# Patient Record
Sex: Male | Born: 1945
Health system: Southern US, Community
[De-identification: ages and names within clinical notes are randomized; demographics above are authoritative.]

## PROBLEM LIST (undated history)

## (undated) DIAGNOSIS — G20A1 Parkinson's disease without dyskinesia, without mention of fluctuations: Secondary | ICD-10-CM

## (undated) DIAGNOSIS — G4730 Sleep apnea, unspecified: Secondary | ICD-10-CM

## (undated) DIAGNOSIS — T63301A Toxic effect of unspecified spider venom, accidental (unintentional), initial encounter: Secondary | ICD-10-CM

## (undated) DIAGNOSIS — C44300 Unspecified malignant neoplasm of skin of unspecified part of face: Secondary | ICD-10-CM

## (undated) DIAGNOSIS — N400 Enlarged prostate without lower urinary tract symptoms: Secondary | ICD-10-CM

## (undated) DIAGNOSIS — N35919 Unspecified urethral stricture, male, unspecified site: Secondary | ICD-10-CM

## (undated) DIAGNOSIS — G20 Parkinson's disease: Secondary | ICD-10-CM

## (undated) HISTORY — DX: Sleep apnea, unspecified: G47.30

## (undated) HISTORY — PX: TRANSURETHRAL RESECTION OF PROSTATE: SHX73

## (undated) HISTORY — DX: Toxic effect of unspecified spider venom, accidental (unintentional), initial encounter: T63.301A

## (undated) HISTORY — DX: Benign prostatic hyperplasia without lower urinary tract symptoms: N40.0

## (undated) HISTORY — PX: KNEE SURGERY: SHX244

## (undated) HISTORY — DX: Unspecified malignant neoplasm of skin of unspecified part of face: C44.300

## (undated) HISTORY — PX: COLONOSCOPY: SHX174

---

## 2006-08-18 ENCOUNTER — Encounter (INDEPENDENT_AMBULATORY_CARE_PROVIDER_SITE_OTHER): Payer: Self-pay | Admitting: Specialist

## 2006-08-18 ENCOUNTER — Ambulatory Visit (HOSPITAL_COMMUNITY): Admission: RE | Admit: 2006-08-18 | Discharge: 2006-08-19 | Payer: Self-pay | Admitting: Urology

## 2007-01-05 ENCOUNTER — Encounter: Admission: RE | Admit: 2007-01-05 | Discharge: 2007-01-05 | Payer: Self-pay | Admitting: Orthopaedic Surgery

## 2007-01-14 ENCOUNTER — Ambulatory Visit (HOSPITAL_BASED_OUTPATIENT_CLINIC_OR_DEPARTMENT_OTHER): Admission: RE | Admit: 2007-01-14 | Discharge: 2007-01-14 | Payer: Self-pay | Admitting: Urology

## 2008-01-09 ENCOUNTER — Encounter: Admission: RE | Admit: 2008-01-09 | Discharge: 2008-01-09 | Payer: Self-pay | Admitting: Orthopaedic Surgery

## 2009-12-26 ENCOUNTER — Encounter: Admission: RE | Admit: 2009-12-26 | Discharge: 2010-03-25 | Payer: Self-pay | Source: Home / Self Care

## 2010-01-03 ENCOUNTER — Ambulatory Visit: Payer: Self-pay | Admitting: Oncology

## 2010-03-13 ENCOUNTER — Ambulatory Visit: Payer: Self-pay | Admitting: Oncology

## 2010-08-04 ENCOUNTER — Other Ambulatory Visit: Payer: Self-pay | Admitting: Dermatology

## 2010-08-19 NOTE — Op Note (Signed)
NAMEKEANON, BEVINS                ACCOUNT NO.:  [id number]   MEDICAL RECORD NO.:  [id number]          PATIENT TYPE:  AMB   LOCATION:  DAY                          FACILITY:  [facility]   PHYSICIAN:  Bertram Millard. Dahlstedt, M.D.DATE OF BIRTH:  1946-03-25   DATE OF PROCEDURE:  08/18/2006  DATE OF DISCHARGE:                               OPERATIVE REPORT   PREOPERATIVE DIAGNOSIS:  Benign prostatic hypertrophy.   POSTOPERATIVE DIAGNOSIS:  Benign prostatic hypertrophy.   PRINCIPAL PROCEDURE:  Transurethral prostatectomy.   SURGEON:  Marcine Matar, MD.   ANESTHESIA:  General with LMA.   COMPLICATIONS:  None.   BRIEF HISTORY:  The patient is a 65 year old gentleman who presents for  definitive management of lower urinary tract symptoms. He has BPH. He  has been on maximum medical therapy, and despite this has significant  lower urinary tract symptomatology. The patient has had urodynamic  studies as well as an ultrasound biopsy of the prostate for an elevated  PSA. This was done back in March. That was negative.   At this point, with him having significant lower urinary tract symptoms,  as well as being on Avodart and Flomax, he requests further therapy to  ameliorate his lower urinary tract symptoms. The risks and complications  of TURP have been discussed with the patient at length. These include,  but are not limited to: Infection, bleeding, retrograde ejaculation,  bladder-neck contracture long term among others. He understands these  and desires to proceed.   DESCRIPTION OF PROCEDURE:  The patient was identified in the holding  area, IV antibiotics were administered, and he was taken to the  operating room where a general anesthetic was administered using the  LMA. He was placed in the dorsal lithotomy position. Genitalia and  perineum were prepped and draped. A 27-French resectoscope sheath was  then placed. Inspection of the bladder revealed no abnormalities.  Ureteral  orifices were carefully inspected. They were well away from the  bladder neck. He had significant bilobar hypertrophy with an overriding  left lobe. There was obvious obstruction.   A resection was begun at the 12 o'clock position, from the neck of the  bladder to the apex of the prostate tissue was resected. I then resected  the whole left lobe down to the surgical capsule. There were a  significant number of bleeders and these were carefully coagulated.  There was a significant amount of apical tissue on that left side as  well. This was taken down being careful to not resect the verumontanum -  - that was used as a landmark for resection. The right lobe was then  taken down from an anterior to posterior direction. There were some  venous sinuses on the right that I got into. These were controlled with  electrocautery. Resection was gain taken down to the surgical capsule.  Apical tissue was carefully resected anteriorly, laterally and  posteriorly. A wide-open fossa was then established.   The bleeders were carefully controlled using the electrocautery. Chips  were evacuated from the bladder, the bladder inspected and found to be  free of chips.  At this point, the fossa was inspected and found to be  hemostatic. The scope was removed, a 22-French Foley with a 30 mL  balloon (3-way) was placed. This was hooked to continuous bladder  irrigation.   The patient tolerated the procedure well. He was awakened and taken to  PACU in stable condition. Chips were sent for pathologic evaluation.      Bertram Millard. Dahlstedt, M.D.  Electronically Signed     SMD/MEDQ  D:  08/18/2006  T:  08/18/2006  Job:  [id number]   cc:   Loraine Leriche A. Perini, M.D.  Fax: [phone]

## 2010-08-19 NOTE — Op Note (Signed)
NAMECARLEN, FILS NO.:  [id number]   MEDICAL RECORD NO.:  [id number]          PATIENT TYPE:  AMB   LOCATION:  [facility]                         FACILITY:  [facility]   PHYSICIAN:  Bertram Millard. Dahlstedt, M.D.DATE OF BIRTH:  April 01, 1946   DATE OF PROCEDURE:  01/14/2007  DATE OF DISCHARGE:                               OPERATIVE REPORT   PREOPERATIVE DIAGNOSIS:  Urethral stricture.   POSTOPERATIVE DIAGNOSIS:  Urethral stricture.   PRINCIPAL PROCEDURE:  Cystoscopy, balloon dilatation of urethral  stricture.   SURGEON:  Bertram Millard. Dahlstedt, M.D.   ANESTHESIA:  General with LMA.   COMPLICATIONS:  None.   BRIEF HISTORY:  Gala Romney is a nice 65 year old gentleman status post TURP in  May of this year.  He has had worsening of his urinary stream since that  time as well as significant urgency, frequency and nocturia.  Recent  cystoscopy revealed a tight urethral stricture.  He presents at this  time for balloon dilatation of that.  Risks and complications have been  discussed with the patient, and he desires to proceed.   DESCRIPTION OF PROCEDURE:  Preoperative IV antibiotics were  administered, and the patient was taken to the operating room.  General  anesthetic was administered using LMA.  He was placed in the dorsal  lithotomy position.  His genitalia and perineum were prepped and draped.  A 22-French panendoscope was passed into his urethra.  There was a very  tight stricture in the bulbous urethra.  I was barely able to navigate a  guidewire through this into the bladder.  Once a good curl was seen in  the bladder with fluoroscopy, the nephrostomy track balloon dilator was  passed over the guidewire.  It was filled with water to 15 atmospheres  of pressure.  It was obviously placed across this, as the proximal end  of it was noted to be in the bladder.  The stricture was dilated for  approximately a minute at 15 atmospheres of pressure.  At this point,  the balloon was  deflated and cystoscopy revealed adequate dilatation.  There was no significant bleeding.  I then dilated the remaining urethra  to 30-French with Sissy Hoff sounds.  At this point, the bladder was  drained.  The procedure terminated.   The patient tolerated the procedure well.  He was awakened and taken to  PACU in stable condition.      Bertram Millard. Dahlstedt, M.D.  Electronically Signed     SMD/MEDQ  D:  01/14/2007  T:  01/15/2007  Job:  [id number]

## 2011-01-15 LAB — POCT HEMOGLOBIN-HEMACUE
Hemoglobin: 15.9
Operator id: 268271

## 2011-11-14 ENCOUNTER — Ambulatory Visit (INDEPENDENT_AMBULATORY_CARE_PROVIDER_SITE_OTHER): Payer: BC Managed Care – PPO | Admitting: Emergency Medicine

## 2011-11-14 VITALS — BP 122/72 | HR 64 | Temp 97.5°F | Resp 16 | Ht 70.0 in | Wt 167.0 lb

## 2011-11-14 DIAGNOSIS — T63331A Toxic effect of venom of brown recluse spider, accidental (unintentional), initial encounter: Secondary | ICD-10-CM

## 2011-11-14 DIAGNOSIS — T6391XA Toxic effect of contact with unspecified venomous animal, accidental (unintentional), initial encounter: Secondary | ICD-10-CM

## 2011-11-14 LAB — POCT CBC
Granulocyte percent: 77 %G (ref 37–80)
Hemoglobin: 15.6 g/dL (ref 14.1–18.1)
Lymph, poc: 1.2 (ref 0.6–3.4)
MCH, POC: 29.8 pg (ref 27–31.2)
MCV: 94.3 fL (ref 80–97)
POC Granulocyte: 6 (ref 2–6.9)
POC LYMPH PERCENT: 15.5 %L (ref 10–50)
Platelet Count, POC: 197 10*3/uL (ref 142–424)
RDW, POC: 14 %
WBC: 7.8 10*3/uL (ref 4.6–10.2)

## 2011-11-14 MED ORDER — SULFAMETHOXAZOLE-TRIMETHOPRIM 800-160 MG PO TABS: 1.0000 | ORAL_TABLET | Freq: Two times a day (BID) | ORAL | Status: AC

## 2011-11-14 NOTE — Progress Notes (Signed)
   Date:  11/14/2011   Name:  Ronald Noble   DOB:  23-Oct-1945   MRN:  [id number] Gender: male  Age: 66 y.o.  PCP:  No primary provider on file.    Chief Complaint: Insect Bite   History of Present Illness:  Ronald Noble is a 66 y.o. pleasant patient who presents with the following:  [place] and four hours later noted that he had an erythematous patch on his left medial lower leg, mid calf.  The lesion was not painful  By this morning the lesion had darkened and enlarged and is now  Approximately 3.5 x 3.5 inches in diameter and violaceous.  Not painful.  The patient is convinced that he was bitten by a brown recluse spider that has been seen in the area previously.  No systemic symptoms.  There is no problem list on file for this patient.   No past medical history on file.  No past surgical history on file.  History  Substance Use Topics  . Smoking status: Never Smoker   . Smokeless tobacco: Not on file  . Alcohol Use: Not on file    No family history on file.  No Known Allergies  Medication list has been reviewed and updated.  Current Outpatient Prescriptions on File Prior to Visit  Medication Sig Dispense Refill  . Carbidopa-Levodopa-Entacapone (STALEVO 100 PO) Take 100 mg by mouth 4 (four) times daily.      Marland Kitchen rOPINIRole (REQUIP XL) 8 MG 24 hr tablet Take 8 mg by mouth 2 (two) times daily.        Review of Systems:  As per HPI, otherwise negative.    Physical Examination: Filed Vitals:   11/14/11 1711  BP: 122/72  Pulse: 64  Temp: 97.5 F (36.4 C)  Resp: 16   Filed Vitals:   11/14/11 1711  Height: 5\' 10"  (1.778 m)  Weight: 167 lb (75.751 kg)   Body mass index is 23.96 kg/(m^2). Ideal Body Weight: Weight in (lb) to have BMI = 25: 173.9    GEN: WDWN, NAD, Non-toxic, Alert & Oriented x 3 HEENT: Atraumatic, Normocephalic.  Ears and Nose: No external deformity. EXTR: No  clubbing/cyanosis/edema NEURO: Normal gait.  PSYCH: Normally interactive. Conversant. Not depressed or anxious appearing.  Calm demeanor.  Leg:  Violaceous circular lesion on medial calf.  No blistering or necrosis.    Assessment and Plan: Manson Passey recluse spider bite Discussed with patient and wife at length the lack of specific treatment for this injury.  They will keep the leg elevated and follow up Monday.  Will start course of septra empirically.  Dr Cleta Alberts was consulted and concurred with the injury and the proposed management plan.  Explained the natural history of this injury may last months prior to resolution.  Carmelina Dane, MD

## 2011-11-15 LAB — COMPREHENSIVE METABOLIC PANEL
Alkaline Phosphatase: 45 U/L (ref 39–117)
BUN: 25 mg/dL — ABNORMAL HIGH (ref 6–23)
CO2: 29 mEq/L (ref 19–32)
Creat: 1.13 mg/dL (ref 0.50–1.35)
Potassium: 4.1 mEq/L (ref 3.5–5.3)
Sodium: 139 mEq/L (ref 135–145)
Total Bilirubin: 0.8 mg/dL (ref 0.3–1.2)

## 2011-11-16 ENCOUNTER — Encounter: Payer: Self-pay | Admitting: Emergency Medicine

## 2011-11-16 ENCOUNTER — Encounter (HOSPITAL_COMMUNITY): Payer: Self-pay | Admitting: Emergency Medicine

## 2011-11-16 ENCOUNTER — Emergency Department (HOSPITAL_COMMUNITY)
Admission: EM | Admit: 2011-11-16 | Discharge: 2011-11-16 | Disposition: A | Discharge status: Home / Self Care | Payer: BC Managed Care – PPO | Attending: Emergency Medicine | Admitting: Emergency Medicine

## 2011-11-16 DIAGNOSIS — G20A1 Parkinson's disease without dyskinesia, without mention of fluctuations: Secondary | ICD-10-CM | POA: Insufficient documentation

## 2011-11-16 DIAGNOSIS — T63301A Toxic effect of unspecified spider venom, accidental (unintentional), initial encounter: Secondary | ICD-10-CM

## 2011-11-16 DIAGNOSIS — S90569A Insect bite (nonvenomous), unspecified ankle, initial encounter: Secondary | ICD-10-CM | POA: Insufficient documentation

## 2011-11-16 DIAGNOSIS — G20 Parkinson's disease: Secondary | ICD-10-CM | POA: Insufficient documentation

## 2011-11-16 HISTORY — DX: Parkinson's disease without dyskinesia, without mention of fluctuations: G20.A1

## 2011-11-16 HISTORY — DX: Parkinson's disease: G20

## 2011-11-16 MED ORDER — BACITRACIN ZINC 500 UNIT/GM EX OINT
TOPICAL_OINTMENT | Freq: Two times a day (BID) | CUTANEOUS | Status: DC
  Administered 2011-11-16: 1 via TOPICAL

## 2011-11-16 NOTE — ED Notes (Signed)
Pt c/o spider bite to right lower leg x 4 days; increased bruising and discoloration with swelling to leg; pt from UC

## 2011-11-16 NOTE — ED Provider Notes (Signed)
History  This chart was scribed for Suzi Roots, MD by Bennett Scrape. This patient was seen in room TR11C/TR11C and the patient's care was started at 11:10AM.  CSN: [id number]  Arrival date & time 11/16/11  1054   None     Chief Complaint  Patient presents with  . Insect Bite     The history is provided by the patient. No language interpreter was used.    Ronald Noble is a 66 y.o. male who presents to the Emergency Department complaining of 4 days of sudden onset, gradually worsening, constant spider bite to the right lower leg that occurred in a cabin in [place]. He reports  bruising and discoloration had initially increased but constant in past 24 hours. He was seen at an urgent care, prescribed Bactrim.  Pt went to dermatologist today who indicated he did not take care of spider/insect bites and referred to ed. . He denies fever, chills, nausea and emesis as associated symptoms. He has a h/o Parkinson's disease. He is an occasional alcohol user but denies smoking.  Pt denies spreading redness, no fever or chills. Does not feel ill. No hx dm.    Past Medical History  Diagnosis Date  . Parkinson disease     History reviewed. No pertinent past surgical history.  History reviewed. No pertinent family history.  History  Substance Use Topics  . Smoking status: Never Smoker   . Smokeless tobacco: Not on file  . Alcohol Use: Yes     occasional      Review of Systems  Constitutional: Negative for fever and chills.  Gastrointestinal: Negative for nausea and vomiting.  Skin: Positive for wound (insect bite to the left lower leg).    Allergies  Review of patient's allergies indicates no known allergies.  Home Medications   Current Outpatient Rx  Name Route Sig Dispense Refill  . ASPIRIN 81 MG PO TABS Oral Take 81 mg by mouth every other day.     . OMEGA-3-ACID ETHYL ESTERS 1 G PO CAPS Oral Take 3 g by mouth daily.    Marland Kitchen PRESCRIPTION MEDICATION Oral Take 1  tablet by mouth daily. Statin.    Marland Kitchen RASAGILINE MESYLATE 0.5 MG PO TABS Oral Take 0.5 mg by mouth daily.    . SULFAMETHOXAZOLE-TRIMETHOPRIM 800-160 MG PO TABS Oral Take 1 tablet by mouth 2 (two) times daily. 20 tablet 0  . STALEVO 100 PO Oral Take 100 mg by mouth 4 (four) times daily.    Marland Kitchen VITAMIN D3 2000 UNITS PO CHEW Oral Chew 2,000 Units by mouth daily.    . B-12 PO Oral Take by mouth daily.    . OMEGA-3 FATTY ACIDS 1000 MG PO CAPS Oral Take 2 g by mouth 3 (three) times daily.    Marland Kitchen ROPINIROLE HCL ER 8 MG PO TB24 Oral Take 8 mg by mouth 2 (two) times daily.      Triage Vitals: BP 132/68  Pulse 71  Temp 98.5 F (36.9 C) (Oral)  Resp 16  SpO2 95%  Physical Exam  Nursing note and vitals reviewed. Constitutional: He is oriented to person, place, and time. He appears well-developed and well-nourished. No distress.  HENT:  Head: Normocephalic and atraumatic.  Eyes: EOM are normal.  Neck: Neck supple. No tracheal deviation present.  Cardiovascular: Normal rate.   Pulmonary/Chest: Effort normal. No respiratory distress.  Musculoskeletal: Normal range of motion.  Neurological: He is alert and oriented to person, place, and time.  Skin: Skin is  warm and dry.       Mildly indurated, erythematous, sl warm area to the left lower leg, medial aspect. Approximately 3-4 in diameter. Skin is intact. No ulcerations. No necrotic areas. No raised borders/areas to lesion. No lymphangitis. Distal pulses palp.   Psychiatric: He has a normal mood and affect. His behavior is normal.    ED Course  Procedures (including critical care time)  DIAGNOSTIC STUDIES: Oxygen Saturation is 95% on room air, adequate by my interpretation.    COORDINATION OF CARE: 11:32AM-Discussed treatment plan which includes with pt at bedside and pt agreed to plan. Advised pt to come back if skin begins to ulcerate or turn black.         MDM  I personally performed the services described in this documentation, which was  scribed in my presence. The recorded information has been reviewed and considered. Suzi Roots, MD  Currently no necrotic tissue or ulceration, skin intact. Discussed possible brown recluse bite vs other spider,insect. No other rash. No EM lesion, no tick bite. No spreading erythema/cellulitis, and currently on abx by urgent care md.  Discussed obs/expectant management, keeping skin very clean, return if worse, spreading redness. Reviewed pictures on pts/spouse phone from yesterday am, appears same/similar.    Suzi Roots, MD 11/16/11 [phone]

## 2011-11-23 ENCOUNTER — Encounter (HOSPITAL_BASED_OUTPATIENT_CLINIC_OR_DEPARTMENT_OTHER): Payer: BC Managed Care – PPO | Attending: Plastic Surgery

## 2011-11-23 DIAGNOSIS — L989 Disorder of the skin and subcutaneous tissue, unspecified: Secondary | ICD-10-CM | POA: Insufficient documentation

## 2011-11-23 DIAGNOSIS — Z7982 Long term (current) use of aspirin: Secondary | ICD-10-CM | POA: Insufficient documentation

## 2011-11-23 DIAGNOSIS — G20A1 Parkinson's disease without dyskinesia, without mention of fluctuations: Secondary | ICD-10-CM | POA: Insufficient documentation

## 2011-11-23 DIAGNOSIS — G20 Parkinson's disease: Secondary | ICD-10-CM | POA: Insufficient documentation

## 2011-11-23 DIAGNOSIS — Z79899 Other long term (current) drug therapy: Secondary | ICD-10-CM | POA: Insufficient documentation

## 2011-11-24 NOTE — Progress Notes (Signed)
[facility]  NAME:  Ronald Noble, Ronald Noble NO.:  [id number]  MEDICAL RECORD NO.:  [id number]      DATE OF BIRTH:  Aug 09, 1945  PHYSICIAN:  Wayland Denis, DO       VISIT DATE:  11/23/2011                                  OFFICE VISIT   HISTORY:  The patient is a 66 year old gentleman who is here for evaluation of a wound on his left lower extremity.  The patient states that 10 days ago, he was out side, had on long pants and socks, and felt something on his legs.  When he went to look at it, it was around a 3 x 3 cm area of red raised skin and he was concerned that it was a spider bite.  He sought out medical care and was given bacitracin and Bactrim. Over the past several days, it seems to have a stage through being red to purple to clearing up some over the past day or two.  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  MEDICATIONS:  Include aspirin, Lipitor, carbidopa, vitamin D3, Klonopin, vitamin B, fish oil, Azilect, Levitra, multivitamin, ReQuip, sulfamethoxazole, albendazole, and bacitracin.  ALLERGIES:  No known drug allergies.  PAST MEDICAL HISTORY:  He has early Parkinson's.  PAST SURGICAL HISTORY:  He has had prostate surgery and a left knee surgery.  FAMILY HISTORY:  He lives with his wife at home, not a smoker.  REVIEW OF SYSTEMS:  Otherwise negative.  PHYSICAL EXAMINATION:  GENERAL:  He is alert, oriented, cooperative, not in any acute distress.  He is pleasant. HEENT:  Pupils are equal.  External muscles intact.  No cervical lymphadenopathy. LUNGS:  Breathing is unlabored. HEART: Regular. ABDOMEN:  Soft.  ASSESSMENT AND PLAN:  The wound is described above.  There is actually no break in the skin.  There is a slight bit of redness and purple area, but overall seems to be healing quite well.  Based on the pictures that his wife had taken over the last several days, we will  continue with the bacitracin, Bactrim, elevation, anti-inflammatories, and have him follow up in my office in 1 week.     Wayland Denis, DO     CS/MEDQ  D:  11/23/2011  T:  11/24/2011  Job:  [id number]

## 2011-12-10 ENCOUNTER — Encounter: Payer: Self-pay | Admitting: Internal Medicine

## 2011-12-16 ENCOUNTER — Encounter: Payer: Self-pay | Admitting: Internal Medicine

## 2011-12-18 ENCOUNTER — Emergency Department (HOSPITAL_COMMUNITY)
Admission: EM | Admit: 2011-12-18 | Discharge: 2011-12-18 | Disposition: A | Discharge status: Home / Self Care | Payer: BC Managed Care – PPO | Source: Home / Self Care

## 2011-12-18 ENCOUNTER — Encounter (HOSPITAL_COMMUNITY): Payer: Self-pay | Admitting: Emergency Medicine

## 2011-12-18 DIAGNOSIS — H6690 Otitis media, unspecified, unspecified ear: Secondary | ICD-10-CM

## 2011-12-18 MED ORDER — AMOXICILLIN 500 MG PO CAPS: 500.0000 mg | ORAL_CAPSULE | Freq: Four times a day (QID) | ORAL | Status: AC

## 2011-12-18 NOTE — ED Notes (Signed)
C/o of bilateral ear pain since Tues. Seen at PMD given drops- no better

## 2011-12-21 ENCOUNTER — Encounter (HOSPITAL_BASED_OUTPATIENT_CLINIC_OR_DEPARTMENT_OTHER): Payer: BC Managed Care – PPO

## 2012-01-05 ENCOUNTER — Encounter: Payer: Self-pay | Admitting: Internal Medicine

## 2012-01-05 ENCOUNTER — Ambulatory Visit (AMBULATORY_SURGERY_CENTER): Payer: BC Managed Care – PPO

## 2012-01-05 VITALS — Ht 71.0 in | Wt 169.9 lb

## 2012-01-05 DIAGNOSIS — Z1211 Encounter for screening for malignant neoplasm of colon: Secondary | ICD-10-CM

## 2012-01-05 MED ORDER — MOVIPREP 100 G PO SOLR: ORAL | Status: DC

## 2012-01-13 ENCOUNTER — Telehealth: Payer: Self-pay | Admitting: Internal Medicine

## 2012-01-13 ENCOUNTER — Ambulatory Visit (AMBULATORY_SURGERY_CENTER): Payer: BC Managed Care – PPO | Admitting: Internal Medicine

## 2012-01-13 ENCOUNTER — Encounter: Payer: Self-pay | Admitting: Internal Medicine

## 2012-01-13 VITALS — BP 118/75 | HR 66 | Temp 97.4°F | Resp 74 | Ht 71.0 in | Wt 169.0 lb

## 2012-01-13 DIAGNOSIS — D126 Benign neoplasm of colon, unspecified: Secondary | ICD-10-CM

## 2012-01-13 DIAGNOSIS — Z1211 Encounter for screening for malignant neoplasm of colon: Secondary | ICD-10-CM

## 2012-01-13 MED ORDER — SODIUM CHLORIDE 0.9 % IV SOLN: 500.0000 mL | INTRAVENOUS | Status: DC

## 2012-01-13 NOTE — Progress Notes (Signed)
Pt given two fleets enemas per Dr Juanda Chance, pt states he had good results with enema.

## 2012-01-13 NOTE — Telephone Encounter (Signed)
Notified Dr.Brodie that patient called and states he drunk ALL the moviprep as directed,lastnight and this morning with the extra fluids and has not had BM since drinking this. He states his las BM was Monday. Verbal order given by Dr.Brodie for patient to take 1 bottle of Magnesium Citrate OTC. Notified patient and he states he will go get this now. Instructed patient to call us back to let us know if this starts working. He understands.

## 2012-01-13 NOTE — Progress Notes (Signed)
Pt encouraged to pass flatus.  Pt rolled to his right side and he is passing some flatus. Maw

## 2012-01-13 NOTE — Progress Notes (Signed)
Patient did not experience any of the following events: a burn prior to discharge; a fall within the facility; wrong site/side/patient/procedure/implant event; or a hospital transfer or hospital admission upon discharge from the facility. (G8907) Patient did not have preoperative order for IV antibiotic SSI prophylaxis. (G8918)  

## 2012-01-13 NOTE — Op Note (Signed)
[facility] [address] [place], [address]   COLONOSCOPY PROCEDURE REPORT  PATIENT: Ronald Noble, Ronald Noble  MR#: [id number] BIRTHDATE: December 22, 1945 , 65  yrs. old GENDER: Male ENDOSCOPIST: Hart Carwin, MD REFERRED BY:  Rodrigo Ran, M.D. PROCEDURE DATE:  01/13/2012 PROCEDURE:   Colonoscopy, screening ASA CLASS:   Class I INDICATIONS:average risk patient for colon cancer and last colon 2003 was normal. MEDICATIONS: MAC sedation, administered by CRNA and Propofol (Diprivan) 500 mg IV  DESCRIPTION OF PROCEDURE:   After the risks and benefits and of the procedure were explained, informed consent was obtained.  A digital rectal exam revealed no abnormalities of the rectum.    The LB [id number]-H180AL [id number]  endoscope was introduced through the anus and advanced to the cecum, which was identified by the ileocecal valve .  The quality of the prep was adequate, using Fleets Enemas .  The instrument was then slowly withdrawn as the colon was fully examined.     COLON FINDINGS: A normal appearing cecum, ileocecal valve, and appendiceal orifice were identified.  The ascending, hepatic flexure, transverse, splenic flexure, descending, sigmoid colon and rectum appeared unremarkable. Diminutive polyp at 5 cm- removed with cold biopsy  No  cancers were seen.     Retroflexed views revealed no abnormalities.     The scope was then withdrawn from the patient and the procedure completed.  COMPLICATIONS: There were no complications. ENDOSCOPIC IMPRESSION: Normal colon , long hypotonic colon, slightly dilated, difficult to traverse, suboptimal prep despite of Movie prep, Mag. citrate and Fleets enemax2, no obstruction diminutive polyp of the rectum removed  RECOMMENDATIONS: 1.  High fiber diet 2.   OTC laxatives prn ( Miralax 17 gm 3x/week)   REPEAT EXAM:   .  recall 5 years due to suboptimal visualization  cc:  _______________________________ eSigned:  Hart Carwin, MD  01/13/2012 4:13 PM     PATIENT NAME:  Ronald Noble, Ronald Noble MR#: [id number]

## 2012-01-13 NOTE — Patient Instructions (Addendum)
Handouts were given to your care partner on polyp and a high fiber diet.  Per Dr. Juanda Chance you may use over the counter laxatives such as miralax 17 gm 3 x per week.  You may resume your current medications today.  Please call if any questions or concerns.   YOU HAD AN ENDOSCOPIC PROCEDURE TODAY AT THE [facility]: Refer to the procedure report that was given to you for any specific questions about what was found during the examination.  If the procedure report does not answer your questions, please call your gastroenterologist to clarify.  If you requested that your care partner not be given the details of your procedure findings, then the procedure report has been included in a sealed envelope for you to review at your convenience later.  YOU SHOULD EXPECT: Some feelings of bloating in the abdomen. Passage of more gas than usual.  Walking can help get rid of the air that was put into your GI tract during the procedure and reduce the bloating. If you had a lower endoscopy (such as a colonoscopy or flexible sigmoidoscopy) you may notice spotting of blood in your stool or on the toilet paper. If you underwent a bowel prep for your procedure, then you may not have a normal bowel movement for a few days.  DIET: Your first meal following the procedure should be a light meal and then it is ok to progress to your normal diet.  A half-sandwich or bowl of soup is an example of a good first meal.  Heavy or fried foods are harder to digest and may make you feel nauseous or bloated.  Likewise meals heavy in dairy and vegetables can cause extra gas to form and this can also increase the bloating.  Drink plenty of fluids but you should avoid alcoholic beverages for 24 hours.  ACTIVITY: Your care partner should take you home directly after the procedure.  You should plan to take it easy, moving slowly for the rest of the day.  You can resume normal activity the day after the procedure however you should NOT  DRIVE or use heavy machinery for 24 hours (because of the sedation medicines used during the test).    SYMPTOMS TO REPORT IMMEDIATELY: A gastroenterologist can be reached at any hour.  During normal business hours, 8:30 AM to 5:00 PM Monday through Friday, call [phone].  After hours and on weekends, please call the GI answering service at [phone] who will take a message and have the physician on call contact you.   Following lower endoscopy (colonoscopy or flexible sigmoidoscopy):  Excessive amounts of blood in the stool  Significant tenderness or worsening of abdominal pains  Swelling of the abdomen that is new, acute  Fever of 100F or higher    FOLLOW UP: If any biopsies were taken you will be contacted by phone or by letter within the next 1-3 weeks.  Call your gastroenterologist if you have not heard about the biopsies in 3 weeks.  Our staff will call the home number listed on your records the next business day following your procedure to check on you and address any questions or concerns that you may have at that time regarding the information given to you following your procedure. This is a courtesy call and so if there is no answer at the home number and we have not heard from you through the emergency physician on call, we will assume that you have returned to your regular  daily activities without incident.  SIGNATURES/CONFIDENTIALITY: You and/or your care partner have signed paperwork which will be entered into your electronic medical record.  These signatures attest to the fact that that the information above on your After Visit Summary has been reviewed and is understood.  Full responsibility of the confidentiality of this discharge information lies with you and/or your care-partner.

## 2012-01-13 NOTE — Progress Notes (Signed)
17:33 The pt said he felt better and was ready to go home.  I encouraged him to keep passing flatus.  I advised him we would call him in the am to check on him. Maw

## 2012-01-13 NOTE — Progress Notes (Signed)
1448 the pt was taken to the rest room and I suggested I given him levsin or a rectal tube.  The pt declined and said he wanted to walk off the gas.  The pt was ambulated by Sherren Kerns, RN.  Two levsin were given sul.  Maw    Pt was taken back to the restroom.  He said he did pass a good amount of gas. Maw

## 2012-01-13 NOTE — Progress Notes (Signed)
The pt was taken to the restroom after 30 mins to see if he can pass more flatus. Maw

## 2012-01-13 NOTE — Telephone Encounter (Signed)
Patient was told to come in for enemas. Per verbal from Dr.Brodie.

## 2012-01-14 ENCOUNTER — Telehealth: Payer: Self-pay | Admitting: *Deleted

## 2012-01-14 NOTE — Telephone Encounter (Signed)
  Follow up Call-  Call back number 01/13/2012  Post procedure Call Back phone  # [phone]  Permission to leave phone message Yes     Patient questions:  Do you have a fever, pain , or abdominal swelling? no Pain Score  0 *  Have you tolerated food without any problems? yes  Have you been able to return to your normal activities? yes  Do you have any questions about your discharge instructions: Diet   no Medications  no Follow up visit  no  Do you have questions or concerns about your Care? no  Actions: * If pain score is 4 or above: No action needed, pain <4.

## 2012-01-19 ENCOUNTER — Encounter: Payer: Self-pay | Admitting: Internal Medicine

## 2012-02-02 ENCOUNTER — Other Ambulatory Visit: Payer: Self-pay | Admitting: Dermatology

## 2012-02-25 ENCOUNTER — Other Ambulatory Visit: Payer: Self-pay | Admitting: Dermatology

## 2012-03-23 DIAGNOSIS — R310 Gross hematuria: Secondary | ICD-10-CM | POA: Diagnosis not present

## 2012-03-23 DIAGNOSIS — N401 Enlarged prostate with lower urinary tract symptoms: Secondary | ICD-10-CM | POA: Diagnosis not present

## 2012-03-23 DIAGNOSIS — N320 Bladder-neck obstruction: Secondary | ICD-10-CM | POA: Diagnosis not present

## 2012-05-25 DIAGNOSIS — G20 Parkinson's disease: Secondary | ICD-10-CM | POA: Diagnosis not present

## 2012-05-25 DIAGNOSIS — G479 Sleep disorder, unspecified: Secondary | ICD-10-CM | POA: Diagnosis not present

## 2012-08-02 DIAGNOSIS — Z85828 Personal history of other malignant neoplasm of skin: Secondary | ICD-10-CM | POA: Diagnosis not present

## 2012-08-02 DIAGNOSIS — T148 Other injury of unspecified body region: Secondary | ICD-10-CM | POA: Diagnosis not present

## 2012-08-02 DIAGNOSIS — B351 Tinea unguium: Secondary | ICD-10-CM | POA: Diagnosis not present

## 2012-08-02 DIAGNOSIS — W57XXXA Bitten or stung by nonvenomous insect and other nonvenomous arthropods, initial encounter: Secondary | ICD-10-CM | POA: Diagnosis not present

## 2012-08-02 DIAGNOSIS — L570 Actinic keratosis: Secondary | ICD-10-CM | POA: Diagnosis not present

## 2012-08-02 DIAGNOSIS — L819 Disorder of pigmentation, unspecified: Secondary | ICD-10-CM | POA: Diagnosis not present

## 2012-08-02 DIAGNOSIS — L821 Other seborrheic keratosis: Secondary | ICD-10-CM | POA: Diagnosis not present

## 2012-08-02 DIAGNOSIS — L578 Other skin changes due to chronic exposure to nonionizing radiation: Secondary | ICD-10-CM | POA: Diagnosis not present

## 2012-08-08 ENCOUNTER — Telehealth: Payer: Self-pay | Admitting: Nurse Practitioner

## 2012-08-08 MED ORDER — CARBIDOPA-LEVODOPA-ENTACAPONE 25-100-200 MG PO TABS: 1.0000 | ORAL_TABLET | Freq: Every day | ORAL | Status: DC

## 2012-08-08 NOTE — Telephone Encounter (Signed)
Patient also requesting the Rx be sent to [facility] pharmacy.

## 2012-08-12 ENCOUNTER — Telehealth: Payer: Self-pay | Admitting: Neurology

## 2012-08-12 NOTE — Telephone Encounter (Signed)
Pt here for medication questions.  He received his medication,  stalevo 100 25-100-200 refill from pharmacy and he thought that this contained the increased dosing that last note from Dr. Sandria Manly stated he would change to on return.  Both he and I reviewed the medications and he is to continue the stalevo (6 x day)  as well as sinemet  10/100 (1/2 tab tid).  He has an appt on 09-27-12 with Dr. Frances Furbish and he will discuss with her then about his future dosing.  He has done well with the regime he is on now.

## 2012-09-27 ENCOUNTER — Encounter: Payer: Self-pay | Admitting: Neurology

## 2012-09-27 ENCOUNTER — Ambulatory Visit (INDEPENDENT_AMBULATORY_CARE_PROVIDER_SITE_OTHER): Payer: Medicare Other | Admitting: Neurology

## 2012-09-27 VITALS — BP 101/61 | HR 73 | Temp 97.8°F | Ht 70.5 in | Wt 163.0 lb

## 2012-09-27 DIAGNOSIS — G20 Parkinson's disease: Secondary | ICD-10-CM | POA: Diagnosis not present

## 2012-09-27 DIAGNOSIS — G20A1 Parkinson's disease without dyskinesia, without mention of fluctuations: Secondary | ICD-10-CM

## 2012-09-27 NOTE — Progress Notes (Signed)
Subjective:    Patient ID: Ronald Noble is a 67 y.o. male.  HPI  Interim history:   Ronald Noble is a 67 year old right-handed gentleman who presents for followup consultation of his Parkinson's disease. He is accompanied by his wife today. This is his first visit with me and he previously followed with Dr. Fayrene Fearing love and was last seen by him on 05/25/2012, at which time Dr. Sandria Manly felt he was stable. But because of of time at 2 hours he increased his carbidopa-levodopa by adding half of a 10-100 mg strength 3 times a day to his regimen. He recommended increasing his Stalevo to 125 mg from 100 mg 6 times a day. The patient has an underlying medical history of skin cancer including melanoma, enlarged prostate status post TURP. His current medications are Stalevo 100 mg 6 times a day, at 7:30, 10, 12:30, 3 PM, 5:30 PM and 8 PM. He is on clonazepam 0.5 mg at night, Requip long-acting 6 mg twice daily, rasagiline 1 mg once daily, carbidopa-levodopa 10-100 mg strength half a tablet 3 times a day.  I reviewed Dr. Imagene Gurney prior notes and the patient's records and below is a summary of that review:  67 year old right-handed gentleman followed since 2008 by Dr. love with a history of Parkinson's disease. He has a history of increased tone and slowness as well as tremor with left-sided predominance. He was initially placed on Requip XL and then rasagiline was added in October 2008. In February 2000 and Stalevo was added. He had low vitamin D levels in 2008. ANA, ESR, ceruloplasmin were normal except for slightly low ceruloplasmin of 16. Urine copper test was negative. Methylmalonic acid was normal. Urine for heavy metals is normal as well. MRI brain with and without contrast on 01/07/2007 was normal. He had a sleep study in October 2008 showing PLMs and evidence of RBD. Repeat sleep study in September 2011 showed supine obstructive sleep apnea and RBD. He had a CPAP titration study in late September 2011 which  was not very successful. He has been on clonazepam at night. He tries to exercise regularly. He has not noticed any compulsive behaviors, memory loss, depression, swelling, or orthostatic dizziness. He recently had a skin biopsy showing atypical cells, concerning for melanoma.  He works full-time as an Pensions consultant, [employer] traffic controller. He exercises regularly and does yoga.   His Past Medical History Is Significant For: Past Medical History  Diagnosis Date  . Parkinson disease   . Sleep apnea     mild  . BPH (benign prostatic hyperplasia)   . Skin cancer of face     arms and back  . Spider bite     brown recluse/ left ankle/08/13    His Past Surgical History Is Significant For: Past Surgical History  Procedure Laterality Date  . Transurethral resection of prostate      2008  . Knee surgery      exploratory/left  . Colonoscopy    . Skin cancer excision      face    His Family History Is Significant For: No family history on file.  His Social History Is Significant For: History   Social History  . Marital Status: Married    Spouse Name: N/A    Number of Children: N/A  . Years of Education: N/A   Social History Main Topics  . Smoking status: Never Smoker   . Smokeless tobacco: Never Used  . Alcohol Use: 3.0 oz/week    3 Glasses  of wine, 2 Cans of beer per week     Comment: occasional  . Drug Use: No  . Sexually Active: None   Other Topics Concern  . None   Social History Narrative  . None    His Allergies Are:  No Known Allergies:   His Current Medications Are:  Outpatient Encounter Prescriptions as of 09/27/2012  Medication Sig Dispense Refill  . aspirin EC 81 MG tablet Take 81 mg by mouth. Take one pill every 3-4 days      . carbidopa-levodopa-entacapone (STALEVO 100) 25-100-200 MG per tablet Take 1 tablet by mouth 6 (six) times daily.  540 tablet  1  . Cholecalciferol (VITAMIN D3) 2000 UNITS CHEW Chew 2,000 Units by mouth daily.      . clonazePAM  (KLONOPIN) 0.5 MG tablet Take 0.5 mg by mouth at bedtime.      . rasagiline (AZILECT) 0.5 MG TABS Take 0.5 mg by mouth daily.      Marland Kitchen rOPINIRole (REQUIP XL) 8 MG 24 hr tablet Take 8 mg by mouth. Take 2 tablets daily      . vardenafil (LEVITRA) 20 MG tablet Take 20 mg by mouth daily as needed.      Marland Kitchen atorvastatin (LIPITOR) 10 MG tablet Take 10 mg by mouth daily.      . Cyanocobalamin (B-12 PO) Take by mouth daily.      . halobetasol (ULTRAVATE) 0.05 % ointment       . Multiple Vitamin (MULTIVITAMIN) tablet Take 1 tablet by mouth daily.      Marland Kitchen neomycin-polymyxin-hydrocortisone (CORTISPORIN) 3.5-10000-1 otic suspension 3 drops 4 (four) times daily.      Marland Kitchen omega-3 acid ethyl esters (LOVAZA) 1 G capsule Take 2 g by mouth as needed.       . [DISCONTINUED] PRESCRIPTION MEDICATION Take 1 tablet by mouth daily. Statin.       No facility-administered encounter medications on file as of 09/27/2012.     Review of Systems  Constitutional: Positive for unexpected weight change (loss).  Respiratory:       Snoring  Psychiatric/Behavioral: Positive for sleep disturbance.       Racing thoughts    Objective:  Neurologic Exam  Physical Exam Physical Examination:   Filed Vitals:   09/27/12 1242  BP: 101/61  Pulse: 73  Temp:     General Examination: The patient is a very pleasant 67 y.o. male in no acute distress.  HEENT: Normocephalic, atraumatic, pupils are equal, round and reactive to light and accommodation. Extraocular tracking shows mild saccadic breakdown without nystagmus noted. There is no limitation to his gaze. There is mild decrease in eye blink rate. Hearing is intact. Face is symmetric with mild facial masking and normal facial sensation. There is no lip, neck or jaw tremor. Neck is moderately rigid with intact passive ROM. There are no carotid bruits on auscultation. Oropharynx exam reveals mild mouth dryness. No significant airway crowding is noted. Mallampati is class II. Tongue  protrudes centrally and palate elevates symmetrically.   There is slight drooling.   Chest: is clear to auscultation without wheezing, rhonchi or crackles noted.  Heart: sounds are regular and normal without murmurs, rubs or gallops noted.   Abdomen: is soft, non-tender and non-distended with normal bowel sounds appreciated on auscultation.  Extremities: There is no pitting edema in the distal lower extremities bilaterally.   Skin: is warm and dry with no trophic changes noted. Age-related changes are noted on the skin and scars from skin  cancer rem.   Musculoskeletal: exam reveals no obvious joint deformities, tenderness, joint swelling or erythema.  Neurologically:  Mental status: The patient is awake and alert, paying good  attention. He is able to completely provide the history. His wife provides additional details. He is oriented to: person, place, time/date, situation, day of week, month of year and year. His memory, attention, language and knowledge are intact. There is no aphasia, agnosia, apraxia or anomia. There is a no significant degree of bradyphrenia. Speech is mildly hypophonic with no dysarthria noted. Mood is congruent and affect is normal.   Cranial nerves are as described above under HEENT exam. In addition, shoulder shrug is normal with equal shoulder height noted.  Motor exam: Normal bulk, and strength for age is noted. There are no dyskinesias noted.   Tone is mildly rigid with presence of cogwheeling in the right upper extremity. There is overall mild bradykinesia. There is no drift or rebound.  There is a mild resting tremor in the right upper extremity. The tremor is intermittent.  Romberg is negative.  Reflexes are 1+ in the upper extremities and 1+ in the lower extremities. Toes are downgoing bilaterally.  Fine motor skills exam: Finger taps are moderately impaired on the right and mildly impaired on the left. Hand movements are mildly impaired on the right and  minimally impaired on the left. RAP (rapid alternating patting) is mildly impaired on the right and mildly impaired on the left. Foot taps are moderately impaired on the right and mildly impaired on the left. Foot agility (in the form of heel stomping) is moderately impaired on the right and mildly impaired on the left.    Cerebellar testing shows no dysmetria or intention tremor on finger to nose testing. Heel to shin is unremarkable bilaterally. There is no truncal or gait ataxia.   Sensory exam is intact to light touch, pinprick, vibration, temperature sense and proprioception in the upper and lower extremities.   Gait, station and balance: He stands up from the seated position with no significant  difficulty and does not need to push up with His hands. He needs no assistance. No veering to one side is noted. He is not noted to lean to the side. Posture is mildly stooped. Stance is narrow-based. He walks with normal pace, decrease in stride length decreased arm swing on the right. He turns in en bloc. Tandem walk is possible. Balance is minimally impaired. He is able to do a toe or heel stance.      Assessment and Plan:   Assessment and Plan:  In summary, Ronald Noble is a very pleasant 67 y.o.-year old male with a history of Parkinson's disease, right-sided predominant. His physical exam is stable and has not progressed in the last 4 months. He is doing fairly well at this time and I reassured the patient in that regard.  I had a long chat with the patient and his wife about His symptoms, my findings and the diagnosis of Parksinson's disease, its prognosis and treatment options. We talked about medical treatments and non-pharmacological approaches. We talked about maintaining a healthy lifestyle in general. I encouraged the patient to eat healthy, exercise daily and keep well hydrated, to keep a scheduled bedtime and wake time routine, to not skip any meals and eat healthy snacks in between  meals and to have protein with every meal. In particular, I stressed the importance of regular exercise, within of course the patient's own mobility limitations and reminded him  to stay well hydrated and not to overdo it.   As far as further diagnostic testing is concerned, I suggested: no change.  As far as medications are concerned, I recommended the following at this time: no change.  he can take carbidopa-levodopa 10-100 mg strength half a pill up to 6 times a day. I did ask him to discuss with his dermatologist the ongoing use of a dopamine agonist in light of his skin cancer history.  I answered all their questions today and the patient and his wife were in agreement with the above outlined plan. I would like to see the patient back in 3 months, sooner if the need arises and encouraged them to call with any interim questions, concerns, problems or updates and refill requests.

## 2012-09-27 NOTE — Patient Instructions (Addendum)
I think overall you are doing fairly well and are stable at this point.   I do have some generic suggestions for you today:   Please make sure that you drink plenty of fluids. I would like for you to exercise daily for example in the form of walking 20-30 minutes every day, if you can. Please keep a regular sleep-wake schedule, keep regular meal times, do not skip any meals, eat  healthy snacks in between meals, such as fruit or nuts. Try to eat protein with every meal.   As far as your medications are concerned, I would like to suggest: no changes. Discuss with your dermatologist the use of Requip with your history of skin cancer.    As far as diagnostic testing, I recommend: no new test.  I do not think we need to make any changes in your medications at this point. I think you're stable enough that I can see you back in 6 months, sooner if we need to. Please call us if you have any interim questions, concerns, or problems or updates to need to discuss.  Brett Canales is my clinical assistant and will answer any of your questions and relay your messages to me and will give you my messages.   Our phone number is [phone]. We also have an after hours call service for urgent matters and there is a physician on-call for urgent questions. For any emergencies you know to call 911 or go to the nearest emergency room.

## 2012-11-02 ENCOUNTER — Telehealth: Payer: Self-pay | Admitting: Neurology

## 2012-11-16 DIAGNOSIS — E559 Vitamin D deficiency, unspecified: Secondary | ICD-10-CM | POA: Diagnosis not present

## 2012-11-16 DIAGNOSIS — G20 Parkinson's disease: Secondary | ICD-10-CM | POA: Diagnosis not present

## 2012-11-16 DIAGNOSIS — Z125 Encounter for screening for malignant neoplasm of prostate: Secondary | ICD-10-CM | POA: Diagnosis not present

## 2012-11-16 DIAGNOSIS — E785 Hyperlipidemia, unspecified: Secondary | ICD-10-CM | POA: Diagnosis not present

## 2012-11-16 DIAGNOSIS — Z0000 Encounter for general adult medical examination without abnormal findings: Secondary | ICD-10-CM | POA: Diagnosis not present

## 2012-11-23 DIAGNOSIS — IMO0002 Reserved for concepts with insufficient information to code with codable children: Secondary | ICD-10-CM | POA: Diagnosis not present

## 2012-11-23 DIAGNOSIS — E785 Hyperlipidemia, unspecified: Secondary | ICD-10-CM | POA: Diagnosis not present

## 2012-11-23 DIAGNOSIS — E559 Vitamin D deficiency, unspecified: Secondary | ICD-10-CM | POA: Diagnosis not present

## 2012-11-23 DIAGNOSIS — Z23 Encounter for immunization: Secondary | ICD-10-CM | POA: Diagnosis not present

## 2012-11-23 DIAGNOSIS — N401 Enlarged prostate with lower urinary tract symptoms: Secondary | ICD-10-CM | POA: Diagnosis not present

## 2012-11-23 DIAGNOSIS — G4733 Obstructive sleep apnea (adult) (pediatric): Secondary | ICD-10-CM | POA: Diagnosis not present

## 2012-11-23 DIAGNOSIS — G20 Parkinson's disease: Secondary | ICD-10-CM | POA: Diagnosis not present

## 2012-11-23 DIAGNOSIS — Z832 Family history of diseases of the blood and blood-forming organs and certain disorders involving the immune mechanism: Secondary | ICD-10-CM | POA: Diagnosis not present

## 2012-11-23 DIAGNOSIS — Z1331 Encounter for screening for depression: Secondary | ICD-10-CM | POA: Diagnosis not present

## 2012-11-23 DIAGNOSIS — C801 Malignant (primary) neoplasm, unspecified: Secondary | ICD-10-CM | POA: Diagnosis not present

## 2012-11-23 DIAGNOSIS — Z0000 Encounter for general adult medical examination without abnormal findings: Secondary | ICD-10-CM | POA: Diagnosis not present

## 2012-11-29 DIAGNOSIS — B351 Tinea unguium: Secondary | ICD-10-CM | POA: Diagnosis not present

## 2012-11-29 DIAGNOSIS — L570 Actinic keratosis: Secondary | ICD-10-CM | POA: Diagnosis not present

## 2012-11-29 DIAGNOSIS — Z85828 Personal history of other malignant neoplasm of skin: Secondary | ICD-10-CM | POA: Diagnosis not present

## 2012-11-29 DIAGNOSIS — Q828 Other specified congenital malformations of skin: Secondary | ICD-10-CM | POA: Diagnosis not present

## 2012-11-29 DIAGNOSIS — D235 Other benign neoplasm of skin of trunk: Secondary | ICD-10-CM | POA: Diagnosis not present

## 2012-12-28 ENCOUNTER — Ambulatory Visit: Payer: Medicare Other | Admitting: Neurology

## 2013-01-02 DIAGNOSIS — N529 Male erectile dysfunction, unspecified: Secondary | ICD-10-CM | POA: Diagnosis not present

## 2013-01-02 DIAGNOSIS — N320 Bladder-neck obstruction: Secondary | ICD-10-CM | POA: Diagnosis not present

## 2013-01-02 DIAGNOSIS — N401 Enlarged prostate with lower urinary tract symptoms: Secondary | ICD-10-CM | POA: Diagnosis not present

## 2013-01-16 ENCOUNTER — Other Ambulatory Visit: Payer: Self-pay

## 2013-01-16 NOTE — Telephone Encounter (Signed)
Pharmacy sent a refill request for Klonopin.  Would you like to fill?  Thank you.

## 2013-01-18 MED ORDER — CLONAZEPAM 0.5 MG PO TABS: 0.5000 mg | ORAL_TABLET | Freq: Every day | ORAL | Status: DC

## 2013-01-18 NOTE — Telephone Encounter (Signed)
Rx signed and mailed.

## 2013-01-30 ENCOUNTER — Encounter: Payer: Self-pay | Admitting: Neurology

## 2013-01-30 ENCOUNTER — Ambulatory Visit (INDEPENDENT_AMBULATORY_CARE_PROVIDER_SITE_OTHER): Payer: Medicare Other | Admitting: Neurology

## 2013-01-30 ENCOUNTER — Telehealth: Payer: Self-pay | Admitting: *Deleted

## 2013-01-30 VITALS — BP 118/67 | HR 59 | Temp 98.1°F | Ht 70.5 in | Wt 159.0 lb

## 2013-01-30 DIAGNOSIS — G20 Parkinson's disease: Secondary | ICD-10-CM | POA: Diagnosis not present

## 2013-01-30 NOTE — Progress Notes (Signed)
Subjective:    Patient ID: Ronald Noble is a 67 y.o. male.  HPI  Interim history:   Mr. Swiatek is a 67 year old right-handed gentleman who presents for followup consultation of his L sided predominant Parkinson's disease. He is accompanied by his wife again today. I first met him on 09/27/2012, which time I did not make any changes to his medication and did not have any new test as he was stable. He recently was seen by Dr. Lorin Picket at [facility]. He has discussed DBS in the past and was not deemed a cadidate for it at the time as he was doing well. He was told by Dr. Lorin Picket to stay on schedule with his medication. 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 Sleep is an issue. He goes to sleep well, but wakes up after 45 minutes to 1 hour. He is seeing a urologist. He exercises regularly.   He previously followed with Dr. Avie Echevaria and was last seen by him on 05/25/2012, at which time Dr. Sandria Manly felt he was stable, but because of off time at 2 hours he increased his carbidopa-levodopa by adding half of a 10-100 mg strength 3 times a day to his regimen. He recommended increasing his Stalevo to 125 mg from 100 mg 6 times a day. The patient has an underlying medical history of skin cancer including melanoma, enlarged prostate status post TURP. His current medications are Stalevo 125 mg 6 times a day, at 7:30, 10, 12:30, 3 PM, 5:30 PM and 8 PM. He is on clonazepam 0.5 mg at night, Requip long-acting 6 mg twice daily, rasagiline 1 mg once daily, carbidopa-levodopa 10-100 mg strength half a tablet 3 times a day, 7:30 to 10, then midday and later in the evening.   He was followed by Dr. Sandria Manly since 2008 with a history of Parkinson's disease. He has a history of increased tone and slowness as well as tremor with R sided predominance. He was initially placed on Requip XL and then rasagiline was added in October 2008. In February 2010 Stalevo was added. He had low  vitamin D levels in 2008. ANA, ESR, ceruloplasmin were normal except for slightly low ceruloplasmin of 16. Urine copper test was negative. Methylmalonic acid was normal. Urine for heavy metals is normal as well. MRI brain with and without contrast on 01/07/2007 was normal. He had a sleep study in October 2008 showing PLMs and evidence of RBD. Repeat sleep study in September 2011 showed supine obstructive sleep apnea and RBD. He had a CPAP titration study in late September 2011 which was not very successful. He has been on clonazepam at night. He tries to exercise regularly. He has not noticed any compulsive behaviors, memory loss, depression, swelling, or orthostatic dizziness. He recently had a skin biopsy showing atypical cells, concerning for melanoma.  He works full-time as an Pensions consultant, Air traffic controller. He exercises regularly and does yoga.   His Past Medical History Is Significant For: Past Medical History  Diagnosis Date  . Parkinson disease   . Sleep apnea     mild  . BPH (benign prostatic hyperplasia)   . Skin cancer of face     arms and back  . Spider bite     brown recluse/ left ankle/08/13    His Past Surgical History Is Significant For: Past Surgical History  Procedure Laterality Date  . Transurethral resection of prostate  2008  . Knee surgery      exploratory/left  . Colonoscopy    . Skin cancer excision      face    His Family History Is Significant For: History reviewed. No pertinent family history.  His Social History Is Significant For: History   Social History  . Marital Status: Married    Spouse Name: N/A    Number of Children: N/A  . Years of Education: N/A   Social History Main Topics  . Smoking status: Never Smoker   . Smokeless tobacco: Never Used  . Alcohol Use: 3.0 oz/week    3 Glasses of wine, 2 Cans of beer per week     Comment: occasional  . Drug Use: No  . Sexual Activity: None   Other Topics Concern  . None   Social History  Narrative  . None    His Allergies Are:  No Known Allergies:   His Current Medications Are:  Outpatient Encounter Prescriptions as of 01/30/2013  Medication Sig Dispense Refill  . aspirin EC 81 MG tablet Take 81 mg by mouth. Take one pill every 3-4 days      . carbidopa-levodopa-entacapone (STALEVO 100) 25-100-200 MG per tablet Take 1 tablet by mouth 6 (six) times daily.  540 tablet  1  . Cholecalciferol (VITAMIN D3) 2000 UNITS CHEW Chew 2,000 Units by mouth daily.      . clonazePAM (KLONOPIN) 0.5 MG tablet Take 1 tablet (0.5 mg total) by mouth at bedtime.  90 tablet  0  . Cyanocobalamin (B-12 PO) Take by mouth daily.      . Multiple Vitamin (MULTIVITAMIN) tablet Take 1 tablet by mouth daily.      . rasagiline (AZILECT) 0.5 MG TABS Take 0.5 mg by mouth daily.      Marland Kitchen rOPINIRole (REQUIP XL) 8 MG 24 hr tablet Take 8 mg by mouth. Take 2 tablets daily      . vardenafil (LEVITRA) 20 MG tablet Take 20 mg by mouth daily as needed.      . [DISCONTINUED] carbidopa-levodopa (SINEMET IR) 10-100 MG per tablet Take 1 tablet by mouth 6 (six) times daily.      Marland Kitchen neomycin-polymyxin-hydrocortisone (CORTISPORIN) 3.5-10000-1 otic suspension 3 drops 4 (four) times daily.      Marland Kitchen omega-3 acid ethyl esters (LOVAZA) 1 G capsule Take 2 g by mouth as needed.       . [DISCONTINUED] atorvastatin (LIPITOR) 10 MG tablet Take 10 mg by mouth daily.      . [DISCONTINUED] halobetasol (ULTRAVATE) 0.05 % ointment        No facility-administered encounter medications on file as of 01/30/2013.   Review of Systems:   Out of a complete 14 point review of systems, all are reviewed and negative with the exception of these symptoms as listed below:   Review of Systems  Constitutional: Negative.   HENT: Negative.   Eyes: Negative.   Respiratory:       Snoring  Cardiovascular: Negative.   Gastrointestinal: Negative.   Endocrine: Negative.   Genitourinary: Negative.   Musculoskeletal: Negative.   Skin: Negative.    Allergic/Immunologic: Negative.   Neurological: Negative.   Hematological: Negative.   Psychiatric/Behavioral: Positive for sleep disturbance.    Objective:  Neurologic Exam  Physical Exam Physical Examination:   Filed Vitals:   01/30/13 1219  BP: 118/67  Pulse: 59  Temp: 98.1 F (36.7 C)   General Examination: The patient is a very pleasant 67 y.o. male in no  acute distress.   HEENT: Normocephalic, atraumatic, pupils are equal, round and reactive to light and accommodation. Extraocular tracking shows mild saccadic breakdown without nystagmus noted. There is no limitation to his gaze. There is mild decrease in eye blink rate. Hearing is intact. Face is symmetric with mild facial masking and normal facial sensation. There is no lip, neck or jaw tremor. Neck is moderately rigid with intact passive ROM. There are no carotid bruits on auscultation. Oropharynx exam reveals mild mouth dryness. No significant airway crowding is noted. Mallampati is class II. Tongue protrudes centrally and palate elevates symmetrically.   There is slight drooling. There are mild generalized dyskinesias.   Chest: is clear to auscultation without wheezing, rhonchi or crackles noted.  Heart: sounds are regular and normal without murmurs, rubs or gallops noted.   Abdomen: is soft, non-tender and non-distended with normal bowel sounds appreciated on auscultation.  Extremities: There is no pitting edema in the distal lower extremities bilaterally.   Skin: is warm and dry with no trophic changes noted. Age-related changes are noted on the skin and scars from skin cancer rem.   Musculoskeletal: exam reveals no obvious joint deformities, tenderness, joint swelling or erythema.  Neurologically:  Mental status: The patient is awake and alert, paying good  attention. He is able to completely provide the history. His wife provides additional details. He is oriented to: person, place, time/date, situation, day of  week, month of year and year. His memory, attention, language and knowledge are intact. There is no aphasia, agnosia, apraxia or anomia. There is a no significant degree of bradyphrenia. Speech is mildly hypophonic with no dysarthria noted. Mood is congruent and affect is normal.   Cranial nerves are as described above under HEENT exam. In addition, shoulder shrug is normal with equal shoulder height noted.  Motor exam: Normal bulk, and strength for age is noted. There are no dyskinesias noted.   Tone is mildly rigid with presence of cogwheeling in the right upper extremity. There is overall mild bradykinesia. There is no drift or rebound.  There is a mild resting tremor in the right upper extremity. The tremor is intermittent.  Romberg is negative.  Reflexes are 1+ in the upper extremities and 1+ in the lower extremities. Toes are downgoing bilaterally.  Fine motor skills exam: Finger taps are moderately impaired on the right and mildly impaired on the left. Hand movements are mildly impaired on the right and minimally impaired on the left. RAP (rapid alternating patting) is mildly impaired on the right and mildly impaired on the left. Foot taps are moderately impaired on the right and mildly impaired on the left. Foot agility (in the form of heel stomping) is moderately impaired on the right and mild to moderately impaired on the left.    Cerebellar testing shows no dysmetria or intention tremor on finger to nose testing. Heel to shin is unremarkable bilaterally. There is no truncal or gait ataxia.   Sensory exam is intact to light touch, pinprick, vibration, temperature sense in the upper and lower extremities.   Gait, station and balance: He stands up from the seated position with no significant difficulty and does not need to push up with His hands. He needs no assistance. No veering to one side is noted. He is not noted to lean to the side. Posture is mildly stooped, advanced for age. Stance is  narrow-based. He walks with normal pace, decrease in stride length decreased arm swing on the right. He turns in  en bloc. Tandem walk is possible. Balance is minimally impaired. He is able to do a toe or heel stance.     Assessment and Plan:   In summary, Dearion Huot is a very pleasant 67 year old male with a history of Parkinson's disease, right-sided predominant, with symptoms going back about 7 years. He has associated RBD and sleep disorder. His physical exam is stable and has progressed mildly in the last 4 months and then noted some mild dyskinesias today. Generally speaking he has been doing well. His memory is stable.  I again had a long chat with the patient and his wife about His symptoms, my findings and the diagnosis of Parksinson's disease, its prognosis and treatment options. We talked about medical treatments and non-pharmacological approaches. We talked about maintaining a healthy lifestyle in general. I encouraged the patient to eat healthy, exercise daily and keep well hydrated, to keep a scheduled bedtime and wake time routine, to not skip any meals and eat healthy snacks in between meals and to have protein with every meal. In particular, I stressed the importance of regular exercise, within of course the patient's own mobility limitations and reminded him to stay well hydrated and not to overdo exercising.   As far as further diagnostic testing is concerned, I suggested: no change. We talked at length about DBS today. While in do not think he needs to pursue this at this time, I think he may be a good candidate down the Road. I explained the procedure to him and the risks and side effects and limitations and expectations. He can discuss further details with Dr. Lorin Picket next time. As far as medications are concerned, I recommended the following at this time: no change. He can take carbidopa-levodopa 10-100 mg strength half a pill up to 3 times a day. I sees his dermatologist  regularly about every 3-4 months.  I answered all their questions today and the patient and his wife were in agreement with the above outlined plan. I would like to see the patient back in 6 months, sooner if the need arises and encouraged them to call with any interim questions, concerns, problems or updates and refill requests. He did not need any Rx today.

## 2013-01-30 NOTE — Patient Instructions (Addendum)
I think your Parkinson's disease has remained fairly stable, which is reassuring. Nevertheless, as you know, this disease does progress with time. It can affect your balance, your memory, your mood, your bowel and bladder function, your posture, balance and walking. Overall you are doing fairly well but I do want to suggest a few things today:  Remember to drink plenty of fluid, eat healthy meals and do not skip any meals. Try to eat protein with a every meal and eat a healthy snack such as fruit or nuts in between meals. Try to keep a regular sleep-wake schedule and try to exercise daily, particularly in the form of walking, 20-30 minutes a day, if you can.   Taking your medication on schedule is key.   Try to stay active physically and mentally. Engage in social activities in your community and with your family and try to keep up with current events by reading the newspaper or watching the news. Try to do word puzzles and you may like to do word puzzles and brain games on the computer such as on [email or web address]   As far as your medications are concerned, I would like to suggest that you take your current medication with the following additional changes: no changes.     As far as diagnostic testing, I will order: no new test.  I would like to see you back in 6 months, sooner if we need to. Please call us with any interim questions, concerns, problems, updates or refill requests.  Please also call us for any test results so we can go over those with you on the phone. Brett Canales is my clinical assistant and will answer any of your questions and relay your messages to me and also relay most of my messages to you.  Our phone number is [phone]. We also have an after hours call service for urgent matters and there is a physician on-call for urgent questions, that cannot wait till the next work day. For any emergencies you know to call 911 or go to the nearest emergency room.

## 2013-02-23 DIAGNOSIS — H04129 Dry eye syndrome of unspecified lacrimal gland: Secondary | ICD-10-CM | POA: Diagnosis not present

## 2013-02-23 DIAGNOSIS — H5200 Hypermetropia, unspecified eye: Secondary | ICD-10-CM | POA: Diagnosis not present

## 2013-02-23 DIAGNOSIS — H43819 Vitreous degeneration, unspecified eye: Secondary | ICD-10-CM | POA: Diagnosis not present

## 2013-02-23 DIAGNOSIS — H16219 Exposure keratoconjunctivitis, unspecified eye: Secondary | ICD-10-CM | POA: Diagnosis not present

## 2013-03-14 ENCOUNTER — Ambulatory Visit: Payer: Medicare Other | Admitting: Neurology

## 2013-03-20 DIAGNOSIS — H1030 Unspecified acute conjunctivitis, unspecified eye: Secondary | ICD-10-CM | POA: Diagnosis not present

## 2013-04-07 ENCOUNTER — Other Ambulatory Visit: Payer: Self-pay

## 2013-04-07 MED ORDER — CARBIDOPA-LEVODOPA-ENTACAPONE 25-100-200 MG PO TABS: 1.0000 | ORAL_TABLET | Freq: Every day | ORAL | Status: DC

## 2013-04-10 ENCOUNTER — Other Ambulatory Visit: Payer: Self-pay

## 2013-04-10 MED ORDER — CLONAZEPAM 0.5 MG PO TABS: 0.5000 mg | ORAL_TABLET | Freq: Every day | ORAL | Status: DC

## 2013-05-03 ENCOUNTER — Other Ambulatory Visit: Payer: Self-pay

## 2013-05-03 MED ORDER — CARBIDOPA-LEVODOPA-ENTACAPONE 25-100-200 MG PO TABS: 1.0000 | ORAL_TABLET | Freq: Every day | ORAL | Status: DC

## 2013-05-25 NOTE — Telephone Encounter (Signed)
Pt came in for apt. Closing encounter

## 2013-06-28 DIAGNOSIS — Z85828 Personal history of other malignant neoplasm of skin: Secondary | ICD-10-CM | POA: Diagnosis not present

## 2013-06-28 DIAGNOSIS — R21 Rash and other nonspecific skin eruption: Secondary | ICD-10-CM | POA: Diagnosis not present

## 2013-06-28 DIAGNOSIS — L259 Unspecified contact dermatitis, unspecified cause: Secondary | ICD-10-CM | POA: Diagnosis not present

## 2013-07-31 ENCOUNTER — Encounter: Payer: Self-pay | Admitting: Neurology

## 2013-07-31 ENCOUNTER — Ambulatory Visit (INDEPENDENT_AMBULATORY_CARE_PROVIDER_SITE_OTHER): Payer: Medicare Other | Admitting: Neurology

## 2013-07-31 VITALS — BP 108/60 | HR 66 | Temp 97.1°F | Ht 70.5 in | Wt 156.0 lb

## 2013-07-31 DIAGNOSIS — G20 Parkinson's disease: Secondary | ICD-10-CM

## 2013-07-31 NOTE — Progress Notes (Signed)
Subjective:    Patient ID: Ronald Noble is a 68 y.o. male.  HPI   Interim history:   Ronald Noble is a 68 year old right-handed gentleman with an underlying medical history of skin cancer including melanoma, enlarged prostate status post TURP who presents for followup consultation of his R sided predominant Parkinson's disease, complicated by RBD and sleep disturbance as well as mild dyskinesias. He is accompanied by his wife again today. I last saw him on 01/30/2013, at which time I felt he was stable. We talked about DBS surgery. I did not make any medication changes. He is aware of the increase risk of melanoma associated with DAs and his dermatologist is aware that he is taking Requip XL.   Today, he reports that he still works FT and he exercises regularly and this includes swimming, yoga, weights. He is due to see [name] once a year. He takes Stalevo 100 mg every 2 1/2 hours starting at 7:30 AM for 6 doses. He stopped the clonazepam, as he did feel some daytime sluggishness. He has not tried Melatonin. He takes C/L 1/2 tid in between. He has no cognitive issues, but does have some "downtime" midday.   I first met him on 09/27/2012, which time I did not make any changes to his medication and did not order any new test as he was stable. He saw [name] at [facility] and discussed DBS in the past but was not deemed a cadidate for it at the time as he was doing well. He was told by [name] to stay on schedule with his medication. 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 Sleep has been an issue. He goes to sleep well, but wakes up after 45 minutes to 1 hour. He is seeing a urologist. He exercises regularly.  He previously followed with [name] and was last seen by him on 05/25/2012, at which time [name] felt he was stable, but because of off time at 2 hours he increased his carbidopa-levodopa by adding half of a 10-100 mg strength 3 times a day to his regimen. He  recommended decreasing his Stalevo to 125 mg from 100 mg 6 times a day. He has been on Stalevo 125 mg 6 times a day, at 7:30, 10, 12:30, 3 PM, 5:30 PM and 8 PM. He is on clonazepam 0.5 mg at night, Requip long-acting 6 mg twice daily, rasagiline 1 mg once daily, carbidopa-levodopa 10-100 mg strength half a tablet 3 times a day, 7:30 to 10, then midday and later in the evening.  He was followed by [name] since 2008 with a history of Parkinson's disease. He has a history of increased tone and slowness as well as tremor with R sided predominance. He was initially placed on Requip XL and then rasagiline was added in October 2008. In February 2010 Stalevo was added. He had low vitamin D levels in 2008. ANA, ESR, ceruloplasmin were normal except for slightly low ceruloplasmin of 16. Urine copper test was negative. Methylmalonic acid was normal. Urine for heavy metals is normal as well. MRI brain with and without contrast on 01/07/2007 was normal. He had a sleep study in October 2008 showing PLMs and evidence of RBD. Repeat sleep study in September 2011 showed supine obstructive sleep apnea and RBD. He had a CPAP titration study in late September 2011 which was not very successful. He has been on clonazepam at night. He has not noticed  any compulsive behaviors, memory loss, depression, swelling, or orthostatic dizziness. He recently had a skin biopsy showing atypical cells, concerning for melanoma.  He works full-time as an Forensic psychologist, [employer]. He exercises regularly and does yoga.   His Past Medical History Is Significant For: Past Medical History  Diagnosis Date  . Parkinson disease   . Sleep apnea     mild  . BPH (benign prostatic hyperplasia)   . Skin cancer of face     arms and back  . Spider bite     brown recluse/ left ankle/08/13    His Past Surgical History Is Significant For: Past Surgical History  Procedure Laterality Date  . Transurethral resection of prostate      2008  .  Knee surgery      exploratory/left  . Colonoscopy    . Skin cancer excision      face    His Family History Is Significant For: No family history on file.  His Social History Is Significant For: History   Social History  . Marital Status: Married    Spouse Name: N/A    Number of Children: N/A  . Years of Education: N/A   Social History Main Topics  . Smoking status: Never Smoker   . Smokeless tobacco: Never Used  . Alcohol Use: 3.0 oz/week    3 Glasses of wine, 2 Cans of beer per week     Comment: occasional  . Drug Use: No  . Sexual Activity: None   Other Topics Concern  . None   Social History Narrative  . None    His Allergies Are:  No Known Allergies:   His Current Medications Are:  Outpatient Encounter Prescriptions as of 07/31/2013  Medication Sig  . aspirin EC 81 MG tablet Take 81 mg by mouth. Take one pill every 3-4 days  . carbidopa-levodopa (SINEMET IR) 10-100 MG per tablet Take 0.5 tablets by mouth daily.  . rasagiline (AZILECT) 0.5 MG TABS Take 0.5 mg by mouth daily.  Marland Kitchen rOPINIRole (REQUIP XL) 8 MG 24 hr tablet Take 8 mg by mouth. Take 2 tablets daily  . vardenafil (LEVITRA) 20 MG tablet Take 20 mg by mouth daily as needed.  . clonazePAM (KLONOPIN) 0.5 MG tablet Take 1 tablet (0.5 mg total) by mouth at bedtime.  . Multiple Vitamin (MULTIVITAMIN) tablet Take 1 tablet by mouth daily.  . [DISCONTINUED] carbidopa-levodopa-entacapone (STALEVO 100) 25-100-200 MG per tablet Take 1 tablet by mouth 6 (six) times daily.  . [DISCONTINUED] Cholecalciferol (VITAMIN D3) 2000 UNITS CHEW Chew 2,000 Units by mouth daily.  . [DISCONTINUED] Cyanocobalamin (B-12 PO) Take by mouth daily.  . [DISCONTINUED] neomycin-polymyxin-hydrocortisone (CORTISPORIN) 3.5-10000-1 otic suspension 3 drops 4 (four) times daily.  . [DISCONTINUED] omega-3 acid ethyl esters (LOVAZA) 1 G capsule Take 2 g by mouth as needed.   :  Review of Systems:  Out of a complete 14 point review of systems,  all are reviewed and negative with the exception of these symptoms as listed below:   Review of Systems  Constitutional: Positive for unexpected weight change.  HENT: Negative.   Eyes: Negative.   Respiratory: Negative.   Cardiovascular: Negative.   Gastrointestinal: Negative.   Endocrine: Negative.   Genitourinary: Negative.   Musculoskeletal: Negative.   Skin: Negative.   Allergic/Immunologic: Negative.   Neurological: Negative.   Hematological: Negative.   Psychiatric/Behavioral: Positive for sleep disturbance (insomnia, frequent waking, acting out dreams).    Objective:  Neurologic Exam  Physical Exam  Physical Examination:   Filed Vitals:   07/31/13 1208  BP: 108/60  Pulse: 66  Temp: 97.1 F (36.2 C)   General Examination: The patient is a very pleasant 68 y.o. male in no acute distress.   HEENT: Normocephalic, atraumatic, pupils are equal, round and reactive to light and accommodation. Extraocular tracking shows mild saccadic breakdown without nystagmus noted. There is no limitation to his gaze. There is mild decrease in eye blink rate. Hearing is intact. Face is symmetric with mild facial masking and normal facial sensation. There is no lip, neck or jaw tremor. Neck is moderately rigid with intact passive ROM. There are no carotid bruits on auscultation. Oropharynx exam reveals mild mouth dryness. No significant airway crowding is noted. Mallampati is class II. Tongue protrudes centrally and palate elevates symmetrically. There is slight drooling. There are mild generalized dyskinesias.   Chest: is clear to auscultation without wheezing, rhonchi or crackles noted.  Heart: sounds are regular and normal without murmurs, rubs or gallops noted.   Abdomen: is soft, non-tender and non-distended with normal bowel sounds appreciated on auscultation.  Extremities: There is no pitting edema in the distal lower extremities bilaterally.   Skin: is warm and dry with no trophic  changes noted. Age-related changes are noted on the skin and scars from skin cancer rem.   Musculoskeletal: exam reveals no obvious joint deformities, tenderness, joint swelling or erythema.  Neurologically:  Mental status: The patient is awake and alert, paying good  attention. He is able to completely provide the history. His wife provides additional details. He is oriented to: person, place, time/date, situation, day of week, month of year and year. His memory, attention, language and knowledge are intact. There is no aphasia, agnosia, apraxia or anomia. There is a no significant degree of bradyphrenia. Speech is mildly hypophonic with no dysarthria noted. Mood is congruent and affect is normal.   Cranial nerves are as described above under HEENT exam. In addition, shoulder shrug is normal with equal shoulder height noted.  Motor exam: Normal bulk, and strength for age is noted. There are no dyskinesias noted.   Tone is mildly rigid with presence of cogwheeling in the right upper extremity. There is overall mild bradykinesia. There is no drift or rebound.  There is a mild resting tremor in the right upper extremity. The tremor is intermittent.  Romberg is negative.  Reflexes are 1+ in the upper extremities and 1+ in the lower extremities. Toes are downgoing bilaterally.  Fine motor skills exam: Finger taps are moderately impaired on the right and mildly impaired on the left. Hand movements are mildly impaired on the right and minimally impaired on the left. RAP (rapid alternating patting) is mildly impaired on the right and mildly impaired on the left. Foot taps are moderately impaired on the right and mild to moderately impaired on the left. Foot agility (in the form of heel stomping) is moderately impaired on the right and mildly impaired on the left.    Cerebellar testing shows no dysmetria or intention tremor on finger to nose testing. Heel to shin is unremarkable bilaterally. There is no  truncal or gait ataxia.   Sensory exam is intact to light touch, pinprick, vibration, temperature sense in the upper and lower extremities.   Gait, station and balance: He stands up from the seated position with no significant difficulty and does not need to push up with His hands. He needs no assistance. No veering to one side is noted. He is  not noted to lean to the side. Posture is mildly stooped, advanced for age. Stance is narrow-based. He walks with normal pace, decrease in stride length decreased arm swing on the right. He turns in en bloc. Tandem walk is possible. Balance is minimally impaired. He is able to do a toe or heel stance.     Assessment and Plan:   In summary, Ronald Noble is a very pleasant 68 year old male with a history of Parkinson's disease, right-sided predominant, with symptoms going back about 7+ years. He has associated RBD and sleep disorder, in particular insomnia. His physical exam is fairly stable and he has mild dyskinesias today. Generally speaking he has been doing well. His memory is stable, but sleep is worse and he has lost a few pounds. I again had a long chat with the patient and his wife about His symptoms, my findings and the diagnosis of Parksinson's disease, its prognosis and treatment options. We talked about medical treatments and non-pharmacological approaches. We talked about maintaining a healthy lifestyle in general. I encouraged the patient to eat healthy, exercise daily and keep well hydrated, to keep a scheduled bedtime and wake time routine, to not skip any meals and eat healthy snacks in between meals and to have protein with every meal. In particular, I stressed the importance of regular exercise, within of course the patient's own mobility limitations and reminded him to stay well hydrated and not to overdo exercising. He is advised to reduce his chocolate intake, had more water, and more regular snacks during the day.  As far as further  diagnostic testing is concerned, I suggested: no change, with the exception of a trial of melatonin at night for sleep. He has an appointment with [name] coming up in June.  As far as medications are concerned, I recommended the following at this time: no change. He can continue to take carbidopa-levodopa 10-100 mg strength half a pill up to 3 times a day. I sees his dermatologist regularly about every 3-4 months and has a history of melanoma but has had reassuring checkups lately. The Requip has not only been helpful for his Parkinson's symptoms but also his restless leg symptoms. We talked about the potential risk of melanoma increase with any dopamine agonist. He's willing to continue with Requip XL, knowing that there is no guarantee that it may not increase his risk for melanoma recurrence. Unfortunately there is no other drug that would take the role of a dopamine agonist in his case. There is a choice we have to make sometimes knowing some of the known risks. He is already on Sinemet and rasagiline and Stalevo. His dermatologist is aware. I answered all their questions today and the patient and his wife were in agreement with the above outlined plan. I would like to see the patient back in 6 months, sooner if the need arises and encouraged them to call with any interim questions, concerns, problems or updates and refill requests. He did not need any Rx today.

## 2013-07-31 NOTE — Patient Instructions (Addendum)
I think your Parkinson's disease has remained fairly stable, which is reassuring. Nevertheless, as you know, this disease does progress with time. It can affect your balance, your memory, your mood, your bowel and bladder function, your posture, balance and walking. Overall you are doing fairly well but I do want to suggest a few things today:  Remember to drink plenty of fluid in the form of water, 6-8 glasses a day, eat healthy meals and do not skip any meals. Try to eat protein with a every meal and eat a healthy snack such as fruit or nuts in between meals. Try to keep a regular sleep-wake schedule and try to exercise daily, particularly in the form of walking, 20-30 minutes a day, if you can.   Taking your medication on schedule is key.   Check with Dr. Silvestre Mesi office regarding a nutritionist.   Try to stay active physically and mentally. Engage in social activities in your community and with your family and try to keep up with current events by reading the newspaper or watching the news. Try to do word puzzles and you may like to do word puzzles and brain games on the computer such as on [email or web address]   As far as your medications are concerned, I would like to suggest that you take your current medication with the following additional changes: no changes. You can try Melatonin at night for sleep: take 3 to 5 mg one hour before your bedtime.   As far as diagnostic testing, I will order: no new test.   I would like to see you back in 6 months, sooner if we need to. Please call us with any interim questions, concerns, problems, updates or refill requests.  Our nursing staff will answer any of your questions and relay your messages to me and also relay most of my messages to you.  Our phone number is [phone]. We also have an after hours call service for urgent matters and there is a physician on-call for urgent questions, that cannot wait till the next work day. For any emergencies you know  to call 911 or go to the nearest emergency room.

## 2013-08-30 DIAGNOSIS — Q828 Other specified congenital malformations of skin: Secondary | ICD-10-CM | POA: Diagnosis not present

## 2013-08-30 DIAGNOSIS — Z85828 Personal history of other malignant neoplasm of skin: Secondary | ICD-10-CM | POA: Diagnosis not present

## 2013-08-30 DIAGNOSIS — L259 Unspecified contact dermatitis, unspecified cause: Secondary | ICD-10-CM | POA: Diagnosis not present

## 2013-09-28 DIAGNOSIS — R5381 Other malaise: Secondary | ICD-10-CM | POA: Diagnosis not present

## 2013-09-28 DIAGNOSIS — T148 Other injury of unspecified body region: Secondary | ICD-10-CM | POA: Diagnosis not present

## 2013-09-28 DIAGNOSIS — R079 Chest pain, unspecified: Secondary | ICD-10-CM | POA: Diagnosis not present

## 2013-09-28 DIAGNOSIS — W57XXXA Bitten or stung by nonvenomous insect and other nonvenomous arthropods, initial encounter: Secondary | ICD-10-CM | POA: Diagnosis not present

## 2013-09-28 DIAGNOSIS — IMO0002 Reserved for concepts with insufficient information to code with codable children: Secondary | ICD-10-CM | POA: Diagnosis not present

## 2013-09-28 DIAGNOSIS — R5383 Other fatigue: Secondary | ICD-10-CM | POA: Diagnosis not present

## 2013-10-04 ENCOUNTER — Telehealth: Payer: Self-pay | Admitting: Neurology

## 2013-10-04 NOTE — Telephone Encounter (Signed)
I called Mr. Ronald Noble and talked to him at length about his recent lyme test results. He has had a long-standing history of tick exposure and while he does not recall a recent tick bite, his dermatologist has seen ticks on him maybe as recent as a year ago. It looks like from the test results that he may actually be having an acute lyme infection for which his primary care physician has put him on oral doxycycline, which pt started 2 days ago. I explained to him that Lyme disease has typically different stages out of which neuroborreliosis is a late stage and occurs months after the exposure and the initial infection. Judging from his IgG antibodies, it does not look like he has had previous infections with only 3/10 IgG Abx positive and diagnostic criteria is 5/10 to be positive.  He may be going through an acute infection at this time. I would not push for CSF testing because the likelihood from lyme to show in the CSF at this stage is very low. I do not think it justifies an invasive test. To be absolutely clear as to what we are dealing with we can certainly request input from infectious diseases but personally I believe he is under appropriate treatment and would benefit from rechecking his Lyme titers in about 3 months from the original testing and after treatment is completed. At that point, his IgG antibodies should be positive and his IgM should have converted to IgG. As far as criteria for acute infection I believe he fulfills criteria for an acute infection, as far as criteria for chronic infection, he at this point does not fulfill criteria for chronic infection. The patient demonstrated understanding and agreement. He declined referral to infectious diseases at this time and is satisfied with current treatment plan with Dr. Joylene Draft.

## 2013-10-04 NOTE — Telephone Encounter (Signed)
Patient has questions about lab results that were sent to [place] from Dr. Abner Greenspan regarding Lyme disease--please call patient.

## 2013-10-04 NOTE — Telephone Encounter (Signed)
Pt information was given to Dr. Rexene Alberts. Please advise previous note. Thanks

## 2013-11-27 ENCOUNTER — Other Ambulatory Visit: Payer: Self-pay | Admitting: Dermatology

## 2013-11-27 DIAGNOSIS — L565 Disseminated superficial actinic porokeratosis (DSAP): Secondary | ICD-10-CM | POA: Diagnosis not present

## 2013-11-27 DIAGNOSIS — L570 Actinic keratosis: Secondary | ICD-10-CM | POA: Diagnosis not present

## 2013-11-27 DIAGNOSIS — C44319 Basal cell carcinoma of skin of other parts of face: Secondary | ICD-10-CM | POA: Diagnosis not present

## 2013-11-27 DIAGNOSIS — D1801 Hemangioma of skin and subcutaneous tissue: Secondary | ICD-10-CM | POA: Diagnosis not present

## 2013-11-27 DIAGNOSIS — B360 Pityriasis versicolor: Secondary | ICD-10-CM | POA: Diagnosis not present

## 2013-11-27 DIAGNOSIS — Z85828 Personal history of other malignant neoplasm of skin: Secondary | ICD-10-CM | POA: Diagnosis not present

## 2013-11-27 DIAGNOSIS — D239 Other benign neoplasm of skin, unspecified: Secondary | ICD-10-CM | POA: Diagnosis not present

## 2013-12-05 DIAGNOSIS — N401 Enlarged prostate with lower urinary tract symptoms: Secondary | ICD-10-CM | POA: Diagnosis not present

## 2013-12-05 DIAGNOSIS — E785 Hyperlipidemia, unspecified: Secondary | ICD-10-CM | POA: Diagnosis not present

## 2013-12-05 DIAGNOSIS — N138 Other obstructive and reflux uropathy: Secondary | ICD-10-CM | POA: Diagnosis not present

## 2013-12-05 DIAGNOSIS — W57XXXA Bitten or stung by nonvenomous insect and other nonvenomous arthropods, initial encounter: Secondary | ICD-10-CM | POA: Diagnosis not present

## 2013-12-05 DIAGNOSIS — E559 Vitamin D deficiency, unspecified: Secondary | ICD-10-CM | POA: Diagnosis not present

## 2013-12-05 DIAGNOSIS — T148 Other injury of unspecified body region: Secondary | ICD-10-CM | POA: Diagnosis not present

## 2013-12-05 DIAGNOSIS — Z125 Encounter for screening for malignant neoplasm of prostate: Secondary | ICD-10-CM | POA: Diagnosis not present

## 2013-12-14 DIAGNOSIS — Z0000 Encounter for general adult medical examination without abnormal findings: Secondary | ICD-10-CM | POA: Diagnosis not present

## 2013-12-14 DIAGNOSIS — R634 Abnormal weight loss: Secondary | ICD-10-CM | POA: Diagnosis not present

## 2013-12-14 DIAGNOSIS — N401 Enlarged prostate with lower urinary tract symptoms: Secondary | ICD-10-CM | POA: Diagnosis not present

## 2013-12-14 DIAGNOSIS — E785 Hyperlipidemia, unspecified: Secondary | ICD-10-CM | POA: Diagnosis not present

## 2013-12-14 DIAGNOSIS — G4733 Obstructive sleep apnea (adult) (pediatric): Secondary | ICD-10-CM | POA: Diagnosis not present

## 2013-12-14 DIAGNOSIS — Z1331 Encounter for screening for depression: Secondary | ICD-10-CM | POA: Diagnosis not present

## 2013-12-14 DIAGNOSIS — N138 Other obstructive and reflux uropathy: Secondary | ICD-10-CM | POA: Diagnosis not present

## 2013-12-14 DIAGNOSIS — A6920 Lyme disease, unspecified: Secondary | ICD-10-CM | POA: Diagnosis not present

## 2013-12-14 DIAGNOSIS — G20 Parkinson's disease: Secondary | ICD-10-CM | POA: Diagnosis not present

## 2013-12-15 DIAGNOSIS — Z1212 Encounter for screening for malignant neoplasm of rectum: Secondary | ICD-10-CM | POA: Diagnosis not present

## 2013-12-17 ENCOUNTER — Encounter: Payer: Self-pay | Admitting: Neurology

## 2013-12-18 ENCOUNTER — Telehealth: Payer: Self-pay | Admitting: Neurology

## 2013-12-18 NOTE — Telephone Encounter (Signed)
Patient calling to speak with a nurse regarding his change of medication and change in condition, please return call and advise.

## 2013-12-18 NOTE — Telephone Encounter (Signed)
Pt calling having some issues with worsening PD.  (medications not working as well as before).   Made RV for tomorrow at 1200.

## 2013-12-19 ENCOUNTER — Ambulatory Visit (INDEPENDENT_AMBULATORY_CARE_PROVIDER_SITE_OTHER): Payer: Medicare Other | Admitting: Neurology

## 2013-12-19 ENCOUNTER — Encounter: Payer: Self-pay | Admitting: Neurology

## 2013-12-19 VITALS — BP 100/60 | HR 58 | Temp 97.7°F | Ht 71.0 in | Wt 155.0 lb

## 2013-12-19 DIAGNOSIS — R894 Abnormal immunological findings in specimens from other organs, systems and tissues: Secondary | ICD-10-CM | POA: Diagnosis not present

## 2013-12-19 DIAGNOSIS — R634 Abnormal weight loss: Secondary | ICD-10-CM | POA: Diagnosis not present

## 2013-12-19 DIAGNOSIS — G20 Parkinson's disease: Secondary | ICD-10-CM | POA: Diagnosis not present

## 2013-12-19 DIAGNOSIS — R768 Other specified abnormal immunological findings in serum: Secondary | ICD-10-CM

## 2013-12-19 MED ORDER — CARBIDOPA-LEVODOPA 25-100 MG PO TBDP: ORAL_TABLET | ORAL | Status: DC

## 2013-12-19 NOTE — Patient Instructions (Addendum)
1. Take your Requip XL 8 mg: 2 pills once daily at bedtime, and try this for about 2 weeks, and may try to switch to 8 AM if not better after 2 weeks.  2. Take the Stalevo at the current times.  3. Take Azilect 1 mg at 7:30 AM daily.  4. We will add Parcopa 25/100 mg 1/2 pill at 11 AM, 2 PM and 7 PM daily for now.  5. Referral to [facility] d/o group, [name] for DBS candidacy.  6. Please stop the Protein supplement/milkshake.  7. You can try Melatonin at night for sleep: take 3 to 5 mg one to 2 hours before your bedtime.

## 2013-12-19 NOTE — Progress Notes (Signed)
Subjective:    Patient ID: Ronald Noble is a 68 y.o. male.  HPI    Interim history:   Ronald Noble is a 67-year-oqld right-handed gentleman with an underlying medical history of skin cancer including melanoma, enlarged prostate status post TURP who presents for followup consultation of his R sided predominant Parkinson's disease, complicated by RBD and sleep disturbance as well as mild dyskinesias. He is accompanied by his wife again today. I last saw him on 07/31/13, at which time [name], including swimming, yoga, and weights. He has been on Stalevo 100 mg every 2 1/2 hours starting at 7:30 AM for 6 doses. He stopped clonazepam, as he felt some daytime sluggishness. He has been taking C/L 1/2 tid in between. He had no cognitive issues, but did have some "downtime" midday. I did not change his medications. He had an appointment with [name] at [facility] in June, but there was a mix up with his appointment and he does not have an appointment until next year.  In the interim, he was diagnosed with Lyme d/s in July. He was tested and treated by his PCP has has been referred to an infectious d/s doctor. He brings in his latest lying test results from 12/05/2013 which I reviewed: It looks like his IgM antibody status is negative but his IgG antibody status has not quite turn positive as far as I understand the report. That means he is probably in the subacute or early chronic phase if I interpret this correctly. He requested a sooner than scheduled appointment, due to feeling more freezing and more off time. This starts in the late morning hours but can also go into the afternoon after lunch. Cognitively and mood wise he feels stable. He does not sleep well at night. He has not consistently tried melatonin I recommended.  Today, he reports that in the last few weeks he felt that his medications not working as well. He has less on time and more off  time and more freezing episodes. Sometimes his facial expression is really decreased and his wife is concerned. She also points out that he has started a new protein milkshake that he drinks first thing in the morning. She shows me the ingredients on the picture she took from the bottle. The first reading as rice protein and is supplemented with a lot of vitamins. He has lost weight in the realm of 25 pounds altogethe in the past several months. In an effort to improve his weight he has added this protein milkshake powder. He reports otherwise no changes in his lifestyle or his medications. His primary care physician has made a referral to a nutritionist as well. He is wondering if he can see another neurologist for DBS evaluation. We have talked about this before.  I saw him on 01/30/2013, at which time I felt he was stable. We talked about DBS surgery. I did not make any medication changes. He is aware of the increase risk of melanoma associated with DAs and his dermatologist is aware that he is taking Requip XL.  I first met him on 09/27/2012, which time I did not make any changes to his medication and did not order any new test as he was stable. He saw [name] at [facility] and discussed DBS in the past but was not deemed a cadidate for it at the time as he was doing well. He was told by [name] to  stay on schedule with his medication. 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 Sleep has been an issue. He goes to sleep well, but wakes up after 45 minutes to 1 hour. He is seeing a urologist. He exercises regularly.  He previously followed with [name] and was last seen by him on 05/25/2012, at which time [name] felt he was stable, but because of off time at 2 hours he increased his carbidopa-levodopa by adding half of a 10-100 mg strength 3 times a day to his regimen. He recommended decreasing his Stalevo to 125 mg from 100 mg 6 times a day. He has been on Stalevo 125 mg 6 times  a day, at 7:30, 10, 12:30, 3 PM, 5:30 PM and 8 PM. He is on clonazepam 0.5 mg at night, Requip long-acting 6 mg twice daily, rasagiline 1 mg once daily, carbidopa-levodopa 10-100 mg strength half a tablet 3 times a day, 7:30 to 10, then midday and later in the evening.  He was followed by [name] since 2008 with a history of Parkinson's disease. He has a history of increased tone and slowness as well as tremor with R sided predominance. He was initially placed on Requip XL and then rasagiline was added in October 2008. In February 2010 Stalevo was added. He had low vitamin D levels in 2008. ANA, ESR, ceruloplasmin were normal except for slightly low ceruloplasmin of 16. Urine copper test was negative. Methylmalonic acid was normal. Urine for heavy metals is normal as well. MRI brain with and without contrast on 01/07/2007 was normal. He had a sleep study in October 2008 showing PLMs and evidence of RBD. Repeat sleep study in September 2011 showed supine obstructive sleep apnea and RBD. He had a CPAP titration study in late September 2011 which was not very successful. He has been on clonazepam at night. He has not noticed any compulsive behaviors, memory loss, depression, swelling, or orthostatic dizziness. He recently had a skin biopsy showing atypical cells, concerning for melanoma.  He works full-time as an Forensic psychologist, [employer]. He exercises regularly and does yoga.   His Past Medical History Is Significant For: Past Medical History  Diagnosis Date  . Parkinson disease   . Sleep apnea     mild  . BPH (benign prostatic hyperplasia)   . Skin cancer of face     arms and back  . Spider bite     brown recluse/ left ankle/08/13    His Past Surgical History Is Significant For: Past Surgical History  Procedure Laterality Date  . Transurethral resection of prostate      2008  . Knee surgery      exploratory/left  . Colonoscopy    . Skin cancer excision      face    His Family  History Is Significant For: No family history on file.  His Social History Is Significant For: History   Social History  . Marital Status: Married    Spouse Name: Levada Dy    Number of Children: 4  . Years of Education: 19   Occupational History  . LAWYER    Social History Main Topics  . Smoking status: Never Smoker   . Smokeless tobacco: Never Used  . Alcohol Use: 3.0 oz/week    3 Glasses of wine, 2 Cans of beer per week     Comment: occasional  . Drug Use: No  . Sexual Activity: None  Other Topics Concern  . None   Social History Narrative   Patient is right handed and resides with wife    His Allergies Are:  No Known Allergies:   His Current Medications Are:  Outpatient Encounter Prescriptions as of 12/19/2013  Medication Sig  . aspirin EC 81 MG tablet Take 81 mg by mouth. Take one pill every 3-4 days  . carbidopa-levodopa (SINEMET IR) 10-100 MG per tablet Take 0.5 tablets by mouth daily.  . clonazePAM (KLONOPIN) 0.5 MG tablet Take 1 tablet (0.5 mg total) by mouth at bedtime.  . Multiple Vitamin (MULTIVITAMIN) tablet Take 1 tablet by mouth daily.  . rasagiline (AZILECT) 0.5 MG TABS Take 0.5 mg by mouth daily.  Marland Kitchen rOPINIRole (REQUIP XL) 8 MG 24 hr tablet Take 8 mg by mouth. Take 2 tablets daily  . vardenafil (LEVITRA) 20 MG tablet Take 20 mg by mouth daily as needed.  :  Review of Systems:  Out of a complete 14 point review of systems, all are reviewed and negative with the exception of these symptoms as listed below:  Review of Systems  Genitourinary: Positive for urgency and frequency.  Skin:       Skin cancer-basil,squam cell  Neurological:       Insomnia,frequent waking, daytime sleepiness, snoring, acting out dreams    Objective:  Neurologic Exam  Physical Exam Physical Examination:   Filed Vitals:   12/19/13 1220  BP: 100/60  Pulse: 58  Temp: 97.7 F (36.5 C)   General Examination: The patient is a very pleasant 68 y.o. male in no acute  distress.   HEENT: Normocephalic, atraumatic, pupils are equal, round and reactive to light and accommodation. Extraocular tracking shows mild saccadic breakdown without nystagmus noted. There is no limitation to his gaze. There is mild decrease in eye blink rate. Hearing is intact. Face is symmetric with mild facial masking and normal facial sensation. There is no lip, neck or jaw tremor. Neck is moderately rigid with intact passive ROM. There are no carotid bruits on auscultation. Oropharynx exam reveals mild mouth dryness. No significant airway crowding is noted. Mallampati is class II. Tongue protrudes centrally and palate elevates symmetrically. There is slight drooling. There are mild generalized dyskinesias.   Chest: is clear to auscultation without wheezing, rhonchi or crackles noted.  Heart: sounds are regular and normal without murmurs, rubs or gallops noted.   Abdomen: is soft, non-tender and non-distended with normal bowel sounds appreciated on auscultation.  Extremities: There is no pitting edema in the distal lower extremities bilaterally.   Skin: is warm and dry with no trophic changes noted. Age-related changes are noted on the skin and scars from skin cancer rem.   Musculoskeletal: exam reveals no obvious joint deformities, tenderness, joint swelling or erythema.  Neurologically:  Mental status: The patient is awake and alert, paying good  attention. He is able to completely provide the history. His wife supplements with details. He is oriented to: person, place, time/date, situation, day of week, month of year and year. His memory, attention, language and knowledge are intact. There is no aphasia, agnosia, apraxia or anomia. There is a no significant degree of bradyphrenia. Speech is mildly hypophonic with no dysarthria noted. Mood is congruent and affect is normal.   Cranial nerves are as described above under HEENT exam. In addition, shoulder shrug is normal with equal shoulder  height noted.  Motor exam: Normal bulk, and strength for age is noted. There are mild generalized dyskinesias noted.  Tone is mildly rigid with presence of cogwheeling in the right upper extremity. There is overall mild bradykinesia. There is no drift or rebound.  There is a mild resting tremor in the right upper extremity. The tremor is intermittent.  Romberg is negative.  Reflexes are 1+ in the upper extremities and 1+ in the lower extremities. Toes are downgoing bilaterally.  Fine motor skills exam: Finger taps are moderately impaired on the right and mildly impaired on the left. Hand movements are mildly impaired on the right and minimally impaired on the left. RAP (rapid alternating patting) is mildly impaired on the right and mildly impaired on the left. Foot taps are moderately impaired on the right and mild to moderately impaired on the left. Foot agility (in the form of heel stomping) is moderately impaired on the right and mildly impaired on the left.    Cerebellar testing shows no dysmetria or intention tremor on finger to nose testing. Heel to shin is unremarkable bilaterally. There is no truncal or gait ataxia.   Sensory exam is intact to light touch, pinprick, vibration, temperature sense in the upper and lower extremities.   Gait, station and balance: He stands up from the seated position with no significant difficulty and does not need to push up with His hands. He needs no assistance. No veering to one side is noted. He is not noted to lean to the side. Posture is mildly stooped, advanced for age. Stance is narrow-based. He walks with normal pace, decrease in stride length decreased arm swing on the right. He turns in en bloc. Tandem walk is not tested today and he does not have any freezing episodes. Balance is minimally impaired.     Assessment and Plan:   In summary, Ronald Noble is a very pleasant 68 year old male with a history of  right-sided predominant Parkinson's  disease, complicated by RBD, sleep disturbance, generalized dyskinesias, motor fluctuations, and also chronic constipation. Today I made several suggestions. I would like for him to take his Requip XL altogether at night. He takes a total of 60 mg at this time. He will take as like 1 mg once daily in the morning. He will continue with Stalevo for a total of 6 doses every 2 and half hours starting at 7:30 AM. He is advised to stop his protein powder supplements. It may be interfering with the absorption of the Stalevo and his levodopa. In view of generic levodopa 10-100 mg half a pill as needed I suggested a trial of Parcopa 25-100 mg half a pill scheduled 3 times a day, namely at 11 AM, 2 PM and 7 PM. If he tries this consistently he may be able to tell the difference. We will not try any prescription medication for constipation quite yet. He is advised to increase his water intake, walk regularly, and use over-the-counter medications such as Dulcolax if needed or MiraLax as needed. He is advised not to over do his exercising. He may be burning too much calories at this time. I think it may be worthwhile if he meets with a nutritionist. I encouraged him to make that appointment as recommended by his primary care physician. He is also encouraged to proceed with a consultation with infectious diseases. As far as DBS evaluation. I suggested referral to [facility] to [name]. Overall, for me he has remained fairly stable which is reassuring. He had no freezing on exam today. I again had a long chat with  the patient and his wife about His symptoms, my findings and the diagnosis of Parksinson's disease, its prognosis and treatment options. We talked about medical treatments and non-pharmacological approaches. We talked about maintaining a healthy lifestyle in general. I encouraged the patient to eat healthy, exercise daily and keep well hydrated, to keep a scheduled bedtime and wake  time routine, to not skip any meals and eat healthy.  I sees his dermatologist regularly about every 3-4 months and has a history of melanoma but has had reassuring checkups lately. The Requip has not only been helpful for his Parkinson's symptoms but also his restless leg symptoms. We had previously talked about the potential risk of melanoma increase with any dopamine agonist. I answered all their questions today and the patient and his wife were in agreement with the above outlined pla he has a previously scheduled appointment with me next month and he is advised to keep it. I only provided him with one new prescription for Parcopa today. I made a referral to Dr. Siddiqui.  Most of my 40 minute visit today was spent in counseling and coordination of care, reviewing test results and reviewing medication.  

## 2013-12-28 DIAGNOSIS — Z1212 Encounter for screening for malignant neoplasm of rectum: Secondary | ICD-10-CM | POA: Diagnosis not present

## 2014-01-02 DIAGNOSIS — Z23 Encounter for immunization: Secondary | ICD-10-CM | POA: Diagnosis not present

## 2014-01-04 HISTORY — PX: SKIN CANCER EXCISION: SHX779

## 2014-01-15 DIAGNOSIS — Z85828 Personal history of other malignant neoplasm of skin: Secondary | ICD-10-CM | POA: Diagnosis not present

## 2014-01-15 DIAGNOSIS — C44319 Basal cell carcinoma of skin of other parts of face: Secondary | ICD-10-CM | POA: Diagnosis not present

## 2014-01-19 ENCOUNTER — Other Ambulatory Visit: Payer: Self-pay

## 2014-01-30 ENCOUNTER — Ambulatory Visit: Payer: Medicare Other | Admitting: Neurology

## 2014-01-30 ENCOUNTER — Telehealth: Payer: Self-pay | Admitting: Neurology

## 2014-01-30 NOTE — Telephone Encounter (Signed)
Patient no showed for an appointment today, 01/30/2014, at 1200.

## 2014-01-31 ENCOUNTER — Encounter: Payer: Self-pay | Admitting: Neurology

## 2014-02-01 ENCOUNTER — Encounter: Payer: Self-pay | Admitting: Neurology

## 2014-02-01 ENCOUNTER — Ambulatory Visit (INDEPENDENT_AMBULATORY_CARE_PROVIDER_SITE_OTHER): Payer: Medicare Other | Admitting: Neurology

## 2014-02-01 VITALS — BP 113/63 | HR 65 | Temp 97.1°F | Ht 71.0 in | Wt 159.0 lb

## 2014-02-01 DIAGNOSIS — G20 Parkinson's disease: Secondary | ICD-10-CM | POA: Diagnosis not present

## 2014-02-01 DIAGNOSIS — R3915 Urgency of urination: Secondary | ICD-10-CM | POA: Diagnosis not present

## 2014-02-01 DIAGNOSIS — G479 Sleep disorder, unspecified: Secondary | ICD-10-CM | POA: Diagnosis not present

## 2014-02-01 MED ORDER — SOLIFENACIN SUCCINATE 5 MG PO TABS: 5.0000 mg | ORAL_TABLET | Freq: Every day | ORAL | Status: DC

## 2014-02-01 NOTE — Progress Notes (Signed)
Subjective:    Patient ID: Ronald Noble is a 68 y.o. male.  HPI    Interim history:   Ronald Noble is a 67-year-oqld right-handed gentleman with an underlying medical history of skin cancer including melanoma, enlarged prostate status post TURP who presents for followup consultation of his R sided predominant Parkinson's disease, complicated by RBD and sleep disturbance as well as mild dyskinesias. He is accompanied by his wife again today. Of note, he did not show for an appointment on 01/30/2014. I last saw him on 12/19/2013, at which time he reported that his medication was not working as well. He reported more freezing episodes and more off time. Of note, he had been using a new protein milkshake that he was drinking first thing in the morning. He had lost some weight and in an effort to improve his weight loss he had started this new supplements. His primary care physician had referred him to a nutritionist. We also talked about DBS evaluation and he requested to see another specialist for DBS candidacy. I referred him to Dr. Linus Noble at [facility] I also advised him to discontinue using the new protein supplement as I felt it may be interfering with the absorption of his Stalevo. I also suggested a trial of Parcopa half a pill 3 times a day at 11, 2 PM and 7 PM and we kept the Stalevo the same. He was advised to use MiraLAX as needed for constipation.  Today, he reports that staying asleep has been a big problem for him. He has not consistently tried melatonin. He has urinary urgency and his urologist suggested kegel exercises and he has a FU in 12/15. He will see Dr. Estrella Noble. His PCP talked to ID specialist and he was told, he did not have Lyme.   I saw him on 07/31/13, at which time [name], including swimming, yoga, and weights. He has been on Stalevo 100 mg every 2 1/2 hours starting at 7:30 AM for 6 doses. He stopped  clonazepam, as he felt some daytime sluggishness. He has been taking C/L 1/2 tid in between. He had no cognitive issues, but did have some "downtime" midday. I did not change his medications. He had an appointment with Dr. Nicki Noble at [facility] in June, but there was a mix up with his appointment and he does not have an appointment until next year.  In the interim, he was diagnosed with Lyme d/s in July. He was tested and treated by his PCP has has been referred to an infectious d/s doctor. He brings in his latest lying test results from 12/05/2013 which I reviewed: It looks like his IgM antibody status is negative but his IgG antibody status has not quite turn positive as far as I understand the report. That means he is probably in the subacute or early chronic phase if I interpret this correctly. He requested a sooner than scheduled appointment, due to feeling more freezing and more off time. This starts in the late morning hours but can also go into the afternoon after lunch. Cognitively and mood wise he feels stable. He does not sleep well at night. He has not consistently tried melatonin I recommended.  I saw him on 01/30/2013, at which time I felt he was stable. We talked about DBS surgery. I did not make any medication changes. He is aware of the increase risk of melanoma associated with DAs and his dermatologist is  aware that he is taking Requip XL.  I first met him on 09/27/2012, which time I did not make any changes to his medication and did not order any new test as he was stable. He saw Dr. Nicki Noble at [facility] and discussed DBS in the past but was not deemed a cadidate for it at the time as he was doing well. He was told by Dr. Nicki Noble to stay on schedule with his medication. 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 Sleep has been an issue. He goes to sleep well, but wakes up after 45 minutes to 1 hour. He is seeing a urologist. He exercises regularly.  He previously followed with Dr. Morene Noble and was last seen by him on 05/25/2012, at which time Dr. Erling Noble felt he was stable, but because of off time at 2 hours he increased his carbidopa-levodopa by adding half of a 10-100 mg strength 3 times a day to his regimen. He recommended decreasing his Stalevo to 125 mg from 100 mg 6 times a day. He has been on Stalevo 125 mg 6 times a day, at 7:30, 10, 12:30, 3 PM, 5:30 PM and 8 PM. He is on clonazepam 0.5 mg at night, Requip long-acting 6 mg twice daily, rasagiline 1 mg once daily, carbidopa-levodopa 10-100 mg strength half a tablet 3 times a day, 7:30 to 10, then midday and later in the evening.  He was followed by Dr. Erling Noble since 2008 with a history of Parkinson's disease. He has a history of increased tone and slowness as well as tremor with R sided predominance. He was initially placed on Requip XL and then rasagiline was added in October 2008. In February 2010 Stalevo was added. He had low vitamin D levels in 2008. ANA, ESR, ceruloplasmin were normal except for slightly low ceruloplasmin of 16. Urine copper test was negative. Methylmalonic acid was normal. Urine for heavy metals is normal as well. MRI brain with and without contrast on 01/07/2007 was normal. He had a sleep study in October 2008 showing PLMs and evidence of RBD. Repeat sleep study in September 2011 showed supine obstructive sleep apnea and RBD. He had a CPAP titration study in late September 2011 which was not very successful. He has been on clonazepam at night. He has not noticed any compulsive behaviors, memory loss, depression, swelling, or orthostatic dizziness. He recently had a skin biopsy showing atypical cells, concerning for melanoma.  He works full-time as an Forensic psychologist, [employer]. He exercises regularly and does yoga.   His Past Medical History Is Significant For: Past Medical History  Diagnosis Date  . Parkinson disease   . Sleep apnea     mild  . BPH (benign prostatic hyperplasia)   . Skin cancer of face      arms and back  . Spider bite     brown recluse/ left ankle/08/13    His Past Surgical History Is Significant For: Past Surgical History  Procedure Laterality Date  . Transurethral resection of prostate      2008  . Knee surgery      exploratory/left  . Colonoscopy    . Skin cancer excision  01/2014    face    His Family History Is Significant For: Family History  Problem Relation Age of Onset  . COPD Mother   . Cancer Father     His Social History Is Significant For: History   Social History  .  Marital Status: Married    Spouse Name: Levada Dy    Number of Children: 4  . Years of Education: 19   Occupational History  . LAWYER    Social History Main Topics  . Smoking status: Never Smoker   . Smokeless tobacco: Never Used  . Alcohol Use: 3.0 oz/week    3 Glasses of wine, 2 Cans of beer per week     Comment: occasional  . Drug Use: No  . Sexual Activity: None   Other Topics Concern  . None   Social History Narrative   Patient is right handed and resides with wife    His Allergies Are:  No Known Allergies:   His Current Medications Are:  Outpatient Encounter Prescriptions as of 02/01/2014  Medication Sig  . aspirin EC 81 MG tablet Take 81 mg by mouth. Take one pill every 3-4 days  . carbidopa-levodopa (PARCOPA) 25-100 MG per disintegrating tablet 1/2 pill dissolved in your mouth at 11 AM, 2 PM, and 7 PM  . fluorouracil (EFUDEX) 5 % cream   . ketoconazole (NIZORAL) 2 % cream   . rasagiline (AZILECT) 0.5 MG TABS Take 0.5 mg by mouth daily.  Marland Kitchen rOPINIRole (REQUIP XL) 8 MG 24 hr tablet Take 8 mg by mouth. Take 2 tablets daily  . vardenafil (LEVITRA) 20 MG tablet Take 20 mg by mouth daily as needed.  :  Review of Systems:  Out of a complete 14 point review of systems, all are reviewed and negative with the exception of these symptoms as listed below:   Review of Systems  Genitourinary: Positive for urgency.       Frequency of urination  Skin:        Moles(surgery)  Neurological:       Insomnia,frequent waking snoring    Objective:  Neurologic Exam  Physical Exam Physical Examination:   Filed Vitals:   02/01/14 1352  BP: 113/63  Pulse: 65  Temp: 97.1 F (36.2 C)   General Examination: The patient is a very pleasant 68 y.o. male in no acute distress.   HEENT: Normocephalic, atraumatic, pupils are equal, round and reactive to light and accommodation. Extraocular tracking shows mild saccadic breakdown without nystagmus noted. There is no limitation to his gaze. There is mild decrease in eye blink rate. Hearing is intact. Face is symmetric with mild facial masking and normal facial sensation. There is no lip, neck or jaw tremor. Neck is moderately rigid with intact passive ROM. There are no carotid bruits on auscultation. Oropharynx exam reveals mild mouth dryness. No significant airway crowding is noted. Mallampati is class II. Tongue protrudes centrally and palate elevates symmetrically. There is no drooling. There are mild generalized dyskinesias.   Chest: is clear to auscultation without wheezing, rhonchi or crackles noted.  Heart: sounds are regular and normal without murmurs, rubs or gallops noted.   Abdomen: is soft, non-tender and non-distended with normal bowel sounds appreciated on auscultation.  Extremities: There is no pitting edema in the distal lower extremities bilaterally.   Skin: is warm and dry with no trophic changes noted. Age-related changes are noted on the skin and scars from skin cancer rem.   Musculoskeletal: exam reveals no obvious joint deformities, tenderness, joint swelling or erythema.  Neurologically:  Mental status: The patient is awake and alert, paying good  attention. He is able to completely provide the history. He is oriented to: person, place, time/date, situation, day of week, month of year and year. His memory, attention, language  and knowledge are intact. There is no aphasia, agnosia,  apraxia or anomia. There is a no significant degree of bradyphrenia. Speech is mildly hypophonic with no dysarthria noted. Mood is congruent and affect is normal.   Cranial nerves are as described above under HEENT exam. In addition, shoulder shrug is normal with equal shoulder height noted.  Motor exam: Normal bulk, and strength for age is noted. There are mild generalized dyskinesias noted, mostly in the RLE.   Tone is mildly rigid with presence of cogwheeling in the right upper extremity. There is overall mild bradykinesia. There is no drift or rebound.  There is a mild resting tremor in the right upper extremity. The tremor is intermittent.  Romberg is negative.  Reflexes are 1+ in the upper extremities and 1+ in the lower extremities. Toes are downgoing bilaterally.  Fine motor skills exam: Finger taps are moderately impaired on the right and mildly impaired on the left. Hand movements are mildly impaired on the right and minimally impaired on the left. RAP (rapid alternating patting) is mildly impaired on the right and mildly impaired on the left. Foot taps are moderately impaired on the right and mild to moderately impaired on the left. Foot agility (in the form of heel stomping) is moderately impaired on the right and mild to moderately impaired on the left.    Cerebellar testing shows no dysmetria or intention tremor on finger to nose testing. Heel to shin is unremarkable bilaterally. There is no truncal or gait ataxia.   Sensory exam is intact to light touch.   Gait, station and balance: He stands up from the seated position with no significant difficulty and does not need to push up with His hands. He needs no assistance. No veering to one side is noted. He is not noted to lean to the side. Posture is mildly stooped, advanced for age. Stance is narrow-based. He walks with normal pace, good stride length decreased arm swing on the right. He turns in en bloc. Tandem walk is not tested today  and he does not have any freezing episodes. Balance is minimally impaired.     Assessment and Plan:   In summary, Ronald Noble is a very pleasant 68 year old male with a history of right-sided predominant Parkinson's disease, complicated by RBD, sleep disturbance, generalized dyskinesias, motor fluctuations, constipation and bladder hyperactivity. Today,  I suggested we keep the Stalevo and Parcopa the same. For his sleep I asked him to consistently try melatonin 5-10 mg, 1-2 hours before bedtime. In the past he tried clonazepam primarily because he had REM behavior disorder, but he did not want to take it regularly. His RBD improved over time. He is advised to continue with Azilect and Requip long-acting as well. For his bladder I suggested we try him on low-dose Vesicare. I asked him to take 5 mg each night. We talked about potential side effects. I provided him with a new prescription. He will see his urologist in December and this would be a good time to have his bladder hyperactivity recheck. He does endorse that he has not been doing the exercises as advised by his urologist. He is trying to drink more water and eat non-processed food. He has also eliminated gluten from his diet. His weight is stable. Overall his exam is fairly stable. He has an appointment with Dr. Linus Noble next month. I will see him back in about 3-4 months. I answered all his questions today and the patient was in agreement.

## 2014-02-01 NOTE — Patient Instructions (Addendum)
  You can try Melatonin at night for sleep: take 5 to 10 mg one to 2 hours before your bedtime.  We will try vesicare 5 mg each night for your bladder. It may cause sedation, off balance, drowsiness and dry mouth, blurry vision.

## 2014-02-07 ENCOUNTER — Telehealth: Payer: Self-pay | Admitting: *Deleted

## 2014-02-07 ENCOUNTER — Encounter (HOSPITAL_COMMUNITY): Payer: Self-pay | Admitting: Emergency Medicine

## 2014-02-07 ENCOUNTER — Emergency Department (HOSPITAL_COMMUNITY)
Admission: EM | Admit: 2014-02-07 | Discharge: 2014-02-07 | Disposition: A | Discharge status: Home / Self Care | Payer: Medicare Other | Attending: Emergency Medicine | Admitting: Emergency Medicine

## 2014-02-07 DIAGNOSIS — Z87448 Personal history of other diseases of urinary system: Secondary | ICD-10-CM | POA: Insufficient documentation

## 2014-02-07 DIAGNOSIS — Z85828 Personal history of other malignant neoplasm of skin: Secondary | ICD-10-CM | POA: Diagnosis not present

## 2014-02-07 DIAGNOSIS — R34 Anuria and oliguria: Secondary | ICD-10-CM | POA: Insufficient documentation

## 2014-02-07 DIAGNOSIS — Z9889 Other specified postprocedural states: Secondary | ICD-10-CM | POA: Insufficient documentation

## 2014-02-07 DIAGNOSIS — R319 Hematuria, unspecified: Secondary | ICD-10-CM | POA: Insufficient documentation

## 2014-02-07 DIAGNOSIS — G20 Parkinson's disease: Secondary | ICD-10-CM | POA: Diagnosis not present

## 2014-02-07 DIAGNOSIS — Z87828 Personal history of other (healed) physical injury and trauma: Secondary | ICD-10-CM | POA: Insufficient documentation

## 2014-02-07 DIAGNOSIS — Z7982 Long term (current) use of aspirin: Secondary | ICD-10-CM | POA: Insufficient documentation

## 2014-02-07 DIAGNOSIS — R339 Retention of urine, unspecified: Secondary | ICD-10-CM | POA: Diagnosis not present

## 2014-02-07 DIAGNOSIS — Z79899 Other long term (current) drug therapy: Secondary | ICD-10-CM | POA: Diagnosis not present

## 2014-02-07 LAB — URINALYSIS, ROUTINE W REFLEX MICROSCOPIC
Bilirubin Urine: NEGATIVE
Glucose, UA: NEGATIVE mg/dL
Ketones, ur: NEGATIVE mg/dL
Leukocytes, UA: NEGATIVE
Nitrite: NEGATIVE
Protein, ur: 30 mg/dL — AB
SPECIFIC GRAVITY, URINE: 1.021 (ref 1.005–1.030)
Urobilinogen, UA: 0.2 mg/dL (ref 0.0–1.0)
pH: 6.5 (ref 5.0–8.0)

## 2014-02-07 LAB — I-STAT CHEM 8, ED
BUN: 25 mg/dL — ABNORMAL HIGH (ref 6–23)
CREATININE: 0.9 mg/dL (ref 0.50–1.35)
Calcium, Ion: 1.14 mmol/L (ref 1.13–1.30)
Chloride: 99 mEq/L (ref 96–112)
Glucose, Bld: 96 mg/dL (ref 70–99)
HCT: 47 % (ref 39.0–52.0)
HEMOGLOBIN: 16 g/dL (ref 13.0–17.0)
POTASSIUM: 3.9 meq/L (ref 3.7–5.3)
SODIUM: 136 meq/L — AB (ref 137–147)
TCO2: 24 mmol/L (ref 0–100)

## 2014-02-07 LAB — URINE MICROSCOPIC-ADD ON

## 2014-02-07 NOTE — Discharge Instructions (Signed)
Please return to the Er if unable to urinate again, otherwise, see Urologist as soon as possible for the bloody urine.   Hematuria Hematuria is blood in your urine. It can be caused by a bladder infection, kidney infection, prostate infection, kidney stone, or cancer of your urinary tract. Infections can usually be treated with medicine, and a kidney stone usually will pass through your urine. If neither of these is the cause of your hematuria, further workup to find out the reason may be needed. It is very important that you tell your health care provider about any blood you see in your urine, even if the blood stops without treatment or happens without causing pain. Blood in your urine that happens and then stops and then happens again can be a symptom of a very serious condition. Also, pain is not a symptom in the initial stages of many urinary cancers. HOME CARE INSTRUCTIONS   Drink lots of fluid, 3-4 quarts a day. If you have been diagnosed with an infection, cranberry juice is especially recommended, in addition to large amounts of water.  Avoid caffeine, tea, and carbonated beverages because they tend to irritate the bladder.  Avoid alcohol because it may irritate the prostate.  Take all medicines as directed by your health care provider.  If you were prescribed an antibiotic medicine, finish it all even if you start to feel better.  If you have been diagnosed with a kidney stone, follow your health care provider's instructions regarding straining your urine to catch the stone.  Empty your bladder often. Avoid holding urine for long periods of time.  After a bowel movement, women should cleanse front to back. Use each tissue only once.  Empty your bladder before and after sexual intercourse if you are a male. SEEK MEDICAL CARE IF:  You develop back pain.  You have a fever.  You have a feeling of sickness in your stomach (nausea) or vomiting.  Your symptoms are not better in  3 days. Return sooner if you are getting worse. SEEK IMMEDIATE MEDICAL CARE IF:   You develop severe vomiting and are unable to keep the medicine down.  You develop severe back or abdominal pain despite taking your medicines.  You begin passing a large amount of blood or clots in your urine.  You feel extremely weak or faint, or you pass out. MAKE SURE YOU:   Understand these instructions.  Will watch your condition.  Will get help right away if you are not doing well or get worse. Document Released: 03/23/2005 Document Revised: 08/07/2013 Document Reviewed: 11/21/2012 [facility] Patient Information 2015 [place], [place]. This information is not intended to replace advice given to you by your health care provider. Make sure you discuss any questions you have with your health care provider.   Acute Urinary Retention Acute urinary retention is the temporary inability to urinate. This is a common problem in older men. As men age their prostates become larger and block the flow of urine from the bladder. This is usually a problem that has come on gradually.  HOME CARE INSTRUCTIONS If you are sent home with a Foley catheter and a drainage system, you will need to discuss the best course of action with your health care provider. While the catheter is in, maintain a good intake of fluids. Keep the drainage bag emptied and lower than your catheter. This is so that contaminated urine will not flow back into your bladder, which could lead to a urinary tract infection.  There are two main types of drainage bags. One is a large bag that usually is used at night. It has a good capacity that will allow you to sleep through the night without having to empty it. The second type is called a leg bag. It has a smaller capacity, so it needs to be emptied more frequently. However, the main advantage is that it can be attached by a leg strap and can go underneath your clothing, allowing you the freedom to move about  or leave your home. Only take over-the-counter or prescription medicines for pain, discomfort, or fever as directed by your health care provider.  SEEK MEDICAL CARE IF:  You develop a low-grade fever.  You experience spasms or leakage of urine with the spasms. SEEK IMMEDIATE MEDICAL CARE IF:   You develop chills or fever.  Your catheter stops draining urine.  Your catheter falls out.  You start to develop increased bleeding that does not respond to rest and increased fluid intake. MAKE SURE YOU:  Understand these instructions.  Will watch your condition.  Will get help right away if you are not doing well or get worse. Document Released: 06/29/2000 Document Revised: 03/28/2013 Document Reviewed: 09/01/2012 [facility] Patient Information 2015 [place], [place]. This information is not intended to replace advice given to you by your health care provider. Make sure you discuss any questions you have with your health care provider.

## 2014-02-07 NOTE — ED Notes (Signed)
BLADDER SCAN showed >980mL

## 2014-02-07 NOTE — ED Provider Notes (Signed)
CSN: [id number]     Arrival date & time 02/07/14  0048 History   First MD Initiated Contact with Patient 02/07/14 0252     Chief Complaint  Patient presents with  . Urinary Retention  . Hematuria     (Consider location/radiation/quality/duration/timing/severity/associated sxs/prior Treatment) HPI Comments: Pt comes in with urinary retention x 24 hours.  Pt has hx of parkinson's and is s/p TURP. States that he noticed some spotting type blood in the urine as of late, and today, he couldn;t urinate, started having some pressure like pain and came to the ER. Whilst awaiting care, pt had a sudden urge to urinate, passed 2 clots, and had a good stream. Pt had a bladder scan prior to this and had > 999 ml urine. We scanned him again after the void, and he had 350 ml of urine. No hx of cancer, not a smoker, no weight loss. Pt was started on meds  For urinary urgency recently.  Patient is a 68 y.o. male presenting with hematuria. The history is provided by the patient.  Hematuria Associated symptoms include abdominal pain. Pertinent negatives include no chest pain and no shortness of breath.    Past Medical History  Diagnosis Date  . Parkinson disease   . Sleep apnea     mild  . BPH (benign prostatic hyperplasia)   . Skin cancer of face     arms and back  . Spider bite     brown recluse/ left ankle/08/13   Past Surgical History  Procedure Laterality Date  . Transurethral resection of prostate      2008  . Knee surgery      exploratory/left  . Colonoscopy    . Skin cancer excision  01/2014    face   Family History  Problem Relation Age of Onset  . COPD Mother   . Cancer Father    History  Substance Use Topics  . Smoking status: Never Smoker   . Smokeless tobacco: Never Used  . Alcohol Use: 3.0 oz/week    3 Glasses of wine, 2 Cans of beer per week     Comment: occasional    Review of Systems  Constitutional: Negative for activity change and appetite change.   Respiratory: Negative for cough and shortness of breath.   Cardiovascular: Negative for chest pain.  Gastrointestinal: Positive for abdominal pain.  Genitourinary: Positive for hematuria and decreased urine volume. Negative for dysuria and urgency.      Allergies  Review of patient's allergies indicates no known allergies.  Home Medications   Prior to Admission medications   Medication Sig Start Date End Date Taking? Authorizing Provider  aspirin EC 81 MG tablet Take 81 mg by mouth 2 (two) times a week.    Yes Historical Provider, MD  carbidopa-levodopa (PARCOPA) 25-100 MG per disintegrating tablet 1/2 pill dissolved in your mouth at 11 AM, 2 PM, and 7 PM Patient taking differently: Take 0.5 tablets by mouth 3 (three) times daily. 11 am, 2 pm, and 7 pm 12/19/13  Yes Star Age, MD  fluorouracil (EFUDEX) 5 % cream Apply 1 application topically 2 (two) times daily. 14 day therapy twice daily patient began on 01/31/2014. 11/27/13  Yes Historical Provider, MD  ketoconazole (NIZORAL) 2 % cream Apply 1 application topically as needed (for chest area).  11/27/13  Yes Historical Provider, MD  rasagiline (AZILECT) 0.5 MG TABS Take 0.5 mg by mouth daily.   Yes Historical Provider, MD  rOPINIRole (REQUIP XL) 8 MG  24 hr tablet Take 16 mg by mouth daily. Take 2 tablets daily   Yes Historical Provider, MD  solifenacin (VESICARE) 5 MG tablet Take 1 tablet (5 mg total) by mouth daily. 02/01/14  Yes Star Age, MD  STALEVO 100 25-100-200 MG per tablet Take 1 tablet by mouth 6 (six) times daily. 12/26/13  Yes Historical Provider, MD  vardenafil (LEVITRA) 20 MG tablet Take 20 mg by mouth daily as needed.   Yes Historical Provider, MD   BP 145/76 mmHg  Pulse 67  Temp(Src) 97.2 F (36.2 C) (Oral)  Resp 15  SpO2 100% Physical Exam  Constitutional: He appears well-developed.  HENT:  Head: Normocephalic.  Eyes: Conjunctivae are normal.  Neck: Normal range of motion. Neck supple.  Pulmonary/Chest:  Effort normal.  Abdominal: Soft.  Nursing note and vitals reviewed.   ED Course  Procedures (including critical care time) Labs Review Labs Reviewed  URINALYSIS, ROUTINE W REFLEX MICROSCOPIC - Abnormal; Notable for the following:    Color, Urine BROWN (*)    APPearance TURBID (*)    Hgb urine dipstick LARGE (*)    Protein, ur 30 (*)    All other components within normal limits  I-STAT CHEM 8, ED - Abnormal; Notable for the following:    Sodium 136 (*)    BUN 25 (*)    All other components within normal limits  URINE CULTURE  URINE MICROSCOPIC-ADD ON    Imaging Review No results found.   EKG Interpretation None      MDM   Final diagnoses:  Urinary retention  Hematuria    Pt with urinary retention, likely from obstruction due to hematuria. Pt passed the clot here, and had a satisfactory void. Not sure why he had hamaturia. UA shows no infection. Cr is normal. Pt has appt with Dr. Lindie Spruce already, he is going to try and get it to move sooner. Pt and i had discussion on foley - as he still had 350 cc of urine in the bladder. Pt reports that he is now going to the bathroom, and would like to avoid a foley catheter -which i think is appropriate care. He is aware that he might have the same obstructive problem again, and will come to the ER if needed.   Varney Biles, MD 02/07/14 [phone]

## 2014-02-07 NOTE — ED Notes (Signed)
Bladder scan 350 ml 

## 2014-02-07 NOTE — ED Notes (Signed)
Patient passed 2 clots @ 1" in length, approx 6-46mm in width.

## 2014-02-07 NOTE — Telephone Encounter (Signed)
Patient records at the front desk.Marland KitchenMarland KitchenMarland Kitchen

## 2014-02-08 DIAGNOSIS — R3989 Other symptoms and signs involving the genitourinary system: Secondary | ICD-10-CM | POA: Diagnosis not present

## 2014-02-08 LAB — URINE CULTURE
CULTURE: NO GROWTH
Colony Count: NO GROWTH

## 2014-02-09 DIAGNOSIS — Z0289 Encounter for other administrative examinations: Secondary | ICD-10-CM

## 2014-02-16 DIAGNOSIS — G20 Parkinson's disease: Secondary | ICD-10-CM | POA: Diagnosis not present

## 2014-03-05 DIAGNOSIS — H5203 Hypermetropia, bilateral: Secondary | ICD-10-CM | POA: Diagnosis not present

## 2014-03-05 DIAGNOSIS — H43812 Vitreous degeneration, left eye: Secondary | ICD-10-CM | POA: Diagnosis not present

## 2014-03-05 DIAGNOSIS — H2513 Age-related nuclear cataract, bilateral: Secondary | ICD-10-CM | POA: Diagnosis not present

## 2014-03-05 DIAGNOSIS — H00014 Hordeolum externum left upper eyelid: Secondary | ICD-10-CM | POA: Diagnosis not present

## 2014-03-07 DIAGNOSIS — R310 Gross hematuria: Secondary | ICD-10-CM | POA: Diagnosis not present

## 2014-03-07 DIAGNOSIS — N5201 Erectile dysfunction due to arterial insufficiency: Secondary | ICD-10-CM | POA: Diagnosis not present

## 2014-03-07 DIAGNOSIS — R972 Elevated prostate specific antigen [PSA]: Secondary | ICD-10-CM | POA: Diagnosis not present

## 2014-03-07 DIAGNOSIS — R351 Nocturia: Secondary | ICD-10-CM | POA: Diagnosis not present

## 2014-03-07 DIAGNOSIS — N320 Bladder-neck obstruction: Secondary | ICD-10-CM | POA: Diagnosis not present

## 2014-03-07 DIAGNOSIS — Q6211 Congenital occlusion of ureteropelvic junction: Secondary | ICD-10-CM | POA: Diagnosis not present

## 2014-03-26 DIAGNOSIS — H0014 Chalazion left upper eyelid: Secondary | ICD-10-CM | POA: Diagnosis not present

## 2014-04-04 ENCOUNTER — Telehealth: Payer: Self-pay | Admitting: Neurology

## 2014-04-04 NOTE — Telephone Encounter (Signed)
Form received, completed and faxed back.  I called the patient.  He is aware.

## 2014-04-04 NOTE — Telephone Encounter (Signed)
Patient stated [facility] will forward [facility] exceptional care form within 1 hour to have Rx refills for rOPINIRole (REQUIP XL) 8 MG 24 hr tablet and STALEVO 100 25-100-200 MG per tablet.  Immediate Expedited Review needs to be checked in order to receive medication within 24 hrs, before new year.  Please call and advise.

## 2014-04-04 NOTE — Telephone Encounter (Signed)
[employer] has approved STALEVO 100 25-100-200 MG per tablet for one year.  They will also send out a letter of approval.  If you have questions please call.

## 2014-04-06 HISTORY — PX: BURR HOLE W/ STEREOTACTIC INSERTION OF DBS LEADS / INTRAOP MICROELECTRODE RECORDING: SUR171

## 2014-04-30 DIAGNOSIS — F819 Developmental disorder of scholastic skills, unspecified: Secondary | ICD-10-CM | POA: Diagnosis not present

## 2014-04-30 DIAGNOSIS — G20 Parkinson's disease: Secondary | ICD-10-CM | POA: Diagnosis not present

## 2014-04-30 DIAGNOSIS — F900 Attention-deficit hyperactivity disorder, predominantly inattentive type: Secondary | ICD-10-CM | POA: Diagnosis not present

## 2014-05-06 ENCOUNTER — Other Ambulatory Visit: Payer: Self-pay | Admitting: Neurology

## 2014-05-14 DIAGNOSIS — Z982 Presence of cerebrospinal fluid drainage device: Secondary | ICD-10-CM | POA: Diagnosis not present

## 2014-05-14 DIAGNOSIS — G20 Parkinson's disease: Secondary | ICD-10-CM | POA: Diagnosis not present

## 2014-05-30 ENCOUNTER — Telehealth: Payer: Self-pay | Admitting: *Deleted

## 2014-05-30 MED ORDER — RASAGILINE MESYLATE 1 MG PO TABS: 1.0000 mg | ORAL_TABLET | Freq: Every day | ORAL | Status: DC

## 2014-05-30 NOTE — Telephone Encounter (Signed)
Patient is completely out of Azilect and would like a 5 day supply called into his local pharmacy until his mail order can deliver his medication. Please advise.

## 2014-05-30 NOTE — Telephone Encounter (Signed)
OV notes say he takes 1mg  daily.  Rx has been sent.  I called the patient back.  He is aware.

## 2014-05-31 ENCOUNTER — Telehealth: Payer: Self-pay | Admitting: Neurology

## 2014-05-31 NOTE — Telephone Encounter (Signed)
Scheduled/ confirmed f/u appointment after DBS surgery

## 2014-05-31 NOTE — Telephone Encounter (Signed)
Pt called to cancel appt for 2/29 he is having DBS surgery tomorrow (2/26) @ [facility] with Dr. Salomon Fick. He would like to get a call back from Dr. Rexene Alberts to address when he should come back to see her. Please call and advise.

## 2014-05-31 NOTE — Telephone Encounter (Signed)
Called and scheduled f/u appointment after DBS surgery

## 2014-05-31 NOTE — Telephone Encounter (Signed)
Please call pt and tell him, that he should be in about 2 months after DBS surgery. He will need initial programming at [facility] [place] and once this is ok, we can see him back. Good luck for the surgery from all of [place]!

## 2014-06-01 DIAGNOSIS — F919 Conduct disorder, unspecified: Secondary | ICD-10-CM | POA: Diagnosis present

## 2014-06-01 DIAGNOSIS — G20 Parkinson's disease: Secondary | ICD-10-CM | POA: Diagnosis not present

## 2014-06-01 DIAGNOSIS — Z820 Family history of epilepsy and other diseases of the nervous system: Secondary | ICD-10-CM | POA: Diagnosis not present

## 2014-06-01 DIAGNOSIS — Z85828 Personal history of other malignant neoplasm of skin: Secondary | ICD-10-CM | POA: Diagnosis not present

## 2014-06-01 DIAGNOSIS — G4733 Obstructive sleep apnea (adult) (pediatric): Secondary | ICD-10-CM | POA: Diagnosis present

## 2014-06-01 DIAGNOSIS — N400 Enlarged prostate without lower urinary tract symptoms: Secondary | ICD-10-CM | POA: Diagnosis present

## 2014-06-04 ENCOUNTER — Ambulatory Visit: Payer: Medicare Other | Admitting: Neurology

## 2014-06-13 DIAGNOSIS — Z85828 Personal history of other malignant neoplasm of skin: Secondary | ICD-10-CM | POA: Diagnosis not present

## 2014-06-13 DIAGNOSIS — G4733 Obstructive sleep apnea (adult) (pediatric): Secondary | ICD-10-CM | POA: Diagnosis not present

## 2014-06-13 DIAGNOSIS — F919 Conduct disorder, unspecified: Secondary | ICD-10-CM | POA: Diagnosis not present

## 2014-06-13 DIAGNOSIS — G20 Parkinson's disease: Secondary | ICD-10-CM | POA: Diagnosis not present

## 2014-06-13 DIAGNOSIS — Z809 Family history of malignant neoplasm, unspecified: Secondary | ICD-10-CM | POA: Diagnosis not present

## 2014-06-13 DIAGNOSIS — N400 Enlarged prostate without lower urinary tract symptoms: Secondary | ICD-10-CM | POA: Diagnosis not present

## 2014-06-13 DIAGNOSIS — Z7982 Long term (current) use of aspirin: Secondary | ICD-10-CM | POA: Diagnosis not present

## 2014-06-21 DIAGNOSIS — Z9689 Presence of other specified functional implants: Secondary | ICD-10-CM | POA: Diagnosis not present

## 2014-06-21 DIAGNOSIS — Z462 Encounter for fitting and adjustment of other devices related to nervous system and special senses: Secondary | ICD-10-CM | POA: Diagnosis not present

## 2014-06-21 DIAGNOSIS — G20 Parkinson's disease: Secondary | ICD-10-CM | POA: Diagnosis not present

## 2014-07-25 DIAGNOSIS — G20 Parkinson's disease: Secondary | ICD-10-CM | POA: Diagnosis not present

## 2014-07-25 DIAGNOSIS — Z9689 Presence of other specified functional implants: Secondary | ICD-10-CM | POA: Diagnosis not present

## 2014-07-31 DIAGNOSIS — R269 Unspecified abnormalities of gait and mobility: Secondary | ICD-10-CM | POA: Diagnosis not present

## 2014-07-31 DIAGNOSIS — G20 Parkinson's disease: Secondary | ICD-10-CM | POA: Diagnosis not present

## 2014-07-31 DIAGNOSIS — R279 Unspecified lack of coordination: Secondary | ICD-10-CM | POA: Diagnosis not present

## 2014-07-31 DIAGNOSIS — Z5189 Encounter for other specified aftercare: Secondary | ICD-10-CM | POA: Diagnosis not present

## 2014-08-01 ENCOUNTER — Other Ambulatory Visit: Payer: Self-pay | Admitting: Dermatology

## 2014-08-01 DIAGNOSIS — L565 Disseminated superficial actinic porokeratosis (DSAP): Secondary | ICD-10-CM | POA: Diagnosis not present

## 2014-08-01 DIAGNOSIS — Z85828 Personal history of other malignant neoplasm of skin: Secondary | ICD-10-CM | POA: Diagnosis not present

## 2014-08-01 DIAGNOSIS — D225 Melanocytic nevi of trunk: Secondary | ICD-10-CM | POA: Diagnosis not present

## 2014-08-01 DIAGNOSIS — L570 Actinic keratosis: Secondary | ICD-10-CM | POA: Diagnosis not present

## 2014-08-01 DIAGNOSIS — D1801 Hemangioma of skin and subcutaneous tissue: Secondary | ICD-10-CM | POA: Diagnosis not present

## 2014-08-01 DIAGNOSIS — B351 Tinea unguium: Secondary | ICD-10-CM | POA: Diagnosis not present

## 2014-08-07 ENCOUNTER — Encounter: Payer: Self-pay | Admitting: Neurology

## 2014-08-07 ENCOUNTER — Ambulatory Visit (INDEPENDENT_AMBULATORY_CARE_PROVIDER_SITE_OTHER): Payer: Medicare Other | Admitting: Neurology

## 2014-08-07 VITALS — BP 114/67 | HR 64 | Resp 16 | Ht 71.0 in | Wt 166.0 lb

## 2014-08-07 DIAGNOSIS — G20 Parkinson's disease: Secondary | ICD-10-CM | POA: Diagnosis not present

## 2014-08-07 DIAGNOSIS — Z9689 Presence of other specified functional implants: Secondary | ICD-10-CM

## 2014-08-07 DIAGNOSIS — Z9889 Other specified postprocedural states: Secondary | ICD-10-CM

## 2014-08-07 DIAGNOSIS — R3915 Urgency of urination: Secondary | ICD-10-CM | POA: Diagnosis not present

## 2014-08-07 NOTE — Progress Notes (Signed)
Subjective:    Patient ID: Ronald Noble is a 69 y.o. male.  HPI     Interim history:   Ronald Noble is a 35-year-oqld right-handed gentleman with an underlying medical history of skin cancer including melanoma, enlarged prostate status post TURP who presents for followup consultation of his R sided predominant Parkinson's disease, complicated by RBD and sleep disturbance as well as mild dyskinesias, now s/p bilateral DBS placement at [facility]. He is accompanied by his wife again today, who joined [place] later, as she had to attend a good friend's funeral, separately. I last saw him on 02/01/2014, at which time he reported difficulty staying asleep. He had not consistently tried melatonin. He had problems with urinary urgency and was advised to try Kegel exercises by his urologist. His PCP talked to an infectious diseases specialist and it was determined he did not actually have Lyme disease.   Today, 08/07/2014:  He is able to provide his own history. His wife adds additional information. She feels he has done great. He has been eating well. He may not drink enough water. His mood is better and she feels that he has been more upbeat than she felt he was before surgery. His secretary at work made a similar comment to her. He reports that he has had 2 appointments with [name] at [facility] since his DBS, and his last appointment with them for programming was last month. He was advised to lower Stalevo from 6 pills a day to 4 pills a day, spacing them 4 hourly. He tried this for a few days but has now increased back to 5 pills a day, spacing them 3-3-1/2 hours apart. He has some of time if he tries to go longer than this. He also has problems with stiffness and low back pain when he is off. Generally speaking he is doing well. He is pleased with how he has done but does admit that he is a little impatient. He has had no residual pain or swelling or headaches or chest pain. His generator site is healing well.  He's been back to work. He is currently on Azilect once daily, Requip XL 8 mg twice daily, Stalevo 100 mg strength one pill 5 times a day and he has stopped the carbidopa-levodopa. I reviewed his outside records from Dr. Salomon Fick at [facility] neurosurgery. Patient had bilateral STN DBS electrodes placed on 06/01/2014. He had pulse generators placed approximately 3 weeks after that, 06/13/14.   Previously:  He did not show for an appointment on 01/30/2014 - he genuinely forgot!  I saw him on 12/19/2013, at which time he reported that his medication was not working as well. He reported more freezing episodes and more off time. Of note, he had been using a new protein milkshake that he was drinking first thing in the morning. He had lost some weight and in an effort to improve his weight loss he had started this new supplements. His primary care physician had referred him to a nutritionist. We also talked about DBS evaluation and he requested to see another specialist for DBS candidacy. I referred him to [name] at [facility] I also advised him to discontinue using the new protein supplement as I felt it may be interfering with the absorption of his Stalevo. I also suggested a trial of Parcopa half a pill 3 times a day at 11, 2 PM and 7 PM and we kept the Stalevo the same. He was  advised to use MiraLAX as needed for constipation.   I saw him on 07/31/13, at which time [name], including swimming, yoga, and weights. He has been on Stalevo 100 mg every 2 1/2 hours starting at 7:30 AM for 6 doses. He stopped clonazepam, as he felt some daytime sluggishness. He has been taking C/L 1/2 tid in between. He had no cognitive issues, but did have some "downtime" midday. I did not change his medications. He had an appointment with [name] at [facility] in June, but there was a mix up with his appointment and he does not have an  appointment until next year.   In the interim, he was diagnosed with Lyme d/s in July. He was tested and treated by his PCP has has been referred to an infectious d/s doctor. He brings in his latest lying test results from 12/05/2013 which I reviewed: It looks like his IgM antibody status is negative but his IgG antibody status has not quite turn positive as far as I understand the report. That means he is probably in the subacute or early chronic phase if I interpret this correctly. He requested a sooner than scheduled appointment, due to feeling more freezing and more off time. This starts in the late morning hours but can also go into the afternoon after lunch. Cognitively and mood wise he feels stable. He does not sleep well at night. He has not consistently tried melatonin I recommended.   I saw him on 01/30/2013, at which time I felt he was stable. We talked about DBS surgery. I did not make any medication changes. He is aware of the increase risk of melanoma associated with DAs and his dermatologist is aware that he is taking Requip XL.   I first met him on 09/27/2012, which time I did not make any changes to his medication and did not order any new test as he was stable. He saw [name] at [facility] and discussed DBS in the past but was not deemed a cadidate for it at the time as he was doing well. He was told by [name] to stay on schedule with his medication. 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 Sleep has been an issue. He goes to sleep well, but wakes up after 45 minutes to 1 hour. He is seeing a urologist. He exercises regularly.   He previously followed with [name] and was last seen by him on 05/25/2012, at which time [name] felt he was stable, but because of off time at 2 hours he increased his carbidopa-levodopa by adding half of a 10-100 mg strength 3 times a day to his regimen. He recommended decreasing his Stalevo to 125 mg from 100 mg 6 times a day. He has  been on Stalevo 125 mg 6 times a day, at 7:30, 10, 12:30, 3 PM, 5:30 PM and 8 PM. He is on clonazepam 0.5 mg at night, Requip long-acting 6 mg twice daily, rasagiline 1 mg once daily, carbidopa-levodopa 10-100 mg strength half a tablet 3 times a day, 7:30 to 10, then midday and later in the evening.   He was followed by [name] since 2008 with a history of Parkinson's disease. He has a history of increased tone and slowness as well as tremor with R sided predominance. He was initially placed on Requip XL and then rasagiline was added in October 2008.  In February 2010 Stalevo was added. He had low vitamin D levels in 2008. ANA, ESR, ceruloplasmin were normal except for slightly low ceruloplasmin of 16. Urine copper test was negative. Methylmalonic acid was normal. Urine for heavy metals is normal as well. MRI brain with and without contrast on 01/07/2007 was normal. He had a sleep study in October 2008 showing PLMs and evidence of RBD. Repeat sleep study in September 2011 showed supine obstructive sleep apnea and RBD. He had a CPAP titration study in late September 2011 which was not very successful. He has been on clonazepam at night. He has not noticed any compulsive behaviors, memory loss, depression, swelling, or orthostatic dizziness. He recently had a skin biopsy showing atypical cells, concerning for melanoma.   He works full-time as an Forensic psychologist, [employer]. He exercises regularly and does yoga.   His Past Medical History Is Significant For: Past Medical History  Diagnosis Date  . Parkinson disease   . Sleep apnea     mild  . BPH (benign prostatic hyperplasia)   . Skin cancer of face     arms and back  . Spider bite     brown recluse/ left ankle/08/13    His Past Surgical History Is Significant For: Past Surgical History  Procedure Laterality Date  . Transurethral resection of prostate      2008  . Knee surgery      exploratory/left  . Colonoscopy    . Skin cancer excision   01/2014    face  . Burr hole w/ stereotactic insertion of dbs leads / intraop microelectrode recording      His Family History Is Significant For: Family History  Problem Relation Age of Onset  . COPD Mother   . Cancer Father     His Social History Is Significant For: History   Social History  . Marital Status: Married    Spouse Name: Ronald Noble  . Number of Children: 4  . Years of Education: 66   Occupational History  . LAWYER    Social History Main Topics  . Smoking status: Never Smoker   . Smokeless tobacco: Never Used  . Alcohol Use: 3.0 oz/week    3 Glasses of wine, 2 Cans of beer per week     Comment: occasional  . Drug Use: No  . Sexual Activity: Not on file   Other Topics Concern  . None   Social History Narrative   Patient is right handed and resides with wife   1-2 cups of coffee a day     His Allergies Are:  No Known Allergies:   His Current Medications Are:  Outpatient Encounter Prescriptions as of 08/07/2014  Medication Sig  . aspirin EC 81 MG tablet Take 81 mg by mouth 2 (two) times a week.   Marland Kitchen ketoconazole (NIZORAL) 2 % cream Apply 1 application topically as needed (for chest area).   . rasagiline (AZILECT) 1 MG TABS tablet Take 1 tablet (1 mg total) by mouth daily.  Marland Kitchen rOPINIRole (REQUIP XL) 8 MG 24 hr tablet Take 16 mg by mouth daily. Take 2 tablets daily  . solifenacin (VESICARE) 5 MG tablet Take 1 tablet (5 mg total) by mouth daily.  . STALEVO 100 25-100-200 MG per tablet Take 1 tablet by mouth 6 (six) times daily.  . vardenafil (LEVITRA) 20 MG tablet Take 20 mg by mouth daily as needed.  . [DISCONTINUED] carbidopa-levodopa (PARCOPA) 25-100 MG per disintegrating tablet 1/2 pill dissolved in your mouth  at 11 AM, 2 PM, and 7 PM (Patient not taking: Reported on 08/07/2014)  . [DISCONTINUED] carbidopa-levodopa-entacapone (STALEVO) 25-100-200 MG per tablet TAKE 1 TABLET BY MOUTH 6 (SIX) TIMES DAILY. (Patient not taking: Reported on 08/07/2014)  .  [DISCONTINUED] fluorouracil (EFUDEX) 5 % cream Apply 1 application topically 2 (two) times daily. 14 day therapy twice daily patient began on 01/31/2014.   No facility-administered encounter medications on file as of 08/07/2014.  :  Review of Systems:  Out of a complete 14 point review of systems, all are reviewed and negative with the exception of these symptoms as listed below:   Review of Systems  Neurological:       Adjusting to DBS     Objective:  Neurologic Exam  Physical Exam Physical Examination:   Filed Vitals:   08/07/14 1531  BP: 114/67  Pulse: 64  Resp: 16   General Examination: The patient is a very pleasant 69 y.o. male in no acute distress.   HEENT: Normocephalic, atraumatic, pupils are equal, round and reactive to light and accommodation. Extraocular tracking shows mild saccadic breakdown without nystagmus noted. There is no limitation to his gaze. There is mild decrease in eye blink rate. Hearing is intact. Face is symmetric with mild facial masking and normal facial sensation. There is no lip, neck or jaw tremor. Neck is moderately rigid with intact passive ROM. There are no carotid bruits on auscultation. Oropharynx exam reveals mild mouth dryness. No significant airway crowding is noted. Mallampati is class II. Tongue protrudes centrally and palate elevates symmetrically. There is no drooling. There are mild generalized dyskinesias. His stimulator wires are tunneled on the left side.   Chest: is clear to auscultation without wheezing, rhonchi or crackles noted. His generator site looks fine. Skin has healed well.  Heart: sounds are regular and normal without murmurs, rubs or gallops noted.   Abdomen: is soft, non-tender and non-distended with normal bowel sounds appreciated on auscultation.  Extremities: There is no trace edema in the distal lower extremities bilaterally.   Skin: is warm and dry with no trophic changes noted. Age-related changes are noted on the  skin and scars from skin cancer rem.   Musculoskeletal: exam reveals no obvious joint deformities, tenderness, joint swelling or erythema.  Neurologically:  Mental status: The patient is awake and alert, paying good  attention. He is able to completely provide the history. He is oriented to: person, place, time/date, situation, day of week, month of year and year. His memory, attention, language and knowledge are intact. There is no aphasia, agnosia, apraxia or anomia. There is a no significant degree of bradyphrenia. Speech is mildly hypophonic with no dysarthria noted. Mood is congruent and affect is normal.   Cranial nerves are as described above under HEENT exam. In addition, shoulder shrug is normal with equal shoulder height noted.  Motor exam: Normal bulk, and strength for age is noted. There are mild generalized dyskinesias noted, these are intermittent.    Tone is mildly rigid with presence of cogwheeling in the right upper extremity. There is overall mild bradykinesia. There is no drift or rebound.  There is a mild resting tremor in the right upper extremity. The tremor is intermittent.  Romberg is negative.  Reflexes are 1+ in the upper extremities and 1+ in the lower extremities. Toes are downgoing bilaterally.  Fine motor skills exam:  Fine motor skills are mild to moderately impaired. Cerebellar testing shows no dysmetria or intention tremor on finger to nose testing.  Heel to shin is unremarkable bilaterally. There is no truncal or gait ataxia.   Sensory exam is intact to light touch throughout .   Gait, station and balance: He stands up from the seated position with no significant difficulty and does not need to push up with His hands. He needs no assistance. No veering to one side is noted. He is not noted to lean to the side. Posture is mildly stooped, advanced for age. Stance is narrow-based. He walks with  fairly good pace and stride length and fairly good arm swing bilaterally  today. He has slight difficulty turning. Balance is minimally impaired.     Assessment and Plan:   In summary, Ronald Noble is a very pleasant 69 year old male with a history of right-sided predominant Parkinson's disease, complicated by RBD, sleep disturbance, generalized dyskinesias, motor fluctuations, constipation and bladder hyperactivity, now s/p b/l DBS placement at [facility] on 06/01/14 and generator placement on 06/13/2014. Overall, he is doing rather well. He and his wife are very pleased with their experience at [facility], which is very reassuring for me to hear. They speak very highly of the entire experience, which made the surgery and aftercare so much more tolerable. He is currently on Stalevo 1 pill 5 times a day and off of generic levodopa. He is still adjusting to the DBS and overall has done rather well. I've asked him to be more patient. I've asked him to continue with Stalevo 5 times a day and we will down the road try to gradually reduce it further to perhaps 4 pills a day and also try to reduce his Requip long-acting after that. Requip has to be reduced gradually. He has a little bit of peripheral edema today, just trace right above both ankles and we will watch for edema. He is advised to drink more water and stay active physically and mentally. Mood wise is doing great. He has an appointment later this month for postop checkup with Dr. Salomon Fick and also an appointment for programming with Dr. Donna Christen group this month as I understand. I would like to see him back in 3 months, sooner if the need arises.  I answered all their questions today and the patient and his wife were in agreement. I spent 40 minutes in total face-to-face time with the patient, more than 50% of which was spent in counseling and coordination of care, reviewing test results, reviewing medication and discussing or reviewing the diagnosis of PD, s/p DBS, the prognosis and treatment adjustments down the road.

## 2014-08-07 NOTE — Patient Instructions (Addendum)
We will continue with your medications, including Stalevo 5 times a day. I think, you are looking great!  I will see you back in 3 months.

## 2014-08-21 DIAGNOSIS — Z85828 Personal history of other malignant neoplasm of skin: Secondary | ICD-10-CM | POA: Diagnosis not present

## 2014-08-21 DIAGNOSIS — Z9689 Presence of other specified functional implants: Secondary | ICD-10-CM | POA: Diagnosis not present

## 2014-08-21 DIAGNOSIS — G20 Parkinson's disease: Secondary | ICD-10-CM | POA: Diagnosis not present

## 2014-08-21 DIAGNOSIS — N400 Enlarged prostate without lower urinary tract symptoms: Secondary | ICD-10-CM | POA: Diagnosis not present

## 2014-08-21 DIAGNOSIS — G4733 Obstructive sleep apnea (adult) (pediatric): Secondary | ICD-10-CM | POA: Diagnosis not present

## 2014-08-30 DIAGNOSIS — Z9689 Presence of other specified functional implants: Secondary | ICD-10-CM | POA: Diagnosis not present

## 2014-08-30 DIAGNOSIS — G20 Parkinson's disease: Secondary | ICD-10-CM | POA: Diagnosis not present

## 2014-09-21 DIAGNOSIS — Z4542 Encounter for adjustment and management of neuropacemaker (brain) (peripheral nerve) (spinal cord): Secondary | ICD-10-CM | POA: Diagnosis not present

## 2014-09-21 DIAGNOSIS — G4752 REM sleep behavior disorder: Secondary | ICD-10-CM | POA: Diagnosis not present

## 2014-09-21 DIAGNOSIS — Z462 Encounter for fitting and adjustment of other devices related to nervous system and special senses: Secondary | ICD-10-CM | POA: Diagnosis not present

## 2014-09-21 DIAGNOSIS — G20 Parkinson's disease: Secondary | ICD-10-CM | POA: Diagnosis not present

## 2014-10-01 ENCOUNTER — Other Ambulatory Visit: Payer: Self-pay

## 2014-10-22 ENCOUNTER — Other Ambulatory Visit: Payer: Self-pay | Admitting: Neurology

## 2014-10-22 DIAGNOSIS — G20 Parkinson's disease: Secondary | ICD-10-CM

## 2014-10-22 DIAGNOSIS — G4700 Insomnia, unspecified: Secondary | ICD-10-CM

## 2014-10-22 MED ORDER — MELATONIN ER 3 MG PO TBCR: EXTENDED_RELEASE_TABLET | ORAL | Status: DC

## 2014-10-22 NOTE — Progress Notes (Signed)
Pls advise patient I have entered Rx for long acting Melatonin 3 mg, take 1-2 pills, about 1 hr before bedtime as needed. Should be at pharmacy today.

## 2014-10-22 NOTE — Progress Notes (Signed)
I spoke to patient, he is aware of advice below. He may try decreasing the clonazepam.

## 2014-10-29 ENCOUNTER — Emergency Department (HOSPITAL_COMMUNITY): Payer: Medicare Other

## 2014-10-29 ENCOUNTER — Encounter (HOSPITAL_COMMUNITY): Payer: Self-pay | Admitting: Family Medicine

## 2014-10-29 ENCOUNTER — Emergency Department (HOSPITAL_COMMUNITY)
Admission: EM | Admit: 2014-10-29 | Discharge: 2014-10-29 | Disposition: A | Discharge status: Home / Self Care | Payer: Medicare Other | Attending: Emergency Medicine | Admitting: Emergency Medicine

## 2014-10-29 DIAGNOSIS — Z7982 Long term (current) use of aspirin: Secondary | ICD-10-CM | POA: Diagnosis not present

## 2014-10-29 DIAGNOSIS — W1839XA Other fall on same level, initial encounter: Secondary | ICD-10-CM | POA: Diagnosis not present

## 2014-10-29 DIAGNOSIS — Y990 Civilian activity done for income or pay: Secondary | ICD-10-CM | POA: Diagnosis not present

## 2014-10-29 DIAGNOSIS — G20 Parkinson's disease: Secondary | ICD-10-CM | POA: Diagnosis not present

## 2014-10-29 DIAGNOSIS — R55 Syncope and collapse: Secondary | ICD-10-CM | POA: Diagnosis not present

## 2014-10-29 DIAGNOSIS — Y9389 Activity, other specified: Secondary | ICD-10-CM | POA: Insufficient documentation

## 2014-10-29 DIAGNOSIS — Z79899 Other long term (current) drug therapy: Secondary | ICD-10-CM | POA: Insufficient documentation

## 2014-10-29 DIAGNOSIS — S0121XA Laceration without foreign body of nose, initial encounter: Secondary | ICD-10-CM | POA: Insufficient documentation

## 2014-10-29 DIAGNOSIS — Y9289 Other specified places as the place of occurrence of the external cause: Secondary | ICD-10-CM | POA: Diagnosis not present

## 2014-10-29 DIAGNOSIS — S022XXA Fracture of nasal bones, initial encounter for closed fracture: Secondary | ICD-10-CM

## 2014-10-29 DIAGNOSIS — J449 Chronic obstructive pulmonary disease, unspecified: Secondary | ICD-10-CM | POA: Diagnosis not present

## 2014-10-29 DIAGNOSIS — S0990XA Unspecified injury of head, initial encounter: Secondary | ICD-10-CM | POA: Diagnosis not present

## 2014-10-29 LAB — URINALYSIS, ROUTINE W REFLEX MICROSCOPIC
Bilirubin Urine: NEGATIVE
Glucose, UA: NEGATIVE mg/dL
Hgb urine dipstick: NEGATIVE
Ketones, ur: NEGATIVE mg/dL
Leukocytes, UA: NEGATIVE
Nitrite: NEGATIVE
Protein, ur: NEGATIVE mg/dL
Specific Gravity, Urine: 1.019 (ref 1.005–1.030)
Urobilinogen, UA: 0.2 mg/dL (ref 0.0–1.0)
pH: 5.5 (ref 5.0–8.0)

## 2014-10-29 LAB — BASIC METABOLIC PANEL
Anion gap: 7 (ref 5–15)
BUN: 13 mg/dL (ref 6–20)
CALCIUM: 8.8 mg/dL — AB (ref 8.9–10.3)
CO2: 27 mmol/L (ref 22–32)
Chloride: 99 mmol/L — ABNORMAL LOW (ref 101–111)
Creatinine, Ser: 0.82 mg/dL (ref 0.61–1.24)
GFR calc non Af Amer: >60 mL/min (ref >60)
GLUCOSE: 104 mg/dL — AB (ref 65–99)
POTASSIUM: 4 mmol/L (ref 3.5–5.1)
Sodium: 133 mmol/L — ABNORMAL LOW (ref 135–145)

## 2014-10-29 LAB — TROPONIN I

## 2014-10-29 LAB — CBC
HCT: 45.6 % (ref 39.0–52.0)
Hemoglobin: 15.9 g/dL (ref 13.0–17.0)
MCH: 31.2 pg (ref 26.0–34.0)
MCHC: 34.9 g/dL (ref 30.0–36.0)
MCV: 89.6 fL (ref 78.0–100.0)
Platelets: 137 K/uL — ABNORMAL LOW (ref 150–400)
RBC: 5.09 MIL/uL (ref 4.22–5.81)
RDW: 12.7 % (ref 11.5–15.5)
WBC: 5.4 K/uL (ref 4.0–10.5)

## 2014-10-29 LAB — BASIC METABOLIC PANEL WITH GFR
Anion gap: 4 — ABNORMAL LOW (ref 5–15)
BUN: 14 mg/dL (ref 6–20)
CO2: 31 mmol/L (ref 22–32)
Calcium: 9 mg/dL (ref 8.9–10.3)
Chloride: 101 mmol/L (ref 101–111)
Creatinine, Ser: 0.93 mg/dL (ref 0.61–1.24)
GFR calc Af Amer: >60 mL/min
GFR calc non Af Amer: >60 mL/min
Glucose, Bld: 112 mg/dL — ABNORMAL HIGH (ref 65–99)
Potassium: 5.3 mmol/L — ABNORMAL HIGH (ref 3.5–5.1)
Sodium: 136 mmol/L (ref 135–145)

## 2014-10-29 NOTE — Discharge Instructions (Signed)
CT did reveal nasal fractures, remainder of scans and lab work were largely normal. Please follow-up with ENT regarding your nasal fracture-- call to schedule appt. Copies of lab work and CT scans attached on back. Return here for new or worsening symptoms.

## 2014-10-29 NOTE — ED Notes (Signed)
Pt here for syncopal episode today. sts he was at work sitting down and when he stood up he became dizzy and then passed out. Pt struck nose on a counter. Pt nose swollen and bleeding.

## 2014-10-29 NOTE — ED Provider Notes (Signed)
CSN: [id number]     Arrival date & time 10/29/14  1058 History   First MD Initiated Contact with Patient 10/29/14 1235     Chief Complaint  Patient presents with  . Loss of Consciousness  . Fall     (Consider location/radiation/quality/duration/timing/severity/associated sxs/prior Treatment) Patient is a 69 y.o. male presenting with syncope and fall. The history is provided by the patient and medical records.  Loss of Consciousness Fall    This is a 69 year old male with history of Parkinson's disease, BPH, sleep apnea, presenting here for syncopal episode. Patient states he was at work earlier today, had been sitting at his desk for approximately 45 minutes. 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 He was on the floor for a few minutes, he awoke with people standing around him. He struck his nose on a counter, thinks his nose got cut from his glasses. He did have slight bleeding from the left side of his nose.  Patient states currently he feels fine-- no further dizziness, lightheadedness, or feelings of syncope. No numbness or weakness of his extremities.. He does have a brain stimulator and place.  Patient takes daily aspirin, no other anticoagulation. No chest pain, shortness of breath, fever, chills, or recent illness.  Patient has no history of syncope in the past. Tetanus is up-to-date.  VSS.  Past Medical History  Diagnosis Date  . Parkinson disease   . Sleep apnea     mild  . BPH (benign prostatic hyperplasia)   . Skin cancer of face     arms and back  . Spider bite     brown recluse/ left ankle/08/13   Past Surgical History  Procedure Laterality Date  . Transurethral resection of prostate      2008  . Knee surgery      exploratory/left  . Colonoscopy    . Skin cancer excision  01/2014    face  . Burr hole w/ stereotactic insertion of dbs leads / intraop microelectrode recording     Family History  Problem Relation Age of Onset   . COPD Mother   . Cancer Father    History  Substance Use Topics  . Smoking status: Never Smoker   . Smokeless tobacco: Never Used  . Alcohol Use: 3.0 oz/week    3 Glasses of wine, 2 Cans of beer per week     Comment: occasional    Review of Systems  Cardiovascular: Positive for syncope.  Skin: Positive for wound.  Neurological: Positive for syncope.  All other systems reviewed and are negative.     Allergies  Review of patient's allergies indicates no known allergies.  Home Medications   Prior to Admission medications   Medication Sig Start Date End Date Taking? Authorizing Provider  aspirin EC 81 MG tablet Take 81 mg by mouth 2 (two) times a week.     Historical Provider, MD  ketoconazole (NIZORAL) 2 % cream Apply 1 application topically as needed (for chest area).  11/27/13   Historical Provider, MD  Melatonin CR 3 MG TBCR Take 1 to 2 pills, 1 hour before bedtime as needed. 10/22/14   Star Age, MD  rasagiline (AZILECT) 1 MG TABS tablet Take 1 tablet (1 mg total) by mouth daily. 05/30/14   Star Age, MD  rOPINIRole (REQUIP XL) 8 MG 24 hr tablet Take 16 mg by mouth daily. Take 2 tablets daily    Historical Provider, MD  solifenacin (  VESICARE) 5 MG tablet Take 1 tablet (5 mg total) by mouth daily. 02/01/14   Star Age, MD  STALEVO 100 25-100-200 MG per tablet Take 1 tablet by mouth 6 (six) times daily. 12/26/13   Historical Provider, MD  vardenafil (LEVITRA) 20 MG tablet Take 20 mg by mouth daily as needed.    Historical Provider, MD   BP 128/66 mmHg  Pulse 63  Resp 17  SpO2 97%   Physical Exam  Constitutional: He is oriented to person, place, and time. He appears well-developed and well-nourished. No distress.  HENT:  Head: Normocephalic and atraumatic.  Nose: Nose lacerations (skin tear) present. No septal deviation or nasal septal hematoma.  Mouth/Throat: Uvula is midline, oropharynx is clear and moist and mucous membranes are normal. No oropharyngeal exudate,  posterior oropharyngeal edema, posterior oropharyngeal erythema or tonsillar abscesses.  Skin tear along left bridge of nose; minimal bleeding at this time; some tenderness and swelling noted to bridge of nose; no septa hematoma or deviation noted; mid-face stable; dentition intact; no tongue or lip laceration  Eyes: Conjunctivae and EOM are normal. Pupils are equal, round, and reactive to light.  Neck: Normal range of motion. Neck supple.  Cardiovascular: Normal rate, regular rhythm and normal heart sounds.   Pulmonary/Chest: Effort normal and breath sounds normal. No respiratory distress. He has no wheezes.  Abdominal: Soft. Bowel sounds are normal. There is no tenderness. There is no guarding.  Musculoskeletal: Normal range of motion. He exhibits no edema.       Cervical back: Normal.       Thoracic back: Normal.       Lumbar back: Normal.  Small avulsion type laceration to left ring finger; no active bleeding; full ROM maintained; hand is NVI  Neurological: He is alert and oriented to person, place, and time. He displays no tremor. He displays no seizure activity.  AAOx3, answering questions and following commands appropriately; equal strength UE and LE bilaterally; CN grossly intact; moves all extremities appropriately without ataxia; no focal neuro deficits or facial asymmetry appreciated  Skin: Skin is warm and dry. He is not diaphoretic.  Psychiatric: He has a normal mood and affect.  Nursing note and vitals reviewed.   ED Course  Procedures (including critical care time)  LACERATION REPAIR Performed by: Larene Pickett Authorized by: Larene Pickett Consent: Verbal consent obtained. Risks and benefits: risks, benefits and alternatives were discussed Consent given by: patient Patient identity confirmed: provided demographic data Prepped and Draped in normal sterile fashion Wound explored  Laceration Location: bridge of nose  Laceration Length: 1 cm  No Foreign Bodies seen  or palpated  Anesthesia: none  Local anesthetic: none  Anesthetic total: 0 ml  Irrigation method: syringe Amount of cleaning: standard  Skin closure: dermabond  Number of sutures: 0  Technique: n/a  Patient tolerance: Patient tolerated the procedure well with no immediate complications.  Labs Review Labs Reviewed  BASIC METABOLIC PANEL - Abnormal; Notable for the following:    Potassium 5.3 (*)    Glucose, Bld 112 (*)    Anion gap 4 (*)    All other components within normal limits  CBC - Abnormal; Notable for the following:    Platelets 137 (*)    All other components within normal limits  URINALYSIS, ROUTINE W REFLEX MICROSCOPIC (NOT AT [facility]) - Abnormal; Notable for the following:    Color, Urine AMBER (*)    All other components within normal limits  BASIC METABOLIC PANEL -  Abnormal; Notable for the following:    Sodium 133 (*)    Chloride 99 (*)    Glucose, Bld 104 (*)    Calcium 8.8 (*)    All other components within normal limits  TROPONIN I  CBG MONITORING, ED    Imaging Review Dg Chest 2 View  10/29/2014   CLINICAL DATA:  Syncope  EXAM: CHEST  2 VIEW  COMPARISON:  None.  FINDINGS: Mild hyperinflation and COPD. Heart size and vascularity normal. Lungs are clear without infiltrate or effusion. No mass.  Generator pack over the left chest with leads extending cranially for deep brain stimulation.  IMPRESSION: Mild COPD.  No acute cardiopulmonary abnormality.   Electronically Signed   By: Franchot Gallo M.D.   On: 10/29/2014 13:11   Ct Head Wo Contrast  10/29/2014   CLINICAL DATA:  Syncopal episode today while standing in kitchen. Hit face on counter as he fell to the floor. Head pain. Face pain. History of Parkinson's disease with prior brain stimulator.  EXAM: CT HEAD WITHOUT CONTRAST  CT MAXILLOFACIAL WITHOUT CONTRAST  TECHNIQUE: Multidetector CT imaging of the head and maxillofacial structures were performed using the standard protocol without intravenous  contrast. Multiplanar CT image reconstructions of the maxillofacial structures were also generated.  COMPARISON:  None.  FINDINGS: CT HEAD FINDINGS  No evidence for acute stroke, acute hemorrhage, mass lesion, hydrocephalus, or extra-axial fluid. Normal cerebral volume without significant white matter disease.  BILATERAL deep brain stimulators approach the thalamus on the RIGHT and LEFT and appear uncomplicated. Both leads appear intact. No skull fracture. No sinus air-fluid level.  CT MAXILLOFACIAL FINDINGS  The mandible, maxilla, zygoma, and orbits are intact. There is nasal bone deformity on the RIGHT and LEFT with a displaced leftward fragment consistent with acute nasal fractures. LEFT nasal cavity hematoma. Nasal septal deviation LEFT-to-RIGHT appears chronic. BILATERAL inferior turbinate hypertrophy. Retention cyst RIGHT inferior maxillary sinus. Otherwise no significant sinus opacity or air-fluid level. No blowout injury. No orbital hematoma. Both globes are intact. TMJs are located. Aside from the nose, the extracranial soft tissues are unremarkable.  IMPRESSION: RIGHT and LEFT nasal bone fractures. Small displaced fracture fragment on the LEFT.  No skull fracture or intracranial hemorrhage. Unremarkable appearing deep brain stimulator leads.   Electronically Signed   By: Staci Righter M.D.   On: 10/29/2014 13:32   Ct Maxillofacial Wo Cm  10/29/2014   CLINICAL DATA:  Syncopal episode today while standing in kitchen. Hit face on counter as he fell to the floor. Head pain. Face pain. History of Parkinson's disease with prior brain stimulator.  EXAM: CT HEAD WITHOUT CONTRAST  CT MAXILLOFACIAL WITHOUT CONTRAST  TECHNIQUE: Multidetector CT imaging of the head and maxillofacial structures were performed using the standard protocol without intravenous contrast. Multiplanar CT image reconstructions of the maxillofacial structures were also generated.  COMPARISON:  None.  FINDINGS: CT HEAD FINDINGS  No evidence  for acute stroke, acute hemorrhage, mass lesion, hydrocephalus, or extra-axial fluid. Normal cerebral volume without significant white matter disease.  BILATERAL deep brain stimulators approach the thalamus on the RIGHT and LEFT and appear uncomplicated. Both leads appear intact. No skull fracture. No sinus air-fluid level.  CT MAXILLOFACIAL FINDINGS  The mandible, maxilla, zygoma, and orbits are intact. There is nasal bone deformity on the RIGHT and LEFT with a displaced leftward fragment consistent with acute nasal fractures. LEFT nasal cavity hematoma. Nasal septal deviation LEFT-to-RIGHT appears chronic. BILATERAL inferior turbinate hypertrophy. Retention cyst RIGHT inferior maxillary sinus.  Otherwise no significant sinus opacity or air-fluid level. No blowout injury. No orbital hematoma. Both globes are intact. TMJs are located. Aside from the nose, the extracranial soft tissues are unremarkable.  IMPRESSION: RIGHT and LEFT nasal bone fractures. Small displaced fracture fragment on the LEFT.  No skull fracture or intracranial hemorrhage. Unremarkable appearing deep brain stimulator leads.   Electronically Signed   By: Staci Righter M.D.   On: 10/29/2014 13:32     EKG Interpretation   Date/Time:  Monday October 29 2014 11:03:35 EDT Ventricular Rate:  60 PR Interval:    QRS Duration: 78 QT Interval:  404 QTC Calculation: 404 R Axis:   54 Text Interpretation:  Junctional rhythm Abnormal ECG No significant change  since last tracing Confirmed by YAO  MD, DAVID ([id number]) on 10/29/2014  1:02:08 PM      MDM   Final diagnoses:  Syncope  Nasal fracture, closed, initial encounter   69 year old male here with syncope at work. He had dizziness prior to event. Seems vasovagal in nature. Orthostatics here are appropriate. Labwork is reassuring-- initial BMP with hyperkalemia, repeated and is within normal limits.  EKG without acute ischemic changes. CT head negative for acute intracranial findings, stable  appearing deep brain stimulator leads. CT of face revealing bilateral nasal bone fractures. There is no noted septal hematoma or deformity on exam. Patient's tetanus is up-to-date. Nasal skin tear was repaired with Dermabond. Avulsion type laceration of left ring finger not requiring sutures. Patient continues to feel well without current complaints. He would like to go home, I feel this is appropriate. He has previously scheduled follow with his neurologist next week. I have given him follow-up with ENT regarding his nasal fractures. I offered pain medication for home, patient has had significant adverse reactions in the past and would prefer to take over-the-counter medications only.  Discussed plan with patient, he/she acknowledged understanding and agreed with plan of care.  Return precautions given for new or worsening symptoms.  Case discussed with attending physician, Dr. Darl Householder, who evaluated patient and agrees with assessment and plan of care.  Larene Pickett, PA-C 10/29/14 1449  Wandra Arthurs, MD 10/29/14 [phone]

## 2014-11-02 DIAGNOSIS — G20 Parkinson's disease: Secondary | ICD-10-CM | POA: Diagnosis not present

## 2014-11-02 DIAGNOSIS — Z9689 Presence of other specified functional implants: Secondary | ICD-10-CM | POA: Diagnosis not present

## 2014-11-05 DIAGNOSIS — J342 Deviated nasal septum: Secondary | ICD-10-CM | POA: Diagnosis not present

## 2014-11-05 DIAGNOSIS — S022XXG Fracture of nasal bones, subsequent encounter for fracture with delayed healing: Secondary | ICD-10-CM | POA: Diagnosis not present

## 2014-11-05 DIAGNOSIS — W19XXXD Unspecified fall, subsequent encounter: Secondary | ICD-10-CM | POA: Diagnosis not present

## 2014-11-05 DIAGNOSIS — Z87898 Personal history of other specified conditions: Secondary | ICD-10-CM | POA: Diagnosis not present

## 2014-11-07 ENCOUNTER — Ambulatory Visit (INDEPENDENT_AMBULATORY_CARE_PROVIDER_SITE_OTHER): Payer: Medicare Other | Admitting: Neurology

## 2014-11-07 ENCOUNTER — Encounter: Payer: Self-pay | Admitting: Neurology

## 2014-11-07 VITALS — BP 92/58 | HR 58 | Resp 16 | Ht 71.0 in | Wt 166.0 lb

## 2014-11-07 DIAGNOSIS — R55 Syncope and collapse: Secondary | ICD-10-CM

## 2014-11-07 DIAGNOSIS — S022XXS Fracture of nasal bones, sequela: Secondary | ICD-10-CM | POA: Diagnosis not present

## 2014-11-07 DIAGNOSIS — Z9689 Presence of other specified functional implants: Secondary | ICD-10-CM

## 2014-11-07 DIAGNOSIS — Z9889 Other specified postprocedural states: Secondary | ICD-10-CM

## 2014-11-07 DIAGNOSIS — G20 Parkinson's disease: Secondary | ICD-10-CM | POA: Diagnosis not present

## 2014-11-07 NOTE — Progress Notes (Signed)
Subjective:    Patient ID: Ronald Noble is a 69 y.o. male.  HPI    Interim history:   Ronald Noble is a 69 year old right-handed gentleman with an underlying medical history of skin cancer including melanoma, enlarged prostate status post TURP who presents for followup consultation of his R sided predominant Parkinson's disease, complicated by RBD and sleep disturbance as well as mild dyskinesias, now s/p bilateral DBS placement at [facility]. He is unaccompanied today. I last saw him on 08/07/2014, at which time he reported doing quite well after his DBS placement. His wife felt that he was doing great. He was eating well. He was not drinking enough water. He had trace edema in his distal lower extremities. He had 2 appointments for DBS programming. His Stalevo was reduced from 6 pills a day to 4 pills a day but then he increased it back to 5 pills a day. He was off generic Sinemet. He was on Azilect once daily and Requip long-acting 8 mg twice daily.   Today, 11/07/2014: He reports doing reasonably well. He still has nasal congestion. He has some trouble breathing through his nose. Thankfully he does not have much in the way of pain. Of note, unfortunately, he had a syncopal spell on 10/29/2014 and hit his nose as he fell and sustained a nasal fracture. I reviewed the emergency room records. He was referred to ENT. His laceration was repaired with Dermabond. He reports that he stood up and felt lightheaded and then passed out. He fell and hit his face. He had a CT head without contrast as well as maxillofacial CT without contrast on 10/29/2014 which showed: RIGHT and LEFT nasal bone fractures. Small displaced fracture fragment on the LEFT. No skull fracture or intracranial hemorrhage. Unremarkable appearing deep brain stimulator leads. In addition, I personally reviewed the images through the PACS system.he fell at work. He had taken Levitra at night and 2 days prior he was working hard in the yard. He  is notoriously not drinking enough water he admits. He had taken his Stalevo in the morning and was due for his next dose and became lightheaded when he got up quickly from his desk. He was working for about 45 minutes consistently at his desk. He felt lightheaded but did not have much in the way of warning sign otherwise and kept walking instead of sitting down again. He had a follow-up appointment at [facility] on 11/02/2014 and his Requip was increased slightly again. He had reprogramming as well. He had reduced the Requip XL to 3 mg twice daily but this was increased to 4 mg twice daily. His Stalevo is currently 4-6 pills daily depending on his work day. He does get sleepy at night around 9:30 and often falls asleep while watching TV. He tried Melatonin long-acting but in combination with clonazepam at night it did not work well and in fact he had insomnia. He tried to reduce his clonazepam to 0.25 mg each night but is now back on 0.5 mg each night. For his nose fracture he has seen Dr. Redmond Baseman about 5 days ago and is scheduled for a procedure 2 days from now.  Previously:   08/07/14: He's been back to work. He is currently on Azilect once daily, Requip XL 8 mg twice daily, Stalevo 100 mg strength one pill 5 times a day and he has stopped the carbidopa-levodopa. I reviewed his outside records from Dr. Salomon Fick at [facility] neurosurgery. Patient had  bilateral STN DBS electrodes placed on 06/01/2014. He had pulse generators placed approximately 3 weeks after that, 06/13/14.  I saw him on 02/01/2014, at which time he reported difficulty staying asleep. He had not consistently tried melatonin. He had problems with urinary urgency and was advised to try Kegel exercises by his urologist. His PCP talked to an infectious diseases specialist and it was determined he did not actually have Lyme disease.   He did not show for an appointment on 01/30/2014 - he genuinely forgot!   I saw  him on 12/19/2013, at which time he reported that his medication was not working as well. He reported more freezing episodes and more off time. Of note, he had been using a new protein milkshake that he was drinking first thing in the morning. He had lost some weight and in an effort to improve his weight loss he had started this new supplements. His primary care physician had referred him to a nutritionist. We also talked about DBS evaluation and he requested to see another specialist for DBS candidacy. I referred him to [name] at [facility] I also advised him to discontinue using the new protein supplement as I felt it may be interfering with the absorption of his Stalevo. I also suggested a trial of Parcopa half a pill 3 times a day at 11, 2 PM and 7 PM and we kept the Stalevo the same. He was advised to use MiraLAX as needed for constipation.   I saw him on 07/31/13, at which time [name], including swimming, yoga, and weights. He has been on Stalevo 100 mg every 2 1/2 hours starting at 7:30 AM for 6 doses. He stopped clonazepam, as he felt some daytime sluggishness. He has been taking C/L 1/2 tid in between. He had no cognitive issues, but did have some "downtime" midday. I did not change his medications. He had an appointment with [name] at [facility] in June, but there was a mix up with his appointment and he does not have an appointment until next year.   In the interim, he was diagnosed with Lyme d/s in July. He was tested and treated by his PCP has has been referred to an infectious d/s doctor. He brings in his latest lying test results from 12/05/2013 which I reviewed: It looks like his IgM antibody status is negative but his IgG antibody status has not quite turn positive as far as I understand the report. That means he is probably in the subacute or early chronic phase if I interpret this correctly. He requested a sooner than scheduled  appointment, due to feeling more freezing and more off time. This starts in the late morning hours but can also go into the afternoon after lunch. Cognitively and mood wise he feels stable. He does not sleep well at night. He has not consistently tried melatonin I recommended.   I saw him on 01/30/2013, at which time I felt he was stable. We talked about DBS surgery. I did not make any medication changes. He is aware of the increase risk of melanoma associated with DAs and his dermatologist is aware that he is taking Requip XL.   I first met him on 09/27/2012, which time I did not make any changes to his medication and did not order any new test as he was stable. He saw [name] at [facility] and discussed DBS in the past but was not deemed  a cadidate for it at the time as he was doing well. He was told by [name] to stay on schedule with his medication. 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 Sleep has been an issue. He goes to sleep well, but wakes up after 45 minutes to 1 hour. He is seeing a urologist. He exercises regularly.   He previously followed with [name] and was last seen by him on 05/25/2012, at which time [name] felt he was stable, but because of off time at 2 hours he increased his carbidopa-levodopa by adding half of a 10-100 mg strength 3 times a day to his regimen. He recommended decreasing his Stalevo to 125 mg from 100 mg 6 times a day. He has been on Stalevo 125 mg 6 times a day, at 7:30, 10, 12:30, 3 PM, 5:30 PM and 8 PM. He is on clonazepam 0.5 mg at night, Requip long-acting 6 mg twice daily, rasagiline 1 mg once daily, carbidopa-levodopa 10-100 mg strength half a tablet 3 times a day, 7:30 to 10, then midday and later in the evening.   He was followed by [name] since 2008 with a history of Parkinson's disease. He has a history of increased tone and slowness as well as tremor with R sided predominance. He was initially placed on Requip XL and then  rasagiline was added in October 2008. In February 2010 Stalevo was added. He had low vitamin D levels in 2008. ANA, ESR, ceruloplasmin were normal except for slightly low ceruloplasmin of 16. Urine copper test was negative. Methylmalonic acid was normal. Urine for heavy metals is normal as well. MRI brain with and without contrast on 01/07/2007 was normal. He had a sleep study in October 2008 showing PLMs and evidence of RBD. Repeat sleep study in September 2011 showed supine obstructive sleep apnea and RBD. He had a CPAP titration study in late September 2011 which was not very successful. He has been on clonazepam at night. He has not noticed any compulsive behaviors, memory loss, depression, swelling, or orthostatic dizziness. He recently had a skin biopsy showing atypical cells, concerning for melanoma.   He works full-time as an Forensic psychologist, [employer]. He exercises regularly and does yoga.    His Past Medical History Is Significant For: Past Medical History  Diagnosis Date  . Parkinson disease   . Sleep apnea     mild  . BPH (benign prostatic hyperplasia)   . Skin cancer of face     arms and back  . Spider bite     brown recluse/ left ankle/08/13    His Past Surgical History Is Significant For: Past Surgical History  Procedure Laterality Date  . Transurethral resection of prostate      2008  . Knee surgery      exploratory/left  . Colonoscopy    . Skin cancer excision  01/2014    face  . Burr hole w/ stereotactic insertion of dbs leads / intraop microelectrode recording      His Family History Is Significant For: Family History  Problem Relation Age of Onset  . COPD Mother   . Cancer Father     His Social History Is Significant For: History   Social History  . Marital Status: Married    Spouse Name: Ronald Noble  . Number of Children: 4  . Years of Education: 39   Occupational History  . LAWYER    Social History  Main Topics  . Smoking status: Never Smoker   .  Smokeless tobacco: Never Used  . Alcohol Use: 3.0 oz/week    3 Glasses of wine, 2 Cans of beer per week     Comment: occasional  . Drug Use: No  . Sexual Activity: Not on file   Other Topics Concern  . None   Social History Narrative   Patient is right handed and resides with wife   1-2 cups of coffee a day     His Allergies Are:  No Known Allergies:   His Current Medications Are:  Outpatient Encounter Prescriptions as of 11/07/2014  Medication Sig  . clonazePAM (KLONOPIN) 0.5 MG tablet Take 0.5 mg by mouth at bedtime as needed. sleep  . desmopressin (DDAVP) 0.2 MG tablet Take 0.6 mg by mouth at bedtime.  . rasagiline (AZILECT) 1 MG TABS tablet Take 1 tablet (1 mg total) by mouth daily.  Marland Kitchen rOPINIRole (REQUIP XL) 4 MG 24 hr tablet   . STALEVO 100 25-100-200 MG per tablet Take 1 tablet by mouth 6 (six) times daily.  . vardenafil (LEVITRA) 20 MG tablet Take 20 mg by mouth daily as needed for erectile dysfunction.   . [DISCONTINUED] Melatonin CR 3 MG TBCR Take 1 to 2 pills, 1 hour before bedtime as needed. (Patient not taking: Reported on 10/29/2014)  . [DISCONTINUED] Ropinirole HCl 6 MG TB24 Take 6 mg by mouth 2 (two) times daily.  . [DISCONTINUED] solifenacin (VESICARE) 5 MG tablet Take 1 tablet (5 mg total) by mouth daily. (Patient not taking: Reported on 10/29/2014)   No facility-administered encounter medications on file as of 11/07/2014.  :  Review of Systems:  Out of a complete 14 point review of systems, all are reviewed and negative with the exception of these symptoms as listed below:  Review of Systems  Neurological:       Recent syncope last Monday, treated at [facility].    Fractured nose, has ENT procedure with Dr. Redmond Baseman in 2 days.   Objective:  Neurologic Exam  Physical Exam Physical Examination:   Filed Vitals:   11/07/14 1525  BP: 92/58  Pulse: 58  Resp: 16   General Examination: The patient is a very pleasant 69 y.o. male in no acute distress.   HEENT:  Normocephalic, atraumatic, pupils are equal, round and reactive to light and accommodation. Extraocular tracking shows mild saccadic breakdown without nystagmus noted. There is no limitation to his gaze. There is mild decrease in eye blink rate. Hearing is intact. Face is symmetric with mild facial masking and normal facial sensation. There is no lip, neck or jaw tremor. Neck is moderately rigid with intact passive ROM. There are no carotid bruits on auscultation. Oropharynx exam reveals mild mouth dryness. No significant airway crowding is noted. Mallampati is class II. Tongue protrudes centrally and palate elevates symmetrically. There is no drooling. There are mild generalized dyskinesias. His stimulator wires are tunneled on the left side.   Chest: is clear to auscultation without wheezing, rhonchi or crackles noted. His generator site looks fine. Skin has healed well.  Heart: sounds are regular and normal without murmurs, rubs or gallops noted.   Abdomen: is soft, non-tender and non-distended with normal bowel sounds appreciated on auscultation.  Extremities: There is no trace edema in the distal lower extremities bilaterally.   Skin: is warm and dry with no trophic changes noted. Age-related changes are noted on the skin and scars from skin cancer rem.   Musculoskeletal: exam  reveals no obvious joint deformities, tenderness, joint swelling or erythema.  Neurologically:  Mental status: The patient is awake and alert, paying good  attention. He is able to completely provide the history. He is oriented to: person, place, time/date, situation, day of week, month of year and year. His memory, attention, language and knowledge are intact. There is no aphasia, agnosia, apraxia or anomia. There is a no significant degree of bradyphrenia. Speech is mildly hypophonic with no dysarthria noted. Mood is congruent and affect is normal.   Cranial nerves are as described above under HEENT exam. In addition,  shoulder shrug is normal with equal shoulder height noted.  Motor exam: Normal bulk, and strength for age is noted. There are very mild generalized dyskinesias noted, these are intermittent and better than last time.    Tone is mildly rigid with presence of cogwheeling in the right upper extremity. There is overall mild bradykinesia. There is no drift or rebound.  There is a mild resting tremor in the right upper extremity. The tremor is intermittent.  Romberg is negative.  Reflexes are 1+ in the upper extremities and 1+ in the lower extremities. Toes are downgoing bilaterally.  Fine motor skills exam:  Fine motor skills are mild to moderately impaired. Cerebellar testing shows no dysmetria or intention tremor on finger to nose testing. Heel to shin is unremarkable bilaterally. There is no truncal or gait ataxia.   Sensory exam is intact to light touch throughout .   Gait, station and balance: He stands up from the seated position with no significant difficulty and does not need to push up with His hands. He needs no assistance. No veering to one side is noted. He is not noted to lean to the side. Posture is mildly stooped, advanced for age. Stance is narrow-based. He walks with fairly good pace and stride length and fairly good arm swing bilaterally today. He has slight difficulty turning. Balance is minimally impaired.     Assessment and Plan:   In summary, Ronald Noble is a very pleasant 69 year old male with a history of right-sided predominant Parkinson's disease, complicated by RBD, sleep disturbance, generalized dyskinesias, motor fluctuations, constipation and bladder hyperactivity, now s/p b/l DBS placement at [facility] on 06/01/14 and generator placement on 06/13/2014. Overall, he was doing well, but had a recent set back due to  syncope and collapse and fractured nose. His blood pressure is low today. I suggested no medication changes. He had a recent checkup at [facility]. He has a follow-up scheduled with [name] next month on 12/26/2014. Medication wise asked him to continue with the same regimen which is Stalevo 1 pill 4-6 times a day and Requip XL 4 mg twice daily. I suggested he try to drink more water and be more mindful about his caloric intake on a regular basis. He is advised to start monitoring his blood pressure once or twice a week or when he feels lightheaded. If it consistently stays in the 90s over 50s or 60s we may have to address this with other measures including compression stockings and/or consideration of a medication for hypotension. From the DBS standpoint he has done really well. He looks well otherwise today. I suggested a 3 to four-month follow-up here and he is encouraged to call with any interim questions or concerns.   I answered all his questions today and he was in agreement. I spent 25 minutes in total face-to-face time with the patient,  more than 50% of which was spent in counseling and coordination of care, reviewing test results, reviewing medication and discussing or reviewing the diagnosis of PD,  hypotension and syncope, the prognosis and treatment adjustments down the road.

## 2014-11-07 NOTE — Patient Instructions (Addendum)
Please drink more water.  Please change positions slowly and always stay well-nourished.  You may want to monitor your BP at home, about 1 to 2 times a week, at the same time or if you feel lightheaded. Keep a log. If you have consistently low BP in the 90/50s or 90s/60s, we may need to consider other measures to keep your blood pressure values up, such as compression stockings to the legs and even medication for low BP.

## 2014-11-09 DIAGNOSIS — S022XXG Fracture of nasal bones, subsequent encounter for fracture with delayed healing: Secondary | ICD-10-CM | POA: Diagnosis not present

## 2014-11-14 ENCOUNTER — Telehealth: Payer: Self-pay | Admitting: Neurology

## 2014-11-14 ENCOUNTER — Other Ambulatory Visit: Payer: Self-pay | Admitting: Neurology

## 2014-11-14 MED ORDER — CARBIDOPA-LEVODOPA-ENTACAPONE 25-100-200 MG PO TABS: ORAL_TABLET | ORAL | Status: DC

## 2014-11-14 NOTE — Telephone Encounter (Signed)
Patient called requesting refill for STALEVO 100 25-100-200 MG per tablet . He states he has contacted pharmacy and was told it could up to 5 days. He has about 7 days left and is trying to expedite since it is mail order. Patient can be reached at [phone].

## 2014-11-14 NOTE — Telephone Encounter (Signed)
Last OV note says: Medication wise asked him to continue with the same regimen which is Stalevo 1 pill 4-6 times a day Rx has been sent.  Receipt confirmed by pharmacy.  I called the patient back to advise.  Got no answer.  Left message.

## 2014-12-06 DIAGNOSIS — H93293 Other abnormal auditory perceptions, bilateral: Secondary | ICD-10-CM | POA: Diagnosis not present

## 2014-12-25 DIAGNOSIS — Z23 Encounter for immunization: Secondary | ICD-10-CM | POA: Diagnosis not present

## 2014-12-26 DIAGNOSIS — Z79899 Other long term (current) drug therapy: Secondary | ICD-10-CM | POA: Diagnosis not present

## 2014-12-26 DIAGNOSIS — Z7189 Other specified counseling: Secondary | ICD-10-CM | POA: Diagnosis not present

## 2014-12-26 DIAGNOSIS — R42 Dizziness and giddiness: Secondary | ICD-10-CM | POA: Diagnosis not present

## 2014-12-26 DIAGNOSIS — G20 Parkinson's disease: Secondary | ICD-10-CM | POA: Diagnosis not present

## 2015-01-03 ENCOUNTER — Telehealth: Payer: Self-pay | Admitting: Neurology

## 2015-01-03 NOTE — Telephone Encounter (Signed)
I spoke to patient to clarify order. We had sent refill to Mail order in August.  He called on 11/14/14 to ask for temp Rx be sent to [facility] in order to last him until he got mail order medication. Ronald Noble said that he will call his mail order to see if they can send him the refill and he will call back to let me know what they said.

## 2015-01-03 NOTE — Telephone Encounter (Signed)
Pt needs refill on rasagiline (AZILECT) 1 MG TABS tablet, [facility]. Thank you

## 2015-01-03 NOTE — Telephone Encounter (Signed)
Called to check status but no answer and no vm

## 2015-01-03 NOTE — Telephone Encounter (Signed)
I spoke to [place], he confirmed that mail order pharmacy will send it

## 2015-01-09 ENCOUNTER — Ambulatory Visit: Payer: Medicare Other | Attending: Nurse Practitioner | Admitting: Physical Therapy

## 2015-01-09 DIAGNOSIS — R293 Abnormal posture: Secondary | ICD-10-CM | POA: Diagnosis not present

## 2015-01-09 DIAGNOSIS — R269 Unspecified abnormalities of gait and mobility: Secondary | ICD-10-CM | POA: Insufficient documentation

## 2015-01-09 DIAGNOSIS — R2689 Other abnormalities of gait and mobility: Secondary | ICD-10-CM | POA: Insufficient documentation

## 2015-01-10 NOTE — Therapy (Signed)
[name], [name], [address] Phone: [phone]   Fax:  [phone]  Physical Therapy Evaluation  Patient Details  Name: Ronald Noble MRN: [id number] Date of Birth: 16-Jun-1945 Referring Provider:  Pura Spice,*  Encounter Date: 01/09/2015      PT End of Session - 01/10/15 1301    Visit Number 1   Number of Visits 9   Date for PT Re-Evaluation 03/10/15   Authorization Type Medicare; BCBS secondary-G-code every 10th visit   PT Start Time 0846   PT Stop Time 0935   PT Time Calculation (min) 49 min   Activity Tolerance Patient tolerated treatment well   Behavior During Therapy [facility] for tasks assessed/performed      Past Medical History  Diagnosis Date  . Parkinson disease   . Sleep apnea     mild  . BPH (benign prostatic hyperplasia)   . Skin cancer of face     arms and back  . Spider bite     brown recluse/ left ankle/08/13    Past Surgical History  Procedure Laterality Date  . Transurethral resection of prostate      2008  . Knee surgery      exploratory/left  . Colonoscopy    . Skin cancer excision  01/2014    face  . Burr hole w/ stereotactic insertion of dbs leads / intraop microelectrode recording      There were no vitals filed for this visit.  Visit Diagnosis:  Abnormality of gait  Posture abnormality  Festinating gait      Subjective Assessment - 01/09/15 0852    Subjective Pt is a 69 year old male who has had Parkinson's disease for at least 8 years.  He is a patient of Dr. Guadelupe Sabin, who was evaluated by [name] for deep brain stimulation.  He had DBS placement in February-March 2016.  He notes increased difficulty with movement on R side.  He has been going for readjustments on DBS monthly.  Prior to surgery, he was taking increasing dosages of medications; since the surgery, he notes increase ease of movement, especially with swimming and  exercise activities.  He notes having 2 falls since DBS (reports blackouts).  He has difficulty with gait, stuttering steps.     Pertinent History DBS Feb-March 2016   Patient Stated Goals Pt's goal for therapy is to improve foot and leg movement, walking, and improve posture.   Currently in Pain? Yes  PT to attempt to address through exercises/education   Pain Score 2    Pain Location Back   Pain Orientation Lower;Mid;Left   Pain Descriptors / Indicators Nagging   Pain Frequency Intermittent   Aggravating Factors  intervals during the day-wearing off time with medication   Pain Relieving Factors yoga; better in the morning            [facility] PT Assessment - 01/09/15 0905    Assessment   Medical Diagnosis Parkinson's disease   Onset Date/Surgical Date --  DBS Feb-March 2016   Precautions   Precautions Fall  DBS bilateral placement   Balance Screen   Has the patient fallen in the past 6 months Yes   How many times? 2   Has the patient had a decrease in activity level because of a fear of falling?  No   Is the patient reluctant to leave their home because of a fear of falling?  No   Home Environment  Living Environment Private residence   Living Arrangements Spouse/significant other   Available Help at Discharge Family   Type of [place] to enter   Entrance Stairs-Number of Steps 3   [place] One level   Prior Function   Level of Independence Independent with basic ADLs;Independent with household mobility without device;Independent with community mobility without device   Vocation Full time employment  Lawyer   Observation/Other Assessments   Focus on Therapeutic Outcomes (FOTO)  Staff did not capture   Posture/Postural Control   Posture/Postural Control Postural limitations   Postural Limitations Rounded Shoulders;Weight shift left  Lean to R in in sitting   ROM / Strength   AROM / PROM / Strength Strength   Strength   Overall Strength  Comments grossly tested at least 4+/5 bilateral lower extremities   Transfers   Transfers Sit to Stand;Stand to Sit   Sit to Stand 6: Modified independent (Device/Increase time);From chair/3-in-1   Five time sit to stand comments  8.53 sec with minimal posterior lean   Stand to Sit 6: Modified independent (Device/Increase time);To chair/3-in-1   Ambulation/Gait   Ambulation/Gait Yes   Ambulation/Gait Assistance 5: Supervision   Ambulation/Gait Assistance Details decreased arm swing with gait, decreased trunk rotation, decreased environmental scanning with gait   Ambulation Distance (Feet) 200 Feet   Assistive device None   Gait Pattern Step-to pattern;Decreased arm swing - right;Decreased arm swing - left;Decreased step length - right;Decreased step length - left;Decreased dorsiflexion - right;Decreased dorsiflexion - left;Festinating;Decreased trunk rotation;Narrow base of support;Poor foot clearance - left;Poor foot clearance - right   Ambulation Surface Level;Indoor   Gait velocity 8.69 sec = 3.77 ft/sec   Standardized Balance Assessment   Standardized Balance Assessment Timed Up and Go Test   Timed Up and Go Test   Normal TUG (seconds) 10.66   Cognitive TUG (seconds) 10.05  decr foot clearance, arm swing, heelstrike   High Level Balance   High Level Balance Comments Push and release test-posterior 2 steps to regain balance with supervision; lateral push and release test R-multiple steps to R, needs therapist assist to regain balance    Functional Gait  Assessment   Gait assessed  Yes   Gait Level Surface Walks 20 ft in less than 7 sec but greater than 5.5 sec, uses assistive device, slower speed, mild gait deviations, or deviates 6-10 in outside of the 12 in walkway width.  5.69   Change in Gait Speed Able to change speed, demonstrates mild gait deviations, deviates 6-10 in outside of the 12 in walkway width, or no gait deviations, unable to achieve a major change in velocity, or uses  a change in velocity, or uses an assistive device.   Gait with Horizontal Head Turns Performs head turns smoothly with slight change in gait velocity (eg, minor disruption to smooth gait path), deviates 6-10 in outside 12 in walkway width, or uses an assistive device.   Gait with Vertical Head Turns Performs task with slight change in gait velocity (eg, minor disruption to smooth gait path), deviates 6 - 10 in outside 12 in walkway width or uses assistive device   Gait and Pivot Turn Pivot turns safely in greater than 3 sec and stops with no loss of balance, or pivot turns safely within 3 sec and stops with mild imbalance, requires small steps to catch balance.  2.04   Step Over Obstacle Is able to step over one shoe box (4.5 in total height)  but must slow down and adjust steps to clear box safely. May require verbal cueing.   Gait with Narrow Base of Support Is able to ambulate for 10 steps heel to toe with no staggering.   Gait with Eyes Closed Walks 20 ft, uses assistive device, slower speed, mild gait deviations, deviates 6-10 in outside 12 in walkway width. Ambulates 20 ft in less than 9 sec but greater than 7 sec.   Ambulating Backwards Walks 20 ft, slow speed, abnormal gait pattern, evidence for imbalance, deviates 10-15 in outside 12 in walkway width.  15.48 sec in 20 ft   Steps Alternating feet, must use rail.   Total Score 19   FGA comment: Scores < 22/30 indicate increased fall risk                                PT Long Term Goals - 01/10/15 1408    PT LONG TERM GOAL #1   Title Pt will be independent with Parkinson's specific HEP for improved functional mobility and gait and for decreased pain.  TARGET 02/08/15   Time 4   Period Weeks   Status New   PT LONG TERM GOAL #2   Title Pt will improve Functional Gait Assessment to at least 22/30 for decreased fall risk/improved functional mobility.   Time 4   Period Weeks   Status New   PT LONG TERM GOAL #3    Title Pt will perform at least 8 of 10 reps of sit<>stand transfers from low surfaces (<18 inches) with no evidence of posterior lean for improved transfer efficiency and safety.   Time 4   Period Weeks   Status New   PT LONG TERM GOAL #4   Title Pt will verbalize/demonstrate at least 3 means to decrease festinating with gait.   Time 4   Period Weeks   Status New   PT LONG TERM GOAL #5   Title Pt will verbalize understanding of local Parkinson's disease related resources.   Time 4   Period Weeks   Additional Long Term Goals   Additional Long Term Goals Yes   PT LONG TERM GOAL #6   Title Pt will demonstrate improved balance by taking 2 or less steps in push and release test to lateral and posterior directions.   Time 4   Period Weeks   Status New               Plan - 01/10/15 1302    Clinical Impression Statement Pt is a 69 year old male who presents to OP PT with diagnosis of Parkinson's disease status post deep brain stimulator surgery in Feb-March 2016.  He has had two falls in the past 6 months, where he experienced "black-outs" causing the falls.  He notes some episodes of shuffling and festinating with initiation of gait.  He also notes increased difficulty with mobility and ease of movement later in the day.  Pt presents with the following deficits:  postural instability, decreased balance strategies, decreased timing and coordination with gait, decreased coordination with turns and gait, festination with gait, pain.  Pt would benefit from skilled PT to address the above stated deficits.   Pt will benefit from skilled therapeutic intervention in order to improve on the following deficits Abnormal gait;Decreased coordination;Decreased balance;Difficulty walking;Postural dysfunction;Pain   Rehab Potential Good   PT Frequency 2x / week   PT Duration 4 weeks  plus eval  PT Treatment/Interventions ADLs/Self Care Home Management;Therapeutic exercise;Therapeutic  activities;Functional mobility training;Gait training;Balance training;Neuromuscular re-education;Patient/family education   PT Next Visit Plan Initiate HEP for PWR! Moves-sitting, standing, quadruped; check orthostatic blood pressures, check visual acuity/head and eye movements   Consulted and Agree with Plan of Care Patient          G-Codes - 01-28-2015 1415    Functional Assessment Tool Used >2 steps in push and release test posterior and lateral (needs therapist assist for balance); Functional Gait Assessment 19/30   Functional Limitation Mobility: Walking and moving around   Mobility: Walking and Moving Around Current Status [phone]) At least 20 percent but less than 40 percent impaired, limited or restricted   Mobility: Walking and Moving Around Goal Status [phone]) At least 1 percent but less than 20 percent impaired, limited or restricted       Problem List Patient Active Problem List   Diagnosis Date Noted  . Parkinson disease ([place])     Anav Lammert W. 01/10/2015, 2:18 PM [facility] [address] [place], [name], [address] Phone: [phone]   Fax:  [phone]

## 2015-01-15 ENCOUNTER — Ambulatory Visit: Payer: Medicare Other | Admitting: Physical Therapy

## 2015-01-15 DIAGNOSIS — R293 Abnormal posture: Secondary | ICD-10-CM | POA: Diagnosis not present

## 2015-01-15 DIAGNOSIS — R2689 Other abnormalities of gait and mobility: Secondary | ICD-10-CM | POA: Diagnosis not present

## 2015-01-15 DIAGNOSIS — R269 Unspecified abnormalities of gait and mobility: Secondary | ICD-10-CM | POA: Diagnosis not present

## 2015-01-15 NOTE — Patient Instructions (Signed)
Provided patient with PWR! Based handouts on PWR! Moves Basic 4 in sitting:  -PWR! Up x 20  -PWR! Rock x 20  -PWR! Twist x 20  -PWR! Step x 20   To be performed once daily

## 2015-01-16 ENCOUNTER — Ambulatory Visit: Payer: Medicare Other | Admitting: Physical Therapy

## 2015-01-16 DIAGNOSIS — R293 Abnormal posture: Secondary | ICD-10-CM | POA: Diagnosis not present

## 2015-01-16 DIAGNOSIS — R269 Unspecified abnormalities of gait and mobility: Secondary | ICD-10-CM

## 2015-01-16 DIAGNOSIS — R2689 Other abnormalities of gait and mobility: Secondary | ICD-10-CM | POA: Diagnosis not present

## 2015-01-16 NOTE — Patient Instructions (Signed)
Computer Work    Position work to Programmer, multimedia. Use proper work and seat height. Keep shoulders back and down, wrists straight, and elbows at right angles. Use chair that provides full back support. Add footrest and lumbar roll as needed.   Copyright  VHI. All rights reserved.  Posture - Sitting    Sit upright, head facing forward. Try using a roll to support lower back. Keep shoulders relaxed, and avoid rounded back. Keep hips level with knees. Avoid crossing legs for long periods.   Copyright  VHI. All rights reserved.    Seated pelvic tilt posture exercise picture provided to patient -Scoot out to edge of the chair -Perform gentle rocking of pelvis-anterior and posterior pelvic tilts 10 reps -This can be done through the day to ease discomfort from sitting too long at office chair

## 2015-01-16 NOTE — Therapy (Signed)
[facility] [address] [place], [name], [address] Phone: [phone]   Fax:  [phone]  Physical Therapy Treatment  Patient Details  Name: Ronald Noble MRN: [id number] Date of Birth: Aug 15, 1945 Referring Provider:  [name], MD  Encounter Date: 01/15/2015      PT End of Session - 01/15/15 1308    Visit Number 2   Number of Visits 9   Date for PT Re-Evaluation 03/10/15   Authorization Type Medicare; BCBS secondary-G-code every 10th visit   PT Start Time 0802   PT Stop Time 0845   PT Time Calculation (min) 43 min   Activity Tolerance Patient tolerated treatment well   Behavior During Therapy [facility] for tasks assessed/performed      Past Medical History  Diagnosis Date  . Parkinson disease   . Sleep apnea     mild  . BPH (benign prostatic hyperplasia)   . Skin cancer of face     arms and back  . Spider bite     brown recluse/ left ankle/08/13    Past Surgical History  Procedure Laterality Date  . Transurethral resection of prostate      2008  . Knee surgery      exploratory/left  . Colonoscopy    . Skin cancer excision  01/2014    face  . Burr hole w/ stereotactic insertion of dbs leads / intraop microelectrode recording      There were no vitals filed for this visit.  Visit Diagnosis:  Abnormality of gait  Posture abnormality      Subjective Assessment - 01/15/15 0805    Subjective No changes from last visit.  Did some yardwork over the weekend-have a little pain in R shoulder.  Haven't taken meds this morning-typical for me in the mornings pre-exercise.   Currently in Pain? Yes   Pain Score 3    Pain Location Shoulder   Pain Orientation Right   Pain Descriptors / Indicators --  Pulled muscle type pain   Pain Onset In the past 7 days   Pain Frequency Intermittent   Aggravating Factors  certain movements   Pain Relieving Factors hasn't tried anything                   Self Care:  Checked orthostatic BP measurements: Supine after 5 minutes:  160/82 Stand after 1 minute 150/81 with initial c/o lightheadedness Stand after 3 minutes 143/76 no additional complaints.  Discussed general fluid intake as well as slowed change in positions with active ROM movements in lower extremities to acclimate to positions.  Recommended if patient has additional complaints or changes, to let MD know.  Checked static and dynamic visual acuity with 1 line difference, though pt has difficulty relaxing for head turns.  Discussed results of measurements.  Discussed need to continue to monitor if patient continues to have symptoms.            PWR [facility]) - 01/15/15 9741    PWR! exercises Moves in sitting   PWR! Up x 10   PWR! Rock x 10    PWR! Twist x 10    PWR! Step x 10   Comments Visual and verbal cues provided for technique, amplitude, attention to deliberate movement       PWR! Up for posture, PWR! Rock for [employer], [place]! Twist for trunk rotation, and PWR! Step for step initiation.  Cues provided for amplitude, technique and connection to functional movement patterns.  PT Education - 01/15/15 1307    Education provided Yes   Education Details HEP-PWR! Moves in sitting   Person(s) Educated Patient   Methods Explanation;Demonstration;Handout   Comprehension Verbalized understanding;Returned demonstration             PT Long Term Goals - 01/10/15 1408    PT LONG TERM GOAL #1   Title Pt will be independent with Parkinson's specific HEP for improved functional mobility and gait and for decreased pain.  TARGET 02/08/15   Time 4   Period Weeks   Status New   PT LONG TERM GOAL #2   Title Pt will improve Functional Gait Assessment to at least 22/30 for decreased fall risk/improved functional mobility.   Time 4   Period Weeks   Status New   PT LONG TERM GOAL #3   Title Pt will perform at least 8 of 10 reps of  sit<>stand transfers from low surfaces (<18 inches) with no evidence of posterior lean for improved transfer efficiency and safety.   Time 4   Period Weeks   Status New   PT LONG TERM GOAL #4   Title Pt will verbalize/demonstrate at least 3 means to decrease festinating with gait.   Time 4   Period Weeks   Status New   PT LONG TERM GOAL #5   Title Pt will verbalize understanding of local Parkinson's disease related resources.   Time 4   Period Weeks   Additional Long Term Goals   Additional Long Term Goals Yes   PT LONG TERM GOAL #6   Title Pt will demonstrate improved balance by taking 2 or less steps in push and release test to lateral and posterior directions.   Time 4   Period Weeks   Status New               Plan - 01/15/15 1310    Clinical Impression Statement Briefly assessed potential reasons for patient's complaints of lightheadedness/dizziness at times with gait.  Orthostatic blood pressure measurements appear within functional limits; Had difficulty with dynamic visual acuity test due to pt not relaxing during head turns, with pt having 1 line difference on static and dynamic visual acuity test.  Will continue to asses pt's complaints with dizziness with progression of gait and dynamic balance activities.  Initiated PWR! MOves for HEP today.   Pt will benefit from skilled therapeutic intervention in order to improve on the following deficits Abnormal gait;Decreased coordination;Decreased balance;Difficulty walking;Postural dysfunction;Pain   Rehab Potential Good   PT Frequency 2x / week   PT Duration 4 weeks  plus eval   PT Treatment/Interventions ADLs/Self Care Home Management;Therapeutic exercise;Therapeutic activities;Functional mobility training;Gait training;Balance training;Neuromuscular re-education;Patient/family education   PT Next Visit Plan Review seated PWR! Moves, add standing/quadruped PWR! Moves; gait and balance activities; per pt request-body  mechanics with office chair/positioning   Consulted and Agree with Plan of Care Patient        Problem List Patient Active Problem List   Diagnosis Date Noted  . Parkinson disease ([place])     RonaldAMY W. 01/16/2015, 8:00 AM  Frazier Butt., PT  [facility] [address] [place], [name], [address] Phone: [phone]   Fax:  [phone]

## 2015-01-16 NOTE — Therapy (Signed)
[facility] [address] [place], [name], [address] Phone: [phone]   Fax:  [phone]  Physical Therapy Treatment  Patient Details  Name: Ronald Noble MRN: [id number] Date of Birth: 03-Jun-1945 Referring Provider:  [name], MD  Encounter Date: 01/16/2015      PT End of Session - 01/16/15 0953    Visit Number 3   Number of Visits 9   PT Start Time 0806   PT Stop Time 0847   PT Time Calculation (min) 41 min   Activity Tolerance Patient tolerated treatment well   Behavior During Therapy [facility] for tasks assessed/performed      Past Medical History  Diagnosis Date  . Parkinson disease   . Sleep apnea     mild  . BPH (benign prostatic hyperplasia)   . Skin cancer of face     arms and back  . Spider bite     brown recluse/ left ankle/08/13    Past Surgical History  Procedure Laterality Date  . Transurethral resection of prostate      2008  . Knee surgery      exploratory/left  . Colonoscopy    . Skin cancer excision  01/2014    face  . Burr hole w/ stereotactic insertion of dbs leads / intraop microelectrode recording      There were no vitals filed for this visit.  Visit Diagnosis:  Posture abnormality  Abnormality of gait      Subjective Assessment - 01/16/15 1001    Subjective Have already gotten up and swam this morning.  No meds yet this morning.  Feeling pretty good, but haven't had time to do my exercises from yesterday.  Pt brings in picture of his office chair for questions on office set-up.   Currently in Pain? No/denies                   Self Care:  Provided patient with education/discussed optimal body positioning and posture at work station-including arms, legs at 90 degree angles from workspaces.  Discussed use of foot stool if needed for optimal foot support/position.  Discussed/demonstrated lumbar roll positioning.  Seated anterior/posterior pelvic tilts  x 10 reps for positioning and weightshifting during work activities.  Discussed use of various stretches/weigththshift positions and taking standing breaks during seated activities at work throughout the day.         PWR [facility]) - 01/16/15 0825    PWR! exercises Moves in sitting;Moves in standing   PWR! Up 20   PWR! Rock 20   PWR! Twist 20   PWR Step 20   Comments Performed with verbal and visual cues for technique for large amplitude, deliberate movement patterns   PWR! Up 20   PWR! Rock 20   PWR! Twist 20   PWR! Step 20   Comments Reviewed as part of HEP             PT Education - 01/16/15 0952    Education provided Yes   Education Details HEP-PWR! 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  TARGET  02/08/15   Time 4   Period Weeks   Status New   PT LONG TERM GOAL #2   Title Pt will improve Functional Gait Assessment to at least 22/30 for decreased fall risk/improved functional mobility.   Time 4   Period Weeks   Status New   PT LONG TERM GOAL #3   Title Pt will perform at least 8 of 10 reps of sit<>stand transfers from low surfaces (<18 inches) with no evidence of posterior lean for improved transfer efficiency and safety.   Time 4   Period Weeks   Status New   PT LONG TERM GOAL #4   Title Pt will verbalize/demonstrate at least 3 means to decrease festinating with gait.   Time 4   Period Weeks   Status New   PT LONG TERM GOAL #5   Title Pt will verbalize understanding of local Parkinson's disease related resources.   Time 4   Period Weeks   Additional Long Term Goals   Additional  Long Term Goals Yes   PT LONG TERM GOAL #6   Title Pt will demonstrate improved balance by taking 2 or less steps in push and release test to lateral and posterior directions.   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  Reiterated importance of awareness to posture and ways to incorporate PWR! Moves into functional patterns through the day for better posture and movement patterns.   Pt will benefit from skilled therapeutic intervention in order to improve on the following deficits Abnormal gait;Decreased coordination;Decreased balance;Difficulty walking;Postural dysfunction;Pain   Rehab Potential Good   PT Frequency 2x / week   PT Duration 4 weeks  plus eval   PT Treatment/Interventions ADLs/Self Care Home Management;Therapeutic exercise;Therapeutic activities;Functional mobility training;Gait training;Balance training;Neuromuscular re-education;Patient/family education   PT Next Visit Plan Review standing PWR! Moves; add forward/back step and weightshift; work on tips to reduce freezing/festinating with gait/anterior/posterior weigthshifting with gait stop/starts   Consulted and Agree with Plan of Care Patient        Problem List Patient Active Problem List   Diagnosis Date Noted  . Parkinson disease ([place])     Oran Dillenburg W. 01/16/2015, 10:02 AM  Frazier Butt., PT [facility] [address] [place], [name], [address] Phone: [phone]   Fax:  [phone]

## 2015-01-17 DIAGNOSIS — E785 Hyperlipidemia, unspecified: Secondary | ICD-10-CM | POA: Diagnosis not present

## 2015-01-17 DIAGNOSIS — Z125 Encounter for screening for malignant neoplasm of prostate: Secondary | ICD-10-CM | POA: Diagnosis not present

## 2015-01-17 DIAGNOSIS — E559 Vitamin D deficiency, unspecified: Secondary | ICD-10-CM | POA: Diagnosis not present

## 2015-01-22 ENCOUNTER — Ambulatory Visit: Payer: Medicare Other | Admitting: Physical Therapy

## 2015-01-22 DIAGNOSIS — R2689 Other abnormalities of gait and mobility: Secondary | ICD-10-CM | POA: Diagnosis not present

## 2015-01-22 DIAGNOSIS — R269 Unspecified abnormalities of gait and mobility: Secondary | ICD-10-CM | POA: Diagnosis not present

## 2015-01-22 DIAGNOSIS — R293 Abnormal posture: Secondary | ICD-10-CM | POA: Diagnosis not present

## 2015-01-22 NOTE — Therapy (Signed)
[facility] [address] [place], [name], [address] Phone: [phone]   Fax:  [phone]  Physical Therapy Treatment  Patient Details  Name: Ronald Noble MRN: [id number] Date of Birth: June 17, 1945 No Data Recorded  Encounter Date: 01/22/2015      PT End of Session - 01/22/15 0903    Visit Number 4   Number of Visits 9   Date for PT Re-Evaluation 03/10/15   Authorization Type Medicare; BCBS secondary-G-code every 10th visit   PT Start Time 0807   PT Stop Time 0850   PT Time Calculation (min) 43 min   Activity Tolerance Patient tolerated treatment well   Behavior During Therapy [facility] for tasks assessed/performed      Past Medical History  Diagnosis Date  . Parkinson disease   . Sleep apnea     mild  . BPH (benign prostatic hyperplasia)   . Skin cancer of face     arms and back  . Spider bite     brown recluse/ left ankle/08/13    Past Surgical History  Procedure Laterality Date  . Transurethral resection of prostate      2008  . Knee surgery      exploratory/left  . Colonoscopy    . Skin cancer excision  01/2014    face  . Burr hole w/ stereotactic insertion of dbs leads / intraop microelectrode recording      There were no vitals filed for this visit.  Visit Diagnosis:  Festinating gait      Subjective Assessment - 01/22/15 0811    Subjective Denies falls or changes since last visit. Has not done the exercises much.   Pertinent History DBS Feb-March 2016   Patient Stated Goals Pt's goal for therapy is to improve foot and leg movement, walking, and improve posture.   Currently in Pain? No/denies                         [facility] Adult PT Treatment/Exercise - 01/22/15 0908    Ambulation/Gait   Ambulation/Gait Yes   Ambulation/Gait Assistance 5: Supervision   Ambulation/Gait Assistance Details gait on treadmill with and without gait trainer working on heel strike, step  length, cadence, posture and foot clearance.  1.2-1.6 mph.  Kicking blue therapy ball for increased step length and heel strike.  Followed treadmill by gait on level ground both with and without walking poles working on same.   Ambulation Distance (Feet) 600 Feet  plus and treadmill x 9 minutes   Assistive device None  treadmill with UE support also   Gait Pattern Decreased arm swing - right;Decreased arm swing - left;Decreased step length - right;Decreased step length - left;Decreased dorsiflexion - right;Decreased dorsiflexion - left;Festinating;Decreased trunk rotation;Narrow base of support;Poor foot clearance - left;Poor foot clearance - right;Step-through pattern   Neuro Re-ed    Neuro Re-ed Details  Weight shifting at counter of forward, backward and forward/backward step thru weight shifting incorporating UE's as well.  Needs cues for intensity and posture and weight shift           PWR [facility]) - 01/22/15 0913    PWR! exercises Moves in standing   PWR! Up 20   PWR! Rock 20   PWR! Twist 20   PWR Step 20   Comments Performed in standing without UE support with cues for intensity and posture             PT Education -  01/22/15 0902    Education provided Yes   Education Details Gait with intensity and walking poles, weight shifting activities, tips to reduce freezing   Person(s) Educated Patient   Methods Explanation;Demonstration   Comprehension Verbalized understanding             PT Long Term Goals - 01/10/15 1408    PT LONG TERM GOAL #1   Title Pt will be independent with Parkinson's specific HEP for improved functional mobility and gait and for decreased pain.  TARGET 02/08/15   Time 4   Period Weeks   Status New   PT LONG TERM GOAL #2   Title Pt will improve Functional Gait Assessment to at least 22/30 for decreased fall risk/improved functional mobility.   Time 4   Period Weeks   Status New   PT LONG TERM GOAL #3   Title Pt will perform at least 8 of 10  reps of sit<>stand transfers from low surfaces (<18 inches) with no evidence of posterior lean for improved transfer efficiency and safety.   Time 4   Period Weeks   Status New   PT LONG TERM GOAL #4   Title Pt will verbalize/demonstrate at least 3 means to decrease festinating with gait.   Time 4   Period Weeks   Status New   PT LONG TERM GOAL #5   Title Pt will verbalize understanding of local Parkinson's disease related resources.   Time 4   Period Weeks   Additional Long Term Goals   Additional Long Term Goals Yes   PT LONG TERM GOAL #6   Title Pt will demonstrate improved balance by taking 2 or less steps in push and release test to lateral and posterior directions.   Time 4   Period Weeks   Status New               Plan - 01/22/15 0904    Clinical Impression Statement Pt continues with forward head/neck/trunk posture and decreased step length and arm swing.  Responded well to cues and gait training.  Discussed importance of walking program (vs just walking around office at work to get things).  Continue PT per POC.   Pt will benefit from skilled therapeutic intervention in order to improve on the following deficits Abnormal gait;Decreased coordination;Decreased balance;Difficulty walking;Postural dysfunction;Pain   Rehab Potential Good   PT Frequency 2x / week   PT Duration 4 weeks  plus eval   PT Treatment/Interventions ADLs/Self Care Home Management;Therapeutic exercise;Therapeutic activities;Functional mobility training;Gait training;Balance training;Neuromuscular re-education;Patient/family education   PT Next Visit Plan Exercises for posture (door frame stretch).  Try PWR! prone.  Provide handout and review tips to reduce freezing, provide handout for optimal community fitness, weightshifting with gait stop/starts   Consulted and Agree with Plan of Care Patient        Problem List Patient Active Problem List   Diagnosis Date Noted  . Parkinson disease [facility])      Narda Bonds 01/22/2015, 9:19 AM  [facility] [address] [place], [name], [address] Phone: [phone]   Fax:  [phone]  Name: Ronald Noble MRN: [id number] Date of Birth: 1946/02/16    Narda Bonds, [place] 01/22/2015 9:19 AM Phone: [phone] Fax: [phone]

## 2015-01-23 ENCOUNTER — Ambulatory Visit: Payer: Medicare Other | Admitting: Physical Therapy

## 2015-01-23 DIAGNOSIS — R269 Unspecified abnormalities of gait and mobility: Secondary | ICD-10-CM

## 2015-01-23 DIAGNOSIS — R293 Abnormal posture: Secondary | ICD-10-CM | POA: Diagnosis not present

## 2015-01-23 DIAGNOSIS — R2689 Other abnormalities of gait and mobility: Secondary | ICD-10-CM | POA: Diagnosis not present

## 2015-01-24 ENCOUNTER — Ambulatory Visit: Payer: Medicare Other | Admitting: Physical Therapy

## 2015-01-24 DIAGNOSIS — G4733 Obstructive sleep apnea (adult) (pediatric): Secondary | ICD-10-CM | POA: Diagnosis not present

## 2015-01-24 DIAGNOSIS — Z1389 Encounter for screening for other disorder: Secondary | ICD-10-CM | POA: Diagnosis not present

## 2015-01-24 DIAGNOSIS — H9190 Unspecified hearing loss, unspecified ear: Secondary | ICD-10-CM | POA: Diagnosis not present

## 2015-01-24 DIAGNOSIS — Z832 Family history of diseases of the blood and blood-forming organs and certain disorders involving the immune mechanism: Secondary | ICD-10-CM | POA: Diagnosis not present

## 2015-01-24 DIAGNOSIS — E785 Hyperlipidemia, unspecified: Secondary | ICD-10-CM | POA: Diagnosis not present

## 2015-01-24 DIAGNOSIS — A6920 Lyme disease, unspecified: Secondary | ICD-10-CM | POA: Diagnosis not present

## 2015-01-24 DIAGNOSIS — N401 Enlarged prostate with lower urinary tract symptoms: Secondary | ICD-10-CM | POA: Diagnosis not present

## 2015-01-24 DIAGNOSIS — Z0000 Encounter for general adult medical examination without abnormal findings: Secondary | ICD-10-CM | POA: Diagnosis not present

## 2015-01-24 DIAGNOSIS — N529 Male erectile dysfunction, unspecified: Secondary | ICD-10-CM | POA: Diagnosis not present

## 2015-01-24 NOTE — Therapy (Signed)
[facility] [address] [place], [name], [address] Phone: [phone]   Fax:  [phone]  Physical Therapy Treatment  Patient Details  Name: Kallin Henk MRN: [id number] Date of Birth: 28-Jul-1945 No Data Recorded  Encounter Date: 01/23/2015      PT End of Session - 01/24/15 0903    Visit Number 5   Number of Visits 9   Date for PT Re-Evaluation 03/10/15   Authorization Type Medicare; BCBS secondary-G-code every 10th visit   PT Start Time 0803   PT Stop Time 0846   PT Time Calculation (min) 43 min   Equipment Utilized During Treatment Gait belt   Activity Tolerance Patient tolerated treatment well   Behavior During Therapy [facility] for tasks assessed/performed      Past Medical History  Diagnosis Date  . Parkinson disease   . Sleep apnea     mild  . BPH (benign prostatic hyperplasia)   . Skin cancer of face     arms and back  . Spider bite     brown recluse/ left ankle/08/13    Past Surgical History  Procedure Laterality Date  . Transurethral resection of prostate      2008  . Knee surgery      exploratory/left  . Colonoscopy    . Skin cancer excision  01/2014    face  . Burr hole w/ stereotactic insertion of dbs leads / intraop microelectrode recording      There were no vitals filed for this visit.  Visit Diagnosis:  Posture abnormality  Abnormality of gait      Subjective Assessment - 01/23/15 0806    Subjective No changes since last visit.     Currently in Pain? Other (Comment)  just has some stiffness in shoulders-no pain                         [facility] Adult PT Treatment/Exercise - 01/24/15 0001    Ambulation/Gait   Ambulation/Gait Yes   Ambulation/Gait Assistance 5: Supervision   Ambulation/Gait Assistance Details Gait on treadmill, 2 x 3 minutes at 1.2>1.8 mph with bilateral UE support.  Kicking ball on treadmill at 0.8 mph, with cues to improve stance  time, step length, foot clearance, and heelstrike with gait.  Treadmill gait followed by over-ground gait focusing on the above, with initial cues for reciprocal arm swing.   Ambulation Distance (Feet) 500 Feet  200 ft, then treadmill x 6 minutes   Assistive device None  treadmill with UE support also   Gait Pattern Step-through pattern  Improved foot clearance and arm swing with cueing   Ambulation Surface Level;Indoor   Gait Comments Gait with start/stops with cues for stagger stance, for improved weightshifting to initiate gait (as strategy to decrease episodes of festinating)   Neuro Re-ed    Neuro Re-ed Details  Weight shifting at counter of forward, backward and lateral weight shifting incorporating with coordinated UE movements.  Needs cues for intensity and posture and weight shift           PWR [facility]) - 01/24/15 0902    PWR! Up 20   PWR! Rock 20   PWR! Twist 20   PWR Step 20   Comments Forward step and reach x 20, then back step and reach x 20 reps; Cues for intensity of movement and weightshifting through hips for improved balance strategy.  PT Long Term Goals - 01/10/15 1408    PT LONG TERM GOAL #1   Title Pt will be independent with Parkinson's specific HEP for improved functional mobility and gait and for decreased pain.  TARGET 02/08/15   Time 4   Period Weeks   Status New   PT LONG TERM GOAL #2   Title Pt will improve Functional Gait Assessment to at least 22/30 for decreased fall risk/improved functional mobility.   Time 4   Period Weeks   Status New   PT LONG TERM GOAL #3   Title Pt will perform at least 8 of 10 reps of sit<>stand transfers from low surfaces (<18 inches) with no evidence of posterior lean for improved transfer efficiency and safety.   Time 4   Period Weeks   Status New   PT LONG TERM GOAL #4   Title Pt will verbalize/demonstrate at least 3 means to decrease festinating with gait.   Time 4   Period Weeks   Status  New   PT LONG TERM GOAL #5   Title Pt will verbalize understanding of local Parkinson's disease related resources.   Time 4   Period Weeks   Additional Long Term Goals   Additional Long Term Goals Yes   PT LONG TERM GOAL #6   Title Pt will demonstrate improved balance by taking 2 or less steps in push and release test to lateral and posterior directions.   Time 4   Period Weeks   Status New               Plan - 01/24/15 6568    Clinical Impression Statement Pt responds well to cueing with gait and balance training.  Tactile cues provided at hips as means for patient to improve weigthshifting and initiation of movement.  Pt will continue to benefit from further skilled PT to address balance and gait    Pt will benefit from skilled therapeutic intervention in order to improve on the following deficits Abnormal gait;Decreased coordination;Decreased balance;Difficulty walking;Postural dysfunction;Pain   Rehab Potential Good   PT Frequency 2x / week   PT Duration 4 weeks  plus eval   PT Treatment/Interventions ADLs/Self Care Home Management;Therapeutic exercise;Therapeutic activities;Functional mobility training;Gait training;Balance training;Neuromuscular re-education;Patient/family education   PT Next Visit Plan Try PWR! Prone; Provide handout/review tips to reduce freezing, provide handout for optimal community fitness   Consulted and Agree with Plan of Care Patient        Problem List Patient Active Problem List   Diagnosis Date Noted  . Parkinson disease ([place])     [name],[name] 01/24/2015, 9:06 AM Frazier Butt., PT [facility] [address] [place], [name], [address] Phone: [phone]   Fax:  [phone]  Name: Reis Goga MRN: [id number] Date of Birth: Dec 10, 1945

## 2015-01-29 ENCOUNTER — Ambulatory Visit: Payer: Medicare Other

## 2015-01-29 DIAGNOSIS — R293 Abnormal posture: Secondary | ICD-10-CM | POA: Diagnosis not present

## 2015-01-29 DIAGNOSIS — R269 Unspecified abnormalities of gait and mobility: Secondary | ICD-10-CM

## 2015-01-29 DIAGNOSIS — R2689 Other abnormalities of gait and mobility: Secondary | ICD-10-CM | POA: Diagnosis not present

## 2015-01-29 NOTE — Therapy (Signed)
[facility] [address] [place], [name], [address] Phone: [phone]   Fax:  [phone]  Physical Therapy Treatment  Patient Details  Name: Ronald Noble MRN: [id number] Date of Birth: [birth year] No Data Recorded  Encounter Date: 01/29/2015      PT End of Session - 01/29/15 1332    Visit Number 6   Number of Visits 9   Date for PT Re-Evaluation 03/10/15   Authorization Type Medicare; BCBS secondary-G-code every 10th visit   PT Start Time 0804   PT Stop Time 0844   PT Time Calculation (min) 40 min   Equipment Utilized During Treatment Gait belt   Activity Tolerance Patient tolerated treatment well   Behavior During Therapy [facility] for tasks assessed/performed      Past Medical History  Diagnosis Date  . Parkinson disease ([place])   . Sleep apnea     mild  . BPH (benign prostatic hyperplasia)   . Skin cancer of face     arms and back  . Spider bite     brown recluse/ left ankle/08/13    Past Surgical History  Procedure Laterality Date  . Transurethral resection of prostate      2008  . Knee surgery      exploratory/left  . Colonoscopy    . Skin cancer excision  01/2014    face  . Burr hole w/ stereotactic insertion of dbs leads / intraop microelectrode recording      There were no vitals filed for this visit.  Visit Diagnosis:  Abnormality of gait  Posture abnormality      Subjective Assessment - 01/29/15 0807    Subjective Pt reported he picked up a bushel of apples over the weekend and it hurt his back. Pt reported he has not performed HEP due to lifting apples. Pt denied back pain this morning.    Pertinent History DBS Feb-March 2016   Patient Stated Goals Pt's goal for therapy is to improve foot and leg movement, walking, and improve posture.   Currently in Pain? No/denies        Therex: Pt performed seated B scapula retraction x10 without band and 2x10 with yellow band. Cues  and demonstration for technique.  Neuro re-ed: Pt reviewed and performed prone PWR! HEP with cues for technique x20 reps.                [facility] Adult PT Treatment/Exercise - 01/29/15 0831    Ambulation/Gait   Ambulation/Gait Yes   Ambulation/Gait Assistance 5: Supervision   Ambulation/Gait Assistance Details Cues to improve trunk rotation, heel strike, and upright posture.   Ambulation Distance (Feet) 445 Feet  and 230'   Assistive device None   Gait Pattern Step-through pattern;Decreased dorsiflexion - right;Decreased dorsiflexion - left;Decreased stride length;Decreased trunk rotation   Ambulation Surface Level;Indoor           Self Care:     PT Education - 01/29/15 1331    Education provided Yes   Education Details Reviewed PWR! prone HEP and provided pt with B scapula retraction with yellow theraband, pt declined print out stating "I'll remember". howver, pt might benefit from hand out next session. PT provided pt with community fitness handout, and reviewed with pt. PT also taught pt proper log rolling to perform bed transfers to decr. Strain on low back.   Person(s) Educated Patient   Methods Explanation;Demonstration;Tactile cues;Verbal cues;Handout   Comprehension Returned demonstration;Verbalized understanding;Need further instruction  PT Long Term Goals - 01/10/15 1408    PT LONG TERM GOAL #1   Title Pt will be independent with Parkinson's specific HEP for improved functional mobility and gait and for decreased pain.  TARGET 02/08/15   Time 4   Period Weeks   Status New   PT LONG TERM GOAL #2   Title Pt will improve Functional Gait Assessment to at least 22/30 for decreased fall risk/improved functional mobility.   Time 4   Period Weeks   Status New   PT LONG TERM GOAL #3   Title Pt will perform at least 8 of 10 reps of sit<>stand transfers from low surfaces (<18 inches) with no evidence of posterior lean for improved transfer  efficiency and safety.   Time 4   Period Weeks   Status New   PT LONG TERM GOAL #4   Title Pt will verbalize/demonstrate at least 3 means to decrease festinating with gait.   Time 4   Period Weeks   Status New   PT LONG TERM GOAL #5   Title Pt will verbalize understanding of local Parkinson's disease related resources.   Time 4   Period Weeks   Additional Long Term Goals   Additional Long Term Goals Yes   PT LONG TERM GOAL #6   Title Pt will demonstrate improved balance by taking 2 or less steps in push and release test to lateral and posterior directions.   Time 4   Period Weeks   Status New               Plan - 01/29/15 1332    Clinical Impression Statement Pt demonstrated progress, as he did not experience festination or freezing episodes during gait training. Pt demonstrated improve B arm swing, however, pt continues to exhibit decr. trunk/hip rotation during gait. Pt would continue to benefit from skilled PT to improve sfaety during functional mobility.   Pt will benefit from skilled therapeutic intervention in order to improve on the following deficits Abnormal gait;Decreased coordination;Decreased balance;Difficulty walking;Postural dysfunction;Pain   Rehab Potential Good   PT Frequency 2x / week   PT Duration 4 weeks   PT Treatment/Interventions ADLs/Self Care Home Management;Therapeutic exercise;Therapeutic activities;Functional mobility training;Gait training;Balance training;Neuromuscular re-education;Patient/family education   PT Next Visit Plan Provide handout/review tips to reduce freezing, provide pt with hip stretches (ER, hip flexor) to improve flexibilty during gait. Review B scap retraction HEP prn.   Consulted and Agree with Plan of Care Patient        Problem List Patient Active Problem List   Diagnosis Date Noted  . Parkinson disease ([place])     [place] Marian L 01/29/2015, 1:37 PM  [facility] [address] [place], [name], [address] Phone: [phone]   Fax:  [phone]  Name: Ronald Noble MRN: [id number] Date of Birth: 08/12/1945

## 2015-01-29 NOTE — Patient Instructions (Addendum)
Optimal Fitness Program after Therapy for People with Parkinson's Disease  1)  Therapy Home Exercise Program  -Do these Exercises DAILY as instructed by your therapist  -Big, deliberate effort with exercises  -These exercises are important to perform consistently, even when therapist has  finished, because these therapy exercises often address your specific  Parkinson's difficulties   2)  Walking  -  Work up to walking 3-5 times per week, 20-30 minutes per day  -This can be done at home, driveway, quiet street or an indoor track  -Focus should be on your Best posture, arm swing, step length for your best  walking pattern  3)  Aerobic Exercise  -Work up to 3-5 times per week, 30 minutes per day  -This can be stationary bike, seated stepper machine, elliptical machine  -Work up to 7-8/10 intensity during the exercise, at minimal to moderate     Resistance

## 2015-01-31 ENCOUNTER — Ambulatory Visit: Payer: Medicare Other | Admitting: Physical Therapy

## 2015-01-31 DIAGNOSIS — L570 Actinic keratosis: Secondary | ICD-10-CM | POA: Diagnosis not present

## 2015-01-31 DIAGNOSIS — D2262 Melanocytic nevi of left upper limb, including shoulder: Secondary | ICD-10-CM | POA: Diagnosis not present

## 2015-01-31 DIAGNOSIS — R293 Abnormal posture: Secondary | ICD-10-CM

## 2015-01-31 DIAGNOSIS — L565 Disseminated superficial actinic porokeratosis (DSAP): Secondary | ICD-10-CM | POA: Diagnosis not present

## 2015-01-31 DIAGNOSIS — L814 Other melanin hyperpigmentation: Secondary | ICD-10-CM | POA: Diagnosis not present

## 2015-01-31 DIAGNOSIS — R2689 Other abnormalities of gait and mobility: Secondary | ICD-10-CM | POA: Diagnosis not present

## 2015-01-31 DIAGNOSIS — Z85828 Personal history of other malignant neoplasm of skin: Secondary | ICD-10-CM | POA: Diagnosis not present

## 2015-01-31 DIAGNOSIS — R269 Unspecified abnormalities of gait and mobility: Secondary | ICD-10-CM

## 2015-01-31 DIAGNOSIS — D225 Melanocytic nevi of trunk: Secondary | ICD-10-CM | POA: Diagnosis not present

## 2015-01-31 DIAGNOSIS — D1801 Hemangioma of skin and subcutaneous tissue: Secondary | ICD-10-CM | POA: Diagnosis not present

## 2015-01-31 DIAGNOSIS — L821 Other seborrheic keratosis: Secondary | ICD-10-CM | POA: Diagnosis not present

## 2015-01-31 NOTE — Therapy (Signed)
[facility] [address] [place], [name], [address] Phone: [phone]   Fax:  [phone]  Physical Therapy Treatment  Patient Details  Name: Ronald Noble MRN: [id number] Date of Birth: Nov 30, 1945 No Data Recorded  Encounter Date: 01/31/2015      PT End of Session - 01/31/15 1431    Visit Number 7   Number of Visits 9   Date for PT Re-Evaluation 03/10/15   Authorization Type Medicare; BCBS secondary-G-code every 10th visit   PT Start Time 0807   PT Stop Time 0848   PT Time Calculation (min) 41 min   Activity Tolerance Patient tolerated treatment well   Behavior During Therapy [facility] for tasks assessed/performed      Past Medical History  Diagnosis Date  . Parkinson disease ([place])   . Sleep apnea     mild  . BPH (benign prostatic hyperplasia)   . Skin cancer of face     arms and back  . Spider bite     brown recluse/ left ankle/08/13    Past Surgical History  Procedure Laterality Date  . Transurethral resection of prostate      2008  . Knee surgery      exploratory/left  . Colonoscopy    . Skin cancer excision  01/2014    face  . Burr hole w/ stereotactic insertion of dbs leads / intraop microelectrode recording      There were no vitals filed for this visit.  Visit Diagnosis:  Abnormality of gait  Posture abnormality      Subjective Assessment - 01/31/15 0813    Subjective Pt reports he has not done the scapular retraction exercises but did do PWR! prone   Pertinent History DBS Feb-March 2016   Patient Stated Goals Pt's goal for therapy is to improve foot and leg movement, walking, and improve posture.   Currently in Pain? No/denies      Performed stretching program as written in patient instructions.  See patient instructions for details.  Prone PWR! With cues for technique and intensity.         PT Education - 01/31/15 1430    Education provided Yes   Education Details  HEP   Person(s) Educated Patient   Methods Explanation;Demonstration;Tactile cues;Handout   Comprehension Verbalized understanding;Other (comment)  needs reinforcement for intensity             PT Long Term Goals - 01/10/15 1408    PT LONG TERM GOAL #1   Title Pt will be independent with Parkinson's specific HEP for improved functional mobility and gait and for decreased pain.  TARGET 02/08/15   Time 4   Period Weeks   Status New   PT LONG TERM GOAL #2   Title Pt will improve Functional Gait Assessment to at least 22/30 for decreased fall risk/improved functional mobility.   Time 4   Period Weeks   Status New   PT LONG TERM GOAL #3   Title Pt will perform at least 8 of 10 reps of sit<>stand transfers from low surfaces (<18 inches) with no evidence of posterior lean for improved transfer efficiency and safety.   Time 4   Period Weeks   Status New   PT LONG TERM GOAL #4   Title Pt will verbalize/demonstrate at least 3 means to decrease festinating with gait.   Time 4   Period Weeks   Status New   PT LONG TERM GOAL #5   Title Pt  will verbalize understanding of local Parkinson's disease related resources.   Time 4   Period Weeks   Additional Long Term Goals   Additional Long Term Goals Yes   PT LONG TERM GOAL #6   Title Pt will demonstrate improved balance by taking 2 or less steps in push and release test to lateral and posterior directions.   Time 4   Period Weeks   Status New               Plan - 01/31/15 1431    Clinical Impression Statement Pt continues to need cues for intensity and encouragement to perform HEP as provided.  Continue PT per POC.   Pt will benefit from skilled therapeutic intervention in order to improve on the following deficits Abnormal gait;Decreased coordination;Decreased balance;Difficulty walking;Postural dysfunction;Pain   Rehab Potential Good   PT Frequency 2x / week   PT Duration 4 weeks   PT Treatment/Interventions ADLs/Self Care  Home Management;Therapeutic exercise;Therapeutic activities;Functional mobility training;Gait training;Balance training;Neuromuscular re-education;Patient/family education   PT Next Visit Plan Provide handout/review tips to reduce freezing, provide info on PD resources, low surface transfers   Consulted and Agree with Plan of Care Patient        Problem List Patient Active Problem List   Diagnosis Date Noted  . Parkinson disease [facility])     Narda Bonds 01/31/2015, 2:36 PM  [facility] [address] [place], [name], [address] Phone: [phone]   Fax:  [phone]  Name: Ronald Noble MRN: [id number] Date of Birth: 07-12-1945

## 2015-01-31 NOTE — Patient Instructions (Signed)
Hip Flexor Stretch    Lying on back near edge of bed, bend one leg, foot flat. Hang other leg over edge, relaxed, thigh resting entirely on bed for 60 seconds. Repeat 3 times on each side. Do 3 sessions per day. Advanced Exercise: Bend knee back keeping thigh in contact with bed.  http://gt2.exer.us/346   Copyright  VHI. All rights reserved.   Outer Hip Stretch: Reclined IT Band Stretch (Strap)    Strap around opposite foot, pull across only as far as possible with shoulders on mat. Hold for 30 seconds. Repeat 3 times each leg.  Copyright  VHI. All rights reserved.   Internal Hip Rotation (Prone)    Abdomen supported, bend left knee and slowly lower leg out until other hip starts to rise. Keep stomach tight.  Hold for 30-60 seconds and repeat 3 times.  Do 3 sessions per day.  Copyright  VHI. All rights reserved.   Calf / Gastoc: Runners' Stretch I    One leg back and straight, other forward and bent supporting weight, lean forward, gently stretching calf of back leg. Hold 30 seconds. Repeat with other leg. Repeat 3 times. Do 3 sessions per day.  Copyright  VHI. All rights reserved.

## 2015-02-05 ENCOUNTER — Ambulatory Visit: Payer: Medicare Other | Attending: Nurse Practitioner | Admitting: Physical Therapy

## 2015-02-05 DIAGNOSIS — R293 Abnormal posture: Secondary | ICD-10-CM | POA: Insufficient documentation

## 2015-02-05 DIAGNOSIS — R2689 Other abnormalities of gait and mobility: Secondary | ICD-10-CM | POA: Diagnosis not present

## 2015-02-05 DIAGNOSIS — R269 Unspecified abnormalities of gait and mobility: Secondary | ICD-10-CM

## 2015-02-05 NOTE — Therapy (Signed)
[facility] [address] [place], [name], [address] Phone: [phone]   Fax:  [phone]  Physical Therapy Treatment  Patient Details  Name: Ronald Noble MRN: [id number] Date of Birth: [birth year] No Data Recorded  Encounter Date: 02/05/2015      PT End of Session - 02/05/15 1302    Visit Number 8   Number of Visits 9   Date for PT Re-Evaluation 03/10/15   Authorization Type Medicare; BCBS secondary-G-code every 10th visit   PT Start Time 0806   PT Stop Time 0846   PT Time Calculation (min) 40 min   Activity Tolerance Patient tolerated treatment well   Behavior During Therapy [facility] for tasks assessed/performed      Past Medical History  Diagnosis Date  . Parkinson disease ([place])   . Sleep apnea     mild  . BPH (benign prostatic hyperplasia)   . Skin cancer of face     arms and back  . Spider bite     brown recluse/ left ankle/08/13    Past Surgical History  Procedure Laterality Date  . Transurethral resection of prostate      2008  . Knee surgery      exploratory/left  . Colonoscopy    . Skin cancer excision  01/2014    face  . Burr hole w/ stereotactic insertion of dbs leads / intraop microelectrode recording      There were no vitals filed for this visit.  Visit Diagnosis:  Abnormality of gait      Subjective Assessment - 02/05/15 0810    Subjective Denies falls or changes.  Did yoga this morning.   Pertinent History DBS Feb-March 2016   Patient Stated Goals Pt's goal for therapy is to improve foot and leg movement, walking, and improve posture.   Currently in Pain? No/denies                         [facility] Adult PT Treatment/Exercise - 02/05/15 0001    Transfers   Transfers Sit to Stand;Stand to Sit   Sit to Stand 7: Independent   Stand to Sit 7: Independent   Comments practiced from 18" chair and from 17" simulated couch without UE assist           PWR  [facility]) - 02/05/15 1259    PWR! exercises Moves in [place];Moves in supine   PWR! Up 20   PWR! Rock 20   PWR! Twist 20   PWR! Step 20   Comments quadruped;cues for intensity   PWR! Up 20   PWR! Rock 20   PWR! Twist 20   PWR! Step 20   Comments supine-cues for intensiyt             PT Education - 02/05/15 1301    Education provided Yes   Education Details supine and quadruped PWR!, POP, PWR! Moves class   Person(s) Educated Patient   Methods Explanation;Demonstration;Verbal cues;Handout   Comprehension Verbalized understanding;Returned demonstration;Other (comment)  needs reinforcement             PT Long Term Goals - 01/10/15 1408    PT LONG TERM GOAL #1   Title Pt will be independent with Parkinson's specific HEP for improved functional mobility and gait and for decreased pain.  TARGET 02/08/15   Time 4   Period Weeks   Status New   PT LONG TERM GOAL #2   Title Pt will  improve Functional Gait Assessment to at least 22/30 for decreased fall risk/improved functional mobility.   Time 4   Period Weeks   Status New   PT LONG TERM GOAL #3   Title Pt will perform at least 8 of 10 reps of sit<>stand transfers from low surfaces (<18 inches) with no evidence of posterior lean for improved transfer efficiency and safety.   Time 4   Period Weeks   Status New   PT LONG TERM GOAL #4   Title Pt will verbalize/demonstrate at least 3 means to decrease festinating with gait.   Time 4   Period Weeks   Status New   PT LONG TERM GOAL #5   Title Pt will verbalize understanding of local Parkinson's disease related resources.   Time 4   Period Weeks   Additional Long Term Goals   Additional Long Term Goals Yes   PT LONG TERM GOAL #6   Title Pt will demonstrate improved balance by taking 2 or less steps in push and release test to lateral and posterior directions.   Time 4   Period Weeks   Status New               Plan - 02/05/15 1305    Clinical Impression  Statement Pt continues to need cues for intensity.  Plan to check goals next session and d/c per POC per Mady Haagensen, PT.   Pt will benefit from skilled therapeutic intervention in order to improve on the following deficits Abnormal gait;Decreased coordination;Decreased balance;Difficulty walking;Postural dysfunction;Pain   Rehab Potential Good   PT Frequency 2x / week   PT Duration 4 weeks   PT Treatment/Interventions ADLs/Self Care Home Management;Therapeutic exercise;Therapeutic activities;Functional mobility training;Gait training;Balance training;Neuromuscular re-education;Patient/family education   PT Next Visit Plan Provide handout/review tips to reduce freezing, check goals and d/c.   Consulted and Agree with Plan of Care Patient        Problem List Patient Active Problem List   Diagnosis Date Noted  . Parkinson disease [facility])     Narda Bonds 02/05/2015, 1:12 PM  [facility] [address] [place], [name], [address] Phone: [phone]   Fax:  [phone]  Name: Ronald Noble MRN: [id number] Date of Birth: March 11, 1946    Narda Bonds, [place] 02/05/2015 1:12 PM Phone: [phone] Fax: [phone]

## 2015-02-05 NOTE — Patient Instructions (Signed)
Proved Supine and Quadruped PWR! Exercises as HEP

## 2015-02-07 ENCOUNTER — Ambulatory Visit: Payer: Medicare Other | Admitting: Physical Therapy

## 2015-02-07 DIAGNOSIS — Z1212 Encounter for screening for malignant neoplasm of rectum: Secondary | ICD-10-CM | POA: Diagnosis not present

## 2015-02-07 DIAGNOSIS — R269 Unspecified abnormalities of gait and mobility: Secondary | ICD-10-CM | POA: Diagnosis not present

## 2015-02-07 DIAGNOSIS — R2689 Other abnormalities of gait and mobility: Secondary | ICD-10-CM | POA: Diagnosis not present

## 2015-02-07 NOTE — Therapy (Signed)
[facility] [address] [place], [name], [address] Phone: [phone]   Fax:  [phone]  Physical Therapy Treatment  Patient Details  Name: Ronald Noble MRN: [id number] Date of Birth: [birth year] No Data Recorded  Encounter Date: 02/07/2015      PT End of Session - 02/07/15 1218    Visit Number 9   Number of Visits 9   Date for PT Re-Evaluation 03/10/15   Authorization Type Medicare; BCBS secondary-G-code every 10th visit   PT Start Time 0807   PT Stop Time 0839   PT Time Calculation (min) 32 min   Activity Tolerance Patient tolerated treatment well   Behavior During Therapy [facility] for tasks assessed/performed      Past Medical History  Diagnosis Date  . Parkinson disease ([place])   . Sleep apnea     mild  . BPH (benign prostatic hyperplasia)   . Skin cancer of face     arms and back  . Spider bite     brown recluse/ left ankle/08/13    Past Surgical History  Procedure Laterality Date  . Transurethral resection of prostate      2008  . Knee surgery      exploratory/left  . Colonoscopy    . Skin cancer excision  01/2014    face  . Burr hole w/ stereotactic insertion of dbs leads / intraop microelectrode recording      There were no vitals filed for this visit.  Visit Diagnosis:  Abnormality of gait      Subjective Assessment - 02/07/15 1210    Subjective Denies falls or changes. Feels ready for d/c.   Pertinent History DBS Feb-March 2016   Patient Stated Goals Pt's goal for therapy is to improve foot and leg movement, walking, and improve posture.   Currently in Pain? No/denies            [facility] PT Assessment - 02/07/15 0813    Functional Gait  Assessment   Gait assessed  Yes   Gait Level Surface Walks 20 ft in less than 5.5 sec, no assistive devices, good speed, no evidence for imbalance, normal gait pattern, deviates no more than 6 in outside of the 12 in walkway width.   Change  in Gait Speed Able to smoothly change walking speed without loss of balance or gait deviation. Deviate no more than 6 in outside of the 12 in walkway width.   Gait with Horizontal Head Turns Performs head turns smoothly with no change in gait. Deviates no more than 6 in outside 12 in walkway width   Gait with Vertical Head Turns Performs head turns with no change in gait. Deviates no more than 6 in outside 12 in walkway width.   Gait and Pivot Turn Pivot turns safely within 3 sec and stops quickly with no loss of balance.   Step Over Obstacle Is able to step over 2 stacked shoe boxes taped together (9 in total height) without changing gait speed. No evidence of imbalance.   Gait with Narrow Base of Support Is able to ambulate for 10 steps heel to toe with no staggering.   Gait with Eyes Closed Walks 20 ft, no assistive devices, good speed, no evidence of imbalance, normal gait pattern, deviates no more than 6 in outside 12 in walkway width. Ambulates 20 ft in less than 7 sec.   Ambulating Backwards Walks 20 ft, no assistive devices, good speed, no evidence for imbalance, normal gait  Steps Alternating feet, no rail.   Total Score 30                     [facility] Adult PT Treatment/Exercise - 02/15/2015 0813    Transfers   Transfers Sit to Stand;Stand to Sit   Sit to Stand 7: Independent   Five time sit to stand comments  8.50 seconds no posterior lean   Stand to Sit 7: Independent   Comments repeated x 10 from simulated 17" couch without posterior lean or UE assist   Ambulation/Gait   Ambulation/Gait Yes   Ambulation/Gait Assistance 7: Independent   Ambulation/Gait Assistance Details See FGA   Assistive device None   Gait Pattern Step-through pattern;Decreased dorsiflexion - right;Decreased dorsiflexion - left;Decreased stride length;Decreased trunk rotation   Ambulation Surface Level;Indoor   Gait velocity 7.75 seconds=4.23 ft/sec   Timed Up and Go Test   TUG Normal TUG;Cognitive  TUG   Normal TUG (seconds) 5.72   Cognitive TUG (seconds) 7.94   High Level Balance   High Level Balance Comments With posterior, anterior and lateral push test pt is able to recover with 1 step for all directions                PT Education - February 15, 2015 1216    Education provided Yes   Education Details Screen for PT/OT/ST in 6 months, goals met   Person(s) Educated Patient   Methods Explanation   Comprehension Verbalized understanding             PT Long Term Goals - Feb 15, 2015 May 27, 1215    PT LONG TERM GOAL #1   Title Pt will be independent with Parkinson's specific HEP for improved functional mobility and gait and for decreased pain.  TARGET 02/08/15   Time 4   Period Weeks   Status Achieved   PT LONG TERM GOAL #2   Title Pt will improve Functional Gait Assessment to at least 22/30 for decreased fall risk/improved functional mobility.   Time 4   Period Weeks   Status Achieved   PT LONG TERM GOAL #3   Title Pt will perform at least 8 of 10 reps of sit<>stand transfers from low surfaces (<18 inches) with no evidence of posterior lean for improved transfer efficiency and safety.   Time 4   Period Weeks   Status Achieved   PT LONG TERM GOAL #4   Title Pt will verbalize/demonstrate at least 3 means to decrease festinating with gait.   Time 4   Period Weeks   Status Achieved   PT LONG TERM GOAL #5   Title Pt will verbalize understanding of local Parkinson's disease related resources.   Time 4   Period Weeks   Status Achieved   PT LONG TERM GOAL #6   Title Pt will demonstrate improved balance by taking 2 or less steps in push and release test to lateral and posterior directions.   Time 4   Period Weeks   Status Achieved               Plan - 15-Feb-2015 1218    Clinical Impression Statement Pt met all goals for PT.  Pt feels ready for d/c. Discharge per Mady Haagensen, PT.   PT Next Visit Plan Discharge from PT   Consulted and Agree with Plan of Care Patient           G-Codes - Feb 15, 2015 05-26-17    Functional Assessment Tool Used 1 step in push and  release test posterior and lateral;Functional Gait Assessment 30/30      Problem List Patient Active Problem List   Diagnosis Date Noted  . Parkinson disease [facility])     Narda Bonds 02/07/2015, 12:21 PM  [facility] [address] [place], [name], [address] Phone: [phone]   Fax:  [phone]  Name: Ronald Noble MRN: [id number] Date of Birth: January 04, 1946    Narda Bonds, [place] 02/07/2015 12:21 PM Phone: [phone] Fax: [phone]

## 2015-03-08 ENCOUNTER — Encounter: Payer: Medicare Other | Admitting: Physical Therapy

## 2015-03-08 NOTE — Therapy (Signed)
[facility] [address] [place], [name], [address] Phone: [phone]   Fax:  [phone]  Patient Details  Name: Ronald Noble MRN: [id number] Date of Birth: 1945-08-31 Referring Provider:  No ref. provider found  Encounter Date: 03/08/2015  For visit February 08, 2015: Addendum: G Codes Functional Assessment Tool:   1 step in push and release test posterior and lateral; FGA 30/30 Functional Limitation:  Mobility: Walking and Moving Around Current /discharge Status  [phone]): CI Goal Status  ([id number]): CI  PHYSICAL THERAPY DISCHARGE SUMMARY  Visits from Start of Care: 9  Current functional level related to goals / functional outcomes:     PT Long Term Goals - 08-Feb-2015 1217    PT LONG TERM GOAL #1   Title Pt will be independent with Parkinson's specific HEP for improved functional mobility and gait and for decreased pain.  TARGET 02/08/15   Time 4   Period Weeks   Status Achieved   PT LONG TERM GOAL #2   Title Pt will improve Functional Gait Assessment to at least 22/30 for decreased fall risk/improved functional mobility.   Time 4   Period Weeks   Status Achieved   PT LONG TERM GOAL #3   Title Pt will perform at least 8 of 10 reps of sit<>stand transfers from low surfaces (<18 inches) with no evidence of posterior lean for improved transfer efficiency and safety.   Time 4   Period Weeks   Status Achieved   PT LONG TERM GOAL #4   Title Pt will verbalize/demonstrate at least 3 means to decrease festinating with gait.   Time 4   Period Weeks   Status Achieved   PT LONG TERM GOAL #5   Title Pt will verbalize understanding of local Parkinson's disease related resources.   Time 4   Period Weeks   Status Achieved   PT LONG TERM GOAL #6   Title Pt will demonstrate improved balance by taking 2 or less steps in push and release test to lateral and posterior directions.   Time 4   Period Weeks   Status Achieved     Pt has met 5 of 5 goals and has improved functional mobility and balance reactions.   Remaining deficits: Balance-improving, coordination of gait-improving   Education / Equipment: Educated in [employer], means to decrease festinating with gait, local PD resources.  Plan: Patient agrees to discharge.  Patient goals were met. Patient is being discharged due to meeting the stated rehab goals.  ?????      Joselynn Amoroso W. 03/08/2015, 8:58 AM Frazier Butt., PT  [facility] [address] [place], [name], [address] Phone: [phone]   Fax:  [phone]

## 2015-03-11 ENCOUNTER — Encounter: Payer: Self-pay | Admitting: Neurology

## 2015-03-11 ENCOUNTER — Ambulatory Visit (INDEPENDENT_AMBULATORY_CARE_PROVIDER_SITE_OTHER): Payer: Medicare Other | Admitting: Neurology

## 2015-03-11 VITALS — BP 126/68 | HR 60 | Resp 16 | Ht 71.0 in | Wt 167.0 lb

## 2015-03-11 DIAGNOSIS — R55 Syncope and collapse: Secondary | ICD-10-CM

## 2015-03-11 DIAGNOSIS — Z9689 Presence of other specified functional implants: Secondary | ICD-10-CM

## 2015-03-11 DIAGNOSIS — Z9181 History of falling: Secondary | ICD-10-CM | POA: Diagnosis not present

## 2015-03-11 DIAGNOSIS — G20 Parkinson's disease: Secondary | ICD-10-CM | POA: Diagnosis not present

## 2015-03-11 DIAGNOSIS — Z4542 Encounter for adjustment and management of neuropacemaker (brain) (peripheral nerve) (spinal cord): Secondary | ICD-10-CM | POA: Diagnosis not present

## 2015-03-11 NOTE — Patient Instructions (Addendum)
I think your Parkinson's disease has remained fairly stable, which is reassuring. Nevertheless, as you know, this disease does progress with time. It can affect your balance, your memory, your mood, your bowel and bladder function, your posture, balance and walking and your activities of daily living. However, there are good supportive treatments and symptomatic treatments available, so most patients have a change to a good quality life and life expectancy is not typically altered. Overall you are doing fairly well but I do want to suggest a few things today:  Remember to drink plenty of fluid at least 6 glasses (8 oz each), eat healthy meals and do not skip any meals. Try to eat protein with a every meal and eat a healthy snack such as fruit or nuts in between meals. Try to keep a regular sleep-wake schedule and try to exercise daily, particularly in the form of walking, 20-30 minutes a day, if you can.   Taking your medication on schedule is key.   Try to stay active physically and mentally. Engage in social activities in your community and with your family and try to keep up with current events by reading the newspaper or watching the news. Try to do word puzzles and you may like to do puzzles and brain games on the computer such as on [email or web address]   As far as your medications are concerned, I would like to suggest that you take your current medication with the following additional changes: no changes.   I would like to see you back in 6 months, sooner if we need to. Please call us with any interim questions, concerns, problems, updates or refill requests.  Our phone number is [phone]. We also have an after hours call service for urgent matters and there is a physician on-call for urgent questions, that cannot wait till the next work day. For any emergencies you know to call 911 or go to the nearest emergency room.   You can email me through my chart and also leave a phone message for Beverlee Nims,  my nurse.   PLEASE: NO CLIMBING ON LADDERS! NO OVERDOING PHYSICAL EXERCISE!  PLEASE DRINK MORE WATER.

## 2015-03-11 NOTE — Progress Notes (Signed)
Subjective:    Patient ID: Ronald Noble is a 69 y.o. male.  HPI     Interim history:   Ronald Noble is a 69 year old right-handed gentleman with an underlying medical history of skin cancer including melanoma, enlarged prostate status post TURP who presents for followup consultation of his R sided predominant Parkinson's disease, complicated by RBD and sleep disturbance as well as mild dyskinesias, now s/p bilateral DBS placement at [facility]. He is accompanied by his wife today. I last saw him on 11/07/2014, at which time he reported doing reasonably well. He had nasal congestion after his fall and injury. Of note, unfortunately, he had a syncopal spell on 10/29/2014 and hit his nose as he fell and sustained a nasal fracture. I reviewed the emergency room records. He was referred to ENT. His laceration was repaired with Dermabond. He reported that he stood up and felt lightheaded and then passed out. He fell and hit his face. He had a CT head without contrast as well as maxillofacial CT without contrast on 10/29/2014 which showed: RIGHT and LEFT nasal bone fractures. Small displaced fracture fragment on the LEFT. No skull fracture or intracranial hemorrhage. Unremarkable appearing deep brain stimulator leads. He fell at work. He had taken Levitra at night and 2 days prior he was working hard in the yard. He is notoriously not drinking enough water he admits. He had taken his Stalevo in the morning and was due for his next dose and became lightheaded when he got up quickly from his desk. He had been sitting for about 45 minutes consistently at his desk. He did have a warning sign and kept walking instead of sitting down again. He had a follow-up appointment at [facility] on 11/02/2014 and his Requip was increased slightly again. He had re-programming of his DBS as well. He had reduced the Requip XL to 3 mg twice daily but this was increased to 4 mg twice daily. His Stalevo was at 4-6 pills daily  depending on his work day. He does get sleepy at night around 9:30 and often falls asleep while watching TV. He tried Melatonin long-acting but in combination with clonazepam at night it did not work well and in fact he had insomnia. He tried to reduce his clonazepam to 0.25 mg each night but increased it back to 0.5 mg each night. For his nose fracture he saw Dr. Redmond Baseman and was scheduled for surgery. I did not change his medications. I asked him to drink more water and reminded him to change positions slowly.  Today, 03/11/2015: He reports doing well, but has had some interim changes: He fell again recently. This was around Labor Day. They had come back from a road trip and he was in the car for about 2-1/2 hours straight. When they arrived home and his wife was opening the door he had fallen on the ground on the pavement and chipped his tooth. They were thankfully with friends who helped her. Since then, he has a crown placed. He had some changes in his DBS settings since then and had a follow-up at [facility] today. Per wife, his orthostatic blood pressure values were stable. Since his settings were changed he has had no other syncopal spell. He had some medication changes since I last saw him. He is no longer on clonazepam. Azilect is currently at 1 mg once daily, Stalevo 100 mg 4 or 5 times a day, Requip XL 6 mg once daily. Melatonin CR 3 mg, 2 at  night. He is feeling okay, he has been sleeping better. He is advised to drink 4 bottles of water per Dr. Donna Christen NP. He also received a handout on orthostatic hypotension. He is scheduled for tilt table test next month and a follow-up in March. His wife reports that he has recently climbed on a ladder outside, trying to take off the basketball hoop from the garage. She feels that he is engaging in some risky behaviors and does not know how to explain to him that these behaviors are risky. He feels that he estimates the risk appropriately.  Previously:   I saw him on  08/07/2014, at which time he reported doing quite well after his DBS placement. His wife felt that he was doing great. He was eating well. He was not drinking enough water. He had trace edema in his distal lower extremities. He had 2 appointments for DBS programming. His Stalevo was reduced from 6 pills a day to 4 pills a day but then he increased it back to 5 pills a day. He was off generic Sinemet. He was on Azilect once daily and Requip long-acting 8 mg twice daily.   He's been back to work. He is currently on Azilect once daily, Requip XL 8 mg twice daily, Stalevo 100 mg strength one pill 5 times a day and he has stopped the carbidopa-levodopa. I reviewed his outside records from Dr. Salomon Fick at [facility] neurosurgery. Patient had bilateral STN DBS electrodes placed on 06/01/2014. He had pulse generators placed approximately 3 weeks after that, 06/13/14.  I saw him on 02/01/2014, at which time he reported difficulty staying asleep. He had not consistently tried melatonin. He had problems with urinary urgency and was advised to try Kegel exercises by his urologist. His PCP talked to an infectious diseases specialist and it was determined he did not actually have Lyme disease.   He did not show for an appointment on 01/30/2014 - he genuinely forgot!   I saw him on 12/19/2013, at which time he reported that his medication was not working as well. He reported more freezing episodes and more off time. Of note, he had been using a new protein milkshake that he was drinking first thing in the morning. He had lost some weight and in an effort to improve his weight loss he had started this new supplements. His primary care physician had referred him to a nutritionist. We also talked about DBS evaluation and he requested to see another specialist for DBS candidacy. I referred him to [name] at [facility] I also advised him to discontinue using the new protein supplement as I  felt it may be interfering with the absorption of his Stalevo. I also suggested a trial of Parcopa half a pill 3 times a day at 11, 2 PM and 7 PM and we kept the Stalevo the same. He was advised to use MiraLAX as needed for constipation.   I saw him on 07/31/13, at which time [name], including swimming, yoga, and weights. He has been on Stalevo 100 mg every 2 1/2 hours starting at 7:30 AM for 6 doses. He stopped clonazepam, as he felt some daytime sluggishness. He has been taking C/L 1/2 tid in between. He had no cognitive issues, but did have some "downtime" midday. I did not change his medications. He had an appointment with [name] at [facility] in June, but there was a  mix up with his appointment and he does not have an appointment until next year.   In the interim, he was diagnosed with Lyme d/s in July. He was tested and treated by his PCP has has been referred to an infectious d/s doctor. He brings in his latest lying test results from 12/05/2013 which I reviewed: It looks like his IgM antibody status is negative but his IgG antibody status has not quite turn positive as far as I understand the report. That means he is probably in the subacute or early chronic phase if I interpret this correctly. He requested a sooner than scheduled appointment, due to feeling more freezing and more off time. This starts in the late morning hours but can also go into the afternoon after lunch. Cognitively and mood wise he feels stable. He does not sleep well at night. He has not consistently tried melatonin I recommended.   I saw him on 01/30/2013, at which time I felt he was stable. We talked about DBS surgery. I did not make any medication changes. He is aware of the increase risk of melanoma associated with DAs and his dermatologist is aware that he is taking Requip XL.   I first met him on 09/27/2012, which time I did not make any changes to his medication and  did not order any new test as he was stable. He saw [name] at [facility] and discussed DBS in the past but was not deemed a cadidate for it at the time as he was doing well. He was told by [name] to stay on schedule with his medication. 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 Sleep has been an issue. He goes to sleep well, but wakes up after 45 minutes to 1 hour. He is seeing a urologist. He exercises regularly.   He previously followed with [name] and was last seen by him on 05/25/2012, at which time [name] felt he was stable, but because of off time at 2 hours he increased his carbidopa-levodopa by adding half of a 10-100 mg strength 3 times a day to his regimen. He recommended decreasing his Stalevo to 125 mg from 100 mg 6 times a day. He has been on Stalevo 125 mg 6 times a day, at 7:30, 10, 12:30, 3 PM, 5:30 PM and 8 PM. He is on clonazepam 0.5 mg at night, Requip long-acting 6 mg twice daily, rasagiline 1 mg once daily, carbidopa-levodopa 10-100 mg strength half a tablet 3 times a day, 7:30 to 10, then midday and later in the evening.   He was followed by [name] since 2008 with a history of Parkinson's disease. He has a history of increased tone and slowness as well as tremor with R sided predominance. He was initially placed on Requip XL and then rasagiline was added in October 2008. In February 2010 Stalevo was added. He had low vitamin D levels in 2008. ANA, ESR, ceruloplasmin were normal except for slightly low ceruloplasmin of 16. Urine copper test was negative. Methylmalonic acid was normal. Urine for heavy metals is normal as well. MRI brain with and without contrast on 01/07/2007 was normal. He had a sleep study in October 2008 showing PLMs and evidence of RBD. Repeat sleep study in September 2011 showed supine obstructive sleep apnea and RBD. He had a CPAP titration study in late September 2011 which was not very successful. He has been on clonazepam at night.  He has not noticed any compulsive behaviors, memory loss, depression, swelling, or orthostatic dizziness. He recently had a skin biopsy showing atypical cells, concerning for melanoma.   He works full-time as an Forensic psychologist, [employer]. He exercises regularly and does yoga.    His Past Medical History Is Significant For: Past Medical History  Diagnosis Date  . Parkinson disease ([place])   . Sleep apnea     mild  . BPH (benign prostatic hyperplasia)   . Skin cancer of face     arms and back  . Spider bite     brown recluse/ left ankle/08/13    His Past Surgical History Is Significant For: Past Surgical History  Procedure Laterality Date  . Transurethral resection of prostate      2008  . Knee surgery      exploratory/left  . Colonoscopy    . Skin cancer excision  01/2014    face  . Burr hole w/ stereotactic insertion of dbs leads / intraop microelectrode recording      His Family History Is Significant For: Family History  Problem Relation Age of Onset  . COPD Mother   . Cancer Father     His Social History Is Significant For: Social History   Social History  . Marital Status: Married    Spouse Name: Levada Dy  . Number of Children: 4  . Years of Education: 52   Occupational History  . LAWYER    Social History Main Topics  . Smoking status: Never Smoker   . Smokeless tobacco: Never Used  . Alcohol Use: 3.0 oz/week    3 Glasses of wine, 2 Cans of beer per week     Comment: occasional  . Drug Use: No  . Sexual Activity: Not Asked   Other Topics Concern  . None   Social History Narrative   Patient is right handed and resides with wife   1-2 cups of coffee a day     His Allergies Are:  No Known Allergies:   His Current Medications Are:  Outpatient Encounter Prescriptions as of 03/11/2015  Medication Sig  . carbidopa-levodopa-entacapone (STALEVO 100) 25-100-200 MG per tablet Take one tablet by mouth four to six times daily as directed  . desmopressin  (DDAVP) 0.2 MG tablet Take 0.6 mg by mouth at bedtime.  . Melatonin CR 3 MG TBCR Take 2 tablets by mouth at bedtime.  . rasagiline (AZILECT) 1 MG TABS tablet Take 1 tablet (1 mg total) by mouth daily.  Marland Kitchen rOPINIRole (REQUIP XL) 4 MG 24 hr tablet 6 mg.   . Ropinirole HCl 6 MG TB24   . vardenafil (LEVITRA) 20 MG tablet Take 20 mg by mouth daily as needed for erectile dysfunction.   . [DISCONTINUED] clonazePAM (KLONOPIN) 0.5 MG tablet Take 0.5 mg by mouth at bedtime as needed. sleep  . [DISCONTINUED] Melatonin-Pyridoxine (MELATONIN TR WITH VITAMIN B6) 3-10 MG TBCR Take 3 mg by mouth.   No facility-administered encounter medications on file as of 03/11/2015.  :  Review of Systems:  Out of a complete 14 point review of systems, all are reviewed and negative with the exception of these symptoms as listed below:   Review of Systems  Neurological:       No new concerns per patient.     Objective:  Neurologic Exam  Physical Exam Physical Examination:   Filed Vitals:   03/11/15 1604  BP: 126/68  Pulse: 60  Resp: 16   General Examination: The patient  is a very pleasant 69 y.o. male in no acute distress. He denies any lightheadedness upon standing.  HEENT: Normocephalic, atraumatic, pupils are equal, round and reactive to light and accommodation. Extraocular tracking shows mild saccadic breakdown without nystagmus noted. He has some scarring on his nose, but looks well healed. There is no limitation to his gaze. There is mild decrease in eye blink rate. Hearing is intact. Face is symmetric with mild facial masking and normal facial sensation. There is no lip, neck or jaw tremor. Neck is moderately rigid with intact passive ROM. There are no carotid bruits on auscultation. Oropharynx exam reveals mild mouth dryness. No significant airway crowding is noted. Mallampati is class II. Tongue protrudes centrally and palate elevates symmetrically. There is no drooling. There are mild generalized  dyskinesias. His stimulator wires are tunneled on the left side.   Chest: is clear to auscultation without wheezing, rhonchi or crackles noted. His generator site is not tender.   Heart: sounds are regular and normal without murmurs, rubs or gallops noted.   Abdomen: is soft, non-tender and non-distended with normal bowel sounds appreciated on auscultation.  Extremities: There is no trace edema in the distal lower extremities bilaterally.   Skin: is warm and dry with no trophic changes noted. Age-related changes are noted on the skin and scars from skin cancer removals.   Musculoskeletal: exam reveals no obvious joint deformities, tenderness, joint swelling or erythema.  Neurologically:  Mental status: The patient is awake and alert, paying good  attention. He is able to completely provide the history. He is oriented to: person, place, time/date, situation, day of week, month of year and year. His memory, attention, language and knowledge are intact. There is no aphasia, agnosia, apraxia or anomia. There is a no significant degree of bradyphrenia. Speech is mildly hypophonic with no dysarthria noted. Mood is congruent and affect is normal.   Cranial nerves are as described above under HEENT exam. In addition, shoulder shrug is normal with equal shoulder height noted.  Motor exam: Normal bulk, and strength for age is noted. There are no dyskinesias noted today.  Tone is mildly rigid with presence of cogwheeling in the right upper extremity. There is overall mild bradykinesia. There is no drift or rebound.  There is a no resting tremor today.   Romberg is negative.  Reflexes are 1+ in the upper extremities and 1+ in the lower extremities.   Fine motor skills exam:  Fine motor skills are mild to moderately impaired, stable. Cerebellar testing shows no dysmetria or intention tremor on finger to nose testing. Heel to shin is unremarkable bilaterally. There is no truncal or gait ataxia.    Sensory exam is intact to light touch throughout .   Gait, station and balance: He stands up from the seated position with no significant difficulty and does not need to push up with His hands. He needs no assistance. No veering to one side is noted. He is not noted to lean to the side. Posture is mildly stooped, advanced for age. Stance is narrow-based. He walks with fairly good pace and stride length and fairly good arm swing bilaterally today. He has slight difficulty turning. Balance is minimally impaired.     Assessment and Plan:   In summary, Ronald Noble is a very pleasant 69 year old male with a history of right-sided predominant Parkinson's disease, complicated by RBD, sleep disturbance, generalized dyskinesias, motor fluctuations, constipation and bladder hyperactivity, now s/p b/l DBS placement at [facility] on  06/01/14 and generator placement on 06/13/2014. Overall, he was doing well, but had another recent set back due to another syncope and collapse and chipped tooth. Since then, he had a crown placed.  Prior to that he had a syncopal event and fractured his nose as he fell on his face. Since then, he had no surgery. He had a follow-up at [facility] today and his orthostatic blood pressure values were stable. He is scheduled for a tilt table test next month and a follow-up in March. His DBS settings were changed and his medications were kept the same. his blood pressure looks good today. His exam actually looks good today. I suggested no medication changes. I asked him to refrain from climbing any ladders or be at [facility]. He is advised to stand up slowly and walk around every hour for a few minutes at a time. He is advised to drink enough water. His weight has stabilized as well. If his tilt table test is negative I suggest that he pursue cardiology consultation. He may benefit from a 30 day heart monitor as well. We will await test results. I suggested a 4 month follow-up, sooner if  needed. I answered all her questions today and the patient and his wife were in agreement. I spent 25 minutes in total face-to-face time with the patient, more than 50% of which was spent in counseling and coordination of care, reviewing test results, reviewing medication and discussing or reviewing the diagnosis of PD,  hypotension and syncope, the prognosis and treatment adjustments down the road.

## 2015-04-15 DIAGNOSIS — I951 Orthostatic hypotension: Secondary | ICD-10-CM | POA: Diagnosis not present

## 2015-04-15 DIAGNOSIS — Z9689 Presence of other specified functional implants: Secondary | ICD-10-CM | POA: Diagnosis not present

## 2015-04-15 DIAGNOSIS — R931 Abnormal findings on diagnostic imaging of heart and coronary circulation: Secondary | ICD-10-CM | POA: Diagnosis not present

## 2015-04-15 DIAGNOSIS — R42 Dizziness and giddiness: Secondary | ICD-10-CM | POA: Diagnosis not present

## 2015-04-15 DIAGNOSIS — Z79899 Other long term (current) drug therapy: Secondary | ICD-10-CM | POA: Diagnosis not present

## 2015-04-15 DIAGNOSIS — G20 Parkinson's disease: Secondary | ICD-10-CM | POA: Diagnosis not present

## 2015-05-08 DIAGNOSIS — Z0000 Encounter for general adult medical examination without abnormal findings: Secondary | ICD-10-CM | POA: Diagnosis not present

## 2015-05-08 DIAGNOSIS — R972 Elevated prostate specific antigen [PSA]: Secondary | ICD-10-CM | POA: Diagnosis not present

## 2015-05-08 DIAGNOSIS — N5201 Erectile dysfunction due to arterial insufficiency: Secondary | ICD-10-CM | POA: Diagnosis not present

## 2015-05-08 DIAGNOSIS — N320 Bladder-neck obstruction: Secondary | ICD-10-CM | POA: Diagnosis not present

## 2015-05-08 DIAGNOSIS — R351 Nocturia: Secondary | ICD-10-CM | POA: Diagnosis not present

## 2015-05-28 DIAGNOSIS — G20 Parkinson's disease: Secondary | ICD-10-CM | POA: Diagnosis not present

## 2015-05-28 DIAGNOSIS — G4733 Obstructive sleep apnea (adult) (pediatric): Secondary | ICD-10-CM | POA: Diagnosis not present

## 2015-05-28 DIAGNOSIS — I951 Orthostatic hypotension: Secondary | ICD-10-CM | POA: Diagnosis not present

## 2015-07-02 DIAGNOSIS — R030 Elevated blood-pressure reading, without diagnosis of hypertension: Secondary | ICD-10-CM | POA: Diagnosis not present

## 2015-07-09 ENCOUNTER — Encounter: Payer: Self-pay | Admitting: Neurology

## 2015-07-09 ENCOUNTER — Ambulatory Visit (INDEPENDENT_AMBULATORY_CARE_PROVIDER_SITE_OTHER): Payer: Medicare Other | Admitting: Neurology

## 2015-07-09 VITALS — BP 108/62 | HR 68 | Resp 16 | Ht 71.0 in | Wt 168.0 lb

## 2015-07-09 DIAGNOSIS — G20 Parkinson's disease: Secondary | ICD-10-CM

## 2015-07-09 DIAGNOSIS — Z9689 Presence of other specified functional implants: Secondary | ICD-10-CM | POA: Diagnosis not present

## 2015-07-09 DIAGNOSIS — R55 Syncope and collapse: Secondary | ICD-10-CM | POA: Diagnosis not present

## 2015-07-09 NOTE — Patient Instructions (Addendum)
Let's keep your meds the same.  For your muscle ache in the left upper back try gentle massage and a microwavable heat pad.  Keep up the good work, I can see you back in about 8 months, as you have follow up appointments at [facility] in the interim.

## 2015-07-09 NOTE — Progress Notes (Signed)
Subjective:    Patient ID: Ronald Noble is a 70 y.o. male.  HPI     Interim history:   Ronald Noble is a 70 year old right-handed gentleman with an underlying medical history of skin cancer including melanoma, enlarged prostate status post TURP who presents for followup consultation of his R sided predominant Parkinson's disease, complicated by RBD and sleep disturbance as well as mild dyskinesias, now s/p bilateral DBS placement at [facility]. He is accompanied by his wife today. I last saw him on 03/11/2015, at which time he reported a recent fall in September 2016. He had just come back from a road trip and after being in the car for about 2-1/2 hours he got out of the car and fell to the ground for his wife even realize what happened. He fell on pavement and chipped his tooth. They were with some friends who helped. He had to have a crown placed over his chipped tooth. His DBS settings were adjusted when he had his follow-up at [facility]. His orthostatic blood pressure values were stable. Since his settings were changed he had no other syncopal spell. He did have some medication changes as well. He was no longer on clonazepam. Stalevo was 100 mg strength 4-5 times a day. Requip long-acting was 6 mg once daily. She was on long-acting melatonin 6 mg each night. He was advised to drink 4 bottles of water. He was scheduled for tilt table test as well. His wife was concerned about him climbing on ladders. We talked about gait safety and syncope risk. He was advised not to climb on any ladders or work at heights.   Today, 07/09/2015: He reports doing okay, no recent syncope. He had a tilt table test on 04/15/2015 which showed abnormal findings with positive test for orthostatic hypotension but minimal symptoms reported at the time. He then saw Dr. Hartford Poli with vascular surgery for consultation on 05/28/2015. I reviewed the office note. The patient was advised to have a 24-hour blood pressure  monitor. He has finished this about a week ago and has an appointment tomorrow for follow-up with the vascular specialist. He also has an appointment in the movement disorders clinic with the nurse practitioner coming up and a follow-up appointment with [name] in October 2017. Overall, he feels he is doing well. Weight has stabilized her increased just a little bit. He did pull a muscle in his left upper back right by the shoulder blade recently. This has limited to some degree his exercise. He has used heat pad with some success for it. He continues to take Stalevo about 4 times a day, sometimes 5 times a day depending on his day to day activities. He continues to take long-acting Requip 6 mg once daily. He does not typically take the long-acting melatonin LA longer because he has been sleeping a little bit better. He has worked on Publishing rights manager and also pursued some cognitive behavioral therapy through online reading. He has occasional and mild constipation, usually at [place] when he exercises regularly. He did have a couple of occasions where he felt a little lightheaded like his blood pressure would drop. He was also advised by his vascular specialist to start using compression stockings, but he has not ventured out to doing that yet.  Previously:   I saw him on 11/07/2014, at which time he reported doing reasonably well. He had nasal congestion after his fall and injury. Of note, unfortunately, he had a syncopal spell on 10/29/2014  and hit his nose as he fell and sustained a nasal fracture. I reviewed the emergency room records. He was referred to ENT. His laceration was repaired with Dermabond. He reported that he stood up and felt lightheaded and then passed out. He fell and hit his face. He had a CT head without contrast as well as maxillofacial CT without contrast on 10/29/2014 which showed: RIGHT and LEFT nasal bone fractures. Small displaced fracture fragment on the LEFT. No skull fracture or  intracranial hemorrhage. Unremarkable appearing deep brain stimulator leads. He fell at work. He had taken Levitra at night and 2 days prior he was working hard in the yard. He is notoriously not drinking enough water he admits. He had taken his Stalevo in the morning and was due for his next dose and became lightheaded when he got up quickly from his desk. He had been sitting for about 45 minutes consistently at his desk. He did have a warning sign and kept walking instead of sitting down again. He had a follow-up appointment at [facility] on 11/02/2014 and his Requip was increased slightly again. He had re-programming of his DBS as well. He had reduced the Requip XL to 3 mg twice daily but this was increased to 4 mg twice daily. His Stalevo was at 4-6 pills daily depending on his work day. He does get sleepy at night around 9:30 and often falls asleep while watching TV. He tried Melatonin long-acting but in combination with clonazepam at night it did not work well and in fact he had insomnia. He tried to reduce his clonazepam to 0.25 mg each night but increased it back to 0.5 mg each night. For his nose fracture he saw Dr. Redmond Baseman and was scheduled for surgery. I did not change his medications. I asked him to drink more water and reminded him to change positions slowly.  I saw him on 08/07/2014, at which time he reported doing quite well after his DBS placement. His wife felt that he was doing great. He was eating well. He was not drinking enough water. He had trace edema in his distal lower extremities. He had 2 appointments for DBS programming. His Stalevo was reduced from 6 pills a day to 4 pills a day but then he increased it back to 5 pills a day. He was off generic Sinemet. He was on Azilect once daily and Requip long-acting 8 mg twice daily.   He's been back to work. He is currently on Azilect once daily, Requip XL 8 mg twice daily, Stalevo 100 mg strength one pill 5 times a day and he has stopped the  carbidopa-levodopa. I reviewed his outside records from Dr. Salomon Fick at [facility] neurosurgery. Patient had bilateral STN DBS electrodes placed on 06/01/2014. He had pulse generators placed approximately 3 weeks after that, 06/13/14.  I saw him on 02/01/2014, at which time he reported difficulty staying asleep. He had not consistently tried melatonin. He had problems with urinary urgency and was advised to try Kegel exercises by his urologist. His PCP talked to an infectious diseases specialist and it was determined he did not actually have Lyme disease.   He did not show for an appointment on 01/30/2014 - he genuinely forgot!   I saw him on 12/19/2013, at which time he reported that his medication was not working as well. He reported more freezing episodes and more off time. Of note, he had been using a new protein milkshake that he  was drinking first thing in the morning. He had lost some weight and in an effort to improve his weight loss he had started this new supplements. His primary care physician had referred him to a nutritionist. We also talked about DBS evaluation and he requested to see another specialist for DBS candidacy. I referred him to [name] at [facility] I also advised him to discontinue using the new protein supplement as I felt it may be interfering with the absorption of his Stalevo. I also suggested a trial of Parcopa half a pill 3 times a day at 11, 2 PM and 7 PM and we kept the Stalevo the same. He was advised to use MiraLAX as needed for constipation.   I saw him on 07/31/13, at which time [name], including swimming, yoga, and weights. He has been on Stalevo 100 mg every 2 1/2 hours starting at 7:30 AM for 6 doses. He stopped clonazepam, as he felt some daytime sluggishness. He has been taking C/L 1/2 tid in between. He had no cognitive issues, but did have some "downtime" midday. I did  not change his medications. He had an appointment with [name] at [facility] in June, but there was a mix up with his appointment and he does not have an appointment until next year.   In the interim, he was diagnosed with Lyme d/s in July. He was tested and treated by his PCP has has been referred to an infectious d/s doctor. He brings in his latest lying test results from 12/05/2013 which I reviewed: It looks like his IgM antibody status is negative but his IgG antibody status has not quite turn positive as far as I understand the report. That means he is probably in the subacute or early chronic phase if I interpret this correctly. He requested a sooner than scheduled appointment, due to feeling more freezing and more off time. This starts in the late morning hours but can also go into the afternoon after lunch. Cognitively and mood wise he feels stable. He does not sleep well at night. He has not consistently tried melatonin I recommended.   I saw him on 01/30/2013, at which time I felt he was stable. We talked about DBS surgery. I did not make any medication changes. He is aware of the increase risk of melanoma associated with DAs and his dermatologist is aware that he is taking Requip XL.   I first met him on 09/27/2012, which time I did not make any changes to his medication and did not order any new test as he was stable. He saw [name] at [facility] and discussed DBS in the past but was not deemed a cadidate for it at the time as he was doing well. He was told by [name] to stay on schedule with his medication. 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 Sleep has been an issue. He goes to sleep well, but wakes up after 45 minutes to 1 hour. He is seeing a urologist. He exercises regularly.   He previously followed with [name] and was last seen by him on 05/25/2012, at which time [name] felt he was stable, but because of off time at 2 hours he increased his carbidopa-levodopa  by adding half of a 10-100 mg strength 3 times a day to his regimen. He recommended decreasing his Stalevo to 125 mg from  100 mg 6 times a day. He has been on Stalevo 125 mg 6 times a day, at 7:30, 10, 12:30, 3 PM, 5:30 PM and 8 PM. He is on clonazepam 0.5 mg at night, Requip long-acting 6 mg twice daily, rasagiline 1 mg once daily, carbidopa-levodopa 10-100 mg strength half a tablet 3 times a day, 7:30 to 10, then midday and later in the evening.   He was followed by Dr. Sandria Manly since 2008 with a history of Parkinson's disease. He has a history of increased tone and slowness as well as tremor with R sided predominance. He was initially placed on Requip XL and then rasagiline was added in October 2008. In February 2010 Stalevo was added. He had low vitamin D levels in 2008. ANA, ESR, ceruloplasmin were normal except for slightly low ceruloplasmin of 16. Urine copper test was negative. Methylmalonic acid was normal. Urine for heavy metals is normal as well. MRI brain with and without contrast on 01/07/2007 was normal. He had a sleep study in October 2008 showing PLMs and evidence of RBD. Repeat sleep study in September 2011 showed supine obstructive sleep apnea and RBD. He had a CPAP titration study in late September 2011 which was not very successful. He has been on clonazepam at night. He has not noticed any compulsive behaviors, memory loss, depression, swelling, or orthostatic dizziness. He recently had a skin biopsy showing atypical cells, concerning for melanoma.   He works full-time as an Pensions consultant, Air traffic controller. He exercises regularly and does yoga.    His Past Medical History Is Significant For: Past Medical History  Diagnosis Date  . Parkinson disease (HCC)   . Sleep apnea     mild  . BPH (benign prostatic hyperplasia)   . Skin cancer of face     arms and back  . Spider bite     brown recluse/ left ankle/08/13    His Past Surgical History Is Significant For: Past Surgical History   Procedure Laterality Date  . Transurethral resection of prostate      2008  . Knee surgery      exploratory/left  . Colonoscopy    . Skin cancer excision  01/2014    face  . Burr hole w/ stereotactic insertion of dbs leads / intraop microelectrode recording      His Family History Is Significant For: Family History  Problem Relation Age of Onset  . COPD Mother   . Cancer Father     His Social History Is Significant For: Social History   Social History  . Marital Status: Married    Spouse Name: Marylene Land  . Number of Children: 4  . Years of Education: 30   Occupational History  . LAWYER    Social History Main Topics  . Smoking status: Never Smoker   . Smokeless tobacco: Never Used  . Alcohol Use: 3.0 oz/week    3 Glasses of wine, 2 Cans of beer per week     Comment: occasional  . Drug Use: No  . Sexual Activity: Not Asked   Other Topics Concern  . None   Social History Narrative   Patient is right handed and resides with wife   1-2 cups of coffee a day     His Allergies Are:  No Known Allergies:   His Current Medications Are:  Outpatient Encounter Prescriptions as of 07/09/2015  Medication Sig  . carbidopa-levodopa-entacapone (STALEVO 100) 25-100-200 MG per tablet Take one tablet by mouth four to six times  daily as directed  . desmopressin (DDAVP) 0.2 MG tablet Take 0.6 mg by mouth at bedtime.  . rasagiline (AZILECT) 1 MG TABS tablet Take 1 tablet (1 mg total) by mouth daily.  . Ropinirole HCl 6 MG TB24   . vardenafil (LEVITRA) 20 MG tablet Take 20 mg by mouth daily as needed for erectile dysfunction.   . [DISCONTINUED] Melatonin CR 3 MG TBCR Take 2 tablets by mouth at bedtime.  . [DISCONTINUED] rOPINIRole (REQUIP XL) 4 MG 24 hr tablet 6 mg.    No facility-administered encounter medications on file as of 07/09/2015.  :  Review of Systems:  Out of a complete 14 point review of systems, all are reviewed and negative with the exception of these symptoms as  listed below:   Review of Systems  Cardiovascular:       Patient reports that he has had some trouble with blood pressure lately (running low). Wore monitor for 24 hours that checked his blood pressure, has appt tomorrow to discuss these results.   Neurological:       No new concerns.     Objective:  Neurologic Exam  Physical Exam Physical Examination:   Filed Vitals:   07/09/15 1602  BP: 108/62  Pulse: 68  Resp: 16   General Examination: The patient is a very pleasant 70 y.o. male in no acute distress. He denies any lightheadedness upon standing. He is in good spirits today.  HEENT: Normocephalic, atraumatic, pupils are equal, round and reactive to light and accommodation. Extraocular tracking shows mild saccadic breakdown without nystagmus noted. He has some scarring on his nose, but looks well healed. There is no limitation to his gaze, but he has a slight sagging of the left eyelid, denies diplopia. There is mild decrease in eye blink rate. Hearing is intact. Face is symmetric with mild facial masking and normal facial sensation. There is no lip, neck or jaw tremor. Neck is moderately rigid with intact passive ROM. There are no carotid bruits on auscultation. Oropharynx exam reveals mild mouth dryness. No significant airway crowding is noted. Mallampati is class II. Tongue protrudes centrally and palate elevates symmetrically. There is no drooling. There are no dyskinesias. His stimulator wires are tunneled on the left side.   Chest: is clear to auscultation without wheezing, rhonchi or crackles noted. His generator site is not tender.   Heart: sounds are regular and normal without murmurs, rubs or gallops noted.   Abdomen: is soft, non-tender and non-distended with normal bowel sounds appreciated on auscultation.  Extremities: There is no edema in the distal lower extremities bilaterally.   Skin: is warm and dry with no trophic changes noted. Age-related changes are noted on the  skin and scars from skin cancer removals.   Musculoskeletal: exam reveals no obvious joint deformities, tenderness, joint swelling or erythema.  Neurologically:  Mental status: The patient is awake and alert, paying good  attention. He is able to completely provide the history. He is oriented to: person, place, time/date, situation, day of week, month of year and year. His memory, attention, language and knowledge are intact. There is no aphasia, agnosia, apraxia or anomia. There is a no significant degree of bradyphrenia. Speech is mildly hypophonic with no dysarthria noted. Mood is congruent and affect is normal.   Cranial nerves are as described above under HEENT exam. In addition, shoulder shrug is normal with equal shoulder height noted.  Motor exam: Normal bulk, and strength for age is noted. There are no dyskinesias  noted today.  Tone is mildly rigid with presence of cogwheeling in the right upper extremity. There is overall mild to moderate bradykinesia. There is no drift or rebound.  There is a no resting tremor today.   Romberg is negative.  Reflexes are 1+ in the upper extremities and 2+ in the lower extremities.   Fine motor skills exam:  Fine motor skills are mild to moderately impaired, more pronounced on the R than L, stable. Cerebellar testing shows no dysmetria or intention tremor on finger to nose testing. Heel to shin is unremarkable bilaterally. There is no truncal or gait ataxia.   Sensory exam is intact to light touch throughout .   Gait, station and balance: He stands up from the seated position with no significant difficulty and does not need to push up with His hands. He needs no assistance. No veering to one side is noted. He is not noted to lean to the side. Posture is mildly stooped, advanced for age, but stable. Stance is narrow-based. He walks with fairly good pace and stride length and reduced arm swing bilaterally. He has slight difficulty turning. Balance is  minimally impaired.     Assessment and Plan:   In summary, Ronald Noble is a very pleasant 70 year old male with a history of right-sided predominant Parkinson's disease, complicated by RBD, sleep disturbance, generalized dyskinesias, motor fluctuations, orthostatic hypotension, syncopal spells, falls, constipation and bladder hyperactivity, now s/p b/l DBS placement at [facility] on 06/01/14 and generator placement on 06/13/2014. Overall, he has done well. He did have 2 distinct syncopal spells that led to collapse, falls, and injuries. These happened fairly back to back in the summer of 2016. He had fractured his nose when he fell in his office, and then he also fell in his driveway and chipped a tooth. Since then, he had a crown placed. He had nose surgery. He has recently seen a vascular specialist and had a tilt table test at [facility]. He has a follow-up tomorrow after his 24-hour blood pressure monitor test. He also has a follow-up appointment with the nurse practitioner, followed by an appointment with [name] later on this year. I suggested he continue his current medication regimen. He is again advised to stay well-hydrated and change positions slowly. He is also advised to consider using compression stockings and having salty snacks at hand in case he does feel lightheaded. For his droopy eyelid, he is advised to follow-up with his ophthalmologist. He has no symptoms with the droopy eyelid, sometimes blepharoplasty is indicated especially if it causes double vision or change in vision secondary to droopy eyelid. I suggested an 8 month follow-up with me. I answered all his questions today and he was in agreement. He did not need any refills today. I spent 25 minutes in total face-to-face time with the patient, more than 50% of which was spent in counseling and coordination of care, reviewing test results, reviewing medication and discussing or reviewing the diagnosis of PD,  hypotension and  syncope, the prognosis and treatment adjustments down the road.

## 2015-07-10 DIAGNOSIS — G20 Parkinson's disease: Secondary | ICD-10-CM | POA: Diagnosis not present

## 2015-07-10 DIAGNOSIS — I951 Orthostatic hypotension: Secondary | ICD-10-CM | POA: Diagnosis not present

## 2015-07-17 ENCOUNTER — Telehealth: Payer: Self-pay | Admitting: Neurology

## 2015-07-17 ENCOUNTER — Other Ambulatory Visit: Payer: Self-pay | Admitting: *Deleted

## 2015-07-17 MED ORDER — CARBIDOPA-LEVODOPA-ENTACAPONE 25-100-200 MG PO TABS: ORAL_TABLET | ORAL | Status: DC

## 2015-07-17 MED ORDER — CARBIDOPA-LEVODOPA-ENTACAPONE 25-100-200 MG PO TABS: 1.0000 | ORAL_TABLET | Freq: Four times a day (QID) | ORAL | Status: DC

## 2015-07-17 NOTE — Telephone Encounter (Signed)
rx sent in. -VRP 

## 2015-07-17 NOTE — Telephone Encounter (Signed)
Spoke to pt and he relayed pharmacy changes.  [facility] [address]  and [facility] order.

## 2015-07-17 NOTE — Telephone Encounter (Signed)
Pt requesting refill on carbidopa-levodopa-entacapone (STALEVO 100) 25-100-200 MG per tablet. Please send to [facility] on [address].

## 2015-07-31 DIAGNOSIS — H02402 Unspecified ptosis of left eyelid: Secondary | ICD-10-CM | POA: Diagnosis not present

## 2015-07-31 DIAGNOSIS — H25813 Combined forms of age-related cataract, bilateral: Secondary | ICD-10-CM | POA: Diagnosis not present

## 2015-07-31 DIAGNOSIS — Z0100 Encounter for examination of eyes and vision without abnormal findings: Secondary | ICD-10-CM | POA: Diagnosis not present

## 2015-07-31 DIAGNOSIS — H43813 Vitreous degeneration, bilateral: Secondary | ICD-10-CM | POA: Diagnosis not present

## 2015-08-01 DIAGNOSIS — Z85828 Personal history of other malignant neoplasm of skin: Secondary | ICD-10-CM | POA: Diagnosis not present

## 2015-08-01 DIAGNOSIS — D225 Melanocytic nevi of trunk: Secondary | ICD-10-CM | POA: Diagnosis not present

## 2015-08-01 DIAGNOSIS — L814 Other melanin hyperpigmentation: Secondary | ICD-10-CM | POA: Diagnosis not present

## 2015-08-01 DIAGNOSIS — L821 Other seborrheic keratosis: Secondary | ICD-10-CM | POA: Diagnosis not present

## 2015-08-01 DIAGNOSIS — L565 Disseminated superficial actinic porokeratosis (DSAP): Secondary | ICD-10-CM | POA: Diagnosis not present

## 2015-08-01 DIAGNOSIS — D2261 Melanocytic nevi of right upper limb, including shoulder: Secondary | ICD-10-CM | POA: Diagnosis not present

## 2015-08-01 DIAGNOSIS — D1801 Hemangioma of skin and subcutaneous tissue: Secondary | ICD-10-CM | POA: Diagnosis not present

## 2015-08-01 DIAGNOSIS — L570 Actinic keratosis: Secondary | ICD-10-CM | POA: Diagnosis not present

## 2015-08-01 DIAGNOSIS — D2262 Melanocytic nevi of left upper limb, including shoulder: Secondary | ICD-10-CM | POA: Diagnosis not present

## 2015-08-15 ENCOUNTER — Ambulatory Visit: Payer: Medicare Other | Attending: Internal Medicine

## 2015-08-15 ENCOUNTER — Telehealth: Payer: Self-pay | Admitting: Neurology

## 2015-08-15 ENCOUNTER — Ambulatory Visit: Payer: Medicare Other | Admitting: Occupational Therapy

## 2015-08-15 ENCOUNTER — Ambulatory Visit: Payer: Medicare Other | Admitting: Physical Therapy

## 2015-08-15 DIAGNOSIS — G20 Parkinson's disease: Secondary | ICD-10-CM

## 2015-08-15 DIAGNOSIS — R278 Other lack of coordination: Secondary | ICD-10-CM | POA: Insufficient documentation

## 2015-08-15 DIAGNOSIS — R471 Dysarthria and anarthria: Secondary | ICD-10-CM | POA: Insufficient documentation

## 2015-08-15 DIAGNOSIS — R2689 Other abnormalities of gait and mobility: Secondary | ICD-10-CM

## 2015-08-15 NOTE — Therapy (Signed)
[facility] [address] [place], [name], [address] Phone: [phone]   Fax:  [phone]  Patient Details  Name: Ronald Noble MRN: [id number] Date of Birth: [birth year] Referring Provider:  [name], MD  Encounter Date: 08/15/2015   Physical Therapy Parkinson's Disease Screen   Timed Up and Go test:7.94 sec  10 meter walk test:7.9 sec ( 4.15 ft/sec)  5 time sit to stand test:9.07 sec   Patient does not require Physical Therapy services at this time.  Recommend Physical Therapy screen in 6-9 months.   [name] 08/15/2015, 8:08 AM  Frazier Butt., PT  [facility] [address] [place], [name], [address] Phone: [phone]   Fax:  [phone]

## 2015-08-15 NOTE — Therapy (Addendum)
[facility] [address] [place], [name], [address] Phone: [phone]   Fax:  [phone]  Patient Details  Name: Ronald Noble MRN: [id number] Date of Birth: 03-12-1946 Referring Provider:  [name], MD  Encounter Date: 08/15/2015 Occupational Therapy Parkinson's Disease Screen  Physical Performance Test item #2 (simulated eating):  12.08 secs  Physical Performance Test item #4 (donning/doffing jacket):  16.38 secs   9 hole peg test RUE 25.91 secs,,  LUE 36.53 secs   Change in ability to perform ADLs/IADLs:  Increased difficulty with handwriting with fatigue, reports micrographia at times  Other Comments:    Pt would benefit from occupational therapy evaluation due to  Change in ADLs/ coordination  [name] 08/15/2015, 8:44 AM  [facility] [address] [place], [name], [address] Phone: [phone]   Fax:  [phone]

## 2015-08-15 NOTE — Therapy (Signed)
[facility] [address] [place], [name], [address] Phone: [phone]   Fax:  [phone]  Patient Details  Name: Ronald Noble MRN: [id number] Date of Birth: 25-Oct-1945 Referring Provider:  [name], MD  Encounter Date: 08/15/2015  Speech Therapy Parkinson's Disease Screen   Decibel Level today: 65dB  (WNL=70-72 dB) with sound level meter 30cm away from pt's mouth. Pt's conversational volume has decreased markedly since last treatment course  Pt has not experienced difficulty in swallowing warranting objective evaluation  Pt would benefit from speech-language eval for dysarthria, please order via EPIC or call [phone] to schedule    [facility] ,MS, CCC-SLP  08/15/2015, 8:32 AM  [facility] [address] [place], [name], [address] Phone: [phone]   Fax:  [phone]

## 2015-08-15 NOTE — Telephone Encounter (Signed)
As per neuro rehab recs: please process ST and OT referrals.

## 2015-09-06 ENCOUNTER — Ambulatory Visit (INDEPENDENT_AMBULATORY_CARE_PROVIDER_SITE_OTHER): Payer: Medicare Other | Admitting: Family Medicine

## 2015-09-06 VITALS — BP 120/78 | HR 59 | Temp 98.3°F | Resp 15 | Ht 71.0 in | Wt 166.2 lb

## 2015-09-06 DIAGNOSIS — S0180XA Unspecified open wound of other part of head, initial encounter: Secondary | ICD-10-CM | POA: Diagnosis not present

## 2015-09-06 NOTE — Patient Instructions (Addendum)
     IF you received an x-ray today, you will receive an invoice from [facility]. Please contact [facility] at [phone] with questions or concerns regarding your invoice.   IF you received labwork today, you will receive an invoice from [employer]. Please contact [facility] at [phone] with questions or concerns regarding your invoice.   Our billing staff will not be able to assist you with questions regarding bills from these companies.  You will be contacted with the lab results as soon as they are available. The fastest way to get your results is to activate your My Chart account. Instructions are located on the last page of this paperwork. If you have not heard from [place] regarding the results in 2 weeks, please contact this office.     Luckily the wounds onyour face appear to be superficial. As we discussed, this area usually heals fairly quickly. Soap and water cleansing of the area once or twice per day, keep clean and covered with bandage as needed. You can use Polysporin over-the-counter if you would like, but often times no antibiotic ointment is needed.  If any worsening of the wound such as drainage, more redness around the wound, or persistent bleeding from the wound, return for recheck.    . Notify the office if you experience any of the following signs of infection: Swelling, redness, pus drainage, streaking, fever >101.0 F . Notify the office if you experience excessive bleeding that does not stop after 15-20 minutes of constant, firm pressure.

## 2015-09-06 NOTE — Progress Notes (Signed)
Subjective:  By signing my name below, I, Moises Blood, attest that this documentation has been prepared under the direction and in the presence of Merri Ray, MD. Electronically Signed: Moises Blood, [place]. 09/06/2015 , 10:35 AM .  Patient was seen in Room 4 .   Patient ID: Ronald Noble, male    DOB: [birth year], 70 y.o.   MRN: [id number] Chief Complaint  Patient presents with  . Facial Laceration    [place] out of bed this morning    HPI Ronald Noble is a 70 y.o. male  Patient states he was sleeping too close to the edge of his bed and he turned, falling off his bed. He caught his right cheek on the edge of the night stand. This occurred at 5:00AM this morning. He's cleaned the area with hydrogen peroxide and applied neosporin over the area. He also applied 2 bandages over the laceration. He denies previous injury to the area. He denies any blurry vision or visual disturbances. He denies any dental problems. He denies headaches or dizziness.   Patient Active Problem List   Diagnosis Date Noted  . Parkinson disease [facility])    Past Medical History  Diagnosis Date  . Parkinson disease ([place])   . Sleep apnea     mild  . BPH (benign prostatic hyperplasia)   . Skin cancer of face     arms and back  . Spider bite     brown recluse/ left ankle/08/13   Past Surgical History  Procedure Laterality Date  . Transurethral resection of prostate      2008  . Knee surgery      exploratory/left  . Colonoscopy    . Skin cancer excision  01/2014    face  . Burr hole w/ stereotactic insertion of dbs leads / intraop microelectrode recording     No Known Allergies Prior to Admission medications   Medication Sig Start Date End Date Taking? Authorizing Provider  carbidopa-levodopa-entacapone (STALEVO 100) 25-100-200 MG tablet Take 1 tablet by mouth 4 (four) times daily. 07/17/15  Yes Star Age, MD  desmopressin (DDAVP) 0.2 MG tablet Take 0.6 mg by mouth at bedtime. 10/11/14   Yes Historical Provider, MD  Ropinirole HCl 6 MG TB24  01/02/15  Yes Historical Provider, MD  rasagiline (AZILECT) 1 MG TABS tablet Take 1 tablet (1 mg total) by mouth daily. Patient not taking: Reported on 09/06/2015 05/30/14   Star Age, MD  vardenafil (LEVITRA) 20 MG tablet Take 20 mg by mouth daily as needed for erectile dysfunction. Reported on 09/06/2015    Historical Provider, MD   Social History   Social History  . Marital Status: Married    Spouse Name: Levada Dy  . Number of Children: 4  . Years of Education: 14   Occupational History  . LAWYER    Social History Main Topics  . Smoking status: Never Smoker   . Smokeless tobacco: Never Used  . Alcohol Use: 3.0 oz/week    3 Glasses of wine, 2 Cans of beer per week     Comment: occasional  . Drug Use: No  . Sexual Activity: Not on file   Other Topics Concern  . Not on file   Social History Narrative   Patient is right handed and resides with wife   1-2 cups of coffee a day    Review of Systems  Constitutional: Negative for fever, chills and fatigue.  HENT: Negative for dental problem.   Eyes: Negative for visual  disturbance.  Gastrointestinal: Negative for nausea, vomiting and diarrhea.  Skin: Positive for wound. Negative for rash.  Neurological: Negative for dizziness, weakness and headaches.       Objective:   Physical Exam  Constitutional: He is oriented to person, place, and time. He appears well-developed and well-nourished. No distress.  HENT:  Head: Normocephalic and atraumatic. Head is without raccoon's eyes.  Jaw non-tender, painfree movement, no bony tenderness  Eyes: EOM are normal. Pupils are equal, round, and reactive to light.  Neck: Neck supple.  Cardiovascular: Normal rate.   Pulmonary/Chest: Effort normal. No respiratory distress.  Musculoskeletal: Normal range of motion.  Neurological: He is alert and oriented to person, place, and time.  Skin: Skin is warm and dry.  2 small superficial  lacerations over the right maxillary prominence, slight widening with tension on the upper wound, upper wound measures at 34mm, lower wound measures at 71mm; no active bleeding, no surrounding erythema or bruising  Psychiatric: He has a normal mood and affect. His behavior is normal.  Nursing note and vitals reviewed.   Filed Vitals:   09/06/15 0930  BP: 120/78  Pulse: 59  Temp: 98.3 F (36.8 C)  TempSrc: Oral  Resp: 15  Height: 5\' 11"  (1.803 m)  Weight: 166 lb 3.2 oz (75.388 kg)  SpO2: 96%      Assessment & Plan:  Ronald Noble is a 70 y.o. male Open wound, face, without complication, initial encounter  - 2 superficial wounds, do not appear to need repair at this point. No sign of concussion or other head injury, no bony tenderness. No complications seen with currently. Symptomatic care discussed with soap and water cleansing, keep wound clean and covered as needed. RTC precautions discussed.  No orders of the defined types were placed in this encounter.   Patient Instructions       IF you received an x-ray today, you will receive an invoice from [facility]. Please contact [facility] at [phone] with questions or concerns regarding your invoice.   IF you received labwork today, you will receive an invoice from [employer]. Please contact [facility] at [phone] with questions or concerns regarding your invoice.   Our billing staff will not be able to assist you with questions regarding bills from these companies.  You will be contacted with the lab results as soon as they are available. The fastest way to get your results is to activate your My Chart account. Instructions are located on the last page of this paperwork. If you have not heard from [place] regarding the results in 2 weeks, please contact this office.     Luckily the wounds onyour face appear to be superficial. As we discussed, this area usually heals fairly  quickly. Soap and water cleansing of the area once or twice per day, keep clean and covered with bandage as needed. You can use Polysporin over-the-counter if you would like, but often times no antibiotic ointment is needed.  If any worsening of the wound such as drainage, more redness around the wound, or persistent bleeding from the wound, return for recheck.    . Notify the office if you experience any of the following signs of infection: Swelling, redness, pus drainage, streaking, fever >101.0 F . Notify the office if you experience excessive bleeding that does not stop after 15-20 minutes of constant, firm pressure.      I personally performed the services described in this documentation, which was scribed in my presence.  The recorded information has been reviewed and considered, and addended by me as needed.   Signed,   Merri Ray, MD [facility].  09/06/2015 10:52 AM

## 2015-09-16 DIAGNOSIS — G20 Parkinson's disease: Secondary | ICD-10-CM | POA: Diagnosis not present

## 2015-09-16 DIAGNOSIS — Z4542 Encounter for adjustment and management of neuropacemaker (brain) (peripheral nerve) (spinal cord): Secondary | ICD-10-CM | POA: Diagnosis not present

## 2015-10-22 ENCOUNTER — Telehealth: Payer: Self-pay | Admitting: Neurology

## 2015-10-22 DIAGNOSIS — G20 Parkinson's disease: Secondary | ICD-10-CM

## 2015-10-22 MED ORDER — RASAGILINE MESYLATE 1 MG PO TABS: 1.0000 mg | ORAL_TABLET | Freq: Every day | ORAL | Status: DC

## 2015-10-22 NOTE — Telephone Encounter (Signed)
Patient called to request refill of AZILECT to [facility]. Was advised by pharmacy, this medication is available in generic. Would like generic if okay with Dr. Rexene Alberts. If generic, if generic is only available in 90 day  Supply, he will do 90 day supply, otherwise prefers 30 day supply, if brand would like a 30 day supply.

## 2015-10-22 NOTE — Telephone Encounter (Signed)
Looks like this medication was taken out of his chart at PCP visit in June. No indication for him to stop it.

## 2015-10-22 NOTE — Telephone Encounter (Signed)
90 Rx for generic rasagiline 1 mg strength once daily sent to [facility].

## 2015-10-29 ENCOUNTER — Ambulatory Visit: Payer: Medicare Other | Attending: Internal Medicine | Admitting: Occupational Therapy

## 2015-10-29 ENCOUNTER — Ambulatory Visit: Payer: Medicare Other | Admitting: Speech Pathology

## 2015-10-29 ENCOUNTER — Encounter: Payer: Self-pay | Admitting: Occupational Therapy

## 2015-10-29 DIAGNOSIS — M25611 Stiffness of right shoulder, not elsewhere classified: Secondary | ICD-10-CM | POA: Diagnosis not present

## 2015-10-29 DIAGNOSIS — R471 Dysarthria and anarthria: Secondary | ICD-10-CM

## 2015-10-29 DIAGNOSIS — R293 Abnormal posture: Secondary | ICD-10-CM | POA: Diagnosis not present

## 2015-10-29 DIAGNOSIS — R41840 Attention and concentration deficit: Secondary | ICD-10-CM | POA: Diagnosis not present

## 2015-10-29 DIAGNOSIS — R29818 Other symptoms and signs involving the nervous system: Secondary | ICD-10-CM | POA: Diagnosis not present

## 2015-10-29 DIAGNOSIS — R29898 Other symptoms and signs involving the musculoskeletal system: Secondary | ICD-10-CM | POA: Diagnosis not present

## 2015-10-29 DIAGNOSIS — R278 Other lack of coordination: Secondary | ICD-10-CM | POA: Diagnosis not present

## 2015-10-29 DIAGNOSIS — M25612 Stiffness of left shoulder, not elsewhere classified: Secondary | ICD-10-CM | POA: Insufficient documentation

## 2015-10-29 DIAGNOSIS — R2681 Unsteadiness on feet: Secondary | ICD-10-CM | POA: Diagnosis not present

## 2015-10-29 NOTE — Patient Instructions (Signed)
   Loud AH!! 5x twice a day   Read aloud feeling like you are shouting  It's OK to feel like you are talking too loud!  At home or when you are tired:  Face the person you are talking too, be in close proximity to the person, be aware of environmental noise (TV, appliances, motors etc) and turn these off or go into a quiet environment to save energy.  Swallow hard  Stick out and swallow  Swallow with your mouth open  Pitch changes     ABDOMINAL BREATHING   . Shoulders down - this is a cue to relax . Place your hand on your abdomen - this helps you focus on easy abdominal breath support - the best and most relaxed way to breathe . Breathe in through your nose and fill your belly with air, watching your hand move outward . Breathe out through your mouth and watch your belly move in. An audible "sh"  may help   Think of your belly as a balloon, when you fill with air (inhale), the balloon gets bigger. As the air goes out (exhale), the balloon deflates.  If you are having difficulty coordinating this, lay on your back with a plastic cup on your belly and repeat the above steps, watching you belly move up with inhalation and down with exhalations  Practice breathing in and out in front of a mirror, watching your belly Breathe in for a count of 5 and breathe out for a count of 5   Practice this throughout the day when you are not having symptoms. For example: in the car, when watching TV, before medications. Regular practice when you are feeling well is important.     Be patient when completing the breathing. It may take several minutes to start feeling relief     There's an App for that: Breathe2relax  Provided by: Georgiann Hahn. SLP [phone]

## 2015-10-29 NOTE — Therapy (Signed)
[facility] [address] [place], [name], [address] Phone: [phone]   Fax:  [phone]  Occupational Therapy Evaluation  Patient Details  Name: Ronald Noble MRN: [id number] Date of Birth: 21-Aug-1945 Referring Provider: Dr. Star Age  Encounter Date: 10/29/2015      OT End of Session - 10/29/15 1442    Visit Number 1   Number of Visits 17   Date for OT Re-Evaluation 12/29/15   Authorization Type MCR 1*, BC/BS 2* - G Code needed   Authorization - Visit Number 1   Authorization - Number of Visits 10   OT Start Time 0800   OT Stop Time 0850   OT Time Calculation (min) 50 min   Activity Tolerance Patient tolerated treatment well      Past Medical History:  Diagnosis Date  . BPH (benign prostatic hyperplasia)   . Parkinson disease ([place])   . Skin cancer of face    arms and back  . Sleep apnea    mild  . Spider bite    brown recluse/ left ankle/08/13    Past Surgical History:  Procedure Laterality Date  . BURR HOLE W/ STEREOTACTIC INSERTION OF DBS LEADS / INTRAOP MICROELECTRODE RECORDING    . COLONOSCOPY    . KNEE SURGERY     exploratory/left  . SKIN CANCER EXCISION  01/2014   face  . TRANSURETHRAL RESECTION OF PROSTATE     2008    There were no vitals filed for this visit.      Subjective Assessment - 10/29/15 0810    Subjective  I'm still working   Pertinent History PD since 2008   Limitations DBS, falls, orthostatic hypotension   Patient Stated Goals increase handwriting   Currently in Pain? No/denies  occasionally mid to lower back if medications wear off           [facility] OT Assessment - 10/29/15 0001      Assessment   Diagnosis PD   Referring Provider Dr. Star Age   Onset Date --  Since 2008   Assessment Pt has had DBS bilaterally for 2 years, pt reports orthostatic hypotension, sleep disorders, shuffling gait, and increased difficulty performing ADLS with off med  times   Prior Therapy PT     Precautions   Precautions Fall  DBS   Precaution Comments DBS both sides since 2015, falls, orthostatic hypotension     Restrictions   Weight Bearing Restrictions No     Balance Screen   Has the patient fallen in the past 6 months No   Has the patient had a decrease in activity level because of a fear of falling?  No   Is the patient reluctant to leave their home because of a fear of falling?  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   Leisure exercise - walking, swimming, machines     ADL   Eating/Feeding Independent  difficulty cutting food with down times   Grooming Independent  increased time   Upper Body Bathing Independent   Lower Body Bathing Independent   Upper Body Dressing --  increased difficulty with cuff buttons, don/doff  jacket, pulling shirt down in back   Lower Body Dressing --  increased time with down time, sometimes needs to sit   Toilet Tranfer Independent   [place]   ADL comments difficulty pulling shirt down in back, donning/doffing jacket, cuff and top buttons of shirt     IADL   Light Housekeeping --  pt does yardwork, painting   Meal Prep Able to complete simple warm meal prep;Able to complete simple cold meal and snack prep  wife performs mostly   Chief Financial Officer financial matters independently (budgets, writes checks, pays rent, bills goes to [employer]), collects and keeps track of income     Mobility   Mobility Status Independent;History of falls   Mobility Status Comments Pt with decreased postural reactions, orthostatic  hypotension     Written Expression   Dominant Hand Right   Handwriting 90% legible;Mild micrographia  with longer writing     Vision - History   Baseline Vision Wears glasses only for reading  and driving   Additional Comments ptosis reported Lt eye with fatigue, down times     Cognition   Overall Cognitive Status --  decreased awareness noted into deficits     Observation/Other Assessments   Simulated Eating Time (seconds) 9.41 sec   Donning Doffing Jacket Time (seconds) 28.32 sec   Donning Doffing Jacket Comments 3 button/unbutton test:  33.32 sec     Coordination   9 Hole Peg Test Right;Left   Right 9 Hole Peg Test 23.50 sec   Left 9 Hole Peg Test 30.22 sec   Box and Blocks Rt = 50 with bradykinesia noted (-3 not clearing divider). Lt = 52 with compensations (not fully going over divider, throwing over)   Coordination Pt reports no tremors     Tone   Assessment Location Right Upper Extremity;Left Upper Extremity     ROM / Strength   AROM / PROM / Strength AROM     AROM   Overall AROM Comments WNL's, elbows extended but pt reports stiffness bilateral shoulders     RUE Tone   RUE Tone Mild;Moderate  rigidity     LUE Tone   LUE Tone Mild  rigidity                           OT Short Term Goals - 10/29/15 1451      OT SHORT TERM GOAL #1   Title Pt will be independent with PD specific HEP (All STG's due 11/29/15)    Time 4   Period Weeks   Status New     OT SHORT TERM GOAL #2   Title Pt will write at least 3 sentences with no significant decr in size maintaining legibility   Time 4   Period Weeks   Status New     OT SHORT TERM GOAL #3   Title Pt will verbalize understanding of ways to prevent future PD related complications and appropriate community resources prn   Time 4   Period Weeks   Status New     OT SHORT TERM GOAL #4   Title Pt to hook/unhook 3 buttons for UE dressing in 28 sec. or less   Baseline eval = 33.32 sec   Time 4    Period Weeks   Status New     OT SHORT TERM GOAL #5  Title Pt will perform PPT #4 in 23 sec. or less for donning/doffing jacket   Baseline eval = 28.32 sec.    Time 4   Period Weeks   Status New           OT Long Term Goals - 10/29/15 1458      OT LONG TERM GOAL #1   Title Pt will verbalize understanding of adaptive strategies to incr ease with ADL's/IADLS's (All LTG's due 12/29/15)   Time 8   Period Weeks   Status New     OT LONG TERM GOAL #2   Title Pt will verbalize understanding of ways to keep thinking skills sharp, compensate for memory changes in the future, and increase awareness of PD complications/deficits   Time 8   Period Weeks   Status New     OT LONG TERM GOAL #3   Title Pt will hook and unhook 3 buttons for incr ease with UE dressing in 24 sec. or less   Baseline eval = 33.32 sec   Time 8   Period Weeks   Status New     OT LONG TERM GOAL #4   Title Pt will perform PPT #4 in 18 sec. or less for increased ease with donning/doffing jacket   Baseline eval = 28.32 sec.    Time 8   Period Weeks   Status New     OT LONG TERM GOAL #5   Title TBD - appropriate standing functional reach goal prn    Time 8   Period Weeks   Status New               Plan - 10/29/15 1444    Clinical Impression Statement Pt is a 70 y.o. male who presents to outpatient rehab for moderately complex O.T. evaluation, diagnosed with Parkinsons disease in 2008, DBS placement bilaterally approx 2 years ago. Pt also with h/o falls, orthostatic hypotension, sleep disorders, and restless leg syndrome. Pt presents with bradykinesia, rigidity, decreased coordination, decreased mobility, abnormal posture, and mild cognitive deficits. Pt would benefit from O.T. to address these deficits in order to prevent future PD complications, establish PD specific HEP, and incr. ease and safety with ADLS/IADLS for improved quality of life   Rehab Potential Good   OT Frequency 2x / week   OT  Duration 8 weeks  plus evaluation   OT Treatment/Interventions Self-care/ADL training;Moist Heat;DME and/or AE instruction;Patient/family education;Therapeutic exercises;Therapeutic activities;Neuromuscular education;Functional Mobility Training;Passive range of motion;Manual Therapy;Cognitive remediation/compensation;Visual/perceptual remediation/compensation;Energy conservation   Plan Assess functional reach test and write LTG prn, PWR! Seated basic 4, PWR! Hands   Consulted and Agree with Plan of Care Patient      Patient will benefit from skilled therapeutic intervention in order to improve the following deficits and impairments:  Decreased coordination, Difficulty walking, Impaired flexibility, Improper body mechanics, Decreased endurance, Decreased safety awareness, Improper spinal/pelvic alignment, Decreased knowledge of precautions, Decreased activity tolerance, Impaired tone, Decreased balance, Decreased knowledge of use of DME, Impaired UE functional use, Decreased cognition, Decreased mobility, Impaired vision/preception, Impaired perceived functional ability  Visit Diagnosis: Other symptoms and signs involving the nervous system - Plan: Ot plan of care cert/re-cert  Other symptoms and signs involving the musculoskeletal system - Plan: Ot plan of care cert/re-cert  Other lack of coordination - Plan: Ot plan of care cert/re-cert  Abnormal posture - Plan: Ot plan of care cert/re-cert  Unsteadiness on feet - Plan: Ot plan of care cert/re-cert  Stiffness of right shoulder, not elsewhere classified -  Plan: Ot plan of care cert/re-cert  Stiffness of left shoulder, not elsewhere classified - Plan: Ot plan of care cert/re-cert  Attention and concentration deficit - Plan: Ot plan of care cert/re-cert      G-Codes - [id number]    Functional Assessment Tool Used PPT #4 = 28.32 sec., 3 button test = 33.32 sec.    Functional Limitation Self care   Self Care Current Status [phone]) At  least 20 percent but less than 40 percent impaired, limited or restricted   Self Care Goal Status [id number]) At least 1 percent but less than 20 percent impaired, limited or restricted      Problem List Patient Active Problem List   Diagnosis Date Noted  . Parkinson disease [facility])     Carey Bullocks, OTR/L 10/29/2015, 3:12 PM  [facility] [address] [place], [name], [address] Phone: [phone]   Fax:  [phone]  Name: Ronald Noble MRN: [id number] Date of Birth: 1945-10-24

## 2015-10-29 NOTE — Therapy (Signed)
[facility] [address] [place], [name], [address] Phone: [phone]   Fax:  [phone]  Speech Language Pathology Evaluation  Patient Details  Name: Ronald Noble MRN: [id number] Date of Birth: November 04, 1945 Referring Provider: Dr. Star Age  Encounter Date: 10/29/2015      End of Session - 10/29/15 1201    Visit Number 1   Number of Visits 17   Date for SLP Re-Evaluation 12/24/15   SLP Start Time 0846   SLP Stop Time  0930   SLP Time Calculation (min) 44 min   Activity Tolerance Patient tolerated treatment well      Past Medical History:  Diagnosis Date  . BPH (benign prostatic hyperplasia)   . Parkinson disease ([place])   . Skin cancer of face    arms and back  . Sleep apnea    mild  . Spider bite    brown recluse/ left ankle/08/13    Past Surgical History:  Procedure Laterality Date  . BURR HOLE W/ STEREOTACTIC INSERTION OF DBS LEADS / INTRAOP MICROELECTRODE RECORDING    . COLONOSCOPY    . KNEE SURGERY     exploratory/left  . SKIN CANCER EXCISION  01/2014   face  . TRANSURETHRAL RESECTION OF PROSTATE     2008    There were no vitals filed for this visit.      Subjective Assessment - 10/29/15 0853    Subjective "I have not been talking loud - I think that people can hear me and they can't"   Currently in Pain? No/denies            SLP Evaluation [facility] - 10/29/15 1204      SLP Visit Information   Referring Provider Dr. Star Age                         SLP Education - 10/29/15 1159    Education provided Yes   Education Details Loud AH!, compensations for dysarthria   Person(s) Educated Patient   Methods Explanation;Demonstration;Handout;Verbal cues          SLP Short Term Goals - 10/29/15 1207      SLP SHORT TERM GOAL #1   Title Pt will average 93dB on loud /a/ over 3 sessions with rare min A   Time 4   Period Weeks   Status New     SLP  SHORT TERM GOAL #2   Title Pt will average 72dB on structured speech tasks with rare min A   Time 4   Period Weeks   Status New     SLP SHORT TERM GOAL #3   Title Pt will maintain 70dB over 8 minute simple conversation with occasional min A   Time 4   Period Weeks   Status New          SLP Long Term Goals - 10/29/15 1209      SLP LONG TERM GOAL #1   Title Pt will maintain 70dB over 15 minute conversation with rare min A   Time 8   Period Weeks   Status New     SLP LONG TERM GOAL #2   Title Pt will maintain audible/intelligible speech in noisy environment outside of tx room over 12 minutes with rare min A   Time 8   Period Weeks   Status New          Plan - 10/29/15 1204    Clinical  Impression Statement Ronald Noble, a 70 y.o male with PD is known to [place] from prior course of ST. He is referred today due to reduced speaking volume. He presents with moderate hypokinetic dysarthria. He reports difficulty communicating at work and that his wife asks him to repeat himself frequently. Pt is currently a practicing attorney.  Over 10 minutes of conversational speech was reduced today, at average 67dB (WNL= average 70-72dB) with range of 65 to 69 dB, when a sound level meter was placed 30 cm away from pt's mouth. Overall intelligibility for this listener in a quiet environment was approx 95%. Production of loud /a/ averaged 94dB (range of 92  to 96) and occasional moderate cues  needed for loudness. Pt actually started out at 96dB with visible neck tension and reddened face. I instructed pt to reduce the tension in his neck, use less force and let the force come from his abdomen to shape a more appropriate /a/. Pt required education that his vocal folds are not muscles and too much laryngeal force could damage vocal folds.  Oral motor assessment revealed reduced lingual ROM and WFL  lingual strength. Labial ROM was reduced right and strength was [facility]. Velar ROM appeared [facility]. Lingual and  labial coordination are reduced with rapid alternating movements and tremor noted.   Imprecise consonants and incoordination of lips and tongue during speech was observed occasionally. Pt did agree that his speech gets "slurred" when he is on his downtime with his meds.   Pt rated effort level at 8/10 for production of loud /a/ (10=maximal effort). In oral reading tasks, pt was asked to use the same amount of effort as with loud /a/. Loudness average with this increased effort was 69dB (range of 68 to 70) with occasional mod  A  for loudness. Pt would benefit from skilled ST in order to improve speech intelligibility and pt's QOL.    Speech Therapy Frequency 2x / week   Duration --  8 weeks   Treatment/Interventions Compensatory strategies;Oral motor exercises;Functional tasks;Multimodal communcation approach;Patient/family education;SLP instruction and feedback;Internal/external aids      Patient will benefit from skilled therapeutic intervention in order to improve the following deficits and impairments:   Dysarthria and anarthria      G-Codes - Nov 08, 2015 0925    Functional Assessment Tool Used NOMS   Functional Limitations Motor speech   Motor Speech Current Status [phone]) At least 40 percent but less than 60 percent impaired, limited or restricted   Motor Speech Goal Status [id number]) At least 20 percent but less than 40 percent impaired, limited or restricted      Problem List Patient Active Problem List   Diagnosis Date Noted  . Parkinson disease ([place])     [place], Annye Rusk MS, CCC-SLP Nov 08, 2015, 12:25 PM  [facility] [address] [place], [name], [address] Phone: [phone]   Fax:  [phone]  Name: Ronald Noble MRN: [id number] Date of Birth: 1945/10/16

## 2015-11-14 ENCOUNTER — Ambulatory Visit: Payer: Medicare Other

## 2015-11-14 ENCOUNTER — Ambulatory Visit: Payer: Medicare Other | Attending: Internal Medicine | Admitting: Occupational Therapy

## 2015-11-14 DIAGNOSIS — R29898 Other symptoms and signs involving the musculoskeletal system: Secondary | ICD-10-CM

## 2015-11-14 DIAGNOSIS — R278 Other lack of coordination: Secondary | ICD-10-CM | POA: Insufficient documentation

## 2015-11-14 DIAGNOSIS — R2689 Other abnormalities of gait and mobility: Secondary | ICD-10-CM | POA: Diagnosis not present

## 2015-11-14 DIAGNOSIS — M25612 Stiffness of left shoulder, not elsewhere classified: Secondary | ICD-10-CM

## 2015-11-14 DIAGNOSIS — R293 Abnormal posture: Secondary | ICD-10-CM | POA: Diagnosis not present

## 2015-11-14 DIAGNOSIS — R471 Dysarthria and anarthria: Secondary | ICD-10-CM | POA: Diagnosis not present

## 2015-11-14 DIAGNOSIS — R29818 Other symptoms and signs involving the nervous system: Secondary | ICD-10-CM | POA: Diagnosis not present

## 2015-11-14 DIAGNOSIS — R2681 Unsteadiness on feet: Secondary | ICD-10-CM | POA: Insufficient documentation

## 2015-11-14 DIAGNOSIS — M25611 Stiffness of right shoulder, not elsewhere classified: Secondary | ICD-10-CM | POA: Diagnosis not present

## 2015-11-14 NOTE — Therapy (Signed)
[facility] [address] [place], [name], [address] Phone: [phone]   Fax:  [phone]  Speech Language Pathology Treatment  Patient Details  Name: Ayce Bombara MRN: [id number] Date of Birth: 04-12-1945 Referring Provider: Dr. Star Age  Encounter Date: 11/14/2015      End of Session - 11/14/15 1726    Visit Number 2   Number of Visits 17   Date for SLP Re-Evaluation 12/24/15   SLP Start Time [id number]   SLP Stop Time  1446   SLP Time Calculation (min) 41 min   Activity Tolerance Patient tolerated treatment well      Past Medical History:  Diagnosis Date  . BPH (benign prostatic hyperplasia)   . Parkinson disease ([place])   . Skin cancer of face    arms and back  . Sleep apnea    mild  . Spider bite    brown recluse/ left ankle/08/13    Past Surgical History:  Procedure Laterality Date  . BURR HOLE W/ STEREOTACTIC INSERTION OF DBS LEADS / INTRAOP MICROELECTRODE RECORDING    . COLONOSCOPY    . KNEE SURGERY     exploratory/left  . SKIN CANCER EXCISION  01/2014   face  . TRANSURETHRAL RESECTION OF PROSTATE     2008    There were no vitals filed for this visit.      Subjective Assessment - 11/14/15 1409    Subjective "(Mickel Baas) gave me some homework and I didn't do it."   Currently in Pain? No/denies               ADULT SLP TREATMENT - 11/14/15 1409      General Information   Behavior/Cognition Alert;Cooperative;Pleasant mood     Treatment Provided   Treatment provided Cognitive-Linquistic     Cognitive-Linquistic Treatment   Treatment focused on Dysarthria   Skilled Treatment In order to recalibrate pt's conversational loudness pt produced loud /a/ with average 93dB. Pt req'd min A initially for louder /a/. Pt read multisentence material with rare min A. In multisentence responses, pt req'd cues to cease verbalization and remain on topic.  Pt was better with consistency of WNL  loudness with succint verbalization.      Assessment / Recommendations / Plan   Plan Continue with current plan of care     Progression Toward Goals   Progression toward goals Progressing toward goals            SLP Short Term Goals - 11/14/15 1411      SLP SHORT TERM GOAL #1   Title Pt will average 93dB on loud /a/ over 3 sessions with rare min A   Time 4   Period Weeks   Status On-going     SLP SHORT TERM GOAL #2   Title Pt will average 72dB on structured speech tasks with rare min A   Time 4   Period Weeks   Status On-going     SLP SHORT TERM GOAL #3   Title Pt will maintain 70dB over 8 minute simple conversation with occasional min A   Time 4   Period Weeks   Status On-going          SLP Long Term Goals - 11/14/15 1411      SLP LONG TERM GOAL #1   Title Pt will maintain 70dB over 15 minute conversation with rare min A   Time 8   Period Weeks   Status On-going  SLP LONG TERM GOAL #2   Title Pt will maintain audible/intelligible speech in noisy environment outside of tx room over 12 minutes with rare min A   Time 8   Period Weeks   Status On-going          Plan - 11/14/15 1727    Clinical Impression Statement Mr. Purifoy continues requiring skilled ST for reduced speaking volume and rushes of speech which diminish speech intelligbility. Pt demonstrated decr'd attention today by tangential speech in structured speech tasks, but self corrected to cease extraneous verbalization by the end of the session.    Speech Therapy Frequency 2x / week   Duration --  8 weeks   Treatment/Interventions Compensatory strategies;Oral motor exercises;Functional tasks;Multimodal communcation approach;Patient/family education;SLP instruction and feedback;Internal/external aids      Patient will benefit from skilled therapeutic intervention in order to improve the following deficits and impairments:   Dysarthria and anarthria    Problem List Patient Active Problem  List   Diagnosis Date Noted  . Parkinson disease ([place])     [place] ,[place], CCC-SLP  11/14/2015, 5:29 PM  [facility] [address] [place], [name], [address] Phone: [phone]   Fax:  [phone]   Name: Jahaan Pavlock MRN: [id number] Date of Birth: Mar 01, 1946

## 2015-11-14 NOTE — Patient Instructions (Addendum)
   Five loud "ah" twice a day  Read out loud for 90 seconds, twice, twice a day   Please complete the assigned speech therapy homework and return it to your next session.

## 2015-11-14 NOTE — Therapy (Signed)
[facility] [address] [place], [name], [address] Phone: [phone]   Fax:  [phone]  Occupational Therapy Treatment  Patient Details  Name: Ronald Noble MRN: [id number] Date of Birth: 09-10-1945 Referring Provider: Dr. Star Age  Encounter Date: 11/14/2015      OT End of Session - 11/14/15 0904    Visit Number 2   Number of Visits 17   Date for OT Re-Evaluation 12/29/15   Authorization Type MCR 1*, BC/BS 2* - G Code needed   Authorization - Visit Number 2   Authorization - Number of Visits 10   OT Start Time 0815  pt thought appt was at 8:00, but was 8:45 (therapist took early)   OT Stop Time 0845   OT Time Calculation (min) 30 min   Activity Tolerance Patient tolerated treatment well      Past Medical History:  Diagnosis Date  . BPH (benign prostatic hyperplasia)   . Parkinson disease ([place])   . Skin cancer of face    arms and back  . Sleep apnea    mild  . Spider bite    brown recluse/ left ankle/08/13    Past Surgical History:  Procedure Laterality Date  . BURR HOLE W/ STEREOTACTIC INSERTION OF DBS LEADS / INTRAOP MICROELECTRODE RECORDING    . COLONOSCOPY    . KNEE SURGERY     exploratory/left  . SKIN CANCER EXCISION  01/2014   face  . TRANSURETHRAL RESECTION OF PROSTATE     2008    There were no vitals filed for this visit.      Subjective Assessment - 11/14/15 0856    Subjective  pt does report back pain with off med times   Pertinent History PD since 2008   Limitations DBS, falls, orthostatic hypotension   Patient Stated Goals increase handwriting   Currently in Pain? No/denies            [facility] OT Assessment - 11/14/15 0001      Observation/Other Assessments   Standing Functional Reach Test 16" bilaterally                  OT Treatments/Exercises (OP) - 11/14/15 0001      Neurological Re-education Exercises   Other Exercises 1 PWR! seated basic  4 x 20 reps each with cues for large amplitude movements, Rt elbow extension, trunk extension, and following hands with eyes during PWR! rock and twist                 OT Education - 11/14/15 [id number]    Education provided Yes   Education Details PWR! Seated basic 4   Person(s) Educated Patient   Methods Explanation;Demonstration;Verbal cues;Handout   Comprehension Verbalized understanding;Returned demonstration;Verbal cues required;Need further instruction          OT Short Term Goals - 10/29/15 1451      OT SHORT TERM GOAL #1   Title Pt will be independent with PD specific HEP (All STG's due 11/29/15)    Time 4   Period Weeks   Status New     OT SHORT TERM GOAL #2   Title Pt will write at least 3 sentences with no significant decr in size maintaining legibility   Time 4   Period Weeks   Status New     OT SHORT TERM GOAL #3   Title Pt will verbalize understanding of ways to prevent future PD related complications and appropriate community resources prn  Time 4   Period Weeks   Status New     OT SHORT TERM GOAL #4   Title Pt to hook/unhook 3 buttons for UE dressing in 28 sec. or less   Baseline eval = 33.32 sec   Time 4   Period Weeks   Status New     OT SHORT TERM GOAL #5   Title Pt will perform PPT #4 in 23 sec. or less for donning/doffing jacket   Baseline eval = 28.32 sec.    Time 4   Period Weeks   Status New           OT Long Term Goals - 11/14/15 0908      OT LONG TERM GOAL #1   Title Pt will verbalize understanding of adaptive strategies to incr ease with ADL's/IADLS's (All LTG's due 12/29/15)   Time 8   Period Weeks   Status New     OT LONG TERM GOAL #2   Title Pt will verbalize understanding of ways to keep thinking skills sharp, compensate for memory changes in the future, and increase awareness of PD complications/deficits   Time 8   Period Weeks   Status New     OT LONG TERM GOAL #3   Title Pt will hook and unhook 3 buttons for incr  ease with UE dressing in 24 sec. or less   Baseline eval = 33.32 sec   Time 8   Period Weeks   Status New     OT LONG TERM GOAL #4   Title Pt will perform PPT #4 in 18 sec. or less for increased ease with donning/doffing jacket   Baseline eval = 28.32 sec.    Time 8   Period Weeks   Status New     OT LONG TERM GOAL #5   Title TBD - appropriate standing functional reach goal prn    Time 8   Period Weeks   Status Deferred  16" bilaterally               Plan - 11/14/15 0905    Clinical Impression Statement Pt confused appointment times today. Pt appears WFL's for standing functional reach test bilaterally, therefore deferred LTG #5.    Rehab Potential Good   OT Frequency 2x / week   OT Duration 8 weeks   OT Treatment/Interventions Self-care/ADL training;Moist Heat;DME and/or AE instruction;Patient/family education;Therapeutic exercises;Therapeutic activities;Neuromuscular education;Functional Mobility Training;Passive range of motion;Manual Therapy;Cognitive remediation/compensation;Visual/perceptual remediation/compensation;Energy conservation   Plan Review PWR! seated, issue PWR! Hands, if time allows - issue PWR! supine or prone   OT Home Exercise Plan 11/14/15: PWR! Seated   Consulted and Agree with Plan of Care Patient      Patient will benefit from skilled therapeutic intervention in order to improve the following deficits and impairments:  Decreased coordination, Difficulty walking, Impaired flexibility, Improper body mechanics, Decreased endurance, Decreased safety awareness, Improper spinal/pelvic alignment, Decreased knowledge of precautions, Decreased activity tolerance, Impaired tone, Decreased balance, Decreased knowledge of use of DME, Impaired UE functional use, Decreased cognition, Decreased mobility, Impaired vision/preception, Impaired perceived functional ability  Visit Diagnosis: Other symptoms and signs involving the nervous system  Other symptoms and  signs involving the musculoskeletal system  Stiffness of right shoulder, not elsewhere classified  Stiffness of left shoulder, not elsewhere classified    Problem List Patient Active Problem List   Diagnosis Date Noted  . Parkinson disease [facility])     Carey Bullocks 11/14/2015, 9:09 AM  [facility] [address] [place], [name], [address] Phone: [phone]   Fax:  [phone]  Name: Ronald Noble MRN: [id number] Date of Birth: 1945/05/18

## 2015-11-18 ENCOUNTER — Ambulatory Visit: Payer: Medicare Other | Admitting: Occupational Therapy

## 2015-11-18 ENCOUNTER — Ambulatory Visit: Payer: Medicare Other

## 2015-11-18 DIAGNOSIS — M25611 Stiffness of right shoulder, not elsewhere classified: Secondary | ICD-10-CM | POA: Diagnosis not present

## 2015-11-18 DIAGNOSIS — R293 Abnormal posture: Secondary | ICD-10-CM | POA: Diagnosis not present

## 2015-11-18 DIAGNOSIS — R471 Dysarthria and anarthria: Secondary | ICD-10-CM

## 2015-11-18 DIAGNOSIS — M25612 Stiffness of left shoulder, not elsewhere classified: Secondary | ICD-10-CM | POA: Diagnosis not present

## 2015-11-18 DIAGNOSIS — R278 Other lack of coordination: Secondary | ICD-10-CM | POA: Diagnosis not present

## 2015-11-18 DIAGNOSIS — R29898 Other symptoms and signs involving the musculoskeletal system: Secondary | ICD-10-CM

## 2015-11-18 DIAGNOSIS — R29818 Other symptoms and signs involving the nervous system: Secondary | ICD-10-CM | POA: Diagnosis not present

## 2015-11-18 NOTE — Therapy (Signed)
[facility] [address] [place], [name], [address] Phone: [phone]   Fax:  [phone]  Speech Language Pathology Treatment  Patient Details  Name: Ronald Noble MRN: [id number] Date of Birth: [birth year] Referring Provider: Dr. Star Age  Encounter Date: 11/18/2015      End of Session - 11/18/15 0933    Visit Number 3   Number of Visits 17   Date for SLP Re-Evaluation 12/24/15   SLP Start Time 0850   SLP Stop Time  0931   SLP Time Calculation (min) 41 min      Past Medical History:  Diagnosis Date  . BPH (benign prostatic hyperplasia)   . Parkinson disease ([place])   . Skin cancer of face    arms and back  . Sleep apnea    mild  . Spider bite    brown recluse/ left ankle/08/13    Past Surgical History:  Procedure Laterality Date  . BURR HOLE W/ STEREOTACTIC INSERTION OF DBS LEADS / INTRAOP MICROELECTRODE RECORDING    . COLONOSCOPY    . KNEE SURGERY     exploratory/left  . SKIN CANCER EXCISION  01/2014   face  . TRANSURETHRAL RESECTION OF PROSTATE     2008    There were no vitals filed for this visit.      Subjective Assessment - 11/18/15 0841    Subjective "Just finished with my physical, vocational - rehab."    Currently in Pain? No/denies  "Ronald Noble worked it out"               ADULT SLP TREATMENT - 11/18/15 0850      General Information   Behavior/Cognition Alert;Cooperative;Pleasant mood     Treatment Provided   Treatment provided Cognitive-Linquistic     Cognitive-Linquistic Treatment   Treatment focused on Dysarthria   Skilled Treatment SLP reiterated loud /a/ is important for pt to recalibrate conversational loudness, and that it was necessary to complete at home as prescribed. Average was 96dB. In sentence tasks, pt req'd consistent min-mod A to remain above 70dB consistently. SLP was unable to fade cues with same success. Pt did not exhibit WNL loudness in  off-task/between task conversation..     Assessment / Recommendations / Plan   Plan Continue with current plan of care     Progression Toward Goals   Progression toward goals Progressing toward goals          SLP Education - 11/18/15 0933    Education provided Yes   Education Details need for consistency in ST work at home   Person(s) Educated Patient   Methods Explanation   Comprehension Verbalized understanding          SLP Short Term Goals - 11/18/15 0935      SLP SHORT TERM GOAL #1   Title Pt will average 93dB on loud /a/ over 3 sessions with rare min A   Time 3   Period Weeks   Status On-going     SLP SHORT TERM GOAL #2   Title Pt will average 72dB on structured speech tasks with rare min A   Time 3   Period Weeks   Status On-going     SLP SHORT TERM GOAL #3   Title Pt will maintain 70dB over 8 minute simple conversation with occasional min A   Time 3   Period Weeks   Status On-going          SLP Long Term Goals -  11/18/15 0936      SLP LONG TERM GOAL #1   Title Pt will maintain 70dB over 15 minute conversation with rare min A   Time 7   Period Weeks   Status On-going     SLP LONG TERM GOAL #2   Title Pt will maintain audible/intelligible speech in noisy environment outside of tx room over 12 minutes with rare min A   Time 7   Period Weeks   Status On-going          Plan - 11/18/15 0934    Clinical Impression Statement Ronald Noble continues requiring skilled ST for reduced speaking volume and rushes of speech which diminish speech intelligbility. Pt's speech in structured speech tasks was WNL with consistent A, but did not carry over into conversation outside of therapy tasks.   Speech Therapy Frequency 2x / week   Duration --  7 weeks   Treatment/Interventions Compensatory strategies;Oral motor exercises;Functional tasks;Multimodal communcation approach;Patient/family education;SLP instruction and feedback;Internal/external aids   Potential  to Achieve Goals Good      Patient will benefit from skilled therapeutic intervention in order to improve the following deficits and impairments:   Dysarthria and anarthria    Problem List Patient Active Problem List   Diagnosis Date Noted  . Parkinson disease ([place])     [place] ,[place], [place]  11/18/2015, 9:37 AM  [facility] [address] [place], [name], [address] Phone: [phone]   Fax:  [phone]   Name: Ronald Noble MRN: [id number] Date of Birth: 1945/06/07

## 2015-11-18 NOTE — Patient Instructions (Signed)
  Please complete the assigned speech therapy homework and return it to your next session.  

## 2015-11-18 NOTE — Therapy (Signed)
[facility] [address] [place], [name], [address] Phone: [phone]   Fax:  [phone]  Occupational Therapy Treatment  Patient Details  Name: Ronald Noble MRN: [id number] Date of Birth: January 24, 1946 Referring Provider: Dr. Star Age  Encounter Date: 11/18/2015      OT End of Session - 11/18/15 1548    Visit Number 3   Number of Visits 17   Date for OT Re-Evaluation 12/29/15   Authorization Type MCR 1*, BC/BS 2* - G Code needed   Authorization - Visit Number 3   Authorization - Number of Visits 10   OT Start Time 0805   OT Stop Time 0845   OT Time Calculation (min) 40 min   Activity Tolerance Patient tolerated treatment well   Behavior During Therapy [facility] for tasks assessed/performed      Past Medical History:  Diagnosis Date  . BPH (benign prostatic hyperplasia)   . Parkinson disease ([place])   . Skin cancer of face    arms and back  . Sleep apnea    mild  . Spider bite    brown recluse/ left ankle/08/13    Past Surgical History:  Procedure Laterality Date  . BURR HOLE W/ STEREOTACTIC INSERTION OF DBS LEADS / INTRAOP MICROELECTRODE RECORDING    . COLONOSCOPY    . KNEE SURGERY     exploratory/left  . SKIN CANCER EXCISION  01/2014   face  . TRANSURETHRAL RESECTION OF PROSTATE     2008    There were no vitals filed for this visit.      Subjective Assessment - 11/18/15 0807    Subjective  Feel out of bed with active dreams Friday night/Saturday am   Pertinent History PD since 2008   Limitations DBS, falls, orthostatic hypotension   Patient Stated Goals increase handwriting   Currently in Pain? Yes   Pain Score 1    Pain Location Shoulder   Pain Orientation Left;Right   Pain Descriptors / Indicators Aching;Sore   Pain Type Acute pain   Pain Onset In the past 7 days   Aggravating Factors  lifting    Pain Relieving Factors rest            Neuro re-ed:  PWR! Moves (basic  4) in sitting x 10-120 each with min cues For incr movement amplitude/performance.   PWR! Moves (basic 4) in supine x10-20 each with min cueing for incr movement amplitude/performance.  Pt with incr difficulty with wt. Shift to R side.      Reviewed normal movement patterns for supine>sit to prevent future complications, pt returned demo after instruction/min v.c.    Recommended frequent stretching (PWR! Up/PWR! Hands) when working at the computer.  Pt verbalized understanding.              OT Education - 11/18/15 1317    Education Details PWR! moves in supine (basic 4); Recommended bed rail due to falling out of bed (with REM sleep disorder)   Person(s) Educated Patient   Methods Explanation;Demonstration;Handout   Comprehension Returned demonstration;Verbalized understanding;Verbal cues required          OT Short Term Goals - 10/29/15 1451      OT SHORT TERM GOAL #1   Title Pt will be independent with PD specific HEP (All STG's due 11/29/15)    Time 4   Period Weeks   Status New     OT SHORT TERM GOAL #2   Title Pt will write at  least 3 sentences with no significant decr in size maintaining legibility   Time 4   Period Weeks   Status New     OT SHORT TERM GOAL #3   Title Pt will verbalize understanding of ways to prevent future PD related complications and appropriate community resources prn   Time 4   Period Weeks   Status New     OT SHORT TERM GOAL #4   Title Pt to hook/unhook 3 buttons for UE dressing in 28 sec. or less   Baseline eval = 33.32 sec   Time 4   Period Weeks   Status New     OT SHORT TERM GOAL #5   Title Pt will perform PPT #4 in 23 sec. or less for donning/doffing jacket   Baseline eval = 28.32 sec.    Time 4   Period Weeks   Status New           OT Long Term Goals - 11/14/15 0908      OT LONG TERM GOAL #1   Title Pt will verbalize understanding of adaptive strategies to incr ease with ADL's/IADLS's (All LTG's due 12/29/15)    Time 8   Period Weeks   Status New     OT LONG TERM GOAL #2   Title Pt will verbalize understanding of ways to keep thinking skills sharp, compensate for memory changes in the future, and increase awareness of PD complications/deficits   Time 8   Period Weeks   Status New     OT LONG TERM GOAL #3   Title Pt will hook and unhook 3 buttons for incr ease with UE dressing in 24 sec. or less   Baseline eval = 33.32 sec   Time 8   Period Weeks   Status New     OT LONG TERM GOAL #4   Title Pt will perform PPT #4 in 18 sec. or less for increased ease with donning/doffing jacket   Baseline eval = 28.32 sec.    Time 8   Period Weeks   Status New     OT LONG TERM GOAL #5   Title TBD - appropriate standing functional reach goal prn    Time 8   Period Weeks   Status Deferred  16" bilaterally               Plan - 11/18/15 1549    Clinical Impression Statement Pt was able to demo sitting PWR! moves with min cueing today.  Pt reports shoulder soreness due to fall (while asleep), but did not interfere with activity today.   Rehab Potential Good   OT Frequency 2x / week   OT Duration 8 weeks   OT Treatment/Interventions Self-care/ADL training;Moist Heat;DME and/or AE instruction;Patient/family education;Therapeutic exercises;Therapeutic activities;Neuromuscular education;Functional Mobility Training;Passive range of motion;Manual Therapy;Cognitive remediation/compensation;Visual/perceptual remediation/compensation;Energy conservation   Plan PWR! hands, coordination HEP   OT Home Exercise Plan 11/14/15: PWR! Seated   Consulted and Agree with Plan of Care Patient      Patient will benefit from skilled therapeutic intervention in order to improve the following deficits and impairments:  Decreased coordination, Difficulty walking, Impaired flexibility, Improper body mechanics, Decreased endurance, Decreased safety awareness, Improper spinal/pelvic alignment, Decreased knowledge of  precautions, Decreased activity tolerance, Impaired tone, Decreased balance, Decreased knowledge of use of DME, Impaired UE functional use, Decreased cognition, Decreased mobility, Impaired vision/preception, Impaired perceived functional ability  Visit Diagnosis: Other symptoms and signs involving the musculoskeletal system  Other symptoms and signs  involving the nervous system  Stiffness of right shoulder, not elsewhere classified    Problem List Patient Active Problem List   Diagnosis Date Noted  . Parkinson disease [facility])     [facility] 11/18/2015, 4:50 PM  [facility] [address] [place], [name], [address] Phone: [phone]   Fax:  [phone]  Name: Ronald Noble MRN: [id number] Date of Birth: 1945-12-10   Vianne Bulls, OTR/L [facility] [address] [place], [place]  [address] [phone] phone [phone] 11/18/15 4:50 PM

## 2015-11-19 ENCOUNTER — Ambulatory Visit: Payer: Medicare Other

## 2015-11-19 ENCOUNTER — Ambulatory Visit: Payer: Medicare Other | Admitting: Occupational Therapy

## 2015-11-19 DIAGNOSIS — R278 Other lack of coordination: Secondary | ICD-10-CM

## 2015-11-19 DIAGNOSIS — M25612 Stiffness of left shoulder, not elsewhere classified: Secondary | ICD-10-CM | POA: Diagnosis not present

## 2015-11-19 DIAGNOSIS — M25611 Stiffness of right shoulder, not elsewhere classified: Secondary | ICD-10-CM

## 2015-11-19 DIAGNOSIS — R29818 Other symptoms and signs involving the nervous system: Secondary | ICD-10-CM | POA: Diagnosis not present

## 2015-11-19 DIAGNOSIS — R29898 Other symptoms and signs involving the musculoskeletal system: Secondary | ICD-10-CM

## 2015-11-19 DIAGNOSIS — R293 Abnormal posture: Secondary | ICD-10-CM

## 2015-11-19 DIAGNOSIS — R471 Dysarthria and anarthria: Secondary | ICD-10-CM

## 2015-11-19 DIAGNOSIS — R2681 Unsteadiness on feet: Secondary | ICD-10-CM

## 2015-11-19 NOTE — Therapy (Signed)
[facility] [address] [place], [name], [address] Phone: [phone]   Fax:  [phone]  Occupational Therapy Treatment  Patient Details  Name: Ronald Noble MRN: [id number] Date of Birth: Jan 11, 1946 Referring Provider: Dr. Star Age  Encounter Date: 11/19/2015      OT End of Session - 11/19/15 0817    Visit Number 4   Number of Visits 17   Date for OT Re-Evaluation 12/29/15   Authorization Type MCR 1*, BC/BS 2* - G Code needed   Authorization - Visit Number 4   Authorization - Number of Visits 10   OT Start Time 0804   OT Stop Time 0845   OT Time Calculation (min) 41 min   Activity Tolerance Patient tolerated treatment well   Behavior During Therapy [facility] for tasks assessed/performed      Past Medical History:  Diagnosis Date  . BPH (benign prostatic hyperplasia)   . Parkinson disease ([place])   . Skin cancer of face    arms and back  . Sleep apnea    mild  . Spider bite    brown recluse/ left ankle/08/13    Past Surgical History:  Procedure Laterality Date  . BURR HOLE W/ STEREOTACTIC INSERTION OF DBS LEADS / INTRAOP MICROELECTRODE RECORDING    . COLONOSCOPY    . KNEE SURGERY     exploratory/left  . SKIN CANCER EXCISION  01/2014   face  . TRANSURETHRAL RESECTION OF PROSTATE     2008    There were no vitals filed for this visit.      Subjective Assessment - 11/18/15 0807    Subjective  Feel out of bed with active dreams Friday night/Saturday am   Pertinent History PD since 2008   Limitations DBS, falls, orthostatic hypotension   Patient Stated Goals increase handwriting   Currently in Pain? Yes   Pain Score 1    Pain Location Shoulder   Pain Orientation Left;Right   Pain Descriptors / Indicators Aching;Sore   Pain Type Acute pain   Pain Onset In the past 7 days   Aggravating Factors  lifting    Pain Relieving Factors rest         Neuro re-ed:  PWR! Moves (basic 4) in  supine x 10-20 each with min cues For incr movement amplitude.  PWR! Hands (basic 4) with min-mod cueing for incr movement amplitude x10each.  Recommended pt put towel behind lower back in sitting to help facilitate posture and educated pt regarding his tendency to lean to the R.  Recommended pt take frequent breaks when sitting and PWR! Up (posture) as well as taking frequent breaks to PWR! Up hands.  Pt educated on sensory/awareness of movement changes with PD and use of large amplitude movements and feedback to improve.  Pt verbalized understanding.  Flipping cards with each hand with min-mod cueing for use of large amplitude movements (finger/wrist ext)  Min cueing given for shoulder hike bilaterally throughout session, pt responded well to cueing.                        OT Education - 11/19/15 0835    Education Details PWR! hands (basic 4), Initiated coordination HEP (wih emphasis on large amplitude movements)--see pt instructions   Person(s) Educated Patient   Methods Explanation;Handout;Demonstration;Verbal cues   Comprehension Verbalized understanding;Returned demonstration;Verbal cues required          OT Short Term Goals - 10/29/15 1451  OT SHORT TERM GOAL #1   Title Pt will be independent with PD specific HEP (All STG's due 11/29/15)    Time 4   Period Weeks   Status New     OT SHORT TERM GOAL #2   Title Pt will write at least 3 sentences with no significant decr in size maintaining legibility   Time 4   Period Weeks   Status New     OT SHORT TERM GOAL #3   Title Pt will verbalize understanding of ways to prevent future PD related complications and appropriate community resources prn   Time 4   Period Weeks   Status New     OT SHORT TERM GOAL #4   Title Pt to hook/unhook 3 buttons for UE dressing in 28 sec. or less   Baseline eval = 33.32 sec   Time 4   Period Weeks   Status New     OT SHORT TERM GOAL #5   Title Pt will perform PPT #4  in 23 sec. or less for donning/doffing jacket   Baseline eval = 28.32 sec.    Time 4   Period Weeks   Status New           OT Long Term Goals - 11/14/15 0908      OT LONG TERM GOAL #1   Title Pt will verbalize understanding of adaptive strategies to incr ease with ADL's/IADLS's (All LTG's due 12/29/15)   Time 8   Period Weeks   Status New     OT LONG TERM GOAL #2   Title Pt will verbalize understanding of ways to keep thinking skills sharp, compensate for memory changes in the future, and increase awareness of PD complications/deficits   Time 8   Period Weeks   Status New     OT LONG TERM GOAL #3   Title Pt will hook and unhook 3 buttons for incr ease with UE dressing in 24 sec. or less   Baseline eval = 33.32 sec   Time 8   Period Weeks   Status New     OT LONG TERM GOAL #4   Title Pt will perform PPT #4 in 18 sec. or less for increased ease with donning/doffing jacket   Baseline eval = 28.32 sec.    Time 8   Period Weeks   Status New     OT LONG TERM GOAL #5   Title TBD - appropriate standing functional reach goal prn    Time 8   Period Weeks   Status Deferred  16" bilaterally               Plan - 11/18/15 1549    Clinical Impression Statement Pt was able to demo sitting PWR! moves with min cueing today.  Pt reports shoulder soreness due to fall (while asleep), but did not interfere with activity today.   Rehab Potential Good   OT Frequency 2x / week   OT Duration 8 weeks   OT Treatment/Interventions Self-care/ADL training;Moist Heat;DME and/or AE instruction;Patient/family education;Therapeutic exercises;Therapeutic activities;Neuromuscular education;Functional Mobility Training;Passive range of motion;Manual Therapy;Cognitive remediation/compensation;Visual/perceptual remediation/compensation;Energy conservation   Plan PWR! hands, coordination HEP   OT Home Exercise Plan 11/14/15: PWR! Seated   Consulted and Agree with Plan of Care Patient       Patient will benefit from skilled therapeutic intervention in order to improve the following deficits and impairments:     Visit Diagnosis: Other symptoms and signs involving the musculoskeletal system  Other  symptoms and signs involving the nervous system  Stiffness of right shoulder, not elsewhere classified  Stiffness of left shoulder, not elsewhere classified  Other lack of coordination  Abnormal posture  Unsteadiness on feet    Problem List Patient Active Problem List   Diagnosis Date Noted  . Parkinson disease [facility])     [facility] 11/19/2015, 8:38 AM  [facility] [address] [place], [name], [address] Phone: [phone]   Fax:  [phone]  Name: Ronald Noble MRN: [id number] Date of Birth: 11/12/1945   Vianne Bulls, OTR/L [facility] [address] [place], [place]  [address] [phone] phone [phone] 11/19/15 8:38 AM

## 2015-11-19 NOTE — Patient Instructions (Addendum)

## 2015-11-19 NOTE — Therapy (Signed)
[facility] [address] [place], [name], [address] Phone: [phone]   Fax:  [phone]  Speech Language Pathology Treatment  Patient Details  Name: Ronald Noble MRN: [id number] Date of Birth: December 23, 1945 Referring Provider: Dr. Star Age  Encounter Date: 11/19/2015      End of Session - 11/19/15 1720    Visit Number 4   Number of Visits 17   Date for SLP Re-Evaluation 12/24/15   SLP Start Time 0851   SLP Stop Time  0932   SLP Time Calculation (min) 41 min   Activity Tolerance Patient tolerated treatment well      Past Medical History:  Diagnosis Date  . BPH (benign prostatic hyperplasia)   . Parkinson disease ([place])   . Skin cancer of face    arms and back  . Sleep apnea    mild  . Spider bite    brown recluse/ left ankle/08/13    Past Surgical History:  Procedure Laterality Date  . BURR HOLE W/ STEREOTACTIC INSERTION OF DBS LEADS / INTRAOP MICROELECTRODE RECORDING    . COLONOSCOPY    . KNEE SURGERY     exploratory/left  . SKIN CANCER EXCISION  01/2014   face  . TRANSURETHRAL RESECTION OF PROSTATE     2008    There were no vitals filed for this visit.      Subjective Assessment - 11/19/15 0901    Subjective "I DID my homework last night!"   Currently in Pain? No/denies               ADULT SLP TREATMENT - 11/19/15 0902      General Information   Behavior/Cognition Alert;Cooperative;Pleasant mood     Treatment Provided   Treatment provided Cognitive-Linquistic     Cognitive-Linquistic Treatment   Treatment focused on Dysarthria   Skilled Treatment For pt to recalibrate conversational loudness, SLP had pt produce 5 reps of loud /a/, average of which was 92dB. In sentence tasks, pt req'd rare min-mod A to remain above 70dB. In conversation between tasks or stimuli, there was minimal spontaneous carryover of louder speech. SLP made specific mention of this and pt's  frequency of WNL loudness improved to approx 25%.       Assessment / Recommendations / Plan   Plan Continue with current plan of care     Progression Toward Goals   Progression toward goals Progressing toward goals          SLP Education - 11/18/15 0933    Education provided Yes   Education Details need for consistency in ST work at home   Person(s) Educated Patient   Methods Explanation   Comprehension Verbalized understanding          SLP Short Term Goals - 11/19/15 1722      SLP SHORT TERM GOAL #1   Title Pt will average 93dB on loud /a/ over 3 sessions with rare min A   Baseline one session 11-19-15   Time 3   Period Weeks   Status On-going     SLP SHORT TERM GOAL #2   Title Pt will average 72dB on structured speech tasks with rare min A   Time 3   Period Weeks   Status On-going     SLP SHORT TERM GOAL #3   Title Pt will maintain 70dB over 8 minute simple conversation with occasional min A   Time 3   Period Weeks   Status On-going  SLP Long Term Goals - 11/19/15 1723      SLP LONG TERM GOAL #1   Title Pt will maintain 70dB over 15 minute conversation with rare min A   Time 7   Period Weeks   Status On-going     SLP LONG TERM GOAL #2   Title Pt will maintain audible/intelligible speech in noisy environment outside of tx room over 12 minutes with rare min A   Time 7   Period Weeks   Status On-going          Plan - 11/19/15 1721    Clinical Impression Statement Mr. Casias continues requiring skilled ST for reduced speaking volume and rushes of speech which diminish speech intelligbility. Pt's speech in structured speech tasks was WNL with SLP A, but did not carry over into conversation outside of therapy tasks until SLP said something directly, and then it incr'd to approx 25%.    Speech Therapy Frequency 2x / week   Duration --  7 weeks   Treatment/Interventions Compensatory strategies;Oral motor exercises;Functional tasks;Multimodal  communcation approach;Patient/family education;SLP instruction and feedback;Internal/external aids   Potential to Achieve Goals Good      Patient will benefit from skilled therapeutic intervention in order to improve the following deficits and impairments:   Dysarthria and anarthria    Problem List Patient Active Problem List   Diagnosis Date Noted  . Parkinson disease ([place])     [facility] [place], [place]  11/19/2015, 5:23 PM  [facility] [address] [place], [name], [address] Phone: [phone]   Fax:  [phone]   Name: Ronald Noble MRN: [id number] Date of Birth: [birth year]

## 2015-11-19 NOTE — Patient Instructions (Signed)
  Please complete the assigned speech therapy homework and return it to your next session.  

## 2015-11-25 ENCOUNTER — Ambulatory Visit: Payer: Medicare Other

## 2015-11-25 ENCOUNTER — Ambulatory Visit: Payer: Medicare Other | Admitting: Occupational Therapy

## 2015-11-25 DIAGNOSIS — R293 Abnormal posture: Secondary | ICD-10-CM

## 2015-11-25 DIAGNOSIS — R278 Other lack of coordination: Secondary | ICD-10-CM | POA: Diagnosis not present

## 2015-11-25 DIAGNOSIS — M25611 Stiffness of right shoulder, not elsewhere classified: Secondary | ICD-10-CM

## 2015-11-25 DIAGNOSIS — R471 Dysarthria and anarthria: Secondary | ICD-10-CM

## 2015-11-25 DIAGNOSIS — M25612 Stiffness of left shoulder, not elsewhere classified: Secondary | ICD-10-CM

## 2015-11-25 DIAGNOSIS — R29898 Other symptoms and signs involving the musculoskeletal system: Secondary | ICD-10-CM | POA: Diagnosis not present

## 2015-11-25 DIAGNOSIS — R29818 Other symptoms and signs involving the nervous system: Secondary | ICD-10-CM | POA: Diagnosis not present

## 2015-11-25 NOTE — Patient Instructions (Addendum)
Coordination Exercises  Perform the following exercises for 20 minutes 1 times per day. Perform with both hand(s). Perform using big movements.   Flipping Cards: Place deck of cards on the table. Flip cards over by opening your hand big to grasp and then turn your palm up big, opening hand fully to release.  Deal cards: Hold 1/2 or whole deck in your hand. Use thumb to push card off top of deck with one big push.  Place card on tabletop. Then flick fingers (extend fingers) powerfully to slide card off table (can have chair/box below table to catch the cards).  Rotate ball with fingertips: Pick up with fingers/thumb and move as much as you can with each turn/movement (clockwise and counter-clockwise).

## 2015-11-25 NOTE — Therapy (Signed)
[facility] [address] [place], [name], [address] Phone: [phone]   Fax:  [phone]  Occupational Therapy Treatment  Patient Details  Name: Ronald Noble MRN: [id number] Date of Birth: 1946/01/23 Referring Provider: Dr. Star Age  Encounter Date: 11/25/2015      OT End of Session - 11/25/15 0811    Visit Number 5   Number of Visits 17   Date for OT Re-Evaluation 12/29/15   Authorization Type MCR 1*, BC/BS 2* - G Code needed   Authorization - Visit Number 5   Authorization - Number of Visits 10   OT Start Time 0802   OT Stop Time 0845   OT Time Calculation (min) 43 min   Activity Tolerance Patient tolerated treatment well   Behavior During Therapy [facility] for tasks assessed/performed      Past Medical History:  Diagnosis Date  . BPH (benign prostatic hyperplasia)   . Parkinson disease ([place])   . Skin cancer of face    arms and back  . Sleep apnea    mild  . Spider bite    brown recluse/ left ankle/08/13    Past Surgical History:  Procedure Laterality Date  . BURR HOLE W/ STEREOTACTIC INSERTION OF DBS LEADS / INTRAOP MICROELECTRODE RECORDING    . COLONOSCOPY    . KNEE SURGERY     exploratory/left  . SKIN CANCER EXCISION  01/2014   face  . TRANSURETHRAL RESECTION OF PROSTATE     2008    There were no vitals filed for this visit.      Subjective Assessment - 11/25/15 0809    Subjective  Overdid it with my shoulders trimming hedges "It hurt last night"  "The hand exercises really work, my hands feel a lot looser"   Pertinent History PD since 2008   Limitations DBS, falls, orthostatic hypotension   Patient Stated Goals increase handwriting   Currently in Pain? No/denies   Pain Onset In the past 7 days         Neuro re-ed:  PWR! Moves (basic 4) in supine x 10-20 each with min cues For occasional incr movement amplitude.   PWR! Hands (basic 4)  x 5-10 each with min cues For  incr movement amplitude (R elbow ext, supination).    Min cueing throughout session for shoulder hike/compensation (dystonia positioning)   No pain reported during therapy today.  Pt reports mild discomfort at rest.                   OT Education - 11/25/15 0816    Education Details coordination HEP with focus on large amplitude movements   Person(s) Educated Patient   Methods Explanation;Demonstration;Verbal cues   Comprehension Verbalized understanding;Returned demonstration          OT Short Term Goals - 10/29/15 1451      OT SHORT TERM GOAL #1   Title Pt will be independent with PD specific HEP (All STG's due 11/29/15)    Time 4   Period Weeks   Status New     OT SHORT TERM GOAL #2   Title Pt will write at least 3 sentences with no significant decr in size maintaining legibility   Time 4   Period Weeks   Status New     OT SHORT TERM GOAL #3   Title Pt will verbalize understanding of ways to prevent future PD related complications and appropriate community resources prn   Time 4  Period Weeks   Status New     OT SHORT TERM GOAL #4   Title Pt to hook/unhook 3 buttons for UE dressing in 28 sec. or less   Baseline eval = 33.32 sec   Time 4   Period Weeks   Status New     OT SHORT TERM GOAL #5   Title Pt will perform PPT #4 in 23 sec. or less for donning/doffing jacket   Baseline eval = 28.32 sec.    Time 4   Period Weeks   Status New           OT Long Term Goals - 11/14/15 0908      OT LONG TERM GOAL #1   Title Pt will verbalize understanding of adaptive strategies to incr ease with ADL's/IADLS's (All LTG's due 12/29/15)   Time 8   Period Weeks   Status New     OT LONG TERM GOAL #2   Title Pt will verbalize understanding of ways to keep thinking skills sharp, compensate for memory changes in the future, and increase awareness of PD complications/deficits   Time 8   Period Weeks   Status New     OT LONG TERM GOAL #3   Title Pt will  hook and unhook 3 buttons for incr ease with UE dressing in 24 sec. or less   Baseline eval = 33.32 sec   Time 8   Period Weeks   Status New     OT LONG TERM GOAL #4   Title Pt will perform PPT #4 in 18 sec. or less for increased ease with donning/doffing jacket   Baseline eval = 28.32 sec.    Time 8   Period Weeks   Status New     OT LONG TERM GOAL #5   Title TBD - appropriate standing functional reach goal prn    Time 8   Period Weeks   Status Deferred  16" bilaterally               Plan - 11/25/15 0811    Clinical Impression Statement Pt reports decr rigidy with HEP and is progressing towards goals.  Pt demo incr movement amplitude with PWR! supine with decr cueing.  Pt demo dystonia posturing/compensation with fine motor tasks and needs cueing to correct due to decr awareness.   Rehab Potential Good   OT Frequency 2x / week   OT Duration 8 weeks   OT Treatment/Interventions Self-care/ADL training;Moist Heat;DME and/or AE instruction;Patient/family education;Therapeutic exercises;Therapeutic activities;Neuromuscular education;Functional Mobility Training;Passive range of motion;Manual Therapy;Cognitive remediation/compensation;Visual/perceptual remediation/compensation;Energy conservation   Plan PWR! quadraped, review/add to coordination HEP   OT Home Exercise Plan 11/14/15: PWR! Seated; PWR! supine; 11/19/15  PWR! hands; 11/25/15 coordination HEP   Consulted and Agree with Plan of Care Patient      Patient will benefit from skilled therapeutic intervention in order to improve the following deficits and impairments:  Decreased coordination, Difficulty walking, Impaired flexibility, Improper body mechanics, Decreased endurance, Decreased safety awareness, Improper spinal/pelvic alignment, Decreased knowledge of precautions, Decreased activity tolerance, Impaired tone, Decreased balance, Decreased knowledge of use of DME, Impaired UE functional use, Decreased cognition,  Decreased mobility, Impaired vision/preception, Impaired perceived functional ability  Visit Diagnosis: Other symptoms and signs involving the musculoskeletal system  Other symptoms and signs involving the nervous system  Stiffness of right shoulder, not elsewhere classified  Stiffness of left shoulder, not elsewhere classified  Other lack of coordination  Abnormal posture    Problem List Patient Active  Problem List   Diagnosis Date Noted  . Parkinson disease ([place])     [facility] 11/25/2015, 1:10 PM  [facility] [address] [place], [name], [address] Phone: [phone]   Fax:  [phone]  Name: Demba Hullender MRN: [id number] Date of Birth: 07/01/1945   Vianne Bulls, OTR/L [facility] [address] [place], [place]  [address] [phone] phone [phone] 11/25/15 1:10 PM

## 2015-11-25 NOTE — Therapy (Signed)
[facility] [address] [place], [name], [address] Phone: [phone]   Fax:  [phone]  Speech Language Pathology Treatment  Patient Details  Name: Ronald Noble MRN: [id number] Date of Birth: [birth year] Referring Provider: Dr. Star Age  Encounter Date: 11/25/2015      End of Session - 11/25/15 1014    Visit Number 5   Number of Visits 17   Date for SLP Re-Evaluation 12/24/15   SLP Start Time 0850   SLP Stop Time  0931   SLP Time Calculation (min) 41 min   Activity Tolerance Patient tolerated treatment well      Past Medical History:  Diagnosis Date  . BPH (benign prostatic hyperplasia)   . Parkinson disease ([place])   . Skin cancer of face    arms and back  . Sleep apnea    mild  . Spider bite    brown recluse/ left ankle/08/13    Past Surgical History:  Procedure Laterality Date  . BURR HOLE W/ STEREOTACTIC INSERTION OF DBS LEADS / INTRAOP MICROELECTRODE RECORDING    . COLONOSCOPY    . KNEE SURGERY     exploratory/left  . SKIN CANCER EXCISION  01/2014   face  . TRANSURETHRAL RESECTION OF PROSTATE     2008    There were no vitals filed for this visit.      Subjective Assessment - 11/25/15 0910    Subjective Pt used chain saw yesterday - SLP questioned pt's safety with chain saw.               ADULT SLP TREATMENT - 11/25/15 0911      General Information   Behavior/Cognition Alert;Cooperative;Pleasant mood     Treatment Provided   Treatment provided Cognitive-Linquistic     Pain Assessment   Pain Assessment No/denies pain     Cognitive-Linquistic Treatment   Treatment focused on Dysarthria   Skilled Treatment SLP engaged pt in 15 minutes mod complex conversation re: yardwork yesterday with average 69dB with occasional verbal cues.  Pt repeatedly told SLP he felt like he was shouting so SLP used digital recorder in conversation and played back to pt with pt responding  "I'm not shouting at all. It sounds normal." SLP spent remainder of session reiterating/re-educating pt about "recalibration" aspect of loud /a/, as his speech was WNL loudness for 2 minutes after his loud /a/ (at average 94dB). Pt using reading aloud as homework and SLP educated him that is a good idea for warm up after loud /a/ but sticking to spontaneous speech is a better task at this time.      Assessment / Recommendations / Plan   Plan Continue with current plan of care     Progression Toward Goals   Progression toward goals Progressing toward goals          SLP Education - 11/25/15 1013    Education provided Yes   Education Details loudness recalibration, need to do loud /a/ daily, reading as "warm up" and not practice   Person(s) Educated Patient   Methods Explanation   Comprehension Verbalized understanding          SLP Short Term Goals - 11/25/15 1017      SLP SHORT TERM GOAL #1   Title Pt will average 93dB on loud /a/ over 3 sessions with rare min A   Baseline two sessions 11-25-15   Time 2   Period Weeks   Status On-going  SLP SHORT TERM GOAL #2   Title Pt will average 72dB on structured speech tasks with rare min A   Time 2   Period Weeks   Status On-going     SLP SHORT TERM GOAL #3   Title Pt will maintain 70dB over 8 minute simple conversation with occasional min A   Time 2   Period Weeks   Status On-going          SLP Long Term Goals - 11/25/15 1018      SLP LONG TERM GOAL #1   Title Pt will maintain 70dB over 15 minute conversation with rare min A   Time 6   Period Weeks   Status On-going     SLP LONG TERM GOAL #2   Title Pt will maintain audible/intelligible speech in noisy environment outside of tx room over 12 minutes with rare min A   Time 6   Period Weeks   Status On-going          Plan - 11/25/15 1014    Clinical Impression Statement Pt continues requiring skilled ST for reduced speaking volume and rushes of speech which  diminish speech intelligbility. Pt's speech in simple conversation was WNL with occasional SLP A. SLP spent some of today's session reiterating/reeducating pt on reason for loud /a/, and practice, and that his WNL loudness speech was not shouting, using a digital recorder. Pt would cont to benefit from skilled ST to recalibrate his loudness to WNL in spontaneous and more complex linguistic situations.    Speech Therapy Frequency 2x / week   Duration --  6 weeks   Treatment/Interventions Compensatory strategies;Oral motor exercises;Functional tasks;Multimodal communcation approach;Patient/family education;SLP instruction and feedback;Internal/external aids   Potential to Achieve Goals Good      Patient will benefit from skilled therapeutic intervention in order to improve the following deficits and impairments:   Dysarthria and anarthria    Problem List Patient Active Problem List   Diagnosis Date Noted  . Parkinson disease ([place])     [facility] [place], [place]  11/25/2015, 10:18 AM  [facility] [address] [place], [name], [address] Phone: [phone]   Fax:  [phone]   Name: Ronald Noble MRN: [id number] Date of Birth: Jun 13, 1945

## 2015-11-28 ENCOUNTER — Ambulatory Visit: Payer: Medicare Other | Admitting: Occupational Therapy

## 2015-11-28 ENCOUNTER — Ambulatory Visit: Payer: Medicare Other

## 2015-11-28 DIAGNOSIS — R29818 Other symptoms and signs involving the nervous system: Secondary | ICD-10-CM

## 2015-11-28 DIAGNOSIS — R2681 Unsteadiness on feet: Secondary | ICD-10-CM

## 2015-11-28 DIAGNOSIS — M25612 Stiffness of left shoulder, not elsewhere classified: Secondary | ICD-10-CM

## 2015-11-28 DIAGNOSIS — R278 Other lack of coordination: Secondary | ICD-10-CM

## 2015-11-28 DIAGNOSIS — R293 Abnormal posture: Secondary | ICD-10-CM | POA: Diagnosis not present

## 2015-11-28 DIAGNOSIS — R29898 Other symptoms and signs involving the musculoskeletal system: Secondary | ICD-10-CM

## 2015-11-28 DIAGNOSIS — M25611 Stiffness of right shoulder, not elsewhere classified: Secondary | ICD-10-CM

## 2015-11-28 DIAGNOSIS — R471 Dysarthria and anarthria: Secondary | ICD-10-CM

## 2015-11-28 DIAGNOSIS — R2689 Other abnormalities of gait and mobility: Secondary | ICD-10-CM

## 2015-11-28 NOTE — Patient Instructions (Addendum)
Coordination Exercises  Perform the following exercises for 20 minutes 1 times per day. Perform with both hand(s). Perform using big movements.   Flipping Cards: Place deck of cards on the table. Flip cards over by opening your hand big to grasp and then turn your palm up big, opening hand fully to release.  Deal cards: Hold 1/2 or whole deck in your hand. Use thumb to push card off top of deck with one big push.  Place card on tabletop. Then flick fingers (extend fingers) powerfully to slide card off table (can have chair/box below table to catch the cards).  Rotate ball with fingertips: Pick up with fingers/thumb and move as much as you can with each turn/movement (clockwise and counter-clockwise).   Pick up coins and stack one at a time: Pick up with big, intentional movements, open hand before picking up. Do not drag coin to the edge. (5-10 in a stack)  Pick up 5-10 coins one at a time and hold in palm. Then, move coins from palm to fingertips one at time and place in coin bank/container.

## 2015-11-28 NOTE — Therapy (Signed)
[facility] [address] [place], [name], [address] Phone: [phone]   Fax:  [phone]  Speech Language Pathology Treatment  Patient Details  Name: Ronald Noble MRN: [id number] Date of Birth: 01-24-1946 Referring Provider: Dr. Star Age  Encounter Date: 11/28/2015      End of Session - 11/28/15 0846    Visit Number 6   Number of Visits 17   Date for SLP Re-Evaluation 12/24/15   SLP Start Time 0804   SLP Stop Time  0846   SLP Time Calculation (min) 42 min   Activity Tolerance Patient tolerated treatment well      Past Medical History:  Diagnosis Date  . BPH (benign prostatic hyperplasia)   . Parkinson disease ([place])   . Skin cancer of face    arms and back  . Sleep apnea    mild  . Spider bite    brown recluse/ left ankle/08/13    Past Surgical History:  Procedure Laterality Date  . BURR HOLE W/ STEREOTACTIC INSERTION OF DBS LEADS / INTRAOP MICROELECTRODE RECORDING    . COLONOSCOPY    . KNEE SURGERY     exploratory/left  . SKIN CANCER EXCISION  01/2014   face  . TRANSURETHRAL RESECTION OF PROSTATE     2008    There were no vitals filed for this visit.      Subjective Assessment - 11/28/15 0810    Subjective Pt arrived with conversation at [place].   Currently in Pain? Yes   Pain Score 2    Pain Location Scapula   Pain Orientation Left;Right   Pain Descriptors / Indicators Aching;Sore   Pain Type Acute pain   Pain Onset 1 to 4 weeks ago   Pain Frequency Intermittent   Aggravating Factors  "certain positions"   Pain Relieving Factors resting               ADULT SLP TREATMENT - 11/28/15 0812      General Information   Behavior/Cognition Alert;Cooperative;Pleasant mood     Treatment Provided   Treatment provided Cognitive-Linquistic     Cognitive-Linquistic Treatment   Treatment focused on Dysarthria   Skilled Treatment Pt reports he read out loud more than he did  the prescribed homework so SLP educated pt re: why spontaneous speech is better than out loud reading. SLP used loud /a/ to recalibrate pt's conversational speech to WNL volume. Loudness average 94dB. In conversation between loud /a/'s, average was 72dB. In responses of multiple sentences (3-4), pt maintained 70dB. In conversation re: camping and more in-depth mod complex conversation pt maintained 69dB with rare min A.      Assessment / Recommendations / Plan   Plan Continue with current plan of care     Progression Toward Goals   Progression toward goals Progressing toward goals          SLP Education - 11/28/15 0846    Education provided Yes   Education Details reading for warm up only   Person(s) Educated Patient   Methods Explanation   Comprehension Verbalized understanding          SLP Short Term Goals - 11/28/15 0909      SLP SHORT TERM GOAL #1   Title Pt will average 93dB on loud /a/ over 3 sessions with rare min A   Status Achieved     SLP SHORT TERM GOAL #2   Title Pt will average 72dB on structured speech tasks with rare min  A   Time 2   Period Weeks   Status On-going     SLP SHORT TERM GOAL #3   Title Pt will maintain 70dB over 8 minute simple conversation with occasional min A   Time 2   Period Weeks   Status On-going          SLP Long Term Goals - 11/28/15 0910      SLP LONG TERM GOAL #1   Title Pt will maintain 70dB over 15 minute conversation with rare min A   Time 6   Period Weeks   Status On-going     SLP LONG TERM GOAL #2   Title Pt will maintain audible/intelligible speech in noisy environment outside of tx room over 12 minutes with rare min A   Time 6   Period Weeks   Status On-going          Plan - 11/28/15 0847    Clinical Impression Statement Pt continues requiring skilled ST for reduced speaking volume and rushes of speech which diminish speech intelligbility. Pt's speech in simple conversation was WNL with rare SLP A. SLP spent  some of today's session reiterating/reeducating pt on reason for loud /a/, and practice, and that his WNL loudness speech was not shouting, using a digital recorder. Pt would cont to benefit from skilled ST to recalibrate his loudness to WNL in spontaneous and more complex linguistic situations.    Speech Therapy Frequency 2x / week   Treatment/Interventions Compensatory strategies;Oral motor exercises;Functional tasks;Multimodal communcation approach;Patient/family education;SLP instruction and feedback;Internal/external aids   Potential to Achieve Goals Good   Consulted and Agree with Plan of Care Patient      Patient will benefit from skilled therapeutic intervention in order to improve the following deficits and impairments:   Dysarthria and anarthria    Problem List Patient Active Problem List   Diagnosis Date Noted  . Parkinson disease ([place])     [facility] ,[place], [place]  11/28/2015, 9:10 AM  [facility] [address] [place], [name], [address] Phone: [phone]   Fax:  [phone]   Name: Ronald Noble MRN: [id number] Date of Birth: 1945-07-09

## 2015-11-28 NOTE — Patient Instructions (Signed)
Warm up with reading, but don't just do reading for homework.

## 2015-11-28 NOTE — Therapy (Signed)
[facility] [address] [place], [name], [address] Phone: [phone]   Fax:  [phone]  Occupational Therapy Treatment  Patient Details  Name: Ronald Noble MRN: [id number] Date of Birth: 02-Jan-1946 Referring Provider: Dr. Star Age  Encounter Date: 11/28/2015      OT End of Session - 11/28/15 0900    Visit Number 6   Number of Visits 17   Date for OT Re-Evaluation 12/29/15   Authorization Type MCR 1*, BC/BS 2* - G Code needed   Authorization - Visit Number 6   Authorization - Number of Visits 10   OT Start Time 0848   OT Stop Time 0930   OT Time Calculation (min) 42 min   Activity Tolerance Patient tolerated treatment well   Behavior During Therapy [facility] for tasks assessed/performed      Past Medical History:  Diagnosis Date  . BPH (benign prostatic hyperplasia)   . Parkinson disease ([place])   . Skin cancer of face    arms and back  . Sleep apnea    mild  . Spider bite    brown recluse/ left ankle/08/13    Past Surgical History:  Procedure Laterality Date  . BURR HOLE W/ STEREOTACTIC INSERTION OF DBS LEADS / INTRAOP MICROELECTRODE RECORDING    . COLONOSCOPY    . KNEE SURGERY     exploratory/left  . SKIN CANCER EXCISION  01/2014   face  . TRANSURETHRAL RESECTION OF PROSTATE     2008    There were no vitals filed for this visit.      Subjective Assessment - 11/28/15 0849    Subjective  Pt reports that he was stiffer in ST today with posture due to not having OT first   Pertinent History PD since 2008   Limitations DBS, falls, orthostatic hypotension   Patient Stated Goals increase handwriting   Currently in Pain? Yes   Pain Score 3    Pain Location --  scapula, neck, shoulders   Pain Orientation Right;Left   Pain Descriptors / Indicators Aching;Sore;Discomfort;Sharp   Pain Type Chronic pain   Pain Onset More than a month ago   Pain Frequency Intermittent   Aggravating Factors   certain movements   Pain Relieving Factors stretching, ?               Recommended pt take meds as usual for next few therapy session as he may have incr benefit from exercise as he will be able to move better (pt has been waiting to allow therapist to see "off time.")               OT Education - 11/28/15 0920    Education Details PWR! moves in quadraped, quadraped>standing for floor transfer; reviewed coordination HEP and added coin ex  (see pt instructions)   Person(s) Educated Patient   Methods Explanation;Demonstration;Verbal cues;Handout   Comprehension Verbal cues required;Returned demonstration;Verbalized understanding  needed cueing for posture, large amplitude movements, and timing with LUE          OT Short Term Goals - 10/29/15 1451      OT SHORT TERM GOAL #1   Title Pt will be independent with PD specific HEP (All STG's due 11/29/15)    Time 4   Period Weeks   Status New     OT SHORT TERM GOAL #2   Title Pt will write at least 3 sentences with no significant decr in size maintaining legibility   Time  4   Period Weeks   Status New     OT SHORT TERM GOAL #3   Title Pt will verbalize understanding of ways to prevent future PD related complications and appropriate community resources prn   Time 4   Period Weeks   Status New     OT SHORT TERM GOAL #4   Title Pt to hook/unhook 3 buttons for UE dressing in 28 sec. or less   Baseline eval = 33.32 sec   Time 4   Period Weeks   Status New     OT SHORT TERM GOAL #5   Title Pt will perform PPT #4 in 23 sec. or less for donning/doffing jacket   Baseline eval = 28.32 sec.    Time 4   Period Weeks   Status New           OT Long Term Goals - 11/14/15 0908      OT LONG TERM GOAL #1   Title Pt will verbalize understanding of adaptive strategies to incr ease with ADL's/IADLS's (All LTG's due 12/29/15)   Time 8   Period Weeks   Status New     OT LONG TERM GOAL #2   Title Pt will verbalize  understanding of ways to keep thinking skills sharp, compensate for memory changes in the future, and increase awareness of PD complications/deficits   Time 8   Period Weeks   Status New     OT LONG TERM GOAL #3   Title Pt will hook and unhook 3 buttons for incr ease with UE dressing in 24 sec. or less   Baseline eval = 33.32 sec   Time 8   Period Weeks   Status New     OT LONG TERM GOAL #4   Title Pt will perform PPT #4 in 18 sec. or less for increased ease with donning/doffing jacket   Baseline eval = 28.32 sec.    Time 8   Period Weeks   Status New     OT LONG TERM GOAL #5   Title TBD - appropriate standing functional reach goal prn    Time 8   Period Weeks   Status Deferred  16" bilaterally               Plan - 11/28/15 0902    Clinical Impression Statement Pt with timing deficits with coordination tasks L hand with min cues to slow down.  Pt also needs cueing for posture during coordination tasks.  However, pt is progressing towards goals and has been coming to therapy prior taking meds.     Rehab Potential Good   OT Frequency 2x / week   OT Duration 8 weeks   OT Treatment/Interventions Self-care/ADL training;Moist Heat;DME and/or AE instruction;Patient/family education;Therapeutic exercises;Therapeutic activities;Neuromuscular education;Functional Mobility Training;Passive range of motion;Manual Therapy;Cognitive remediation/compensation;Visual/perceptual remediation/compensation;Energy conservation   Plan begin checking STGs, review PWR! quadraped; add tossing ball and rotating 2 balls to coordination HEP   OT Home Exercise Plan 11/14/15: PWR! Seated; PWR! supine; 11/19/15  PWR! hands; 11/25/15 coordination HEP   Consulted and Agree with Plan of Care Patient      Patient will benefit from skilled therapeutic intervention in order to improve the following deficits and impairments:  Decreased coordination, Difficulty walking, Impaired flexibility, Improper body  mechanics, Decreased endurance, Decreased safety awareness, Improper spinal/pelvic alignment, Decreased knowledge of precautions, Decreased activity tolerance, Impaired tone, Decreased balance, Decreased knowledge of use of DME, Impaired UE functional use, Decreased cognition, Decreased mobility, Impaired  vision/preception, Impaired perceived functional ability  Visit Diagnosis: Other symptoms and signs involving the musculoskeletal system  Other symptoms and signs involving the nervous system  Stiffness of right shoulder, not elsewhere classified  Stiffness of left shoulder, not elsewhere classified  Other lack of coordination  Abnormal posture  Unsteadiness on feet  Other abnormalities of gait and mobility    Problem List Patient Active Problem List   Diagnosis Date Noted  . Parkinson disease [facility])     [facility] 11/28/2015, 12:56 PM  [place] [address] [place], [name], [address] Phone: [phone]   Fax:  [phone]  Name: Ronald Noble MRN: [id number] Date of Birth: 1945/09/27   Vianne Bulls, OTR/L [facility] [address] [place], [place]  [address] [phone] phone [phone] 11/28/15 12:56 PM

## 2015-12-03 ENCOUNTER — Ambulatory Visit: Payer: Medicare Other | Admitting: Speech Pathology

## 2015-12-03 ENCOUNTER — Ambulatory Visit: Payer: Medicare Other | Admitting: Occupational Therapy

## 2015-12-03 DIAGNOSIS — M25612 Stiffness of left shoulder, not elsewhere classified: Secondary | ICD-10-CM

## 2015-12-03 DIAGNOSIS — R29898 Other symptoms and signs involving the musculoskeletal system: Secondary | ICD-10-CM | POA: Diagnosis not present

## 2015-12-03 DIAGNOSIS — M5489 Other dorsalgia: Secondary | ICD-10-CM | POA: Diagnosis not present

## 2015-12-03 DIAGNOSIS — M47819 Spondylosis without myelopathy or radiculopathy, site unspecified: Secondary | ICD-10-CM | POA: Diagnosis not present

## 2015-12-03 DIAGNOSIS — M25611 Stiffness of right shoulder, not elsewhere classified: Secondary | ICD-10-CM

## 2015-12-03 DIAGNOSIS — R471 Dysarthria and anarthria: Secondary | ICD-10-CM

## 2015-12-03 DIAGNOSIS — R278 Other lack of coordination: Secondary | ICD-10-CM | POA: Diagnosis not present

## 2015-12-03 DIAGNOSIS — R29818 Other symptoms and signs involving the nervous system: Secondary | ICD-10-CM

## 2015-12-03 DIAGNOSIS — R293 Abnormal posture: Secondary | ICD-10-CM

## 2015-12-03 NOTE — Therapy (Signed)
[facility] [address] [place], [place], [address] Phone: [phone]   Fax:  [phone]  Occupational Therapy Treatment  Patient Details  Name: Ronald Noble MRN: [id number] Date of Birth: 09/15/1945 Referring Provider: Dr. Saima Athar  Encounter Date: 12/03/2015      OT End of Session - 12/03/15 0857    Visit Number 7   Number of Visits 17   Date for OT Re-Evaluation 12/29/15   Authorization Type MCR 1*, BC/BS 2* - G Code needed   Authorization - Visit Number 7   Authorization - Number of Visits 10   OT Start Time 0800   OT Stop Time 0850   OT Time Calculation (min) 50 min   Activity Tolerance Patient tolerated treatment well      Past Medical History:  Diagnosis Date  . BPH (benign prostatic hyperplasia)   . Parkinson disease ([facility])   . Skin cancer of face    arms and back  . Sleep apnea    mild  . Spider bite    brown recluse/ left ankle/08/13    Past Surgical History:  Procedure Laterality Date  . BURR HOLE W/ STEREOTACTIC INSERTION OF DBS LEADS / INTRAOP MICROELECTRODE RECORDING    . COLONOSCOPY    . KNEE SURGERY     exploratory/left  . SKIN CANCER EXCISION  01/2014   face  . TRANSURETHRAL RESECTION OF PROSTATE     2008    There were no vitals filed for this visit.      Subjective Assessment - 12/03/15 0806    Pertinent History PD since 2008   Limitations DBS, falls, orthostatic hypotension   Patient Stated Goals increase handwriting   Currently in Pain? Yes   Pain Location --  scapula, back, shoulders   Pain Orientation Right;Left   Pain Descriptors / Indicators Aching;Sore;Discomfort   Pain Type Chronic pain   Pain Onset More than a month ago   Pain Frequency Intermittent   Aggravating Factors  certain movements   Pain Relieving Factors stretching                      OT Treatments/Exercises (OP) - 12/03/15 0001      ADLs   UB Dressing Pt shown  strategies for donning/doffing jacket and pt practiced. Also recommended slightly larger jacket and to avoid elastic cuffs if possible.    ADL Comments Began assessing STG's. Also discussed reasons for back pain and potential shoulder complications from PD.      Fine Motor Coordination   Other Fine Motor Exercises Added 3 additional coordination ex's to HEP - see pt instructions for details. Pt performing each bilaterally 5 times     Neurological Re-education Exercises   Other Exercises 1 Reviewed PWR! moves in quadraped x 10 reps. Pt requires cues for breathing during all ex's (especially PWR! Rock and Step).                 OT Education - 12/03/15 0835    Education provided Yes   Education Details review of PWR! moves in quadraped, additional coordination HEP    Person(s) Educated Patient   Methods Explanation;Demonstration;Handout   Comprehension Verbalized understanding;Returned demonstration          OT Short Term Goals - 12/03/15 0857      OT SHORT TERM GOAL #1   Title Pt will be independent with PD specific HEP (All STG's due 11/29/15)    Time 4     Period Weeks   Status Achieved     OT SHORT TERM GOAL #2   Title Pt will write at least 3 sentences with no significant decr in size maintaining legibility   Time 4   Period Weeks   Status New     OT SHORT TERM GOAL #3   Title Pt will verbalize understanding of ways to prevent future PD related complications and appropriate community resources prn   Time 4   Period Weeks   Status On-going     OT SHORT TERM GOAL #4   Title Pt to hook/unhook 3 buttons for UE dressing in 28 sec. or less   Baseline eval = 33.32 sec   Time 4   Period Weeks   Status New     OT SHORT TERM GOAL #5   Title Pt will perform PPT #4 in 23 sec. or less for donning/doffing jacket   Baseline eval = 28.32 sec.    Time 4   Period Weeks   Status Achieved  16 sec.            OT Long Term Goals - 11/14/15 0908      OT LONG TERM GOAL  #1   Title Pt will verbalize understanding of adaptive strategies to incr ease with ADL's/IADLS's (All LTG's due 12/29/15)   Time 8   Period Weeks   Status New     OT LONG TERM GOAL #2   Title Pt will verbalize understanding of ways to keep thinking skills sharp, compensate for memory changes in the future, and increase awareness of PD complications/deficits   Time 8   Period Weeks   Status New     OT LONG TERM GOAL #3   Title Pt will hook and unhook 3 buttons for incr ease with UE dressing in 24 sec. or less   Baseline eval = 33.32 sec   Time 8   Period Weeks   Status New     OT LONG TERM GOAL #4   Title Pt will perform PPT #4 in 18 sec. or less for increased ease with donning/doffing jacket   Baseline eval = 28.32 sec.    Time 8   Period Weeks   Status New     OT LONG TERM GOAL #5   Title TBD - appropriate standing functional reach goal prn    Time 8   Period Weeks   Status Deferred  16" bilaterally               Plan - 12/03/15 0858    Clinical Impression Statement Pt progressing towards remaining STG's. Pt met STG #1 AND #5. STG #3 ongoing. Pt required cues for breathing as pt would hold breath   Rehab Potential Good   OT Frequency 2x / week   OT Duration 8 weeks   OT Treatment/Interventions Self-care/ADL training;Moist Heat;DME and/or AE instruction;Patient/family education;Therapeutic exercises;Therapeutic activities;Neuromuscular education;Functional Mobility Training;Passive range of motion;Manual Therapy;Cognitive remediation/compensation;Visual/perceptual remediation/compensation;Energy conservation   Plan Assess remaining STG's (#2, #4). Issue exercise flowsheet chart for consistency of HEP's, Issue POP info, review PWR! standing from previous P.T. admission   OT Home Exercise Plan 11/14/15: PWR! Seated; PWR! supine; 11/19/15  PWR! hands; 11/25/15 coordination HEP. 11/28/15: PWR! Quadraped. 12/03/15: additional coordination HEP       Patient will benefit from  skilled therapeutic intervention in order to improve the following deficits and impairments:  Decreased coordination, Difficulty walking, Impaired flexibility, Improper body mechanics, Decreased endurance, Decreased safety awareness, Improper spinal/pelvic alignment,  Decreased knowledge of precautions, Decreased activity tolerance, Impaired tone, Decreased balance, Decreased knowledge of use of DME, Impaired UE functional use, Decreased cognition, Decreased mobility, Impaired vision/preception, Impaired perceived functional ability  Visit Diagnosis: Other symptoms and signs involving the musculoskeletal system  Other symptoms and signs involving the nervous system  Stiffness of right shoulder, not elsewhere classified  Stiffness of left shoulder, not elsewhere classified  Other lack of coordination  Abnormal posture    Problem List Patient Active Problem List   Diagnosis Date Noted  . Parkinson disease ([facility])     Ballie, Kelly Johnson, OTR/L 12/03/2015, 9:02 AM  [facility] [address] [place], [place], [address] Phone: [phone]   Fax:  [phone]  Name: Seville Douglas Brimage MRN: [id number] Date of Birth: 12/14/1945  

## 2015-12-03 NOTE — Therapy (Signed)
[place] [address] [place], [name], [address] Phone: [phone]   Fax:  [phone]  Speech Language Pathology Treatment  Patient Details  Name: Ronald Noble MRN: [id number] Date of Birth: [birth year] Referring Provider: Dr. Star Age  Encounter Date: 12/03/2015      End of Session - 12/03/15 1218    Visit Number 7   Number of Visits 17   Date for SLP Re-Evaluation 12/24/15   SLP Start Time 0846   SLP Stop Time  0928   SLP Time Calculation (min) 42 min      Past Medical History:  Diagnosis Date  . BPH (benign prostatic hyperplasia)   . Parkinson disease ([place])   . Skin cancer of face    arms and back  . Sleep apnea    mild  . Spider bite    brown recluse/ left ankle/08/13    Past Surgical History:  Procedure Laterality Date  . BURR HOLE W/ STEREOTACTIC INSERTION OF DBS LEADS / INTRAOP MICROELECTRODE RECORDING    . COLONOSCOPY    . KNEE SURGERY     exploratory/left  . SKIN CANCER EXCISION  01/2014   face  . TRANSURETHRAL RESECTION OF PROSTATE     2008    There were no vitals filed for this visit.      Subjective Assessment - 12/03/15 0851    Subjective "I have to make an effort to be loud"   Currently in Pain? No/denies               ADULT SLP TREATMENT - 12/03/15 0853      General Information   Behavior/Cognition Alert;Cooperative;Pleasant mood     Treatment Provided   Treatment provided Cognitive-Linquistic     Cognitive-Linquistic Treatment   Treatment focused on Dysarthria   Skilled Treatment Recalibrated loudness with average of 92dB and SBA. Strcutred speech tasks with multiple meaing sentences with average of  70dB - pt required usual min to mod A for breath support in between the 2 senteces to reduce volume decay and rapid rate of speech with reduced breath support. Simple conversation again with mod A for breath support for speech - average of 69dB.      Assessment / Recommendations / Plan   Plan Continue with current plan of care     Progression Toward Goals   Progression toward goals Progressing toward goals            SLP Short Term Goals - 12/03/15 1218      SLP SHORT TERM GOAL #1   Title Pt will average 93dB on loud /a/ over 3 sessions with rare min A   Status Achieved     SLP SHORT TERM GOAL #2   Title Pt will average 72dB on structured speech tasks with rare min A   Time 1   Period Weeks   Status On-going     SLP SHORT TERM GOAL #3   Title Pt will maintain 70dB over 8 minute simple conversation with occasional min A   Time 1   Period Weeks   Status On-going          SLP Long Term Goals - 12/03/15 1218      SLP LONG TERM GOAL #1   Title Pt will maintain 70dB over 15 minute conversation with rare min A   Time 5   Period Weeks   Status On-going     SLP LONG TERM GOAL #2   Title  Pt will maintain audible/intelligible speech in noisy environment outside of tx room over 12 minutes with rare min A   Time 5   Period Weeks   Status On-going          Plan - 12/03/15 1215    Clinical Impression Statement Pt required ongoing mod A for breath support, volume and rate of speech. Conversation today averaged 69dB with volume decay. He reports he is on the "off" part of his PD meds. Continue skilled ST to maximize intellgiblity   Speech Therapy Frequency 2x / week   Treatment/Interventions Compensatory strategies;Oral motor exercises;Functional tasks;Multimodal communcation approach;Patient/family education;SLP instruction and feedback;Internal/external aids   Potential to Achieve Goals Good   Consulted and Agree with Plan of Care Patient      Patient will benefit from skilled therapeutic intervention in order to improve the following deficits and impairments:   Dysarthria and anarthria    Problem List Patient Active Problem List   Diagnosis Date Noted  . Parkinson disease ([place])     Sian Joles, Annye Rusk MS,  CCC-SLP 12/03/2015, 12:19 PM  [place] [address] [place], [name], [address] Phone: [phone]   Fax:  [phone]   Name: Ronald Noble MRN: [id number] Date of Birth: [birth year]

## 2015-12-03 NOTE — Patient Instructions (Signed)
   Coordination Exercises  Perform the following exercises for 10 minutes 1 times per day. Perform with both hand(s). Perform using big movements.   Toss ball from one hand to the other: Toss big/high.  Toss ball in the air and catch with the same hand: Toss big/high.  Rotate 2 golf balls in your hand: Both directions.

## 2015-12-03 NOTE — Patient Instructions (Signed)
  Breathe Breathe Breathe  Breathe before AH!  Unscramble sentences aloud with big breath first  When someone asks you to repeat yourself, take a big breath.   Breathe more frequently in speech when you feel like you are pushing out your words quickly   When you are having an "off" period:  Get the persons attention when you before you talk   Be in close proximity to the person you are talking to  Face to face conversation with good eye contact  Reduce noise in the environment: mute the TV, walk away from running appliances, turn off the faucet etc

## 2015-12-05 ENCOUNTER — Ambulatory Visit: Payer: Medicare Other | Admitting: Speech Pathology

## 2015-12-05 ENCOUNTER — Ambulatory Visit: Payer: Medicare Other | Admitting: Occupational Therapy

## 2015-12-05 DIAGNOSIS — R2681 Unsteadiness on feet: Secondary | ICD-10-CM

## 2015-12-05 DIAGNOSIS — R29818 Other symptoms and signs involving the nervous system: Secondary | ICD-10-CM

## 2015-12-05 DIAGNOSIS — M25612 Stiffness of left shoulder, not elsewhere classified: Secondary | ICD-10-CM

## 2015-12-05 DIAGNOSIS — R29898 Other symptoms and signs involving the musculoskeletal system: Secondary | ICD-10-CM

## 2015-12-05 DIAGNOSIS — M25611 Stiffness of right shoulder, not elsewhere classified: Secondary | ICD-10-CM

## 2015-12-05 DIAGNOSIS — R278 Other lack of coordination: Secondary | ICD-10-CM | POA: Diagnosis not present

## 2015-12-05 DIAGNOSIS — R293 Abnormal posture: Secondary | ICD-10-CM | POA: Diagnosis not present

## 2015-12-05 DIAGNOSIS — R2689 Other abnormalities of gait and mobility: Secondary | ICD-10-CM

## 2015-12-05 DIAGNOSIS — R471 Dysarthria and anarthria: Secondary | ICD-10-CM

## 2015-12-05 NOTE — Therapy (Signed)
[facility] [address] [place], [name], [address] Phone: [phone]   Fax:  [phone]  Occupational Therapy Treatment  Patient Details  Name: Ronald Noble MRN: [id number] Date of Birth: 15-Apr-1945 Referring Provider: Dr. Star Age  Encounter Date: 12/05/2015      OT End of Session - 12/05/15 0815    Visit Number 8   Number of Visits 17   Date for OT Re-Evaluation 12/29/15   Authorization Type MCR 1*, BC/BS 2* - G Code needed   Authorization - Visit Number 8   Authorization - Number of Visits 10   OT Start Time 0802   OT Stop Time 0845   OT Time Calculation (min) 43 min   Activity Tolerance Patient tolerated treatment well   Behavior During Therapy [facility] for tasks assessed/performed      Past Medical History:  Diagnosis Date  . BPH (benign prostatic hyperplasia)   . Parkinson disease ([place])   . Skin cancer of face    arms and back  . Sleep apnea    mild  . Spider bite    brown recluse/ left ankle/08/13    Past Surgical History:  Procedure Laterality Date  . BURR HOLE W/ STEREOTACTIC INSERTION OF DBS LEADS / INTRAOP MICROELECTRODE RECORDING    . COLONOSCOPY    . KNEE SURGERY     exploratory/left  . SKIN CANCER EXCISION  01/2014   face  . TRANSURETHRAL RESECTION OF PROSTATE     2008    There were no vitals filed for this visit.      Subjective Assessment - 12/05/15 0815    Subjective  Pt reports only stiffness today   Pertinent History PD since 2008   Limitations DBS, falls, orthostatic hypotension   Patient Stated Goals increase handwriting   Currently in Pain? No/denies        Neuro re-ed:  PWR! Moves (basic 4) in prone and standing x 10-20 each with min cues For incr movement amplitude.   Self Care:      Dressing:   Practiced buttoning/unbuttoning shirt on table top with min cues for use of PWR! Hands prior to buttoning and use of deliberate/large amplitude  movements after instruction.  Pt demo improvement with repetition and use of large amplitude movements.  Writing:  Practiced copying  sentences with regular grip and built-up (tan foam) pens with focus/min cues on letter formation/size.  Pt with no significant decr in size and demo incr letter formation/size with built-up grip and issued 2 tan foam grips for work/home.                           OT Education - 12/05/15 [phone]    Education Details Use of PD ex chart/flowsheet (review importance of walking/aerobic ex and best ways to incorporate aerobic ex without movement size decr); Reviewed PWR! moves in prone (basic 4) and standing (basic 4) which were previously issued by PT   Person(s) Educated Patient   Methods Explanation;Demonstration;Verbal cues;Handout   Comprehension Verbalized understanding;Returned demonstration;Verbal cues required          OT Short Term Goals - 12/05/15 0817      OT SHORT TERM GOAL #1   Title Pt will be independent with PD specific HEP (All STG's due 11/29/15)    Time 4   Period Weeks   Status Achieved     OT SHORT TERM GOAL #2   Title Pt will  write at least 3 sentences with no significant decr in size maintaining legibility   Time 4   Period Weeks   Status Achieved  12/05/15:  met     OT SHORT TERM GOAL #3   Title Pt will verbalize understanding of ways to prevent future PD related complications and appropriate community resources prn   Time 4   Period Weeks   Status On-going     OT SHORT TERM GOAL #4   Title Pt to hook/unhook 3 buttons for UE dressing in 28 sec. or less   Baseline eval = 33.32 sec   Time 4   Period Weeks   Status On-going  33.32sec     OT SHORT TERM GOAL #5   Title Pt will perform PPT #4 in 23 sec. or less for donning/doffing jacket   Baseline eval = 28.32 sec.    Time 4   Period Weeks   Status Achieved  16 sec.            OT Long Term Goals - 11/14/15 0908      OT LONG TERM GOAL #1    Title Pt will verbalize understanding of adaptive strategies to incr ease with ADL's/IADLS's (All LTG's due 12/29/15)   Time 8   Period Weeks   Status New     OT LONG TERM GOAL #2   Title Pt will verbalize understanding of ways to keep thinking skills sharp, compensate for memory changes in the future, and increase awareness of PD complications/deficits   Time 8   Period Weeks   Status New     OT LONG TERM GOAL #3   Title Pt will hook and unhook 3 buttons for incr ease with UE dressing in 24 sec. or less   Baseline eval = 33.32 sec   Time 8   Period Weeks   Status New     OT LONG TERM GOAL #4   Title Pt will perform PPT #4 in 18 sec. or less for increased ease with donning/doffing jacket   Baseline eval = 28.32 sec.    Time 8   Period Weeks   Status New     OT LONG TERM GOAL #5   Title TBD - appropriate standing functional reach goal prn    Time 8   Period Weeks   Status Deferred  16" bilaterally               Plan - 12/05/15 0816    Clinical Impression Statement Pt making good progress towards goals.  Pt verbalized understanding of use of PD ex flowsheet and reports performing HEP.  Pt able to write with good legibility today with no significant decr in size with using built-up grip.   Rehab Potential Good   OT Frequency 2x / week   OT Duration 8 weeks   OT Treatment/Interventions Self-care/ADL training;Moist Heat;DME and/or AE instruction;Patient/family education;Therapeutic exercises;Therapeutic activities;Neuromuscular education;Functional Mobility Training;Passive range of motion;Manual Therapy;Cognitive remediation/compensation;Visual/perceptual remediation/compensation;Energy conservation   Plan review POP info, large amplitude movements for ADLs, coordination activities with large movements   OT Home Exercise Plan 11/14/15: PWR! Seated; PWR! supine; 11/19/15  PWR! hands; 11/25/15 coordination HEP. 11/28/15: PWR! Quadraped. 12/03/15: additional coordination HEP; PWR!  moves in prone and standing, PD ex. chart 12/05/15    Consulted and Agree with Plan of Care Patient      Patient will benefit from skilled therapeutic intervention in order to improve the following deficits and impairments:  Decreased coordination, Difficulty walking, Impaired flexibility,  Improper body mechanics, Decreased endurance, Decreased safety awareness, Improper spinal/pelvic alignment, Decreased knowledge of precautions, Decreased activity tolerance, Impaired tone, Decreased balance, Decreased knowledge of use of DME, Impaired UE functional use, Decreased cognition, Decreased mobility, Impaired vision/preception, Impaired perceived functional ability  Visit Diagnosis: Other symptoms and signs involving the musculoskeletal system  Other symptoms and signs involving the nervous system  Stiffness of right shoulder, not elsewhere classified  Stiffness of left shoulder, not elsewhere classified  Other lack of coordination  Abnormal posture  Other abnormalities of gait and mobility  Unsteadiness on feet    Problem List Patient Active Problem List   Diagnosis Date Noted  . Parkinson disease [facility])     [facility] 12/05/2015, 12:39 PM  [facility] [address] [place], [name], [address] Phone: [phone]   Fax:  [phone]  Name: Ronald Noble MRN: [id number] Date of Birth: 01-21-1946   Vianne Bulls, OTR/L [facility] [address] [place], [place]  [address] [phone] phone [phone] 12/05/15 12:39 PM

## 2015-12-05 NOTE — Patient Instructions (Signed)
(  Exercise) Monday Tuesday Wednesday Thursday Friday Saturday Sunday   PWR! Hands (everyday)           PWR! Supine (on back)           PWR! Prone (on stomach)           PWR! standing           PWR! sitting           PWR! quadraped  (on hands/knees)           Coordination (4 times/week)          Aerobic (30 min, 3-5x/wk)           Walking (20-44min, 3-5x/week)

## 2015-12-05 NOTE — Therapy (Signed)
[facility] [address] [place], [name], [address] Phone: [phone]   Fax:  [phone]  Speech Language Pathology Treatment  Patient Details  Name: Ronald Noble MRN: [id number] Date of Birth: 09-Mar-1946 Referring Provider: Dr. Star Age  Encounter Date: 12/05/2015      End of Session - 12/05/15 0929    Visit Number 8   Number of Visits 17   Date for SLP Re-Evaluation 12/24/15   SLP Start Time 0847   SLP Stop Time  0929   SLP Time Calculation (min) 42 min      Past Medical History:  Diagnosis Date  . BPH (benign prostatic hyperplasia)   . Parkinson disease ([place])   . Skin cancer of face    arms and back  . Sleep apnea    mild  . Spider bite    brown recluse/ left ankle/08/13    Past Surgical History:  Procedure Laterality Date  . BURR HOLE W/ STEREOTACTIC INSERTION OF DBS LEADS / INTRAOP MICROELECTRODE RECORDING    . COLONOSCOPY    . KNEE SURGERY     exploratory/left  . SKIN CANCER EXCISION  01/2014   face  . TRANSURETHRAL RESECTION OF PROSTATE     2008    There were no vitals filed for this visit.      Subjective Assessment - 12/05/15 0851    Subjective "I can practice my speech anytime, when I'm talking to someone"   Currently in Pain? No/denies               ADULT SLP TREATMENT - 12/05/15 0852      General Information   Behavior/Cognition Alert;Cooperative;Pleasant mood     Treatment Provided   Treatment provided Cognitive-Linquistic     Cognitive-Linquistic Treatment   Treatment focused on Dysarthria   Skilled Treatment Loud /a/ average 94dB with mod I. Simple conversation average 70dB with rare min A over 20 minutes - pt much improved from last session  - he reports he is better when he is not in an adjustment period from his meds, so he did not take meds purposely prior to therapy today.      Assessment / Recommendations / Plan   Plan Continue with current  plan of care     Progression Toward Goals   Progression toward goals Progressing toward goals            SLP Short Term Goals - 12/05/15 0928      SLP SHORT TERM GOAL #1   Title Pt will average 93dB on loud /a/ over 3 sessions with rare min A   Status Achieved     SLP SHORT TERM GOAL #2   Title Pt will average 72dB on structured speech tasks with rare min A   Time 1   Period Weeks   Status On-going     SLP SHORT TERM GOAL #3   Title Pt will maintain 70dB over 8 minute simple conversation with occasional min A   Time 1   Period Weeks   Status Achieved          SLP Long Term Goals - 12/05/15 [id number]      SLP LONG TERM GOAL #1   Title Pt will maintain 70dB over 15 minute conversation with rare min A   Time 5   Period Weeks   Status On-going     SLP LONG TERM GOAL #2   Title Pt will maintain audible/intelligible speech in noisy  environment outside of tx room over 12 minutes with rare min A   Time 5   Period Weeks   Status On-going          Plan - 12/05/15 [id number]    Clinical Impression Statement Pt improved since last session, he attributes to not being in an adjustment period from meds. Posture and breath support improved with rare min A. Pt maintined loudness of 70dB in conversation with rare min A. Continue skilled ST to maximize carryover of compensations for dysarthria and intellgibility.    Speech Therapy Frequency 2x / week   Duration --  5 weeks   Treatment/Interventions Compensatory strategies;Oral motor exercises;Functional tasks;Multimodal communcation approach;Patient/family education;SLP instruction and feedback;Internal/external aids   Potential to Achieve Goals Good   Consulted and Agree with Plan of Care Patient      Patient will benefit from skilled therapeutic intervention in order to improve the following deficits and impairments:   Dysarthria and anarthria    Problem List Patient Active Problem List   Diagnosis Date Noted  . Parkinson  disease ([place])     Alichia Alridge, Annye Rusk MS, CCC-SLP 12/05/2015, 9:29 AM  [facility] [address] [place], [name], [address] Phone: [phone]   Fax:  [phone]   Name: Ronald Noble MRN: [id number] Date of Birth: 02-11-1946

## 2015-12-10 ENCOUNTER — Ambulatory Visit: Payer: Medicare Other

## 2015-12-10 ENCOUNTER — Ambulatory Visit: Payer: Medicare Other | Attending: Internal Medicine | Admitting: Occupational Therapy

## 2015-12-10 DIAGNOSIS — R41840 Attention and concentration deficit: Secondary | ICD-10-CM | POA: Diagnosis not present

## 2015-12-10 DIAGNOSIS — R2689 Other abnormalities of gait and mobility: Secondary | ICD-10-CM | POA: Diagnosis not present

## 2015-12-10 DIAGNOSIS — R278 Other lack of coordination: Secondary | ICD-10-CM | POA: Insufficient documentation

## 2015-12-10 DIAGNOSIS — M25611 Stiffness of right shoulder, not elsewhere classified: Secondary | ICD-10-CM | POA: Insufficient documentation

## 2015-12-10 DIAGNOSIS — R29818 Other symptoms and signs involving the nervous system: Secondary | ICD-10-CM | POA: Diagnosis not present

## 2015-12-10 DIAGNOSIS — R293 Abnormal posture: Secondary | ICD-10-CM | POA: Insufficient documentation

## 2015-12-10 DIAGNOSIS — R29898 Other symptoms and signs involving the musculoskeletal system: Secondary | ICD-10-CM | POA: Insufficient documentation

## 2015-12-10 DIAGNOSIS — R471 Dysarthria and anarthria: Secondary | ICD-10-CM | POA: Diagnosis not present

## 2015-12-10 DIAGNOSIS — M25612 Stiffness of left shoulder, not elsewhere classified: Secondary | ICD-10-CM | POA: Insufficient documentation

## 2015-12-10 NOTE — Therapy (Signed)
[facility] [address] [place], [name], [address] Phone: [phone]   Fax:  [phone]  Speech Language Pathology Treatment  Patient Details  Name: Ronald Noble MRN: [id number] Date of Birth: January 01, 1946 Referring Provider: Dr. Star Age  Encounter Date: 12/10/2015      End of Session - 12/10/15 0934    Visit Number 9   Number of Visits 17   Date for SLP Re-Evaluation 12/24/15   SLP Start Time 0848   SLP Stop Time  0930   SLP Time Calculation (min) 42 min   Activity Tolerance Patient tolerated treatment well      Past Medical History:  Diagnosis Date  . BPH (benign prostatic hyperplasia)   . Parkinson disease ([place])   . Skin cancer of face    arms and back  . Sleep apnea    mild  . Spider bite    brown recluse/ left ankle/08/13    Past Surgical History:  Procedure Laterality Date  . BURR HOLE W/ STEREOTACTIC INSERTION OF DBS LEADS / INTRAOP MICROELECTRODE RECORDING    . COLONOSCOPY    . KNEE SURGERY     exploratory/left  . SKIN CANCER EXCISION  01/2014   face  . TRANSURETHRAL RESECTION OF PROSTATE     2008    There were no vitals filed for this visit.             ADULT SLP TREATMENT - 12/10/15 0856      General Information   Behavior/Cognition Alert;Cooperative;Pleasant mood     Treatment Provided   Treatment provided Cognitive-Linquistic     Cognitive-Linquistic Treatment   Treatment focused on Dysarthria   Skilled Treatment Loud /a/ was utilized today with average 94dB with mod I, in order to recalibrate conversational loudness to WNL range. Simple conversation average 70dB with rare min A over 15 minutes, and mod complex 15 minutes conversation with occasional min A for loudness.       Assessment / Recommendations / Plan   Plan Continue with current plan of care     Progression Toward Goals   Progression toward goals Progressing toward goals          SLP  Education - 12/10/15 0934    Education provided Yes   Education Details 7.5 effort level instead of 8 needed for vocal hygiene with loud /a/   Person(s) Educated Patient   Methods Explanation   Comprehension Verbalized understanding;Returned demonstration          SLP Short Term Goals - 12/10/15 0936      SLP SHORT TERM GOAL #1   Title Pt will average 93dB on loud /a/ over 3 sessions with rare min A   Status Achieved     SLP SHORT TERM GOAL #2   Title Pt will average 72dB on structured speech tasks with rare min A   Status Achieved     SLP SHORT TERM GOAL #3   Title Pt will maintain 70dB over 8 minute simple conversation with occasional min A   Time 1   Period Weeks   Status Achieved          SLP Long Term Goals - 12/10/15 [id number]      SLP LONG TERM GOAL #1   Title Pt will maintain 70dB over 15 minute mod complex conversation with rare min A   Time 4   Period Weeks   Status Revised     SLP LONG TERM GOAL #2  Title Pt will maintain audible/intelligible speech in noisy environment outside of tx room over 12 minutes with rare min A   Time 4   Period Weeks   Status On-going          Plan - 12/10/15 0935    Clinical Impression Statement Pt req'd min rare cues with simple conversation and occasional min A with mod complex conversation to remain at WNL loudness. Continue skilled ST to maximize carryover of compensations for dysarthria and intellgibility.    Speech Therapy Frequency 2x / week   Duration 4 weeks  5 weeks   Treatment/Interventions Compensatory strategies;Oral motor exercises;Functional tasks;Multimodal communcation approach;Patient/family education;SLP instruction and feedback;Internal/external aids   Potential to Achieve Goals Good   Consulted and Agree with Plan of Care Patient      Patient will benefit from skilled therapeutic intervention in order to improve the following deficits and impairments:   Dysarthria and anarthria    Problem  List Patient Active Problem List   Diagnosis Date Noted  . Parkinson disease ([place])     [place] ,[place], [place]  12/10/2015, 9:37 AM  [facility] [address] [place], [name], [address] Phone: [phone]   Fax:  [phone]   Name: Ronald Noble MRN: [id number] Date of Birth: 07-18-1945

## 2015-12-10 NOTE — Therapy (Signed)
[facility] [address] [place], [name], [address] Phone: [phone]   Fax:  [phone]  Occupational Therapy Treatment  Patient Details  Name: Ronald Noble MRN: [id number] Date of Birth: 05/29/1945 Referring Provider: Dr. Star Age  Encounter Date: 12/10/2015      OT End of Session - 12/10/15 0809    Visit Number 9   Number of Visits 17   Date for OT Re-Evaluation 12/29/15   Authorization Type MCR 1*, BC/BS 2* - G Code needed   Authorization - Visit Number 9   Authorization - Number of Visits 10   OT Start Time 0805   OT Stop Time 0845   OT Time Calculation (min) 40 min   Activity Tolerance Patient tolerated treatment well   Behavior During Therapy [facility] for tasks assessed/performed      Past Medical History:  Diagnosis Date  . BPH (benign prostatic hyperplasia)   . Parkinson disease ([place])   . Skin cancer of face    arms and back  . Sleep apnea    mild  . Spider bite    brown recluse/ left ankle/08/13    Past Surgical History:  Procedure Laterality Date  . BURR HOLE W/ STEREOTACTIC INSERTION OF DBS LEADS / INTRAOP MICROELECTRODE RECORDING    . COLONOSCOPY    . KNEE SURGERY     exploratory/left  . SKIN CANCER EXCISION  01/2014   face  . TRANSURETHRAL RESECTION OF PROSTATE     2008    There were no vitals filed for this visit.      Subjective Assessment - 12/10/15 0809    Subjective  Pt reports that his back has been a little sore when he first gets up   Pertinent History PD since 2008   Limitations DBS, falls, orthostatic hypotension   Patient Stated Goals increase handwriting   Currently in Pain? No/denies                              OT Education - 12/10/15 0822    Education Details Reviewed coordination HEP with use of large movements and pt returned demo with min v.c.; Power Over [place]   Person(s) Educated Patient   Methods  Explanation;Demonstration;Handout;Verbal cues   Comprehension Verbalized understanding;Returned demonstration;Verbal cues required  incr difficulty with dealing cards with R hand, picking up coins with R hand, rotating ball in fingertips R hand (and with L hand)          OT Short Term Goals - 12/05/15 0817      OT SHORT TERM GOAL #1   Title Pt will be independent with PD specific HEP (All STG's due 11/29/15)    Time 4   Period Weeks   Status Achieved     OT SHORT TERM GOAL #2   Title Pt will write at least 3 sentences with no significant decr in size maintaining legibility   Time 4   Period Weeks   Status Achieved  12/05/15:  met     OT SHORT TERM GOAL #3   Title Pt will verbalize understanding of ways to prevent future PD related complications and appropriate community resources prn   Time 4   Period Weeks   Status On-going     OT SHORT TERM GOAL #4   Title Pt to hook/unhook 3 buttons for UE dressing in 28 sec. or less   Baseline eval = 33.32  sec   Time 4   Period Weeks   Status On-going  33.32sec     OT SHORT TERM GOAL #5   Title Pt will perform PPT #4 in 23 sec. or less for donning/doffing jacket   Baseline eval = 28.32 sec.    Time 4   Period Weeks   Status Achieved  16 sec.            OT Long Term Goals - 12/10/15 0847      OT LONG TERM GOAL #1   Title Pt will verbalize understanding of adaptive strategies to incr ease with ADL's/IADLS's (All LTG's due 12/29/15)   Time 8   Period Weeks   Status New     OT LONG TERM GOAL #2   Title Pt will verbalize understanding of ways to keep thinking skills sharp, compensate for memory changes in the future, and increase awareness of PD complications/deficits   Time 8   Period Weeks   Status New     OT LONG TERM GOAL #3   Title Pt will hook and unhook 3 buttons for incr ease with UE dressing in 24 sec. or less   Baseline eval = 33.32 sec   Time 8   Period Weeks   Status New     OT LONG TERM GOAL #4   Title  Pt will perform PPT #4 in 18 sec. or less for increased ease with donning/doffing jacket   Baseline eval = 28.32 sec.    Time 8   Period Weeks   Status Achieved  16s ec     OT LONG TERM GOAL #5   Title TBD - appropriate standing functional reach goal prn    Time 8   Period Weeks   Status Deferred  16" bilaterally               Plan - 12/10/15 0810    Clinical Impression Statement Pt continues to progress towards goals and verbalized use of PD ex flowsheet.     Rehab Potential Good   OT Frequency 2x / week   OT Duration 8 weeks   OT Treatment/Interventions Self-care/ADL training;Moist Heat;DME and/or AE instruction;Patient/family education;Therapeutic exercises;Therapeutic activities;Neuromuscular education;Functional Mobility Training;Passive range of motion;Manual Therapy;Cognitive remediation/compensation;Visual/perceptual remediation/compensation;Energy conservation   Plan **G-code next session, large amplitude movement strategies for ADLs (with handout), add dual task activities if time   OT Home Exercise Plan 11/14/15: PWR! Seated; PWR! supine; 11/19/15  PWR! hands; 11/25/15 coordination HEP. 11/28/15: PWR! Quadraped. 12/03/15: additional coordination HEP; PWR! moves in prone and standing, PD ex. chart 12/05/15    Consulted and Agree with Plan of Care Patient      Patient will benefit from skilled therapeutic intervention in order to improve the following deficits and impairments:  Decreased coordination, Difficulty walking, Impaired flexibility, Improper body mechanics, Decreased endurance, Decreased safety awareness, Improper spinal/pelvic alignment, Decreased knowledge of precautions, Decreased activity tolerance, Impaired tone, Decreased balance, Decreased knowledge of use of DME, Impaired UE functional use, Decreased cognition, Decreased mobility, Impaired vision/preception, Impaired perceived functional ability  Visit Diagnosis: Other symptoms and signs involving the  musculoskeletal system  Other symptoms and signs involving the nervous system  Stiffness of right shoulder, not elsewhere classified  Stiffness of left shoulder, not elsewhere classified  Other lack of coordination  Abnormal posture  Other abnormalities of gait and mobility    Problem List Patient Active Problem List   Diagnosis Date Noted  . Parkinson disease ([place])     Vianne Bulls 12/10/2015,  8:47 AM  [facility] [address] [place], [name], [address] Phone: [phone]   Fax:  [phone]  Name: Ronald Noble MRN: [id number] Date of Birth: [birth year]   Vianne Bulls, OTR/L [facility] [address] [place], [place]  [address] [phone] phone [phone] 12/10/15 8:47 AM

## 2015-12-12 ENCOUNTER — Ambulatory Visit: Payer: Medicare Other | Admitting: Occupational Therapy

## 2015-12-12 ENCOUNTER — Ambulatory Visit: Payer: Medicare Other | Admitting: Speech Pathology

## 2015-12-12 DIAGNOSIS — M25612 Stiffness of left shoulder, not elsewhere classified: Secondary | ICD-10-CM | POA: Diagnosis not present

## 2015-12-12 DIAGNOSIS — R29818 Other symptoms and signs involving the nervous system: Secondary | ICD-10-CM

## 2015-12-12 DIAGNOSIS — R293 Abnormal posture: Secondary | ICD-10-CM | POA: Diagnosis not present

## 2015-12-12 DIAGNOSIS — R278 Other lack of coordination: Secondary | ICD-10-CM

## 2015-12-12 DIAGNOSIS — R2689 Other abnormalities of gait and mobility: Secondary | ICD-10-CM

## 2015-12-12 DIAGNOSIS — R471 Dysarthria and anarthria: Secondary | ICD-10-CM

## 2015-12-12 DIAGNOSIS — M25611 Stiffness of right shoulder, not elsewhere classified: Secondary | ICD-10-CM

## 2015-12-12 DIAGNOSIS — R29898 Other symptoms and signs involving the musculoskeletal system: Secondary | ICD-10-CM | POA: Diagnosis not present

## 2015-12-12 NOTE — Patient Instructions (Addendum)
Performing Daily Activities with Big Movements  Pick at least 2 activities a day and perform with BIG, DELIBERATE movements/effort.  Try different activities each day. This can make the activity easier and turn daily activities into exercise to prevent problems in the future!  If you are standing during the activity, make sure to keep feet apart and stand with good/big/PWR! UP posture.  Examples:  Dressing - Push arms in sleeves, twist when putting on jacket, push foot into pants, open hands to pull down shirt/put on socks/pull up pants  Buttoning - Open hands big (PWR! Hands) before fastening each button  Bathing - Wash/dry with long strokes  Brushing your teeth - Big, slow movements  Cutting food - Long deliberate cuts  Eating - Hold utensil in the middle, not the end  Picking up a cup/bottle - Open hand up big and get object all the way in palm  Opening jar/bottle - Move as much as you can with each turn  Putting on seatbelt - Twist when reaching, good posture  Hanging up clothes/getting clothes down from closet - Reach with big effort  Putting away groceries/dishes - Reach with big effort  Wiping counter/table - Move in big, long strokes  Stirring while cooking - Exaggerate movement  Cleaning windows - Move in big, long strokes  Sweeping - Move arms in big, long strokes  Vacuuming - Push with big movement  Folding clothes - Exaggerate arm movements  Washing car - Move in big, long strokes  Raking - Move arms in big, long strokes  Changing light bulb - Move as much as you can with each turn  Using a screwdriver - Move as much as you can with each turn  Walking into a store/restaurant - Walk with big steps, swing arms if able  Standing up from a chair/recliner/sofa - Scoot forward, lean forward, and stand with big effort, then shoulders back

## 2015-12-12 NOTE — Therapy (Signed)
[facility] [address] [place], [name], [address] Phone: [phone]   Fax:  [phone]  Speech Language Pathology Treatment  Patient Details  Name: Ronald Noble MRN: [id number] Date of Birth: 1945/05/09 Referring Provider: Dr. Star Age  Encounter Date: 12/12/2015      End of Session - 12/12/15 0932    Visit Number 10   Number of Visits 17   Date for SLP Re-Evaluation 12/24/15   SLP Start Time 0846   SLP Stop Time  0929   SLP Time Calculation (min) 43 min      Past Medical History:  Diagnosis Date  . BPH (benign prostatic hyperplasia)   . Parkinson disease ([place])   . Skin cancer of face    arms and back  . Sleep apnea    mild  . Spider bite    brown recluse/ left ankle/08/13    Past Surgical History:  Procedure Laterality Date  . BURR HOLE W/ STEREOTACTIC INSERTION OF DBS LEADS / INTRAOP MICROELECTRODE RECORDING    . COLONOSCOPY    . KNEE SURGERY     exploratory/left  . SKIN CANCER EXCISION  01/2014   face  . TRANSURETHRAL RESECTION OF PROSTATE     2008    There were no vitals filed for this visit.             ADULT SLP TREATMENT - 12/12/15 0853      General Information   Behavior/Cognition Alert;Cooperative;Pleasant mood     Treatment Provided   Treatment provided Cognitive-Linquistic     Cognitive-Linquistic Treatment   Treatment focused on Dysarthria   Skilled Treatment Loud /a/ average of 93dB with mod I to recalibrate loudness. Pt utilizing abdominal breathing with loud /a/ and structured speech tasks with average of  72dB with rare min cues for breath support and volume . Simple conversation average of 70dB  over 15 minutes.      Assessment / Recommendations / Plan   Plan Continue with current plan of care     Progression Toward Goals   Progression toward goals Progressing toward goals          SLP Education - 12/12/15 0931    Education provided Yes   Education Details abdominal breathing for loudness   Person(s) Educated Patient   Methods Explanation;Demonstration   Comprehension Verbalized understanding;Returned demonstration     Speech Therapy Progress Note  Dates of Reporting Period: 10/29/15 to 12/12/15  Objective Reports of Subjective Statement: "My wife says I'm not 100%, but I'm doing better" re: speaking volume - Pt maintaining 70dB for 10-15 min of simple conversation with min A  Objective Measurements: See goals  Goal Update: Continue LTG's  Plan: Pt making good progress. Continue wit current POC of twice a week for 4 more weeks  Reason Skilled Services are Required: Skilled ST required to maximize carryover of compensations for dysarthria for intelligible speech at home and in the community      SLP Short Term Goals - 12/12/15 0932      SLP SHORT TERM GOAL #1   Title Pt will average 93dB on loud /a/ over 3 sessions with rare min A   Status Achieved     SLP SHORT TERM GOAL #2   Title Pt will average 72dB on structured speech tasks with rare min A   Status Achieved     SLP SHORT TERM GOAL #3   Title Pt will maintain 70dB over 8 minute simple conversation  with occasional min A   Time 1   Period Weeks   Status Achieved          SLP Long Term Goals - 12/12/15 0932      SLP LONG TERM GOAL #1   Title Pt will maintain 70dB over 15 minute mod complex conversation with rare min A   Time 4   Period Weeks   Status Revised     SLP LONG TERM GOAL #2   Title Pt will maintain audible/intelligible speech in noisy environment outside of tx room over 12 minutes with rare min A   Time 4   Period Weeks   Status On-going          Plan - 12/12/15 0932    Clinical Impression Statement Pt req'd min rare cues with simple conversation and occasional min A with mod complex conversation to remain at WNL loudness. Continue skilled ST to maximize carryover of compensations for dysarthria and intellgibility.    Speech  Therapy Frequency 2x / week   Duration 4 weeks   Treatment/Interventions Compensatory strategies;Oral motor exercises;Functional tasks;Multimodal communcation approach;Patient/family education;SLP instruction and feedback;Internal/external aids   Potential to Achieve Goals Good   Consulted and Agree with Plan of Care Patient      Patient will benefit from skilled therapeutic intervention in order to improve the following deficits and impairments:   Dysarthria and anarthria    Problem List Patient Active Problem List   Diagnosis Date Noted  . Parkinson disease ([place])     Jaidyn Kuhl, Annye Rusk MS, CCC-SLP 12/12/2015, 9:33 AM  [facility] [address] [place], [name], [address] Phone: [phone]   Fax:  [phone]   Name: Ronald Noble MRN: [id number] Date of Birth: Oct 24, 1945

## 2015-12-12 NOTE — Therapy (Signed)
[facility] [address] [place], [name], [address] Phone: [phone]   Fax:  [phone]  Occupational Therapy Treatment  Patient Details  Name: Ronald Noble MRN: [id number] Date of Birth: [birth year] Referring Provider: Dr. Star Age  Encounter Date: 12/12/2015      OT End of Session - 12/12/15 1943    Visit Number 10   Number of Visits 17   Date for OT Re-Evaluation 12/29/15   Authorization Type MCR 1*, BC/BS 2* - G Code needed   Authorization - Visit Number 10   Authorization - Number of Visits 10   OT Start Time 0805   OT Stop Time 0845   OT Time Calculation (min) 40 min   Activity Tolerance Patient tolerated treatment well   Behavior During Therapy [facility] for tasks assessed/performed      Past Medical History:  Diagnosis Date  . BPH (benign prostatic hyperplasia)   . Parkinson disease ([place])   . Skin cancer of face    arms and back  . Sleep apnea    mild  . Spider bite    brown recluse/ left ankle/08/13    Past Surgical History:  Procedure Laterality Date  . BURR HOLE W/ STEREOTACTIC INSERTION OF DBS LEADS / INTRAOP MICROELECTRODE RECORDING    . COLONOSCOPY    . KNEE SURGERY     exploratory/left  . SKIN CANCER EXCISION  01/2014   face  . TRANSURETHRAL RESECTION OF PROSTATE     2008    There were no vitals filed for this visit.      Subjective Assessment - 12/12/15 0807    Subjective  Pt reports that he stopped his urinary medication and so he was up a lot last night with frequency.  Pt reports that exercises are going well at home.  "Twisting definately makes it easier (to get jacket off)"   Pertinent History PD since 2008   Limitations DBS, falls, orthostatic hypotension   Patient Stated Goals increase handwriting   Currently in Pain? Yes   Pain Score 3    Pain Location Back  lower   Pain Orientation Right;Left;Lower   Pain Descriptors / Indicators Aching;Sore   Pain Onset  More than a month ago   Pain Frequency Intermittent   Aggravating Factors  sit>stand   Pain Relieving Factors stretching         Self Care:     Continued instruction in importance and  in use of large amplitude movements to prevent future complications related to PD.  Pt verbalized understanding.   Eating:  Instructed pt in strategies for eating including holding utensil in the middle vs. The end, perpendicular scooping, scooting chair close to table, use of PWR! Hands, use of big intentional movements, and good posture.  Pt verbalized understanding.  Practiced grasp/release of cylinder objects (bottles, cups) with use of PWR! Hands/large amplitude movements with min cueing.  Opening/closing bottles with use of large amplitude movement strategies with min cueing.   Dressing:   Practiced donning/doffing jacket using large amplitude movement strategies after initial instruction.  Pt demo improvement with repetition and use/min cues for large amplitude movements, particularly trunk rotation.  Pt also instructed in techniques for clothing adjustments/alignment with donning/doffing jacket and donning pull-over shirt.  Simulated ADLs with bag with focus/min cues for large amplitude movements:  donning/doffing pants, pulling shirt down in back, drying back.   Functional mobility:   Sit>stand with  min cues and repetitionfor large amplitude movement technique.  OT Education - 12/12/15 1939    Education Details Large amplitude movement strategies for ADLs/IADLs; importance of ADL strategies to prevent future complications (improve rigidity, prevent injury, decr risk of falls, incr ease)   Person(s) Educated Patient   Methods Explanation;Demonstration;Verbal cues;Handout   Comprehension Verbalized understanding;Returned demonstration;Verbal cues required          OT Short Term Goals - 12/12/15 1951      OT SHORT TERM GOAL #1   Title Pt  will be independent with PD specific HEP (All STG's due 11/29/15)    Time 4   Period Weeks   Status Achieved     OT SHORT TERM GOAL #2   Title Pt will write at least 3 sentences with no significant decr in size maintaining legibility   Time 4   Period Weeks   Status Achieved  12/05/15:  met     OT SHORT TERM GOAL #3   Title Pt will verbalize understanding of ways to prevent future PD related complications and appropriate community resources prn   Time 4   Period Weeks   Status On-going     OT SHORT TERM GOAL #4   Title Pt to hook/unhook 3 buttons for UE dressing in 28 sec. or less   Baseline eval = 33.32 sec   Time 4   Period Weeks   Status On-going  33.32sec     OT SHORT TERM GOAL #5   Title Pt will perform PPT #4 in 23 sec. or less for donning/doffing jacket   Baseline eval = 28.32 sec.    Time 4   Period Weeks   Status Achieved  16 sec.            OT Long Term Goals - 12/12/15 1951      OT LONG TERM GOAL #1   Title Pt will verbalize understanding of adaptive strategies to incr ease with ADL's/IADLS's (All LTG's due 12/29/15)   Time 8   Period Weeks   Status On-going     OT LONG TERM GOAL #2   Title Pt will verbalize understanding of ways to keep thinking skills sharp, compensate for memory changes in the future, and increase awareness of PD complications/deficits   Time 8   Period Weeks   Status On-going     OT LONG TERM GOAL #3   Title Pt will hook and unhook 3 buttons for incr ease with UE dressing in 24 sec. or less   Baseline eval = 33.32 sec   Time 8   Period Weeks   Status On-going     OT LONG TERM GOAL #4   Title Pt will perform PPT #4 in 18 sec. or less for increased ease with donning/doffing jacket   Baseline eval = 28.32 sec.    Time 8   Period Weeks   Status Achieved  16s ec     OT LONG TERM GOAL #5   Title TBD - appropriate standing functional reach goal prn    Time 8   Period Weeks   Status Deferred  16" bilaterally                Plan - 12/12/15 1944    Clinical Impression Statement Pt continues to make progress towards goals and demo improved rigidity and movement amplitude.  Pt would benefit from further occupational therapy to reinforce large amplitude movement strategies for ADLs to incr carryover/calibration and prevent future complications.   Rehab Potential Good   OT Frequency 2x /  week   OT Duration 8 weeks  +eval   OT Treatment/Interventions Self-care/ADL training;Moist Heat;DME and/or AE instruction;Patient/family education;Therapeutic exercises;Therapeutic activities;Neuromuscular education;Functional Mobility Training;Passive range of motion;Manual Therapy;Cognitive remediation/compensation;Visual/perceptual remediation/compensation;Energy conservation   Plan reinforce large amplitude movement strategies for ADLs, dual task activities, continue to address unmet goals, continue to educate in appropriate resources   OT Home Exercise Plan 11/14/15: PWR! Seated; PWR! supine; 11/19/15  PWR! hands; 11/25/15 coordination HEP. 11/28/15: PWR! Quadraped. 12/03/15: additional coordination HEP; PWR! moves in prone and standing, PD ex. chart 12/05/15    Consulted and Agree with Plan of Care Patient      Patient will benefit from skilled therapeutic intervention in order to improve the following deficits and impairments:  Decreased coordination, Difficulty walking, Impaired flexibility, Improper body mechanics, Decreased endurance, Decreased safety awareness, Improper spinal/pelvic alignment, Decreased knowledge of precautions, Decreased activity tolerance, Impaired tone, Decreased balance, Decreased knowledge of use of DME, Impaired UE functional use, Decreased cognition, Decreased mobility, Impaired vision/preception, Impaired perceived functional ability  Visit Diagnosis: Other symptoms and signs involving the musculoskeletal system  Other symptoms and signs involving the nervous system  Stiffness of  right shoulder, not elsewhere classified  Stiffness of left shoulder, not elsewhere classified  Other lack of coordination  Abnormal posture  Other abnormalities of gait and mobility      G-Codes - 12/20/15 1953    Functional Assessment Tool Used PPT #4 = 16.0 sec., 3 button test = 33.32 sec.    Functional Limitation Self care   Self Care Current Status [phone]) At least 1 percent but less than 20 percent impaired, limited or restricted   Self Care Goal Status ([id number]) At least 1 percent but less than 20 percent impaired, limited or restricted     Occupational Therapy Progress Note  Dates of Reporting Period: 10/29/15 to 2015-12-20  Objective Reports of Subjective Statement: see above  Objective Measurements: see above  Goal Update: see above  Plan:  See above  Reason Skilled Services are Required: see above    Problem List Patient Active Problem List   Diagnosis Date Noted  . Parkinson disease [facility])     [facility] Dec 20, 2015, 7:55 PM  [facility] [address] [place], [name], [address] Phone: [phone]   Fax:  [phone]  Name: Egidio Lofgren MRN: [id number] Date of Birth: Aug 16, 1945   Vianne Bulls, OTR/L [facility] [address] [place], [place]  [address] [phone] phone [phone] 12-20-15 7:55 PM

## 2015-12-18 ENCOUNTER — Ambulatory Visit: Payer: Medicare Other

## 2015-12-18 DIAGNOSIS — R471 Dysarthria and anarthria: Secondary | ICD-10-CM

## 2015-12-18 DIAGNOSIS — R293 Abnormal posture: Secondary | ICD-10-CM | POA: Diagnosis not present

## 2015-12-18 DIAGNOSIS — R278 Other lack of coordination: Secondary | ICD-10-CM | POA: Diagnosis not present

## 2015-12-18 DIAGNOSIS — R29818 Other symptoms and signs involving the nervous system: Secondary | ICD-10-CM | POA: Diagnosis not present

## 2015-12-18 DIAGNOSIS — M25611 Stiffness of right shoulder, not elsewhere classified: Secondary | ICD-10-CM | POA: Diagnosis not present

## 2015-12-18 DIAGNOSIS — R29898 Other symptoms and signs involving the musculoskeletal system: Secondary | ICD-10-CM | POA: Diagnosis not present

## 2015-12-18 DIAGNOSIS — M25612 Stiffness of left shoulder, not elsewhere classified: Secondary | ICD-10-CM | POA: Diagnosis not present

## 2015-12-18 NOTE — Therapy (Signed)
[facility] [address] [place], [name], [address] Phone: [phone]   Fax:  [phone]  Speech Language Pathology Treatment  Patient Details  Name: Ronald Noble MRN: [id number] Date of Birth: [birth year] Referring Provider: Dr. Star Age  Encounter Date: 12/18/2015      End of Session - 12/18/15 0926    Visit Number 11   Number of Visits 17   Date for SLP Re-Evaluation 12/24/15   SLP Start Time 0807  pt five minutes late   SLP Stop Time  0846   SLP Time Calculation (min) 39 min   Activity Tolerance Patient tolerated treatment well      Past Medical History:  Diagnosis Date  . BPH (benign prostatic hyperplasia)   . Parkinson disease ([place])   . Skin cancer of face    arms and back  . Sleep apnea    mild  . Spider bite    brown recluse/ left ankle/08/13    Past Surgical History:  Procedure Laterality Date  . BURR HOLE W/ STEREOTACTIC INSERTION OF DBS LEADS / INTRAOP MICROELECTRODE RECORDING    . COLONOSCOPY    . KNEE SURGERY     exploratory/left  . SKIN CANCER EXCISION  01/2014   face  . TRANSURETHRAL RESECTION OF PROSTATE     2008    There were no vitals filed for this visit.      Subjective Assessment - 12/18/15 0818    Subjective "It's more natural for me."               ADULT SLP TREATMENT - 12/18/15 0821      General Information   Behavior/Cognition Alert;Cooperative;Pleasant mood     Treatment Provided   Treatment provided Cognitive-Linquistic     Pain Assessment   Pain Assessment No/denies pain     Cognitive-Linquistic Treatment   Treatment focused on Dysarthria   Skilled Treatment To recalibrate conversational loudness, pt produced loud /a/ with average 94dB. In conversation of 20 minutes with simple topics, pt maintained 70dB average wihtout difficulty. In 15 minute conversation of simple an dmod complex conversation pt was also able to maintain 70dB  average.     Assessment / Recommendations / Plan   Plan Continue with current plan of care     Progression Toward Goals   Progression toward goals Progressing toward goals            SLP Short Term Goals - 12/12/15 0932      SLP SHORT TERM GOAL #1   Title Pt will average 93dB on loud /a/ over 3 sessions with rare min A   Status Achieved     SLP SHORT TERM GOAL #2   Title Pt will average 72dB on structured speech tasks with rare min A   Status Achieved     SLP SHORT TERM GOAL #3   Title Pt will maintain 70dB over 8 minute simple conversation with occasional min A   Time 1   Period Weeks   Status Achieved          SLP Long Term Goals - 12/18/15 [id number]      SLP LONG TERM GOAL #1   Title Pt will maintain 70dB over 15 minute mod complex conversation with rare min A   Time 3   Period Weeks   Status Revised     SLP LONG TERM GOAL #2   Title Pt will maintain audible/intelligible speech in noisy environment outside of tx room  over 12 minutes with rare min A   Time 3   Period Weeks   Status On-going          Plan - 12/18/15 [id number]    Clinical Impression Statement Pt able to hold conversation today for 15 minutes with mod complex topics. Pt will likely be ready for d/c in approx 2 weeks.   Speech Therapy Frequency 2x / week   Duration --  3 weeks   Treatment/Interventions Compensatory strategies;Oral motor exercises;Functional tasks;Multimodal communcation approach;Patient/family education;SLP instruction and feedback;Internal/external aids   Potential to Achieve Goals Good      Patient will benefit from skilled therapeutic intervention in order to improve the following deficits and impairments:   Dysarthria and anarthria    Problem List Patient Active Problem List   Diagnosis Date Noted  . Parkinson disease ([place])     [facility] ,[place], [place]  12/18/2015, 9:30 AM  [facility] [address] [place], [name], [address] Phone: [phone]   Fax:  [phone]   Name: Ronald Noble MRN: [id number] Date of Birth: 1946/01/28

## 2015-12-20 ENCOUNTER — Ambulatory Visit: Payer: Medicare Other

## 2015-12-20 DIAGNOSIS — R29818 Other symptoms and signs involving the nervous system: Secondary | ICD-10-CM | POA: Diagnosis not present

## 2015-12-20 DIAGNOSIS — R29898 Other symptoms and signs involving the musculoskeletal system: Secondary | ICD-10-CM | POA: Diagnosis not present

## 2015-12-20 DIAGNOSIS — M25611 Stiffness of right shoulder, not elsewhere classified: Secondary | ICD-10-CM | POA: Diagnosis not present

## 2015-12-20 DIAGNOSIS — R471 Dysarthria and anarthria: Secondary | ICD-10-CM

## 2015-12-20 DIAGNOSIS — R293 Abnormal posture: Secondary | ICD-10-CM | POA: Diagnosis not present

## 2015-12-20 DIAGNOSIS — R278 Other lack of coordination: Secondary | ICD-10-CM | POA: Diagnosis not present

## 2015-12-20 DIAGNOSIS — M25612 Stiffness of left shoulder, not elsewhere classified: Secondary | ICD-10-CM | POA: Diagnosis not present

## 2015-12-20 NOTE — Therapy (Signed)
[facility] [address] [place], [name], [address] Phone: [phone]   Fax:  [phone]  Speech Language Pathology Treatment  Patient Details  Name: Ronald Noble MRN: [id number] Date of Birth: [birth year] Referring Provider: Dr. Star Age  Encounter Date: 12/20/2015      End of Session - 12/20/15 0811    Visit Number 12   Number of Visits 17   Date for SLP Re-Evaluation 12/24/15   SLP Start Time 0808  pt 7 minutes late   SLP Stop Time  0846   SLP Time Calculation (min) 38 min   Activity Tolerance Patient tolerated treatment well      Past Medical History:  Diagnosis Date  . BPH (benign prostatic hyperplasia)   . Parkinson disease ([place])   . Skin cancer of face    arms and back  . Sleep apnea    mild  . Spider bite    brown recluse/ left ankle/08/13    Past Surgical History:  Procedure Laterality Date  . BURR HOLE W/ STEREOTACTIC INSERTION OF DBS LEADS / INTRAOP MICROELECTRODE RECORDING    . COLONOSCOPY    . KNEE SURGERY     exploratory/left  . SKIN CANCER EXCISION  01/2014   face  . TRANSURETHRAL RESECTION OF PROSTATE     2008    There were no vitals filed for this visit.             ADULT SLP TREATMENT - 12/20/15 0819      General Information   Behavior/Cognition Alert;Cooperative;Pleasant mood     Treatment Provided   Treatment provided Cognitive-Linquistic     Pain Assessment   Pain Assessment No/denies pain     Cognitive-Linquistic Treatment   Treatment focused on Dysarthria   Skilled Treatment To recalibrate conversational loudness, pt produced loud /a/ with average 93dB. In conversation of 10 minutes with simple topics pt maintained 70dB average with rare min A. In 10 minute conversation of simple and mod complex conversation pt was also able to maintain 70dB average with rare min A.     Assessment / Recommendations / Plan   Plan Continue with current plan of  care     Progression Toward Goals   Progression toward goals Progressing toward goals            SLP Short Term Goals - 12/12/15 0932      SLP SHORT TERM GOAL #1   Title Pt will average 93dB on loud /a/ over 3 sessions with rare min A   Status Achieved     SLP SHORT TERM GOAL #2   Title Pt will average 72dB on structured speech tasks with rare min A   Status Achieved     SLP SHORT TERM GOAL #3   Title Pt will maintain 70dB over 8 minute simple conversation with occasional min A   Time 1   Period Weeks   Status Achieved          SLP Long Term Goals - 12/20/15 0910      SLP LONG TERM GOAL #1   Title Pt will maintain 70dB over 15 minute mod complex conversation with rare min A   Time 3   Period Weeks   Status Revised     SLP LONG TERM GOAL #2   Title Pt will maintain audible/intelligible speech in noisy environment outside of tx room over 12 minutes with rare min A, over three sessions   Baseline one session  12-20-15   Time 3   Period Weeks   Status Revised          Plan - 12/20/15 0844    Clinical Impression Statement Pt able to hold conversation today for 10 minutes x2 with 70dB average, rare min A, outside of therapy room. Pt will likely be ready for d/c in approx 2 weeks.   Speech Therapy Frequency 2x / week   Duration --  3 weeks   Treatment/Interventions Compensatory strategies;Oral motor exercises;Functional tasks;Multimodal communcation approach;Patient/family education;SLP instruction and feedback;Internal/external aids   Potential to Achieve Goals Good      Patient will benefit from skilled therapeutic intervention in order to improve the following deficits and impairments:   Dysarthria and anarthria    Problem List Patient Active Problem List   Diagnosis Date Noted  . Parkinson disease ([place])     [facility] ,[place], [employer]  12/20/2015, 9:15 AM  [facility] [address] [place], [name], [address] Phone: [phone]   Fax:  [phone]   Name: Ronald Noble MRN: [id number] Date of Birth: May 29, 1945

## 2015-12-23 ENCOUNTER — Ambulatory Visit: Payer: Medicare Other | Admitting: Occupational Therapy

## 2015-12-23 ENCOUNTER — Ambulatory Visit: Payer: Medicare Other

## 2015-12-23 DIAGNOSIS — R29898 Other symptoms and signs involving the musculoskeletal system: Secondary | ICD-10-CM

## 2015-12-23 DIAGNOSIS — R29818 Other symptoms and signs involving the nervous system: Secondary | ICD-10-CM

## 2015-12-23 DIAGNOSIS — R471 Dysarthria and anarthria: Secondary | ICD-10-CM

## 2015-12-23 DIAGNOSIS — R293 Abnormal posture: Secondary | ICD-10-CM

## 2015-12-23 DIAGNOSIS — R41840 Attention and concentration deficit: Secondary | ICD-10-CM

## 2015-12-23 DIAGNOSIS — M25612 Stiffness of left shoulder, not elsewhere classified: Secondary | ICD-10-CM | POA: Diagnosis not present

## 2015-12-23 DIAGNOSIS — M25611 Stiffness of right shoulder, not elsewhere classified: Secondary | ICD-10-CM | POA: Diagnosis not present

## 2015-12-23 DIAGNOSIS — R278 Other lack of coordination: Secondary | ICD-10-CM

## 2015-12-23 DIAGNOSIS — R2689 Other abnormalities of gait and mobility: Secondary | ICD-10-CM

## 2015-12-23 NOTE — Therapy (Signed)
[facility] [address] [place], [name], [address] Phone: [phone]   Fax:  [phone]  Occupational Therapy Treatment  Patient Details  Name: Ronald Noble MRN: [id number] Date of Birth: 1946/01/24 Referring Provider: Dr. Star Age  Encounter Date: 12/23/2015      OT End of Session - 12/23/15 0848    Visit Number 11   Number of Visits 17   Date for OT Re-Evaluation 12/29/15   Authorization Type MCR 1*, BC/BS 2* - G Code needed   Authorization - Visit Number 11   Authorization - Number of Visits 20   OT Start Time 9678   OT Stop Time [id number]   OT Time Calculation (min) 48 min   Activity Tolerance Patient tolerated treatment well   Behavior During Therapy [facility] for tasks assessed/performed      Past Medical History:  Diagnosis Date  . BPH (benign prostatic hyperplasia)   . Parkinson disease ([place])   . Skin cancer of face    arms and back  . Sleep apnea    mild  . Spider bite    brown recluse/ left ankle/08/13    Past Surgical History:  Procedure Laterality Date  . BURR HOLE W/ STEREOTACTIC INSERTION OF DBS LEADS / INTRAOP MICROELECTRODE RECORDING    . COLONOSCOPY    . KNEE SURGERY     exploratory/left  . SKIN CANCER EXCISION  01/2014   face  . TRANSURETHRAL RESECTION OF PROSTATE     2008    There were no vitals filed for this visit.      Subjective Assessment - 12/23/15 0853    Subjective  Pt reports that he stopped his urinary medication and so he was up a lot last night with frequency.  Pt reports that exercises are going well at home.  "Twisting definately makes it easier (to get jacket off)"   Pertinent History PD since 2008   Limitations DBS, falls, orthostatic hypotension   Patient Stated Goals increase handwriting   Currently in Pain? No/denies  stiffness lower back   Pain Onset More than a month ago        Neuro re-ed:  PWR! Moves (up, rock, twist) in supine and prone x  10-20 each with min cues For incr movement amplitude/sequence.  Arm bike x79mn level 1 for reciprocal movement with cues/target of at least 40rpms for intensity while maintaining movement amplitude/reciprocal movement while performing category generation with min difficulty/cues for dual task and maintaining speed.   Pt maintained 38-53rpms.   PWR! Multi-directional Movements:    Stepping to given sequence with min difficulty/cues for dual task/cognitive component.   Then multi-directional reaching to given sequence with min difficulty/cues for dual task/cognitive component.   Followed by multi-directional reaching with stepping with min difficulty/cues for dual task/cognitive component.     Then repeated reaching and stepping while calling out given colors with min difficulties/cues for dual task/additional cognitive component.  Issued for HEP.                           OT Education - 12/23/15 0900    Education Details Ways to keep thinking skills sharp/decr cognitive complications; Appropriate Community Exercise; [place] Resource/upcoming events list   Person(s) Educated Patient   Methods Explanation;Demonstration   Comprehension Verbalized understanding;Returned demonstration          OT Short Term Goals - 12/23/15 0902      OT SHORT TERM GOAL #  1   Title Pt will be independent with PD specific HEP (All STG's due 11/29/15)    Time 4   Period Weeks   Status Achieved     OT SHORT TERM GOAL #2   Title Pt will write at least 3 sentences with no significant decr in size maintaining legibility   Time 4   Period Weeks   Status Achieved  12/05/15:  met     OT SHORT TERM GOAL #3   Title Pt will verbalize understanding of ways to prevent future PD related complications and appropriate community resources prn   Time 4   Period Weeks   Status Achieved     OT SHORT TERM GOAL #4   Title Pt to hook/unhook 3 buttons for UE dressing in 28 sec. or less   Baseline  eval = 33.32 sec   Time 4   Period Weeks   Status On-going  33.32sec     OT SHORT TERM GOAL #5   Title Pt will perform PPT #4 in 23 sec. or less for donning/doffing jacket   Baseline eval = 28.32 sec.    Time 4   Period Weeks   Status Achieved  16 sec.            OT Long Term Goals - 12/23/15 0901      OT LONG TERM GOAL #1   Title Pt will verbalize understanding of adaptive strategies to incr ease with ADL's/IADLS's (All LTG's due 12/29/15)   Time 8   Period Weeks   Status Achieved  12/23/15     OT LONG TERM GOAL #2   Title Pt will verbalize understanding of ways to keep thinking skills sharp, compensate for memory changes in the future, and increase awareness of PD complications/deficits   Time 8   Period Weeks   Status Achieved  12/23/15     OT LONG TERM GOAL #3   Title Pt will hook and unhook 3 buttons for incr ease with UE dressing in 24 sec. or less   Baseline eval = 33.32 sec   Time 8   Period Weeks   Status On-going     OT LONG TERM GOAL #4   Title Pt will perform PPT #4 in 18 sec. or less for increased ease with donning/doffing jacket   Baseline eval = 28.32 sec.    Time 8   Period Weeks   Status Achieved  16s ec     OT LONG TERM GOAL #5   Title TBD - appropriate standing functional reach goal prn    Time 8   Period Weeks   Status Deferred  16" bilaterally               Plan - 12/23/15 0939    Clinical Impression Statement Pt has made good progress towards goals and verbalized understanding of education provided.  Pt demo less dystonia posturing of hands today.  Plan to d/c next session.   Rehab Potential Good   OT Frequency 2x / week   OT Duration 8 weeks  +eval   OT Treatment/Interventions Self-care/ADL training;Moist Heat;DME and/or AE instruction;Patient/family education;Therapeutic exercises;Therapeutic activities;Neuromuscular education;Functional Mobility Training;Passive range of motion;Manual Therapy;Cognitive  remediation/compensation;Visual/perceptual remediation/compensation;Energy conservation   Plan check remaining goals (buttoning), PWR! flow/review education and d/c next session; schedule screens in approx 6-7 months   OT Home Exercise Plan 11/14/15: PWR! Seated; PWR! supine; 11/19/15  PWR! hands; 11/25/15 coordination HEP. 11/28/15: PWR! Quadraped. 12/03/15: additional coordination HEP; PWR! moves in prone  and standing, PD ex. chart 12/05/15    Consulted and Agree with Plan of Care Patient      Patient will benefit from skilled therapeutic intervention in order to improve the following deficits and impairments:  Decreased coordination, Difficulty walking, Impaired flexibility, Improper body mechanics, Decreased endurance, Decreased safety awareness, Improper spinal/pelvic alignment, Decreased knowledge of precautions, Decreased activity tolerance, Impaired tone, Decreased balance, Decreased knowledge of use of DME, Impaired UE functional use, Decreased cognition, Decreased mobility, Impaired vision/preception, Impaired perceived functional ability  Visit Diagnosis: Other symptoms and signs involving the musculoskeletal system  Other symptoms and signs involving the nervous system  Stiffness of right shoulder, not elsewhere classified  Stiffness of left shoulder, not elsewhere classified  Other lack of coordination  Abnormal posture  Other abnormalities of gait and mobility  Attention and concentration deficit    Problem List Patient Active Problem List   Diagnosis Date Noted  . Parkinson disease ([place])     [facility] 12/23/2015, 10:08 AM  [facility] [address] [place], [name], [address] Phone: [phone]   Fax:  [phone]  Name: Ronald Noble MRN: [id number] Date of Birth: 1945-07-18

## 2015-12-23 NOTE — Patient Instructions (Signed)
Keeping Thinking Skills Sharp: 1. Jigsaw puzzles 2. Card/board games 3. Talking on the phone/social events 4. Lumosity.com 5. Online games 6. Word serches/crossword puzzles 7.  Logic puzzles 8. Aerobic exercise (stationary bike) 9. Eating balanced diet (fruits & veggies) 10. Drink water 11. Try something new--new recipe, hobby 12. Crafts 13. Do a variety of activities that are challenging 14. Add cognitive activities to walking/exercising.  Add the following activities to your walking or when using stationary bike for increased difficulty/challenge.  (Try not to stop while thinking):   -  Name a food, city, country, Higher education careers adviser, Social research officer, government. With each letter of the alphabet starting with A-Z  - Count backwards by 3, 4, 7, etc.  - Name as many items as you can in a category (example:  Name 20 vegetables, name 15 things that are yellow)   Also for increased difficulty:  Try multi-directional stepping with different colors or step over obstacles  **Do not add cognitive activities to exercise if your movement size decreases

## 2015-12-23 NOTE — Patient Instructions (Signed)
  Please complete the assigned speech therapy homework and return it to your next session.  

## 2015-12-23 NOTE — Therapy (Signed)
[facility] [address] [place], [name], [address] Phone: [phone]   Fax:  [phone]  Speech Language Pathology Treatment  Patient Details  Name: Ronald Noble MRN: [id number] Date of Birth: [birth year] Referring Provider: Dr. Star Age  Encounter Date: 12/23/2015      End of Session - 12/23/15 1139    Visit Number 13   Number of Visits 17   Date for SLP Re-Evaluation 12/24/15   SLP Start Time 0807  pt 5 minutes late   SLP Stop Time  0846   SLP Time Calculation (min) 39 min      Past Medical History:  Diagnosis Date  . BPH (benign prostatic hyperplasia)   . Parkinson disease ([place])   . Skin cancer of face    arms and back  . Sleep apnea    mild  . Spider bite    brown recluse/ left ankle/08/13    Past Surgical History:  Procedure Laterality Date  . BURR HOLE W/ STEREOTACTIC INSERTION OF DBS LEADS / INTRAOP MICROELECTRODE RECORDING    . COLONOSCOPY    . KNEE SURGERY     exploratory/left  . SKIN CANCER EXCISION  01/2014   face  . TRANSURETHRAL RESECTION OF PROSTATE     2008    There were no vitals filed for this visit.      Subjective Assessment - 12/23/15 0813    Subjective "My posture really affects my loudness."               ADULT SLP TREATMENT - 12/23/15 0814      General Information   Behavior/Cognition Alert;Cooperative;Pleasant mood     Treatment Provided   Treatment provided Cognitive-Linquistic     Pain Assessment   Pain Assessment No/denies pain     Cognitive-Linquistic Treatment   Treatment focused on Dysarthria   Skilled Treatment SLP used loud /a/ to recalibrate loudness at average 92dB. Pt was talking in terms of decibel level so SLP shared rationale for focus on loudness/effort. Pt was softer in both conversation adn loud /a/ and pt admitted he only id loud /a/ once/day over the weekend. SLP encouraged him to complete loud /a/ twice a day. In  strucutred multiple sentence tasks pt maintained 70dB approx 80% of the time with rare min A. In multiple length simple conversations of 3-7 minutes x3, pt averaged 68dB with rare min A for loudness. SLP told pt that decr in volume level may have to do with being less regimented about completing loud /a/ x2/day.     Assessment / Recommendations / Plan   Plan Continue with current plan of care     Progression Toward Goals   Progression toward goals Progressing toward goals            SLP Short Term Goals - 12/12/15 0932      SLP SHORT TERM GOAL #1   Title Pt will average 93dB on loud /a/ over 3 sessions with rare min A   Status Achieved     SLP SHORT TERM GOAL #2   Title Pt will average 72dB on structured speech tasks with rare min A   Status Achieved     SLP SHORT TERM GOAL #3   Title Pt will maintain 70dB over 8 minute simple conversation with occasional min A   Time 1   Period Weeks   Status Achieved          SLP Long Term Goals - 12/23/15 1143  SLP LONG TERM GOAL #1   Title Pt will maintain 70dB over 15 minute mod complex conversation with rare min A over three therapy sessions   Time 4  renewed 12-23-15   Period Weeks   Status On-going     SLP LONG TERM GOAL #2   Title Pt will maintain audible/intelligible speech in noisy environment outside of tx room over 12 minutes with rare min A, over three sessions   Baseline one session 12-20-15   Time 4  renewed 12-23-15   Period Weeks   Status Revised          Plan - 12/23/15 1140    Clinical Impression Statement Pt's loudness average is decreasing, unknown at this time whether due to becoming more lax on loud /a/, or that and incr'd/variable fatigue levels from day to day. SLP told pt he needed to focus on completing loud /a/ twice a day routinely. See goal update for pt's current status with long and short term goals. SLP thinks 4 more weeks of therapy would assist pt to routinely use and habitualize louder  speech for conversations and for work.    Speech Therapy Frequency 2x / week   Duration 4 weeks   Treatment/Interventions Compensatory strategies;Functional tasks;Multimodal communcation approach;Patient/family education;SLP instruction and feedback;Internal/external aids   Potential to Achieve Goals Good   Consulted and Agree with Plan of Care Patient      Patient will benefit from skilled therapeutic intervention in order to improve the following deficits and impairments:   Dysarthria and anarthria    Problem List Patient Active Problem List   Diagnosis Date Noted  . Parkinson disease ([place])     [facility], [place]  12/23/2015, 11:45 AM  [facility] [address] [place], [name], [address] Phone: [phone]   Fax:  [phone]   Name: Ronald Noble MRN: [id number] Date of Birth: 14-Jun-1945

## 2015-12-26 ENCOUNTER — Ambulatory Visit: Payer: Medicare Other | Admitting: *Deleted

## 2015-12-26 ENCOUNTER — Ambulatory Visit: Payer: Medicare Other | Admitting: Occupational Therapy

## 2015-12-26 DIAGNOSIS — R293 Abnormal posture: Secondary | ICD-10-CM | POA: Diagnosis not present

## 2015-12-26 DIAGNOSIS — R29898 Other symptoms and signs involving the musculoskeletal system: Secondary | ICD-10-CM

## 2015-12-26 DIAGNOSIS — M25611 Stiffness of right shoulder, not elsewhere classified: Secondary | ICD-10-CM | POA: Diagnosis not present

## 2015-12-26 DIAGNOSIS — M25612 Stiffness of left shoulder, not elsewhere classified: Secondary | ICD-10-CM

## 2015-12-26 DIAGNOSIS — R29818 Other symptoms and signs involving the nervous system: Secondary | ICD-10-CM

## 2015-12-26 DIAGNOSIS — R278 Other lack of coordination: Secondary | ICD-10-CM

## 2015-12-26 DIAGNOSIS — R2689 Other abnormalities of gait and mobility: Secondary | ICD-10-CM

## 2015-12-26 DIAGNOSIS — R471 Dysarthria and anarthria: Secondary | ICD-10-CM

## 2015-12-26 NOTE — Therapy (Signed)
[facility] [address] [place], [name], [address] Phone: [phone]   Fax:  [phone]  Occupational Therapy Treatment  Patient Details  Name: Ronald Noble MRN: [id number] Date of Birth: [birth year] Referring Provider: Dr. Star Age  Encounter Date: 12/26/2015      OT End of Session - 12/26/15 1726    Visit Number 12   Number of Visits 17   Date for OT Re-Evaluation 12/29/15   Authorization Type MCR 1*, BC/BS 2* - G Code needed   Authorization - Visit Number 12   Authorization - Number of Visits 20   OT Start Time 7622   OT Stop Time 1530   OT Time Calculation (min) 39 min   Activity Tolerance Patient tolerated treatment well   Behavior During Therapy [facility] for tasks assessed/performed      Past Medical History:  Diagnosis Date  . BPH (benign prostatic hyperplasia)   . Parkinson disease ([place])   . Skin cancer of face    arms and back  . Sleep apnea    mild  . Spider bite    brown recluse/ left ankle/08/13    Past Surgical History:  Procedure Laterality Date  . BURR HOLE W/ STEREOTACTIC INSERTION OF DBS LEADS / INTRAOP MICROELECTRODE RECORDING    . COLONOSCOPY    . KNEE SURGERY     exploratory/left  . SKIN CANCER EXCISION  01/2014   face  . TRANSURETHRAL RESECTION OF PROSTATE     2008    There were no vitals filed for this visit.      Subjective Assessment - 12/26/15 1724    Subjective  Pt reports normal soreness.   Pertinent History PD since 2008   Limitations DBS, falls, orthostatic hypotension   Patient Stated Goals increase handwriting   Currently in Pain? --  normal soreness lower back       Checked remaining goals and discussed progress (see goals section).  Emphasized importance of ex and posture to prevent future complications.  Reviewed use of PD ex chart/flow sheet.  Instructed in indications for additional therapy.  Pt verbalized understanding.  Flipping cards with  each hand with focus on large amplitude and full finger ext with release.    Standing at wall, AAROM lateral reach and step to each side with min v.c. For large amplitude.  In standing, functional reaching to targets across body with wt. Shift and trunk rotation with min cues for large amplitude and base of support.  In supine with noodle positioned along spine for stretch with shoulders in abduction and then full flex and ER as able with min cues.  In standing at wall, full shoulder flex with back against wall for improved posture and decr shoulder rigidity.                       OT Education - 12/26/15 1725    Education Details Recommendations for screens in approx 6-7 months; discussed progress; Aware In Care Kit; Advised pt not to suddenly go off PD meds and discuss dystonia with neurologist   Person(s) Educated Patient   Methods Explanation   Comprehension Verbalized understanding          OT Short Term Goals - 12/26/15 1728      OT SHORT TERM GOAL #1   Title Pt will be independent with PD specific HEP (All STG's due 11/29/15)    Time 4   Period Weeks   Status Achieved  OT SHORT TERM GOAL #2   Title Pt will write at least 3 sentences with no significant decr in size maintaining legibility   Time 4   Period Weeks   Status Achieved  12/05/15:  met     OT SHORT TERM GOAL #3   Title Pt will verbalize understanding of ways to prevent future PD related complications and appropriate community resources prn   Time 4   Period Weeks   Status Achieved     OT SHORT TERM GOAL #4   Title Pt to hook/unhook 3 buttons for UE dressing in 28 sec. or less   Baseline eval = 33.32 sec   Time 4   Period Weeks   Status Achieved  33.32sec     OT SHORT TERM GOAL #5   Title Pt will perform PPT #4 in 23 sec. or less for donning/doffing jacket   Baseline eval = 28.32 sec.    Time 4   Period Weeks   Status Achieved  16 sec.            OT Long Term Goals -  12/26/15 1513      OT LONG TERM GOAL #1   Title Pt will verbalize understanding of adaptive strategies to incr ease with ADL's/IADLS's (All LTG's due 12/29/15)   Time 8   Period Weeks   Status Achieved  12/23/15     OT LONG TERM GOAL #2   Title Pt will verbalize understanding of ways to keep thinking skills sharp, compensate for memory changes in the future, and increase awareness of PD complications/deficits   Time 8   Period Weeks   Status Achieved  12/23/15     OT LONG TERM GOAL #3   Title Pt will hook and unhook 3 buttons for incr ease with UE dressing in 24 sec. or less   Baseline eval = 33.32 sec   Time 8   Period Weeks   Status Achieved  12/26/15:  22.59sec, (16.34sec while wearing)     OT LONG TERM GOAL #4   Title Pt will perform PPT #4 in 18 sec. or less for increased ease with donning/doffing jacket   Baseline eval = 28.32 sec.    Time 8   Period Weeks   Status Achieved  16s ec     OT LONG TERM GOAL #5   Title TBD - appropriate standing functional reach goal prn    Time 8   Period Weeks   Status Deferred  16" bilaterally               Plan - 12/26/15 1726    Clinical Impression Statement Pt met all STGs/LTGs and demo decr dystonia in hands.   However, pt demo incr dystonia of face/posture than during morning appts.  Pt appropriate to d/c.   Rehab Potential Good   OT Frequency 2x / week   OT Duration 8 weeks  +eval   OT Treatment/Interventions Self-care/ADL training;Moist Heat;DME and/or AE instruction;Patient/family education;Therapeutic exercises;Therapeutic activities;Neuromuscular education;Functional Mobility Training;Passive range of motion;Manual Therapy;Cognitive remediation/compensation;Visual/perceptual remediation/compensation;Energy conservation   Plan d/c; screens in approx 6-36month   OT Home Exercise Plan 11/14/15: PWR! Seated; PWR! supine; 11/19/15  PWR! hands; 11/25/15 coordination HEP. 11/28/15: PWR! Quadraped. 12/03/15: additional  coordination HEP; PWR! moves in prone and standing, PD ex. chart 12/05/15    Consulted and Agree with Plan of Care Patient      Patient will benefit from skilled therapeutic intervention in order to improve the following deficits and impairments:  Decreased coordination, Difficulty walking, Impaired flexibility, Improper body mechanics, Decreased endurance, Decreased safety awareness, Improper spinal/pelvic alignment, Decreased knowledge of precautions, Decreased activity tolerance, Impaired tone, Decreased balance, Decreased knowledge of use of DME, Impaired UE functional use, Decreased cognition, Decreased mobility, Impaired vision/preception, Impaired perceived functional ability  Visit Diagnosis: Other symptoms and signs involving the musculoskeletal system  Stiffness of right shoulder, not elsewhere classified  Other symptoms and signs involving the nervous system  Stiffness of left shoulder, not elsewhere classified  Other lack of coordination  Abnormal posture  Other abnormalities of gait and mobility      G-Codes - 12-30-2015 1728    Functional Assessment Tool Used PPT #4 = 16.0 sec., 3 button test = 22.59sec on table and 16.34sec while wearing.    Functional Limitation Self care   Self Care Goal Status [phone]) At least 1 percent but less than 20 percent impaired, limited or restricted   Self Care Discharge Status [phone]) At least 1 percent but less than 20 percent impaired, limited or restricted      Problem List Patient Active Problem List   Diagnosis Date Noted  . Parkinson disease ([employer] [facility])    OCCUPATIONAL THERAPY DISCHARGE SUMMARY  Visits from Start of Care: 12  Current functional level related to goals / functional outcomes: See above   Remaining deficits: Bradykinesia, rigidity/dystonia, abnormal posture, stiffness in shoulders/hands, decr coordination, decr functional mobility, and decr awareness (all improved)   Education / Equipment: Pt was instructed in the  following:  PD-specific HEP, adaptive strategies for ADLs/IADLs, ways to prevent future complications, appropriate community resources.  Pt verbalized understanding of all education provided.  Plan: Patient agrees to discharge.  Patient goals were met. Patient is being discharged due to meeting the stated rehab goals.  Pt would benefit from occupational therapy screen in approx 6 months to assess for need for further therapy/functional changes due to progressive nature of diagnosis. ?????        [facility] 12/30/2015, 5:29 PM  [facility] [address] [place], [name], [address] Phone: [phone]   Fax:  [phone]  Name: Ronald Noble MRN: [id number] Date of Birth: [birth year]   Vianne Bulls, OTR/L [facility] [address] [place], [place]  [address] [phone] phone [phone] 12/30/15 5:37 PM

## 2015-12-26 NOTE — Therapy (Signed)
[facility] [address] [place], [name], [address] Phone: [phone]   Fax:  [phone]  Speech Language Pathology Treatment  Patient Details  Name: Ronald Noble MRN: [id number] Date of Birth: 09-Apr-1945 Referring Provider: Dr. Star Age  Encounter Date: 12/26/2015      End of Session - 12/26/15 1623    Visit Number 14   Number of Visits 21   Date for SLP Re-Evaluation 02/04/16      Past Medical History:  Diagnosis Date  . BPH (benign prostatic hyperplasia)   . Parkinson disease ([place])   . Skin cancer of face    arms and back  . Sleep apnea    mild  . Spider bite    brown recluse/ left ankle/08/13    Past Surgical History:  Procedure Laterality Date  . BURR HOLE W/ STEREOTACTIC INSERTION OF DBS LEADS / INTRAOP MICROELECTRODE RECORDING    . COLONOSCOPY    . KNEE SURGERY     exploratory/left  . SKIN CANCER EXCISION  01/2014   face  . TRANSURETHRAL RESECTION OF PROSTATE     2008    There were no vitals filed for this visit.      Subjective Assessment - 12/26/15 1543    Subjective "I have to get this on the table, I didn't complete my homework like I should; a lot of dealing with Parkinson's is being consistent"                ADULT SLP TREATMENT - 12/26/15 0001      General Information   Behavior/Cognition Alert;Cooperative;Pleasant mood     Treatment Provided   Treatment provided Cognitive-Linquistic     Pain Assessment   Pain Assessment 0-10   Pain Score 0-No pain     Cognitive-Linquistic Treatment   Treatment focused on Dysarthria   Skilled Treatment SLP used loud /a/ to recalibrate loudness at average 90dB. SLP continued conversation from previous session re: rationale for focus on loudness/effort. Pt maintained 90 dB with loud /a/ and averaged 68 dB in conversation overall with min A, improved with mod A to 70 dB in conversation.  Various structured tasks with  sentence formation yielded 70 dB with min A. SLP continued encouragement for him to complete loud /a/ twice a day.  SLP told pt that decrease in volume level directly relates to rate of speech and lack of using strategies given effectively in conversational turns; pt made better gains when rationale discussed as becoming more of a natural inclination than planned, but to get to this point, he had to practice diligently with strategies as much as possible.     Assessment / Recommendations / Plan   Plan Continue with current plan of care            SLP Short Term Goals - 12/26/15 1629      SLP SHORT TERM GOAL #1   Title Pt will average 93dB on loud /a/ over 3 sessions with rare min A   Status Achieved     SLP SHORT TERM GOAL #2   Title Pt will average 72dB on structured speech tasks with rare min A   Status Achieved     SLP SHORT TERM GOAL #3   Title Pt will maintain 70dB over 8 minute simple conversation with occasional min A   Time 1   Period Weeks   Status Achieved          SLP Long Term Goals -  12/26/15 1633      SLP LONG TERM GOAL #1   Title Pt will maintain 70dB over 15 minute mod complex conversation with rare min A over three therapy sessions   Time 4  renewed 12-23-15   Period Weeks   Status On-going     SLP LONG TERM GOAL #2   Title Pt will maintain audible/intelligible speech in noisy environment outside of tx room over 12 minutes with rare min A, over three sessions   Baseline one session 12-20-15   Time 4  renewed 12-23-15   Period Weeks   Status Revised          Plan - 12/26/15 1624    Clinical Impression Statement Pt's loudness average fluctuates and is unknown at this time whether due to becoming more lax on loud /a/, or that and incr'd/variable fatigue levels from day to day. Suspect some fatigue is d/t nature of his job and inability to practice good vocal hygiene consistently at work.   SLP told pt he needed to focus on completing loud /a/ at least  twice a day routinely. SLP with continued education pt re: habitualizing louder speech for conversations and at work.    Speech Therapy Frequency 2x / week   Duration 4 weeks  (or total 8 more visits)   Treatment/Interventions Compensatory strategies;Functional tasks;Multimodal communcation approach;Patient/family education;SLP instruction and feedback;Internal/external aids   Potential to Achieve Goals Good   Consulted and Agree with Plan of Care Patient      Patient will benefit from skilled therapeutic intervention in order to improve the following deficits and impairments:   Dysarthria and anarthria    Problem List Patient Active Problem List   Diagnosis Date Noted  . Parkinson disease [facility])     Jaiana Sheffer,PAT 12/26/2015, 4:35 PM  [facility] [address] [place], [name], [address] Phone: [phone]   Fax:  [phone]   Name: Ronald Noble MRN: [id number] Date of Birth: 1945/04/11

## 2015-12-30 ENCOUNTER — Ambulatory Visit: Payer: Medicare Other

## 2015-12-30 DIAGNOSIS — R471 Dysarthria and anarthria: Secondary | ICD-10-CM

## 2015-12-30 DIAGNOSIS — R29898 Other symptoms and signs involving the musculoskeletal system: Secondary | ICD-10-CM | POA: Diagnosis not present

## 2015-12-30 DIAGNOSIS — R29818 Other symptoms and signs involving the nervous system: Secondary | ICD-10-CM | POA: Diagnosis not present

## 2015-12-30 DIAGNOSIS — R293 Abnormal posture: Secondary | ICD-10-CM | POA: Diagnosis not present

## 2015-12-30 DIAGNOSIS — M25611 Stiffness of right shoulder, not elsewhere classified: Secondary | ICD-10-CM | POA: Diagnosis not present

## 2015-12-30 DIAGNOSIS — M25612 Stiffness of left shoulder, not elsewhere classified: Secondary | ICD-10-CM | POA: Diagnosis not present

## 2015-12-30 DIAGNOSIS — R278 Other lack of coordination: Secondary | ICD-10-CM | POA: Diagnosis not present

## 2015-12-30 NOTE — Therapy (Signed)
[facility] [address] [place], [name], [address] Phone: [phone]   Fax:  [phone]  Speech Language Pathology Treatment  Patient Details  Name: Ronald Noble MRN: [id number] Date of Birth: 1945-07-05 Referring Provider: Dr. Star Age  Encounter Date: 12/30/2015      End of Session - 12/30/15 0854    Visit Number 15   Number of Visits 21   Date for SLP Re-Evaluation 02/04/16   SLP Start Time 0803   SLP Stop Time  0831  pt requested to leave early due to another appointment   SLP Time Calculation (min) 28 min   Activity Tolerance Patient tolerated treatment well      Past Medical History:  Diagnosis Date  . BPH (benign prostatic hyperplasia)   . Parkinson disease ([place])   . Skin cancer of face    arms and back  . Sleep apnea    mild  . Spider bite    brown recluse/ left ankle/08/13    Past Surgical History:  Procedure Laterality Date  . BURR HOLE W/ STEREOTACTIC INSERTION OF DBS LEADS / INTRAOP MICROELECTRODE RECORDING    . COLONOSCOPY    . KNEE SURGERY     exploratory/left  . SKIN CANCER EXCISION  01/2014   face  . TRANSURETHRAL RESECTION OF PROSTATE     2008    There were no vitals filed for this visit.      Subjective Assessment - 12/30/15 0811    Subjective "We had my grandkids this weekend." "I have to leave at 8:30. I have to be in court by 9."               ADULT SLP TREATMENT - 12/30/15 0812      General Information   Behavior/Cognition Alert;Cooperative;Pleasant mood     Treatment Provided   Treatment provided Cognitive-Linquistic     Pain Assessment   Pain Assessment 0-10   Pain Score 6    Pain Location lower pain   Pain Descriptors / Indicators Sore   Pain Intervention(s) Monitored during session     Cognitive-Linquistic Treatment   Treatment focused on Dysarthria   Skilled Treatment SLP used loud /a/ to recalibrate loudness at average 91dB. In  semi-strucutred multiple sentence tasks pt maintained 70dB approx 70% of the time with rare min A, initially. By the end of the task the patient was independent in maintaining 70dB 75% of the time.      Assessment / Recommendations / Plan   Plan Continue with current plan of care     Progression Toward Goals   Progression toward goals Progressing toward goals            SLP Short Term Goals - 12/26/15 1629      SLP SHORT TERM GOAL #1   Title Pt will average 93dB on loud /a/ over 3 sessions with rare min A   Status Achieved     SLP SHORT TERM GOAL #2   Title Pt will average 72dB on structured speech tasks with rare min A   Status Achieved     SLP SHORT TERM GOAL #3   Title Pt will maintain 70dB over 8 minute simple conversation with occasional min A   Time 1   Period Weeks   Status Achieved          SLP Long Term Goals - 12/30/15 [id number]      SLP LONG TERM GOAL #1   Title Pt will  maintain 70dB over 15 minute mod complex conversation with rare min A over three therapy sessions   Time 3  renewed 12-23-15   Period Weeks   Status On-going     SLP LONG TERM GOAL #2   Title Pt will maintain audible/intelligible speech in noisy environment outside of tx room over 12 minutes with rare min A, over three sessions   Baseline one session 12-20-15   Time 3  renewed 12-23-15   Period Weeks   Status Revised          Plan - 12/30/15 0855    Clinical Impression Statement Pt has been better with loud /a/ completion and loudness was generally better today in simple short conversation as well as semi-structured tasks. Pt had to leave early today due to    Speech Therapy Frequency 2x / week   Duration 4 weeks  3 weeks (or total 7 more visits)   Treatment/Interventions Compensatory strategies;Functional tasks;Multimodal communcation approach;Patient/family education;SLP instruction and feedback;Internal/external aids   Potential to Achieve Goals Good   Consulted and Agree with Plan  of Care Patient      Patient will benefit from skilled therapeutic intervention in order to improve the following deficits and impairments:   Dysarthria and anarthria    Problem List Patient Active Problem List   Diagnosis Date Noted  . Parkinson disease ([place])     [place] ,[place], [place]  12/30/2015, 9:28 AM  [facility] [address] [place], [name], [address] Phone: [phone]   Fax:  [phone]   Name: Hursel Mcnell MRN: [id number] Date of Birth: 1945-08-04

## 2015-12-30 NOTE — Patient Instructions (Signed)
  Please complete the assigned speech therapy homework and return it to your next session.  

## 2016-01-02 ENCOUNTER — Ambulatory Visit: Payer: Medicare Other | Admitting: Speech Pathology

## 2016-01-02 DIAGNOSIS — R29898 Other symptoms and signs involving the musculoskeletal system: Secondary | ICD-10-CM | POA: Diagnosis not present

## 2016-01-02 DIAGNOSIS — R278 Other lack of coordination: Secondary | ICD-10-CM | POA: Diagnosis not present

## 2016-01-02 DIAGNOSIS — M25611 Stiffness of right shoulder, not elsewhere classified: Secondary | ICD-10-CM | POA: Diagnosis not present

## 2016-01-02 DIAGNOSIS — M25612 Stiffness of left shoulder, not elsewhere classified: Secondary | ICD-10-CM | POA: Diagnosis not present

## 2016-01-02 DIAGNOSIS — R29818 Other symptoms and signs involving the nervous system: Secondary | ICD-10-CM | POA: Diagnosis not present

## 2016-01-02 DIAGNOSIS — R471 Dysarthria and anarthria: Secondary | ICD-10-CM

## 2016-01-02 DIAGNOSIS — R293 Abnormal posture: Secondary | ICD-10-CM | POA: Diagnosis not present

## 2016-01-02 NOTE — Patient Instructions (Signed)
    Practice breathing more frequently during 10 minute conversations focusing on breathing and loudness.

## 2016-01-02 NOTE — Therapy (Signed)
[facility] [address] [place], [name], [address] Phone: [phone]   Fax:  [phone]  Speech Language Pathology Treatment  Patient Details  Name: Leelin Waldock MRN: [id number] Date of Birth: [birth year] Referring Provider: Dr. Star Age  Encounter Date: 01/02/2016      End of Session - 01/02/16 0930    Visit Number 16   Number of Visits 21   Date for SLP Re-Evaluation 02/04/16   SLP Start Time 0845   SLP Stop Time  0927   SLP Time Calculation (min) 42 min      Past Medical History:  Diagnosis Date  . BPH (benign prostatic hyperplasia)   . Parkinson disease ([place])   . Skin cancer of face    arms and back  . Sleep apnea    mild  . Spider bite    brown recluse/ left ankle/08/13    Past Surgical History:  Procedure Laterality Date  . BURR HOLE W/ STEREOTACTIC INSERTION OF DBS LEADS / INTRAOP MICROELECTRODE RECORDING    . COLONOSCOPY    . KNEE SURGERY     exploratory/left  . SKIN CANCER EXCISION  01/2014   face  . TRANSURETHRAL RESECTION OF PROSTATE     2008    There were no vitals filed for this visit.      Subjective Assessment - 01/02/16 0851    Subjective "I don't know if I'm improving"               ADULT SLP TREATMENT - 01/02/16 0853      General Information   Behavior/Cognition Alert;Cooperative;Pleasant mood     Treatment Provided   Treatment provided Cognitive-Linquistic     Pain Assessment   Pain Assessment 0-10   Pain Score 1    Pain Location lower back   Pain Descriptors / Indicators Sore   Pain Intervention(s) Monitored during session     Cognitive-Linquistic Treatment   Treatment focused on Dysarthria   Skilled Treatment Loud /a/ averae 93dB to recalibrate loudness. Simple conversation   -  pt averaged 70dB over  20 minute conversation.      Assessment / Recommendations / Plan   Plan Continue with current plan of care     Progression Toward Goals    Progression toward goals Progressing toward goals          SLP Education - 01/02/16 0927    Education provided Yes   Education Details Breath support increases volume - breath more frequently during conversations   Person(s) Educated Patient   Methods Explanation;Handout   Comprehension Verbalized understanding          SLP Short Term Goals - 01/02/16 0929      SLP SHORT TERM GOAL #1   Title Pt will average 93dB on loud /a/ over 3 sessions with rare min A   Status Achieved     SLP SHORT TERM GOAL #2   Title Pt will average 72dB on structured speech tasks with rare min A   Status Achieved     SLP SHORT TERM GOAL #3   Title Pt will maintain 70dB over 8 minute simple conversation with occasional min A   Time 1   Period Weeks   Status Achieved          SLP Long Term Goals - 01/02/16 0930      SLP LONG TERM GOAL #1   Title Pt will maintain 70dB over 15 minute mod complex conversation with rare  min A over three therapy sessions   Baseline 01/02/16   Time 3  renewed 12-23-15   Period Weeks   Status On-going     SLP LONG TERM GOAL #2   Title Pt will maintain audible/intelligible speech in noisy environment outside of tx room over 12 minutes with rare min A, over three sessions   Baseline one session 12-20-15   Time 3  renewed 12-23-15   Period Weeks   Status Revised          Plan - 01/02/16 [id number]    Clinical Impression Statement Pt maintained average volume of 70dB during simple conversations with occasional min a for volume and breath support - continue skilled ST to maximize intelligibility and carryover of volume.    Speech Therapy Frequency 2x / week   Treatment/Interventions Compensatory strategies;Functional tasks;Multimodal communcation approach;Patient/family education;SLP instruction and feedback;Internal/external aids   Potential to Achieve Goals Good   Consulted and Agree with Plan of Care Patient      Patient will benefit from skilled therapeutic  intervention in order to improve the following deficits and impairments:   Dysarthria and anarthria    Problem List Patient Active Problem List   Diagnosis Date Noted  . Parkinson disease ([place])     Kristl Morioka, Annye Rusk MS, CCC-SLP 01/02/2016, 9:30 AM  [facility] [address] [place], [name], [address] Phone: [phone]   Fax:  [phone]   Name: Pearse Kleiber MRN: [id number] Date of Birth: 28-Nov-1945

## 2016-01-06 ENCOUNTER — Ambulatory Visit: Payer: Medicare Other | Attending: Internal Medicine

## 2016-01-06 DIAGNOSIS — R471 Dysarthria and anarthria: Secondary | ICD-10-CM | POA: Insufficient documentation

## 2016-01-06 NOTE — Therapy (Signed)
[facility] [address] [place], [name], [address] Phone: [phone]   Fax:  [phone]  Speech Language Pathology Treatment  Patient Details  Name: Ronald Noble MRN: [id number] Date of Birth: [birth year] Referring Provider: Dr. Star Age  Encounter Date: 01/06/2016      End of Session - 01/06/16 0853    Visit Number 17   Number of Visits 21   Date for SLP Re-Evaluation 02/04/16   SLP Start Time 0810  pt 10 minutes late   SLP Stop Time  0847   SLP Time Calculation (min) 37 min   Activity Tolerance Patient tolerated treatment well      Past Medical History:  Diagnosis Date  . BPH (benign prostatic hyperplasia)   . Parkinson disease ([facility])   . Skin cancer of face    arms and back  . Sleep apnea    mild  . Spider bite    brown recluse/ left ankle/08/13    Past Surgical History:  Procedure Laterality Date  . BURR HOLE W/ STEREOTACTIC INSERTION OF DBS LEADS / INTRAOP MICROELECTRODE RECORDING    . COLONOSCOPY    . KNEE SURGERY     exploratory/left  . SKIN CANCER EXCISION  01/2014   face  . TRANSURETHRAL RESECTION OF PROSTATE     2008    There were no vitals filed for this visit.      Subjective Assessment - 01/06/16 0818    Subjective So I have to think about my "dog voice". (dog voice is loud voice)   Currently in Pain? No/denies               ADULT SLP TREATMENT - 01/06/16 0820      General Information   Behavior/Cognition Alert;Cooperative;Pleasant mood     Treatment Provided   Treatment provided Cognitive-Linquistic     Pain Assessment   Pain Assessment No/denies pain     Cognitive-Linquistic Treatment   Treatment focused on Dysarthria   Skilled Treatment Loud /a/ was used to facilitate  pt's louder WNL vocal volume, average 91dB. Semi-strucutred tasks of multisentence responses with average 72dB. Simple conversation was measured at 69-70dB and SLP req'd to provide  verbal and auditory cues occasionally for increaseing loudenss to >70dB. Thus, re-educated pt about when he thinks "loud" the result is WNL loudness, not "uncomfortable loud".      Assessment / Recommendations / Plan   Plan Continue with current plan of care     Progression Toward Goals   Progression toward goals Progressing toward goals          SLP Education - 01/06/16 0851    Education provided Yes   Education Details Think "loud" in conversation, just like pt does with ST tasks and not overanalyze why he is not in WNL loudness in conversation   Person(s) Educated Patient   Methods Explanation   Comprehension Verbalized understanding          SLP Short Term Goals - 01/02/16 0929      SLP SHORT TERM GOAL #1   Title Pt will average 93dB on loud /a/ over 3 sessions with rare min A   Status Achieved     SLP SHORT TERM GOAL #2   Title Pt will average 72dB on structured speech tasks with rare min A   Status Achieved     SLP SHORT TERM GOAL #3   Title Pt will maintain 70dB over 8 minute simple conversation with occasional min A  Time 1   Period Weeks   Status Achieved          SLP Long Term Goals - 01/06/16 0855      SLP LONG TERM GOAL #1   Title Pt will maintain 70dB over 15 minute mod complex conversation with rare min A over three therapy sessions   Baseline 01/02/16   Time 2  renewed 12-23-15   Period Weeks   Status On-going     SLP LONG TERM GOAL #2   Title Pt will maintain audible/intelligible speech in noisy environment outside of tx room over 12 minutes with rare min A, over three sessions   Baseline one session 12-20-15   Time 2  renewed 12-23-15   Period Weeks   Status Revised          Plan - 01/06/16 0854    Clinical Impression Statement Pt cont to maintain average volume of 70dB during simple conversations with occasional verbal and auditory cues for volume and breath support - continue skilled ST to maximize intelligibility and carryover of  volume.  SLP shated with pt today to aim for same loudness in conversation that he aims for with ST tasks.   Speech Therapy Frequency 2x / week   Duration --  2 weeks   Treatment/Interventions Compensatory strategies;Functional tasks;Multimodal communcation approach;Patient/family education;SLP instruction and feedback;Internal/external aids   Potential to Achieve Goals Good   Consulted and Agree with Plan of Care Patient      Patient will benefit from skilled therapeutic intervention in order to improve the following deficits and impairments:   Dysarthria and anarthria    Problem List Patient Active Problem List   Diagnosis Date Noted  . Parkinson disease ([place])     [place] ,[place], [place]  01/06/2016, 8:58 AM  [facility] [address] [place], [name], [address] Phone: [phone]   Fax:  [phone]   Name: Ronald Noble MRN: [id number] Date of Birth: 11-01-1945

## 2016-01-09 ENCOUNTER — Ambulatory Visit: Payer: Medicare Other

## 2016-01-14 ENCOUNTER — Ambulatory Visit: Payer: Medicare Other

## 2016-01-16 ENCOUNTER — Ambulatory Visit: Payer: Medicare Other

## 2016-01-16 DIAGNOSIS — R471 Dysarthria and anarthria: Secondary | ICD-10-CM | POA: Diagnosis not present

## 2016-01-16 NOTE — Therapy (Signed)
[name] [address] [place], [name], [address] Phone: [phone]   Fax:  [phone]  Speech Language Pathology Treatment  Patient Details  Name: Ronald Noble MRN: [id number] Date of Birth: 1945-09-01 Referring Provider: Dr. Star Age  Encounter Date: 01/16/2016      End of Session - 01/16/16 1402    Visit Number 18   Number of Visits 21   Date for SLP Re-Evaluation 02/04/16   SLP Start Time [id number]   SLP Stop Time  1400   SLP Time Calculation (min) 43 min      Past Medical History:  Diagnosis Date  . BPH (benign prostatic hyperplasia)   . Parkinson disease ([facility])   . Skin cancer of face    arms and back  . Sleep apnea    mild  . Spider bite    brown recluse/ left ankle/08/13    Past Surgical History:  Procedure Laterality Date  . BURR HOLE W/ STEREOTACTIC INSERTION OF DBS LEADS / INTRAOP MICROELECTRODE RECORDING    . COLONOSCOPY    . KNEE SURGERY     exploratory/left  . SKIN CANCER EXCISION  01/2014   face  . TRANSURETHRAL RESECTION OF PROSTATE     2008    There were no vitals filed for this visit.      Subjective Assessment - 01/16/16 1323    Subjective "I had to cancel with you the last two visits - sorry."   Currently in Pain? No/denies               ADULT SLP TREATMENT - 01/16/16 1324      General Information   Behavior/Cognition Alert;Cooperative;Pleasant mood     Treatment Provided   Treatment provided Cognitive-Linquistic     Cognitive-Linquistic Treatment   Treatment focused on Dysarthria   Skilled Treatment Loud /a/ was used to facilitate  pt's louder WNL vocal volume, average 91dB. Semi-strucutred tasks of multisentence responses with average 72dB. Simple- mod complex conversation was measured at [place] in therapy session. Outdoors, pt was intelligible over traffic and car noise around the clinic.      Assessment / Recommendations / Plan   Plan Continue  with current plan of care  suspect d/c next session     Progression Toward Goals   Progression toward goals Progressing toward goals          SLP Education - 01/16/16 1408    Education provided Yes   Education Details think "loud" and not overanalyze every detail needed for intelligible speech   Person(s) Educated Patient   Methods Explanation   Comprehension Verbalized understanding;Returned demonstration          SLP Short Term Goals - 01/02/16 0929      SLP SHORT TERM GOAL #1   Title Pt will average 93dB on loud /a/ over 3 sessions with rare min A   Status Achieved     SLP SHORT TERM GOAL #2   Title Pt will average 72dB on structured speech tasks with rare min A   Status Achieved     SLP SHORT TERM GOAL #3   Title Pt will maintain 70dB over 8 minute simple conversation with occasional min A   Time 1   Period Weeks   Status Achieved          SLP Long Term Goals - 01/16/16 1407      SLP LONG TERM GOAL #1   Title Pt will maintain 70dB over 15  minute mod complex conversation with rare min A over three therapy sessions   Baseline 01/02/16, 01-16-16   Time 1  renewed 12-23-15   Period Weeks   Status On-going     SLP LONG TERM GOAL #2   Title Pt will maintain audible/intelligible speech in noisy environment outside of tx room over 12 minutes with rare min A, over three sessions   Baseline one session 12-20-15, 01-16-16   Time 1  renewed 12-23-15   Period Weeks   Status Revised          Plan - 01/16/16 1403    Clinical Impression Statement Pt maintained WNL average volume during both simple-mod complex conversations and in semistructured tasks. Continue skilled ST suspected for 1-2 more visits, to maximize intelligibility and carryover of volume.  SLP shated with pt today to aim for same loudness in conversation that he aims for with ST tasks.   Speech Therapy Frequency 2x / week   Duration 1 week  2 weeks   Treatment/Interventions Compensatory  strategies;Functional tasks;Multimodal communcation approach;Patient/family education;SLP instruction and feedback;Internal/external aids   Potential to Achieve Goals Good   Consulted and Agree with Plan of Care Patient      Patient will benefit from skilled therapeutic intervention in order to improve the following deficits and impairments:   Dysarthria and anarthria    Problem List Patient Active Problem List   Diagnosis Date Noted  . Parkinson disease ([place])     [place] ,[place], [place]  01/16/2016, 2:10 PM  [facility] [address] [place], [name], [address] Phone: [phone]   Fax:  [phone]   Name: Ronald Noble MRN: [id number] Date of Birth: 03-14-1946

## 2016-01-18 DIAGNOSIS — Z23 Encounter for immunization: Secondary | ICD-10-CM | POA: Diagnosis not present

## 2016-01-24 ENCOUNTER — Encounter: Payer: Medicare Other | Admitting: Speech Pathology

## 2016-01-27 ENCOUNTER — Ambulatory Visit: Payer: Medicare Other

## 2016-01-27 DIAGNOSIS — R471 Dysarthria and anarthria: Secondary | ICD-10-CM

## 2016-01-28 NOTE — Therapy (Signed)
[facility] [address] [place], [name], [address] Phone: [phone]   Fax:  [phone]  Speech Language Pathology Treatment  Patient Details  Name: Ronald Noble MRN: [id number] Date of Birth: 09/20/1945 Referring Provider: Dr. Star Age  Encounter Date: 01/27/2016      End of Session - 01/28/16 1128    Visit Number 19   Number of Visits 21   Date for SLP Re-Evaluation 02/04/16   SLP Start Time 30   SLP Stop Time  1224   SLP Time Calculation (min) 41 min      Past Medical History:  Diagnosis Date  . BPH (benign prostatic hyperplasia)   . Parkinson disease ([place])   . Skin cancer of face    arms and back  . Sleep apnea    mild  . Spider bite    brown recluse/ left ankle/08/13    Past Surgical History:  Procedure Laterality Date  . BURR HOLE W/ STEREOTACTIC INSERTION OF DBS LEADS / INTRAOP MICROELECTRODE RECORDING    . COLONOSCOPY    . KNEE SURGERY     exploratory/left  . SKIN CANCER EXCISION  01/2014   face  . TRANSURETHRAL RESECTION OF PROSTATE     2008    There were no vitals filed for this visit.      Subjective Assessment - 01/27/16 1539    Subjective "I think probably so" pt, re: whether people have less-frequently asked him to repeat since start of ST. "I have to think to talk loudly, and get a good breath."   Currently in Pain? No/denies               ADULT SLP TREATMENT - 01/27/16 1540      General Information   Behavior/Cognition Alert;Cooperative;Pleasant mood     Treatment Provided   Treatment provided Cognitive-Linquistic     Cognitive-Linquistic Treatment   Treatment focused on Dysarthria   Skilled Treatment Loud /a/ was used to facilitate  pt's louder WNL vocal volume, average 93dB. Semi-strucutred tasks of multisentence responses with average 72dB. Simple- mod complex conversation was measured at [place] in therapy session. Outdoors, pt was intelligible  over traffic and car noise around the clinic.      Assessment / Recommendations / Plan   Plan Discharge SLP treatment due to (comment)  met treatment goals          SLP Education - 01/27/16 1636    Education provided Yes   Education Details need to cont with loud /a/ after d/c   Person(s) Educated Patient   Methods Explanation   Comprehension Verbalized understanding          SLP Short Term Goals - 01/02/16 0929      SLP SHORT TERM GOAL #1   Title Pt will average 93dB on loud /a/ over 3 sessions with rare min A   Status Achieved     SLP SHORT TERM GOAL #2   Title Pt will average 72dB on structured speech tasks with rare min A   Status Achieved     SLP SHORT TERM GOAL #3   Title Pt will maintain 70dB over 8 minute simple conversation with occasional min A   Time 1   Period Weeks   Status Achieved          SLP Long Term Goals - 01/27/16 1545      SLP LONG TERM GOAL #1   Title Pt will maintain 70dB over 15 minute mod  complex conversation with rare min A over three therapy sessions   Baseline 01/02/16, 01-16-16, 02-18-16   Time --  renewed 12-23-15   Status Achieved     SLP LONG TERM GOAL #2   Title Pt will maintain audible/intelligible speech in noisy environment outside of tx room over 12 minutes with rare min A, over three sessions   Baseline --   Time --  renewed 12-23-15   Period --   Status Achieved          Plan - 01/28/16 1128    Clinical Impression Statement Pt maintained WNL average volume in semistructured tasks, and appropriate loudness for 12 minute mod complex conversaiton outdoors. Pt stated today he needs to think about being loud and having a good breath (simplified from last week). Pt agreed with SLP tat d/c apprproate at this time.   Duration --  2 weeks   Treatment/Interventions Compensatory strategies;Functional tasks;Multimodal communcation approach;Patient/family education;SLP instruction and feedback;Internal/external aids    Potential to Achieve Goals Good   Consulted and Agree with Plan of Care Patient      Patient will benefit from skilled therapeutic intervention in order to improve the following deficits and impairments:   Dysarthria and anarthria      G-Codes - Feb 18, 2016 1649    Functional Assessment Tool Used NOMS   Functional Limitations Motor speech   Motor Speech Goal Status [phone]) At least 1 percent but less than 20 percent impaired, limited or restricted   Motor Speech Goal Status (B2841) At least 1 percent but less than 20 percent impaired, limited or restricted     [place]  Visits from Start of Care: 19  Current functional level related to goals / functional outcomes: Pt made good gains with loudness in conversation. When linguistic demand increases pt has slightly more difficulty remaining within normal loudness limits (reduces voice volume). See goals above for details.   Remaining deficits: Mild dysarthria c/b intermittent reduced voice loudness and very mild articulatory imprecision. Pt will benefit from cont'd work with loud /a/ at home and was told this today at d/c.   Education / Equipment: Benefits of loud /a/, keep self talk simple ("be loud" "breathe well", instead of 3-4 messages to focus on for improved speech loudness and intelligibility).   Plan: Patient agrees to discharge.  Patient goals were met. Patient is being discharged due to meeting the stated rehab goals.  ?????        Problem List Patient Active Problem List   Diagnosis Date Noted  . Parkinson disease ([place])     [place] ,[place], [place]  01/28/2016, 11:34 AM  [facility] [address] [place], [name], [address] Phone: [phone]   Fax:  [phone]   Name: Ronald Noble MRN: [id number] Date of Birth: 1946-01-21

## 2016-01-28 NOTE — Patient Instructions (Signed)
Continue with your loud "ah"s. It will allow you the best opportunity to keep your speech volume calibrated to normal limits.  Remain talking loudly (remember, this is normal volume) with everyone.  See you in April!

## 2016-01-29 DIAGNOSIS — Z9689 Presence of other specified functional implants: Secondary | ICD-10-CM | POA: Diagnosis not present

## 2016-01-29 DIAGNOSIS — Z79899 Other long term (current) drug therapy: Secondary | ICD-10-CM | POA: Diagnosis not present

## 2016-01-29 DIAGNOSIS — H0259 Other disorders affecting eyelid function: Secondary | ICD-10-CM | POA: Diagnosis not present

## 2016-01-29 DIAGNOSIS — G20 Parkinson's disease: Secondary | ICD-10-CM | POA: Diagnosis not present

## 2016-01-29 DIAGNOSIS — G4752 REM sleep behavior disorder: Secondary | ICD-10-CM | POA: Diagnosis not present

## 2016-01-30 DIAGNOSIS — L565 Disseminated superficial actinic porokeratosis (DSAP): Secondary | ICD-10-CM | POA: Diagnosis not present

## 2016-01-30 DIAGNOSIS — D225 Melanocytic nevi of trunk: Secondary | ICD-10-CM | POA: Diagnosis not present

## 2016-01-30 DIAGNOSIS — Z85828 Personal history of other malignant neoplasm of skin: Secondary | ICD-10-CM | POA: Diagnosis not present

## 2016-01-30 DIAGNOSIS — D3617 Benign neoplasm of peripheral nerves and autonomic nervous system of trunk, unspecified: Secondary | ICD-10-CM | POA: Diagnosis not present

## 2016-01-30 DIAGNOSIS — D1801 Hemangioma of skin and subcutaneous tissue: Secondary | ICD-10-CM | POA: Diagnosis not present

## 2016-02-03 DIAGNOSIS — E784 Other hyperlipidemia: Secondary | ICD-10-CM | POA: Diagnosis not present

## 2016-02-03 DIAGNOSIS — Z125 Encounter for screening for malignant neoplasm of prostate: Secondary | ICD-10-CM | POA: Diagnosis not present

## 2016-02-03 DIAGNOSIS — E559 Vitamin D deficiency, unspecified: Secondary | ICD-10-CM | POA: Diagnosis not present

## 2016-02-04 DIAGNOSIS — Z6822 Body mass index (BMI) 22.0-22.9, adult: Secondary | ICD-10-CM | POA: Diagnosis not present

## 2016-02-04 DIAGNOSIS — G4733 Obstructive sleep apnea (adult) (pediatric): Secondary | ICD-10-CM | POA: Diagnosis not present

## 2016-02-04 DIAGNOSIS — H9190 Unspecified hearing loss, unspecified ear: Secondary | ICD-10-CM | POA: Diagnosis not present

## 2016-02-04 DIAGNOSIS — A6920 Lyme disease, unspecified: Secondary | ICD-10-CM | POA: Diagnosis not present

## 2016-02-04 DIAGNOSIS — R55 Syncope and collapse: Secondary | ICD-10-CM | POA: Diagnosis not present

## 2016-02-04 DIAGNOSIS — E559 Vitamin D deficiency, unspecified: Secondary | ICD-10-CM | POA: Diagnosis not present

## 2016-02-04 DIAGNOSIS — E784 Other hyperlipidemia: Secondary | ICD-10-CM | POA: Diagnosis not present

## 2016-02-04 DIAGNOSIS — C801 Malignant (primary) neoplasm, unspecified: Secondary | ICD-10-CM | POA: Diagnosis not present

## 2016-02-04 DIAGNOSIS — N529 Male erectile dysfunction, unspecified: Secondary | ICD-10-CM | POA: Diagnosis not present

## 2016-02-04 DIAGNOSIS — M546 Pain in thoracic spine: Secondary | ICD-10-CM | POA: Diagnosis not present

## 2016-02-04 DIAGNOSIS — G20 Parkinson's disease: Secondary | ICD-10-CM | POA: Diagnosis not present

## 2016-02-04 DIAGNOSIS — N401 Enlarged prostate with lower urinary tract symptoms: Secondary | ICD-10-CM | POA: Diagnosis not present

## 2016-02-05 DIAGNOSIS — Z1212 Encounter for screening for malignant neoplasm of rectum: Secondary | ICD-10-CM | POA: Diagnosis not present

## 2016-03-06 DIAGNOSIS — B353 Tinea pedis: Secondary | ICD-10-CM | POA: Diagnosis not present

## 2016-03-06 DIAGNOSIS — L602 Onychogryphosis: Secondary | ICD-10-CM | POA: Diagnosis not present

## 2016-03-06 DIAGNOSIS — B351 Tinea unguium: Secondary | ICD-10-CM | POA: Diagnosis not present

## 2016-03-10 ENCOUNTER — Encounter: Payer: Self-pay | Admitting: Neurology

## 2016-03-16 DIAGNOSIS — G20 Parkinson's disease: Secondary | ICD-10-CM | POA: Diagnosis not present

## 2016-03-16 DIAGNOSIS — G513 Clonic hemifacial spasm: Secondary | ICD-10-CM | POA: Diagnosis not present

## 2016-03-16 DIAGNOSIS — G245 Blepharospasm: Secondary | ICD-10-CM | POA: Diagnosis not present

## 2016-03-16 DIAGNOSIS — Z4542 Encounter for adjustment and management of neuropacemaker (brain) (peripheral nerve) (spinal cord): Secondary | ICD-10-CM | POA: Diagnosis not present

## 2016-03-16 DIAGNOSIS — G249 Dystonia, unspecified: Secondary | ICD-10-CM | POA: Diagnosis not present

## 2016-03-16 DIAGNOSIS — G4752 REM sleep behavior disorder: Secondary | ICD-10-CM | POA: Diagnosis not present

## 2016-03-17 DIAGNOSIS — B351 Tinea unguium: Secondary | ICD-10-CM | POA: Diagnosis not present

## 2016-03-27 DIAGNOSIS — B353 Tinea pedis: Secondary | ICD-10-CM | POA: Diagnosis not present

## 2016-03-27 DIAGNOSIS — L602 Onychogryphosis: Secondary | ICD-10-CM | POA: Diagnosis not present

## 2016-04-09 ENCOUNTER — Encounter: Payer: Self-pay | Admitting: Neurology

## 2016-04-09 ENCOUNTER — Ambulatory Visit (INDEPENDENT_AMBULATORY_CARE_PROVIDER_SITE_OTHER): Payer: Medicare Other | Admitting: Neurology

## 2016-04-09 VITALS — BP 130/68 | HR 72 | Resp 14 | Ht 71.0 in | Wt 160.0 lb

## 2016-04-09 DIAGNOSIS — G20 Parkinson's disease: Secondary | ICD-10-CM

## 2016-04-09 DIAGNOSIS — Z9689 Presence of other specified functional implants: Secondary | ICD-10-CM

## 2016-04-09 DIAGNOSIS — K5909 Other constipation: Secondary | ICD-10-CM

## 2016-04-09 DIAGNOSIS — Z8781 Personal history of (healed) traumatic fracture: Secondary | ICD-10-CM | POA: Diagnosis not present

## 2016-04-09 DIAGNOSIS — Z87898 Personal history of other specified conditions: Secondary | ICD-10-CM

## 2016-04-09 NOTE — Patient Instructions (Addendum)
Please monitor your weight and caloric intake. Drink about 3 bottles of water (16.9 oz each).   Constipation can be a big problem in advanced or advancing Parkinson's disease. It is important to be proactive: this includes ensuring adequate water intake and mobilization (walking around), utilizing stool softeners as needed, an over-the-counter laxative as needed up to daily if needed, adding a probiotic in pill form or in the form of yogurt can help as well. Sometimes, using a suppository or enema becomes necessary. Please strive for a formed bowel every 2-3 days at least.   Please do not climb ladders or work at heights.

## 2016-04-09 NOTE — Progress Notes (Signed)
Subjective:    Patient ID: Ronald Noble is a 71 y.o. male.  HPI     Interim history:   Ronald Noble is a 71 year old right-handed gentleman with an underlying medical history of skin cancer including melanoma, enlarged prostate status post TURP who presents for followup consultation of his R sided predominant Parkinson's disease, complicated by RBD and sleep disturbance as well as mild dyskinesias, s/p bilateral DBS placement at [facility]. He is unaccompanied today. I last saw him on 07/09/2015, at which time he reported no recent syncope. He had a tilt table test in January 2017 which showed abnormal findings but minimal symptoms reported. He saw Dr. Hartford Poli with vascular surgery for consultation on 05/28/2015. He had a 24-hour blood pressure monitor. He had a follow-up appointment pending with his vascular specialist. He also had an appointment pending with [name] in October 2017. He felt he pulled a muscle in his upper back on the right. He was on Stalevo about 4 times a day, sometimes 5 times a day depending on his day to day activities. He was on long-acting Requip 6 mg once daily. He was sleeping a little better. He had been advised to start using compression stockings but had not started yet. He had noticed worsening of droopy eyelid. He mailed in December 2017 Re: Blepharospasm and treatment for this. He had been seeing an ophthalmologist.  Today, 04/09/2016: He reports doing okay, facial dyskinesias and left-sided blepharospasm have improved. He saw Clarisse, NP with [name] about a month ago and was started on amantadine which is now twice daily. He has tapered himself off of Stalevo completely and for the past 7-8 days he has been off of it and facial dyskinesias and other side effects including abdominal cramping have improved. He is in the process of being scheduled for Botox injections to left face and around left eye for blepharospasm and hemifacial spasm at [facility]. He is still on  long-acting ropinirole once daily. When he tried to stop it he had a flareup of restless leg symptoms. He denies any recent falls or syncopal event or presyncopal symptoms. He is still not great with his food intake as far as regular food intake and water intake. He admits that he continues to climb ladders at work at [employer], including painting etc. He works out regularly. He feels that constipation is under control, has a bowel movement every 2-3 days.   Previously:    I saw him on 03/11/2015, at which time he reported a recent fall in September 2016. He had just come back from a road trip and after being in the car for about 2-1/2 hours he got out of the car and fell to the ground for his wife even realize what happened. He fell on pavement and chipped his tooth. They were with some friends who helped. He had to have a crown placed over his chipped tooth. His DBS settings were adjusted when he had his follow-up at [facility]. His orthostatic blood pressure values were stable. Since his settings were changed he had no other syncopal spell. He did have some medication changes as well. He was no longer on clonazepam. Stalevo was 100 mg strength 4-5 times a day. Requip long-acting was 6 mg once daily. She was on long-acting melatonin 6 mg each night. He was advised to drink 4 bottles of water. He was scheduled for tilt table test as well. His wife was concerned about him climbing on ladders. We talked about  gait safety and syncope risk. He was advised not to climb on any ladders or work at heights.    I saw him on 11/07/2014, at which time he reported doing reasonably well. He had nasal congestion after his fall and injury. Of note, unfortunately, he had a syncopal spell on 10/29/2014 and hit his nose as he fell and sustained a nasal fracture. I reviewed the emergency room records. He was referred to ENT. His laceration was repaired with Dermabond. He reported that he stood up and felt lightheaded  and then passed out. He fell and hit his face. He had a CT head without contrast as well as maxillofacial CT without contrast on 10/29/2014 which showed: RIGHT and LEFT nasal bone fractures. Small displaced fracture fragment on the LEFT. No skull fracture or intracranial hemorrhage. Unremarkable appearing deep brain stimulator leads. He fell at work. He had taken Levitra at night and 2 days prior he was working hard in the yard. He is notoriously not drinking enough water he admits. He had taken his Stalevo in the morning and was due for his next dose and became lightheaded when he got up quickly from his desk. He had been sitting for about 45 minutes consistently at his desk. He did have a warning sign and kept walking instead of sitting down again. He had a follow-up appointment at [facility] on 11/02/2014 and his Requip was increased slightly again. He had re-programming of his DBS as well. He had reduced the Requip XL to 3 mg twice daily but this was increased to 4 mg twice daily. His Stalevo was at 4-6 pills daily depending on his work day. He does get sleepy at night around 9:30 and often falls asleep while watching TV. He tried Melatonin long-acting but in combination with clonazepam at night it did not work well and in fact he had insomnia. He tried to reduce his clonazepam to 0.25 mg each night but increased it back to 0.5 mg each night. For his nose fracture he saw Dr. Redmond Baseman and was scheduled for surgery. I did not change his medications. I asked him to drink more water and reminded him to change positions slowly.   I saw him on 08/07/2014, at which time he reported doing quite well after his DBS placement. His wife felt that he was doing great. He was eating well. He was not drinking enough water. He had trace edema in his distal lower extremities. He had 2 appointments for DBS programming. His Stalevo was reduced from 6 pills a day to 4 pills a day but then he increased it back to 5 pills a day. He  was off generic Sinemet. He was on Azilect once daily and Requip long-acting 8 mg twice daily.    He's been back to work. He is currently on Azilect once daily, Requip XL 8 mg twice daily, Stalevo 100 mg strength one pill 5 times a day and he has stopped the carbidopa-levodopa. I reviewed his outside records from Dr. Salomon Fick at [facility] neurosurgery. Patient had bilateral STN DBS electrodes placed on 06/01/2014. He had pulse generators placed approximately 3 weeks after that, 06/13/14.   I saw him on 02/01/2014, at which time he reported difficulty staying asleep. He had not consistently tried melatonin. He had problems with urinary urgency and was advised to try Kegel exercises by his urologist. His PCP talked to an infectious diseases specialist and it was determined he did not actually have Lyme disease.  He did not show for an appointment on 01/30/2014 - he genuinely forgot!   I saw him on 12/19/2013, at which time he reported that his medication was not working as well. He reported more freezing episodes and more off time. Of note, he had been using a new protein milkshake that he was drinking first thing in the morning. He had lost some weight and in an effort to improve his weight loss he had started this new supplements. His primary care physician had referred him to a nutritionist. We also talked about DBS evaluation and he requested to see another specialist for DBS candidacy. I referred him to [name] at [facility] I also advised him to discontinue using the new protein supplement as I felt it may be interfering with the absorption of his Stalevo. I also suggested a trial of Parcopa half a pill 3 times a day at 11, 2 PM and 7 PM and we kept the Stalevo the same. He was advised to use MiraLAX as needed for constipation.     I saw him on 07/31/13, at which time [name], including swimming, yoga, and  weights. He has been on Stalevo 100 mg every 2 1/2 hours starting at 7:30 AM for 6 doses. He stopped clonazepam, as he felt some daytime sluggishness. He has been taking C/L 1/2 tid in between. He had no cognitive issues, but did have some "downtime" midday. I did not change his medications. He had an appointment with [name] at [facility] in June, but there was a mix up with his appointment and he does not have an appointment until next year.   In the interim, he was diagnosed with Lyme d/s in July. He was tested and treated by his PCP has has been referred to an infectious d/s doctor. He brings in his latest lying test results from 12/05/2013 which I reviewed: It looks like his IgM antibody status is negative but his IgG antibody status has not quite turn positive as far as I understand the report. That means he is probably in the subacute or early chronic phase if I interpret this correctly. He requested a sooner than scheduled appointment, due to feeling more freezing and more off time. This starts in the late morning hours but can also go into the afternoon after lunch. Cognitively and mood wise he feels stable. He does not sleep well at night. He has not consistently tried melatonin I recommended.   I saw him on 01/30/2013, at which time I felt he was stable. We talked about DBS surgery. I did not make any medication changes. He is aware of the increase risk of melanoma associated with DAs and his dermatologist is aware that he is taking Requip XL.   I first met him on 09/27/2012, which time I did not make any changes to his medication and did not order any new test as he was stable. He saw [name] at [facility] and discussed DBS in the past but was not deemed a cadidate for it at the time as he was doing well. He was told by [name] to stay on schedule with his medication. 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 Sleep has been an issue. He goes to sleep well, but wakes up after 45  minutes to 1 hour. He is seeing a urologist. He exercises regularly.   He previously followed with  [name] and was last seen by him on 05/25/2012, at which time [name] felt he was stable, but because of off time at 2 hours he increased his carbidopa-levodopa by adding half of a 10-100 mg strength 3 times a day to his regimen. He recommended decreasing his Stalevo to 125 mg from 100 mg 6 times a day. He has been on Stalevo 125 mg 6 times a day, at 7:30, 10, 12:30, 3 PM, 5:30 PM and 8 PM. He is on clonazepam 0.5 mg at night, Requip long-acting 6 mg twice daily, rasagiline 1 mg once daily, carbidopa-levodopa 10-100 mg strength half a tablet 3 times a day, 7:30 to 10, then midday and later in the evening.   He was followed by [name] since 2008 with a history of Parkinson's disease. He has a history of increased tone and slowness as well as tremor with R sided predominance. He was initially placed on Requip XL and then rasagiline was added in October 2008. In February 2010 Stalevo was added. He had low vitamin D levels in 2008. ANA, ESR, ceruloplasmin were normal except for slightly low ceruloplasmin of 16. Urine copper test was negative. Methylmalonic acid was normal. Urine for heavy metals is normal as well. MRI brain with and without contrast on 01/07/2007 was normal. He had a sleep study in October 2008 showing PLMs and evidence of RBD. Repeat sleep study in September 2011 showed supine obstructive sleep apnea and RBD. He had a CPAP titration study in late September 2011 which was not very successful. He has been on clonazepam at night. He has not noticed any compulsive behaviors, memory loss, depression, swelling, or orthostatic dizziness. He recently had a skin biopsy showing atypical cells, concerning for melanoma.   He works full-time as an Forensic psychologist, [employer]. He exercises regularly and does yoga.    His Past Medical History Is Significant For: Past Medical History:  Diagnosis Date   . BPH (benign prostatic hyperplasia)   . Parkinson disease ([place])   . Skin cancer of face    arms and back  . Sleep apnea    mild  . Spider bite    brown recluse/ left ankle/08/13    His Past Surgical History Is Significant For: Past Surgical History:  Procedure Laterality Date  . BURR HOLE W/ STEREOTACTIC INSERTION OF DBS LEADS / INTRAOP MICROELECTRODE RECORDING    . COLONOSCOPY    . KNEE SURGERY     exploratory/left  . SKIN CANCER EXCISION  01/2014   face  . TRANSURETHRAL RESECTION OF PROSTATE     2008    His Family History Is Significant For: Family History  Problem Relation Age of Onset  . COPD Mother   . Cancer Father     His Social History Is Significant For: Social History   Social History  . Marital status: Married    Spouse name: Levada Dy  . Number of children: 4  . Years of education: 31   Occupational History  . [place] History Main Topics  . Smoking status: Never Smoker  . Smokeless tobacco: Never Used  . Alcohol use 3.0 oz/week    3 Glasses of wine, 2 Cans of beer per week     Comment: occasional  . Drug use: No  . Sexual activity: Not Asked   Other Topics Concern  . None   Social History Narrative   Patient is right handed and resides with wife  1-2 cups of coffee a day     His Allergies Are:  No Known Allergies:   His Current Medications Are:  Outpatient Encounter Prescriptions as of 04/09/2016  Medication Sig  . amantadine (SYMMETREL) 100 MG capsule   . Ropinirole HCl 6 MG TB24   . [DISCONTINUED] carbidopa-levodopa-entacapone (STALEVO 100) 25-100-200 MG tablet Take 1 tablet by mouth 4 (four) times daily.  . [DISCONTINUED] desmopressin (DDAVP) 0.2 MG tablet Take 0.6 mg by mouth at bedtime.  . [DISCONTINUED] rasagiline (AZILECT) 1 MG TABS tablet Take 1 tablet (1 mg total) by mouth daily.  . [DISCONTINUED] vardenafil (LEVITRA) 20 MG tablet Take 20 mg by mouth daily as needed for erectile dysfunction.  Reported on 09/06/2015   No facility-administered encounter medications on file as of 04/09/2016.   :  Review of Systems:  Out of a complete 14 point review of systems, all are reviewed and negative with the exception of these symptoms as listed below:   Review of Systems  Neurological:       Patient has had some medication changes since being at [facility]. He is only taking Amantadine and Ropinirole now and states that he is doing well.      Objective:  Neurologic Exam  Physical Exam Physical Examination:   Vitals:   04/09/16 1634  BP: 130/68  Pulse: 72  Resp: 14   General Examination: The patient is a very pleasant 71 y.o. male in no acute distress. He denies any lightheadedness upon standing. He is in good spirits today. He has lost about 8 lb since last visit.   HEENT: Normocephalic, atraumatic, pupils are equal, round and reactive to light and accommodation. Extraocular tracking shows mild saccadic breakdown without nystagmus noted. He has some scarring on his nose, but looks well healed. There is no limitation to his gaze, and no significantBlepharospasm of her left hemifacial spasm. Neck is moderately rigid. Oropharynx exam reveals mild mouth dryness. No significant airway crowding is noted. Mallampati is class II. Tongue protrudes centrally and palate elevates symmetrically. There is no drooling. There are no dyskinesias. His stimulator wires are tunneled on the left side.   Chest: is clear to auscultation without wheezing, rhonchi or crackles noted. His generator site is not tender.   Heart: sounds are regular and normal without murmurs, rubs or gallops noted.   Abdomen: is soft, non-tender and non-distended with normal bowel sounds appreciated on auscultation.  Extremities: There is no edema in the distal lower extremities bilaterally.   Skin: is warm and dry with no trophic changes noted. Age-related changes are noted on the skin and scars from skin cancer removals.    Musculoskeletal: exam reveals no obvious joint deformities, tenderness, joint swelling or erythema.  Neurologically:  Mental status: The patient is awake and alert, paying good attention. He is able to completely provide the history. He is oriented to: person, place, time/date, situation, day of week, month of year and year. His memory, attention, language and knowledge are appropriate. There is no aphasia, agnosia, apraxia or anomia. There is a no significant degree of bradyphrenia. Speech is mild to moderately hypophonic with no dysarthria noted. Mood is congruent and affect is normal.   Cranial nerves are as described above under HEENT exam. In addition, shoulder shrug is normal with equal shoulder height noted.  Motor exam: Normal bulk, and strength for age is noted. There are no dyskinesias noted today.  Tone is mildly rigid with presence of cogwheeling in the right upper extremity. There is overall  mild to moderate bradykinesia. There is no drift or rebound.  There is a no resting tremor today.   Romberg is negative, with the exception of mild sway.  Reflexes are 1+ in the upper extremities and 2+ in the lower extremities.   Fine motor skills exam:  Fine motor skills are mild to moderately impaired, more pronounced on the R than L, stable. Cerebellar testing shows no dysmetria or intention tremor on finger to nose testing. Heel to shin is unremarkable bilaterally. There is no truncal or gait ataxia.   Sensory exam is intact to light touch throughout .   Gait, station and balance: He stands up from the seated position with no significant difficulty and does not need to push up with His hands. He needs no assistance. No veering to one side is noted. He is not noted to lean to the side. Posture is mildly stooped, advanced for age, slight lean to the right. He walks with fairly good pace and mildly reduced stride length and reduced arm swing bilaterally. He has slight difficulty turning, tends  to turn too fast, balance is minimally impaired.     Assessment and Plan:   In summary, Olney Monier is a very pleasant 71 year old male with a history of right-sided predominant Parkinson's disease, complicated by RBD, sleep disturbance, generalized dyskinesias, motor fluctuations, orthostatic hypotension, syncopal spells, falls, constipation and bladder hyperactivity, s/p b/l DBS placement at [facility] on 06/01/14 and generator placement on 06/13/2014. Overall, he has done well. He did have 2 distinct syncopal spells that led to collapse, falls, and injuries. These happened fairly back to back in the summer of 2016. He sustained Fx of nose when he fell in his office, and then also fell in his driveway and chipped a tooth. Since then, he had a crown placed. He had nose surgery. He saw a vascular specialist and had a tilt table test at [facility]. He had a 24-hour blood pressure monitor test. He has recently been started on amantadine which he tolerates and facial dyskinesias have improved. He has weaned himself off Stalevo over the past month. He has lost weight and is advised to ensure good and regular nutrition and good hydration. Furthermore, he is advised to be proactive with respect to constipation issues, he is advised to stride for a formed bowel movement at least every 2-3 days, and utilize over-the-counter stool softener or laxative as needed. Constipation can become a severe issue and advancing Parkinson's disease. He has an appointment later this month at [facility]. He is strongly advised to refrain from climbing ladders or be at [facility]. I suggested a 6 month follow-up, sooner as needed. I answered all his questions today and he was in agreement.  I spent 25 minutes in total face-to-face time with the patient, more than 50% of which was spent in counseling and coordination of care, reviewing test results, reviewing medication and discussing or reviewing the diagnosis of PD,  hypotension and  syncope, the prognosis and treatment adjustments down the road.

## 2016-04-30 DIAGNOSIS — G2401 Drug induced subacute dyskinesia: Secondary | ICD-10-CM | POA: Diagnosis not present

## 2016-04-30 DIAGNOSIS — Z79899 Other long term (current) drug therapy: Secondary | ICD-10-CM | POA: Diagnosis not present

## 2016-04-30 DIAGNOSIS — Z9689 Presence of other specified functional implants: Secondary | ICD-10-CM | POA: Diagnosis not present

## 2016-04-30 DIAGNOSIS — G20 Parkinson's disease: Secondary | ICD-10-CM | POA: Diagnosis not present

## 2016-04-30 DIAGNOSIS — G249 Dystonia, unspecified: Secondary | ICD-10-CM | POA: Diagnosis not present

## 2016-05-26 DIAGNOSIS — M859 Disorder of bone density and structure, unspecified: Secondary | ICD-10-CM | POA: Diagnosis not present

## 2016-05-26 DIAGNOSIS — G20 Parkinson's disease: Secondary | ICD-10-CM | POA: Diagnosis not present

## 2016-05-26 DIAGNOSIS — Z79899 Other long term (current) drug therapy: Secondary | ICD-10-CM | POA: Diagnosis not present

## 2016-05-26 DIAGNOSIS — Z9689 Presence of other specified functional implants: Secondary | ICD-10-CM | POA: Diagnosis not present

## 2016-05-28 DIAGNOSIS — H2513 Age-related nuclear cataract, bilateral: Secondary | ICD-10-CM | POA: Diagnosis not present

## 2016-05-28 DIAGNOSIS — H5203 Hypermetropia, bilateral: Secondary | ICD-10-CM | POA: Diagnosis not present

## 2016-07-08 DIAGNOSIS — B353 Tinea pedis: Secondary | ICD-10-CM | POA: Diagnosis not present

## 2016-07-08 DIAGNOSIS — L602 Onychogryphosis: Secondary | ICD-10-CM | POA: Diagnosis not present

## 2016-07-28 ENCOUNTER — Telehealth: Payer: Self-pay | Admitting: Neurology

## 2016-07-28 ENCOUNTER — Ambulatory Visit: Payer: Medicare Other | Admitting: Occupational Therapy

## 2016-07-28 ENCOUNTER — Ambulatory Visit: Payer: Medicare Other | Attending: Internal Medicine

## 2016-07-28 ENCOUNTER — Ambulatory Visit: Payer: Medicare Other | Admitting: Physical Therapy

## 2016-07-28 DIAGNOSIS — R2689 Other abnormalities of gait and mobility: Secondary | ICD-10-CM

## 2016-07-28 DIAGNOSIS — R29898 Other symptoms and signs involving the musculoskeletal system: Secondary | ICD-10-CM | POA: Insufficient documentation

## 2016-07-28 DIAGNOSIS — R29818 Other symptoms and signs involving the nervous system: Secondary | ICD-10-CM | POA: Insufficient documentation

## 2016-07-28 DIAGNOSIS — G20 Parkinson's disease: Secondary | ICD-10-CM

## 2016-07-28 DIAGNOSIS — R471 Dysarthria and anarthria: Secondary | ICD-10-CM

## 2016-07-28 NOTE — Therapy (Signed)
[place], [name], [address] Phone: [phone]   Fax:  [phone]  Patient Details  Name: Ronald Noble MRN: [id number] Date of Birth: Aug 18, 1945 Referring Provider:  [name], MD  Encounter Date: 07/28/2016   Physical Therapy Parkinson's Disease Screen   Timed Up and Go test: 8.94 sec   10 meter walk test:8.16 sec (4.02 ft/sec)  5 time sit to stand test:7.03 sec (with significant posterior lean)  Pt reports having 3-4 falls in the past 6 months, feels he is slower, feels he has a harder time regaining his balance.  Patient would benefit from Physical Therapy evaluation due to slightly slowed mobility measures, reports of falls and balance changes.    Keylah Darwish W. 07/28/2016, 1:35 PM  Frazier Butt., PT [place] [address] [place], [name], [address] Phone: [phone]   Fax:  [phone]

## 2016-07-28 NOTE — Telephone Encounter (Signed)
As per neuro rehab recs, patient would benefit from PT/OT/ST, will place neuro rehab referall. Please process.

## 2016-07-28 NOTE — Therapy (Signed)
[facility] [address] [place], [name], [address] Phone: [phone]   Fax:  [phone]  Patient Details  Name: Ronald Noble MRN: [id number] Date of Birth: Jun 10, 1945 Referring Provider: Star Age, MD  Encounter Date: 07/28/2016  Speech Therapy Parkinson's Disease Screen   Decibel Level today: 70dB  (WNL=70-72 dB) with sound level meter 30cm away from pt's mouth. Pt's conversational volume has remained essentially the same since last treatment course. Pt noted with occasional (more than rare) rushes of speech, complicating speech intelligibility for this trained listener.  Pt reports he is having rare to occasional difficulty with medication - pt may require modified barium swallow in the future.   Pt would benefit from speech-language eval for dysarthria, with a focus on slowing his rate of speech - please order via EPIC to schedule.   [place] ,[place], Petros 07/28/2016, 1:26 PM  [facility] [address] [place], [name], [address] Phone: [phone]   Fax:  [phone]

## 2016-07-28 NOTE — Therapy (Signed)
[facility] [address] [place], [name], [address] Phone: [phone]   Fax:  [phone]  Patient Details  Name: Ronald Noble MRN: [id number] Date of Birth: [birth year] Referring Provider:  [name], MD  Encounter Date: 07/28/2016   Occupational Therapy Parkinson's Disease Screen  Hand dominance:  R-handed    Physical Performance Test item #4 (donning/doffing jacket):  11.87 sec  Fastening/unfastening 3 buttons in:  34.22 sec  9-hole peg test:    RUE  25.75 sec        LUE  27.56 sec  Change in ability to perform ADLs/IADLs:  Difficulty typing with incr errors, difficulty, difficulty eating  Other Comments:  incr dystonia noted   Pt would benefit from occupational therapy evaluation due to  incr difficulty with coordination and ADLs.   [facility] 07/28/2016, 1:34 PM  [facility] [facility] [address] [place], [name], [address] Phone: [phone]   Fax:  [place], OTR/L [facility] [address] [place], [place]  [address] [phone] phone [phone] 07/28/16 1:43 PM

## 2016-07-29 NOTE — Telephone Encounter (Signed)
Noted order sent.

## 2016-07-30 DIAGNOSIS — D1801 Hemangioma of skin and subcutaneous tissue: Secondary | ICD-10-CM | POA: Diagnosis not present

## 2016-07-30 DIAGNOSIS — L570 Actinic keratosis: Secondary | ICD-10-CM | POA: Diagnosis not present

## 2016-07-30 DIAGNOSIS — L565 Disseminated superficial actinic porokeratosis (DSAP): Secondary | ICD-10-CM | POA: Diagnosis not present

## 2016-07-30 DIAGNOSIS — Z85828 Personal history of other malignant neoplasm of skin: Secondary | ICD-10-CM | POA: Diagnosis not present

## 2016-07-30 DIAGNOSIS — D225 Melanocytic nevi of trunk: Secondary | ICD-10-CM | POA: Diagnosis not present

## 2016-07-30 DIAGNOSIS — L905 Scar conditions and fibrosis of skin: Secondary | ICD-10-CM | POA: Diagnosis not present

## 2016-07-30 DIAGNOSIS — Q828 Other specified congenital malformations of skin: Secondary | ICD-10-CM | POA: Diagnosis not present

## 2016-07-30 DIAGNOSIS — D485 Neoplasm of uncertain behavior of skin: Secondary | ICD-10-CM | POA: Diagnosis not present

## 2016-08-03 DIAGNOSIS — G249 Dystonia, unspecified: Secondary | ICD-10-CM | POA: Diagnosis not present

## 2016-08-03 DIAGNOSIS — G20 Parkinson's disease: Secondary | ICD-10-CM | POA: Diagnosis not present

## 2016-08-03 DIAGNOSIS — M549 Dorsalgia, unspecified: Secondary | ICD-10-CM | POA: Diagnosis not present

## 2016-08-03 DIAGNOSIS — Z9689 Presence of other specified functional implants: Secondary | ICD-10-CM | POA: Diagnosis not present

## 2016-08-03 DIAGNOSIS — Z4542 Encounter for adjustment and management of neuropacemaker (brain) (peripheral nerve) (spinal cord): Secondary | ICD-10-CM | POA: Diagnosis not present

## 2016-08-21 DIAGNOSIS — M545 Low back pain: Secondary | ICD-10-CM | POA: Diagnosis not present

## 2016-08-21 DIAGNOSIS — Z6822 Body mass index (BMI) 22.0-22.9, adult: Secondary | ICD-10-CM | POA: Diagnosis not present

## 2016-08-27 ENCOUNTER — Ambulatory Visit: Payer: Medicare Other

## 2016-08-27 ENCOUNTER — Ambulatory Visit: Payer: Medicare Other | Attending: Neurology | Admitting: Physical Therapy

## 2016-08-27 DIAGNOSIS — R41840 Attention and concentration deficit: Secondary | ICD-10-CM | POA: Diagnosis not present

## 2016-08-27 DIAGNOSIS — R2681 Unsteadiness on feet: Secondary | ICD-10-CM | POA: Insufficient documentation

## 2016-08-27 DIAGNOSIS — R471 Dysarthria and anarthria: Secondary | ICD-10-CM | POA: Diagnosis not present

## 2016-08-27 DIAGNOSIS — R278 Other lack of coordination: Secondary | ICD-10-CM | POA: Insufficient documentation

## 2016-08-27 DIAGNOSIS — R29818 Other symptoms and signs involving the nervous system: Secondary | ICD-10-CM | POA: Insufficient documentation

## 2016-08-27 DIAGNOSIS — R293 Abnormal posture: Secondary | ICD-10-CM | POA: Diagnosis not present

## 2016-08-27 DIAGNOSIS — R29898 Other symptoms and signs involving the musculoskeletal system: Secondary | ICD-10-CM | POA: Diagnosis not present

## 2016-08-27 DIAGNOSIS — R41841 Cognitive communication deficit: Secondary | ICD-10-CM | POA: Insufficient documentation

## 2016-08-27 DIAGNOSIS — R2689 Other abnormalities of gait and mobility: Secondary | ICD-10-CM | POA: Insufficient documentation

## 2016-08-27 NOTE — Patient Instructions (Signed)
  LOUD "ah" 7 times, twice a day  Generate a list of 10 sentences you say every day, and bring this to the next session. Begin to say these loudly when you do your loud "ah"s.

## 2016-08-27 NOTE — Therapy (Signed)
[facility] [address] [place], [name], [address] Phone: [phone]   Fax:  [phone]  Physical Therapy Evaluation  Patient Details  Name: Ronald Noble MRN: [id number] Date of Birth: [birth year] Referring Provider: Rexene Alberts  Encounter Date: 08/27/2016      PT End of Session - 08/27/16 1248    Visit Number 1   Number of Visits 17   Date for PT Re-Evaluation 10/26/16   Authorization Type Medicare Primary, BCBS 2nd-GCODE every 10th visit   PT Start Time 0850   PT Stop Time 0934   PT Time Calculation (min) 44 min   Activity Tolerance Patient tolerated treatment well   Behavior During Therapy [facility] for tasks assessed/performed      Past Medical History:  Diagnosis Date  . BPH (benign prostatic hyperplasia)   . Parkinson disease ([place])   . Skin cancer of face    arms and back  . Sleep apnea    mild  . Spider bite    brown recluse/ left ankle/08/13    Past Surgical History:  Procedure Laterality Date  . BURR HOLE W/ STEREOTACTIC INSERTION OF DBS LEADS / INTRAOP MICROELECTRODE RECORDING    . COLONOSCOPY    . KNEE SURGERY     exploratory/left  . SKIN CANCER EXCISION  01/2014   face  . TRANSURETHRAL RESECTION OF PROSTATE     2008    There were no vitals filed for this visit.       Subjective Assessment - 08/27/16 0851    Subjective Physically I'm a mess right now.  I strained my back when shoveling several weeks ago.  Then I had a hard time getting up from the toilet.  Went to see the doctor, who dx with pinched nerve in back.  I have not exercised at all in the past 3 weeks, have been on prednisone and the pain is less.  I can just tell that I haven't exercised at all.   Pertinent History s/p DBS, skin cancer, sleep apnea, BPH, TURP, orthostatic hypotension, syncopal episodes; arthritic changes in spine   Patient Stated Goals Pt's goals for therapy are to improve strength, posture, and agility with  movement.   Currently in Pain? Yes   Pain Score 3    Pain Location Back   Pain Orientation Mid;Lower   Pain Descriptors / Indicators Sharp   Pain Type Acute pain  3-4 weeks ago   Pain Onset 1 to 4 weeks ago   Pain Frequency Intermittent   Aggravating Factors  trying to get up from a chair aggravates   Pain Relieving Factors Prednisone, time, walking for a little while   Effect of Pain on Daily Activities PT will monitor, and may address through education on posture, positioning            [facility] PT Assessment - 08/27/16 0858      Assessment   Medical Diagnosis Parkinson's disease   Referring Provider [place]   Onset Date/Surgical Date 07/28/16  PD screen     Precautions   Precautions Fall   Precaution Comments s/p Deep brain stimulator bilateral, 06/2014     Balance Screen   Has the patient fallen in the past 6 months Yes   How many times? 3  multiple stumbles, more difficulty regaining balance   Has the patient had a decrease in activity level because of a fear of falling?  No   Is the patient reluctant to leave their home because of  a fear of falling?  No     Home Environment   Living Environment Private residence   Living Arrangements Spouse/significant other   Available Help at Discharge Family   Type of [place] to enter   Entrance Stairs-Number of Steps [place] One level     Prior Function   Level of Independence Independent with basic ADLs   Vocation Full time employment  Lawyer   Leisure Enjoys exercise:  stationary bike, Yoga, reports doing some PWR! Moves exercise (in quadruped)     Cognition   Overall Cognitive Status History of cognitive impairments - at baseline     Observation/Other Assessments   Focus on Therapeutic Outcomes (FOTO)  NA     Posture/Postural Control   Posture/Postural Control Postural limitations   Postural Limitations Rounded Shoulders;Forward head;Posterior  pelvic tilt;Weight shift right  Increased lean to R     Tone   Assessment Location Right Lower Extremity;Left Lower Extremity     ROM / Strength   AROM / PROM / Strength Strength     Strength   Overall Strength Comments Grossly tested 4/5 bilateral throughout lower extremities     Transfers   Transfers Sit to Stand;Stand to Sit   Sit to Stand 6: Modified independent (Device/Increase time);Without upper extremity assist;From chair/3-in-1  pushes against chair with backs of legs   Five time sit to stand comments  7.72 sec   Stand to Sit 6: Modified independent (Device/Increase time);Without upper extremity assist;To chair/3-in-1   Comments Pt reports difficulty getting up from recliner, lower surfaces at home     Ambulation/Gait   Ambulation/Gait Yes   Ambulation/Gait Assistance 5: Supervision   Ambulation Distance (Feet) 250 Feet   Assistive device None   Gait Pattern Step-through pattern;Decreased arm swing - right;Decreased arm swing - left;Decreased step length - right;Decreased step length - left;Decreased trunk rotation;Narrow base of support   Ambulation Surface Level;Indoor   Gait velocity 8.85 sec =    Gait velocity - backwards 13.44 sec in 20 sec=1.49 ft/sec      Standardized Balance Assessment   Standardized Balance Assessment Timed Up and Go Test     Timed Up and Go Test   Normal TUG (seconds) 8.88   Manual TUG (seconds) 7.97   Cognitive TUG (seconds) 7.97   TUG Comments Scores >13.5 sec indicate increased fall risk.     High Level Balance   High Level Balance Comments MiniBESTest score:  18/28, with difficulty with SLS on LLE due to inability to lifting RLE (?freezing episode), >2 steps to recover balance in anterior and posterio push and release test, loses balance EC on foam/EC on incline.     RLE Tone   RLE Tone Mild     LLE Tone   LLE Tone Mild       Mini-BESTest: Balance Evaluation Systems Test  2005-2013 [employer]. All  rights reserved. ________________________________________________________________________________________Anticipatory_________Subscore___4__/6 1. SIT TO STAND Instruction: "Cross your arms across your chest. Try not to use your hands unless you must.Do not let your legs lean against the back of the chair when you stand. Please stand up now." X(2) Normal: Comes to stand without use of hands and stabilizes independently. (1) Moderate: Comes to stand WITH use of hands on first attempt. (0) Severe: Unable to stand up from chair without assistance, OR needs several attempts with use of hands. 2. RISE TO TOES Instruction: "Place  your feet shoulder width apart. Place your hands on your hips. Try to rise as high as you can onto your toes. I will count out loud to 3 seconds. Try to hold this pose for at least 3 seconds. Look straight ahead. Rise now." X(2) Normal: Stable for 3 s with maximum height. (1) Moderate: Heels up, but not full range (smaller than when holding hands), OR noticeable instability for 3 s. (0) Severe: < 3 s. 3. STAND ON ONE LEG Instruction: "Look straight ahead. Keep your hands on your hips. Lift your leg off of the ground behind you without touching or resting your raised leg upon your other standing leg. Stay standing on one leg as long as you can. Look straight ahead. Lift now." Left: Time in Seconds Trial 1:_____Trial 2:_____ UNABLE-?freeze trying to lift up RLE (2) Normal: 20 s. (1) Moderate: < 20 s. X(0) Severe: Unable. Right: Time in Seconds Trial 1:__0.72___Trial 2:__11.69___ (2) Normal: 20 s. X(1) Moderate: < 20 s. (0) Severe: Unable To score each side separately use the trial with the longest time. To calculate the sub-score and total score use the side [left or right] with the lowest numerical score [i.e. the worse side]. ______________________________________________________________________________________Reactive Postural Control___________Subscore:__4___/6 4.  COMPENSATORY STEPPING CORRECTION- FORWARD Instruction: "Stand with your feet shoulder width apart, arms at your sides. Lean forward against my hands beyond your forward limits. When I let go, do whatever is necessary, including taking a step, to avoid a fall." (2) Normal: Recovers independently with a single, large step (second realignment step is allowed). X(1) Moderate: More than one step used to recover equilibrium. (0) Severe: No step, OR would fall if not caught, OR falls spontaneously. 5. COMPENSATORY STEPPING CORRECTION- BACKWARD Instruction: "Stand with your feet shoulder width apart, arms at your sides. Lean backward against my hands beyond your backward limits. When I let go, do whatever is necessary, including taking a step, to avoid a fall." (2) Normal: Recovers independently with a single, large step. X(1) Moderate: More than one step used to recover equilibrium. (0) Severe: No step, OR would fall if not caught, OR falls spontaneously. 6. COMPENSATORY STEPPING CORRECTION- LATERAL Instruction: "Stand with your feet together, arms down at your sides. Lean into my hand beyond your sideways limit. When I let go, do whatever is necessary, including taking a step, to avoid a fall." Left X(2) Normal: Recovers independently with 1 step (crossover or lateral OK). (1) Moderate: Several steps to recover equilibrium. (0) Severe: Falls, or cannot step. Right X(2) Normal: Recovers independently with 1 step (crossover or lateral OK). (1) Moderate: Several steps to recover equilibrium. (0) Severe: Falls, or cannot step. Use the side with the lowest score to calculate sub-score and total score. ____________________________________________________________________________________Sensory Orientation_____________Subscore:______2___/6 7. STANCE (FEET TOGETHER); EYES OPEN, FIRM SURFACE Instruction: "Place your hands on your hips. Place your feet together until almost touching. Look straight ahead.  Be as stable and still as possible, until I say stop." Time in seconds:________ X(2) Normal: 30 s. (1) Moderate: < 30 s. (0) Severe: Unable. 8. STANCE (FEET TOGETHER); EYES CLOSED, FOAM SURFACE Instruction: "Step onto the foam. Place your hands on your hips. Place your feet together until almost touching. Be as stable and still as possible, until I say stop. I will start timing when you close your eyes." Time in seconds:________ (2) Normal: 30 s. (1) Moderate: < 30 s. X(0) Severe: Unable. 9. INCLINE- EYES CLOSED Instruction: "Step onto the incline ramp. Please stand on the incline ramp with your  toes toward the top. Place your feet shoulder width apart and have your arms down at your sides. I will start timing when you close your eyes." Time in seconds:________ (2) Normal: Stands independently 30 s and aligns with gravity. (1) Moderate: Stands independently <30 s OR aligns with surface. X(0) Severe: Unable. _________________________________________________________________________________________Dynamic Gait ______Subscore_____8___/10 10. CHANGE IN GAIT SPEED Instruction: "Begin walking at your normal speed, when I tell you 'fast', walk as fast as you can. When I say 'slow', walk very slowly." X(2) Normal: Significantly changes walking speed without imbalance. (1) Moderate: Unable to change walking speed or signs of imbalance. (0) Severe: Unable to achieve significant change in walking speed AND signs of imbalance. [place] - HORIZONTAL Instruction: "Begin walking at your normal speed, when I say "right", turn your head and look to the right. When I say "left" turn your head and look to the left. Try to keep yourself walking in a straight line." (2) Normal: performs head turns with no change in gait speed and good balance. X(1) Moderate: performs head turns with reduction in gait speed. (0) Severe: performs head turns with imbalance. 12. WALK WITH PIVOT  TURNS Instruction: "Begin walking at your normal speed. When I tell you to 'turn and stop', turn as quickly as you can, face the opposite direction, and stop. After the turn, your feet should be close together." X(2) Normal: Turns with feet close FAST (< 3 steps) with good balance. (1) Moderate: Turns with feet close SLOW (>4 steps) with good balance. (0) Severe: Cannot turn with feet close at any speed without imbalance. 13. STEP OVER OBSTACLES Instruction: "Begin walking at your normal speed. When you get to the box, step over it, not around it and keep walking." X(2) Normal: Able to step over box with minimal change of gait speed and with good balance. (1) Moderate: Steps over box but touches box OR displays cautious behavior by slowing gait. (0) Severe: Unable to step over box OR steps around box. 14. TIMED UP & GO WITH DUAL TASK [3 METER WALK] Instruction TUG: "When I say 'Go', stand up from chair, walk at your normal speed across the tape on the floor, turn around, and come back to sit in the chair." Instruction TUG with Dual Task: "Count backwards by threes starting at ___. When I say 'Go', stand up from chair, walk at your normal speed across the tape on the floor, turn around, and come back to sit in the chair. Continue counting backwards the entire time." TUG: ________seconds; Dual Task TUG: ________seconds X(2) Normal: No noticeable change in sitting, standing or walking while backward counting when compared to TUG without Dual Task. (1) Moderate: Dual Task affects either counting OR walking (>10%) when compared to the TUG without Dual Task. (0) Severe: Stops counting while walking OR stops walking while counting. When scoring item 14, if subject's gait speed slows more than 10% between the TUG without and with a Dual Task the score should be decreased by a point. TOTAL SCORE: _____18___/28                        PT Short Term Goals - 08/27/16 1323      PT  SHORT TERM GOAL #1   Title Pt will be independent with HEP for improved balance, posture, functional mobility.  TARGET 09/26/16   Time 4   Period Weeks   Status New     PT SHORT TERM GOAL #  2   Title Pt will perform at least 8 of 10 reps of sit<>stand transfers from <18 surfaces, independently, no posterior lean, for improved transfer efficiency and safety.   Time 4   Period Weeks   Status New     PT SHORT TERM GOAL #3   Title Pt will verbalize at least 3 means to reduce pain in low back with functional activities.   Time 4   Period Weeks   Status New     PT SHORT TERM GOAL #4   Title Pt will improve posterior push and release test to less than 2 steps to independently recover balance in posterior direction.   Time 4   Period Weeks   Status New           PT Long Term Goals - 08/27/16 1325      PT LONG TERM GOAL #1   Title Pt will verbalize understanding of fall prevention in home environment.  TARGET 10/26/16   Time 8   Period Weeks   Status New     PT LONG TERM GOAL #2   Title Pt will improve MiniBESTest score to at least 22/28 for decreased fall risk.   Time 8   Period Weeks   Status New     PT LONG TERM GOAL #3   Title Pt will verbalize understanding of techniques to reduce festination/freezing of gait.   Time 8   Period Weeks   Status New     PT LONG TERM GOAL #4   Title Pt will verbalize plans for continued community fitness upon D/C from PT.   Time 8   Period Weeks   Status New     PT LONG TERM GOAL #5   Title Pt will ambulate at least 1000 ft, indoor and outdoor surfaces, independently, no LOB, for improved community gait.   Time 8   Period Weeks   Status New               Plan - 08/27/16 1252    Clinical Impression Statement Pt is a 71 year old male who presents to OP PT, with history of Parkinson's disease and bilateral deep brain stimulator (DBS) placement.  Pt was seen for PT screen 07/2016, where PT noted slowed mobility and pt reported  at least 3-4 falls in the past 6 months.  Pt presents with decreased timing and coordination of gait, decreased dynamic balance, abnormal posture, postural instability, festination/freezing with gait and initiaiton of gait.  Ptis at fall risk per MiniBEST score of 18/28 and taking>2 steps in posterior push and release test.  Pt would benefit from skilled PT to address the above stated deficits for decreased fall risk and improved functional mobility.    Rehab Potential Good   PT Frequency 2x / week   PT Duration 8 weeks  plus eval   PT Treatment/Interventions ADLs/Self Care Home Management;Functional mobility training;Gait training;Patient/family education;Therapeutic activities;Therapeutic exercise;Balance training;Neuromuscular re-education   PT Next Visit Plan Initiate HEP-PWR! Moves-quadruped, standing, sitting (in conjunction with OT); sit<>stand with proper technique; address back pain through posture/positioning education with ADLs   Consulted and Agree with Plan of Care Patient      Patient will benefit from skilled therapeutic intervention in order to improve the following deficits and impairments:  Abnormal gait, Decreased balance, Decreased mobility, Decreased strength, Difficulty walking, Postural dysfunction, Pain  Visit Diagnosis: Other abnormalities of gait and mobility  Other symptoms and signs involving the nervous system  Unsteadiness on feet  Abnormal posture      G-Codes - September 19, 2016 1429    Functional Assessment Tool Used (Outpatient Only) 5x sit<>stand:  7.72 sec with posterior lean, MiniBESTest 18/28.  3-4 falls in past 6 months; pain in low back 3-4/10 over past 3-4 weeks due to shoveling/yardwork tasks   Functional Limitation Mobility: Walking and moving around   Mobility: Walking and Moving Around Current Status [phone]) At least 20 percent but less than 40 percent impaired, limited or restricted   Mobility: Walking and Moving Around Goal Status [phone]) At least 1  percent but less than 20 percent impaired, limited or restricted       Problem List Patient Active Problem List   Diagnosis Date Noted  . Parkinson disease ([place])     Kataleena Holsapple W. 2016/09/19, 2:45 PM Frazier Butt., PT [place] [address] [place], [name], [address] Phone: [phone]   Fax:  [phone]  Name: Ronald Noble MRN: [id number] Date of Birth: 08-10-1945

## 2016-09-01 ENCOUNTER — Ambulatory Visit: Payer: Medicare Other | Admitting: Occupational Therapy

## 2016-09-01 DIAGNOSIS — R278 Other lack of coordination: Secondary | ICD-10-CM

## 2016-09-01 DIAGNOSIS — R29898 Other symptoms and signs involving the musculoskeletal system: Secondary | ICD-10-CM | POA: Diagnosis not present

## 2016-09-01 DIAGNOSIS — R29818 Other symptoms and signs involving the nervous system: Secondary | ICD-10-CM

## 2016-09-01 DIAGNOSIS — R2681 Unsteadiness on feet: Secondary | ICD-10-CM

## 2016-09-01 DIAGNOSIS — R293 Abnormal posture: Secondary | ICD-10-CM

## 2016-09-01 DIAGNOSIS — R2689 Other abnormalities of gait and mobility: Secondary | ICD-10-CM | POA: Diagnosis not present

## 2016-09-01 DIAGNOSIS — R41840 Attention and concentration deficit: Secondary | ICD-10-CM

## 2016-09-01 NOTE — Therapy (Signed)
[facility] [place] [address] [place], [name], [address] Phone: [phone]   Fax:  [phone]  Occupational Therapy Evaluation  Patient Details  Name: Ronald Noble MRN: [id number] Date of Birth: 1945-09-17 Referring Provider: Dr. Star Age  Encounter Date: 09/01/2016      OT End of Session - 09/01/16 1733    Visit Number 1   Number of Visits 17   Date for OT Re-Evaluation 10/31/16   Authorization Type Medicare/BCBS - G Code needed   Authorization - Visit Number 1   Authorization - Number of Visits 10   OT Start Time 1325   OT Stop Time 1400   OT Time Calculation (min) 35 min   Activity Tolerance Patient tolerated treatment well   Behavior During Therapy [facility] for tasks assessed/performed      Past Medical History:  Diagnosis Date  . BPH (benign prostatic hyperplasia)   . Parkinson disease ([place])   . Skin cancer of face    arms and back  . Sleep apnea    mild  . Spider bite    brown recluse/ left ankle/08/13    Past Surgical History:  Procedure Laterality Date  . BURR HOLE W/ STEREOTACTIC INSERTION OF DBS LEADS / INTRAOP MICROELECTRODE RECORDING    . COLONOSCOPY    . KNEE SURGERY     exploratory/left  . SKIN CANCER EXCISION  01/2014   face  . TRANSURETHRAL RESECTION OF PROSTATE     2008    There were no vitals filed for this visit.      Subjective Assessment - 09/01/16 1329    Subjective  Pt reports having back pain last 1-2 months, but improved last few days.  Does not participate as much socially due to voice.   Pertinent History PD since 2008, s/p DBS 06/2014, skin cancer, sleep apnea, BPH, TURP, orthostatic hypotension, syncopal episodes; arthritic changes in spine   Limitations DBS, falls, orthostatic hypotension   Patient Stated Goals improve finger dexterity   Currently in Pain? No/denies           [facility] OT Assessment - 09/01/16 0001      Assessment   Diagnosis Parkinson's  disease   Referring Provider Dr. Star Age   Onset Date 07/28/16  OT Screen indicated that pt may benefit from OT   Prior Therapy d/c from OT 10/2015     Precautions   Precautions Fall     Balance Screen   Has the patient fallen in the past 6 months Yes   How many times? 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 standing   Toilet Transfer Modified independent   Toileting - Clothing Manipulation Modified  independent  difficulty with off times   Toileting -  Development worker, community Independent  has seat in the back   Transfers/Ambulation Related to ADL's mod I     IADL   Prior Level of Function Light Housekeeping performs yardwork, stopped played tennis approx 2 yrs ago, no longer uses Nurse, mental health --  performs yardwork   Prior Level of Function Meal Prep wife peforms most cooking   Meal Prep Able to complete simple warm meal prep;Able to complete simple cold meal and snack prep   Community Mobility Drives own vehicle   Prior Level of Function Financial Management independent   Physiological scientist financial matters independently (budgets, writes checks, pays  rent, bills goes to bank), collects and keeps track of income     Mobility   Mobility Status Independent;History of falls;Freezing  later in the day, pt with freezing     Written Expression   Dominant Hand Right   Handwriting Mild micrographia  95-100% legibility     Vision Assessment   Vision Assessment Vision not tested     Cognition   Overall Cognitive Status --  will assess further in functional context prn   Awareness --   Awareness Impairment --  decr safey/awareness of deficits at times    Behaviors Impulsive     Observation/Other Assessments   Observations noted rigidity in trunk and rounded shoulders   Simulated Eating Time (seconds) 8.78 sec   Donning Doffing Jacket Time (seconds) 17.40sec   Donning Doffing Jacket Comments 3 button/unbutton test:  >52min/unable     Coordination   Right 9 Hole Peg Test 29.72   Left 9 Hole Peg Test 48.06   Box and Blocks R-52blocks, L-39blocks     AROM   Overall AROM  Within functional limits for tasks performed     RUE Tone   RUE Tone Mild     LUE Tone   LUE Tone Mild                         OT Education - 09/01/16 1727    Education Details recommended sitting for donning socks; OT eval results and POC; discussed driving changes with PD and recommendation to only drive during "on" times with meds and to have wife intermittently ride with pt to assess safety   Person(s) Educated Patient   Methods Explanation   Comprehension Verbalized understanding          OT Short Term Goals - 09/01/16 1851      OT SHORT TERM GOAL #1   Title Pt will be independent with updated PD specific HEP--STGs due 10/01/16   Time 4   Period Weeks   Status New     OT SHORT TERM GOAL #2   Title Pt will improve L hand coordination for ADLs as shown by improving time on 9-hole peg test by at least 6sec with LUE.   Baseline L-48.06sec   Time 4   Period Weeks   Status New     OT SHORT TERM GOAL #3   Title Pt will improve  coordination/functional reaching for ADLs as shown by improving score on box and blocks test by at least 4 blocks   Baseline L-39 blocks   Time 4   Period Weeks   Status New     OT SHORT TERM GOAL #4   Title Pt will be able to fasten/unfasten 3 button in less than  90sec (for improved ability to dress and improved bilateral hand coordination).   Baseline >52min/unable on tabletop   Time 4   Period Weeks   Status New     OT SHORT TERM GOAL #5   Title Pt will verbalize understanding of updated community resources prn.   Time 4   Period Weeks   Status New           OT Long Term Goals - 09/01/16 1858      OT LONG TERM GOAL #1   Title Pt will verbalize understanding of adaptive strategies to incr ease with ADL's/IADLS's--LTGs due 1/77/11   Time 8   Period Weeks   Status New     OT LONG TERM GOAL #2   Title Pt will improve coordination for ADLs as shown by improving time on 9-hole peg test by at least 12sec with LUE.   Baseline 48.06sec   Time 8   Period Weeks   Status New     OT LONG TERM GOAL #3   Title Pt will improve functional reaching/coordination for ADLs as shown by improving score on box and blocks test by at least 8blocks with LUE.     OT LONG TERM GOAL #4   Title Pt will be able to fasten/unfasten 3 button in less than 70sec (for improved ability to dress and improved bilateral hand coordination).   Baseline unable, >26min   Time 8   Period Weeks   Status New     OT LONG TERM GOAL #5   Title Assess standing functional reach and add goal prn   Time 8   Status New               Plan - 09/01/16 1735    Clinical Impression Statement Pt presents today with bradykinesia/hypokinesia, timing deficits/impulsitvity, cognitive deficits, decr coordination, decr posture, rigidity, decr balance/functional mobility for ADLs/IADLs.  Pt would benefit from occupational thearpy to address these deficits in order to improve ADL/IADL performance, improve safety, prevent  future complications, and improve UE functional use.   Occupational Profile and client history currently impacting functional performance Pt is a 71 y.o. male with diagnosis of Parkinson's Disease (dx in 2008) and is s/p bilateral DBS placement.   Pt works full time as a Chief Executive Officer.  Pt reports that he is not participating as much socially due to PD and is having trouble using his hands (using dictation vs. writing/typing) at work.  Pt reports difficulty/incr time for buttons as well.  PMH also includes:  skin cancer, sleep apnea, BPH, TURP, orthostatic hypotension, syncopal episodes; arthritic changes in spine   Occupational performance deficits (Please refer to evaluation for details): ADL's;IADL's;Work;Social Participation;Leisure   Rehab Potential Good   Current Impairments/barriers affecting progress: decr awareness, s/p DBS   OT Frequency 2x / week   OT Duration 8 weeks  +eval   OT Treatment/Interventions Self-care/ADL training;Moist Heat;DME and/or AE instruction;Patient/family education;Therapeutic exercises;Therapeutic activities;Neuromuscular education;Functional Mobility Training;Passive range of motion;Manual Therapy;Cognitive remediation/compensation;Visual/perceptual remediation/compensation;Energy conservation   Clinical Decision Making Several treatment options, min-mod task modification necessary   OT Home Exercise Plan 11/14/15: PWR! Seated; PWR! supine; 11/19/15  PWR! hands; 11/25/15 coordination HEP. 11/28/15: PWR! Quadraped. 12/03/15: additional coordination HEP; PWR! moves in prone and standing, PD ex. chart 12/05/15    Recommended Other Services currently receiving ST, PT   Consulted and Agree with Plan of Care Patient   Plan review/update coordination HEP and PWR! hands HEP, assess standing functional reach and write goal prn  Patient will benefit from skilled therapeutic intervention in order to improve the following deficits and impairments:  Decreased coordination, Difficulty  walking, Impaired flexibility, Improper body mechanics, Decreased endurance, Decreased safety awareness, Improper spinal/pelvic alignment, Decreased knowledge of precautions, Decreased activity tolerance, Impaired tone, Decreased balance, Decreased knowledge of use of DME, Impaired UE functional use, Decreased cognition, Decreased mobility, Impaired vision/preception, Impaired perceived functional ability  Visit Diagnosis: Other symptoms and signs involving the nervous system - Plan: Ot plan of care cert/re-cert  Other symptoms and signs involving the musculoskeletal system - Plan: Ot plan of care cert/re-cert  Abnormal posture - Plan: Ot plan of care cert/re-cert  Unsteadiness on feet - Plan: Ot plan of care cert/re-cert  Other abnormalities of gait and mobility - Plan: Ot plan of care cert/re-cert  Other lack of coordination - Plan: Ot plan of care cert/re-cert  Attention and concentration deficit - Plan: Ot plan of care cert/re-cert      G-Codes - 85/88/50 1848    Functional Assessment Tool Used (Outpatient only) L 9-hole peg test 48.06sec; L Box and blocks test 39 blocks; unable to fasten/unfasten 3 buttons on table >19min   Functional Limitation Carrying, moving and handling objects   Carrying, Moving and Handling Objects Current Status (Y7741) At least 40 percent but less than 60 percent impaired, limited or restricted   Carrying, Moving and Handling Objects Goal Status (O8786) At least 1 percent but less than 20 percent impaired, limited or restricted      Problem List Patient Active Problem List   Diagnosis Date Noted  . Parkinson disease [facility])     [facility] 09/01/2016, 7:05 PM  [place] [address] [place], [name], [address] Phone: [phone]   Fax:  [phone]  Name: Ronald Noble MRN: [id number] Date of Birth: 08/10/1945   Vianne Bulls, OTR/L [facility] [address] [place], [place]  [phone] phone [phone] 09/01/16 7:05 PM

## 2016-09-01 NOTE — Therapy (Signed)
[place] [address] [place], [name], [address] Phone: [phone]   Fax:  [phone]  Speech Language Pathology Evaluation  Patient Details  Name: Ronald Noble MRN: [id number] Date of Birth: 07-Oct-1945 Referring Provider: Star Age, MD  Encounter Date: 08/27/2016      End of Session - 09/01/16 0830    Visit Number 1   Number of Visits 17   Date for SLP Re-Evaluation 10/30/16   SLP Start Time 0805   SLP Stop Time  0845   SLP Time Calculation (min) 40 min   Activity Tolerance Patient tolerated treatment well      Past Medical History:  Diagnosis Date  . BPH (benign prostatic hyperplasia)   . Parkinson disease ([place])   . Skin cancer of face    arms and back  . Sleep apnea    mild  . Spider bite    brown recluse/ left ankle/08/13    Past Surgical History:  Procedure Laterality Date  . BURR HOLE W/ STEREOTACTIC INSERTION OF DBS LEADS / INTRAOP MICROELECTRODE RECORDING    . COLONOSCOPY    . KNEE SURGERY     exploratory/left  . SKIN CANCER EXCISION  01/2014   face  . TRANSURETHRAL RESECTION OF PROSTATE     2008    There were no vitals filed for this visit.      Subjective Assessment - 09/01/16 0826    Subjective Pt reports DBS placed March 2016.    Currently in Pain? Yes   Pain Score 3    Pain Location Back   Pain Orientation Mid;Lower   Pain Descriptors / Indicators Sharp   Pain Type Acute pain   Pain Onset 1 to 4 weeks ago   Pain Frequency Intermittent   Aggravating Factors  stnad up   Pain Relieving Factors steroids, walking            SLP Evaluation [facility] - 09/01/16 7035      SLP Visit Information   SLP Received On 08/27/16   Referring Provider Star Age, MD   Onset Date 2012   Medical Diagnosis Parkinson's Disease     General Information   HPI Pt well-known to this SLP as he has been seen for previous ST courses of therapy for reduced speech volume.  Pt was  screened last month and found to have low-normal volume and intermittent short rushes of speech which degraded intelligibility in conversation. 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      Prior Functional Status   Cognitive/Linguistic Baseline Baseline deficits     Cognition   Overall Cognitive Status History of cognitive impairments - at baseline     Auditory Comprehension   Overall Auditory Comprehension Appears within functional limits for tasks assessed     Verbal Expression   Overall Verbal Expression Appears within functional limits for tasks assessed     Oral Motor/Sensory Function   Labial Coordination Reduced   Lingual Coordination Reduced   Facial ROM Reduced right;Reduced left   Facial Coordination Reduced   Overall Oral Motor/Sensory Function Mild hypomimia noted. Redued coordination of articulators. Breath support appeared adqueate, however phonation was softer with incr'd cognitive load (<70dB average)     Motor Speech   Overall Motor Speech Impaired   Respiration Impaired   Level of Impairment Phrase   Phonation Low vocal intensity  with longer verbal stimuli and incr'd cognitive load   Articulation Impaired   Level of  Impairment Sentence   Intelligibility Intelligibility reduced   Sentence --  90   Conversation --  approx 95% in context;90% out of context   Effective Techniques Increased vocal intensity;Over-articulate      Measured when a sound level meter was placed 30 cm away from pt's mouth, 5 minutes of conversational speech was reduced today, at average 68dB (WNL= average 70-72dB) with range of 62-72dB. Overall speech intelligibility for this listener in a quiet environment was 90-95%. Production of loud /a/ averaged low 90s dB (range of 84 to 93) and min cues usually needed for loudness. Pt was asked to use as much effort as he did with loud /a/ in conersation task of 3 minutes and loudness average increased to 70dB (range of 66 to  75) with rare A occasionally needed for loudness. Pt would benefit from skilled ST to improve speech intelligibility.                       SLP Short Term Goals - 09/01/16 0836      SLP SHORT TERM GOAL #1   Title Pt will average 93dB on loud /a/ over 3 sessions with rare min A   Time 4   Period Weeks   Status New     SLP SHORT TERM GOAL #2   Title Pt will average 72dB on structured speech tasks with rare min A   Time 4   Period Weeks   Status New     SLP SHORT TERM GOAL #3   Title Pt will maintain average low 70s dB over 8 minute simple conversation with rare min A   Time 4   Period Weeks     SLP SHORT TERM GOAL #4   Title pt will maintain speech intelligibility of 95%+ for 8 minutes simple conversation over three sesssions   Time 4   Period Weeks   Status New          SLP Long Term Goals - 09/01/16 7106      SLP LONG TERM GOAL #1   Title Pt will maintain 70dB over 15 minute mod complex conversation with rare min A over three therapy sessions   Time 8   Period Weeks   Status New     SLP LONG TERM GOAL #2   Title Pt will maintain 95%+ speech intelligibility in noisy environment outside of tx room over 12 minutes with rare min A, over three sessions   Time 8   Period Weeks   Status New          Plan - 09/01/16 0831    Clinical Impression Statement Pt presents with hypophonia, especially with incr'd cognitive load, and reduced articulatory precision including fast rushes of speech which degrade his speech intelligibility to approx 90-95% at sentence and conversational levels. Pt also has some degree of cognitive communication deficits - formal assessment will be completed ASAP and goals added PRN. Skilled ST is necessary for recalibration of speech musculature for production of clear, intelligible speech. 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     Speech Therapy Frequency 2x / week   Duration --  8 weeks   Treatment/Interventions Compensatory strategies;Patient/family education;Multimodal communcation approach;Internal/external aids;SLP instruction and feedback;Functional tasks;Cueing hierarchy  HEP   Potential to Achieve Goals Fair   Potential Considerations Severity of impairments      Patient will benefit from skilled  therapeutic intervention in order to improve the following deficits and impairments:   Dysarthria and anarthria  Cognitive communication deficit    Problem List Patient Active Problem List   Diagnosis Date Noted  . Parkinson disease ([place])     [place], [place]  09/01/2016, 8:41 AM  [facility] [address] [place], [name], [address] Phone: [phone]   Fax:  [phone]  Name: Ronald Noble MRN: [id number] Date of Birth: 1945-12-19

## 2016-09-02 ENCOUNTER — Ambulatory Visit: Payer: Medicare Other | Admitting: Physical Therapy

## 2016-09-02 ENCOUNTER — Ambulatory Visit: Payer: Medicare Other

## 2016-09-02 ENCOUNTER — Encounter: Payer: Self-pay | Admitting: Physical Therapy

## 2016-09-02 DIAGNOSIS — R2689 Other abnormalities of gait and mobility: Secondary | ICD-10-CM | POA: Diagnosis not present

## 2016-09-02 DIAGNOSIS — R2681 Unsteadiness on feet: Secondary | ICD-10-CM

## 2016-09-02 DIAGNOSIS — R29898 Other symptoms and signs involving the musculoskeletal system: Secondary | ICD-10-CM | POA: Diagnosis not present

## 2016-09-02 DIAGNOSIS — R293 Abnormal posture: Secondary | ICD-10-CM | POA: Diagnosis not present

## 2016-09-02 DIAGNOSIS — R471 Dysarthria and anarthria: Secondary | ICD-10-CM

## 2016-09-02 DIAGNOSIS — R41841 Cognitive communication deficit: Secondary | ICD-10-CM

## 2016-09-02 DIAGNOSIS — R278 Other lack of coordination: Secondary | ICD-10-CM | POA: Diagnosis not present

## 2016-09-02 DIAGNOSIS — R29818 Other symptoms and signs involving the nervous system: Secondary | ICD-10-CM | POA: Diagnosis not present

## 2016-09-02 NOTE — Therapy (Signed)
[facility] [address] [place], [name], [address] Phone: [phone]   Fax:  [phone]  Physical Therapy Treatment  Patient Details  Name: Ronald Noble MRN: [id number] Date of Birth: 07-Nov-1945 Referring Provider: Rexene Alberts  Encounter Date: 09/02/2016      PT End of Session - 09/02/16 1315    Visit Number 2   Number of Visits 17   Date for PT Re-Evaluation 10/26/16   Authorization Type Medicare Primary, BCBS 2nd-GCODE every 10th visit   PT Start Time 1152   PT Stop Time 1230   PT Time Calculation (min) 38 min   Behavior During Therapy [facility] for tasks assessed/performed      Past Medical History:  Diagnosis Date  . BPH (benign prostatic hyperplasia)   . Parkinson disease ([place])   . Skin cancer of face    arms and back  . Sleep apnea    mild  . Spider bite    brown recluse/ left ankle/08/13    Past Surgical History:  Procedure Laterality Date  . BURR HOLE W/ STEREOTACTIC INSERTION OF DBS LEADS / INTRAOP MICROELECTRODE RECORDING    . COLONOSCOPY    . KNEE SURGERY     exploratory/left  . SKIN CANCER EXCISION  01/2014   face  . TRANSURETHRAL RESECTION OF PROSTATE     2008    There were no vitals filed for this visit.      Subjective Assessment - 09/02/16 1154    Subjective Pt started back exercising with stationary  bike and elliptical 10 min each and UE strengthening.   Currently in Pain? Yes   Pain Score 5    Pain Location Back   Pain Orientation Mid;Lower   Pain Descriptors / Indicators Sharp   Pain Type Acute pain   Pain Onset 1 to 4 weeks ago   Pain Frequency Intermittent                            PWR [facility]) - 09/02/16 1604 STANDING   PWR! Up x20   PWR! Rock [employer]! Twist x20   PWR Step x20   Comments cues for technique          Balance Exercises - 09/02/16 1604      Balance Exercises: Standing   Stepping Strategy  Anterior;Posterior;Lateral;Foam/compliant surface  Intermittent UE support, progressing with head turns.           PT Education - 09/02/16 1606    Education provided Yes   Education Details HEP PWR! Moves standing.  How to monitor back pain when getting back into exercising.   Person(s) Educated Patient   Methods Explanation;Demonstration;Verbal cues;Handout   Comprehension Verbalized understanding;Returned demonstration;Verbal cues required;Need further instruction          PT Short Term Goals - 08/27/16 1323      PT SHORT TERM GOAL #1   Title Pt will be independent with HEP for improved balance, posture, functional mobility.  TARGET 09/26/16   Time 4   Period Weeks   Status New     PT SHORT TERM GOAL #2   Title Pt will perform at least 8 of 10 reps of sit<>stand transfers from <18 surfaces, independently, no posterior lean, for improved transfer efficiency and safety.   Time 4   Period Weeks   Status New     PT SHORT TERM GOAL #3   Title Pt will verbalize at  least 3 means to reduce pain in low back with functional activities.   Time 4   Period Weeks   Status New     PT SHORT TERM GOAL #4   Title Pt will improve posterior push and release test to less than 2 steps to independently recover balance in posterior direction.   Time 4   Period Weeks   Status New           PT Long Term Goals - 08/27/16 1325      PT LONG TERM GOAL #1   Title Pt will verbalize understanding of fall prevention in home environment.  TARGET 10/26/16   Time 8   Period Weeks   Status New     PT LONG TERM GOAL #2   Title Pt will improve MiniBESTest score to at least 22/28 for decreased fall risk.   Time 8   Period Weeks   Status New     PT LONG TERM GOAL #3   Title Pt will verbalize understanding of techniques to reduce festination/freezing of gait.   Time 8   Period Weeks   Status New     PT LONG TERM GOAL #4   Title Pt will verbalize plans for continued community fitness upon  D/C from PT.   Time 8   Period Weeks   Status New     PT LONG TERM GOAL #5   Title Pt will ambulate at least 1000 ft, indoor and outdoor surfaces, independently, no LOB, for improved community gait.   Time 8   Period Weeks   Status New               Plan - 09/02/16 1608    Clinical Impression Statement Pt's back pain seems to being doing much better than last visit; pt had no increase back pain during activity in PT today.  Initiated HEP PWR! Moves standing; pt able to perform without UE support and min cues for technique.  Instructed pt on how to monitor back pain when getting back into exercising and addressed back pain through posture/positioning education with ADLs  (specifically upper trunk rotation).                                                                                     Rehab Potential Good   PT Frequency 2x / week   PT Duration 8 weeks  plus eval   PT Treatment/Interventions ADLs/Self Care Home Management;Functional mobility training;Gait training;Patient/family education;Therapeutic activities;Therapeutic exercise;Balance training;Neuromuscular re-education   PT Next Visit Plan Initiate HEP-PWR! Moves-quadruped, standing, sitting (in conjunction with OT); sit<>stand with proper technique; address back pain through posture/positioning education with ADLs   Consulted and Agree with Plan of Care Patient      Patient will benefit from skilled therapeutic intervention in order to improve the following deficits and impairments:  Abnormal gait, Decreased balance, Decreased mobility, Decreased strength, Difficulty walking, Postural dysfunction, Pain  Visit Diagnosis: Other symptoms and signs involving the musculoskeletal system  Abnormal posture  Unsteadiness on feet  Other abnormalities of gait and mobility     Problem List Patient Active Problem List   Diagnosis Date Noted  .  Parkinson disease [facility])     Bjorn Loser, PTA  09/02/16, 4:14 PM Ronald Noble [address] [place], [name], [address] Phone: [phone]   Fax:  [phone]  Name: Ronald Noble MRN: [id number] Date of Birth: 1945-12-20

## 2016-09-02 NOTE — Therapy (Signed)
[facility] [address] [place], [name], [address] Phone: [phone]   Fax:  [phone]  Speech Language Pathology Treatment  Patient Details  Name: Ronald Noble MRN: [id number] Date of Birth: July 12, 1945 Referring Provider: Star Age, MD  Encounter Date: 09/02/2016      End of Session - 09/02/16 1717    Visit Number 2   Number of Visits 17   Date for SLP Re-Evaluation 10/30/16   SLP Start Time 0812  pt 11 minutes late   SLP Stop Time  0845   SLP Time Calculation (min) 33 min   Activity Tolerance Patient tolerated treatment well      Past Medical History:  Diagnosis Date  . BPH (benign prostatic hyperplasia)   . Parkinson disease ([place])   . Skin cancer of face    arms and back  . Sleep apnea    mild  . Spider bite    brown recluse/ left ankle/08/13    Past Surgical History:  Procedure Laterality Date  . BURR HOLE W/ STEREOTACTIC INSERTION OF DBS LEADS / INTRAOP MICROELECTRODE RECORDING    . COLONOSCOPY    . KNEE SURGERY     exploratory/left  . SKIN CANCER EXCISION  01/2014   face  . TRANSURETHRAL RESECTION OF PROSTATE     2008    There were no vitals filed for this visit.      Subjective Assessment - 09/02/16 0847    Subjective Pt taking PD meds upon arrival at 0811.   Currently in Pain? No/denies               ADULT SLP TREATMENT - 09/02/16 0848      General Information   Behavior/Cognition Alert;Cooperative;Pleasant mood     Treatment Provided   Treatment provided Cognitive-Linquistic     Cognitive-Linquistic Treatment   Treatment focused on Dysarthria   Skilled Treatment Loud /a/ used to recalibrate pt's conversational loudness with average low to mid 90s dB. Simple conversation   -  pt averaged 67dB over 5 minutes conversation with rare min A. Pt's best responses (WNL loudness) were no longer than 1 sentence in response. UInfortunately, pt persisted with 2-3  sentence responses and req'd min-mod A occasionally to keep answers at 1 sentence. Pt began making a chnage to utterance/response length as SLP continued to tell pt his suboptimal dB average with extended responses of 2+ sentences. SLP educated pt on the importance of regimented medication administration and suggested alarms on his phone, given his "s" today and stating he doesn't really take his meds some days until he feels he's "starting to feel different.".     Assessment / Recommendations / Plan   Plan Continue with current plan of care     Progression Toward Goals   Progression toward goals Progressing toward goals          SLP Education - 09/02/16 1716    Education provided Yes   Education Details medication adminstration, alarms for med administration to standardize timing of meds   Person(s) Educated Patient   Methods Explanation   Comprehension Verbalized understanding          SLP Short Term Goals - 09/02/16 1719      SLP SHORT TERM GOAL #1   Title Pt will average 93dB on loud /a/ over 3 sessions with rare min A   Time 4   Period Weeks   Status On-going     SLP SHORT TERM GOAL #2  Title Pt will average 72dB on structured speech tasks with rare min A   Time 4   Period Weeks   Status On-going     SLP SHORT TERM GOAL #3   Title Pt will maintain average low 70s dB over 8 minute simple conversation with rare min A   Time 4   Period Weeks   Status On-going     SLP SHORT TERM GOAL #4   Title pt will maintain speech intelligibility of 95%+ for 8 minutes simple conversation over three sesssions   Time 4   Period Weeks   Status New          SLP Long Term Goals - 09/02/16 1720      SLP LONG TERM GOAL #1   Title Pt will maintain 70dB over 15 minute mod complex conversation with rare min A over three therapy sessions   Time 8   Period Weeks   Status On-going     SLP LONG TERM GOAL #2   Title Pt will maintain 95%+ speech intelligibility in noisy environment  outside of tx room over 12 minutes with rare min A, over three sessions   Time 8   Period Weeks   Status On-going          Plan - 09/02/16 1718    Clinical Impression Statement Pt cont to present with hypophonia, especially with incr'd cognitive load, and occasional reduced articulatory precision including fast rushes of speech. Speech intelligibility at sentence and conversational levels is degraded. Formal cognitve communicaiton assessment will be completed ASAP and goals added PRN. Skilled ST is necessary for recalibration of speech musculature for production of clear, intelligible speech.    Speech Therapy Frequency 2x / week   Duration --  8 weeks   Treatment/Interventions Compensatory strategies;Patient/family education;Multimodal communcation approach;Internal/external aids;SLP instruction and feedback;Functional tasks;Cueing hierarchy  HEP   Potential to Achieve Goals Fair   Potential Considerations Severity of impairments      Patient will benefit from skilled therapeutic intervention in order to improve the following deficits and impairments:   Dysarthria and anarthria  Cognitive communication deficit    Problem List Patient Active Problem List   Diagnosis Date Noted  . Parkinson disease ([place])     [place] ,[place], [place]  09/02/2016, 5:20 PM  [facility] [address] [place], [name], [address] Phone: [phone]   Fax:  [phone]   Name: Ronald Noble MRN: [id number] Date of Birth: 1945/10/25

## 2016-09-07 ENCOUNTER — Ambulatory Visit: Payer: Medicare Other | Attending: Neurology | Admitting: Rehabilitation

## 2016-09-07 ENCOUNTER — Encounter: Payer: Self-pay | Admitting: Rehabilitation

## 2016-09-07 DIAGNOSIS — R471 Dysarthria and anarthria: Secondary | ICD-10-CM | POA: Diagnosis not present

## 2016-09-07 DIAGNOSIS — M25611 Stiffness of right shoulder, not elsewhere classified: Secondary | ICD-10-CM | POA: Diagnosis not present

## 2016-09-07 DIAGNOSIS — R29898 Other symptoms and signs involving the musculoskeletal system: Secondary | ICD-10-CM | POA: Diagnosis not present

## 2016-09-07 DIAGNOSIS — R41840 Attention and concentration deficit: Secondary | ICD-10-CM | POA: Insufficient documentation

## 2016-09-07 DIAGNOSIS — R278 Other lack of coordination: Secondary | ICD-10-CM | POA: Diagnosis not present

## 2016-09-07 DIAGNOSIS — R41841 Cognitive communication deficit: Secondary | ICD-10-CM | POA: Insufficient documentation

## 2016-09-07 DIAGNOSIS — R293 Abnormal posture: Secondary | ICD-10-CM | POA: Insufficient documentation

## 2016-09-07 DIAGNOSIS — R29818 Other symptoms and signs involving the nervous system: Secondary | ICD-10-CM | POA: Insufficient documentation

## 2016-09-07 DIAGNOSIS — R2681 Unsteadiness on feet: Secondary | ICD-10-CM | POA: Insufficient documentation

## 2016-09-07 DIAGNOSIS — M25612 Stiffness of left shoulder, not elsewhere classified: Secondary | ICD-10-CM | POA: Insufficient documentation

## 2016-09-07 DIAGNOSIS — R2689 Other abnormalities of gait and mobility: Secondary | ICD-10-CM | POA: Insufficient documentation

## 2016-09-07 NOTE — Therapy (Signed)
[facility] [address] [place], [name], [address] Phone: [phone]   Fax:  [phone]  Physical Therapy Treatment  Patient Details  Name: Ronald Noble MRN: [id number] Date of Birth: November 17, 1945 Referring Provider: Rexene Alberts  Encounter Date: 09/07/2016      PT End of Session - 09/07/16 1351    Visit Number 3   Number of Visits 17   Date for PT Re-Evaluation 10/26/16   Authorization Type Medicare Primary, BCBS 2nd-GCODE every 10th visit   PT Start Time 1331  pt late to session   PT Stop Time 1402   PT Time Calculation (min) 31 min   Behavior During Therapy [facility] for tasks assessed/performed      Past Medical History:  Diagnosis Date  . BPH (benign prostatic hyperplasia)   . Parkinson disease ([place])   . Skin cancer of face    arms and back  . Sleep apnea    mild  . Spider bite    brown recluse/ left ankle/08/13    Past Surgical History:  Procedure Laterality Date  . BURR HOLE W/ STEREOTACTIC INSERTION OF DBS LEADS / INTRAOP MICROELECTRODE RECORDING    . COLONOSCOPY    . KNEE SURGERY     exploratory/left  . SKIN CANCER EXCISION  01/2014   face  . TRANSURETHRAL RESECTION OF PROSTATE     2008    There were no vitals filed for this visit.      Subjective Assessment - 09/07/16 1333    Subjective Pt reports back pain much better with increased exercises.    Pertinent History s/p DBS, skin cancer, sleep apnea, BPH, TURP, orthostatic hypotension, syncopal episodes; arthritic changes in spine   Patient Stated Goals Pt's goals for therapy are to improve strength, posture, and agility with movement.   Currently in Pain? No/denies                         [facility] Adult PT Treatment/Exercise - 09/07/16 0001      Neuro Re-ed    Neuro Re-ed Details  Performed 180 deg stepping from // bars to chair with no more than 2 steps with UE support x 5 reps in each direction.  Note did exercise  while standing on red gym mat.  Cues for larger steps, esp to the L.  Corner balance standing on compliant surface with feet apart EC x 3 sets of 30 secs with slight increase in sway but able to self correct.  Feet together EC x 15 secs x 2 sets with marked increase in lateral sway esp to the R with cues to correct.       Exercises   Exercises Other Exercises   Other Exercises  supine hip flexor stretch on EOM due to noted tightness in hips with PWR exercises.  Provided this for HEP, see pt instruction for details.             PWR [facility]) - 09/07/16 1452    PWR! Up x 10 reps   PWR! Rock x 10 reps   PWR! Twist x 10 reps each side   PWR! Step x 10 reps on each side   Comments Has been performing PWR! twist at home with no increase in back pain, therefore maintained this exercise as is.               PT Education - 09/07/16 1657    Education provided Yes   Education  Details additional hip flexor stretch for HEP   Person(s) Educated Patient   Methods Explanation;Demonstration;Handout   Comprehension Verbalized understanding;Returned demonstration          PT Short Term Goals - 08/27/16 1323      PT SHORT TERM GOAL #1   Title Pt will be independent with HEP for improved balance, posture, functional mobility.  TARGET 09/26/16   Time 4   Period Weeks   Status New     PT SHORT TERM GOAL #2   Title Pt will perform at least 8 of 10 reps of sit<>stand transfers from <18 surfaces, independently, no posterior lean, for improved transfer efficiency and safety.   Time 4   Period Weeks   Status New     PT SHORT TERM GOAL #3   Title Pt will verbalize at least 3 means to reduce pain in low back with functional activities.   Time 4   Period Weeks   Status New     PT SHORT TERM GOAL #4   Title Pt will improve posterior push and release test to less than 2 steps to independently recover balance in posterior direction.   Time 4   Period Weeks   Status New           PT Long  Term Goals - 08/27/16 1325      PT LONG TERM GOAL #1   Title Pt will verbalize understanding of fall prevention in home environment.  TARGET 10/26/16   Time 8   Period Weeks   Status New     PT LONG TERM GOAL #2   Title Pt will improve MiniBESTest score to at least 22/28 for decreased fall risk.   Time 8   Period Weeks   Status New     PT LONG TERM GOAL #3   Title Pt will verbalize understanding of techniques to reduce festination/freezing of gait.   Time 8   Period Weeks   Status New     PT LONG TERM GOAL #4   Title Pt will verbalize plans for continued community fitness upon D/C from PT.   Time 8   Period Weeks   Status New     PT LONG TERM GOAL #5   Title Pt will ambulate at least 1000 ft, indoor and outdoor surfaces, independently, no LOB, for improved community gait.   Time 8   Period Weeks   Status New               Plan - 09/07/16 1658    Clinical Impression Statement Pt continues to demo decreased back pain with increased exericses.  Tolerated quadruped PWR! exercises well today with min cues for posture on posture and transition exercise.  Added hip flexor stretch to assist with posture and also worked on stepping strategy to carryover to gait.  Tolerated well.     Rehab Potential Good   PT Frequency 2x / week   PT Duration 8 weeks  plus eval   PT Treatment/Interventions ADLs/Self Care Home Management;Functional mobility training;Gait training;Patient/family education;Therapeutic activities;Therapeutic exercise;Balance training;Neuromuscular re-education   PT Next Visit Plan Initiated HEP-PWR! Moves-quadruped, standing, sitting (in conjunction with OT); sit<>stand with proper technique; address back pain through posture/positioning education with ADLs   Consulted and Agree with Plan of Care Patient      Patient will benefit from skilled therapeutic intervention in order to improve the following deficits and impairments:  Abnormal gait, Decreased balance,  Decreased mobility, Decreased strength, Difficulty walking, Postural  dysfunction, Pain  Visit Diagnosis: Abnormal posture  Unsteadiness on feet  Other abnormalities of gait and mobility     Problem List Patient Active Problem List   Diagnosis Date Noted  . Parkinson disease ([place])    Cameron Sprang, PT, MPT [facility] [address] [place], [name], [address] Phone: [phone]   Fax:  [phone] 09/07/16, 5:01 PM  Name: Ronald Noble MRN: [id number] Date of Birth: June 24, 1945

## 2016-09-07 NOTE — Patient Instructions (Signed)
Hip Flexor Stretch    Lying on back near edge of bed, bend one leg, foot flat. Hang other leg over edge, relaxed, thigh resting entirely on bed for _1-2___ minutes. Repeat __2-3__ times. Do _2___ sessions per day. Advanced Exercise: Bend knee back keeping thigh in contact with bed.  http://gt2.exer.us/346   Copyright  VHI. All rights reserved.

## 2016-09-08 ENCOUNTER — Ambulatory Visit: Payer: Medicare Other | Admitting: Occupational Therapy

## 2016-09-08 ENCOUNTER — Ambulatory Visit: Payer: Medicare Other

## 2016-09-08 DIAGNOSIS — R29818 Other symptoms and signs involving the nervous system: Secondary | ICD-10-CM | POA: Diagnosis not present

## 2016-09-08 DIAGNOSIS — R278 Other lack of coordination: Secondary | ICD-10-CM | POA: Diagnosis not present

## 2016-09-08 DIAGNOSIS — R2681 Unsteadiness on feet: Secondary | ICD-10-CM | POA: Diagnosis not present

## 2016-09-08 DIAGNOSIS — R41841 Cognitive communication deficit: Secondary | ICD-10-CM

## 2016-09-08 DIAGNOSIS — R29898 Other symptoms and signs involving the musculoskeletal system: Secondary | ICD-10-CM | POA: Diagnosis not present

## 2016-09-08 DIAGNOSIS — R2689 Other abnormalities of gait and mobility: Secondary | ICD-10-CM

## 2016-09-08 DIAGNOSIS — R41840 Attention and concentration deficit: Secondary | ICD-10-CM

## 2016-09-08 DIAGNOSIS — R293 Abnormal posture: Secondary | ICD-10-CM

## 2016-09-08 DIAGNOSIS — R471 Dysarthria and anarthria: Secondary | ICD-10-CM

## 2016-09-08 NOTE — Therapy (Addendum)
[facility] [address] [place], [name], [address] Phone: [phone]   Fax:  [phone]  Occupational Therapy Treatment  Patient Details  Name: Ronald Noble MRN: [id number] Date of Birth: 01-Jun-1945 Referring Provider: Dr. Star Age  Encounter Date: 09/08/2016      OT End of Session - 09/08/16 1157    Visit Number 2   Number of Visits 17   Date for OT Re-Evaluation 10/31/16   Authorization Type Medicare/BCBS - G Code needed   Authorization - Visit Number 2   Authorization - Number of Visits 10   OT Start Time 1148   OT Stop Time 1230   OT Time Calculation (min) 42 min   Activity Tolerance Patient tolerated treatment well   Behavior During Therapy [facility] for tasks assessed/performed      Past Medical History:  Diagnosis Date  . BPH (benign prostatic hyperplasia)   . Parkinson disease ([place])   . Skin cancer of face    arms and back  . Sleep apnea    mild  . Spider bite    brown recluse/ left ankle/08/13    Past Surgical History:  Procedure Laterality Date  . BURR HOLE W/ STEREOTACTIC INSERTION OF DBS LEADS / INTRAOP MICROELECTRODE RECORDING    . COLONOSCOPY    . KNEE SURGERY     exploratory/left  . SKIN CANCER EXCISION  01/2014   face  . TRANSURETHRAL RESECTION OF PROSTATE     2008    There were no vitals filed for this visit.      Subjective Assessment - 09/08/16 1150    Subjective  Pt reports that I'm feeling good (just took medication)   Pertinent History PD since 2008, s/p DBS 06/2014, skin cancer, sleep apnea, BPH, TURP, orthostatic hypotension, syncopal episodes; arthritic changes in spine   Limitations DBS, falls, orthostatic hypotension   Patient Stated Goals improve finger dexterity   Currently in Pain? No/denies            [facility] OT Assessment - 09/08/16 0001      Observation/Other Assessments   Standing Functional Reach Test 14" bilaterally                          OT Education - 09/08/16 1204    Education Details PWR! hands (basic 4);  Coordination HEP   Person(s) Educated Patient   Methods Explanation;Demonstration;Verbal cues;Handout   Comprehension Verbalized understanding;Returned demonstration;Verbal cues required  mod-max difficulty with rotating ball in fingertips          OT Short Term Goals - 09/01/16 1851      OT SHORT TERM GOAL #1   Title Pt will be independent with updated PD specific HEP--STGs due 10/01/16   Time 4   Period Weeks   Status New     OT SHORT TERM GOAL #2   Title Pt will improve L hand coordination for ADLs as shown by improving time on 9-hole peg test by at least 6sec with LUE.   Baseline L-48.06sec   Time 4   Period Weeks   Status New     OT SHORT TERM GOAL #3   Title Pt will improve coordination/functional reaching for ADLs as shown by improving score on box and blocks test by at least 4 blocks   Baseline L-39 blocks   Time 4   Period Weeks   Status New     OT SHORT TERM GOAL #4  Title Pt will be able to fasten/unfasten 3 button in less than 90sec (for improved ability to dress and improved bilateral hand coordination).   Baseline >12min/unable on tabletop   Time 4   Period Weeks   Status New     OT SHORT TERM GOAL #5   Title Pt will verbalize understanding of updated community resources prn.   Time 4   Period Weeks   Status New           OT Long Term Goals - 09/08/16 2116      OT LONG TERM GOAL #1   Title Pt will verbalize understanding of adaptive strategies to incr ease with ADL's/IADLS's--LTGs due 8/33/82   Time 8   Period Weeks   Status New     OT LONG TERM GOAL #2   Title Pt will improve coordination for ADLs as shown by improving time on 9-hole peg test by at least 12sec with LUE.   Baseline 48.06sec   Time 8   Period Weeks   Status New     OT LONG TERM GOAL #3   Title Pt will improve functional reaching/coordination for ADLs as shown  by improving score on box and blocks test by at least 8blocks with LUE.     OT LONG TERM GOAL #4   Title Pt will be able to fasten/unfasten 3 button in less than 70sec (for improved ability to dress and improved bilateral hand coordination).   Baseline unable, >65min   Time 8   Period Weeks   Status New     OT LONG TERM GOAL #5   Title Assess standing functional reach and add goal prn   Time 8   Status Deferred  09/08/16:  WNL               Plan - 09/08/16 1212    Clinical Impression Statement Pt reports that he is in an "on" phase with medication.  Pt demo difficulty with in-hand manipulation and posture/avoiding shoulder hike with coordination/UE movements.   Rehab Potential Good   Current Impairments/barriers affecting progress: decr awareness, s/p DBS   OT Frequency 2x / week   OT Duration 8 weeks  +eval   OT Treatment/Interventions Self-care/ADL training;Moist Heat;DME and/or AE instruction;Patient/family education;Therapeutic exercises;Therapeutic activities;Neuromuscular education;Functional Mobility Training;Passive range of motion;Manual Therapy;Cognitive remediation/compensation;Visual/perceptual remediation/compensation;Energy conservation   Plan functional reaching with coordination, in-hand manipulation, PWR! moves for stretch/posture   OT Home Exercise Plan 11/14/15: PWR! Seated; PWR! supine; 11/19/15  PWR! hands; 11/25/15 coordination HEP. 11/28/15: PWR! Quadraped. 12/03/15: additional coordination HEP; PWR! moves in prone and standing, PD ex. chart 12/05/15    Consulted and Agree with Plan of Care Patient      Patient will benefit from skilled therapeutic intervention in order to improve the following deficits and impairments:  Decreased coordination, Difficulty walking, Impaired flexibility, Improper body mechanics, Decreased endurance, Decreased safety awareness, Improper spinal/pelvic alignment, Decreased knowledge of precautions, Decreased activity tolerance, Impaired  tone, Decreased balance, Decreased knowledge of use of DME, Impaired UE functional use, Decreased cognition, Decreased mobility, Impaired vision/preception, Impaired perceived functional ability  Visit Diagnosis: Other symptoms and signs involving the nervous system  Other symptoms and signs involving the musculoskeletal system  Abnormal posture  Other abnormalities of gait and mobility  Unsteadiness on feet  Other lack of coordination  Attention and concentration deficit    Problem List Patient Active Problem List   Diagnosis Date Noted  . Parkinson disease ([place])     [facility] 09/08/2016, 9:17  PM  [facility] [address] [place], [name], [address] Phone: [phone]   Fax:  [phone]  Name: Ronald Noble MRN: [id number] Date of Birth: December 29, 1945   Vianne Bulls, OTR/L [facility] [address] [place], [place]  [address] [phone] phone [phone] 09/08/16 9:17 PM

## 2016-09-08 NOTE — Patient Instructions (Addendum)
PWR! Hands  With arms stretched out in front of you (elbows straight), perform the following:  PWR! Rock: Move wrists up and down [employer]! Twist: Twist palms up and down BIG  Then, start with elbows bent and hands closed.  PWR! Step: Touch index finger to thumb while keeping other fingers straight. Flick fingers out BIG (thumb out/straighten fingers). Repeat with other fingers. (Step your thumb to each finger).  PWR! Hands: Push hands out BIG. Elbows straight, wrists up, fingers open and spread apart BIG. (Can also perform by pushing down on table, chair, knees. Push above head, out to the side, behind you, in front of you.)   ** Make each movement big and deliberate so that you feel the movement.  Perform at least 10 repetitions 1x/day, but perform PWR! hands throughout the day when you are having trouble using your hands (picking up/manipulating small objects, writing, eating, typing, sewing, buttoning, etc.).    Coordination Exercises  Perform the following exercises for 20 minutes 1 times per day. Perform with both hand(s). Perform using big movements.   Flipping Cards: Place deck of cards on the table. Flip cards over by opening your hand big to grasp and then turn your palm up big, opening hand fully to release.  Deal cards: Hold 1/2 or whole deck in your hand. Use thumb to push card off top of deck with one big push.  Place card on tabletop. Then flick fingers (extend fingers) powerfully to slide card off table (can have chair/box below table to catch the cards).  Rotate ball with fingertips: Pick up with fingers/thumb and move as much as you can with each turn/movement (clockwise and counter-clockwise).  Toss ball from one hand to the other: Toss big/high.  Deliberately open with toss and deliberately close hand after catch.  Toss ball in the air and catch with the same hand: Toss big/high.  Deliberately open with toss and deliberately close hand after catch.  Rotate 2  golf balls in your hand: Both directions.  Pick up coins and stack one at a time: Pick up with big, intentional movements. Do not drag coin to the edge. (5-10 in a stack)  Pick up 5-10 coins one at a time and hold in palm. Then, move coins from palm to fingertips one at time and place in coin bank/container.  Practice writing: Slow down, write big, and focus on forming each letter.  Practice typing.

## 2016-09-10 ENCOUNTER — Encounter: Payer: Medicare Other | Admitting: Speech Pathology

## 2016-09-10 ENCOUNTER — Ambulatory Visit: Payer: Medicare Other | Admitting: Physical Therapy

## 2016-09-10 ENCOUNTER — Encounter: Payer: Medicare Other | Admitting: Occupational Therapy

## 2016-09-10 NOTE — Patient Instructions (Signed)
Try and be as regimented as you can with the timing of your PD meds - timing is everything with this type of medication!

## 2016-09-10 NOTE — Therapy (Signed)
Ronald Noble, Ronald Noble, Ronald Noble Phone: [phone]   Fax:  [phone]  Speech Language Pathology Treatment  Patient Details  Name: Ronald Noble MRN: [id number] Date of Birth: Aug 08, 1945 Referring Provider: Star Age, MD  Encounter Date: 09/08/2016      End of Session - 09/10/16 1144    Visit Number 3   Number of Visits 17   Date for SLP Re-Evaluation 10/30/16   SLP Start Time 46   SLP Stop Time  1400   SLP Time Calculation (min) 42 min   Activity Tolerance Patient tolerated treatment well      Past Medical History:  Diagnosis Date  . BPH (benign prostatic hyperplasia)   . Parkinson disease ([place])   . Skin cancer of face    arms and back  . Sleep apnea    mild  . Spider bite    brown recluse/ left ankle/08/13    Past Surgical History:  Procedure Laterality Date  . BURR HOLE W/ STEREOTACTIC INSERTION OF DBS LEADS / INTRAOP MICROELECTRODE RECORDING    . COLONOSCOPY    . KNEE SURGERY     exploratory/left  . SKIN CANCER EXCISION  01/2014   face  . TRANSURETHRAL RESECTION OF PROSTATE     2008    There were no vitals filed for this visit.             ADULT SLP TREATMENT - 09/10/16 0001      General Information   Behavior/Cognition Alert;Cooperative;Pleasant mood     Treatment Provided   Treatment provided Cognitive-Linquistic     Cognitive-Linquistic Treatment   Treatment focused on Dysarthria   Skilled Treatment Loud /a/ used to recalibrate pt's conversational loudness with average low to mid 90s dB. In sentence responses pt's loudness and speech rate were WNL and functional-WNL, respectively, requiring no cues.  Simple conversation   -  pt averaged 67dB over 5 minutes conversation with rare min A. Pt's best responses (WNL loudness) were no longer than 1 sentence in response. UInfortunately, pt persisted with 2-3 sentence responses and req'd min-mod A  occasionally to keep answers at 1 sentence. Pt began making a chnage to utterance/response length as SLP continued to tell pt his suboptimal dB average with extended responses of 2+ sentences. SLP educated pt on the importance of regimented medication administration and suggested alarms on his phone, given his "s" today and stating he doesn't really take his meds some days until he feels he's "starting to feel different.".     Assessment / Recommendations / Plan   Plan Continue with current plan of care     Progression Toward Goals   Progression toward goals Progressing toward goals          SLP Education - 09/10/16 1144    Education provided Yes   Education Details need for regimentation of timing of PD med administration   Person(s) Educated Patient   Methods Explanation   Comprehension Verbalized understanding          SLP Short Term Goals - 09/10/16 1146      SLP SHORT TERM GOAL #1   Title Pt will average 93dB on loud /a/ over 3 sessions with rare min A   Time 3   Period Weeks   Status On-going     SLP SHORT TERM GOAL #2   Title Pt will average 72dB on structured speech tasks with rare min A   Time 3  Period Weeks   Status On-going     SLP SHORT TERM GOAL #3   Title Pt will maintain average low 70s dB over 8 minute simple conversation with rare min A   Time 3   Period Weeks   Status On-going     SLP SHORT TERM GOAL #4   Title pt will maintain speech intelligibility of 95%+ for 8 minutes simple conversation over three sesssions   Time 3   Period Weeks   Status New          SLP Long Term Goals - 09/10/16 1146      SLP LONG TERM GOAL #1   Title Pt will maintain 70dB over 15 minute mod complex conversation with rare min A over three therapy sessions   Time 7   Period Weeks   Status On-going     SLP LONG TERM GOAL #2   Title Pt will maintain 95%+ speech intelligibility in noisy environment outside of tx room over 12 minutes with rare min A, over three  sessions   Time 7   Period Weeks   Status On-going          Plan - 09/10/16 1144    Clinical Impression Statement ncr'd cognitive load cont to result in consistent hypophonia. Occasional reduced articulatory precision including fast rushes of speech occur. Pt is doing better with loud /a/ completion. A formal cognitve communicaiton assessment will be completed next session and goals added PRN. Skilled ST is necessary for recalibration of speech musculature for production of clear, intelligible speech.    Speech Therapy Frequency 2x / week   Duration --  8 weeks   Treatment/Interventions Compensatory strategies;Patient/family education;Multimodal communcation approach;Internal/external aids;SLP instruction and feedback;Functional tasks;Cueing hierarchy  HEP   Potential to Achieve Goals Fair   Potential Considerations Severity of impairments      Patient will benefit from skilled therapeutic intervention in order to improve the following deficits and impairments:   Dysarthria and anarthria  Cognitive communication deficit    Problem List Patient Active Problem List   Diagnosis Date Noted  . Parkinson disease ([place])     [place] ,[place], [place]  09/10/2016, 11:47 AM  [facility] [address] [place] [address] [place], Ronald Noble, [address] Phone: [phone]   Fax:  [phone]   Name: Ronald Noble MRN: [id number] Date of Birth: April 07, 1945

## 2016-09-11 ENCOUNTER — Ambulatory Visit: Payer: Medicare Other

## 2016-09-11 DIAGNOSIS — R278 Other lack of coordination: Secondary | ICD-10-CM | POA: Diagnosis not present

## 2016-09-11 DIAGNOSIS — R29898 Other symptoms and signs involving the musculoskeletal system: Secondary | ICD-10-CM | POA: Diagnosis not present

## 2016-09-11 DIAGNOSIS — R293 Abnormal posture: Secondary | ICD-10-CM | POA: Diagnosis not present

## 2016-09-11 DIAGNOSIS — R471 Dysarthria and anarthria: Secondary | ICD-10-CM

## 2016-09-11 DIAGNOSIS — R29818 Other symptoms and signs involving the nervous system: Secondary | ICD-10-CM | POA: Diagnosis not present

## 2016-09-11 DIAGNOSIS — R41841 Cognitive communication deficit: Secondary | ICD-10-CM

## 2016-09-11 DIAGNOSIS — R2681 Unsteadiness on feet: Secondary | ICD-10-CM | POA: Diagnosis not present

## 2016-09-11 DIAGNOSIS — R2689 Other abnormalities of gait and mobility: Secondary | ICD-10-CM | POA: Diagnosis not present

## 2016-09-11 NOTE — Therapy (Signed)
[facility] [address] [place], [name], [address] Phone: [phone]   Fax:  [phone]  Speech Language Pathology Treatment  Patient Details  Name: Ronald Noble MRN: [id number] Date of Birth: 01/26/1946 Referring Provider: Star Age, MD  Encounter Date: 09/11/2016      End of Session - 09/11/16 1727    Visit Number 4   Number of Visits 17   Date for SLP Re-Evaluation 10/30/16   SLP Start Time 0811  pt 10 mintues late   SLP Stop Time  0845   SLP Time Calculation (min) 34 min   Activity Tolerance Patient tolerated treatment well      Past Medical History:  Diagnosis Date  . BPH (benign prostatic hyperplasia)   . Parkinson disease ([place])   . Skin cancer of face    arms and back  . Sleep apnea    mild  . Spider bite    brown recluse/ left ankle/08/13    Past Surgical History:  Procedure Laterality Date  . BURR HOLE W/ STEREOTACTIC INSERTION OF DBS LEADS / INTRAOP MICROELECTRODE RECORDING    . COLONOSCOPY    . KNEE SURGERY     exploratory/left  . SKIN CANCER EXCISION  01/2014   face  . TRANSURETHRAL RESECTION OF PROSTATE     2008    There were no vitals filed for this visit.      Subjective Assessment - 09/11/16 0814    Subjective Pt reports forgetting loud /a/.   Currently in Pain? No/denies               ADULT SLP TREATMENT - 09/11/16 0815      General Information   Behavior/Cognition Alert;Cooperative;Pleasant mood     Treatment Provided   Treatment provided Cognitive-Linquistic     Cognitive-Linquistic Treatment   Treatment focused on Cognition   Skilled Treatment SLP made a reminder for pt to put in his car to remind him to complete loud /a/. SLP used the Cognitive Linguistic Quick Test to assess pt's cognition. Pt demonstrated deficits in both memory, and high level cognition including anticipatory awareness and attention to detail. Additionally pt was notably  impulsive during testing, x3 beginning tasks before SLP completed the directions. This made a difference in the task outcome once/three times.     Assessment / Recommendations / Plan   Plan Continue with current plan of care     Progression Toward Goals   Progression toward goals Progressing toward goals          SLP Education - 09/11/16 1727    Education provided Yes   Education Details results of cognitive linguistic testing   Person(s) Educated Patient   Methods Explanation   Comprehension Verbalized understanding          SLP Short Term Goals - 09/11/16 1730      SLP SHORT TERM GOAL #1   Title Pt will average 93dB on loud /a/ over 3 sessions with rare min A   Time 3   Period Weeks   Status On-going     SLP SHORT TERM GOAL #2   Title Pt will average 72dB on structured speech tasks with rare min A   Time 3   Period Weeks   Status On-going     SLP SHORT TERM GOAL #3   Title Pt will maintain average low 70s dB over 8 minute simple conversation with rare min A   Time 3   Period Weeks  Status On-going     SLP SHORT TERM GOAL #4   Title pt will maintain speech intelligibility of 95%+ for 8 minutes simple conversation over three sesssions   Time 3   Period Weeks   Status New     SLP SHORT TERM GOAL #5   Title pt will demo knowledge of 4 different memory compenstions   Time 3   Period Weeks   Status New          SLP Long Term Goals - 09/11/16 1731      SLP LONG TERM GOAL #1   Title Pt will maintain 70dB over 15 minute mod complex conversation with rare min A over three therapy sessions   Time 7   Period Weeks   Status On-going     SLP LONG TERM GOAL #2   Title Pt will maintain 95%+ speech intelligibility in noisy environment outside of tx room over 12 minutes with rare min A, over three sessions   Time 7   Period Weeks   Status On-going     SLP LONG TERM GOAL #3   Title pt will demo one memory compensation in 3 sessions    Time 7   Period Weeks    Status New     SLP LONG TERM GOAL #4   Title pt will demo anticipatory awareness to independently double check his work in [employer] complex cognitive linguistic tasks in 7 sessions   Time 7   Period Weeks   Status New     SLP LONG TERM GOAL #5   Title in detailed cognitive linguistic tasks pt will be 100% accurate with modified independence (double-checking his work)   Time 7   Period Weeks   Status New          Plan - 09/11/16 1728    Clinical Impression Statement SLP completed cognitive testing with pt today with results highlighting deficits with memory, and higher level cognitive deficits including awareness and attention to detail. Complicating these deficits was a noted impulsivity, sometimes negating pt's ability for success on the assessment. SLP added goals for some cognitive linguistics. PT would cont to benefit from skilled ST to address recalibration of conversational loudness, compensations for memroy, and improvement in cognitive lingjuistics.   Speech Therapy Frequency 2x / week   Duration --  8 weeks   Treatment/Interventions Compensatory strategies;Patient/family education;Multimodal communcation approach;Internal/external aids;SLP instruction and feedback;Functional tasks;Cueing hierarchy  HEP   Potential to Achieve Goals Fair   Potential Considerations Severity of impairments      Patient will benefit from skilled therapeutic intervention in order to improve the following deficits and impairments:   Dysarthria and anarthria  Cognitive communication deficit    Problem List Patient Active Problem List   Diagnosis Date Noted  . Parkinson disease ([place])     [place] ,[place], [place]  09/11/2016, 5:34 PM  [facility] [address] [place], [name], [address] Phone: [phone]   Fax:  [phone]   Name: Muaz Shorey MRN: [id number] Date of Birth: 1946/03/27

## 2016-09-14 ENCOUNTER — Ambulatory Visit: Payer: Medicare Other | Admitting: Occupational Therapy

## 2016-09-14 ENCOUNTER — Ambulatory Visit: Payer: Medicare Other | Admitting: Speech Pathology

## 2016-09-14 DIAGNOSIS — R29818 Other symptoms and signs involving the nervous system: Secondary | ICD-10-CM | POA: Diagnosis not present

## 2016-09-14 DIAGNOSIS — R2681 Unsteadiness on feet: Secondary | ICD-10-CM | POA: Diagnosis not present

## 2016-09-14 DIAGNOSIS — R278 Other lack of coordination: Secondary | ICD-10-CM | POA: Diagnosis not present

## 2016-09-14 DIAGNOSIS — R2689 Other abnormalities of gait and mobility: Secondary | ICD-10-CM | POA: Diagnosis not present

## 2016-09-14 DIAGNOSIS — R293 Abnormal posture: Secondary | ICD-10-CM | POA: Diagnosis not present

## 2016-09-14 DIAGNOSIS — R29898 Other symptoms and signs involving the musculoskeletal system: Secondary | ICD-10-CM

## 2016-09-14 DIAGNOSIS — R41841 Cognitive communication deficit: Secondary | ICD-10-CM

## 2016-09-14 DIAGNOSIS — R41840 Attention and concentration deficit: Secondary | ICD-10-CM

## 2016-09-14 DIAGNOSIS — R471 Dysarthria and anarthria: Secondary | ICD-10-CM

## 2016-09-14 NOTE — Therapy (Signed)
[facility] [address] [place], [name], [address] Phone: [phone]   Fax:  [phone]  Speech Language Pathology Treatment  Patient Details  Name: Ronald Noble MRN: [id number] Date of Birth: [birth year] Referring Provider: Star Age, MD  Encounter Date: 09/14/2016      End of Session - 09/14/16 1805    Visit Number 5   Number of Visits 17   Date for SLP Re-Evaluation 10/30/16   SLP Start Time 0850   SLP Stop Time  0932   SLP Time Calculation (min) 42 min   Activity Tolerance Patient tolerated treatment well      Past Medical History:  Diagnosis Date  . BPH (benign prostatic hyperplasia)   . Parkinson disease ([place])   . Skin cancer of face    arms and back  . Sleep apnea    mild  . Spider bite    brown recluse/ left ankle/08/13    Past Surgical History:  Procedure Laterality Date  . BURR HOLE W/ STEREOTACTIC INSERTION OF DBS LEADS / INTRAOP MICROELECTRODE RECORDING    . COLONOSCOPY    . KNEE SURGERY     exploratory/left  . SKIN CANCER EXCISION  01/2014   face  . TRANSURETHRAL RESECTION OF PROSTATE     2008    There were no vitals filed for this visit.      Subjective Assessment - 09/14/16 0849    Subjective "Last time I was here I didn't talk at all; Ronald Noble made me take a test." "I was a little impulsive."   Currently in Pain? No/denies               ADULT SLP TREATMENT - 09/14/16 0851      General Information   Behavior/Cognition Alert;Cooperative;Pleasant mood   Patient Positioning Upright in chair   Oral care provided N/A     Treatment Provided   Treatment provided Cognitive-Linquistic     Pain Assessment   Pain Assessment No/denies pain     Cognitive-Linquistic Treatment   Treatment focused on Cognition   Skilled Treatment SLP cued pt to verbalize strategies to improving intelligibility, including slow rate of speech, increased vocal amplitude and breath  pacing. All the while pt spoke little to no awareness of persistent short rushes of speech, speaking on residual air, reduced volume. SLP utilized audio recording of pt's conversational speech to encourage awareness. SLP used loud /a/ and concept of effort level to recalibrate pt's loudness in conversation/structured speech tasks. With brief sentence tasks, pt averaged 85dB at 30 cm and demonstrated appropriate rate of speech in 95% of attempts. With sentences 10+ words and sentence level tasks with increased cognitive load, avg 80.5 dB, pt required consistent min-mod A to slow rate. Spontaneous conversation 70dB today, improves to 75.5 dB with min verbal cues.     Assessment / Recommendations / Plan   Plan Continue with current plan of care     Progression Toward Goals   Progression toward goals Progressing toward goals          SLP Education - 09/14/16 1805    Education provided Yes   Education Details take more frequent breaths, slow rate of speech   Person(s) Educated Patient   Methods Explanation   Comprehension Verbalized understanding;Verbal cues required          SLP Short Term Goals - 09/14/16 1810      SLP SHORT TERM GOAL #1   Title Pt will average 93dB on  loud /a/ over 3 sessions with rare min A   Baseline 09/14/16   Time 2   Period Weeks   Status On-going     SLP SHORT TERM GOAL #2   Title Pt will average 72dB on structured speech tasks with rare min A   Time 2   Period Weeks   Status On-going     SLP SHORT TERM GOAL #3   Title Pt will maintain average low 70s dB over 8 minute simple conversation with rare min A   Time 2   Period Weeks   Status On-going     SLP SHORT TERM GOAL #4   Title pt will maintain speech intelligibility of 95%+ for 8 minutes simple conversation over three sesssions   Time 2   Period Weeks   Status On-going     SLP SHORT TERM GOAL #5   Title pt will demo knowledge of 4 different memory compenstions   Time 2   Period Weeks    Status On-going          SLP Long Term Goals - 09/14/16 1811      SLP LONG TERM GOAL #1   Title Pt will maintain 70dB over 15 minute mod complex conversation with rare min A over three therapy sessions   Time 6   Period Weeks   Status On-going     SLP LONG TERM GOAL #2   Title Pt will maintain 95%+ speech intelligibility in noisy environment outside of tx room over 12 minutes with rare min A, over three sessions   Time 6   Period Weeks   Status On-going     SLP LONG TERM GOAL #3   Title pt will demo one memory compensation in 3 sessions    Time 6   Period Weeks   Status On-going     SLP LONG TERM GOAL #4   Title pt will demo anticipatory awareness to independently double check his work in [employer] complex cognitive linguistic tasks in 7 sessions   Time 6   Period Weeks   Status On-going     SLP LONG TERM GOAL #5   Title in detailed cognitive linguistic tasks pt will be 100% accurate with modified independence (double-checking his work)   Time 6   Period Weeks   Status On-going          Plan - 09/14/16 1810    Clinical Impression Statement Patient responsive to verbal cues for effort level today to increase loudness during structured speech tasks and conversation. He continues to display reduced awareness of speech patterns of rushes of speech, reduced articulatory precision, particularly in lengthier sentences and tasks with increased cognitive load. Skilled ST is necessary for recalibration of speech musculature for production of clear, intelligible speech.    Speech Therapy Frequency 2x / week   Treatment/Interventions Compensatory strategies;Patient/family education;Multimodal communcation approach;Internal/external aids;SLP instruction and feedback;Functional tasks;Cueing hierarchy   Potential to Achieve Goals Fair   Potential Considerations Severity of impairments   Consulted and Agree with Plan of Care Patient      Patient will benefit from skilled therapeutic  intervention in order to improve the following deficits and impairments:   Dysarthria and anarthria  Cognitive communication deficit    Problem List Patient Active Problem List   Diagnosis Date Noted  . Parkinson disease ([place])    Deneise Lever, [place], CCC-SLP Speech-Language Pathologist  Ali[place] Al[place] 09/14/2016, [facility] [address]  [place], [name], [address] Phone: [phone]   Fax:  [phone]   Name: Tatem Fesler MRN: [id number] Date of Birth: 10-Jul-1945

## 2016-09-14 NOTE — Therapy (Signed)
[facility] [address] [place], [name], [address] Phone: [phone]   Fax:  [phone]  Occupational Therapy Treatment  Patient Details  Name: Ronald Noble MRN: [id number] Date of Birth: [birth year] Referring Provider: Dr. Star Age  Encounter Date: 09/14/2016      OT End of Session - 09/14/16 0816    Visit Number 3   Number of Visits 17   Date for OT Re-Evaluation 10/31/16   Authorization Type Medicare/BCBS - G Code needed   Authorization - Visit Number 3   Authorization - Number of Visits 10   OT Start Time 0808   OT Stop Time 0846   OT Time Calculation (min) 38 min   Activity Tolerance Patient tolerated treatment well   Behavior During Therapy [facility] for tasks assessed/performed      Past Medical History:  Diagnosis Date  . BPH (benign prostatic hyperplasia)   . Parkinson disease ([place])   . Skin cancer of face    arms and back  . Sleep apnea    mild  . Spider bite    brown recluse/ left ankle/08/13    Past Surgical History:  Procedure Laterality Date  . BURR HOLE W/ STEREOTACTIC INSERTION OF DBS LEADS / INTRAOP MICROELECTRODE RECORDING    . COLONOSCOPY    . KNEE SURGERY     exploratory/left  . SKIN CANCER EXCISION  01/2014   face  . TRANSURETHRAL RESECTION OF PROSTATE     2008    There were no vitals filed for this visit.      Subjective Assessment - 09/14/16 0815    Subjective  just stiff   Pertinent History PD since 2008, s/p DBS 06/2014, skin cancer, sleep apnea, BPH, TURP, orthostatic hypotension, syncopal episodes; arthritic changes in spine   Limitations DBS, falls, orthostatic hypotension   Patient Stated Goals improve finger dexterity   Currently in Pain? No/denies          Neuro re-ed:  PWR! Moves (up, rock, twist) in supine, quadraped, prone x 10-20 each with min cues For incr movement amplitude.  Pt demo improved posture/shoulder positioning after  performing.  Coordination Activities (with emphasis on PWR! Hand use and large amplitude movements):  Rotating ball in fingertips with each hand and each direction with mod cues and mod-max difficulty.   Placing grooved pegs in pegboard with each hand with min difficulty for incr in-hand manipulation.   Fastening buttons on table with min-mod cues for use of PWR! Hands and large amplitude movement techniques.  Pt demo improved performance (mod difficulty).                        OT Short Term Goals - 09/01/16 1851      OT SHORT TERM GOAL #1   Title Pt will be independent with updated PD specific HEP--STGs due 10/01/16   Time 4   Period Weeks   Status New     OT SHORT TERM GOAL #2   Title Pt will improve L hand coordination for ADLs as shown by improving time on 9-hole peg test by at least 6sec with LUE.   Baseline L-48.06sec   Time 4   Period Weeks   Status New     OT SHORT TERM GOAL #3   Title Pt will improve coordination/functional reaching for ADLs as shown by improving score on box and blocks test by at least 4 blocks   Baseline L-39 blocks  Time 4   Period Weeks   Status New     OT SHORT TERM GOAL #4   Title Pt will be able to fasten/unfasten 3 button in less than 90sec (for improved ability to dress and improved bilateral hand coordination).   Baseline >73min/unable on tabletop   Time 4   Period Weeks   Status New     OT SHORT TERM GOAL #5   Title Pt will verbalize understanding of updated community resources prn.   Time 4   Period Weeks   Status New           OT Long Term Goals - 09/08/16 2116      OT LONG TERM GOAL #1   Title Pt will verbalize understanding of adaptive strategies to incr ease with ADL's/IADLS's--LTGs due 8/92/11   Time 8   Period Weeks   Status New     OT LONG TERM GOAL #2   Title Pt will improve coordination for ADLs as shown by improving time on 9-hole peg test by at least 12sec with LUE.   Baseline 48.06sec    Time 8   Period Weeks   Status New     OT LONG TERM GOAL #3   Title Pt will improve functional reaching/coordination for ADLs as shown by improving score on box and blocks test by at least 8blocks with LUE.     OT LONG TERM GOAL #4   Title Pt will be able to fasten/unfasten 3 button in less than 70sec (for improved ability to dress and improved bilateral hand coordination).   Baseline unable, >83min   Time 8   Period Weeks   Status New     OT LONG TERM GOAL #5   Title Assess standing functional reach and add goal prn   Time 8   Status Deferred  09/08/16:  WNL               Plan - 09/14/16 0816    Clinical Impression Statement Pt responds well to PWR! moves for stretching and improved posture/shoulder positioning.    Rehab Potential Good   Current Impairments/barriers affecting progress: decr awareness, s/p DBS   OT Frequency 2x / week   OT Duration 8 weeks  +eval   OT Treatment/Interventions Self-care/ADL training;Moist Heat;DME and/or AE instruction;Patient/family education;Therapeutic exercises;Therapeutic activities;Neuromuscular education;Functional Mobility Training;Passive range of motion;Manual Therapy;Cognitive remediation/compensation;Visual/perceptual remediation/compensation;Energy conservation   Plan functional reaching with coordination, in-hand manipulation   OT Home Exercise Plan 11/14/15: PWR! Seated; PWR! supine; 11/19/15  PWR! hands; 11/25/15 coordination HEP. 11/28/15: PWR! Quadraped. 12/03/15: additional coordination HEP; PWR! moves in prone and standing, PD ex. chart 12/05/15    Consulted and Agree with Plan of Care Patient      Patient will benefit from skilled therapeutic intervention in order to improve the following deficits and impairments:  Decreased coordination, Difficulty walking, Impaired flexibility, Improper body mechanics, Decreased endurance, Decreased safety awareness, Improper spinal/pelvic alignment, Decreased knowledge of precautions, Decreased  activity tolerance, Impaired tone, Decreased balance, Decreased knowledge of use of DME, Impaired UE functional use, Decreased cognition, Decreased mobility, Impaired vision/preception, Impaired perceived functional ability  Visit Diagnosis: Other symptoms and signs involving the musculoskeletal system  Other symptoms and signs involving the nervous system  Abnormal posture  Other abnormalities of gait and mobility  Other lack of coordination  Unsteadiness on feet  Attention and concentration deficit    Problem List Patient Active Problem List   Diagnosis Date Noted  . Parkinson disease ([place])  [facility] 09/14/2016, 8:22 AM  [facility] [address] [place], [name], [address] Phone: [phone]   Fax:  [phone]  Name: Dajaun Goldring MRN: [id number] Date of Birth: Oct 15, 1945   Vianne Bulls, OTR/L [facility] [address] [place], [place]  [address] [phone] phone [phone] 09/14/16 8:22 AM

## 2016-09-15 ENCOUNTER — Ambulatory Visit: Payer: Medicare Other | Admitting: Occupational Therapy

## 2016-09-15 ENCOUNTER — Ambulatory Visit: Payer: Medicare Other

## 2016-09-15 DIAGNOSIS — R471 Dysarthria and anarthria: Secondary | ICD-10-CM

## 2016-09-15 DIAGNOSIS — R293 Abnormal posture: Secondary | ICD-10-CM

## 2016-09-15 DIAGNOSIS — R278 Other lack of coordination: Secondary | ICD-10-CM | POA: Diagnosis not present

## 2016-09-15 DIAGNOSIS — R29818 Other symptoms and signs involving the nervous system: Secondary | ICD-10-CM | POA: Diagnosis not present

## 2016-09-15 DIAGNOSIS — R29898 Other symptoms and signs involving the musculoskeletal system: Secondary | ICD-10-CM | POA: Diagnosis not present

## 2016-09-15 DIAGNOSIS — R2689 Other abnormalities of gait and mobility: Secondary | ICD-10-CM | POA: Diagnosis not present

## 2016-09-15 DIAGNOSIS — R41840 Attention and concentration deficit: Secondary | ICD-10-CM

## 2016-09-15 DIAGNOSIS — R2681 Unsteadiness on feet: Secondary | ICD-10-CM

## 2016-09-15 DIAGNOSIS — R41841 Cognitive communication deficit: Secondary | ICD-10-CM

## 2016-09-15 NOTE — Therapy (Signed)
[name] [address] [place], [name], [address] Phone: [phone]   Fax:  [phone]  Speech Language Pathology Treatment  Patient Details  Name: Ronald Noble MRN: [id number] Date of Birth: 1945-12-11 Referring Provider: Star Age, MD  Encounter Date: 09/15/2016      End of Session - 09/15/16 1709    Visit Number 6   Number of Visits 17   Date for SLP Re-Evaluation 10/30/16   SLP Start Time 6948   SLP Stop Time  5462   SLP Time Calculation (min) 43 min   Activity Tolerance Patient tolerated treatment well      Past Medical History:  Diagnosis Date  . BPH (benign prostatic hyperplasia)   . Parkinson disease ([place])   . Skin cancer of face    arms and back  . Sleep apnea    mild  . Spider bite    brown recluse/ left ankle/08/13    Past Surgical History:  Procedure Laterality Date  . BURR HOLE W/ STEREOTACTIC INSERTION OF DBS LEADS / INTRAOP MICROELECTRODE RECORDING    . COLONOSCOPY    . KNEE SURGERY     exploratory/left  . SKIN CANCER EXCISION  01/2014   face  . TRANSURETHRAL RESECTION OF PROSTATE     2008    There were no vitals filed for this visit.      Subjective Assessment - 09/15/16 1454    Subjective 'Was it yesterday (that I was here last)?"   Currently in Pain? No/denies               ADULT SLP TREATMENT - 09/15/16 1455      General Information   Behavior/Cognition Alert;Cooperative;Pleasant mood     Treatment Provided   Treatment provided Cognitive-Linquistic     Cognitive-Linquistic Treatment   Treatment focused on Cognition   Skilled Treatment Pt with explanation of last session's ST without any notable slowing of speech rate. SLP cued pt to do so by asking him to repeat a multisyllable word 3 different times but this did not improve pt's awareness of decr'd intelligibility. Pt demonstrated better anticipatory awareness re: impulsivity by more carefully  reading directions in a detailed written directions task, following one previous detailed written directions task in which SLP had to cue pt for checking answers prior to moving on to next direction due to errors.      Assessment / Recommendations / Plan   Plan Continue with current plan of care     Progression Toward Goals   Progression toward goals Not progressing toward goals (comment)  decr'd awareness hinders progress with intelligibilty          SLP Education - 09/15/16 1708    Education provided Yes   Education Details need to reduce speech rate to improve intelligibility, need to slow down when involved with household/daily tasks to improve emergent awareness    Person(s) Educated Patient   Methods Explanation;Demonstration   Comprehension Verbalized understanding;Need further instruction;Verbal cues required          SLP Short Term Goals - 09/15/16 1711      SLP SHORT TERM GOAL #1   Title Pt will average 93dB on loud /a/ over 3 sessions with rare min A   Baseline 09/14/16   Time 2   Period Weeks   Status On-going     SLP SHORT TERM GOAL #2   Title Pt will average 72dB on structured speech tasks with rare min A  Time 2   Period Weeks   Status On-going     SLP SHORT TERM GOAL #3   Title Pt will maintain average low 70s dB over 8 minute simple conversation with rare min A   Time 2   Period Weeks   Status On-going     SLP SHORT TERM GOAL #4   Title pt will maintain speech intelligibility of 95%+ for 8 minutes simple conversation over three sesssions   Time 2   Period Weeks   Status On-going     SLP SHORT TERM GOAL #5   Title pt will demo knowledge of 4 different memory compenstions   Time 2   Period Weeks   Status On-going          SLP Long Term Goals - 09/15/16 1712      SLP LONG TERM GOAL #1   Title Pt will maintain 70dB over 15 minute mod complex conversation with rare min A over three therapy sessions   Time 6   Period Weeks   Status  On-going     SLP LONG TERM GOAL #2   Title Pt will maintain 95%+ speech intelligibility in noisy environment outside of tx room over 12 minutes with rare min A, over three sessions   Time 6   Period Weeks   Status On-going     SLP LONG TERM GOAL #3   Title pt will demo one memory compensation in 3 sessions    Time 6   Period Weeks   Status On-going     SLP LONG TERM GOAL #4   Title pt will demo anticipatory awareness to independently double check his work in [employer] complex cognitive linguistic tasks in 7 sessions   Time 6   Period Weeks   Status On-going     SLP LONG TERM GOAL #5   Title in detailed cognitive linguistic tasks pt will be 100% accurate with modified independence (double-checking his work)   Time 6   Period Weeks   Status On-going          Plan - 09/15/16 1709    Clinical Impression Statement Patient continues to display reduced awareness of speech patterns of rushes of speech, reduced articulatory precision. Pt was somewhat successful at reducing impulsivity with written tasks when concrete cueing to slow down and take his time was provided initially. Skilled ST is necessary for recalibration of speech musculature for production of clear, intelligible speech and to improve pt's cognitive linguistic ability.   Speech Therapy Frequency 2x / week   Duration --  8 weeks   Treatment/Interventions Compensatory strategies;Patient/family education;Multimodal communcation approach;Internal/external aids;SLP instruction and feedback;Functional tasks;Cueing hierarchy;Cognitive reorganization   Potential to Achieve Goals Fair   Potential Considerations Severity of impairments      Patient will benefit from skilled therapeutic intervention in order to improve the following deficits and impairments:   Dysarthria and anarthria  Cognitive communication deficit    Problem List Patient Active Problem List   Diagnosis Date Noted  . Parkinson disease ([place])      [facility] ,[place], [place]  09/15/2016, 5:12 PM  [facility] [address] [place], [name], [address] Phone: [phone]   Fax:  [phone]   Name: Ronald Noble MRN: [id number] Date of Birth: [birth year]

## 2016-09-15 NOTE — Therapy (Signed)
[facility] [address] [place], [name], [address] Phone: [phone]   Fax:  [phone]  Occupational Therapy Treatment  Patient Details  Name: Ronald Noble MRN: [id number] Date of Birth: [birth year] Referring Provider: Dr. Star Age  Encounter Date: 09/15/2016      OT End of Session - 09/15/16 1537    Visit Number 4   Number of Visits 17   Date for OT Re-Evaluation 10/31/16   Authorization Type Medicare/BCBS - G Code needed   Authorization - Visit Number 4   Authorization - Number of Visits 10   OT Start Time 2694   OT Stop Time 1615   OT Time Calculation (min) 41 min   Activity Tolerance Patient tolerated treatment well   Behavior During Therapy [facility] for tasks assessed/performed      Past Medical History:  Diagnosis Date  . BPH (benign prostatic hyperplasia)   . Parkinson disease ([place])   . Skin cancer of face    arms and back  . Sleep apnea    mild  . Spider bite    brown recluse/ left ankle/08/13    Past Surgical History:  Procedure Laterality Date  . BURR HOLE W/ STEREOTACTIC INSERTION OF DBS LEADS / INTRAOP MICROELECTRODE RECORDING    . COLONOSCOPY    . KNEE SURGERY     exploratory/left  . SKIN CANCER EXCISION  01/2014   face  . TRANSURETHRAL RESECTION OF PROSTATE     2008    There were no vitals filed for this visit.      Subjective Assessment - 09/15/16 1533    Subjective  went to first yoga class in approx a month and it went well   Pertinent History PD since 2008, s/p DBS 06/2014, skin cancer, sleep apnea, BPH, TURP, orthostatic hypotension, syncopal episodes; arthritic changes in spine   Limitations DBS, falls, orthostatic hypotension   Patient Stated Goals improve finger dexterity   Currently in Pain? No/denies         Neuro re-ed:  PWR! Moves (up, rock, twist) in supine and prone x 10-20 each with min cues For incr movement amplitude.  Pt demo improved  posture/shoulder positioning after performing.   Functional step and reach forward/back in diagonal to manipulate 3 large pegs in hand to place in vertical pegboard.  Pt with incr difficulty with in hand manipulation and step with LUE/LLE versus RUE.  (emphasis on wt. Shift, large amplitude movements, and trunk rotation).  In standing functional reaching with BUEs with ball in diagonal patterns (floor>overhead) with min cueing for trunk rotation, wt. Shift, and elbow ext.    Emphasized importance of standing with feet apart for incr wt. Shift, decr fatigue, improved balance and stepping out (avoiding crossing over) for turning strategy as well as keeping feet apart/staggered to decr freezing (throughout session).  Pt verbalized understanding.                    OT Short Term Goals - 09/01/16 1851      OT SHORT TERM GOAL #1   Title Pt will be independent with updated PD specific HEP--STGs due 10/01/16   Time 4   Period Weeks   Status New     OT SHORT TERM GOAL #2   Title Pt will improve L hand coordination for ADLs as shown by improving time on 9-hole peg test by at least 6sec with LUE.   Baseline L-48.06sec   Time 4   Period  Weeks   Status New     OT SHORT TERM GOAL #3   Title Pt will improve coordination/functional reaching for ADLs as shown by improving score on box and blocks test by at least 4 blocks   Baseline L-39 blocks   Time 4   Period Weeks   Status New     OT SHORT TERM GOAL #4   Title Pt will be able to fasten/unfasten 3 button in less than 90sec (for improved ability to dress and improved bilateral hand coordination).   Baseline >99min/unable on tabletop   Time 4   Period Weeks   Status New     OT SHORT TERM GOAL #5   Title Pt will verbalize understanding of updated community resources prn.   Time 4   Period Weeks   Status New           OT Long Term Goals - 09/08/16 2116      OT LONG TERM GOAL #1   Title Pt will verbalize understanding of  adaptive strategies to incr ease with ADL's/IADLS's--LTGs due 9/67/89   Time 8   Period Weeks   Status New     OT LONG TERM GOAL #2   Title Pt will improve coordination for ADLs as shown by improving time on 9-hole peg test by at least 12sec with LUE.   Baseline 48.06sec   Time 8   Period Weeks   Status New     OT LONG TERM GOAL #3   Title Pt will improve functional reaching/coordination for ADLs as shown by improving score on box and blocks test by at least 8blocks with LUE.     OT LONG TERM GOAL #4   Title Pt will be able to fasten/unfasten 3 button in less than 70sec (for improved ability to dress and improved bilateral hand coordination).   Baseline unable, >49min   Time 8   Period Weeks   Status New     OT LONG TERM GOAL #5   Title Assess standing functional reach and add goal prn   Time 8   Status Deferred  09/08/16:  WNL               Plan - 09/15/16 1537    Clinical Impression Statement Pt with incr difficulty with functional step and in-hand manipulation with L side today.  Responds well to cueing for incr movement amplitude.   Rehab Potential Good   Current Impairments/barriers affecting progress: decr awareness, s/p DBS   OT Frequency 2x / week   OT Duration 8 weeks  +eval   OT Treatment/Interventions Self-care/ADL training;Moist Heat;DME and/or AE instruction;Patient/family education;Therapeutic exercises;Therapeutic activities;Neuromuscular education;Functional Mobility Training;Passive range of motion;Manual Therapy;Cognitive remediation/compensation;Visual/perceptual remediation/compensation;Energy conservation   Plan functional reaching with coordination, in-hand manipulation   OT Home Exercise Plan Education provided:  Reviewed PWR! hands and coordination HEP, reviewed PWR! moves (up, rock, twist) in supine, quadraped, and prone   Consulted and Agree with Plan of Care Patient      Patient will benefit from skilled therapeutic intervention in order to  improve the following deficits and impairments:  Decreased coordination, Difficulty walking, Impaired flexibility, Improper body mechanics, Decreased endurance, Decreased safety awareness, Improper spinal/pelvic alignment, Decreased knowledge of precautions, Decreased activity tolerance, Impaired tone, Decreased balance, Decreased knowledge of use of DME, Impaired UE functional use, Decreased cognition, Decreased mobility, Impaired vision/preception, Impaired perceived functional ability  Visit Diagnosis: Other symptoms and signs involving the nervous system  Other symptoms and signs involving the musculoskeletal  system  Abnormal posture  Other abnormalities of gait and mobility  Other lack of coordination  Unsteadiness on feet  Attention and concentration deficit    Problem List Patient Active Problem List   Diagnosis Date Noted  . Parkinson disease [facility])     [facility] 09/15/2016, 4:27 PM  [facility] [address] [place], [name], [address] Phone: [phone]   Fax:  [phone]  Name: Ronald Noble MRN: [id number] Date of Birth: 01-25-1946   Vianne Bulls, OTR/L [facility] [address] [place], [place]  [address] [phone] phone [phone] 09/15/16 4:27 PM

## 2016-09-18 ENCOUNTER — Ambulatory Visit: Payer: Medicare Other | Admitting: Physical Therapy

## 2016-09-18 ENCOUNTER — Encounter: Payer: Self-pay | Admitting: Physical Therapy

## 2016-09-18 DIAGNOSIS — R29898 Other symptoms and signs involving the musculoskeletal system: Secondary | ICD-10-CM | POA: Diagnosis not present

## 2016-09-18 DIAGNOSIS — R278 Other lack of coordination: Secondary | ICD-10-CM | POA: Diagnosis not present

## 2016-09-18 DIAGNOSIS — R2689 Other abnormalities of gait and mobility: Secondary | ICD-10-CM | POA: Diagnosis not present

## 2016-09-18 DIAGNOSIS — R29818 Other symptoms and signs involving the nervous system: Secondary | ICD-10-CM

## 2016-09-18 DIAGNOSIS — R293 Abnormal posture: Secondary | ICD-10-CM | POA: Diagnosis not present

## 2016-09-18 DIAGNOSIS — R2681 Unsteadiness on feet: Secondary | ICD-10-CM | POA: Diagnosis not present

## 2016-09-18 NOTE — Therapy (Signed)
[facility] [address] [place], [name], [address] Phone: [phone]   Fax:  [phone]  Physical Therapy Treatment  Patient Details  Name: Ronald Noble MRN: [id number] Date of Birth: Nov 30, 1945 Referring Provider: Rexene Alberts  Encounter Date: 09/18/2016      PT End of Session - 09/18/16 2311    Visit Number 4   Number of Visits 17   Date for PT Re-Evaluation 10/26/16   Authorization Type Medicare Primary, BCBS 2nd-GCODE every 10th visit   PT Start Time 1536   PT Stop Time 1617   PT Time Calculation (min) 41 min   Behavior During Therapy [facility] for tasks assessed/performed      Past Medical History:  Diagnosis Date  . BPH (benign prostatic hyperplasia)   . Parkinson disease ([place])   . Skin cancer of face    arms and back  . Sleep apnea    mild  . Spider bite    brown recluse/ left ankle/08/13    Past Surgical History:  Procedure Laterality Date  . BURR HOLE W/ STEREOTACTIC INSERTION OF DBS LEADS / INTRAOP MICROELECTRODE RECORDING    . COLONOSCOPY    . KNEE SURGERY     exploratory/left  . SKIN CANCER EXCISION  01/2014   face  . TRANSURETHRAL RESECTION OF PROSTATE     2008    There were no vitals filed for this visit.      Subjective Assessment - 09/18/16 1540    Subjective Pt reports some incr back pain since doing his exercises.    Pertinent History s/p DBS, skin cancer, sleep apnea, BPH, TURP, orthostatic hypotension, syncopal episodes; arthritic changes in spine   Patient Stated Goals Pt's goals for therapy are to improve strength, posture, and agility with movement.   Currently in Pain? Yes   Pain Score --  1/2   Pain Location Back   Pain Orientation Lower;Right   Pain Descriptors / Indicators Sore   Pain Type Acute pain   Pain Onset 1 to 4 weeks ago                            PWR [facility]) - 09/18/16 1553    PWR! exercises Moves in sitting;Moves in standing  in  stting x 20 reps x 4 exercises   PWR! Up x 10   PWR! Rock 10   PWR! Twist 10   PWR Step 10   Comments and fwd/back stepping strategy          Balance Exercises - 09/18/16 2306      Balance Exercises: Standing   Standing Eyes Opened Narrow base of support (BOS);Head turns;Foam/compliant surface   Standing Eyes Closed Narrow base of support (BOS);Foam/compliant surface   Standing, One Foot on a Step Eyes open;Foam/compliant surface;6 inch   Rockerboard Anterior/posterior;Lateral;Head turns;EO;EC;Intermittent UE support  ant/post step off/on bil   Balance Beam black and blue beams           PT Education - 09/18/16 2310    Education provided Yes   Education Details need to divide PWR exercises into groupings and complete one group one day and then the other the next day   Person(s) Educated Patient   Methods Explanation;Demonstration;Verbal cues;Handout   Comprehension Verbalized understanding;Returned demonstration;Verbal cues required;Need further instruction          PT Short Term Goals - 08/27/16 1323      PT SHORT TERM GOAL #1  Title Pt will be independent with HEP for improved balance, posture, functional mobility.  TARGET 09/26/16   Time 4   Period Weeks   Status New     PT SHORT TERM GOAL #2   Title Pt will perform at least 8 of 10 reps of sit<>stand transfers from <18 surfaces, independently, no posterior lean, for improved transfer efficiency and safety.   Time 4   Period Weeks   Status New     PT SHORT TERM GOAL #3   Title Pt will verbalize at least 3 means to reduce pain in low back with functional activities.   Time 4   Period Weeks   Status New     PT SHORT TERM GOAL #4   Title Pt will improve posterior push and release test to less than 2 steps to independently recover balance in posterior direction.   Time 4   Period Weeks   Status New           PT Long Term Goals - 08/27/16 1325      PT LONG TERM GOAL #1   Title Pt will verbalize  understanding of fall prevention in home environment.  TARGET 10/26/16   Time 8   Period Weeks   Status New     PT LONG TERM GOAL #2   Title Pt will improve MiniBESTest score to at least 22/28 for decreased fall risk.   Time 8   Period Weeks   Status New     PT LONG TERM GOAL #3   Title Pt will verbalize understanding of techniques to reduce festination/freezing of gait.   Time 8   Period Weeks   Status New     PT LONG TERM GOAL #4   Title Pt will verbalize plans for continued community fitness upon D/C from PT.   Time 8   Period Weeks   Status New     PT LONG TERM GOAL #5   Title Pt will ambulate at least 1000 ft, indoor and outdoor surfaces, independently, no LOB, for improved community gait.   Time 8   Period Weeks   Status New               Plan - 09/18/16 2313    Clinical Impression Statement Session focused on reviewing technique for PWR! sitting and standing exercises. Patient required min cues when utlizing picures to instruct himself. Also focused on standing balance and strengthening ankles to improve stability. Patient continues to make good progress and will conitnue to benefit from PT.    Rehab Potential Good   PT Frequency 2x / week   PT Duration 8 weeks  plus eval   PT Treatment/Interventions ADLs/Self Care Home Management;Functional mobility training;Gait training;Patient/family education;Therapeutic activities;Therapeutic exercise;Balance training;Neuromuscular re-education   PT Next Visit Plan continue ankle stability exercises; balance training; ?stretches for spine due to increased rt lateral shift/scoliosis   Consulted and Agree with Plan of Care Patient      Patient will benefit from skilled therapeutic intervention in order to improve the following deficits and impairments:  Abnormal gait, Decreased balance, Decreased mobility, Decreased strength, Difficulty walking, Postural dysfunction, Pain  Visit Diagnosis: Other symptoms and signs  involving the nervous system  Other symptoms and signs involving the musculoskeletal system  Unsteadiness on feet  Other abnormalities of gait and mobility     Problem List Patient Active Problem List   Diagnosis Date Noted  . Parkinson disease ([place])     Rexanne Mano, PT  09/18/2016, 11:25 PM  [facility] [address] [place], [name], [address] Phone: [phone]   Fax:  [phone]  Name: Ronald Noble MRN: [id number] Date of Birth: [birth year]

## 2016-09-21 ENCOUNTER — Ambulatory Visit: Payer: Medicare Other | Admitting: Physical Therapy

## 2016-09-21 ENCOUNTER — Ambulatory Visit: Payer: Medicare Other | Admitting: Speech Pathology

## 2016-09-21 ENCOUNTER — Encounter: Payer: Self-pay | Admitting: Physical Therapy

## 2016-09-21 DIAGNOSIS — R29898 Other symptoms and signs involving the musculoskeletal system: Secondary | ICD-10-CM

## 2016-09-21 DIAGNOSIS — R2681 Unsteadiness on feet: Secondary | ICD-10-CM

## 2016-09-21 DIAGNOSIS — R293 Abnormal posture: Secondary | ICD-10-CM

## 2016-09-21 DIAGNOSIS — R2689 Other abnormalities of gait and mobility: Secondary | ICD-10-CM | POA: Diagnosis not present

## 2016-09-21 DIAGNOSIS — R29818 Other symptoms and signs involving the nervous system: Secondary | ICD-10-CM

## 2016-09-21 DIAGNOSIS — R41841 Cognitive communication deficit: Secondary | ICD-10-CM

## 2016-09-21 DIAGNOSIS — R278 Other lack of coordination: Secondary | ICD-10-CM | POA: Diagnosis not present

## 2016-09-21 DIAGNOSIS — R471 Dysarthria and anarthria: Secondary | ICD-10-CM

## 2016-09-21 NOTE — Therapy (Signed)
[facility] [address] [place], [name], [address] Phone: [phone]   Fax:  [phone]  Physical Therapy Treatment  Patient Details  Name: Ronald Noble MRN: [id number] Date of Birth: 1945-07-11 Referring Provider: Rexene Alberts  Encounter Date: 09/21/2016      PT End of Session - 09/21/16 2014    Visit Number 5   Number of Visits 17   Date for PT Re-Evaluation 10/26/16   Authorization Type Medicare Primary, BCBS 2nd-GCODE every 10th visit   PT Start Time 1532   PT Stop Time 1617   PT Time Calculation (min) 45 min   Activity Tolerance Patient tolerated treatment well   Behavior During Therapy [facility] for tasks assessed/performed      Past Medical History:  Diagnosis Date  . BPH (benign prostatic hyperplasia)   . Parkinson disease ([place])   . Skin cancer of face    arms and back  . Sleep apnea    mild  . Spider bite    brown recluse/ left ankle/08/13    Past Surgical History:  Procedure Laterality Date  . BURR HOLE W/ STEREOTACTIC INSERTION OF DBS LEADS / INTRAOP MICROELECTRODE RECORDING    . COLONOSCOPY    . KNEE SURGERY     exploratory/left  . SKIN CANCER EXCISION  01/2014   face  . TRANSURETHRAL RESECTION OF PROSTATE     2008    There were no vitals filed for this visit.      Subjective Assessment - 09/21/16 1535    Subjective Back pain comes and goes. Not sure related to exercises.    Pertinent History s/p DBS, skin cancer, sleep apnea, BPH, TURP, orthostatic hypotension, syncopal episodes; arthritic changes in spine   Patient Stated Goals Pt's goals for therapy are to improve strength, posture, and agility with movement.   Currently in Pain? No/denies   Pain Score 0-No pain   Pain Onset 1 to 4 weeks ago                         [facility] Adult PT Treatment/Exercise - 09/21/16 2005      Bed Mobility   Bed Mobility Sit to Supine;Supine to Sit   Supine to Sit 7: Independent    Sit to Supine 7: Independent     Transfers   Transfers Sit to Stand;Stand to Sit   Sit to Stand 7: Independent   Stand to Sit 7: Independent     Posture/Postural Control   Posture/Postural Control Postural limitations   Postural Limitations Weight shift right;Anterior pelvic tilt  above pelvis, trunk shifted   Posture Comments Pt with noted rt lateral shift of torso over pelvis in both sitting and standing; pt denied h/o scoliosis with pt then checked with forward bending test and no scoliosis observed. Pt reports in his car and his favorite chair at home, he leans to his right on the armrest. With assist, pt able to shift torso back to midline and hold ~15 seconds and then becomes distracted. Worked on alignment in front of a Geologist, engineering. Educated in stretches to move torso back to midline and pt able to perform with noted improved posture and balance after stretches (standing, seated, left sidelying)      Exercises   Exercises Ankle     Ankle Exercises: Standing   BAPS Standing;10 reps   BAPS Limitations single leg with other leg held up by pt (refused to place on step in front of  BAPs board)    Rocker Board 5 minutes;Limitations   Rocker Board Limitations initially could not achieve midline when working right to left; after stretching improved; also anterior/posterior with progression to step off/on anterior and posterior   Other Standing Ankle Exercises BOSU in// bars facing full length mirror for weight shifting to midline from rt lateral shift             Balance Exercises - 09/21/16 2011      Balance Exercises: Standing   Rockerboard Anterior/posterior;Lateral;EO;EC;Intermittent UE support  improved balance after stretches and midline training   Other Standing Exercises step off/on rockerboard with single leg forward and posterior           PT Education - 09/21/16 2013    Education provided Yes   Education Details see HEP   Person(s) Educated Patient   Methods  Explanation;Demonstration;Tactile cues;Verbal cues;Handout   Comprehension Verbalized understanding;Returned demonstration;Verbal cues required;Tactile cues required;Need further instruction          PT Short Term Goals - 08/27/16 1323      PT SHORT TERM GOAL #1   Title Pt will be independent with HEP for improved balance, posture, functional mobility.  TARGET 09/26/16   Time 4   Period Weeks   Status New     PT SHORT TERM GOAL #2   Title Pt will perform at least 8 of 10 reps of sit<>stand transfers from <18 surfaces, independently, no posterior lean, for improved transfer efficiency and safety.   Time 4   Period Weeks   Status New     PT SHORT TERM GOAL #3   Title Pt will verbalize at least 3 means to reduce pain in low back with functional activities.   Time 4   Period Weeks   Status New     PT SHORT TERM GOAL #4   Title Pt will improve posterior push and release test to less than 2 steps to independently recover balance in posterior direction.   Time 4   Period Weeks   Status New           PT Long Term Goals - 08/27/16 1325      PT LONG TERM GOAL #1   Title Pt will verbalize understanding of fall prevention in home environment.  TARGET 10/26/16   Time 8   Period Weeks   Status New     PT LONG TERM GOAL #2   Title Pt will improve MiniBESTest score to at least 22/28 for decreased fall risk.   Time 8   Period Weeks   Status New     PT LONG TERM GOAL #3   Title Pt will verbalize understanding of techniques to reduce festination/freezing of gait.   Time 8   Period Weeks   Status New     PT LONG TERM GOAL #4   Title Pt will verbalize plans for continued community fitness upon D/C from PT.   Time 8   Period Weeks   Status New     PT LONG TERM GOAL #5   Title Pt will ambulate at least 1000 ft, indoor and outdoor surfaces, independently, no LOB, for improved community gait.   Time 8   Period Weeks   Status New               Plan - 09/21/16 2016     Clinical Impression Statement Patient noted to have decreased balance due to excessive shift of torso to right (especially prominent on rockerboard).  See notes for full assessment. Session focused on educating patient to change his preferred sitting posture ( rt leaning) and stretches to assist with shifting torso to midline. Patient continues to benefit from PT.    Rehab Potential Good   PT Frequency 2x / week   PT Duration 8 weeks  plus eval   PT Treatment/Interventions ADLs/Self Care Home Management;Functional mobility training;Gait training;Patient/family education;Therapeutic activities;Therapeutic exercise;Balance training;Neuromuscular re-education   PT Next Visit Plan check stretches for spine due to increased rt lateral shift given 6/18; continue ankle stability exercises; balance training;    Consulted and Agree with Plan of Care Patient      Patient will benefit from skilled therapeutic intervention in order to improve the following deficits and impairments:  Abnormal gait, Decreased balance, Decreased mobility, Decreased strength, Difficulty walking, Postural dysfunction, Pain  Visit Diagnosis: Other symptoms and signs involving the nervous system  Other symptoms and signs involving the musculoskeletal system  Unsteadiness on feet  Abnormal posture     Problem List Patient Active Problem List   Diagnosis Date Noted  . Parkinson disease ([place])     Rexanne Mano, PT 09/21/2016, 8:25 PM  [facility] [address] [place], [name], [address] Phone: [phone]   Fax:  [phone]  Name: Ronald Noble MRN: [id number] Date of Birth: [birth year]

## 2016-09-21 NOTE — Patient Instructions (Signed)
[place]    Stand with left hip against kitchen counter, reach right arm above head toward the upper cabinets. Hold stretch for __30_ seconds.  Repeat __3_ times. Do _2-3__ times per day.  Copyright  VHI. All rights reserved.

## 2016-09-21 NOTE — Therapy (Signed)
[facility] [address] [place], [name], [address] Phone: [phone]   Fax:  [phone]  Speech Language Pathology Treatment  Patient Details  Name: Mattheu Brodersen MRN: [id number] Date of Birth: [birth year] Referring Provider: Star Age, MD  Encounter Date: 09/21/2016      End of Session - 09/21/16 1823    Visit Number 7   Number of Visits 17   Date for SLP Re-Evaluation 10/30/16   SLP Start Time 1416   SLP Stop Time  1500   SLP Time Calculation (min) 44 min   Activity Tolerance Patient tolerated treatment well      Past Medical History:  Diagnosis Date  . BPH (benign prostatic hyperplasia)   . Parkinson disease ([place])   . Skin cancer of face    arms and back  . Sleep apnea    mild  . Spider bite    brown recluse/ left ankle/08/13    Past Surgical History:  Procedure Laterality Date  . BURR HOLE W/ STEREOTACTIC INSERTION OF DBS LEADS / INTRAOP MICROELECTRODE RECORDING    . COLONOSCOPY    . KNEE SURGERY     exploratory/left  . SKIN CANCER EXCISION  01/2014   face  . TRANSURETHRAL RESECTION OF PROSTATE     2008    There were no vitals filed for this visit.      Subjective Assessment - 09/21/16 1814    Subjective "It kind of all runs together in terms of what was done last time... some type of word exercise"   Currently in Pain? No/denies               ADULT SLP TREATMENT - 09/21/16 1815      General Information   Behavior/Cognition Alert;Cooperative;Pleasant mood   Patient Positioning Upright in chair   Oral care provided N/A     Treatment Provided   Treatment provided Cognitive-Linquistic     Pain Assessment   Pain Assessment No/denies pain     Cognitive-Linquistic Treatment   Treatment focused on Cognition   Skilled Treatment Pt with initial conversation marked by consistent short rushes of speech, speaking on residual air, moderately decreased amplitude (68-69dB  at 30 cm). Recalibrated pt's loudness and breath support in speech tasks/conversation with loud /a/, avg 90.3 dB min cues. Phrase level task with increased cognitive load 72.2 dB, consistent mod cues for rate. Short sentence responses with 72.1 dB avg. 15 minutes conversation avg 71.2 dB with consistent visual cues for rate and loudness.      Assessment / Recommendations / Plan   Plan Continue with current plan of care     Progression Toward Goals   Progression toward goals Progressing toward goals          SLP Education - 09/21/16 1822    Education provided Yes   Education Details slow rate, avoid speaking on residual volume   Person(s) Educated Patient   Methods Explanation;Demonstration;Verbal cues   Comprehension Verbalized understanding;Verbal cues required;Returned demonstration;Need further instruction          SLP Short Term Goals - 09/21/16 1824      SLP SHORT TERM GOAL #1   Title Pt will average 93dB on loud /a/ over 3 sessions with rare min A   Baseline 09/14/16   Time 1   Status On-going     SLP SHORT TERM GOAL #2   Title Pt will average 72dB on structured speech tasks with rare min A   Time  1   Period Weeks   Status On-going     SLP SHORT TERM GOAL #3   Title Pt will maintain average low 70s dB over 8 minute simple conversation with rare min A   Time 1   Period Weeks   Status On-going     SLP SHORT TERM GOAL #4   Title pt will maintain speech intelligibility of 95%+ for 8 minutes simple conversation over three sesssions   Time 1   Period Weeks   Status On-going     SLP SHORT TERM GOAL #5   Title pt will demo knowledge of 4 different memory compenstions   Time 1   Period Weeks   Status On-going          SLP Long Term Goals - 09/21/16 1825      SLP LONG TERM GOAL #1   Title Pt will maintain 70dB over 15 minute mod complex conversation with rare min A over three therapy sessions   Baseline 09/21/16   Time 5   Period Weeks   Status On-going      SLP LONG TERM GOAL #2   Title Pt will maintain 95%+ speech intelligibility in noisy environment outside of tx room over 12 minutes with rare min A, over three sessions   Time 5   Period Weeks   Status On-going     SLP LONG TERM GOAL #3   Title pt will demo one memory compensation in 3 sessions    Time 5   Period Weeks   Status On-going     SLP LONG TERM GOAL #4   Title pt will demo anticipatory awareness to independently double check his work in [employer] complex cognitive linguistic tasks in 7 sessions   Time 5   Period Weeks   Status On-going     SLP LONG TERM GOAL #5   Title in detailed cognitive linguistic tasks pt will be 100% accurate with modified independence (double-checking his work)   Time 5   Period Weeks   Status On-going          Plan - 09/21/16 1823    Clinical Impression Statement Patient continues to display reduced awareness of speech patterns of rushes of speech, reduced articulatory precision. Pt was responsive to visual cues agreed on prior to conversation to reduce rate and increase volume. Skilled ST is necessary for recalibration of speech musculature for production of clear, intelligible speech and to improve pt's cognitive linguistic ability.   Speech Therapy Frequency 2x / week   Treatment/Interventions Compensatory strategies;Patient/family education;Multimodal communcation approach;Internal/external aids;SLP instruction and feedback;Functional tasks;Cueing hierarchy;Cognitive reorganization   Potential to Achieve Goals Fair   Potential Considerations Severity of impairments   Consulted and Agree with Plan of Care Patient      Patient will benefit from skilled therapeutic intervention in order to improve the following deficits and impairments:   Dysarthria and anarthria  Cognitive communication deficit    Problem List Patient Active Problem List   Diagnosis Date Noted  . Parkinson disease [facility])    Deneise Lever, [place],  CCC-SLP Speech-Language Pathologist  Aliene Altes 09/21/2016, 6:26 PM  [facility] [address] [place], [name], [address] Phone: [phone]   Fax:  [phone]   Name: Keigen Caddell MRN: [id number] Date of Birth: 17-Jan-1946

## 2016-09-24 ENCOUNTER — Ambulatory Visit: Payer: Medicare Other | Admitting: Physical Therapy

## 2016-09-24 DIAGNOSIS — R2681 Unsteadiness on feet: Secondary | ICD-10-CM | POA: Diagnosis not present

## 2016-09-24 DIAGNOSIS — R293 Abnormal posture: Secondary | ICD-10-CM

## 2016-09-24 DIAGNOSIS — R29818 Other symptoms and signs involving the nervous system: Secondary | ICD-10-CM | POA: Diagnosis not present

## 2016-09-24 DIAGNOSIS — R278 Other lack of coordination: Secondary | ICD-10-CM | POA: Diagnosis not present

## 2016-09-24 DIAGNOSIS — R29898 Other symptoms and signs involving the musculoskeletal system: Secondary | ICD-10-CM | POA: Diagnosis not present

## 2016-09-24 DIAGNOSIS — R2689 Other abnormalities of gait and mobility: Secondary | ICD-10-CM | POA: Diagnosis not present

## 2016-09-24 NOTE — Therapy (Signed)
[facility] [address] [place], [name], [address] Phone: [phone]   Fax:  [phone]  Physical Therapy Treatment  Patient Details  Name: Aviv Lengacher MRN: [id number] Date of Birth: 04-24-1945 Referring Provider: Rexene Alberts  Encounter Date: 09/24/2016      PT End of Session - 09/24/16 1931    Visit Number 6   Number of Visits 17   Date for PT Re-Evaluation 10/26/16   Authorization Type Medicare Primary, BCBS 2nd-GCODE every 10th visit   PT Start Time 0805   PT Stop Time 0843   PT Time Calculation (min) 38 min   Activity Tolerance Patient tolerated treatment well   Behavior During Therapy [facility] for tasks assessed/performed      Past Medical History:  Diagnosis Date  . BPH (benign prostatic hyperplasia)   . Parkinson disease ([place])   . Skin cancer of face    arms and back  . Sleep apnea    mild  . Spider bite    brown recluse/ left ankle/08/13    Past Surgical History:  Procedure Laterality Date  . BURR HOLE W/ STEREOTACTIC INSERTION OF DBS LEADS / INTRAOP MICROELECTRODE RECORDING    . COLONOSCOPY    . KNEE SURGERY     exploratory/left  . SKIN CANCER EXCISION  01/2014   face  . TRANSURETHRAL RESECTION OF PROSTATE     2008    There were no vitals filed for this visit.      Subjective Assessment - 09/24/16 0808    Subjective Doing well, no changes.  Have been doing the trunk stretches.  Always have to concentrate on sitting up straight.   Pertinent History s/p DBS, skin cancer, sleep apnea, BPH, TURP, orthostatic hypotension, syncopal episodes; arthritic changes in spine   Patient Stated Goals Pt's goals for therapy are to improve strength, posture, and agility with movement.   Currently in Pain? No/denies   Pain Onset 1 to 4 weeks ago                    Functional lower extremity strengthening with varied height transfers:     [facility] Adult PT Treatment/Exercise - 09/24/16  0811      Transfers   Transfers Sit to Stand;Stand to Sit   Sit to Stand 7: Independent   Stand to Sit 7: Independent   Number of Reps 10 reps;Other sets (comment)  4 sets, from 24", 18", 27", then 14" soft surfaces   Transfer Cueing Pt requires initial cues for foot placement and for initial forward lean with lowest surface.   Comments IT trainer for visual cues for upright posture upon standing     Posture/Postural Control   Posture Comments Reviewed HEP given last visit for lateral trunk stretch, which patient performs.  Progressed to adding lateral weigthshifting through pelvis with added reach for improved weightshift and dynamic stretch, x 15 reps     High Level Balance   High Level Balance Comments Posterior push and release test, performed 3 reps, with pt taking no greater than 2 steps to recover balance.           PWR [facility]) - 09/24/16 0845    PWR! exercises Moves in standing   PWR! Up x 10, then x 10 reps with cross-body reach; cues for widened BOS and lateral weightshifting through hips for improved trunk flexibility   PWR! Rock x 10    PWR! Twist x 10   PWR Step x 10  Comments Standing PWR! Moves performed to address posture, weightshift, trunk flexibility and initial stepping/balance strategy work.  Spent extra time with standing lateral weightshifting and educated patient on how to use pelvis and lower trunk to assist with posture and trunk stretching.            Balance Exercises - 09/24/16 1923      Balance Exercises: Standing   Retro Gait 5 reps  forward/back gait with smooth transitions, foot clearance   Other Standing Exercises Posterior step and weightshifting x 20 reps, with multiple verbal cues for proper technique and reason behind step strategy exercise (pt keeps wanting to incorrectly and unsafely weightshift posteriorly onto back foot and then lift toes of back foot, creating instability)             PT Short Term Goals - 09/24/16 9622       PT SHORT TERM GOAL #1   Title Pt will be independent with HEP for improved balance, posture, functional mobility.  TARGET 09/26/16   Baseline Needs minimal cues for technique   Time 4   Period Weeks   Status Partially Met     PT SHORT TERM GOAL #2   Title Pt will perform at least 8 of 10 reps of sit<>stand transfers from <18 surfaces, independently, no posterior lean, for improved transfer efficiency and safety.   Time 4   Period Weeks   Status Achieved     PT SHORT TERM GOAL #3   Title Pt will verbalize at least 3 means to reduce pain in low back with functional activities.   Time 4   Period Weeks   Status Achieved     PT SHORT TERM GOAL #4   Title Pt will improve posterior push and release test to less than 2 steps to independently recover balance in posterior direction.   Time 4   Period Weeks   Status Achieved           PT Long Term Goals - 08/27/16 1325      PT LONG TERM GOAL #1   Title Pt will verbalize understanding of fall prevention in home environment.  TARGET 10/26/16   Time 8   Period Weeks   Status New     PT LONG TERM GOAL #2   Title Pt will improve MiniBESTest score to at least 22/28 for decreased fall risk.   Time 8   Period Weeks   Status New     PT LONG TERM GOAL #3   Title Pt will verbalize understanding of techniques to reduce festination/freezing of gait.   Time 8   Period Weeks   Status New     PT LONG TERM GOAL #4   Title Pt will verbalize plans for continued community fitness upon D/C from PT.   Time 8   Period Weeks   Status New     PT LONG TERM GOAL #5   Title Pt will ambulate at least 1000 ft, indoor and outdoor surfaces, independently, no LOB, for improved community gait.   Time 8   Period Weeks   Status New               Plan - 09/24/16 1932    Clinical Impression Statement Continued to address weightshifting through trunk and through pelvis more additional dynamic stretches to aid posture.  Pt continues to need  cues for step strategy activities for deliberate movement patterns, but pt demo improved ability to recover balance in posterior push and  release test.  Pt will continue to benefit from further skilled PT to work towards [place].   Rehab Potential Good   PT Frequency 2x / week   PT Duration 8 weeks  plus eval   PT Treatment/Interventions ADLs/Self Care Home Management;Functional mobility training;Gait training;Patient/family education;Therapeutic activities;Therapeutic exercise;Balance training;Neuromuscular re-education   PT Next Visit Plan Continue ankle stability exercises and balance training compliant surfaces   Consulted and Agree with Plan of Care Patient      Patient will benefit from skilled therapeutic intervention in order to improve the following deficits and impairments:  Abnormal gait, Decreased balance, Decreased mobility, Decreased strength, Difficulty walking, Postural dysfunction, Pain  Visit Diagnosis: Abnormal posture  Unsteadiness on feet     Problem List Patient Active Problem List   Diagnosis Date Noted  . Parkinson disease ([place])     [name],[name] 09/24/2016, 7:37 PM Frazier Butt., PT  [facility] [address] [place], [name], [address] Phone: [phone]   Fax:  [phone]  Name: Barrett Goldie MRN: [id number] Date of Birth: [birth year]

## 2016-09-28 ENCOUNTER — Ambulatory Visit: Payer: Medicare Other | Admitting: Occupational Therapy

## 2016-09-28 ENCOUNTER — Encounter: Payer: Self-pay | Admitting: Physical Therapy

## 2016-09-28 ENCOUNTER — Ambulatory Visit: Payer: Medicare Other | Admitting: Physical Therapy

## 2016-09-28 DIAGNOSIS — R29898 Other symptoms and signs involving the musculoskeletal system: Secondary | ICD-10-CM | POA: Diagnosis not present

## 2016-09-28 DIAGNOSIS — R29818 Other symptoms and signs involving the nervous system: Secondary | ICD-10-CM

## 2016-09-28 DIAGNOSIS — R2689 Other abnormalities of gait and mobility: Secondary | ICD-10-CM

## 2016-09-28 DIAGNOSIS — R293 Abnormal posture: Secondary | ICD-10-CM | POA: Diagnosis not present

## 2016-09-28 DIAGNOSIS — R278 Other lack of coordination: Secondary | ICD-10-CM | POA: Diagnosis not present

## 2016-09-28 DIAGNOSIS — R41840 Attention and concentration deficit: Secondary | ICD-10-CM

## 2016-09-28 DIAGNOSIS — R2681 Unsteadiness on feet: Secondary | ICD-10-CM

## 2016-09-28 NOTE — Therapy (Signed)
[facility] [address] [place], [name], [address] Phone: [phone]   Fax:  [phone]  Occupational Therapy Treatment  Patient Details  Name: Ronald Noble MRN: [id number] Date of Birth: 1945/08/04 Referring Provider: Dr. Star Age  Encounter Date: 09/28/2016      OT End of Session - 09/28/16 1545    Visit Number 5   Number of Visits 17   Date for OT Re-Evaluation 10/31/16   Authorization Type Medicare/BCBS - G Code needed   Authorization - Visit Number 5   Authorization - Number of Visits 10   OT Start Time 2637   OT Stop Time 1618   OT Time Calculation (min) 38 min   Activity Tolerance Patient tolerated treatment well   Behavior During Therapy [facility] for tasks assessed/performed      Past Medical History:  Diagnosis Date  . BPH (benign prostatic hyperplasia)   . Parkinson disease ([place])   . Skin cancer of face    arms and back  . Sleep apnea    mild  . Spider bite    brown recluse/ left ankle/08/13    Past Surgical History:  Procedure Laterality Date  . BURR HOLE W/ STEREOTACTIC INSERTION OF DBS LEADS / INTRAOP MICROELECTRODE RECORDING    . COLONOSCOPY    . KNEE SURGERY     exploratory/left  . SKIN CANCER EXCISION  01/2014   face  . TRANSURETHRAL RESECTION OF PROSTATE     2008    There were no vitals filed for this visit.      Subjective Assessment - 09/28/16 1543    Subjective  Pt reports nothing new   Pertinent History PD since 2008, s/p DBS 06/2014, skin cancer, sleep apnea, BPH, TURP, orthostatic hypotension, syncopal episodes; arthritic changes in spine   Limitations DBS, falls, orthostatic hypotension   Patient Stated Goals improve finger dexterity   Currently in Pain? No/denies        In supine, with towel roll positioned along spine for stretch.   Then progressed to scapular retraction in this position followed by scapular depression with min cueing.  Shoulder flex  closed chain with BUEs for stretch.  In sidelying, PWR! Twist in prone to each side with min v.c. For breath control and technique.  Sidelying, shoulder abduction/trunk stretch with min cues.  Manipulating cylinder pegs in each hand to flip with min-mod difficulty with R hand and mod-max difficulty with L hand.  Min-mod cueing demo/verbal for large amplitude movements.  In standing, functional reaching with LUE to place small pegs in pegboard using in-hand manipulation with min-mod difficulty/drops and for dynamic standing balance.                          OT Short Term Goals - 09/01/16 1851      OT SHORT TERM GOAL #1   Title Pt will be independent with updated PD specific HEP--STGs due 10/01/16   Time 4   Period Weeks   Status New     OT SHORT TERM GOAL #2   Title Pt will improve L hand coordination for ADLs as shown by improving time on 9-hole peg test by at least 6sec with LUE.   Baseline L-48.06sec   Time 4   Period Weeks   Status New     OT SHORT TERM GOAL #3   Title Pt will improve coordination/functional reaching for ADLs as shown by improving score on box and  blocks test by at least 4 blocks   Baseline L-39 blocks   Time 4   Period Weeks   Status New     OT SHORT TERM GOAL #4   Title Pt will be able to fasten/unfasten 3 button in less than 90sec (for improved ability to dress and improved bilateral hand coordination).   Baseline >22min/unable on tabletop   Time 4   Period Weeks   Status New     OT SHORT TERM GOAL #5   Title Pt will verbalize understanding of updated community resources prn.   Time 4   Period Weeks   Status New           OT Long Term Goals - 09/08/16 2116      OT LONG TERM GOAL #1   Title Pt will verbalize understanding of adaptive strategies to incr ease with ADL's/IADLS's--LTGs due 2/42/35   Time 8   Period Weeks   Status New     OT LONG TERM GOAL #2   Title Pt will improve coordination for ADLs as shown by  improving time on 9-hole peg test by at least 12sec with LUE.   Baseline 48.06sec   Time 8   Period Weeks   Status New     OT LONG TERM GOAL #3   Title Pt will improve functional reaching/coordination for ADLs as shown by improving score on box and blocks test by at least 8blocks with LUE.     OT LONG TERM GOAL #4   Title Pt will be able to fasten/unfasten 3 button in less than 70sec (for improved ability to dress and improved bilateral hand coordination).   Baseline unable, >74min   Time 8   Period Weeks   Status New     OT LONG TERM GOAL #5   Title Assess standing functional reach and add goal prn   Time 8   Status Deferred  09/08/16:  WNL             Patient will benefit from skilled therapeutic intervention in order to improve the following deficits and impairments:     Visit Diagnosis: Other symptoms and signs involving the nervous system  Other symptoms and signs involving the musculoskeletal system  Other lack of coordination  Other abnormalities of gait and mobility  Unsteadiness on feet  Abnormal posture  Attention and concentration deficit    Problem List Patient Active Problem List   Diagnosis Date Noted  . Parkinson disease [facility])     [facility] 09/28/2016, 3:47 PM  [facility] [place] [address] [place], [name], [address] Phone: [phone]   Fax:  [phone]  Name: Ronald Noble MRN: [id number] Date of Birth: [birth year]   Vianne Bulls, OTR/L [facility] [address] [place],   [address] [phone] phone [phone] 09/28/16 3:47 PM

## 2016-09-28 NOTE — Therapy (Signed)
[facility] [address] [place], [name], [address] Phone: [phone]   Fax:  [phone]  Physical Therapy Treatment  Patient Details  Name: Ronald Noble MRN: [id number] Date of Birth: Dec 10, 1945 Referring Provider: Rexene Alberts  Encounter Date: 09/28/2016      PT End of Session - 09/28/16 1558    Visit Number 7   Number of Visits 17   Date for PT Re-Evaluation 10/26/16   Authorization Type Medicare Primary, BCBS 2nd-GCODE every 10th visit   PT Start Time 1450   PT Stop Time 1535   PT Time Calculation (min) 45 min   Activity Tolerance Patient tolerated treatment well   Behavior During Therapy [facility] for tasks assessed/performed      Past Medical History:  Diagnosis Date  . BPH (benign prostatic hyperplasia)   . Parkinson disease ([place])   . Skin cancer of face    arms and back  . Sleep apnea    mild  . Spider bite    brown recluse/ left ankle/08/13    Past Surgical History:  Procedure Laterality Date  . BURR HOLE W/ STEREOTACTIC INSERTION OF DBS LEADS / INTRAOP MICROELECTRODE RECORDING    . COLONOSCOPY    . KNEE SURGERY     exploratory/left  . SKIN CANCER EXCISION  01/2014   face  . TRANSURETHRAL RESECTION OF PROSTATE     2008    There were no vitals filed for this visit.      Subjective Assessment - 09/28/16 1456    Subjective Doing well, no changes.  Has been doing the trunk stretches.     Pertinent History s/p DBS, skin cancer, sleep apnea, BPH, TURP, orthostatic hypotension, syncopal episodes; arthritic changes in spine   Patient Stated Goals Pt's goals for therapy are to improve strength, posture, and agility with movement.   Currently in Pain? No/denies   Pain Onset 1 to 4 weeks ago                         [facility] Adult PT Treatment/Exercise - 09/28/16 1544      Ambulation/Gait   Ambulation/Gait Yes   Ambulation/Gait Assistance 5: Supervision;7: Independent   Ambulation/Gait Assistance Details supervision for cues to incr stride length and arm swing   Ambulation Distance (Feet) 300 Feet   Assistive device None   Gait Pattern Step-through pattern;Decreased arm swing - right;Decreased arm swing - left;Decreased step length - right;Decreased step length - left;Decreased trunk rotation;Narrow base of support   Ambulation Surface Level;Indoor   Pre-Gait Activities very slow gait sequence with heel-toe-wtshift-swing leg, etc. Pt with incr dificulty when arms added. has more difficulty with balance when slows down     Posture/Postural Control   Posture/Postural Control Postural limitations   Postural Limitations Rounded Shoulders;Forward head;Weight shift right   Posture Comments standing lateral reaching with lower body still (stretchiing through sides/waist); x 10; supine and sitting chin tuck with multii-modal cues           PWR [facility]) - 09/28/16 1550    PWR! exercises Moves in standing   PWR! Rock x 10   PWR! Twist x10   PWR! Step x10   Comments continues to require vc's for technique; admits he tends to do standing PWR least often at home; +unsteady           Balance Exercises - 09/28/16 1552      Balance Exercises: Standing   Standing Eyes Opened Narrow  base of support (BOS);Head turns;Foam/compliant surface;30 secs  blue mat on ramp; facing up & down   Standing Eyes Closed Narrow base of support (BOS);Head turns;Foam/compliant surface;30 secs  blue mat on ramp; facing up & down   Tandem Stance Eyes open;Foam/compliant surface;5 reps  blue mat; x 7 seconds max   SLS Eyes open;Foam/compliant surface;Intermittent upper extremity support;5 reps;20 secs  on green disc in // bars   Stepping Strategy Anterior;Foam/compliant surface;10 reps  blue mat   Marching Limitations on ramp on blue mat, facing up and down   Other Standing Exercises step taps to 6" and then 12" steps including arm swing in opposition           PT Education -  09/28/16 1556    Education provided Yes   Education Details posture; see HEP   Person(s) Educated Patient   Methods Explanation;Demonstration;Tactile cues;Verbal cues;Handout   Comprehension Verbalized understanding;Returned demonstration;Verbal cues required;Tactile cues required;Need further instruction          PT Short Term Goals - 09/24/16 6045      PT SHORT TERM GOAL #1   Title Pt will be independent with HEP for improved balance, posture, functional mobility.  TARGET 09/26/16   Baseline Needs minimal cues for technique   Time 4   Period Weeks   Status Partially Met     PT SHORT TERM GOAL #2   Title Pt will perform at least 8 of 10 reps of sit<>stand transfers from <18 surfaces, independently, no posterior lean, for improved transfer efficiency and safety.   Time 4   Period Weeks   Status Achieved     PT SHORT TERM GOAL #3   Title Pt will verbalize at least 3 means to reduce pain in low back with functional activities.   Time 4   Period Weeks   Status Achieved     PT SHORT TERM GOAL #4   Title Pt will improve posterior push and release test to less than 2 steps to independently recover balance in posterior direction.   Time 4   Period Weeks   Status Achieved           PT Long Term Goals - 08/27/16 1325      PT LONG TERM GOAL #1   Title Pt will verbalize understanding of fall prevention in home environment.  TARGET 10/26/16   Time 8   Period Weeks   Status New     PT LONG TERM GOAL #2   Title Pt will improve MiniBESTest score to at least 22/28 for decreased fall risk.   Time 8   Period Weeks   Status New     PT LONG TERM GOAL #3   Title Pt will verbalize understanding of techniques to reduce festination/freezing of gait.   Time 8   Period Weeks   Status New     PT LONG TERM GOAL #4   Title Pt will verbalize plans for continued community fitness upon D/C from PT.   Time 8   Period Weeks   Status New     PT LONG TERM GOAL #5   Title Pt will  ambulate at least 1000 ft, indoor and outdoor surfaces, independently, no LOB, for improved community gait.   Time 8   Period Weeks   Status New               Plan - 09/28/16 1558    Clinical Impression Statement Session focused on weightshifting with balance challenges and  for postural correction. On compliant surface with narrow BOS with pt unsteadiness and required nearly constant UE support. Overall, pt's posture looked markedly improved with right lateral shift of torso much less. Patient will continue to benefit from PT.    Rehab Potential Good   PT Frequency 2x / week   PT Duration 8 weeks  plus eval   PT Treatment/Interventions ADLs/Self Care Home Management;Functional mobility training;Gait training;Patient/family education;Therapeutic activities;Therapeutic exercise;Balance training;Neuromuscular re-education   PT Next Visit Plan Continue ankle stability exercises and balance training compliant surfaces   Consulted and Agree with Plan of Care Patient      Patient will benefit from skilled therapeutic intervention in order to improve the following deficits and impairments:  Abnormal gait, Decreased balance, Decreased mobility, Decreased strength, Difficulty walking, Postural dysfunction, Pain  Visit Diagnosis: Other abnormalities of gait and mobility     Problem List Patient Active Problem List   Diagnosis Date Noted  . Parkinson disease ([place])     Rexanne Mano, PT 09/28/2016, 4:03 PM  [facility] [address] [place], [name], [address] Phone: [phone]   Fax:  [phone]  Name: Ronald Noble MRN: [id number] Date of Birth: 09-Jul-1945

## 2016-09-28 NOTE — Patient Instructions (Signed)
Axial Extension (Chin Tuck)    Lie down with thin pillow or towel folded to ~1 inch thick. Pull chin in and lengthen back of neck as if touching the top of your neck to the floor. Hold ___30-60_ seconds while counting out loud. Repeat __3__ times. Do __1__ sessions per day.  http://gt2.exer.us/449   Copyright  VHI. All rights reserved.

## 2016-10-01 ENCOUNTER — Ambulatory Visit: Payer: Medicare Other | Admitting: Occupational Therapy

## 2016-10-01 ENCOUNTER — Ambulatory Visit: Payer: Medicare Other | Admitting: Physical Therapy

## 2016-10-01 DIAGNOSIS — R29818 Other symptoms and signs involving the nervous system: Secondary | ICD-10-CM

## 2016-10-01 DIAGNOSIS — R2689 Other abnormalities of gait and mobility: Secondary | ICD-10-CM | POA: Diagnosis not present

## 2016-10-01 DIAGNOSIS — R2681 Unsteadiness on feet: Secondary | ICD-10-CM

## 2016-10-01 DIAGNOSIS — R293 Abnormal posture: Secondary | ICD-10-CM

## 2016-10-01 DIAGNOSIS — R29898 Other symptoms and signs involving the musculoskeletal system: Secondary | ICD-10-CM

## 2016-10-01 DIAGNOSIS — R41840 Attention and concentration deficit: Secondary | ICD-10-CM

## 2016-10-01 DIAGNOSIS — M25612 Stiffness of left shoulder, not elsewhere classified: Secondary | ICD-10-CM

## 2016-10-01 DIAGNOSIS — M25611 Stiffness of right shoulder, not elsewhere classified: Secondary | ICD-10-CM

## 2016-10-01 DIAGNOSIS — R278 Other lack of coordination: Secondary | ICD-10-CM

## 2016-10-01 NOTE — Therapy (Signed)
[facility] [address] [place], [name], [address] Phone: [phone]   Fax:  [phone]  Occupational Therapy Treatment  Patient Details  Name: Ronald Noble MRN: [id number] Date of Birth: 03-05-1946 Referring Provider: Dr. Star Age  Encounter Date: 10/01/2016      OT End of Session - 10/01/16 1537    Visit Number 6   Number of Visits 17   Date for OT Re-Evaluation 10/31/16   Authorization Type Medicare/BCBS - G Code needed   Authorization - Visit Number 6   Authorization - Number of Visits 10   OT Start Time 3295   OT Stop Time 1615   OT Time Calculation (min) 40 min   Activity Tolerance Patient tolerated treatment well   Behavior During Therapy [facility] for tasks assessed/performed      Past Medical History:  Diagnosis Date  . BPH (benign prostatic hyperplasia)   . Parkinson disease ([place])   . Skin cancer of face    arms and back  . Sleep apnea    mild  . Spider bite    brown recluse/ left ankle/08/13    Past Surgical History:  Procedure Laterality Date  . BURR HOLE W/ STEREOTACTIC INSERTION OF DBS LEADS / INTRAOP MICROELECTRODE RECORDING    . COLONOSCOPY    . KNEE SURGERY     exploratory/left  . SKIN CANCER EXCISION  01/2014   face  . TRANSURETHRAL RESECTION OF PROSTATE     2008    There were no vitals filed for this visit.      Subjective Assessment - 10/01/16 1536    Subjective  doing good   Pertinent History PD since 2008, s/p DBS 06/2014, skin cancer, sleep apnea, BPH, TURP, orthostatic hypotension, syncopal episodes; arthritic changes in spine   Limitations DBS, falls, orthostatic hypotension   Patient Stated Goals improve finger dexterity   Currently in Pain? No/denies       In supine AAROM shoulder flex stretch with ball BUEs followed by diagonals to each side with min cueing.  Scapular depression with min cueing.  In prone, scapular exercises in prone with shoulders in  extension, abduction, and flex x10-15 each.  Checked STGs and discussed progress--see below for scores/status, all STGs met.                         OT Education - 10/01/16 1713    Education Details [employer] (emphasizing cycling, PWR! moves, POP, and community education events, PD organization resources); Also educated pt in risk for shoulder pain with PD and why (including that dystonia/dyskinesia can also contribute to risk)--discussed ways to prevent future complications and recommended against [employer] due to risk of shoulder injury with repetitive, intense, shoulder movements   Person(s) Educated Patient   Methods Explanation;Handout   Comprehension Verbalized understanding          OT Short Term Goals - 10/01/16 1548      OT SHORT TERM GOAL #1   Title Pt will be independent with updated PD specific HEP--STGs due 10/01/16   Time 4   Period Weeks   Status Achieved  10/01/16     OT SHORT TERM GOAL #2   Title Pt will improve L hand coordination for ADLs as shown by improving time on 9-hole peg test by at least 6sec with LUE.   Baseline L-48.06sec   Time 4   Period Weeks   Status  Achieved  10/01/16:  30.34sec     OT SHORT TERM GOAL #3   Title Pt will improve coordination/functional reaching for ADLs as shown by improving score on box and blocks test by at least 4 blocks   Baseline L-39 blocks   Time 4   Period Weeks   Status Achieved  10/01/16: 43 blocks     OT SHORT TERM GOAL #4   Title Pt will be able to fasten/unfasten 3 button in less than 90sec (for improved ability to dress and improved bilateral hand coordination).   Baseline >34mn/unable on tabletop   Time 4   Period Weeks   Status Achieved  10/01/16: 38.81sec      OT SHORT TERM GOAL #5   Title Pt will verbalize understanding of updated community resources prn.   Time 4   Period Weeks   Status Achieved  10/01/16           OT Long  Term Goals - 10/01/16 1625      OT LONG TERM GOAL #1   Title Pt will verbalize understanding of adaptive strategies to incr ease with ADL's/IADLS's--LTGs due 74/85/46  Time 8   Period Weeks   Status New     OT LONG TERM GOAL #2   Title Pt will improve coordination for ADLs as shown by improving time on 9-hole peg test by at least 12sec with LUE.   Baseline 48.06sec   Time 8   Period Weeks   Status Achieved  10/01/16     OT LONG TERM GOAL #3   Title Pt will improve functional reaching/coordination for ADLs as shown by improving score on box and blocks test by at least 8blocks with LUE.   Status New     OT LONG TERM GOAL #4   Title Pt will be able to fasten/unfasten 3 button in less than 70sec (for improved ability to dress and improved bilateral hand coordination).   Baseline unable, >244m   Time 8   Period Weeks   Status Achieved  10/01/16     OT LONG TERM GOAL #5   Title Assess standing functional reach and add goal prn   Time 8   Status Deferred  09/08/16:  WNL               Plan - 10/01/16 1538    Clinical Impression Statement Pt is progressing well with all STGs met.   Pt continues to need reinforcement of avoiding compensation due to dystonia and cueing for large amplitude movement strategies with coordination tasks.   Rehab Potential Good   Current Impairments/barriers affecting progress: decr awareness, s/p DBS   OT Frequency 2x / week   OT Duration 8 weeks  +eval   OT Treatment/Interventions Self-care/ADL training;Moist Heat;DME and/or AE instruction;Patient/family education;Therapeutic exercises;Therapeutic activities;Neuromuscular education;Functional Mobility Training;Passive range of motion;Manual Therapy;Cognitive remediation/compensation;Visual/perceptual remediation/compensation;Energy conservation   Plan continue with coordination, complex functional movements/divided attention   OT Home Exercise Plan Education provided:  Reviewed PWR! hands and  coordination HEP, reviewed PWR! moves (up, rock, twist) in supine, quadraped, and prone   Consulted and Agree with Plan of Care Patient      Patient will benefit from skilled therapeutic intervention in order to improve the following deficits and impairments:  Decreased coordination, Difficulty walking, Impaired flexibility, Improper body mechanics, Decreased endurance, Decreased safety awareness, Improper spinal/pelvic alignment, Decreased knowledge of precautions, Decreased activity tolerance, Impaired tone, Decreased balance, Decreased knowledge of use of DME, Impaired UE functional use, Decreased cognition,  Decreased mobility, Impaired vision/preception, Impaired perceived functional ability  Visit Diagnosis: Other symptoms and signs involving the nervous system  Other symptoms and signs involving the musculoskeletal system  Other lack of coordination  Other abnormalities of gait and mobility  Unsteadiness on feet  Abnormal posture  Attention and concentration deficit  Stiffness of right shoulder, not elsewhere classified  Stiffness of left shoulder, not elsewhere classified    Problem List Patient Active Problem List   Diagnosis Date Noted  . Parkinson disease [facility])     [facility] 10/01/2016, 5:16 PM  [place] [address] [place], [name], [address] Phone: [phone]   Fax:  [phone]  Name: Kobi Mario MRN: [id number] Date of Birth: Aug 15, 1945   Vianne Bulls, OTR/L [facility] [address] [place], [place]  [address] [phone] phone [phone] 10/01/16 5:16 PM

## 2016-10-02 NOTE — Therapy (Signed)
[facility] [address] [place], [place], [address] Phone: [phone]   Fax:  [phone]  Physical Therapy Treatment  Patient Details  Name: Ronald Noble MRN: [id number] Date of Birth: 01/21/1946 Referring Provider: Athar  Encounter Date: 10/01/2016      PT End of Session - 10/02/16 0831    Visit Number 8   Number of Visits 17   Date for PT Re-Evaluation 10/26/16   Authorization Type Medicare Primary, BCBS 2nd-GCODE every 10th visit   PT Start Time 1452   PT Stop Time 1530   PT Time Calculation (min) 38 min   Equipment Utilized During Treatment Gait belt   Activity Tolerance Patient tolerated treatment well   Behavior During Therapy WFL for tasks assessed/performed      Past Medical History:  Diagnosis Date  . BPH (benign prostatic hyperplasia)   . Parkinson disease (HCC)   . Skin cancer of face    arms and back  . Sleep apnea    mild  . Spider bite    brown recluse/ left ankle/08/13    Past Surgical History:  Procedure Laterality Date  . BURR HOLE W/ STEREOTACTIC INSERTION OF DBS LEADS / INTRAOP MICROELECTRODE RECORDING    . COLONOSCOPY    . KNEE SURGERY     exploratory/left  . SKIN CANCER EXCISION  01/2014   face  . TRANSURETHRAL RESECTION OF PROSTATE     2008    There were no vitals filed for this visit.      Subjective Assessment - 10/01/16 1454    Subjective Feel like I'm more aware of my posture, especially with my back exercises.   Pertinent History s/p DBS, skin cancer, sleep apnea, BPH, TURP, orthostatic hypotension, syncopal episodes; arthritic changes in spine   Patient Stated Goals Pt's goals for therapy are to improve strength, posture, and agility with movement.   Currently in Pain? No/denies   Pain Onset 1 to 4 weeks ago                         [facility] Adult PT Treatment/Exercise - 10/02/16 0001      Exercises   Other Exercises  Reviewed postural  exercise provided last visit:  supine chin tucks x 5 reps with flat pillow under head.  Pt needs cues for technique and to relax shoulders during activity; standing postural re-education standing at doorframe:  neck retraction/chin tucks x 5 reps, with pt requiring extra time and cues for technique and to relax shoulders.             Balance Exercises - 10/02/16 0824      Balance Exercises: Standing   Standing Eyes Opened Wide (BOA);Foam/compliant surface;Head turns;5 reps  Head nods; on incline of ramp   Standing Eyes Closed Wide (BOA);Head turns;Foam/compliant surface;5 reps  Head nods on incline of ramp   Stepping Strategy Anterior;Posterior;Lateral;10 reps  alternating legs, with coordinated UEs   Rockerboard Anterior/posterior;Lateral;Head turns;EO  Head nods; hip/ankle strategy work; arm swing   Gait with Head Turns Forward;Retro;2 reps   Other Standing Exercises On ramp incline:  marching in place x 12 reps, forward step taps x 12 reps, then forward>back step taps x 12 reps.  Multidirectional stepping activity following step strategy work noted above:  varied direction stepping with added cognitive load, multiple reps on solid surface-no LOB             PT Short Term Goals -   09/24/16 0833      PT SHORT TERM GOAL #1   Title Pt will be independent with HEP for improved balance, posture, functional mobility.  TARGET 09/26/16   Baseline Needs minimal cues for technique   Time 4   Period Weeks   Status Partially Met     PT SHORT TERM GOAL #2   Title Pt will perform at least 8 of 10 reps of sit<>stand transfers from <18 surfaces, independently, no posterior lean, for improved transfer efficiency and safety.   Time 4   Period Weeks   Status Achieved     PT SHORT TERM GOAL #3   Title Pt will verbalize at least 3 means to reduce pain in low back with functional activities.   Time 4   Period Weeks   Status Achieved     PT SHORT TERM GOAL #4   Title Pt will improve  posterior push and release test to less than 2 steps to independently recover balance in posterior direction.   Time 4   Period Weeks   Status Achieved           PT Long Term Goals - 08/27/16 1325      PT LONG TERM GOAL #1   Title Pt will verbalize understanding of fall prevention in home environment.  TARGET 10/26/16   Time 8   Period Weeks   Status New     PT LONG TERM GOAL #2   Title Pt will improve MiniBESTest score to at least 22/28 for decreased fall risk.   Time 8   Period Weeks   Status New     PT LONG TERM GOAL #3   Title Pt will verbalize understanding of techniques to reduce festination/freezing of gait.   Time 8   Period Weeks   Status New     PT LONG TERM GOAL #4   Title Pt will verbalize plans for continued community fitness upon D/C from PT.   Time 8   Period Weeks   Status New     PT LONG TERM GOAL #5   Title Pt will ambulate at least 1000 ft, indoor and outdoor surfaces, independently, no LOB, for improved community gait.   Time 8   Period Weeks   Status New               Plan - 10/02/16 0831    Clinical Impression Statement Pt performs multiple surface balance activities and varied direction stepping activites well, but increased rigidity/stiffness noted through bilateral shoulders, as shoulders remain hiked/elevated during most activities today, needing cues to relax.  Pt also demo decreased trunk rotation with activities.  Pt performs challenging varied direction stepping activities with additional cognitive load without LOB.  Pt will continue to benefit from skilled physical therapy to address posture, balance, dynamic gait towards LTGs.   Rehab Potential Good   PT Frequency 2x / week   PT Duration 8 weeks  plus eval   PT Treatment/Interventions ADLs/Self Care Home Management;Functional mobility training;Gait training;Patient/family education;Therapeutic activities;Therapeutic exercise;Balance training;Neuromuscular re-education   PT Next  Visit Plan Work on trunk rotation, coordinated activities with UEs and lower body and dynamic gait and balance   Consulted and Agree with Plan of Care Patient      Patient will benefit from skilled therapeutic intervention in order to improve the following deficits and impairments:  Abnormal gait, Decreased balance, Decreased mobility, Decreased strength, Difficulty walking, Postural dysfunction, Pain  Visit Diagnosis: Other symptoms and signs involving the  nervous system  Abnormal posture  Unsteadiness on feet     Problem List Patient Active Problem List   Diagnosis Date Noted  . Parkinson disease (HCC)     [name],[name] 10/02/2016, 8:36 AM  [name],[name], PT   [facility] [address] [place], [place], [address] Phone: [phone]   Fax:  [phone]  Name: Ronald Noble MRN: [id number] Date of Birth: 12/31/1945   

## 2016-10-05 ENCOUNTER — Ambulatory Visit: Payer: Medicare Other | Attending: Neurology | Admitting: Physical Therapy

## 2016-10-05 ENCOUNTER — Ambulatory Visit: Payer: Medicare Other | Admitting: Occupational Therapy

## 2016-10-05 ENCOUNTER — Ambulatory Visit: Payer: Medicare Other | Admitting: Speech Pathology

## 2016-10-05 DIAGNOSIS — R293 Abnormal posture: Secondary | ICD-10-CM | POA: Insufficient documentation

## 2016-10-05 DIAGNOSIS — R2681 Unsteadiness on feet: Secondary | ICD-10-CM | POA: Insufficient documentation

## 2016-10-05 DIAGNOSIS — R29818 Other symptoms and signs involving the nervous system: Secondary | ICD-10-CM | POA: Insufficient documentation

## 2016-10-05 DIAGNOSIS — R41841 Cognitive communication deficit: Secondary | ICD-10-CM | POA: Insufficient documentation

## 2016-10-05 DIAGNOSIS — R41840 Attention and concentration deficit: Secondary | ICD-10-CM | POA: Insufficient documentation

## 2016-10-05 DIAGNOSIS — R471 Dysarthria and anarthria: Secondary | ICD-10-CM | POA: Insufficient documentation

## 2016-10-05 DIAGNOSIS — R2689 Other abnormalities of gait and mobility: Secondary | ICD-10-CM | POA: Insufficient documentation

## 2016-10-05 DIAGNOSIS — R29898 Other symptoms and signs involving the musculoskeletal system: Secondary | ICD-10-CM | POA: Insufficient documentation

## 2016-10-05 DIAGNOSIS — R278 Other lack of coordination: Secondary | ICD-10-CM | POA: Insufficient documentation

## 2016-10-08 ENCOUNTER — Ambulatory Visit: Payer: Medicare Other

## 2016-10-08 ENCOUNTER — Ambulatory Visit: Payer: Medicare Other | Admitting: Physical Therapy

## 2016-10-08 ENCOUNTER — Ambulatory Visit: Payer: Medicare Other | Admitting: Occupational Therapy

## 2016-10-08 DIAGNOSIS — R2689 Other abnormalities of gait and mobility: Secondary | ICD-10-CM

## 2016-10-08 DIAGNOSIS — R471 Dysarthria and anarthria: Secondary | ICD-10-CM | POA: Diagnosis not present

## 2016-10-08 DIAGNOSIS — R29898 Other symptoms and signs involving the musculoskeletal system: Secondary | ICD-10-CM

## 2016-10-08 DIAGNOSIS — R293 Abnormal posture: Secondary | ICD-10-CM | POA: Diagnosis not present

## 2016-10-08 DIAGNOSIS — R41841 Cognitive communication deficit: Secondary | ICD-10-CM

## 2016-10-08 DIAGNOSIS — R41840 Attention and concentration deficit: Secondary | ICD-10-CM

## 2016-10-08 DIAGNOSIS — R29818 Other symptoms and signs involving the nervous system: Secondary | ICD-10-CM | POA: Diagnosis not present

## 2016-10-08 DIAGNOSIS — R278 Other lack of coordination: Secondary | ICD-10-CM

## 2016-10-08 DIAGNOSIS — R2681 Unsteadiness on feet: Secondary | ICD-10-CM | POA: Diagnosis not present

## 2016-10-08 NOTE — Patient Instructions (Signed)

## 2016-10-08 NOTE — Therapy (Signed)
[facility] [address] [place], [name], [address] Phone: [phone]   Fax:  [phone]  Physical Therapy Treatment  Patient Details  Name: Ronald Noble MRN: [id number] Date of Birth: Oct 06, 1945 Referring Provider: Rexene Alberts  Encounter Date: 10/08/2016      PT End of Session - 10/08/16 1458    Visit Number 9   Number of Visits 17   Date for PT Re-Evaluation 10/26/16   Authorization Type Medicare Primary, BCBS 2nd-GCODE every 10th visit   PT Start Time 1404   PT Stop Time 1444   PT Time Calculation (min) 40 min   Activity Tolerance Patient tolerated treatment well   Behavior During Therapy [facility] for tasks assessed/performed      Past Medical History:  Diagnosis Date  . BPH (benign prostatic hyperplasia)   . Parkinson disease ([place])   . Skin cancer of face    arms and back  . Sleep apnea    mild  . Spider bite    brown recluse/ left ankle/08/13    Past Surgical History:  Procedure Laterality Date  . BURR HOLE W/ STEREOTACTIC INSERTION OF DBS LEADS / INTRAOP MICROELECTRODE RECORDING    . COLONOSCOPY    . KNEE SURGERY     exploratory/left  . SKIN CANCER EXCISION  01/2014   face  . TRANSURETHRAL RESECTION OF PROSTATE     2008    There were no vitals filed for this visit.      Subjective Assessment - 10/08/16 1406    Subjective Feel like each day is different.  Appreciate all we're working on.   Pertinent History s/p DBS, skin cancer, sleep apnea, BPH, TURP, orthostatic hypotension, syncopal episodes; arthritic changes in spine   Patient Stated Goals Pt's goals for therapy are to improve strength, posture, and agility with movement.   Currently in Pain? No/denies                         [facility] Adult PT Treatment/Exercise - 10/08/16 0001      Ambulation/Gait   Ambulation/Gait Yes   Ambulation/Gait Assistance 5: Supervision;7: Independent   Ambulation Distance (Feet) 520 Feet   indoors, then 250 outdoors   Assistive device None   Gait Pattern Step-through pattern;Decreased step length - right;Decreased step length - left;Decreased trunk rotation;Narrow base of support   Ambulation Surface Level;Indoor;Unlevel;Outdoor   Gait Comments Gait on outdoor surfaces, including grass, gravel, mulch, and sand, with turns and change of direction; gait on grass with conversation tasks.  No LOB noted.     Self-Care   Self-Care Other Self-Care Comments   Other Self-Care Comments  Fall prevention education provided.  Discussed fall prevention in regards to pt's PD deficits, to home environment and upcoming vacation environment.         Neuro Re-education: Standing postural re-education exercises at doorframe:  Neck retraction x 10 reps  -Scapular retraction x 5 reps     Balance Exercises - 10/08/16 1450      Balance Exercises: Standing   Standing Eyes Opened Wide (BOA);Foam/compliant surface;Head turns;5 reps  Head nods; on incline/decline of ramp   Rockerboard Anterior/posterior;Lateral;Head turns;EO  Head nods; hip/ankle strategy work/coordinated UEs   Other Standing Exercises On ramp incline/decline:  marching in place x 10, forward step taps x 10; standing in corner trunk rotation activities reaching across body in corner x 10 reps, then diagonal reaching x 10 reps each direction, then in hallway with back  towards wall-reaching across body to touch wall, x 10 reps, for improved trunk rotation.      Self Care:     PT Education - 10/08/16 1457    Education provided Yes   Education Details Fall prevention education   Person(s) Educated Patient   Methods Explanation;Handout   Comprehension Verbalized understanding          PT Short Term Goals - 09/24/16 0833      PT SHORT TERM GOAL #1   Title Pt will be independent with HEP for improved balance, posture, functional mobility.  TARGET 09/26/16   Baseline Needs minimal cues for technique   Time 4   Period  Weeks   Status Partially Met     PT SHORT TERM GOAL #2   Title Pt will perform at least 8 of 10 reps of sit<>stand transfers from <18 surfaces, independently, no posterior lean, for improved transfer efficiency and safety.   Time 4   Period Weeks   Status Achieved     PT SHORT TERM GOAL #3   Title Pt will verbalize at least 3 means to reduce pain in low back with functional activities.   Time 4   Period Weeks   Status Achieved     PT SHORT TERM GOAL #4   Title Pt will improve posterior push and release test to less than 2 steps to independently recover balance in posterior direction.   Time 4   Period Weeks   Status Achieved           PT Long Term Goals - 08/27/16 1325      PT LONG TERM GOAL #1   Title Pt will verbalize understanding of fall prevention in home environment.  TARGET 10/26/16   Time 8   Period Weeks   Status New     PT LONG TERM GOAL #2   Title Pt will improve MiniBESTest score to at least 22/28 for decreased fall risk.   Time 8   Period Weeks   Status New     PT LONG TERM GOAL #3   Title Pt will verbalize understanding of techniques to reduce festination/freezing of gait.   Time 8   Period Weeks   Status New     PT LONG TERM GOAL #4   Title Pt will verbalize plans for continued community fitness upon D/C from PT.   Time 8   Period Weeks   Status New     PT LONG TERM GOAL #5   Title Pt will ambulate at least 1000 ft, indoor and outdoor surfaces, independently, no LOB, for improved community gait.   Time 8   Period Weeks   Status New               Plan - 10/08/16 1458    Clinical Impression Statement Pt appears to have more relaxed posture and improved reciprocal arm swing with gait activities, especially directly following trunk rotation activities.  With compliant surface negotiation, pt needs occaional min guard cues to prevent LOB posteriorly.  Pt will continue to work towards [place].   Rehab Potential Good   PT Frequency 2x / week    PT Duration 8 weeks  plus eval   PT Treatment/Interventions ADLs/Self Care Home Management;Functional mobility training;Gait training;Patient/family education;Therapeutic activities;Therapeutic exercise;Balance training;Neuromuscular re-education   PT Next Visit Plan Continue work on trunk rotation (maybe add to HEP), begin looking at LTGs and plan for d/c next 1-2 weeks.  GCODE next visit   Consulted  and Agree with Plan of Care Patient      Patient will benefit from skilled therapeutic intervention in order to improve the following deficits and impairments:  Abnormal gait, Decreased balance, Decreased mobility, Decreased strength, Difficulty walking, Postural dysfunction, Pain  Visit Diagnosis: Other abnormalities of gait and mobility  Abnormal posture     Problem List Patient Active Problem List   Diagnosis Date Noted  . Parkinson disease ([place])     Karmello Abercrombie W. 10/08/2016, 3:06 PM  Frazier Butt., PT  [facility] [address] [place], [name], [address] Phone: [phone]   Fax:  [phone]  Name: Ronald Noble MRN: [id number] Date of Birth: [birth year]

## 2016-10-08 NOTE — Therapy (Signed)
[facility] [address] [place], [name], [address] Phone: [phone]   Fax:  [phone]  Occupational Therapy Treatment  Patient Details  Name: Ronald Noble MRN: [id number] Date of Birth: [birth year] Referring Provider: Dr. Star Age  Encounter Date: 10/08/2016      OT End of Session - 10/08/16 1455    Visit Number 7   Number of Visits 17   Date for OT Re-Evaluation 10/31/16   Authorization Type Medicare/BCBS - G Code needed   Authorization - Visit Number 7   Authorization - Number of Visits 10   OT Start Time 3570   OT Stop Time 1530   OT Time Calculation (min) 38 min   Activity Tolerance Patient tolerated treatment well   Behavior During Therapy [facility] for tasks assessed/performed      Past Medical History:  Diagnosis Date  . BPH (benign prostatic hyperplasia)   . Parkinson disease ([place])   . Skin cancer of face    arms and back  . Sleep apnea    mild  . Spider bite    brown recluse/ left ankle/08/13    Past Surgical History:  Procedure Laterality Date  . BURR HOLE W/ STEREOTACTIC INSERTION OF DBS LEADS / INTRAOP MICROELECTRODE RECORDING    . COLONOSCOPY    . KNEE SURGERY     exploratory/left  . SKIN CANCER EXCISION  01/2014   face  . TRANSURETHRAL RESECTION OF PROSTATE     2008    There were no vitals filed for this visit.      Subjective Assessment - 10/08/16 1454    Subjective  doing good   Pertinent History PD since 2008, s/p DBS 06/2014, skin cancer, sleep apnea, BPH, TURP, orthostatic hypotension, syncopal episodes; arthritic changes in spine   Limitations DBS, falls, orthostatic hypotension   Patient Stated Goals improve finger dexterity   Currently in Pain? No/denies      PWR! Moves (up, rock, twist) in quadruped x 10-20 each with min cues For incr movement amplitude/technique.   In standing,  functional reaching in diagonal pattern incorporating trunk rotation/wt.  shift and PWR! Reach to toss scarves with PWR! Hands  with min cues for incr movment amplitude.  Pt with LOB to the L, but able to recover without assist.  In standing, functional step and reach to the side with each UE/LE to flip large cards with focus on timing and coordination of UE/LE, min v.c. Given for timing and large amplitude movements, LOB x1 to the L, but pt able to recover without assist.  Completing [place]urdue Pegboard with each hand with min v.c. For use of PWR! Hands and to avoid shoulder compensation.  Pt demo min difficulty with coordination.  Fastening/unfastening sleeve button and watch with min difficulty/incr time.  Manipulating coins in each hand to place in coin bank with min-mod difficulty and min cueing for L shoulder hike.                          OT Short Term Goals - 10/01/16 1548      OT SHORT TERM GOAL #1   Title Pt will be independent with updated PD specific HEP--STGs due 10/01/16   Time 4   Period Weeks   Status Achieved  10/01/16     OT SHORT TERM GOAL #2   Title Pt will improve L hand coordination for ADLs as shown by improving time on 9-hole peg test by  at least 6sec with LUE.   Baseline L-48.06sec   Time 4   Period Weeks   Status Achieved  10/01/16:  30.34sec     OT SHORT TERM GOAL #3   Title Pt will improve coordination/functional reaching for ADLs as shown by improving score on box and blocks test by at least 4 blocks   Baseline L-39 blocks   Time 4   Period Weeks   Status Achieved  10/01/16: 43 blocks     OT SHORT TERM GOAL #4   Title Pt will be able to fasten/unfasten 3 button in less than 90sec (for improved ability to dress and improved bilateral hand coordination).   Baseline >45min/unable on tabletop   Time 4   Period Weeks   Status Achieved  10/01/16: 38.81sec      OT SHORT TERM GOAL #5   Title Pt will verbalize understanding of updated community resources prn.   Time 4   Period Weeks   Status Achieved  10/01/16            OT Long Term Goals - 10/01/16 1625      OT LONG TERM GOAL #1   Title Pt will verbalize understanding of adaptive strategies to incr ease with ADL's/IADLS's--LTGs due 6/38/75   Time 8   Period Weeks   Status New     OT LONG TERM GOAL #2   Title Pt will improve coordination for ADLs as shown by improving time on 9-hole peg test by at least 12sec with LUE.   Baseline 48.06sec   Time 8   Period Weeks   Status Achieved  10/01/16     OT LONG TERM GOAL #3   Title Pt will improve functional reaching/coordination for ADLs as shown by improving score on box and blocks test by at least 8blocks with LUE.   Status New     OT LONG TERM GOAL #4   Title Pt will be able to fasten/unfasten 3 button in less than 70sec (for improved ability to dress and improved bilateral hand coordination).   Baseline unable, >55min   Time 8   Period Weeks   Status Achieved  10/01/16     OT LONG TERM GOAL #5   Title Assess standing functional reach and add goal prn   Time 8   Status Deferred  09/08/16:  WNL               Plan - 10/08/16 1512    Clinical Impression Statement Pt continues to progress towards goals, but demo some difficulty with step and reach to the left with 2-3 LOB today (pt able to recover unassisted).   Rehab Potential Good   Current Impairments/barriers affecting progress: decr awareness, s/p DBS   OT Frequency 2x / week   OT Duration 8 weeks  +eval   OT Treatment/Interventions Self-care/ADL training;Moist Heat;DME and/or AE instruction;Patient/family education;Therapeutic exercises;Therapeutic activities;Neuromuscular education;Functional Mobility Training;Passive range of motion;Manual Therapy;Cognitive remediation/compensation;Visual/perceptual remediation/compensation;Energy conservation   Plan continue with coordination, complex functional movements   OT Home Exercise Plan Education provided:  Reviewed PWR! hands and coordination HEP, reviewed PWR! moves (up,  rock, twist) in supine, quadraped, and prone   Consulted and Agree with Plan of Care Patient      Patient will benefit from skilled therapeutic intervention in order to improve the following deficits and impairments:  Decreased coordination, Difficulty walking, Impaired flexibility, Improper body mechanics, Decreased endurance, Decreased safety awareness, Improper spinal/pelvic alignment, Decreased knowledge of precautions, Decreased activity tolerance, Impaired tone,  Decreased balance, Decreased knowledge of use of DME, Impaired UE functional use, Decreased cognition, Decreased mobility, Impaired vision/preception, Impaired perceived functional ability  Visit Diagnosis: Other symptoms and signs involving the nervous system  Other symptoms and signs involving the musculoskeletal system  Other lack of coordination  Other abnormalities of gait and mobility  Abnormal posture  Attention and concentration deficit    Problem List Patient Active Problem List   Diagnosis Date Noted  . Parkinson disease ([place])     Vianne Bulls 10/08/2016, 3:13 PM  [facility] [place] [address] [place], [name], [address] Phone: [phone]   Fax:  [phone]  Name: Ronald Noble MRN: [id number] Date of Birth: January 13, 1946   Vianne Bulls, OTR/L [facility] [address] [place], [place]  [address] [phone] phone [phone] 10/08/16 3:13 PM

## 2016-10-08 NOTE — Therapy (Signed)
[facility] [address] [place], [name], [address] Phone: [phone]   Fax:  [phone]  Speech Language Pathology Treatment  Patient Details  Name: Ronald Noble MRN: [id number] Date of Birth: March 23, 1946 Referring Provider: Star Age, MD  Encounter Date: 10/08/2016      End of Session - 10/08/16 1623    Visit Number 8   Number of Visits 17   Date for SLP Re-Evaluation 10/30/16   SLP Start Time 1533   SLP Stop Time  1615   SLP Time Calculation (min) 42 min   Activity Tolerance Patient tolerated treatment well      Past Medical History:  Diagnosis Date  . BPH (benign prostatic hyperplasia)   . Parkinson disease ([place])   . Skin cancer of face    arms and back  . Sleep apnea    mild  . Spider bite    brown recluse/ left ankle/08/13    Past Surgical History:  Procedure Laterality Date  . BURR HOLE W/ STEREOTACTIC INSERTION OF DBS LEADS / INTRAOP MICROELECTRODE RECORDING    . COLONOSCOPY    . KNEE SURGERY     exploratory/left  . SKIN CANCER EXCISION  01/2014   face  . TRANSURETHRAL RESECTION OF PROSTATE     2008    There were no vitals filed for this visit.      Subjective Assessment - 10/08/16 1540    Subjective "I've been using my voice recognition software - it gives me a visual test grade right there."   Currently in Pain? No/denies               ADULT SLP TREATMENT - 10/08/16 1542      General Information   Behavior/Cognition Alert;Cooperative;Pleasant mood     Treatment Provided   Treatment provided Cognitive-Linquistic     Cognitive-Linquistic Treatment   Treatment focused on Dysarthria   Skilled Treatment Pt persistent with initial conversation (pre loud /a/) marked by consistent short rushes of speech, speaking on residual air, moderately decreased amplitude (68-69dB at 30 cm) hindering pt's overall intelligiblity. SLP targeted recalibration of pt's speech  loudness using loud /a/ - average mid 90s dB. In sentence tasks, pt's loudness and breath support in structured (sentence response) speech tasks with average upper 60s dB. In multi-sentence tasks of semi-structured nature (description) pt req'd consistent verbal cues for loudness, breath, and speech clarity. 15 minutes conversation avg upper 60s- low 70s dB with consistent visual cues needed for rate and loudness.      Assessment / Recommendations / Plan   Plan Continue with current plan of care            SLP Short Term Goals - 10/08/16 1624      SLP SHORT TERM GOAL #1   Title Pt will average 93dB on loud /a/ over 3 sessions with rare min A   Status Partially Met     SLP SHORT TERM GOAL #2   Title Pt will average 72dB on structured speech tasks with rare min A   Time --   Period --   Status Achieved     SLP SHORT TERM GOAL #3   Title Pt will maintain average low 70s dB over 8 minute simple conversation with rare min A   Time --   Period --   Status Not Met     SLP SHORT TERM GOAL #4   Title pt will maintain speech intelligibility of 95%+ for 8 minutes  simple conversation over three sesssions   Time --   Period --   Status Partially Met     SLP SHORT TERM GOAL #5   Title pt will demo knowledge of 4 different memory compenstions   Time --   Period --   Status Deferred          SLP Long Term Goals - 10/08/16 1626      SLP LONG TERM GOAL #1   Title Pt will maintain 70dB over 15 minute mod complex conversation with rare min A over three therapy sessions   Baseline 09/21/16   Time 4   Period Weeks   Status On-going     SLP LONG TERM GOAL #2   Title Pt will maintain 95%+ speech intelligibility in noisy environment outside of tx room over 12 minutes with rare min A, over three sessions   Time 4   Period Weeks   Status On-going     SLP LONG TERM GOAL #3   Title pt will demo one memory compensation in 3 sessions    Time 4   Period Weeks   Status On-going      SLP LONG TERM GOAL #4   Title pt will demo anticipatory awareness to independently double check his work in [employer] complex cognitive linguistic tasks in 7 sessions   Time 4   Period Weeks   Status On-going     SLP LONG TERM GOAL #5   Title in detailed cognitive linguistic tasks pt will be 100% accurate with modified independence (double-checking his work)   Time 4   Period Weeks   Status On-going          Plan - 10/08/16 1623    Clinical Impression Statement Patient continues to display reduced awareness of speech patterns of rushes of speech, reduced articulatory precision. Pt did not recall visual cues agreed on in prior session, but SLP and pt agreed on appropriate nonverbal cues to reduce rate and increase volume, which were sometimes successful for pt. Skilled ST is necessary for recalibration of speech musculature for production of clear, intelligible speech and to improve pt's cognitive linguistic ability.   Speech Therapy Frequency 2x / week   Treatment/Interventions Compensatory strategies;Patient/family education;Multimodal communcation approach;Internal/external aids;SLP instruction and feedback;Functional tasks;Cueing hierarchy;Cognitive reorganization   Potential to Achieve Goals Fair   Potential Considerations Severity of impairments   Consulted and Agree with Plan of Care Patient      Patient will benefit from skilled therapeutic intervention in order to improve the following deficits and impairments:   Dysarthria and anarthria  Cognitive communication deficit    Problem List Patient Active Problem List   Diagnosis Date Noted  . Parkinson disease ([place])     [place] ,[place], [place]  10/08/2016, 4:27 PM  [facility] [address] [place], [name], [address] Phone: [phone]   Fax:  [phone]   Name: Ronald Noble MRN: [id number] Date of Birth: [birth year]

## 2016-10-20 ENCOUNTER — Ambulatory Visit: Payer: Medicare Other | Admitting: Physical Therapy

## 2016-10-20 ENCOUNTER — Ambulatory Visit: Payer: Medicare Other | Admitting: Occupational Therapy

## 2016-10-20 ENCOUNTER — Ambulatory Visit: Payer: Medicare Other

## 2016-10-20 DIAGNOSIS — R41841 Cognitive communication deficit: Secondary | ICD-10-CM | POA: Diagnosis not present

## 2016-10-20 DIAGNOSIS — R293 Abnormal posture: Secondary | ICD-10-CM

## 2016-10-20 DIAGNOSIS — R2689 Other abnormalities of gait and mobility: Secondary | ICD-10-CM

## 2016-10-20 DIAGNOSIS — R29898 Other symptoms and signs involving the musculoskeletal system: Secondary | ICD-10-CM

## 2016-10-20 DIAGNOSIS — R278 Other lack of coordination: Secondary | ICD-10-CM

## 2016-10-20 DIAGNOSIS — R29818 Other symptoms and signs involving the nervous system: Secondary | ICD-10-CM | POA: Diagnosis not present

## 2016-10-20 DIAGNOSIS — R471 Dysarthria and anarthria: Secondary | ICD-10-CM

## 2016-10-20 DIAGNOSIS — R2681 Unsteadiness on feet: Secondary | ICD-10-CM

## 2016-10-20 DIAGNOSIS — R41840 Attention and concentration deficit: Secondary | ICD-10-CM

## 2016-10-20 NOTE — Therapy (Signed)
[facility] [address] [place], [name], [address] Phone: [phone]   Fax:  [phone]  Speech Language Pathology Treatment  Patient Details  Name: Ronald Noble MRN: [id number] Date of Birth: May 24, 1945 Referring Provider: Star Age, MD  Encounter Date: 10/20/2016      End of Session - 10/20/16 0934    Visit Number 9   Number of Visits 17   Date for SLP Re-Evaluation 10/30/16   SLP Start Time 0849   SLP Stop Time  0929   SLP Time Calculation (min) 40 min   Activity Tolerance Patient tolerated treatment well      Past Medical History:  Diagnosis Date  . BPH (benign prostatic hyperplasia)   . Parkinson disease ([place])   . Skin cancer of face    arms and back  . Sleep apnea    mild  . Spider bite    brown recluse/ left ankle/08/13    Past Surgical History:  Procedure Laterality Date  . BURR HOLE W/ STEREOTACTIC INSERTION OF DBS LEADS / INTRAOP MICROELECTRODE RECORDING    . COLONOSCOPY    . KNEE SURGERY     exploratory/left  . SKIN CANCER EXCISION  01/2014   face  . TRANSURETHRAL RESECTION OF PROSTATE     2008    There were no vitals filed for this visit.      Subjective Assessment - 10/20/16 0855    Subjective "I was on vacation last week and did my "ah"s less than I should."   Currently in Pain? No/denies               ADULT SLP TREATMENT - 10/20/16 0858      General Information   Behavior/Cognition Alert;Cooperative;Pleasant mood     Treatment Provided   Treatment provided Cognitive-Linquistic     Cognitive-Linquistic Treatment   Treatment focused on Dysarthria   Skilled Treatment "I just have to get in the habit of thinking like I'm talking to my dictation software." SLP used loud /a/ to recalibrate pt's conversational loudness to WNL. Average low-mid 90's. SLP focused pt's attention today with speech articulation and combating rushes of speech. In mod complex  conversation SLP used spaced feedback (every 3 minutes) to cue pt if he "feels like (he) is using his [employer]obby[employer]" in order to incr pt's awareness of need for slowing rate/overarticulating. Pt's volume during this conversation remained above an average of 70dB.     Assessment / Recommendations / Plan   Plan Continue with current plan of care     Progression Toward Goals   Progression toward goals Progressing toward goals          SLP Education - 10/20/16 0932    Education provided Yes   Education Details Speech production following DBS   Person(s) Educated Patient   Methods Explanation   Comprehension Verbalized understanding          SLP Short Term Goals - 10/08/16 1624      SLP SHORT TERM GOAL #1   Title Pt will average 93dB on loud /a/ over 3 sessions with rare min A   Status Partially Met     SLP SHORT TERM GOAL #2   Title Pt will average 72dB on structured speech tasks with rare min A   Time --   Period --   Status Achieved     SLP SHORT TERM GOAL #3   Title Pt will maintain average low 70s dB over 8  minute simple conversation with rare min A   Time --   Period --   Status Not Met     SLP SHORT TERM GOAL #4   Title pt will maintain speech intelligibility of 95%+ for 8 minutes simple conversation over three sesssions   Time --   Period --   Status Partially Met     SLP SHORT TERM GOAL #5   Title pt will demo knowledge of 4 different memory compenstions   Time --   Period --   Status Deferred          SLP Long Term Goals - 10/20/16 0935      SLP LONG TERM GOAL #1   Title Pt will maintain 70dB over 15 minute mod complex conversation with rare min A over three therapy sessions   Baseline 09/21/16, 10-20-16   Time 3   Period Weeks   Status On-going     SLP LONG TERM GOAL #2   Title Pt will maintain 95%+ speech intelligibility in noisy environment outside of tx room over 12 minutes with rare min A, over three sessions   Time 3   Period  Weeks   Status On-going     SLP LONG TERM GOAL #3   Title pt will demo one memory compensation in 3 sessions    Time 3   Period Weeks   Status On-going     SLP LONG TERM GOAL #4   Title pt will demo anticipatory awareness to independently double check his work in [employer] complex cognitive linguistic tasks in 7 sessions   Time 3   Period Weeks   Status On-going     SLP LONG TERM GOAL #5   Title in detailed cognitive linguistic tasks pt will be 100% accurate with modified independence (double-checking his work)   Time 3   Period Weeks   Status On-going          Plan - 10/20/16 0934    Clinical Impression Statement Patient continues to display reduced awareness of speech patterns of rushes of speech, reduced articulatory precision. SLP used spaced cueing to incr pt's awareness. SKilled ST is necessary for recalibration of speech musculature for production of clear, intelligible speech and to improve pt's cognitive linguistic ability.   Speech Therapy Frequency 2x / week   Treatment/Interventions Compensatory strategies;Patient/family education;Multimodal communcation approach;Internal/external aids;SLP instruction and feedback;Functional tasks;Cueing hierarchy;Cognitive reorganization   Potential to Achieve Goals Fair   Potential Considerations Severity of impairments   Consulted and Agree with Plan of Care Patient      Patient will benefit from skilled therapeutic intervention in order to improve the following deficits and impairments:   Dysarthria and anarthria  Cognitive communication deficit    Problem List Patient Active Problem List   Diagnosis Date Noted  . Parkinson disease ([place])     [facility] ,[place], [place]  10/20/2016, 9:37 AM  [facility] [address] [place], [name], [address] Phone: [phone]   Fax:  [phone]   Name: Ronald Noble MRN: [id number] Date of Birth: 1946-01-29

## 2016-10-20 NOTE — Therapy (Signed)
[facility] [address] [place], [name], [address] Phone: [phone]   Fax:  [phone]  Occupational Therapy Treatment  Patient Details  Name: Ronald Noble MRN: [id number] Date of Birth: February 13, 1946 Referring Provider: Dr. Star Age  Encounter Date: 10/20/2016      OT End of Session - 10/20/16 0938    Visit Number 8   Number of Visits 17   Date for OT Re-Evaluation 10/31/16   Authorization Type Medicare/BCBS - G Code needed   Authorization - Visit Number 8   Authorization - Number of Visits 10   OT Start Time [phone]   OT Stop Time 1015   OT Time Calculation (min) 41 min   Activity Tolerance Patient tolerated treatment well   Behavior During Therapy [facility] for tasks assessed/performed      Past Medical History:  Diagnosis Date  . BPH (benign prostatic hyperplasia)   . Parkinson disease ([place])   . Skin cancer of face    arms and back  . Sleep apnea    mild  . Spider bite    brown recluse/ left ankle/08/13    Past Surgical History:  Procedure Laterality Date  . BURR HOLE W/ STEREOTACTIC INSERTION OF DBS LEADS / INTRAOP MICROELECTRODE RECORDING    . COLONOSCOPY    . KNEE SURGERY     exploratory/left  . SKIN CANCER EXCISION  01/2014   face  . TRANSURETHRAL RESECTION OF PROSTATE     2008    There were no vitals filed for this visit.      Subjective Assessment - 10/20/16 0937    Subjective  doing good   Pertinent History PD since 2008, s/p DBS 06/2014, skin cancer, sleep apnea, BPH, TURP, orthostatic hypotension, syncopal episodes; arthritic changes in spine   Limitations DBS, falls, orthostatic hypotension   Patient Stated Goals improve finger dexterity   Currently in Pain? No/denies      Neuro re-ed:  PWR! Moves (basic 4) in quadrape x 10-20 each with min  cues For incr movement amplitude/positioning.  In sitting, functional reaching to place small pegs in vertical pegboard to copy  design (for cognitive component) with set-up for large amplitude movements with each UE incorporation trunk rotation.  Manipulating 3 grooved pegs in hand to place in pegboard with mod difficulty with R hand, max difficulty with L hand.                           OT Short Term Goals - 10/01/16 1548      OT SHORT TERM GOAL #1   Title Pt will be independent with updated PD specific HEP--STGs due 10/01/16   Time 4   Period Weeks   Status Achieved  10/01/16     OT SHORT TERM GOAL #2   Title Pt will improve L hand coordination for ADLs as shown by improving time on 9-hole peg test by at least 6sec with LUE.   Baseline L-48.06sec   Time 4   Period Weeks   Status Achieved  10/01/16:  30.34sec     OT SHORT TERM GOAL #3   Title Pt will improve coordination/functional reaching for ADLs as shown by improving score on box and blocks test by at least 4 blocks   Baseline L-39 blocks   Time 4   Period Weeks   Status Achieved  10/01/16: 43 blocks     OT SHORT TERM GOAL #4   Title  Pt will be able to fasten/unfasten 3 button in less than 90sec (for improved ability to dress and improved bilateral hand coordination).   Baseline >61min/unable on tabletop   Time 4   Period Weeks   Status Achieved  10/01/16: 38.81sec      OT SHORT TERM GOAL #5   Title Pt will verbalize understanding of updated community resources prn.   Time 4   Period Weeks   Status Achieved  10/01/16           OT Long Term Goals - 10/01/16 1625      OT LONG TERM GOAL #1   Title Pt will verbalize understanding of adaptive strategies to incr ease with ADL's/IADLS's--LTGs due 5/00/93   Time 8   Period Weeks   Status New     OT LONG TERM GOAL #2   Title Pt will improve coordination for ADLs as shown by improving time on 9-hole peg test by at least 12sec with LUE.   Baseline 48.06sec   Time 8   Period Weeks   Status Achieved  10/01/16     OT LONG TERM GOAL #3   Title Pt will improve functional  reaching/coordination for ADLs as shown by improving score on box and blocks test by at least 8blocks with LUE.   Status New     OT LONG TERM GOAL #4   Title Pt will be able to fasten/unfasten 3 button in less than 70sec (for improved ability to dress and improved bilateral hand coordination).   Baseline unable, >20min   Time 8   Period Weeks   Status Achieved  10/01/16     OT LONG TERM GOAL #5   Title Assess standing functional reach and add goal prn   Time 8   Status Deferred  09/08/16:  WNL               Plan - 10/20/16 8182    Clinical Impression Statement Pt continues to demo difficulty with in-hand manipulation, particularly with LUE, but is progressing with reaching.   Rehab Potential Good   Current Impairments/barriers affecting progress: decr awareness, s/p DBS   OT Frequency 2x / week   OT Duration 8 weeks  +eval   OT Treatment/Interventions Self-care/ADL training;Moist Heat;DME and/or AE instruction;Patient/family education;Therapeutic exercises;Therapeutic activities;Neuromuscular education;Functional Mobility Training;Passive range of motion;Manual Therapy;Cognitive remediation/compensation;Visual/perceptual remediation/compensation;Energy conservation   Plan continue with coordination and complex functional movements   OT Home Exercise Plan Education provided:  Reviewed PWR! hands and coordination HEP, reviewed PWR! moves (up, rock, twist) in supine, quadraped, and prone   Consulted and Agree with Plan of Care Patient      Patient will benefit from skilled therapeutic intervention in order to improve the following deficits and impairments:  Decreased coordination, Difficulty walking, Impaired flexibility, Improper body mechanics, Decreased endurance, Decreased safety awareness, Improper spinal/pelvic alignment, Decreased knowledge of precautions, Decreased activity tolerance, Impaired tone, Decreased balance, Decreased knowledge of use of DME, Impaired UE functional  use, Decreased cognition, Decreased mobility, Impaired vision/preception, Impaired perceived functional ability  Visit Diagnosis: Other symptoms and signs involving the nervous system  Other symptoms and signs involving the musculoskeletal system  Other lack of coordination  Other abnormalities of gait and mobility  Abnormal posture  Attention and concentration deficit  Unsteadiness on feet    Problem List Patient Active Problem List   Diagnosis Date Noted  . Parkinson disease ([place])     [facility] 10/20/2016, 1:16 PM  [facility] [phone]  [place], [name], [address] Phone: [phone]   Fax:  [phone]  Name: Ronald Noble MRN: [id number] Date of Birth: Aug 18, 1945   Vianne Bulls, OTR/L [facility] [address] [place], [place]  [address] [phone] phone [phone] 10/20/16 1:16 PM

## 2016-10-20 NOTE — Patient Instructions (Signed)
Have conversations with your wife using your Dragon software!!

## 2016-10-21 NOTE — Therapy (Signed)
[facility] [address] [place], [name], [address] Phone: [phone]   Fax:  [phone]  Physical Therapy Treatment  Patient Details  Name: Ronald Noble MRN: [id number] Date of Birth: [birth year] Referring Provider: Rexene Alberts  Encounter Date: 10/20/2016      PT End of Session - 10/21/16 1649    Visit Number 10   Number of Visits 17   Date for PT Re-Evaluation 10/26/16   Authorization Type Medicare Primary, BCBS 2nd-GCODE every 10th visit   PT Start Time 1022   PT Stop Time 1100   PT Time Calculation (min) 38 min   Activity Tolerance Patient tolerated treatment well   Behavior During Therapy [facility] for tasks assessed/performed      Past Medical History:  Diagnosis Date  . BPH (benign prostatic hyperplasia)   . Parkinson disease ([place])   . Skin cancer of face    arms and back  . Sleep apnea    mild  . Spider bite    brown recluse/ left ankle/08/13    Past Surgical History:  Procedure Laterality Date  . BURR HOLE W/ STEREOTACTIC INSERTION OF DBS LEADS / INTRAOP MICROELECTRODE RECORDING    . COLONOSCOPY    . KNEE SURGERY     exploratory/left  . SKIN CANCER EXCISION  01/2014   face  . TRANSURETHRAL RESECTION OF PROSTATE     2008    There were no vitals filed for this visit.      Subjective Assessment - 10/20/16 1024    Subjective Not sleeping well lately-feel very fatigued.   Pertinent History s/p DBS, skin cancer, sleep apnea, BPH, TURP, orthostatic hypotension, syncopal episodes; arthritic changes in spine   Patient Stated Goals Pt's goals for therapy are to improve strength, posture, and agility with movement.   Currently in Pain? No/denies                         [facility] Adult PT Treatment/Exercise - 10/21/16 1641      Transfers   Transfers Sit to Stand;Stand to Sit   Sit to Stand 7: Independent   Five time sit to stand comments  7.5   Stand to Sit 7: Independent     Ambulation/Gait   Ambulation/Gait Yes   Ambulation/Gait Assistance 7: Independent   Ambulation/Gait Assistance Details Cues provided on outdoor, unlevel surfaces for slowed pace of gait and improved foot clearance   Ambulation Distance (Feet) 800 Feet  outdoors, then 400 ft indoors   Assistive device None   Gait Pattern Step-through pattern;Decreased step length - right;Decreased step length - left;Decreased trunk rotation;Narrow base of support   Ambulation Surface Level;Unlevel;Outdoor;Indoor;Grass  Negotiated sand, gravel, and mulch areas   Gait velocity 8 sec = 4.1 ft/sec     Standardized Balance Assessment   Standardized Balance Assessment Timed Up and Go Test     Timed Up and Go Test   TUG Normal TUG   Normal TUG (seconds) 10.59     High Level Balance   High Level Balance Comments MiniBESTest score 26/28, improved from 18/28     Self-Care   Self-Care Other Self-Care Comments   Other Self-Care Comments  Reviewed fall prevention education provided last visit, especially in light of pt's recent beach trip (which he reports he had no trouble).  Pt reports increased fatigue and difficulty sleeping recently.  Recommended pt follow-up with neurologist regarding sleep issues.  Discussed POC, improvements on objective functional mobility measures  and pt's plans to discharge next PT visit.       MiniBESTest rise to toes-instability with forward lean (1), stand on one leg 3 sec at best on LLE (1); all other scores on test today (2)           PT Short Term Goals - 09/24/16 2725      PT SHORT TERM GOAL #1   Title Pt will be independent with HEP for improved balance, posture, functional mobility.  TARGET 09/26/16   Baseline Needs minimal cues for technique   Time 4   Period Weeks   Status Partially Met     PT SHORT TERM GOAL #2   Title Pt will perform at least 8 of 10 reps of sit<>stand transfers from <18 surfaces, independently, no posterior lean, for improved transfer  efficiency and safety.   Time 4   Period Weeks   Status Achieved     PT SHORT TERM GOAL #3   Title Pt will verbalize at least 3 means to reduce pain in low back with functional activities.   Time 4   Period Weeks   Status Achieved     PT SHORT TERM GOAL #4   Title Pt will improve posterior push and release test to less than 2 steps to independently recover balance in posterior direction.   Time 4   Period Weeks   Status Achieved           PT Long Term Goals - 10/20/16 1050      PT LONG TERM GOAL #1   Title Pt will verbalize understanding of fall prevention in home environment.  TARGET 10/26/16   Time 8   Period Weeks   Status Achieved     PT LONG TERM GOAL #2   Title Pt will improve MiniBESTest score to at least 22/28 for decreased fall risk.   Time 8   Period Weeks   Status Achieved     PT LONG TERM GOAL #3   Title Pt will verbalize understanding of techniques to reduce festination/freezing of gait.   Time 8   Period Weeks   Status New     PT LONG TERM GOAL #4   Title Pt will verbalize plans for continued community fitness upon D/C from PT.   Time 8   Period Weeks   Status New     PT LONG TERM GOAL #5   Title Pt will ambulate at least 1000 ft, indoor and outdoor surfaces, independently, no LOB, for improved community gait.   Time 8   Period Weeks   Status Achieved               Plan - 10/21/16 1650    Clinical Impression Statement Pt has met LTG 1, 2, and 5.  Pt has demonstrated improvement on functional moiblity measures, particularly MiniBESTest in PT session today.  Pt also continues to appear to have more relaxed arm swing with gait.  Pt does still report changes in functional mobility with off-times in medication.  Pt will benefit from one additional session to address tips to reduce freezing with gait during off-times, and education on community fitness plans in regards to his PD.   Rehab Potential Good   PT Frequency 2x / week   PT Duration 8  weeks  plus eval   PT Treatment/Interventions ADLs/Self Care Home Management;Functional mobility training;Gait training;Patient/family education;Therapeutic activities;Therapeutic exercise;Balance training;Neuromuscular re-education   PT Next Visit Plan Tips to reduce freezing (off-times with meds), community  fitness options and plan for discharge.   Consulted and Agree with Plan of Care Patient      Patient will benefit from skilled therapeutic intervention in order to improve the following deficits and impairments:  Abnormal gait, Decreased balance, Decreased mobility, Decreased strength, Difficulty walking, Postural dysfunction, Pain  Visit Diagnosis: Other abnormalities of gait and mobility  Unsteadiness on feet       G-Codes - 11/19/16 1653    Functional Assessment Tool Used (Outpatient Only) 5x sit<>stand 7.5 sec, MiniBEStest 26/28   Functional Limitation Mobility: Walking and moving around   Mobility: Walking and Moving Around Current Status [phone]) At least 1 percent but less than 20 percent impaired, limited or restricted   Mobility: Walking and Moving Around Goal Status [phone]) At least 1 percent but less than 20 percent impaired, limited or restricted      Problem List Patient Active Problem List   Diagnosis Date Noted  . Parkinson disease ([place])     Miah Boye W. 10/21/2016, 4:55 PM  Frazier Butt., PT   [facility] [address] [place], [name], [address] Phone: [phone]   Fax:  [phone]  Name: Ronald Noble MRN: [id number] Date of Birth: [birth year]   Physical Therapy Progress Note  Dates of Reporting Period: 08/27/16 to November 19, 2016  Objective Reports of Subjective Statement: Pt able to negotiate vacation, beach surfaces without fall.  Objective Measurements: MiniBESTest 26/28 (improved from 18/28), gait velocity 4.1 ft/sec, 5x sit<>stand 7.5 sec  Goal Update: Pt has met LTG 1, 2,  5.  See above for short term goals.  Pt on target to meet remaining LTGs  Plan: Continue POC for 1 more visit to check remaining LTGs and make sure pt has optimal community fitness plan.   Reason Skilled Services are Required: To complete education and training on tips/strategies to reduce freezing of gait and turns during off-times of medication.  Mady Haagensen, PT 10/21/16 4:59 PM Phone: [phone] Fax: [phone]

## 2016-10-22 ENCOUNTER — Ambulatory Visit: Payer: Medicare Other | Admitting: Occupational Therapy

## 2016-10-22 ENCOUNTER — Ambulatory Visit: Payer: Medicare Other | Admitting: Speech Pathology

## 2016-10-22 ENCOUNTER — Ambulatory Visit: Payer: Medicare Other | Admitting: Physical Therapy

## 2016-10-22 DIAGNOSIS — R29898 Other symptoms and signs involving the musculoskeletal system: Secondary | ICD-10-CM | POA: Diagnosis not present

## 2016-10-22 DIAGNOSIS — R29818 Other symptoms and signs involving the nervous system: Secondary | ICD-10-CM

## 2016-10-22 DIAGNOSIS — R41840 Attention and concentration deficit: Secondary | ICD-10-CM

## 2016-10-22 DIAGNOSIS — R293 Abnormal posture: Secondary | ICD-10-CM

## 2016-10-22 DIAGNOSIS — R278 Other lack of coordination: Secondary | ICD-10-CM

## 2016-10-22 DIAGNOSIS — R471 Dysarthria and anarthria: Secondary | ICD-10-CM | POA: Diagnosis not present

## 2016-10-22 DIAGNOSIS — R41841 Cognitive communication deficit: Secondary | ICD-10-CM | POA: Diagnosis not present

## 2016-10-22 DIAGNOSIS — R2681 Unsteadiness on feet: Secondary | ICD-10-CM

## 2016-10-22 DIAGNOSIS — R2689 Other abnormalities of gait and mobility: Secondary | ICD-10-CM

## 2016-10-22 NOTE — Therapy (Signed)
[facility] [address] [place], [name], [address] Phone: [phone]   Fax:  [phone]  Speech Language Pathology Treatment  Patient Details  Name: Ronald Noble MRN: [id number] Date of Birth: Mar 26, 1946 Referring Provider: Star Age, MD  Encounter Date: 10/22/2016      End of Session - 10/22/16 1445    Visit Number 10   Number of Visits 17   Date for SLP Re-Evaluation 10/30/16   SLP Start Time 57   SLP Stop Time  1443   SLP Time Calculation (min) 41 min   Activity Tolerance Patient tolerated treatment well      Past Medical History:  Diagnosis Date  . BPH (benign prostatic hyperplasia)   . Parkinson disease ([place])   . Skin cancer of face    arms and back  . Sleep apnea    mild  . Spider bite    brown recluse/ left ankle/08/13    Past Surgical History:  Procedure Laterality Date  . BURR HOLE W/ STEREOTACTIC INSERTION OF DBS LEADS / INTRAOP MICROELECTRODE RECORDING    . COLONOSCOPY    . KNEE SURGERY     exploratory/left  . SKIN CANCER EXCISION  01/2014   face  . TRANSURETHRAL RESECTION OF PROSTATE     2008    There were no vitals filed for this visit.      Subjective Assessment - 10/22/16 1408    Subjective "As long as I think about it, it's OK"               ADULT SLP TREATMENT - 10/22/16 1408      General Information   Behavior/Cognition Alert;Cooperative;Pleasant mood     Treatment Provided   Treatment provided Cognitive-Linquistic     Cognitive-Linquistic Treatment   Treatment focused on Dysarthria   Skilled Treatment Speaking volume recalibrated with loud /a/ average of 93dB.  Structured speech tasks generating sentences with multisyllabic words focusing on over articulation and slow rate with occasional min cues for carryover of compensations. Pt required cues to reduce rate during      Assessment / Recommendations / Plan   Plan Continue with current plan  of care     Progression Toward Goals   Progression toward goals Progressing toward goals            SLP Short Term Goals - 10/22/16 1445      SLP SHORT TERM GOAL #1   Title Pt will average 93dB on loud /a/ over 3 sessions with rare min A   Status Partially Met     SLP SHORT TERM GOAL #2   Title Pt will average 72dB on structured speech tasks with rare min A   Status Achieved     SLP SHORT TERM GOAL #3   Title Pt will maintain average low 70s dB over 8 minute simple conversation with rare min A   Status Not Met     SLP SHORT TERM GOAL #4   Title pt will maintain speech intelligibility of 95%+ for 8 minutes simple conversation over three sesssions   Status Partially Met     SLP SHORT TERM GOAL #5   Title pt will demo knowledge of 4 different memory compenstions   Status Deferred          SLP Long Term Goals - 10/22/16 1445      SLP LONG TERM GOAL #1   Title Pt will maintain 70dB over 15 minute mod complex conversation with  rare min A over three therapy sessions   Baseline 09/21/16, 10-20-16   Time 3   Period Weeks   Status On-going     SLP LONG TERM GOAL #2   Title Pt will maintain 95%+ speech intelligibility in noisy environment outside of tx room over 12 minutes with rare min A, over three sessions   Time 3   Period Weeks   Status On-going     SLP LONG TERM GOAL #3   Title pt will demo one memory compensation in 3 sessions    Time 3   Period Weeks   Status On-going     SLP LONG TERM GOAL #4   Title pt will demo anticipatory awareness to independently double check his work in [employer] complex cognitive linguistic tasks in 7 sessions   Time 3   Period Weeks   Status On-going     SLP LONG TERM GOAL #5   Title in detailed cognitive linguistic tasks pt will be 100% accurate with modified independence (double-checking his work)   Time 3   Period Weeks   Status On-going     Speech Therapy Progress Note  Dates of Reporting Period: 08/27/16 to  11/05/2016  Objective Reports of Subjective Statement: Pt reports his speech is getting better, however his wife does ask him to repeat himself occasionally  Objective Measurements: See goals and treatment session - average dB in conversation upper 60's, with occasional min cueing required for reduced rate and over articulation  Goal Update: Continue current goals  Plan: Continue POC  Reason Skilled Services are Required: Pt continues to require skilled ST to habitualize carryover of compensations to improve intelligibility in a variety of settings.       Plan - 2016-11-05 1445    Clinical Impression Statement Patient continues to display reduced awareness of speech patterns of rushes of speech, reduced articulatory precision. SLP used spaced cueing to incr pt's awareness. SKilled ST is necessary for recalibration of speech musculature for production of clear, intelligible speech and to improve pt's cognitive linguistic ability.   Treatment/Interventions Compensatory strategies;Patient/family education;Multimodal communcation approach;Internal/external aids;SLP instruction and feedback;Functional tasks;Cueing hierarchy;Cognitive reorganization   Potential to Achieve Goals Fair   Potential Considerations Severity of impairments   Consulted and Agree with Plan of Care Patient      Patient will benefit from skilled therapeutic intervention in order to improve the following deficits and impairments:   Dysarthria and anarthria  Cognitive communication deficit      G-Codes - 11/05/16 1446    Functional Assessment Tool Used noms (approx 30% imaired)   Functional Limitations Motor speech   Motor Speech Current Status [phone]) At least 20 percent but less than 40 percent impaired, limited or restricted   Motor Speech Goal Status ([id number]) At least 1 percent but less than 20 percent impaired, limited or restricted      Problem List Patient Active Problem List   Diagnosis Date Noted  .  Parkinson disease ([place])     Lovvorn, Annye Rusk MS, CCC-SLP 05-Nov-2016, 2:46 PM  [facility] [address] [place], [name], [address] Phone: [phone]   Fax:  [phone]   Name: Ronald Noble MRN: [id number] Date of Birth: [birth year]

## 2016-10-22 NOTE — Therapy (Signed)
[facility] [address] [place], [name], [address] Phone: [phone]   Fax:  [phone]  Occupational Therapy Treatment  Patient Details  Name: Ronald Noble MRN: [id number] Date of Birth: 1945/08/07 Referring Provider: Dr. Star Age  Encounter Date: 10/22/2016      OT End of Session - 10/22/16 1326    Visit Number 9   Number of Visits 17   Date for OT Re-Evaluation 10/31/16   Authorization Type Medicare/BCBS - G Code needed   Authorization - Visit Number 9   Authorization - Number of Visits 10   OT Start Time 3875   OT Stop Time 1400   OT Time Calculation (min) 39 min   Activity Tolerance Patient tolerated treatment well   Behavior During Therapy [facility] for tasks assessed/performed      Past Medical History:  Diagnosis Date  . BPH (benign prostatic hyperplasia)   . Parkinson disease ([place])   . Skin cancer of face    arms and back  . Sleep apnea    mild  . Spider bite    brown recluse/ left ankle/08/13    Past Surgical History:  Procedure Laterality Date  . BURR HOLE W/ STEREOTACTIC INSERTION OF DBS LEADS / INTRAOP MICROELECTRODE RECORDING    . COLONOSCOPY    . KNEE SURGERY     exploratory/left  . SKIN CANCER EXCISION  01/2014   face  . TRANSURETHRAL RESECTION OF PROSTATE     2008    There were no vitals filed for this visit.      Subjective Assessment - 10/22/16 1325    Subjective  middle of the day is harder   Pertinent History PD since 2008, s/p DBS 06/2014, skin cancer, sleep apnea, BPH, TURP, orthostatic hypotension, syncopal episodes; arthritic changes in spine   Limitations DBS, falls, orthostatic hypotension   Patient Stated Goals improve finger dexterity   Currently in Pain? No/denies          Neuro re-ed:  PWR! Moves (basic 4) in prone and PWR! Up, rock, twist in supine x 10-20 each with min cues For incr movement amplitude/technique.  In sitting, functional  reaching in ER/overhead with trunk rotation/wt. Shift To place large pegs in vertical pegboard with set-up for large amplitude movements and min cues for PWR! Hand  with manipulating 3 in hand for incr coordination with min difficulty each UE.  Completing Purdue pegboard with each hand with min difficulty with coordination and intermittent cueing for PWR! Hand use (pt also performed spontaneously at times).                               OT Short Term Goals - 10/01/16 1548      OT SHORT TERM GOAL #1   Title Pt will be independent with updated PD specific HEP--STGs due 10/01/16   Time 4   Period Weeks   Status Achieved  10/01/16     OT SHORT TERM GOAL #2   Title Pt will improve L hand coordination for ADLs as shown by improving time on 9-hole peg test by at least 6sec with LUE.   Baseline L-48.06sec   Time 4   Period Weeks   Status Achieved  10/01/16:  30.34sec     OT SHORT TERM GOAL #3   Title Pt will improve coordination/functional reaching for ADLs as shown by improving score on box and blocks test by at least  4 blocks   Baseline L-39 blocks   Time 4   Period Weeks   Status Achieved  10/01/16: 43 blocks     OT SHORT TERM GOAL #4   Title Pt will be able to fasten/unfasten 3 button in less than 90sec (for improved ability to dress and improved bilateral hand coordination).   Baseline >74min/unable on tabletop   Time 4   Period Weeks   Status Achieved  10/01/16: 38.81sec      OT SHORT TERM GOAL #5   Title Pt will verbalize understanding of updated community resources prn.   Time 4   Period Weeks   Status Achieved  10/01/16           OT Long Term Goals - 10/22/16 1413      OT LONG TERM GOAL #1   Title Pt will verbalize understanding of adaptive strategies to incr ease with ADL's/IADLS's--LTGs due 6/60/63   Time 8   Period Weeks   Status On-going     OT LONG TERM GOAL #2   Title Pt will improve coordination for ADLs as shown by improving time  on 9-hole peg test by at least 12sec with LUE.   Baseline 48.06sec   Time 8   Period Weeks   Status Achieved  10/01/16     OT LONG TERM GOAL #3   Title Pt will improve functional reaching/coordination for ADLs as shown by improving score on box and blocks test by at least 8blocks with LUE.   Status New     OT LONG TERM GOAL #4   Title Pt will be able to fasten/unfasten 3 button in less than 70sec (for improved ability to dress and improved bilateral hand coordination).   Baseline unable, >52min   Time 8   Period Weeks   Status Achieved  10/01/16     OT LONG TERM GOAL #5   Title Assess standing functional reach and add goal prn   Time 8   Status Deferred  09/08/16:  WNL               Plan - 10/22/16 1327    Clinical Impression Statement Pt is progressing towards goals with improving functional reach.     Rehab Potential Good   Current Impairments/barriers affecting progress: decr awareness, s/p DBS   OT Frequency 2x / week   OT Duration 8 weeks  +eval   OT Treatment/Interventions Self-care/ADL training;Moist Heat;DME and/or AE instruction;Patient/family education;Therapeutic exercises;Therapeutic activities;Neuromuscular education;Functional Mobility Training;Passive range of motion;Manual Therapy;Cognitive remediation/compensation;Visual/perceptual remediation/compensation;Energy conservation   Plan check goals and d/c next week, G-code next session   OT Home Exercise Plan Education provided:  Reviewed PWR! hands and coordination HEP, reviewed PWR! moves (up, rock, twist) in supine, quadraped, and prone   Consulted and Agree with Plan of Care Patient      Patient will benefit from skilled therapeutic intervention in order to improve the following deficits and impairments:  Decreased coordination, Difficulty walking, Impaired flexibility, Improper body mechanics, Decreased endurance, Decreased safety awareness, Improper spinal/pelvic alignment, Decreased knowledge of  precautions, Decreased activity tolerance, Impaired tone, Decreased balance, Decreased knowledge of use of DME, Impaired UE functional use, Decreased cognition, Decreased mobility, Impaired vision/preception, Impaired perceived functional ability  Visit Diagnosis: Other symptoms and signs involving the nervous system  Other symptoms and signs involving the musculoskeletal system  Other lack of coordination  Other abnormalities of gait and mobility  Abnormal posture  Attention and concentration deficit  Unsteadiness on feet  Problem List Patient Active Problem List   Diagnosis Date Noted  . Parkinson disease [facility])     [facility] 10/22/2016, 2:58 PM  [facility] [address] [place], [name], [address] Phone: [phone]   Fax:  [phone]  Name: Moustapha Tooker MRN: [id number] Date of Birth: Feb 11, 1946   Vianne Bulls, OTR/L [facility] [address] [place], [place]  [address] [phone] phone [phone] 10/22/16 2:58 PM

## 2016-10-26 ENCOUNTER — Ambulatory Visit: Payer: Medicare Other | Admitting: Occupational Therapy

## 2016-10-26 ENCOUNTER — Ambulatory Visit: Payer: Medicare Other | Admitting: Physical Therapy

## 2016-10-26 ENCOUNTER — Ambulatory Visit: Payer: Medicare Other | Admitting: Speech Pathology

## 2016-10-26 DIAGNOSIS — R41841 Cognitive communication deficit: Secondary | ICD-10-CM | POA: Diagnosis not present

## 2016-10-26 DIAGNOSIS — R293 Abnormal posture: Secondary | ICD-10-CM | POA: Diagnosis not present

## 2016-10-26 DIAGNOSIS — R29818 Other symptoms and signs involving the nervous system: Secondary | ICD-10-CM

## 2016-10-26 DIAGNOSIS — R278 Other lack of coordination: Secondary | ICD-10-CM

## 2016-10-26 DIAGNOSIS — R29898 Other symptoms and signs involving the musculoskeletal system: Secondary | ICD-10-CM | POA: Diagnosis not present

## 2016-10-26 DIAGNOSIS — R471 Dysarthria and anarthria: Secondary | ICD-10-CM

## 2016-10-26 DIAGNOSIS — R2689 Other abnormalities of gait and mobility: Secondary | ICD-10-CM

## 2016-10-26 NOTE — Patient Instructions (Signed)

## 2016-10-26 NOTE — Therapy (Signed)
[place] [address] [place], [name], [address] Phone: [phone]   Fax:  [phone]  Occupational Therapy Treatment  Patient Details  Name: Ronald Noble MRN: [id number] Date of Birth: [birth year] Referring Provider: Dr. Star Age  Encounter Date: 10/26/2016      OT End of Session - 10/26/16 1451    Visit Number 10   Number of Visits 17   Date for OT Re-Evaluation 10/31/16   Authorization Type Medicare/BCBS - G Code needed   Authorization - Visit Number 10   Authorization - Number of Visits 10   OT Start Time 1450   OT Stop Time 1530   OT Time Calculation (min) 40 min   Activity Tolerance Patient tolerated treatment well   Behavior During Therapy [facility] for tasks assessed/performed      Past Medical History:  Diagnosis Date  . BPH (benign prostatic hyperplasia)   . Parkinson disease ([place])   . Skin cancer of face    arms and back  . Sleep apnea    mild  . Spider bite    brown recluse/ left ankle/08/13    Past Surgical History:  Procedure Laterality Date  . BURR HOLE W/ STEREOTACTIC INSERTION OF DBS LEADS / INTRAOP MICROELECTRODE RECORDING    . COLONOSCOPY    . KNEE SURGERY     exploratory/left  . SKIN CANCER EXCISION  01/2014   face  . TRANSURETHRAL RESECTION OF PROSTATE     2008    There were no vitals filed for this visit.      Subjective Assessment - 10/26/16 1450    Subjective  pt reports that he did some yardwork and swimming, but that R shoulder is bothering him.  Pt feels that he is doing better overall.   Pertinent History PD since 2008, s/p DBS 06/2014, skin cancer, sleep apnea, BPH, TURP, orthostatic hypotension, syncopal episodes; arthritic changes in spine   Limitations DBS, falls, orthostatic hypotension   Patient Stated Goals improve finger dexterity   Currently in Pain? Yes   Pain Score 3    Pain Location --  shoulder   Pain Orientation Right   Pain Descriptors /  Indicators Aching;Dull   Pain Type Chronic pain   Pain Onset In the past 7 days   Pain Frequency Intermittent   Aggravating Factors  certain movements    Pain Relieving Factors rest        In supine, scapular depression bilaterally with min cueing.   PWR! Moves in supine (up, rock) with min cueing for incr movement amplitude/shoulder positioning.  Then PWR! Moves in sitting (up, rock, twist) with min cueing for incr movement amplitude and shoulder positioning.  In sitting, functional reaching in ER/overhead with trunk rotation/wt. Shift To place medium pegs in vertical pegboard with set-up for large amplitude movements and min cues for PWR! Hand  with manipulating 3 in hand for incr coordination with min difficulty each UE.  Pt cued to use vision for coordination.  Discussed positioning of shoulders/posture with yardwork.  Pt verbalized understanding.  Placing grooved pegs in pegboard with each hand with mod difficulty with L hand and min difficulty with R hand.  Checked box and blocks test--see goals section below.                        OT Short Term Goals - 10/01/16 1548      OT SHORT TERM GOAL #1   Title Pt will  be independent with updated PD specific HEP--STGs due 10/01/16   Time 4   Period Weeks   Status Achieved  10/01/16     OT SHORT TERM GOAL #2   Title Pt will improve L hand coordination for ADLs as shown by improving time on 9-hole peg test by at least 6sec with LUE.   Baseline L-48.06sec   Time 4   Period Weeks   Status Achieved  10/01/16:  30.34sec     OT SHORT TERM GOAL #3   Title Pt will improve coordination/functional reaching for ADLs as shown by improving score on box and blocks test by at least 4 blocks   Baseline L-39 blocks   Time 4   Period Weeks   Status Achieved  10/01/16: 43 blocks     OT SHORT TERM GOAL #4   Title Pt will be able to fasten/unfasten 3 button in less than 90sec (for improved ability to dress and improved  bilateral hand coordination).   Baseline >34min/unable on tabletop   Time 4   Period Weeks   Status Achieved  10/01/16: 38.81sec      OT SHORT TERM GOAL #5   Title Pt will verbalize understanding of updated community resources prn.   Time 4   Period Weeks   Status Achieved  10/01/16           OT Long Term Goals - 10/26/16 1500      OT LONG TERM GOAL #1   Title Pt will verbalize understanding of adaptive strategies to incr ease with ADL's/IADLS's--LTGs due 5/91/63   Time 8   Period Weeks   Status On-going  10/26/16:  instructed, but would benefit from review     OT LONG TERM GOAL #2   Title Pt will improve coordination for ADLs as shown by improving time on 9-hole peg test by at least 12sec with LUE.   Baseline 48.06sec   Time 8   Period Weeks   Status Achieved  10/01/16     OT LONG TERM GOAL #3   Title Pt will improve functional reaching/coordination for ADLs as shown by improving score on box and blocks test by at least 8blocks with LUE.   Baseline 39   Status Achieved  10/26/16:  R-50 blocks, L-50blocks     OT LONG TERM GOAL #4   Title Pt will be able to fasten/unfasten 3 button in less than 70sec (for improved ability to dress and improved bilateral hand coordination).   Baseline unable, >63min   Time 8   Period Weeks   Status Achieved  10/01/16     OT LONG TERM GOAL #5   Title Assess standing functional reach and add goal prn   Time 8   Status Deferred  09/08/16:  WNL               Plan - 10/26/16 1452    Clinical Impression Statement Pt is progressing towards remaining goals with improving in-hand manipulation and functional reach.  However, pt would benefit from additional OT to check on recent R shoulder pain and review pt education.   Rehab Potential Good   Current Impairments/barriers affecting progress: decr awareness, s/p DBS   OT Frequency 2x / week   OT Duration 8 weeks  +eval   OT Treatment/Interventions Self-care/ADL training;Moist Heat;DME  and/or AE instruction;Patient/family education;Therapeutic exercises;Therapeutic activities;Neuromuscular education;Functional Mobility Training;Passive range of motion;Manual Therapy;Cognitive remediation/compensation;Visual/perceptual remediation/compensation;Energy conservation   Plan pt would benefit from 1 additional OT visit to monitor R shoulder  pain/review pt education.; check remaining goal and d/c next session; schedule screen in 6-52months   OT Home Exercise Plan Education provided:  Reviewed PWR! hands and coordination HEP, reviewed PWR! moves (up, rock, twist) in supine, quadraped, and prone   Consulted and Agree with Plan of Care Patient      Patient will benefit from skilled therapeutic intervention in order to improve the following deficits and impairments:  Decreased coordination, Difficulty walking, Impaired flexibility, Improper body mechanics, Decreased endurance, Decreased safety awareness, Improper spinal/pelvic alignment, Decreased knowledge of precautions, Decreased activity tolerance, Impaired tone, Decreased balance, Decreased knowledge of use of DME, Impaired UE functional use, Decreased cognition, Decreased mobility, Impaired vision/preception, Impaired perceived functional ability  Visit Diagnosis: Other symptoms and signs involving the nervous system  Other symptoms and signs involving the musculoskeletal system  Other lack of coordination  Abnormal posture  Other abnormalities of gait and mobility      G-Codes - Nov 05, 2016 1511    Functional Assessment Tool Used (Outpatient only) L 9-hole peg test 30.34 sec; L Box and blocks test 50 blocks; unable to fasten/unfasten 3 buttons on table 38.81sec   Functional Limitation Carrying, moving and handling objects   Carrying, Moving and Handling Objects Current Status [phone]) At least 1 percent but less than 20 percent impaired, limited or restricted   Carrying, Moving and Handling Objects Goal Status ([id number]) At least 1  percent but less than 20 percent impaired, limited or restricted      Problem List Patient Active Problem List   Diagnosis Date Noted  . Parkinson disease [facility])    Occupational Therapy Progress Note  Dates of Reporting Period: 09/01/16 to 11/05/16  Objective Reports of Subjective Statement: see above   Objective Measurements: see above  Goal Update: see above  Plan: see above  Reason Skilled Services are Required: pt would benefit from 1 additional OT visit to monitor R shoulder pain/review pt education.    [facility] 11/05/16, 3:28 PM  [place] [address] [place], [name], [address] Phone: [phone]   Fax:  [phone]  Name: Ronald Noble MRN: [id number] Date of Birth: [birth year]   Vianne Bulls, OTR/L [facility] [address] [place], [place]  [address] [phone] phone [phone] 11-05-16 3:28 PM

## 2016-10-27 NOTE — Therapy (Signed)
[facility] [address] [place], [place], [address] Phone: [phone]   Fax:  [phone]  Physical Therapy Treatment  Patient Details  Name: Ronald Noble MRN: [id number] Date of Birth: [birth year] Referring Provider: Frances Furbish  Encounter Date: 10/26/2016      PT End of Session - 10/27/16 1506    Visit Number 11   Number of Visits 17   Date for PT Re-Evaluation 10/26/16   Authorization Type Medicare Primary, BCBS 2nd-GCODE every 10th visit   PT Start Time 1410  Pt arrives late   PT Stop Time 1441   PT Time Calculation (min) 31 min   Activity Tolerance Patient tolerated treatment well   Behavior During Therapy [facility] for tasks assessed/performed      Past Medical History:  Diagnosis Date  . BPH (benign prostatic hyperplasia)   . Parkinson disease ([facility])   . Skin cancer of face    arms and back  . Sleep apnea    mild  . Spider bite    brown recluse/ left ankle/08/13    Past Surgical History:  Procedure Laterality Date  . BURR HOLE W/ STEREOTACTIC INSERTION OF DBS LEADS / INTRAOP MICROELECTRODE RECORDING    . COLONOSCOPY    . KNEE SURGERY     exploratory/left  . SKIN CANCER EXCISION  01/2014   face  . TRANSURETHRAL RESECTION OF PROSTATE     2008    There were no vitals filed for this visit.      Subjective Assessment - 10/26/16 1412    Subjective Nothing new, just sometimes have pain in R shoulder.   Pertinent History s/p DBS, skin cancer, sleep apnea, BPH, TURP, orthostatic hypotension, syncopal episodes; arthritic changes in spine   Patient Stated Goals Pt's goals for therapy are to improve strength, posture, and agility with movement.   Currently in Pain? Yes   Pain Score 3    Pain Location Shoulder   Pain Orientation Right   Pain Descriptors / Indicators Aching   Pain Type Chronic pain   Pain Frequency Intermittent   Aggravating Factors  certain movement aggravate   Pain Relieving  Factors avoiding certain movements                         [facility] Adult PT Treatment/Exercise - 10/26/16 1415      Ambulation/Gait   Ambulation/Gait Yes   Ambulation/Gait Assistance 7: Independent   Ambulation Distance (Feet) 400 Feet  then 200, then 150   Assistive device None   Gait Pattern Step-through pattern;Decreased step length - right;Decreased step length - left;Decreased trunk rotation;Narrow base of support   Ambulation Surface Level;Indoor   Pre-Gait Activities Marching with reciprocal arm lifts, forward lunge walking with reciprocal arm swing.  Gait with starts/stops (cues for staggered stance weight shift forward and back for improved balance and step length when re-starting gait.   Gait Comments Negotiating doorways, opening and closing doors, with cues for widened BOS and foot placement for weightshifting to avoid getting off balance.  Performed 4-5 reps      Self-Care   Self-Care Other Self-Care Comments   Other Self-Care Comments  Discussed off-times, including freezing/festinating, posture; including tips to reduce festination/freezing with gait;  discussed upright posture and use of visual targets, as well as wide BOS and weightshifting to lessen severity of freezing episode.  Discussed current fitness routine, including importance of consistent performance of therapy exercises and walking.  Discussed/recommended 15-20 minutes  of walking for exercise 3-5 times per week, to reinforce posture, arm swing and step length/foot clearance.  Discussed POC, progress towards goals and plans for d/c this visit.  Will recommend return PT screen in 6-9 months.                PT Education - 10/27/16 1505    Education provided Yes   Education Details Tips to reduce freezing/festinating episodes with gait.  Provided info PWR! Moves community PD exercise class   Person(s) Educated Patient   Methods Explanation;Demonstration;Handout   Comprehension Verbalized  understanding;Returned demonstration          PT Short Term Goals - 09/24/16 0833      PT SHORT TERM GOAL #1   Title Pt will be independent with HEP for improved balance, posture, functional mobility.  TARGET 09/26/16   Baseline Needs minimal cues for technique   Time 4   Period Weeks   Status Partially Met     PT SHORT TERM GOAL #2   Title Pt will perform at least 8 of 10 reps of sit<>stand transfers from <18 surfaces, independently, no posterior lean, for improved transfer efficiency and safety.   Time 4   Period Weeks   Status Achieved     PT SHORT TERM GOAL #3   Title Pt will verbalize at least 3 means to reduce pain in low back with functional activities.   Time 4   Period Weeks   Status Achieved     PT SHORT TERM GOAL #4   Title Pt will improve posterior push and release test to less than 2 steps to independently recover balance in posterior direction.   Time 4   Period Weeks   Status Achieved           PT Long Term Goals - 10/27/16 1506      PT LONG TERM GOAL #1   Title Pt will verbalize understanding of fall prevention in home environment.  TARGET 10/26/16   Time 8   Period Weeks   Status Achieved     PT LONG TERM GOAL #2   Title Pt will improve MiniBESTest score to at least 22/28 for decreased fall risk.   Time 8   Period Weeks   Status Achieved     PT LONG TERM GOAL #3   Title Pt will verbalize understanding of techniques to reduce festination/freezing of gait.   Time 8   Period Weeks   Status Achieved     PT LONG TERM GOAL #4   Title Pt will verbalize plans for continued community fitness upon D/C from PT.   Time 8   Period Weeks   Status Achieved     PT LONG TERM GOAL #5   Title Pt will ambulate at least 1000 ft, indoor and outdoor surfaces, independently, no LOB, for improved community gait.   Time 8   Period Weeks   Status Achieved               Plan - 10/27/16 1507    Clinical Impression Statement Pt has met all LTGs.  Pt  demonstrates good ability to follow strategies for techniques to reduce freezing episodes with gait during off-times.  Pt is appropriate for discharge this visit.   Rehab Potential Good   PT Frequency 2x / week   PT Duration 8 weeks  plus eval   PT Treatment/Interventions ADLs/Self Care Home Management;Functional mobility training;Gait training;Patient/family education;Therapeutic activities;Therapeutic exercise;Balance training;Neuromuscular re-education   PT Next  Visit Plan Discharge PT this visit.  Recommend follow-up PD screen in 6-9 months.   Consulted and Agree with Plan of Care Patient      Patient will benefit from skilled therapeutic intervention in order to improve the following deficits and impairments:  Abnormal gait, Decreased balance, Decreased mobility, Decreased strength, Difficulty walking, Postural dysfunction, Pain  Visit Diagnosis: Other abnormalities of gait and mobility  Other symptoms and signs involving the nervous system       G-Codes - 11-22-2016 1509    Functional Assessment Tool Used (Outpatient Only) 5x sit<>stand 7.5 sec, MiniBEStest 26/28, gt velocity 4.1 ft/sec, TUG 10.59 sec   Functional Limitation Mobility: Walking and moving around   Mobility: Walking and Moving Around Goal Status [phone]) At least 1 percent but less than 20 percent impaired, limited or restricted   Mobility: Walking and Moving Around Discharge Status [phone]) At least 1 percent but less than 20 percent impaired, limited or restricted      Problem List Patient Active Problem List   Diagnosis Date Noted  . Parkinson disease ([place])     [name],[name] 11/22/2016, 3:11 PM  Frazier Butt., PT   [place] [address] [place], [name], [address] Phone: [phone]   Fax:  [phone]  Name: Ronald Noble MRN: [id number] Date of Birth: Oct 15, 1945   PHYSICAL THERAPY DISCHARGE SUMMARY  Visits from Start of  Care: 11  Current functional level related to goals / functional outcomes: See LTGs noted above.  Pt has met 5 of 5 long term goals.   Remaining deficits: Posture, balance, freezing with off-times of medications   Education / Equipment: Pt educated in HEP, fall prevention, community fitness options.  Plan: Patient agrees to discharge.  Patient goals were met. Patient is being discharged due to meeting the stated rehab goals.  ?????Recommend return PD screen in 6-9 months due to progressive nature of disease process.    Mady Haagensen, PT November 22, 2016 3:14 PM Phone: [phone] Fax: [phone]

## 2016-10-28 DIAGNOSIS — G249 Dystonia, unspecified: Secondary | ICD-10-CM | POA: Diagnosis not present

## 2016-10-28 DIAGNOSIS — G4733 Obstructive sleep apnea (adult) (pediatric): Secondary | ICD-10-CM | POA: Diagnosis not present

## 2016-10-28 DIAGNOSIS — Z9689 Presence of other specified functional implants: Secondary | ICD-10-CM | POA: Diagnosis not present

## 2016-10-28 DIAGNOSIS — G20 Parkinson's disease: Secondary | ICD-10-CM | POA: Diagnosis not present

## 2016-10-28 DIAGNOSIS — Z79899 Other long term (current) drug therapy: Secondary | ICD-10-CM | POA: Diagnosis not present

## 2016-10-28 NOTE — Therapy (Signed)
[place] [address] [place], [name], [address] Phone: [phone]   Fax:  [phone]  Speech Language Pathology Treatment  Patient Details  Name: Ronald Noble MRN: [id number] Date of Birth: 03/28/1946 Referring Provider: Star Age, MD  Encounter Date: 10/26/2016    Past Medical History:  Diagnosis Date  . BPH (benign prostatic hyperplasia)   . Parkinson disease ([place])   . Skin cancer of face    arms and back  . Sleep apnea    mild  . Spider bite    brown recluse/ left ankle/08/13    Past Surgical History:  Procedure Laterality Date  . BURR HOLE W/ STEREOTACTIC INSERTION OF DBS LEADS / INTRAOP MICROELECTRODE RECORDING    . COLONOSCOPY    . KNEE SURGERY     exploratory/left  . SKIN CANCER EXCISION  01/2014   face  . TRANSURETHRAL RESECTION OF PROSTATE     2008     10/26/16 1542  SLP Visits / Re-Eval  Visit Number 11  Number of Visits 17  Date for SLP Re-Evaluation 10/30/16  SLP Time Calculation  SLP Start Time 1534  SLP Stop Time  6237  SLP Time Calculation (min) 41 min  SLP - End of Session  Activity Tolerance Patient tolerated treatment well     10/26/16 1535  Education  Education provided Yes  PT Education  Education Details use voice texting to monitor accuracy and reduce rate of speech  Person(s) Educated Patient  Methods Explanation;Demonstration  Comprehension Verbalized understanding     10/26/16 1539  Symptoms/Limitations  Subjective "I have good days and bad days."  Pain Assessment  Currently in Pain? Yes  Pain Score 2  Pain Location Shoulder  Pain Orientation Right;Left  Pain Descriptors / Indicators Aching;Dull  Pain Type Chronic pain  Pain Onset In the past 7 days  Pain Frequency Intermittent  Aggravating Factors  certain movements  Pain Relieving Factors rest     10/26/16 1540  General Information  Behavior/Cognition Alert;Cooperative;Pleasant mood   Patient Positioning Upright in chair  Oral care provided N/A  Treatment Provided  Treatment provided Cognitive-Linquistic  Cognitive-Linquistic Treatment  Treatment focused on Dysarthria  Skilled Treatment Speaking volume recalibrated with loud /a/ average of 94dB. SLP engaged pt in short conversations with use of speak-to-text application to improve awareness of short rushes of speech. Pt is noted with less frequent rushes of speech and overall slower rate since last session with this clinician, however he requires cues to view screen to monitor for errors. SLP facilitated moderately complex conversation for 15 minutes, with use of visual cues for slowed rate and increased vocal amplitude. Pt required occasional min-mod A for both rate and volume.  Assessment / Recommendations / Plan  Plan Continue with current plan of care  Progression Toward Goals  Progression toward goals Progressing toward goals     10/26/16 1542  SLP Assessment/Plan  Clinical Impression Statement Patient continues to display reduced awareness of speech patterns of rushes of speech, reduced articulatory precision. SLP used spaced cueing to incr pt's awareness. SKilled ST is necessary for recalibration of speech musculature for production of clear, intelligible speech and to improve pt's cognitive linguistic ability.  Speech Therapy Frequency 2x / week  Treatment/Interventions Compensatory strategies;Patient/family education;Multimodal communcation approach;Internal/external aids;SLP instruction and feedback;Functional tasks;Cueing hierarchy;Cognitive reorganization  Potential to Achieve Goals Fair  Potential Considerations Severity of impairments  Consulted and Agree with Plan of Care Patient   There were no vitals filed for  this visit.      SLP Short Term Goals - 10/26/16 1542      SLP SHORT TERM GOAL #1   Title Pt will average 93dB on loud /a/ over 3 sessions with rare min A   Status Partially Met     SLP  SHORT TERM GOAL #2   Title Pt will average 72dB on structured speech tasks with rare min A   Status Achieved     SLP SHORT TERM GOAL #3   Title Pt will maintain average low 70s dB over 8 minute simple conversation with rare min A   Status Not Met     SLP SHORT TERM GOAL #4   Title pt will maintain speech intelligibility of 95%+ for 8 minutes simple conversation over three sesssions   Status Partially Met     SLP SHORT TERM GOAL #5   Title pt will demo knowledge of 4 different memory compenstions   Status Deferred          SLP Long Term Goals - 10/26/16 1543      SLP LONG TERM GOAL #1   Title Pt will maintain 70dB over 15 minute mod complex conversation with rare min A over three therapy sessions   Baseline 09/21/16, 10-20-16, 10/26/16   Time 2   Period Weeks   Status Achieved     SLP LONG TERM GOAL #2   Title Pt will maintain 95%+ speech intelligibility in noisy environment outside of tx room over 12 minutes with rare min A, over three sessions   Baseline one session 12-20-15, 01-16-16   Time 2   Period Weeks   Status On-going     SLP LONG TERM GOAL #3   Title pt will demo one memory compensation in 3 sessions    Period Weeks   Status Deferred     SLP LONG TERM GOAL #4   Title pt will demo anticipatory awareness to independently double check his work in [employer] complex cognitive linguistic tasks in 7 sessions   Time 2   Period Weeks   Status Deferred     SLP LONG TERM GOAL #5   Title in detailed cognitive linguistic tasks pt will be 100% accurate with modified independence (double-checking his work)   Time 2   Status On-going        Patient will benefit from skilled therapeutic intervention in order to improve the following deficits and impairments:   Dysarthria and anarthria  Cognitive communication deficit    Problem List Patient Active Problem List   Diagnosis Date Noted  . Parkinson disease [facility])    Deneise Lever, [place], CCC-SLP Speech-Language  Pathologist   Aliene Altes 10/28/2016, 8:32 AM  [facility] [address] [place], [name], [address] Phone: [phone]   Fax:  [phone]   Name: Ronald Noble MRN: [id number] Date of Birth: 08/03/1945

## 2016-10-29 ENCOUNTER — Ambulatory Visit: Payer: Medicare Other | Admitting: Physical Therapy

## 2016-10-29 ENCOUNTER — Ambulatory Visit: Payer: Medicare Other | Admitting: Speech Pathology

## 2016-10-29 ENCOUNTER — Ambulatory Visit: Payer: Medicare Other | Admitting: Occupational Therapy

## 2016-10-29 ENCOUNTER — Ambulatory Visit (INDEPENDENT_AMBULATORY_CARE_PROVIDER_SITE_OTHER): Payer: Medicare Other | Admitting: Neurology

## 2016-10-29 ENCOUNTER — Encounter: Payer: Self-pay | Admitting: Neurology

## 2016-10-29 VITALS — BP 116/68 | HR 68 | Ht 71.0 in | Wt 160.0 lb

## 2016-10-29 DIAGNOSIS — R41841 Cognitive communication deficit: Secondary | ICD-10-CM | POA: Diagnosis not present

## 2016-10-29 DIAGNOSIS — R471 Dysarthria and anarthria: Secondary | ICD-10-CM | POA: Diagnosis not present

## 2016-10-29 DIAGNOSIS — R2681 Unsteadiness on feet: Secondary | ICD-10-CM

## 2016-10-29 DIAGNOSIS — G20 Parkinson's disease: Secondary | ICD-10-CM | POA: Diagnosis not present

## 2016-10-29 DIAGNOSIS — K5909 Other constipation: Secondary | ICD-10-CM | POA: Diagnosis not present

## 2016-10-29 DIAGNOSIS — R278 Other lack of coordination: Secondary | ICD-10-CM | POA: Diagnosis not present

## 2016-10-29 DIAGNOSIS — R29898 Other symptoms and signs involving the musculoskeletal system: Secondary | ICD-10-CM

## 2016-10-29 DIAGNOSIS — R41840 Attention and concentration deficit: Secondary | ICD-10-CM

## 2016-10-29 DIAGNOSIS — R293 Abnormal posture: Secondary | ICD-10-CM | POA: Diagnosis not present

## 2016-10-29 DIAGNOSIS — M7989 Other specified soft tissue disorders: Secondary | ICD-10-CM | POA: Diagnosis not present

## 2016-10-29 DIAGNOSIS — Z9689 Presence of other specified functional implants: Secondary | ICD-10-CM

## 2016-10-29 DIAGNOSIS — R2689 Other abnormalities of gait and mobility: Secondary | ICD-10-CM

## 2016-10-29 DIAGNOSIS — R29818 Other symptoms and signs involving the nervous system: Secondary | ICD-10-CM

## 2016-10-29 MED ORDER — CLONAZEPAM 0.5 MG PO TABS: 0.5000 mg | ORAL_TABLET | Freq: Every evening | ORAL | 3 refills | Status: DC | PRN

## 2016-10-29 NOTE — Therapy (Signed)
[facility] [address] [place], [name], [address] Phone: [phone]   Fax:  [phone]  Speech Language Pathology Treatment and Discharge Summary  Patient Details  Name: Ronald Noble MRN: [id number] Date of Birth: [birth year] Referring Provider: Star Age, MD  Encounter Date: 10/29/2016      End of Session - 10/29/16 1707    Visit Number 12   Number of Visits 17   Date for SLP Re-Evaluation 10/30/16   SLP Start Time 1618   SLP Stop Time  1703   SLP Time Calculation (min) 45 min   Activity Tolerance Patient tolerated treatment well      Past Medical History:  Diagnosis Date  . BPH (benign prostatic hyperplasia)   . Parkinson disease ([place])   . Skin cancer of face    arms and back  . Sleep apnea    mild  . Spider bite    brown recluse/ left ankle/08/13    Past Surgical History:  Procedure Laterality Date  . BURR HOLE W/ STEREOTACTIC INSERTION OF DBS LEADS / INTRAOP MICROELECTRODE RECORDING    . COLONOSCOPY    . KNEE SURGERY     exploratory/left  . SKIN CANCER EXCISION  01/2014   face  . TRANSURETHRAL RESECTION OF PROSTATE     2008    There were no vitals filed for this visit.      Subjective Assessment - 10/29/16 1620    Subjective "I feel like I know what to do and I need to keep working at it."   Currently in Pain? No/denies               ADULT SLP TREATMENT - 10/29/16 1616      General Information   Behavior/Cognition Alert;Cooperative;Pleasant mood   Patient Positioning Upright in chair   Oral care provided N/A     Treatment Provided   Treatment provided Cognitive-Linquistic     Pain Assessment   Pain Assessment No/denies pain     Cognitive-Linquistic Treatment   Treatment focused on Dysarthria   Skilled Treatment Pt with spontaneous conversation averaging 69.5 dB upon entering therapy room. Speaking volume recalibrated with loud /a/ average of 93dB. SLP  engaged pt in 15 minutes conversation re: memory deficits and compensations. Speaking volume WFL at 72.4 dB average; pt is noted with rushes of speech x3 and he continues to require occasional min cues to slow rate. SLP facilitated moderately complex conversation for 15 minutes in semi-noisy, distracting environment outside of therapy room. Pt with overall >95% intelligibility, required single cue for repeating short, rushed speech at slower rate. Provided education re: continuing HEP for dysarthria as well as cognitive home program.     Assessment / Recommendations / Plan   Plan Discharge SLP treatment due to (comment)  pt states he is ready for discharge at this time     Progression Toward Goals   Progression toward goals --  Goals partially met, education completed, pt discharged           SLP Education - 10/29/16 1706    Education provided Yes   Education Details Cognitive home tasks and ongoing HEP for maintenance   Person(s) Educated Patient   Methods Explanation;Handout   Comprehension Verbalized understanding          SLP Short Term Goals - 10/29/16 1624      SLP SHORT TERM GOAL #1   Title Pt will average 93dB on loud /a/ over 3 sessions with rare  min A   Status Partially Met     SLP SHORT TERM GOAL #2   Title Pt will average 72dB on structured speech tasks with rare min A   Status Achieved     SLP SHORT TERM GOAL #3   Title Pt will maintain average low 70s dB over 8 minute simple conversation with rare min A   Status Not Met     SLP SHORT TERM GOAL #4   Title pt will maintain speech intelligibility of 95%+ for 8 minutes simple conversation over three sesssions   Status Partially Met     SLP SHORT TERM GOAL #5   Title pt will demo knowledge of 4 different memory compenstions   Status Deferred          SLP Long Term Goals - 11-09-16 1625      SLP LONG TERM GOAL #1   Title Pt will maintain 70dB over 15 minute mod complex conversation with rare min A over  three therapy sessions   Status Achieved     SLP LONG TERM GOAL #2   Title Pt will maintain 95%+ speech intelligibility in noisy environment outside of tx room over 12 minutes with rare min A, over three sessions   Baseline 09-Nov-2016   Time 1   Period Weeks   Status Partially Met     SLP LONG TERM GOAL #3   Title pt will demo one memory compensation in 3 sessions    Status Deferred     SLP LONG TERM GOAL #4   Title pt will demo anticipatory awareness to independently double check his work in [employer] complex cognitive linguistic tasks in 7 sessions   Status Deferred     SLP LONG TERM GOAL #5   Title in detailed cognitive linguistic tasks pt will be 100% accurate with modified independence (double-checking his work)   Time 1   Status Not Met          Plan - Nov 09, 2016 1707    Clinical Impression Statement Patient continues to display reduced awareness of speech patterns of rushes of speech, reduced articulatory precision. Today pt states "I know what I have to do" and states his intent to continue working on his intelligibility at home. Pt states he is ready for discharge today. Pt has attended 12 speech therapy visits with primary focus on his intelligibility vs cognitive communication impairments in memory, awareness. Cognition has been addressed informally during dysarthria/voice sessions. Patient has met 1 long term goal, partially met 1 additional short term goal. Cognitive goals were deferred in order to focus treatment on pt's intelligibility in order to improve carryover of trained techniques. At this time pt to be discharged; recommend he return for f/u in 6-8 months to address ongoing dysarthria and cognitive impairment secondary to Parkinson's Disease.    Speech Therapy Frequency --  d/c   Duration --  d/c   Treatment/Interventions Compensatory strategies;Patient/family education;Multimodal communcation approach;Internal/external aids;SLP instruction and feedback;Functional  tasks;Cueing hierarchy;Cognitive reorganization   Potential to Achieve Goals Fair   Potential Considerations Severity of impairments   SLP Home Exercise Plan Maintenance HEP and cognitive HEP provided   Consulted and Agree with Plan of Care Patient      Patient will benefit from skilled therapeutic intervention in order to improve the following deficits and impairments:   Dysarthria and anarthria  Cognitive communication deficit      G-Codes - 11/09/16 1747    Functional Assessment Tool Used noms (approx 30% impaired)  Functional Limitations Motor speech   Motor Speech Current Status [id number]) At least 20 percent but less than 40 percent impaired, limited or restricted   Motor Speech Goal Status (I3382) At least 1 percent but less than 20 percent impaired, limited or restricted   Motor Speech Goal Status (N0539) At least 20 percent but less than 40 percent impaired, limited or restricted      Problem List Patient Active Problem List   Diagnosis Date Noted  . Parkinson disease ([place])    SPEECH THERAPY DISCHARGE SUMMARY  Visits from Start of Care: 12  Current functional level related to goals / functional outcomes: See goals above   Remaining deficits: Pt continues to display deficits in intelligibility, cognitive communication. See clinical impression for details.   Education / Equipment: Education completed, HEP provided  Plan: Patient agrees to discharge.  Patient goals were partially met. Patient is being discharged due to the patient's request.  ?????    Deneise Lever, [place], CCC-SLP Speech-Language Pathologist  Aliene Altes 10/29/2016, 5:48 PM  [facility] [address] [place], [name], [address] Phone: [phone]   Fax:  [phone]   Name: Chia Rock MRN: [id number] Date of Birth: 06/16/1945

## 2016-10-29 NOTE — Progress Notes (Signed)
Subjective:    Patient ID: Ronald Noble is a 71 y.o. male.  HPI     Interim history:   Ronald Noble is a 71 year old right-handed gentleman with an underlying medical history of skin cancer including melanoma, enlarged prostate status post TURP who presents for followup consultation of his R sided predominant Parkinson's disease, complicated by RBD and sleep disturbance as well as mild dyskinesias, s/p bilateral DBS placement at [facility]. He is accompanied by his wife today. I last saw him on 04/09/2016, at which time he was doing okay, he had improvement in his blood first specimen on the left in facial dyskinesias. He had been followed at [facility]. He was started on amantadine and was taking it twice daily. He was off of Stalevo completely. He felt that his abdominal cramping and facial dyskinesias improved after he came off of Stalevo altogether. He was in the process of being scheduled for Botox injections for his blepharospasm at [facility]. He was still on once daily long-acting Requip, when he tried to stop it completely he noticed a flareup of his RLS. He denied any recent falls. He was exercising regularly. He did have some constipation but generally under control with some regularity in his bowel movements reported.   Today, 10/29/2016: He reports ding okay, no recent falls, left eye droopiness improved after his DBS was adjusted. He did not end up having Botox injection for this. [facility] and [facility] was kept at the same settings. He has been taking Sinemet 1-1/2 pills 5 times a day, 3 hourly intervals, starting approximately at 8 AM. He takes amantadine twice daily and Requip XL around 1 PM. He does suffer from fatigue and his posture is a little bit worse, tendency to speak softly is worse and also leaning more to the right most times. He has had no recent falls, he is more cautious, does admit that [place]. He has been an outpatient therapies with PT, OT, and speech therapy with neuro rehabilitation. He had an appointment with the nurse practitioner at [facility] on 08/03/2016. I reviewed the note. His wife has noted the swelling in his lower extremities. They have been talking about using compression socks for the past several months but he has not actually ventured out to use them. He does work full-time still. Weight has been stable. His constipation may be worse as well. He has a bowel movement every 3-4 days he estimates. He does not sleep very well and recently restarted his melatonin at 10 mg each night and has not been on clonazepam for quite some time.   The patient's allergies, current medications, family history, past medical history, past social history, past surgical history and problem list were reviewed and updated as appropriate.   Previously (copied from previous notes for reference):   I saw him on 07/09/2015, at which time he reported no recent syncope. He had a tilt table test in January 2017 which showed abnormal findings but minimal symptoms reported. He saw Dr. Hartford Poli with vascular surgery for consultation on 05/28/2015. He had a 24-hour blood pressure monitor. He had a follow-up appointment pending with his vascular specialist. He also had an appointment pending with [name] in October 2017. He felt he pulled a muscle in his upper back on the right. He was on Stalevo about 4 times a day, sometimes 5 times a day depending  on his day to day activities. He was on long-acting Requip 6 mg once daily. He was sleeping a little better. He had been advised to start using compression stockings but had not started yet. He had noticed worsening of droopy eyelid. He mailed in December 2017 Re: Blepharospasm and treatment for this. He had been seeing an ophthalmologist.   I saw him on 03/11/2015, at which time he reported a recent fall in September 2016. He had just come back from  a road trip and after being in the car for about 2-1/2 hours he got out of the car and fell to the ground for his wife even realize what happened. He fell on pavement and chipped his tooth. They were with some friends who helped. He had to have a crown placed over his chipped tooth. His DBS settings were adjusted when he had his follow-up at [facility]. His orthostatic blood pressure values were stable. Since his settings were changed he had no other syncopal spell. He did have some medication changes as well. He was no longer on clonazepam. Stalevo was 100 mg strength 4-5 times a day. Requip long-acting was 6 mg once daily. She was on long-acting melatonin 6 mg each night. He was advised to drink 4 bottles of water. He was scheduled for tilt table test as well. His wife was concerned about him climbing on ladders. We talked about gait safety and syncope risk. He was advised not to climb on any ladders or work at heights.    I saw him on 11/07/2014, at which time he reported doing reasonably well. He had nasal congestion after his fall and injury. Of note, unfortunately, he had a syncopal spell on 10/29/2014 and hit his nose as he fell and sustained a nasal fracture. I reviewed the emergency room records. He was referred to ENT. His laceration was repaired with Dermabond. He reported that he stood up and felt lightheaded and then passed out. He fell and hit his face. He had a CT head without contrast as well as maxillofacial CT without contrast on 10/29/2014 which showed: RIGHT and LEFT nasal bone fractures. Small displaced fracture fragment on the LEFT. No skull fracture or intracranial hemorrhage. Unremarkable appearing deep brain stimulator leads. He fell at work. He had taken Levitra at night and 2 days prior he was working hard in the yard. He is notoriously not drinking enough water he admits. He had taken his Stalevo in the morning and was due for his next dose and became lightheaded when he got  up quickly from his desk. He had been sitting for about 45 minutes consistently at his desk. He did have a warning sign and kept walking instead of sitting down again. He had a follow-up appointment at [facility] on 11/02/2014 and his Requip was increased slightly again. He had re-programming of his DBS as well. He had reduced the Requip XL to 3 mg twice daily but this was increased to 4 mg twice daily. His Stalevo was at 4-6 pills daily depending on his work day. He does get sleepy at night around 9:30 and often falls asleep while watching TV. He tried Melatonin long-acting but in combination with clonazepam at night it did not work well and in fact he had insomnia. He tried to reduce his clonazepam to 0.25 mg each night but increased it back to 0.5 mg each night. For his nose fracture he saw Dr. Jenne Pane and was scheduled for surgery. I did not change  his medications. I asked him to drink more water and reminded him to change positions slowly.   I saw him on 08/07/2014, at which time he reported doing quite well after his DBS placement. His wife felt that he was doing great. He was eating well. He was not drinking enough water. He had trace edema in his distal lower extremities. He had 2 appointments for DBS programming. His Stalevo was reduced from 6 pills a day to 4 pills a day but then he increased it back to 5 pills a day. He was off generic Sinemet. He was on Azilect once daily and Requip long-acting 8 mg twice daily.    He's been back to work. He is currently on Azilect once daily, Requip XL 8 mg twice daily, Stalevo 100 mg strength one pill 5 times a day and he has stopped the carbidopa-levodopa. I reviewed his outside records from Dr. Salomon Fick at [facility] neurosurgery. Patient had bilateral STN DBS electrodes placed on 06/01/2014. He had pulse generators placed approximately 3 weeks after that, 06/13/14.   I saw him on 02/01/2014, at which time he reported difficulty  staying asleep. He had not consistently tried melatonin. He had problems with urinary urgency and was advised to try Kegel exercises by his urologist. His PCP talked to an infectious diseases specialist and it was determined he did not actually have Lyme disease.    He did not show for an appointment on 01/30/2014 - he genuinely forgot!   I saw him on 12/19/2013, at which time he reported that his medication was not working as well. He reported more freezing episodes and more off time. Of note, he had been using a new protein milkshake that he was drinking first thing in the morning. He had lost some weight and in an effort to improve his weight loss he had started this new supplements. His primary care physician had referred him to a nutritionist. We also talked about DBS evaluation and he requested to see another specialist for DBS candidacy. I referred him to [name] at [facility] I also advised him to discontinue using the new protein supplement as I felt it may be interfering with the absorption of his Stalevo. I also suggested a trial of Parcopa half a pill 3 times a day at 11, 2 PM and 7 PM and we kept the Stalevo the same. He was advised to use MiraLAX as needed for constipation.   I saw him on 07/31/13, at which time [name], including swimming, yoga, and weights. He has been on Stalevo 100 mg every 2 1/2 hours starting at 7:30 AM for 6 doses. He stopped clonazepam, as he felt some daytime sluggishness. He has been taking C/L 1/2 tid in between. He had no cognitive issues, but did have some "downtime" midday. I did not change his medications. He had an appointment with [name] at [facility] in June, but there was a mix up with his appointment and he does not have an appointment until next year.   In the interim, he was diagnosed with Lyme d/s in July. He was tested and treated by his PCP has has been referred to an infectious d/s doctor. He  brings in his latest lying test results from 12/05/2013 which I reviewed: It looks like his IgM antibody status is negative but his IgG antibody status has not quite turn positive as far as I understand  the report. That means he is probably in the subacute or early chronic phase if I interpret this correctly. He requested a sooner than scheduled appointment, due to feeling more freezing and more off time. This starts in the late morning hours but can also go into the afternoon after lunch. Cognitively and mood wise he feels stable. He does not sleep well at night. He has not consistently tried melatonin I recommended.   I saw him on 01/30/2013, at which time I felt he was stable. We talked about DBS surgery. I did not make any medication changes. He is aware of the increase risk of melanoma associated with DAs and his dermatologist is aware that he is taking Requip XL.   I first met him on 09/27/2012, which time I did not make any changes to his medication and did not order any new test as he was stable. He saw [name] at [facility] and discussed DBS in the past but was not deemed a cadidate for it at the time as he was doing well. He was told by [name] to stay on schedule with his medication. 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 Sleep has been an issue. He goes to sleep well, but wakes up after 45 minutes to 1 hour. He is seeing a urologist. He exercises regularly.   He previously followed with [name] and was last seen by him on 05/25/2012, at which time [name] felt he was stable, but because of off time at 2 hours he increased his carbidopa-levodopa by adding half of a 10-100 mg strength 3 times a day to his regimen. He recommended decreasing his Stalevo to 125 mg from 100 mg 6 times a day. He has been on Stalevo 125 mg 6 times a day, at 7:30, 10, 12:30, 3 PM, 5:30 PM and 8 PM. He is on clonazepam 0.5 mg at night, Requip long-acting 6 mg twice daily, rasagiline 1 mg once  daily, carbidopa-levodopa 10-100 mg strength half a tablet 3 times a day, 7:30 to 10, then midday and later in the evening.   He was followed by [name] since 2008 with a history of Parkinson's disease. He has a history of increased tone and slowness as well as tremor with R sided predominance. He was initially placed on Requip XL and then rasagiline was added in October 2008. In February 2010 Stalevo was added. He had low vitamin D levels in 2008. ANA, ESR, ceruloplasmin were normal except for slightly low ceruloplasmin of 16. Urine copper test was negative. Methylmalonic acid was normal. Urine for heavy metals is normal as well. MRI brain with and without contrast on 01/07/2007 was normal. He had a sleep study in October 2008 showing PLMs and evidence of RBD. Repeat sleep study in September 2011 showed supine obstructive sleep apnea and RBD. He had a CPAP titration study in late September 2011 which was not very successful. He has been on clonazepam at night. He has not noticed any compulsive behaviors, memory loss, depression, swelling, or orthostatic dizziness. He recently had a skin biopsy showing atypical cells, concerning for melanoma.   He works full-time as an Forensic psychologist, [employer]. He exercises regularly and does yoga.    His Past Medical History Is Significant For: Past Medical History:  Diagnosis Date  . BPH (benign prostatic hyperplasia)   . Parkinson disease ([place])   . Skin cancer of face    arms and  back  . Sleep apnea    mild  . Spider bite    brown recluse/ left ankle/08/13    His Past Surgical History Is Significant For: Past Surgical History:  Procedure Laterality Date  . BURR HOLE W/ STEREOTACTIC INSERTION OF DBS LEADS / INTRAOP MICROELECTRODE RECORDING    . COLONOSCOPY    . KNEE SURGERY     exploratory/left  . SKIN CANCER EXCISION  01/2014   face  . TRANSURETHRAL RESECTION OF PROSTATE     2008    His Family History Is Significant For: Family History   Problem Relation Age of Onset  . COPD Mother   . Cancer Father     His Social History Is Significant For: Social History   Social History  . Marital status: Married    Spouse name: Levada Dy  . Number of children: 4  . Years of education: 110   Occupational History  . [place] History Main Topics  . Smoking status: Never Smoker  . Smokeless tobacco: Never Used  . Alcohol use 3.0 oz/week    3 Glasses of wine, 2 Cans of beer per week     Comment: occasional  . Drug use: No  . Sexual activity: Not Asked   Other Topics Concern  . None   Social History Narrative   Patient is right handed and resides with wife   1-2 cups of coffee a day     His Allergies Are:  No Known Allergies:   His Current Medications Are:  Outpatient Encounter Prescriptions as of 10/29/2016  Medication Sig  . amantadine (SYMMETREL) 100 MG capsule   . carbidopa-levodopa (SINEMET IR) 25-100 MG tablet Take 1.5 tablets by mouth 5 (five) times daily.  . Cholecalciferol 2000 units CAPS Take 2,000 Units by mouth daily.  Marland Kitchen desmopressin (DDAVP) 0.2 MG tablet Take 0.6 mg by mouth.  . Melatonin 10 MG TABS Take 10 mg by mouth at bedtime.  . Ropinirole HCl 6 MG TB24   . vitamin B-12 (CYANOCOBALAMIN) 1000 MCG tablet Take by mouth.   No facility-administered encounter medications on file as of 10/29/2016.   :  Review of Systems:  Out of a complete 14 point review of systems, all are reviewed and negative with the exception of these symptoms as listed below:  Review of Systems  Neurological:       Pt presents today to discuss his PD. Pt denies any recent falls.    Objective:  Neurological Exam  Physical Exam Physical Examination:   Vitals:   10/29/16 1259  BP: 116/68  Pulse: 68    General Examination: The patient is a very pleasant 71 y.o. male in no acute distress. She appears well-developed and well-nourished and well groomed.   HEENT: Normocephalic, atraumatic,  pupils are equal, round and reactive to light and accommodation. Extraocular tracking shows mild saccadic breakdown without nystagmus noted. He has some scarring on his nose, but looks well healed. There is no limitation to his gaze, and no significantBlepharospasm of her left hemifacial spasm. Neck is moderately rigid. Oropharynx exam reveals mild mouth dryness. No significant airway crowding is noted. Mallampati is class II. Tongue protrudes centrally and palate elevates symmetrically. There is no drooling. There are no dyskinesias. His stimulator wires are tunneled on the left side.   Chest: is clear to auscultation without wheezing, rhonchi or crackles noted. His generator site is not tender.   Heart: sounds are regular and normal without murmurs, rubs  or gallops noted.   Abdomen: is soft, non-tender and non-distended with normal bowel sounds appreciated on auscultation.  Extremities: There is 1+ edema in the distal lower extremities bilaterally, left more pronounced.   Skin: is warm and dry with no trophic changes noted. Age-related changes are noted on the skin and scars from skin cancer removals.   Musculoskeletal: exam reveals no obvious joint deformities, tenderness, joint swelling or erythema.  Neurologically:  Mental status: The patient is awake and alert, paying good attention. He is able to completely provide the history. He is oriented to: person, place, time/date, situation, day of week, month of year and year. His memory, attention, language and knowledge are appropriate. There is no aphasia, agnosia, apraxia or anomia. There is a no significant degree of bradyphrenia. Speech is mild to moderately hypophonic with no dysarthria noted. Mood is congruent and affect is normal.   Cranial nerves are as described above under HEENT exam. In addition, shoulder shrug is normal with equal shoulder height noted.  Motor exam: Normal bulk, and strength for age is noted. There are no  dyskinesias noted today.  Tone is mildly rigid with presence of cogwheeling in the right upper extremity. There is overall mild to moderate bradykinesia. There is no drift or rebound.  There is a no resting tremor today.   Romberg is negative, with the exception of mild sway.  Reflexes are 1+ in the upper extremities and 2+ in the lower extremities.   Fine motor skills exam:  Fine motor skills are mild to moderately impaired, more pronounced on the R than L, stable. Cerebellar testing shows no dysmetria or intention tremor on finger to nose testing. Heel to shin is unremarkable bilaterally. There is no truncal or gait ataxia.   Sensory exam is intact to light touch throughout .   Gait, station and balance: He stands up from the seated position with no significant difficulty and does not need to push up with His hands. He needs no assistance. No veering to one side is noted. He is noted to lean to the right slightly and posture is slightly worse than last time. He is more stooped. He walks with decreased arm swing bilaterally. Balance is mildly impaired.   Assessment and Plan:   In summary, Ronald Noble is a very pleasant 71 year old male with a history of right-sided predominant Parkinson's disease, complicated by RBD, sleep disturbance, generalized dyskinesias, motor fluctuations, orthostatic hypotension, syncopal spells, falls, constipation and bladder hyperactivity, s/p b/l DBS placement at [facility] on 06/01/14 and generator placement on 06/13/2014. Overall, he has done well. He did have 2 distinct syncopal spells that led to collapse, falls, and injuries. These happened fairly back to back in the summer of 2016. He sustained Fx of nose when he fell in his office, and then also fell in his driveway and chipped a tooth. Since then, he had a crown placed. He had to have nose surgery. He saw a vascular specialist and had a tilt table test at [facility]. He had a 24-hour blood pressure monitor  test. He has recently been started on amantadine which he tolerates and facial dyskinesias have improved. He has weaned himself off Stalevo  some 6 months ago. His weight has been stable, constipation is worse. He is advised to add a stool softener daily. He's also advised to use MiraLAX as needed. I would like for him to strive for a formed bowel movement at least every 2-3 days, and utilize over-the-counter stool softener  or laxative as needed. Constipation can become a severe issue and advancing Parkinson's disease. He  is advised to refrain from climbing ladders or working at heights. He is advised to stay well-hydrated, he is exercising regularly in the form of walking, stationary bike and swimming. I suggested he try clonazepam 0.5 mg at night as needed and he can continue with melatonin. I suggested he continue with Sinemet, Requip XL and amantadine. Nevertheless, his swelling has become worse, this could be in part due to the dopamine agonist. He is furthermore reminded to be in follow-up care with his dermatologist on a regular basis given his history of skin cancer/melanoma. I suggested a 6 month follow-up, sooner as needed. I answered all their questions today and the patient and his wife were in agreement.  I spent 25 minutes in total face-to-face time with the patient, more than 50% of which was spent in counseling and coordination of care, reviewing test results, reviewing medication and discussing or reviewing the diagnosis of PD, its prognosis and treatment options. Pertinent laboratory and imaging test results that were available during this visit with the patient were reviewed by me and considered in my medical decision making (see chart for details).

## 2016-10-29 NOTE — Therapy (Signed)
[facility] [address] [place], [place], [address] Phone: [phone]   Fax:  [phone]  Occupational Therapy Treatment  Patient Details  Name: Ronald Noble MRN: [id number] Date of Birth: [birth year] Referring Provider: Dr. Huston Foley  Encounter Date: 10/29/2016      OT End of Session - 10/29/16 1538    Visit Number 11   Number of Visits 17   Date for OT Re-Evaluation 10/31/16   Authorization Type Medicare/BCBS - G Code needed   Authorization - Visit Number 11   Authorization - Number of Visits 20   OT Start Time 1532   OT Stop Time 1615   OT Time Calculation (min) 43 min   Activity Tolerance Patient tolerated treatment well   Behavior During Therapy [facility] for tasks assessed/performed      Past Medical History:  Diagnosis Date  . BPH (benign prostatic hyperplasia)   . Parkinson disease (HCC)   . Skin cancer of face    arms and back  . Sleep apnea    mild  . Spider bite    brown recluse/ left ankle/08/13    Past Surgical History:  Procedure Laterality Date  . BURR HOLE W/ STEREOTACTIC INSERTION OF DBS LEADS / INTRAOP MICROELECTRODE RECORDING    . COLONOSCOPY    . KNEE SURGERY     exploratory/left  . SKIN CANCER EXCISION  01/2014   face  . TRANSURETHRAL RESECTION OF PROSTATE     2008    There were no vitals filed for this visit.      Subjective Assessment - 10/29/16 1537    Subjective  R shoulder still acting up   Pertinent History PD since 2008, s/p DBS 06/2014, skin cancer, sleep apnea, BPH, TURP, orthostatic hypotension, syncopal episodes; arthritic changes in spine   Limitations DBS, falls, orthostatic hypotension   Patient Stated Goals improve finger dexterity   Currently in Pain? No/denies   Pain Onset In the past 7 days      In supine, scapular depression bilaterally with min cueing.   PWR! Moves in supine (up, rock) with min cueing for incr movement amplitude/shoulder  positioning.  Then PWR! Up and Twist in prone with min cueing for incr movement amplitude and shoulder positioning.  PWR! Rock in [place] with min v.c.  And twist in sitting with min cues for shoulder positioning.    In sitting, functional reaching in overhead with each UE with wt. Shift To place small pegs in vertical pegboard with set-up for large amplitude movements.  Then removing pegs with manipulating 3 in hand for incr coordination with min difficulty each UE.  Pt cued to use vision for coordination.   Dressing:   Simulated ADLs with bag with focus/min cues for large amplitude movements:  Donning/doffing pull-over shirt, donning/doffing pants, pulling bag into hand for clothing adjustment/donning socks (mod difficulty with L hand, min with R), pulling shirt down in back/drying back.  Reviewed importance of large amplitude movement strategies for ADLs to prevent future complications.                       OT Short Term Goals - 10/01/16 1548      OT SHORT TERM GOAL #1   Title Pt will be independent with updated PD specific HEP--STGs due 10/01/16   Time 4   Period Weeks   Status Achieved  10/01/16     OT SHORT TERM GOAL #2   Title Pt will  improve L hand coordination for ADLs as shown by improving time on 9-hole peg test by at least 6sec with LUE.   Baseline L-48.06sec   Time 4   Period Weeks   Status Achieved  10/01/16:  30.34sec     OT SHORT TERM GOAL #3   Title Pt will improve coordination/functional reaching for ADLs as shown by improving score on box and blocks test by at least 4 blocks   Baseline L-39 blocks   Time 4   Period Weeks   Status Achieved  10/01/16: 43 blocks     OT SHORT TERM GOAL #4   Title Pt will be able to fasten/unfasten 3 button in less than 90sec (for improved ability to dress and improved bilateral hand coordination).   Baseline >72mn/unable on tabletop   Time 4   Period Weeks   Status Achieved  10/01/16: 38.81sec      OT SHORT  TERM GOAL #5   Title Pt will verbalize understanding of updated community resources prn.   Time 4   Period Weeks   Status Achieved  10/01/16           OT Long Term Goals - 10/29/16 1540      OT LONG TERM GOAL #1   Title Pt will verbalize understanding of adaptive strategies to incr ease with ADL's/IADLS's--LTGs due 73/24/40  Time 8   Period Weeks   Status Achieved  10/26/16:  instructed, but would benefit from review     OT LONG TERM GOAL #2   Title Pt will improve coordination for ADLs as shown by improving time on 9-hole peg test by at least 12sec with LUE.   Baseline 48.06sec   Time 8   Period Weeks   Status Achieved  10/01/16     OT LONG TERM GOAL #3   Title Pt will improve functional reaching/coordination for ADLs as shown by improving score on box and blocks test by at least 8blocks with LUE.   Baseline 39   Status Achieved  10/26/16:  R-50 blocks, L-50blocks     OT LONG TERM GOAL #4   Title Pt will be able to fasten/unfasten 3 button in less than 70sec (for improved ability to dress and improved bilateral hand coordination).   Baseline unable, >257m   Time 8   Period Weeks   Status Achieved  10/01/16     OT LONG TERM GOAL #5   Title Assess standing functional reach and add goal prn   Time 8   Status Deferred  09/08/16:  WNL               Plan - 10/29/16 1538    Clinical Impression Statement Pt has made good progress towards goals and is appropriate for d/c at this time.   Rehab Potential Good   Current Impairments/barriers affecting progress: decr awareness, s/p DBS   OT Frequency 2x / week   OT Duration 8 weeks  +eval   OT Treatment/Interventions Self-care/ADL training;Moist Heat;DME and/or AE instruction;Patient/family education;Therapeutic exercises;Therapeutic activities;Neuromuscular education;Functional Mobility Training;Passive range of motion;Manual Therapy;Cognitive remediation/compensation;Visual/perceptual remediation/compensation;Energy  conservation   Plan d/c OT; schedule screen in approx 7-8 months   OT Home Exercise Plan Education provided:  Reviewed PWR! hands and coordination HEP, reviewed PWR! moves (up, rock, twist) in supine, quadraped, and prone   Consulted and Agree with Plan of Care Patient      Patient will benefit from skilled therapeutic intervention in order to improve the following deficits and impairments:  Decreased coordination, Difficulty walking, Impaired flexibility, Improper body mechanics, Decreased endurance, Decreased safety awareness, Improper spinal/pelvic alignment, Decreased knowledge of precautions, Decreased activity tolerance, Impaired tone, Decreased balance, Decreased knowledge of use of DME, Impaired UE functional use, Decreased cognition, Decreased mobility, Impaired vision/preception, Impaired perceived functional ability  Visit Diagnosis: Other symptoms and signs involving the nervous system  Other symptoms and signs involving the musculoskeletal system  Other lack of coordination  Abnormal posture  Other abnormalities of gait and mobility  Attention and concentration deficit  Unsteadiness on feet      G-Codes - 2016-11-18 1541    Functional Assessment Tool Used (Outpatient only) L 9-hole peg test 30.34 sec; L Box and blocks test 50 blocks; unable to fasten/unfasten 3 buttons on table 38.81sec   Functional Limitation Carrying, moving and handling objects   Carrying, Moving and Handling Objects Goal Status ([id number]) At least 1 percent but less than 20 percent impaired, limited or restricted   Carrying, Moving and Handling Objects Discharge Status [phone]) At least 1 percent but less than 20 percent impaired, limited or restricted      Problem List Patient Active Problem List   Diagnosis Date Noted  . Parkinson disease ([place])     OCCUPATIONAL THERAPY DISCHARGE SUMMARY  Visits from Start of Care: 11  Current functional level related to goals / functional outcomes: See  above   Remaining deficits: Bradykinesia/hypokinesia, rigidity, dystonia, dyskinesias, decr coordination, decr balance for IADLs/ADLs   Education / Equipment: Pt was instructed in the following:  PD-specific HEP, adaptive strategies for ADLs/IADLs, ways to prevent future complications, appropriate community resources.  Pt verbalized understanding of all education provided.     Plan: Patient agrees to discharge.  Patient goals were met. Patient is being discharged due to meeting the stated rehab goals. Pt would benefit from occupational therapy screen in approx 7-8 months to assess for need for further therapy/functional changes due to progressive nature of diagnosis. ????        [facility] 2016/11/18, 4:23 PM  [place] [address] [place], [name], [address] Phone: [phone]   Fax:  [phone]  Name: Ronald Noble MRN: [id number] Date of Birth: 28-Mar-1946   Vianne Bulls, OTR/L [name] [address] [place], [place]  [address] [phone] phone [phone] Nov 18, 2016 4:23 PM

## 2016-10-29 NOTE — Patient Instructions (Addendum)
Please consider using compression socks to your legs, knee highs.  Please monitor swelling of legs.  Constipation can be a big problem in advanced Parkinson's disease. It is important to be proactive: this includes ensuring adequate water intake and mobilization (walking around), utilizing stool softeners as needed, an over-the-counter laxative as needed up to daily if needed, adding a probiotic in pill form or in the form of yogurt can help as well. Sometimes, using a suppository or enema becomes necessary.  Continue with your exercises.  May need to see ortho.

## 2017-01-04 DIAGNOSIS — Z6822 Body mass index (BMI) 22.0-22.9, adult: Secondary | ICD-10-CM | POA: Diagnosis not present

## 2017-01-04 DIAGNOSIS — H6692 Otitis media, unspecified, left ear: Secondary | ICD-10-CM | POA: Diagnosis not present

## 2017-01-06 DIAGNOSIS — R2689 Other abnormalities of gait and mobility: Secondary | ICD-10-CM | POA: Diagnosis not present

## 2017-01-06 DIAGNOSIS — B353 Tinea pedis: Secondary | ICD-10-CM | POA: Diagnosis not present

## 2017-01-06 DIAGNOSIS — L602 Onychogryphosis: Secondary | ICD-10-CM | POA: Diagnosis not present

## 2017-01-06 DIAGNOSIS — M6281 Muscle weakness (generalized): Secondary | ICD-10-CM | POA: Diagnosis not present

## 2017-01-08 DIAGNOSIS — H2513 Age-related nuclear cataract, bilateral: Secondary | ICD-10-CM | POA: Diagnosis not present

## 2017-01-13 DIAGNOSIS — H6502 Acute serous otitis media, left ear: Secondary | ICD-10-CM | POA: Diagnosis not present

## 2017-01-13 DIAGNOSIS — H90A32 Mixed conductive and sensorineural hearing loss, unilateral, left ear with restricted hearing on the contralateral side: Secondary | ICD-10-CM | POA: Diagnosis not present

## 2017-01-18 NOTE — Telephone Encounter (Signed)
Closing Encounter.

## 2017-02-23 DIAGNOSIS — G20 Parkinson's disease: Secondary | ICD-10-CM | POA: Diagnosis not present

## 2017-02-23 DIAGNOSIS — G245 Blepharospasm: Secondary | ICD-10-CM | POA: Diagnosis not present

## 2017-02-23 DIAGNOSIS — I951 Orthostatic hypotension: Secondary | ICD-10-CM | POA: Diagnosis not present

## 2017-02-23 DIAGNOSIS — Z969 Presence of functional implant, unspecified: Secondary | ICD-10-CM | POA: Diagnosis not present

## 2017-03-03 DIAGNOSIS — M546 Pain in thoracic spine: Secondary | ICD-10-CM | POA: Diagnosis not present

## 2017-03-03 DIAGNOSIS — Z6822 Body mass index (BMI) 22.0-22.9, adult: Secondary | ICD-10-CM | POA: Diagnosis not present

## 2017-03-03 DIAGNOSIS — G20 Parkinson's disease: Secondary | ICD-10-CM | POA: Diagnosis not present

## 2017-03-03 DIAGNOSIS — Z1389 Encounter for screening for other disorder: Secondary | ICD-10-CM | POA: Diagnosis not present

## 2017-03-03 DIAGNOSIS — M545 Low back pain: Secondary | ICD-10-CM | POA: Diagnosis not present

## 2017-03-03 DIAGNOSIS — H6983 Other specified disorders of Eustachian tube, bilateral: Secondary | ICD-10-CM | POA: Diagnosis not present

## 2017-03-23 DIAGNOSIS — D2271 Melanocytic nevi of right lower limb, including hip: Secondary | ICD-10-CM | POA: Diagnosis not present

## 2017-03-23 DIAGNOSIS — Z85828 Personal history of other malignant neoplasm of skin: Secondary | ICD-10-CM | POA: Diagnosis not present

## 2017-03-23 DIAGNOSIS — L853 Xerosis cutis: Secondary | ICD-10-CM | POA: Diagnosis not present

## 2017-03-23 DIAGNOSIS — L565 Disseminated superficial actinic porokeratosis (DSAP): Secondary | ICD-10-CM | POA: Diagnosis not present

## 2017-03-23 DIAGNOSIS — L430 Hypertrophic lichen planus: Secondary | ICD-10-CM | POA: Diagnosis not present

## 2017-03-23 DIAGNOSIS — D225 Melanocytic nevi of trunk: Secondary | ICD-10-CM | POA: Diagnosis not present

## 2017-03-23 DIAGNOSIS — L570 Actinic keratosis: Secondary | ICD-10-CM | POA: Diagnosis not present

## 2017-03-23 DIAGNOSIS — D1801 Hemangioma of skin and subcutaneous tissue: Secondary | ICD-10-CM | POA: Diagnosis not present

## 2017-03-23 DIAGNOSIS — D485 Neoplasm of uncertain behavior of skin: Secondary | ICD-10-CM | POA: Diagnosis not present

## 2017-03-23 DIAGNOSIS — D2262 Melanocytic nevi of left upper limb, including shoulder: Secondary | ICD-10-CM | POA: Diagnosis not present

## 2017-03-23 DIAGNOSIS — L821 Other seborrheic keratosis: Secondary | ICD-10-CM | POA: Diagnosis not present

## 2017-03-25 DIAGNOSIS — E559 Vitamin D deficiency, unspecified: Secondary | ICD-10-CM | POA: Diagnosis not present

## 2017-03-25 DIAGNOSIS — R82998 Other abnormal findings in urine: Secondary | ICD-10-CM | POA: Diagnosis not present

## 2017-03-25 DIAGNOSIS — E7849 Other hyperlipidemia: Secondary | ICD-10-CM | POA: Diagnosis not present

## 2017-03-25 DIAGNOSIS — Z125 Encounter for screening for malignant neoplasm of prostate: Secondary | ICD-10-CM | POA: Diagnosis not present

## 2017-04-05 DIAGNOSIS — M545 Low back pain: Secondary | ICD-10-CM | POA: Diagnosis not present

## 2017-04-05 DIAGNOSIS — R5383 Other fatigue: Secondary | ICD-10-CM | POA: Diagnosis not present

## 2017-04-05 DIAGNOSIS — Z1389 Encounter for screening for other disorder: Secondary | ICD-10-CM | POA: Diagnosis not present

## 2017-04-05 DIAGNOSIS — M546 Pain in thoracic spine: Secondary | ICD-10-CM | POA: Diagnosis not present

## 2017-04-05 DIAGNOSIS — N528 Other male erectile dysfunction: Secondary | ICD-10-CM | POA: Diagnosis not present

## 2017-04-05 DIAGNOSIS — H6980 Other specified disorders of Eustachian tube, unspecified ear: Secondary | ICD-10-CM | POA: Diagnosis not present

## 2017-04-05 DIAGNOSIS — H6692 Otitis media, unspecified, left ear: Secondary | ICD-10-CM | POA: Diagnosis not present

## 2017-04-05 DIAGNOSIS — G4733 Obstructive sleep apnea (adult) (pediatric): Secondary | ICD-10-CM | POA: Diagnosis not present

## 2017-04-05 DIAGNOSIS — G20 Parkinson's disease: Secondary | ICD-10-CM | POA: Diagnosis not present

## 2017-04-05 DIAGNOSIS — Z6822 Body mass index (BMI) 22.0-22.9, adult: Secondary | ICD-10-CM | POA: Diagnosis not present

## 2017-04-05 DIAGNOSIS — Z0000 Encounter for general adult medical examination without abnormal findings: Secondary | ICD-10-CM | POA: Diagnosis not present

## 2017-04-05 DIAGNOSIS — M859 Disorder of bone density and structure, unspecified: Secondary | ICD-10-CM | POA: Diagnosis not present

## 2017-04-06 HISTORY — PX: COLONOSCOPY: SHX174

## 2017-04-07 ENCOUNTER — Encounter: Payer: Self-pay | Admitting: Physician Assistant

## 2017-04-07 ENCOUNTER — Telehealth: Payer: Self-pay | Admitting: Internal Medicine

## 2017-04-07 NOTE — Telephone Encounter (Signed)
Previous [facility] pt. Last colon report states to repeat colon in 5 years due to poor prep/visualization. Recall in epic for 10 year recall. Please advise as DOD regarding recall.

## 2017-04-07 NOTE — Telephone Encounter (Signed)
Based on available information he may be due for follow-up colonoscopy at this time. However, he does have some significant medical problems. It would be best if he see one of the advanced practitioner's to evaluate appropriateness for follow-up colonoscopy. If appropriate, he can be instructed on a more extensive 2 day prep to assure adequacy. If colonoscopy is pursued, this may be scheduled with GI supervising physician that day.

## 2017-04-07 NOTE — Telephone Encounter (Signed)
See comments below from Dr. Henrene Pastor.

## 2017-04-07 NOTE — Telephone Encounter (Signed)
Office visit scheduled for 04/26/17 with Nicoletta Ba, PA-C

## 2017-04-20 DIAGNOSIS — Z1212 Encounter for screening for malignant neoplasm of rectum: Secondary | ICD-10-CM | POA: Diagnosis not present

## 2017-04-21 DIAGNOSIS — M6281 Muscle weakness (generalized): Secondary | ICD-10-CM | POA: Diagnosis not present

## 2017-04-21 DIAGNOSIS — L602 Onychogryphosis: Secondary | ICD-10-CM | POA: Diagnosis not present

## 2017-04-21 DIAGNOSIS — R2689 Other abnormalities of gait and mobility: Secondary | ICD-10-CM | POA: Diagnosis not present

## 2017-04-21 DIAGNOSIS — B353 Tinea pedis: Secondary | ICD-10-CM | POA: Diagnosis not present

## 2017-04-26 ENCOUNTER — Encounter: Payer: Self-pay | Admitting: Physician Assistant

## 2017-04-26 ENCOUNTER — Ambulatory Visit (INDEPENDENT_AMBULATORY_CARE_PROVIDER_SITE_OTHER): Payer: Medicare Other | Admitting: Physician Assistant

## 2017-04-26 VITALS — BP 110/78 | HR 72 | Ht 71.0 in | Wt 159.1 lb

## 2017-04-26 DIAGNOSIS — Z1211 Encounter for screening for malignant neoplasm of colon: Secondary | ICD-10-CM

## 2017-04-26 DIAGNOSIS — G20 Parkinson's disease: Secondary | ICD-10-CM | POA: Diagnosis not present

## 2017-04-26 DIAGNOSIS — K5900 Constipation, unspecified: Secondary | ICD-10-CM

## 2017-04-26 MED ORDER — NA SULFATE-K SULFATE-MG SULF 17.5-3.13-1.6 GM/177ML PO SOLN: 1.0000 | Freq: Once | ORAL | 0 refills | Status: AC

## 2017-04-26 NOTE — Patient Instructions (Addendum)
You have been scheduled for a colonoscopy. Please follow written instructions given to you at your visit today.  Please pick up your prep supplies at the pharmacy within the next 1-3 days. [facility] [address] [employer]. If you use inhalers (even only as needed), please bring them with you on the day of your procedure.  If you are age 71 or older, your body mass index should be between 23-30. Your Body mass index is 22.19 kg/m. If this is out of the aforementioned range listed, please consider follow up with your Primary Care Provider.

## 2017-04-26 NOTE — Progress Notes (Signed)
Subjective:    Patient ID: Ronald Noble, male    DOB: 1946/03/04, 72 y.o.   MRN: [id number]  HPI [name] is a pleasant 72 year old white male, referred today for follow-up colonoscopy per Dr. Joylene Draft. Patient had undergone previous colonoscopy with Dr. Delfin Edis in 2003 which was normal, and again in October 2013 with finding of a long hypotonic colon with suboptimal prep. He had one diminutive polyp removed but path showed benign colonic mucosa. He was advised to have 5 year interval follow-up because of the poor prep. Patient has Parkinson's disease, he has had a deep brain stimulator placed in 2016. 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 He comes in alone today. He has no current GI complaints. He says actually he has not had any problems with constipation or some time and is generally having 1-2 bowel movements per day which have been soft. He has no complaints of abdominal discomfort, no melena or hematochezia.  Review of Systems Pertinent positive and negative review of systems were noted in the above HPI section.  All other review of systems was otherwise negative.  Outpatient Encounter Medications as of 04/26/2017  Medication Sig  . amantadine (SYMMETREL) 100 MG capsule   . carbidopa-levodopa (SINEMET IR) 25-100 MG tablet Take 1.5 tablets by mouth 5 (five) times daily.  Marland Kitchen desmopressin (DDAVP) 0.2 MG tablet Take 0.6 mg by mouth.  . Ropinirole HCl 6 MG TB24   . Na Sulfate-K Sulfate-Mg Sulf 17.5-3.13-1.6 GM/177ML SOLN Take 1 kit by mouth once for 1 dose.  . [DISCONTINUED] Cholecalciferol 2000 units CAPS Take 2,000 Units by mouth daily.  . [DISCONTINUED] clonazePAM (KLONOPIN) 0.5 MG tablet Take 1 tablet (0.5 mg total) by mouth at bedtime as needed for anxiety.  . [DISCONTINUED] Melatonin 10 MG TABS Take 10 mg by mouth at bedtime.  . [DISCONTINUED] vitamin B-12 (CYANOCOBALAMIN) 1000 MCG tablet Take by mouth.   No facility-administered encounter medications on file as of  04/26/2017.    No Known Allergies Patient Active Problem List   Diagnosis Date Noted  . Parkinson disease [facility])    Social History   Socioeconomic History  . Marital status: Married    Spouse name: Levada Dy  . Number of children: 4  . Years of education: 74  . Highest education level: Not on file  Social Needs  . Financial resource strain: Not on file  . Food insecurity - worry: Not on file  . Food insecurity - inability: Not on file  . Transportation needs - medical: Not on file  . Transportation needs - non-medical: Not on file  Occupational History  . Occupation: LAWYER    Employer: [employer]  Tobacco Use  . Smoking status: Never Smoker  . Smokeless tobacco: Never Used  Substance and Sexual Activity  . Alcohol use: Yes    Alcohol/week: 3.0 oz    Types: 3 Glasses of wine, 2 Cans of beer per week    Comment: occasional  . Drug use: No  . Sexual activity: Not on file  Other Topics Concern  . Not on file  Social History Narrative   Patient is right handed and resides with wife   1-2 cups of coffee a day     Mr. Schnabel family history includes COPD in his mother; Cancer in his father.      Objective:    Vitals:   04/26/17 1331  BP: 110/78  Pulse: 72    Physical Exam ; well-developed older white male  in no acute distress, pleasant blood pressure 110/78 pulse 72, height 5 foot 11, weight 159, BMI of 22.1. HEENT; nontraumatic normocephalic EOMI PERRLA sclera anicteric, Cardiovascular; regular rate and rhythm with S1-S2 no murmur or gallop, Pulmonary ;clear bilaterally, Abdomen; soft, nontender nondistended bowel sounds are active there is no palpable mass or hepatosplenomegaly, Rectal ;exam not done, Extremities ;no clubbing cyanosis or edema skin warm and dry, Neuropsych ;mood and affect appropriate, no obvious tremor, patient has a deep brain stimulator in the left chest wall       Assessment & Plan:   #49 72 year old white male with Parkinson's  disease referred for screening colonoscopy. Patient is asymptomatic. He last had colonoscopy in October 2013 with a suboptimal prep and was advised to have interval 5 year follow-up. He had removal of a diminutive polyp which by biopsy was benign colonic mucosa.  Plan; Patient will be scheduled for colonoscopy with Dr. Ardis Hughs. Procedure was discussed in detail with the patient including indications risks and benefits and he is agreeable to proceed. We will give him an extended prep with a half of a MoviPrep the day prior to procedure followed by split dose Suprep  Alfredia Ferguson PA-C 04/26/2017   Cc: [name], MD

## 2017-04-27 NOTE — Progress Notes (Signed)
I agree with the above note, plan 

## 2017-04-28 ENCOUNTER — Ambulatory Visit (INDEPENDENT_AMBULATORY_CARE_PROVIDER_SITE_OTHER): Payer: Medicare Other | Admitting: Neurology

## 2017-04-28 ENCOUNTER — Encounter: Payer: Self-pay | Admitting: Neurology

## 2017-04-28 VITALS — BP 127/79 | HR 76 | Ht 71.0 in | Wt 160.0 lb

## 2017-04-28 DIAGNOSIS — G4752 REM sleep behavior disorder: Secondary | ICD-10-CM | POA: Diagnosis not present

## 2017-04-28 DIAGNOSIS — G479 Sleep disorder, unspecified: Secondary | ICD-10-CM | POA: Diagnosis not present

## 2017-04-28 MED ORDER — CLONAZEPAM 0.25 MG PO TBDP: 0.2500 mg | ORAL_TABLET | Freq: Every day | ORAL | 5 refills | Status: DC

## 2017-04-28 NOTE — Progress Notes (Signed)
Subjective:    Patient ID: Ronald Noble is a 72 y.o. male.  HPI     Interim history:   Ronald Noble is a 72 year old right-handed gentleman with an underlying medical history of skin cancer including melanoma, enlarged prostate status post TURP who presents for followup consultation of Ronald Noble R sided predominant Parkinson's disease, complicated by RBD and sleep disturbance as well as mild dyskinesias, s/p bilateral DBS placement at [facility]. Ronald Noble is accompanied by Ronald Noble wife today. I last saw him on 10/29/2016, at which time Ronald Noble reported doing okay, Ronald Noble had no recent falls, Ronald Noble left eye ptosis improved after adjustment of Ronald Noble DBS. Ronald Noble did not end up having any Botox injections to Ronald Noble eyelid. Ronald Noble was on Sinemet 1-1/2 pills 5 times a day, 3 hourly intervals, starting approximately at 8 AM. Ronald Noble was on amantadine twice daily and Requip XL around 1 PM.   Today, 04/28/2017: Ronald Noble reports doing okay, has some daytime somnolence, does not sleep as well at night, has some low back pain and shoulder pain on the right side, has had not had any falls, Ronald Noble tries to eat right, weight has been stable. Ronald Noble has to get cataract surgeries and colonoscopy done. Ronald Noble left eye has done well after Botox injections, Ronald Noble is due for another injection soon. Ronald Noble is still on amantadine twice daily, 8 AM and 2 PM, Requip XL generic 6 mg once daily at 2 PM and Sinemet 1-1/2 pills 5 times a day, 3 hourly. Ronald Noble has some twitching in Ronald Noble right lower extremity, also restless leg symptoms are worse at night, has some evidence of dream enactments with movements and yelling out from time to time. Wife cannot only sleep in the same bed with him. Of note, Ronald Noble saw [name] at [facility] on 02/23/2017 and received Botox injection to Ronald Noble left eye lid for ongoing issues with ptosis on the left.  The patient's allergies, current medications, family history, past medical history, past social history, past surgical history and problem list were reviewed and  updated as appropriate.    Previously (copied from previous notes for reference):    I saw him on 04/09/2016, at which time Ronald Noble was doing okay, Ronald Noble had improvement in Ronald Noble blood first specimen on the left in facial dyskinesias. Ronald Noble had been followed at [facility]. Ronald Noble was started on amantadine and was taking it twice daily. Ronald Noble was off of Stalevo completely. Ronald Noble felt that Ronald Noble abdominal cramping and facial dyskinesias improved after Ronald Noble came off of Stalevo altogether. Ronald Noble was in the process of being scheduled for Botox injections for Ronald Noble blepharospasm at [facility]. Ronald Noble was still on once daily long-acting Requip, when Ronald Noble tried to stop it completely Ronald Noble noticed a flareup of Ronald Noble RLS. Ronald Noble denied any recent falls. Ronald Noble was exercising regularly. Ronald Noble did have some constipation but generally under control with some regularity in Ronald Noble bowel movements reported.     I saw him on 07/09/2015, at which time Ronald Noble reported no recent syncope. Ronald Noble had a tilt table test in January 2017 which showed abnormal findings but minimal symptoms reported. Ronald Noble saw Dr. Hartford Poli with vascular surgery for consultation on 05/28/2015. Ronald Noble had a 24-hour blood pressure monitor. Ronald Noble had a follow-up appointment pending with Ronald Noble vascular specialist. Ronald Noble also had an appointment pending with [name] in October 2017. Ronald Noble felt Ronald Noble pulled a muscle in Ronald Noble upper back on the right. Ronald Noble was on Stalevo about 4 times a day, sometimes 5 times a day depending on  Ronald Noble day to day activities. Ronald Noble was on long-acting Requip 6 mg once daily. Ronald Noble was sleeping a little better. Ronald Noble had been advised to start using compression stockings but had not started yet. Ronald Noble had noticed worsening of droopy eyelid. Ronald Noble mailed in December 2017 Re: Blepharospasm and treatment for this. Ronald Noble had been seeing an ophthalmologist.   I saw him on 03/11/2015, at which time Ronald Noble reported a recent fall in September 2016. Ronald Noble had just come back from a road trip and after being in the car for about 2-1/2 hours Ronald Noble got out of  the car and fell to the ground for Ronald Noble wife even realize what happened. Ronald Noble fell on pavement and chipped Ronald Noble tooth. They were with some friends who helped. Ronald Noble had to have a crown placed over Ronald Noble chipped tooth. Ronald Noble DBS settings were adjusted when Ronald Noble had Ronald Noble follow-up at [facility]. Ronald Noble orthostatic blood pressure values were stable. Since Ronald Noble settings were changed Ronald Noble had no other syncopal spell. Ronald Noble did have some medication changes as well. Ronald Noble was no longer on clonazepam. Stalevo was 100 mg strength 4-5 times a day. Requip long-acting was 6 mg once daily. She was on long-acting melatonin 6 mg each night. Ronald Noble was advised to drink 4 bottles of water. Ronald Noble was scheduled for tilt table test as well. Ronald Noble wife was concerned about him climbing on ladders. We talked about gait safety and syncope risk. Ronald Noble was advised not to climb on any ladders or work at heights.    I saw him on 11/07/2014, at which time Ronald Noble reported doing reasonably well. Ronald Noble had nasal congestion after Ronald Noble fall and injury. Of note, unfortunately, Ronald Noble had a syncopal spell on 10/29/2014 and hit Ronald Noble nose as Ronald Noble fell and sustained a nasal fracture. I reviewed the emergency room records. Ronald Noble was referred to ENT. Ronald Noble laceration was repaired with Dermabond. Ronald Noble reported that Ronald Noble stood up and felt lightheaded and then passed out. Ronald Noble fell and hit Ronald Noble face. Ronald Noble had a CT head without contrast as well as maxillofacial CT without contrast on 10/29/2014 which showed: RIGHT and LEFT nasal bone fractures. Small displaced fracture fragment on the LEFT. No skull fracture or intracranial hemorrhage. Unremarkable appearing deep brain stimulator leads. Ronald Noble fell at work. Ronald Noble had taken Levitra at night and 2 days prior Ronald Noble was working hard in the yard. Ronald Noble is notoriously not drinking enough water Ronald Noble admits. Ronald Noble had taken Ronald Noble Stalevo in the morning and was due for Ronald Noble next dose and became lightheaded when Ronald Noble got up quickly from Ronald Noble desk. Ronald Noble had been sitting for about 45 minutes  consistently at Ronald Noble desk. Ronald Noble did have a warning sign and kept walking instead of sitting down again. Ronald Noble had a follow-up appointment at [facility] on 11/02/2014 and Ronald Noble Requip was increased slightly again. Ronald Noble had re-programming of Ronald Noble DBS as well. Ronald Noble had reduced the Requip XL to 3 mg twice daily but this was increased to 4 mg twice daily. Ronald Noble Stalevo was at 4-6 pills daily depending on Ronald Noble work day. Ronald Noble does get sleepy at night around 9:30 and often falls asleep while watching TV. Ronald Noble tried Melatonin long-acting but in combination with clonazepam at night it did not work well and in fact Ronald Noble had insomnia. Ronald Noble tried to reduce Ronald Noble clonazepam to 0.25 mg each night but increased it back to 0.5 mg each night. For Ronald Noble nose fracture Ronald Noble saw Dr. Redmond Baseman and was scheduled for surgery. I did not change Ronald Noble  medications. I asked him to drink more water and reminded him to change positions slowly.   I saw him on 08/07/2014, at which time Ronald Noble reported doing quite well after Ronald Noble DBS placement. Ronald Noble wife felt that Ronald Noble was doing great. Ronald Noble was eating well. Ronald Noble was not drinking enough water. Ronald Noble had trace edema in Ronald Noble distal lower extremities. Ronald Noble had 2 appointments for DBS programming. Ronald Noble Stalevo was reduced from 6 pills a day to 4 pills a day but then Ronald Noble increased it back to 5 pills a day. Ronald Noble was off generic Sinemet. Ronald Noble was on Azilect once daily and Requip long-acting 8 mg twice daily.    Ronald Noble's been back to work. Ronald Noble is currently on Azilect once daily, Requip XL 8 mg twice daily, Stalevo 100 mg strength one pill 5 times a day and Ronald Noble has stopped the carbidopa-levodopa. I reviewed Ronald Noble outside records from Dr. Salomon Fick at [facility] neurosurgery. Patient had bilateral STN DBS electrodes placed on 06/01/2014. Ronald Noble had pulse generators placed approximately 3 weeks after that, 06/13/14.   I saw him on 02/01/2014, at which time Ronald Noble reported difficulty staying asleep. Ronald Noble had not consistently tried melatonin. Ronald Noble had  problems with urinary urgency and was advised to try Kegel exercises by Ronald Noble urologist. Ronald Noble PCP talked to an infectious diseases specialist and it was determined Ronald Noble did not actually have Lyme disease.    Ronald Noble did not show for an appointment on 01/30/2014 - Ronald Noble genuinely forgot!   I saw him on 12/19/2013, at which time Ronald Noble reported that Ronald Noble medication was not working as well. Ronald Noble reported more freezing episodes and more off time. Of note, Ronald Noble had been using a new protein milkshake that Ronald Noble was drinking first thing in the morning. Ronald Noble had lost some weight and in an effort to improve Ronald Noble weight loss Ronald Noble had started this new supplements. Ronald Noble primary care physician had referred him to a nutritionist. We also talked about DBS evaluation and Ronald Noble requested to see another specialist for DBS candidacy. I referred him to [name] at [facility] I also advised him to discontinue using the new protein supplement as I felt it may be interfering with the absorption of Ronald Noble Stalevo. I also suggested a trial of Parcopa half a pill 3 times a day at 11, 2 PM and 7 PM and we kept the Stalevo the same. Ronald Noble was advised to use MiraLAX as needed for constipation.   I saw him on 07/31/13, at which time her reported that was still working FT and Ronald Noble exercised regularly, including swimming, yoga, and weights. Ronald Noble has been on Stalevo 100 mg every 2 1/2 hours starting at 7:30 AM for 6 doses. Ronald Noble stopped clonazepam, as Ronald Noble felt some daytime sluggishness. Ronald Noble has been taking C/L 1/2 tid in between. Ronald Noble had no cognitive issues, but did have some "downtime" midday. I did not change Ronald Noble medications. Ronald Noble had an appointment with [name] at [facility] in June, but there was a mix up with Ronald Noble appointment and Ronald Noble does not have an appointment until next year.   In the interim, Ronald Noble was diagnosed with Lyme d/s in July. Ronald Noble was tested and treated by Ronald Noble PCP has has been referred to an infectious d/s doctor. Ronald Noble brings in Ronald Noble latest lying test results from 12/05/2013 which I  reviewed: It looks like Ronald Noble IgM antibody status is negative but Ronald Noble IgG antibody status has not quite turn positive as far as I understand the  report. That means Ronald Noble is probably in the subacute or early chronic phase if I interpret this correctly. Ronald Noble requested a sooner than scheduled appointment, due to feeling more freezing and more off time. This starts in the late morning hours but can also go into the afternoon after lunch. Cognitively and mood wise Ronald Noble feels stable. Ronald Noble does not sleep well at night. Ronald Noble has not consistently tried melatonin I recommended.   I saw him on 01/30/2013, at which time I felt Ronald Noble was stable. We talked about DBS surgery. I did not make any medication changes. Ronald Noble is aware of the increase risk of melanoma associated with DAs and Ronald Noble dermatologist is aware that Ronald Noble is taking Requip XL.   I first met him on 09/27/2012, which time I did not make any changes to Ronald Noble medication and did not order any new test as Ronald Noble was stable. Ronald Noble saw [name] at [facility] and discussed DBS in the past but was not deemed a cadidate for it at the time as Ronald Noble was doing well. Ronald Noble was told by [name] to stay on schedule with Ronald Noble medication. 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 Sleep has been an issue. Ronald Noble goes to sleep well, but wakes up after 45 minutes to 1 hour. Ronald Noble is seeing a urologist. Ronald Noble exercises regularly.   Ronald Noble previously followed with [name] and was last seen by him on 05/25/2012, at which time [name] felt Ronald Noble was stable, but because of off time at 2 hours Ronald Noble increased Ronald Noble carbidopa-levodopa by adding half of a 10-100 mg strength 3 times a day to Ronald Noble regimen. Ronald Noble recommended decreasing Ronald Noble Stalevo to 125 mg from 100 mg 6 times a day. Ronald Noble has been on Stalevo 125 mg 6 times a day, at 7:30, 10, 12:30, 3 PM, 5:30 PM and 8 PM. Ronald Noble is on clonazepam 0.5 mg at night, Requip long-acting 6 mg twice daily, rasagiline 1 mg once daily, carbidopa-levodopa 10-100 mg strength half a tablet 3 times  a day, 7:30 to 10, then midday and later in the evening.   Ronald Noble was followed by [name] since 2008 with a history of Parkinson's disease. Ronald Noble has a history of increased tone and slowness as well as tremor with R sided predominance. Ronald Noble was initially placed on Requip XL and then rasagiline was added in October 2008. In February 2010 Stalevo was added. Ronald Noble had low vitamin D levels in 2008. ANA, ESR, ceruloplasmin were normal except for slightly low ceruloplasmin of 16. Urine copper test was negative. Methylmalonic acid was normal. Urine for heavy metals is normal as well. MRI brain with and without contrast on 01/07/2007 was normal. Ronald Noble had a sleep study in October 2008 showing PLMs and evidence of RBD. Repeat sleep study in September 2011 showed supine obstructive sleep apnea and RBD. Ronald Noble had a CPAP titration study in late September 2011 which was not very successful. Ronald Noble has been on clonazepam at night. Ronald Noble has not noticed any compulsive behaviors, memory loss, depression, swelling, or orthostatic dizziness. Ronald Noble recently had a skin biopsy showing atypical cells, concerning for melanoma.   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 Ronald Noble exercises regularly and does yoga.    Ronald Noble Past Medical History Is Significant For: Past Medical History:  Diagnosis Date  . BPH (benign prostatic hyperplasia)   . Parkinson disease ([place])   . Skin cancer of face    arms and back  .  Sleep apnea    mild  . Spider bite    brown recluse/ left ankle/08/13    Ronald Noble Past Surgical History Is Significant For: Past Surgical History:  Procedure Laterality Date  . BURR HOLE W/ STEREOTACTIC INSERTION OF DBS LEADS / INTRAOP MICROELECTRODE RECORDING  04/2014  . COLONOSCOPY    . KNEE SURGERY     exploratory/left  . SKIN CANCER EXCISION  01/2014   face  . TRANSURETHRAL RESECTION OF PROSTATE     2008    Ronald Noble Family History Is Significant For: Family History  Problem Relation Age of Onset  . COPD Mother   . Cancer  Father        Bladder    Ronald Noble Social History Is Significant For: Social History   Socioeconomic History  . Marital status: Married    Spouse name: Ronald Noble  . Number of children: 4  . Years of education: 52  . Highest education level: None  Social Needs  . Financial resource strain: None  . Food insecurity - worry: None  . Food insecurity - inability: None  . Transportation needs - medical: None  . Transportation needs - non-medical: None  Occupational History  . Occupation: LAWYER    Employer: [employer]  Tobacco Use  . Smoking status: Never Smoker  . Smokeless tobacco: Never Used  Substance and Sexual Activity  . Alcohol use: Yes    Alcohol/week: 3.0 oz    Types: 3 Glasses of wine, 2 Cans of beer per week    Comment: occasional  . Drug use: No  . Sexual activity: None  Other Topics Concern  . None  Social History Narrative   Patient is right handed and resides with wife   1-2 cups of coffee a day     Ronald Noble Allergies Are:  No Known Allergies:   Ronald Noble Current Medications Are:  Outpatient Encounter Medications as of 04/28/2017  Medication Sig  . amantadine (SYMMETREL) 100 MG capsule   . carbidopa-levodopa (SINEMET IR) 25-100 MG tablet Take 1.5 tablets by mouth 5 (five) times daily.  Marland Kitchen desmopressin (DDAVP) 0.2 MG tablet Take 0.6 mg by mouth.  . Ropinirole HCl 6 MG TB24 2 (two) times daily.    No facility-administered encounter medications on file as of 04/28/2017.   :  Review of Systems:  Out of a complete 14 point review of systems, all are reviewed and negative with the exception of these symptoms as listed below: Review of Systems  Neurological:       Pt presents today to follow up. Pt reports that Ronald Noble is doing well.    Objective:  Neurological Exam  Physical Exam Physical Examination:   Vitals:   04/28/17 1409  BP: 127/79  Pulse: 76    General Examination: The patient is a very pleasant 72 y.o. male in no acute distress. Ronald Noble appears  well-developed and well-nourished and well groomed.   HEENT:Normocephalic, atraumatic, pupils are equal, round and reactive to light and accommodation. Extraocular tracking shows mild saccadic breakdown without nystagmus noted. Ronald Noble has some scarring on Ronald Noble nose, but looks well healed. There is no limitation to Ronald Noble gaze, and no significant blepharospasm of the left eye. Ronald Noble has eyeglasses in place, bilateral cataracts noted, neck is moderately rigid.. Oropharynx exam reveals mild mouth dryness. No significant airway crowding is noted. Mallampati is class II. Tongue protrudes centrally and palate elevates symmetrically. There is no drooling. There are no dyskinesias. Ronald Noble stimulator wires are tunneled on the left side.  Chest:is clear to auscultation without wheezing, rhonchi or crackles noted. Ronald Noble generator site is not tender.   Heart:sounds are regular and normal without murmurs, rubs or gallops noted.   Abdomen:is soft, non-tender and non-distended with normal bowel sounds appreciated on auscultation.  Extremities:There is 1+ edema in the distal lower extremities bilaterally.   Skin: is warm and dry with no trophic changes noted. Age-related changes are noted on the skin and scars from skin cancer removals.   Musculoskeletal: exam reveals no obvious joint deformities, tenderness, joint swelling or erythema, with the exception of low back pain reported and right shoulder pain.  Neurologically:  Mental status: The patient is awake and alert, paying good attention. Ronald Noble is able to completely provide the history. Ronald Noble is oriented to: person, place, time/date, situation, day of week, month of year and year. Ronald Noble memory, attention, language and knowledge areappropriate.There is no aphasia, agnosia, apraxia or anomia. There is a mild degree of bradyphrenia. Speech is mild to moderately hypophonic with no dysarthria noted. Mood is congruent and affect is normal.   Cranial nerves are as described  above under HEENT exam. In addition, shoulder shrug is normal with equal shoulder height noted.  Motor exam: Normal bulk, and strength for age is noted. There are no dyskinesias noted today. No resting tremor.  Tone is mildly rigid with presence of cogwheeling in the right upper extremity. There is overall mild to moderate bradykinesia. There is no drift or rebound.  There in an intermittent myoclonus in the right lower extremity.  Romberg is negative, with the exception of mild sway.  Reflexes are 1+ in the upper extremities and 2+ in the lower extremities.  Fine motor skills exam: Fine motor skills are mild to moderately impaired, more pronounced on the R than L, stable. Cerebellar testing shows no dysmetria or intention tremor on finger to nose testing. Heel to shin is unremarkable bilaterally. There is no truncal or gait ataxia.   Sensory exam is intact to light touch throughout.   Gait, station and balance: Ronald Noble stands up from the seated position with no significant difficulty and does not need to push up with Ronald Noble hands. Ronald Noble needs no assistance. No veering to one side is noted. Ronald Noble is noted to lean to the right slightly and posture is slightly worse than last time. Ronald Noble is more stooped. Ronald Noble walks with decreased arm swing bilaterally. Balance is mildly impaired.   Assessment and Plan:   In summary, Ronald Noble is a very pleasant 72 year old male with a history of right-sided predominant Parkinson's disease, complicated by RBD, sleep disturbance, generalized dyskinesias, motor fluctuations, orthostatic hypotension, syncopal spells, falls, constipation and bladder hyperactivity, and now s/p b/l DBS placement at [facility] on 06/01/14 and generator placement on 06/13/2014. Overall, Ronald Noble has done well, but has ongoing issues with low back pain, arthritis in Ronald Noble right shoulder, sleep disturbance. Ronald Noble did have 2 distinct syncopal spells that led to collapse, falls, and injuries. These happened fairly  back to back in the summer of 2016. Ronald Noble sustained Fx of nose when Ronald Noble fell in Ronald Noble office, and then also fell in Ronald Noble driveway and chipped a tooth, needed a crown placed. Ronald Noble had to have nose surgery. Ronald Noble saw a vascular specialist and had a tilt table test at [facility]. Ronald Noble had a24-hour blood pressure monitor test. Ronald Noble  was started on amantadine for facial dyskinesias. Ronald Noble is recently status post Botox injection to the left eye for blepharospasm which improved. Ronald Noble used to be  on Stalevo in the past but was able to take stop this in or around early 2018. Today I suggested Ronald Noble try a lower dose of clonazepam namely 0.25 mg at night for Ronald Noble REM behavior disorder and nighttime sleep disruption. Ronald Noble is advised to continue with Sinemet at the current dose and regimen. I would like for him to try to reduce Ronald Noble amantadine 10 once daily at 8 AM. In addition, I suggested Ronald Noble switch Ronald Noble Requip to 6 PM rather than 2 PM to see if Ronald Noble daytime somnolence improves and Ronald Noble nighttime RLS symptoms and nighttime sleep improve. I suggested a 6 month follow-up, sooner as needed. Answered all their questions today and the patient and Ronald Noble wife were in agreement.  I spent 40 minutes in total face-to-face time with the patient, more than 50% of which was spent in counseling and coordination of care, reviewing test results, reviewing medication and discussing or reviewing the diagnosis of PD, its prognosis and treatment options. Pertinent laboratory and imaging test results that were available during this visit with the patient were reviewed by me and considered in my medical decision making (see chart for details).

## 2017-04-28 NOTE — Patient Instructions (Addendum)
Here are my suggestions for you today:  1. Change Requip XL 6 mg to 6 PM, instead 2 PM.  2. Reduce amantadine 100 mg to once daily only at 8 AM.  3. We may consider taking you off altogether of the amantadine.  4. You can try clonazepam at a lower dose: 0.25 mg at night.  5. Keep the sinemet the same.  6. Follow up in 6 months.

## 2017-05-04 ENCOUNTER — Telehealth: Payer: Self-pay

## 2017-05-04 NOTE — Telephone Encounter (Signed)
Ronald Noble with [employer] calling letting us know pts PA has been approved for clonazepam for one year

## 2017-05-04 NOTE — Telephone Encounter (Signed)
I called pt, informed him that his clonazepam PA was approved. Pt verbalized understanding.  I have also informed [facility].

## 2017-05-04 NOTE — Telephone Encounter (Signed)
I called pt, advised him that his pharmacy informed us that his clonazepam is not covered by his insurance and that I will need to complete a pa for him. Pt says that he is aware of this and knows that I will complete this pa, but it does not guarantee that it will be approved. Pt verbalized understanding.  I completed pa for clonazepam ODT and sent to [facility], key [id number], should have a determination in 1-3 business days.

## 2017-05-10 DIAGNOSIS — N401 Enlarged prostate with lower urinary tract symptoms: Secondary | ICD-10-CM | POA: Diagnosis not present

## 2017-05-10 DIAGNOSIS — R351 Nocturia: Secondary | ICD-10-CM | POA: Diagnosis not present

## 2017-05-10 DIAGNOSIS — R972 Elevated prostate specific antigen [PSA]: Secondary | ICD-10-CM | POA: Diagnosis not present

## 2017-05-10 DIAGNOSIS — N5201 Erectile dysfunction due to arterial insufficiency: Secondary | ICD-10-CM | POA: Diagnosis not present

## 2017-05-24 DIAGNOSIS — H2511 Age-related nuclear cataract, right eye: Secondary | ICD-10-CM | POA: Diagnosis not present

## 2017-05-24 DIAGNOSIS — H25811 Combined forms of age-related cataract, right eye: Secondary | ICD-10-CM | POA: Diagnosis not present

## 2017-06-02 DIAGNOSIS — G20 Parkinson's disease: Secondary | ICD-10-CM | POA: Diagnosis not present

## 2017-06-02 DIAGNOSIS — Z9689 Presence of other specified functional implants: Secondary | ICD-10-CM | POA: Diagnosis not present

## 2017-06-02 DIAGNOSIS — G245 Blepharospasm: Secondary | ICD-10-CM | POA: Diagnosis not present

## 2017-06-14 ENCOUNTER — Encounter: Payer: Medicare Other | Admitting: Gastroenterology

## 2017-06-25 ENCOUNTER — Ambulatory Visit (AMBULATORY_SURGERY_CENTER): Payer: Medicare Other | Admitting: Gastroenterology

## 2017-06-25 ENCOUNTER — Encounter: Payer: Self-pay | Admitting: Gastroenterology

## 2017-06-25 ENCOUNTER — Other Ambulatory Visit: Payer: Self-pay

## 2017-06-25 VITALS — BP 103/56 | HR 57 | Temp 97.7°F | Resp 13 | Ht 71.0 in | Wt 159.0 lb

## 2017-06-25 DIAGNOSIS — G4733 Obstructive sleep apnea (adult) (pediatric): Secondary | ICD-10-CM | POA: Diagnosis not present

## 2017-06-25 DIAGNOSIS — Z1211 Encounter for screening for malignant neoplasm of colon: Secondary | ICD-10-CM | POA: Diagnosis not present

## 2017-06-25 DIAGNOSIS — G20 Parkinson's disease: Secondary | ICD-10-CM | POA: Diagnosis not present

## 2017-06-25 DIAGNOSIS — Z1212 Encounter for screening for malignant neoplasm of rectum: Secondary | ICD-10-CM | POA: Diagnosis not present

## 2017-06-25 MED ORDER — SODIUM CHLORIDE 0.9 % IV SOLN: 500.0000 mL | Freq: Once | INTRAVENOUS | Status: DC

## 2017-06-25 NOTE — Op Note (Signed)
[facility] Patient Name: Ronald Noble Procedure Date: 06/25/2017 10:00 AM MRN: [id number] Endoscopist: Milus Banister , MD Age: 72 Referring MD:  Date of Birth: October 25, 1945 Gender: Male Account #: [id number] Procedure:                Colonoscopy Indications:              Screening for colorectal malignant neoplasm Medicines:                Monitored Anesthesia Care Procedure:                Pre-Anesthesia Assessment:                           - Prior to the procedure, a History and Physical                            was performed, and patient medications and                            allergies were reviewed. The patient's tolerance of                            previous anesthesia was also reviewed. The risks                            and benefits of the procedure and the sedation                            options and risks were discussed with the patient.                            All questions were answered, and informed consent                            was obtained. Prior Anticoagulants: The patient has                            taken no previous anticoagulant or antiplatelet                            agents. ASA Grade Assessment: III - A patient with                            severe systemic disease. After reviewing the risks                            and benefits, the patient was deemed in                            satisfactory condition to undergo the procedure.                           After obtaining informed consent, the colonoscope  was passed under direct vision. Throughout the                            procedure, the patient's blood pressure, pulse, and                            oxygen saturations were monitored continuously. The                            Model CF-HQ190L [id number]) scope was introduced                            through the anus and advanced to the the cecum,                            identified by  appendiceal orifice and ileocecal                            valve. The colonoscopy was technically difficult                            and complex due to a tortuous colon. The patient                            tolerated the procedure well. The quality of the                            bowel preparation was adequate. The ileocecal                            valve, appendiceal orifice, and rectum were                            photographed. Scope In: 10:09:58 AM Scope Out: 10:32:44 AM Scope Withdrawal Time: 0 hours 10 minutes 36 seconds  Total Procedure Duration: 0 hours 22 minutes 46 seconds  Findings:                 The entire examined colon appeared normal on direct                            and retroflexion views. Complications:            No immediate complications. Estimated blood loss:                            None. Estimated Blood Loss:     Estimated blood loss: none. Impression:               - The entire examined colon is normal on direct and                            retroflexion views.                           - No polyps or cancers.  Recommendation:           - Patient has a contact number available for                            emergencies. The signs and symptoms of potential                            delayed complications were discussed with the                            patient. Return to normal activities tomorrow.                            Written discharge instructions were provided to the                            patient.                           - Resume previous diet.                           - Continue present medications.                           You do not need any further colon cancer screening                            tests (including annual stool testing). These types                            of tests generally stop around age 22-80. Milus Banister, MD 06/25/2017 10:36:00 AM This report has been signed electronically.

## 2017-06-25 NOTE — Progress Notes (Signed)
Report to PACU, RN, vss, BBS= Clear.  

## 2017-06-25 NOTE — Patient Instructions (Signed)
YOU HAD AN ENDOSCOPIC PROCEDURE TODAY AT THE [facility]:   Refer to the procedure report that was given to you for any specific questions about what was found during the examination.  If the procedure report does not answer your questions, please call your gastroenterologist to clarify.  If you requested that your care partner not be given the details of your procedure findings, then the procedure report has been included in a sealed envelope for you to review at your convenience later.  YOU SHOULD EXPECT: Some feelings of bloating in the abdomen. Passage of more gas than usual.  Walking can help get rid of the air that was put into your GI tract during the procedure and reduce the bloating. If you had a lower endoscopy (such as a colonoscopy or flexible sigmoidoscopy) you may notice spotting of blood in your stool or on the toilet paper. If you underwent a bowel prep for your procedure, you may not have a normal bowel movement for a few days.  Please Note:  You might notice some irritation and congestion in your nose or some drainage.  This is from the oxygen used during your procedure.  There is no need for concern and it should clear up in a day or so.  SYMPTOMS TO REPORT IMMEDIATELY:   Following lower endoscopy (colonoscopy or flexible sigmoidoscopy):  Excessive amounts of blood in the stool  Significant tenderness or worsening of abdominal pains  Swelling of the abdomen that is new, acute  Fever of 100F or higher  For urgent or emergent issues, a gastroenterologist can be reached at any hour by calling [phone].   DIET:  We do recommend a small meal at first, but then you may proceed to your regular diet.  Drink plenty of fluids but you should avoid alcoholic beverages for 24 hours.  ACTIVITY:  You should plan to take it easy for the rest of today and you should NOT DRIVE or use heavy machinery until tomorrow (because of the sedation medicines used during the test).     FOLLOW UP: Our staff will call the number listed on your records the next business day following your procedure to check on you and address any questions or concerns that you may have regarding the information given to you following your procedure. If we do not reach you, we will leave a message.  However, if you are feeling well and you are not experiencing any problems, there is no need to return our call.  We will assume that you have returned to your regular daily activities without incident.  If any biopsies were taken you will be contacted by phone or by letter within the next 1-3 weeks.  Please call us at [phone] if you have not heard about the biopsies in 3 weeks.    SIGNATURES/CONFIDENTIALITY: You and/or your care partner have signed paperwork which will be entered into your electronic medical record.  These signatures attest to the fact that that the information above on your After Visit Summary has been reviewed and is understood.  Full responsibility of the confidentiality of this discharge information lies with you and/or your care-partner. 

## 2017-06-28 ENCOUNTER — Telehealth: Payer: Self-pay

## 2017-06-28 ENCOUNTER — Telehealth: Payer: Self-pay | Admitting: *Deleted

## 2017-06-28 NOTE — Telephone Encounter (Signed)
No answer for post procedure call back. Will attempt to call back later this afternoon. SM 

## 2017-06-28 NOTE — Telephone Encounter (Signed)
  Follow up Call-  Call back number 06/25/2017  Post procedure Call Back phone  # [phone]  Permission to leave phone message Yes  Some recent data might be hidden     Patient questions:  Do you have a fever, pain , or abdominal swelling? No. Pain Score  0 *  Have you tolerated food without any problems? Yes.    Have you been able to return to your normal activities? Yes.    Do you have any questions about your discharge instructions: Diet   No. Medications  No. Follow up visit  No.  Do you have questions or concerns about your Care? No.  Actions: * If pain score is 4 or above: No action needed, pain <4.

## 2017-06-29 DIAGNOSIS — H0014 Chalazion left upper eyelid: Secondary | ICD-10-CM | POA: Diagnosis not present

## 2017-07-19 DIAGNOSIS — H25812 Combined forms of age-related cataract, left eye: Secondary | ICD-10-CM | POA: Diagnosis not present

## 2017-07-19 DIAGNOSIS — H2512 Age-related nuclear cataract, left eye: Secondary | ICD-10-CM | POA: Diagnosis not present

## 2017-07-27 ENCOUNTER — Ambulatory Visit: Payer: Medicare Other | Admitting: Occupational Therapy

## 2017-07-27 ENCOUNTER — Ambulatory Visit: Payer: Medicare Other | Admitting: Physical Therapy

## 2017-07-27 ENCOUNTER — Telehealth: Payer: Self-pay | Admitting: Neurology

## 2017-07-27 ENCOUNTER — Ambulatory Visit: Payer: Medicare Other | Attending: Internal Medicine

## 2017-07-27 ENCOUNTER — Telehealth: Payer: Self-pay | Admitting: Physical Therapy

## 2017-07-27 DIAGNOSIS — R2689 Other abnormalities of gait and mobility: Secondary | ICD-10-CM

## 2017-07-27 DIAGNOSIS — R471 Dysarthria and anarthria: Secondary | ICD-10-CM | POA: Insufficient documentation

## 2017-07-27 DIAGNOSIS — R278 Other lack of coordination: Secondary | ICD-10-CM | POA: Insufficient documentation

## 2017-07-27 DIAGNOSIS — G20 Parkinson's disease: Secondary | ICD-10-CM

## 2017-07-27 DIAGNOSIS — R41841 Cognitive communication deficit: Secondary | ICD-10-CM | POA: Insufficient documentation

## 2017-07-27 NOTE — Telephone Encounter (Signed)
Please refer to neuro rehabilitation as per recommendations from recent screening visits.

## 2017-07-27 NOTE — Therapy (Signed)
[facility] [address] [place], [name], [address] Phone: [phone]   Fax:  [phone]  Patient Details  Name: Ronald Noble MRN: [id number] Date of Birth: [birth year] Referring Provider:  [name], MD  Encounter Date: 07/27/2017  Physical Therapy Parkinson's Disease Screen   Timed Up and Go test: 10.56 sec  10 meter walk test:10.53 sec (3.11 ft/sec)  5 time sit to stand test: 10.07 sec  Patient would benefit from Physical Therapy evaluation due to slightly slowed mobility measures and pt's new reports of lurching forward, going too fast with unlevel, hill surfaces.       Laruen Risser W. 07/27/2017, 1:35 PM  Frazier Butt., PT   [facility] [address] [place], [name], [address] Phone: [phone]   Fax:  [phone]

## 2017-07-27 NOTE — Therapy (Signed)
[facility] [address] [place], [name], [address] Phone: [phone]   Fax:  [phone]  Patient Details  Name: Graydon Fofana MRN: [id number] Date of Birth: 09-Oct-1945 Referring Provider:  [name], MD  Encounter Date: 07/27/2017 Occupational Therapy Parkinson's Disease Screen  Hand dominance: RUE  Fastening/unfastening 3 buttons in: 1 min 53secs sec  9-hole peg test:    RUE  50.65 sec        LUE  56.78 sec  Change in ability to perform ADLs/IADLs:  Yes increased time required  Other Comments:  Handwriting is legible and good size.  Pt would benefit from occupational therapy evaluation due to  Decline in fine motor coordination    RINE,KATHRYN 07/27/2017, 1:20 PM  [facility] [address] [place], [name], [address] Phone: [phone]   Fax:  [phone]

## 2017-07-27 NOTE — Therapy (Signed)
[facility] [address] [place], [name], [address] Phone: [phone]   Fax:  [phone]  Patient Details  Name: Equan Cogbill MRN: [id number] Date of Birth: 1945/11/14 Referring Provider:  Star Age, MD  Encounter Date: 07/27/2017  Speech Therapy Parkinson's Disease Screen   Decibel Level today: mid 60s dB  (WNL=70-72 dB) with 10 minutes simple to mod complex conversation, as measured with a sound level meter 30cm away from pt's mouth. Pt's loudness decr'd over the course of the conversation. Pt's articulation was more a problem today than in the past, with short rushes of speech making pt's message more difficult to understand with this trained listener. Pt's conversational volume has decreased since last treatment course. "If my wife were here she'd say she can't hear me as well as she could before."  Pt would benefit from speech-language eval for dysarthria. If agreed, please order via Epic.   [facility] ,[place], [place]  07/27/2017, 1:47 PM  [facility] [address] [place], [name], [address] Phone: [phone]   Fax:  [phone]

## 2017-07-27 NOTE — Telephone Encounter (Signed)
Dr. Rexene Alberts, Ronald Noble was seen for PT, OT, and speech therapy screens today, and all disciplines recommend return to therapy:  PT for slowed mobility scores and newer onset reports of hastening with gait, OT for decreased coordination, speech for decreased voice volume.  If you agree, could you please send orders via Epic for PT, OT, and speech therapy evals?  Thank you, Mady Haagensen, PT 07/27/17 4:47 PM Phone: [phone] Fax: [phone]

## 2017-07-28 NOTE — Telephone Encounter (Signed)
Referral has been sent.

## 2017-08-25 DIAGNOSIS — G20 Parkinson's disease: Secondary | ICD-10-CM | POA: Diagnosis not present

## 2017-08-25 DIAGNOSIS — G4752 REM sleep behavior disorder: Secondary | ICD-10-CM | POA: Diagnosis not present

## 2017-08-25 DIAGNOSIS — Z79899 Other long term (current) drug therapy: Secondary | ICD-10-CM | POA: Diagnosis not present

## 2017-08-25 DIAGNOSIS — Z9689 Presence of other specified functional implants: Secondary | ICD-10-CM | POA: Diagnosis not present

## 2017-08-25 DIAGNOSIS — G245 Blepharospasm: Secondary | ICD-10-CM | POA: Diagnosis not present

## 2017-09-08 ENCOUNTER — Ambulatory Visit: Payer: Medicare Other | Attending: Internal Medicine | Admitting: Occupational Therapy

## 2017-09-08 ENCOUNTER — Ambulatory Visit: Payer: Medicare Other | Admitting: Physical Therapy

## 2017-09-08 ENCOUNTER — Encounter: Payer: Self-pay | Admitting: Physical Therapy

## 2017-09-08 DIAGNOSIS — R278 Other lack of coordination: Secondary | ICD-10-CM | POA: Diagnosis not present

## 2017-09-08 DIAGNOSIS — R29898 Other symptoms and signs involving the musculoskeletal system: Secondary | ICD-10-CM

## 2017-09-08 DIAGNOSIS — R293 Abnormal posture: Secondary | ICD-10-CM | POA: Diagnosis not present

## 2017-09-08 DIAGNOSIS — R41840 Attention and concentration deficit: Secondary | ICD-10-CM

## 2017-09-08 DIAGNOSIS — R471 Dysarthria and anarthria: Secondary | ICD-10-CM | POA: Insufficient documentation

## 2017-09-08 DIAGNOSIS — R29818 Other symptoms and signs involving the nervous system: Secondary | ICD-10-CM | POA: Diagnosis not present

## 2017-09-08 DIAGNOSIS — R2689 Other abnormalities of gait and mobility: Secondary | ICD-10-CM | POA: Diagnosis not present

## 2017-09-08 DIAGNOSIS — R2681 Unsteadiness on feet: Secondary | ICD-10-CM | POA: Insufficient documentation

## 2017-09-08 DIAGNOSIS — M25611 Stiffness of right shoulder, not elsewhere classified: Secondary | ICD-10-CM | POA: Insufficient documentation

## 2017-09-08 DIAGNOSIS — R41841 Cognitive communication deficit: Secondary | ICD-10-CM | POA: Insufficient documentation

## 2017-09-08 DIAGNOSIS — M25612 Stiffness of left shoulder, not elsewhere classified: Secondary | ICD-10-CM | POA: Insufficient documentation

## 2017-09-08 NOTE — Therapy (Signed)
[facility] [address] [place], [name], [address] Phone: [phone]   Fax:  [phone]  Occupational Therapy Evaluation  Patient Details  Name: Ronald Noble MRN: [id number] Date of Birth: 27-Apr-1945 Referring Provider: Rexene Alberts   Encounter Date: 09/08/2017  OT End of Session - 09/08/17 0913    Visit Number  1    Number of Visits  17    Date for OT Re-Evaluation  11/07/17    Authorization Type  Medicare    OT Start Time  0806    OT Stop Time  0845    OT Time Calculation (min)  39 min    Activity Tolerance  Patient tolerated treatment well    Behavior During Therapy  [facility] for tasks assessed/performed       Past Medical History:  Diagnosis Date  . BPH (benign prostatic hyperplasia)   . Parkinson disease ([place])   . Skin cancer of face    arms and back  . Sleep apnea    mild  . Spider bite    brown recluse/ left ankle/08/13    Past Surgical History:  Procedure Laterality Date  . BURR HOLE W/ STEREOTACTIC INSERTION OF DBS LEADS / INTRAOP MICROELECTRODE RECORDING  04/2014  . COLONOSCOPY    . KNEE SURGERY     exploratory/left  . SKIN CANCER EXCISION  01/2014   face  . TRANSURETHRAL RESECTION OF PROSTATE     2008    There were no vitals filed for this visit.  Subjective Assessment - 09/08/17 0815    Patient Stated Goals  improve coordination    Currently in Pain?  No/denies        [facility] OT Assessment - 09/08/17 0816      Assessment   Medical Diagnosis  Parkinson's disease    Referring Provider  Dr. Rexene Alberts    Onset Date/Surgical Date  08/26/17 referral date    Hand Dominance  Right    Prior Therapy  OT, PT, ST      Precautions   Precautions  Fall      Balance Screen   Has the patient fallen in the past 6 months  Yes    How many times?  1    Has the patient had a decrease in activity level because of a fear of falling?   No    Is the patient reluctant to leave their home because of a  fear of falling?   No      Home  Environment   Family/patient expects to be discharged to:  Private residence    [place] to live on main level with bedroom/bathroom    Lives With  Spouse      Prior Function   Level of Independence  Independent    Vocation  Full time employment    Vocation Requirements  attorney    Leisure  works out regularly,  Pt was cautioned regarding use of weights and malpositioning      ADL   Eating/Feeding  Modified independent increased spills    Grooming  Modified independent    Upper Body Bathing  Modified independent    Lower Body Bathing  Modified independent    Upper Body Dressing  Increased time;Independent difficulty withbuttons    Lower Body Dressing  Modified independent difficulty with fastening buttons on jeans    ADL comments  Pt reports increased time for ADLs, difficulties with fine motor  coordination      IADL   Light Housekeeping  -- Pt reports perfroming yardwork       Mobility   Mobility Status  Independent      Written Expression   Dominant Hand  Right    Handwriting  Mild micrographia;Increased time able to write legibly if he slows down, 90-100% legible      Vision - History   Visual History  Cataracts      Cognition   Overall Cognitive Status  Impaired/Different from baseline    Memory  Impaired    Memory Impairment  Decreased short term memory    Cognition Comments  impulsive, decreased safety awareness      Observation/Other Assessments   Standing Functional Reach Test  RUE 9.5 inches, LUE 11.5 inches    Simulated Eating Time (seconds)  9.75 secs    Donning Doffing Jacket Time (seconds)  16.10 secs    Donning Doffing Jacket Comments  3 button/ unbutton: 1 min 18 secs      Posture/Postural Control   Posture/Postural Control  Postural limitations    Postural Limitations  Rounded Shoulders      Coordination   9 Hole Peg Test  Right;Left    Right 9 Hole Peg Test  42.85    Left 9 Hole  Peg Test  48.25    Box and Blocks  RUE 56 blocks, LUE 52 blocks      ROM / Strength   AROM / PROM / Strength  AROM;Strength      AROM   Overall AROM   Deficits    Overall AROM Comments  bilateral shoulder flexion 135, right elbow extension -25, left elbow extension -15                        OT Short Term Goals - 09/08/17 0914      OT SHORT TERM GOAL #1   Title  Pt will be independent with updated PD specific HEP--10/08/17    Time  4    Period  Weeks    Status  New    Target Date  10/08/17      OT SHORT TERM GOAL #2   Title  Pt will improve L hand coordination for ADLs as shown by improving time on 9-hole peg test by at least 4sec with LUE.    Baseline  L 48.25    Time  4    Period  Weeks    Status  New      OT SHORT TERM GOAL #3   Title  Pt will verbalize understanding of memory compensations and keeping thinking skills sharp    Time  4    Period  Weeks    Status  New      OT SHORT TERM GOAL #4   Title  Pt will be able to fasten/unfasten 3 button in less than 70 sec (for improved ability to dress and improved bilateral hand coordination).    Baseline  78 secs    Time  4    Period  Weeks    Status  New      OT SHORT TERM GOAL #5   Title  Pt will verbalize understanding of updated community resources prn.    Time  4    Period  Weeks    Status  New        OT Long Term Goals - 09/08/17 0921      OT LONG TERM  GOAL #1   Title  Pt will verbalize understanding of adaptive strategies to incr ease with ADL's/IADLS's--LTGs due 08/04/68    Time  8    Period  Weeks    Status  New    Target Date  11/07/17      OT LONG TERM GOAL #2   Title  Pt will demonstrate improved standing functional reach for ADLS by improving standing functional reach to 11 inches or greater with RUE    Baseline  RUE 9.5, LUE 11.5    Time  8    Period  Weeks    Status  New      OT LONG TERM GOAL #3   Title  Pt will verbalize understanding of community exercise recommendations  and ways to prevent future PD related complications.    Time  8    Period  Weeks    Status  New      OT LONG TERM GOAL #4   Title  Pt will demonstrate ability to retrieve a lightweight object at 135 shoulder flexion with -20 elbow ext. with RUE.    Time  8    Period  Weeks    Status  New            Plan - 09/08/17 0943    Clinical Impression Statement  Pt is a 72 y.o male diagnosed with PD in 2008, s/p bilateral DBS who reports a decline in fine motor coordination since he was last seen by occupational therapy. Pt presents with the following deficits: bradykinesia, decreased coordination rigidity, decreased balance and cognitive deficits which impede performance of ADLs/IADLS. Pt can benefit from skilled occupational therapy to maximize pt's safety and indpendence with daily activities.      Occupational Profile and client history currently impacting functional performance  Pt works full time as an Forensic psychologist, he reports decreased dexterity and difficulty with handwritiing PMH: PD diagnosed in 2008, s/p bilateral DBS, skin cancer, sleep apnea, BPH, TURP, orthostatic hypotension, syncopal episodes; arthritic changes in spine    Occupational performance deficits (Please refer to evaluation for details):  ADL's;IADL's;Work;Leisure;Social Participation    Rehab Potential  Good    Current Impairments/barriers affecting progress:  length of time since onset, cognitive deficits    OT Frequency  2x / week plus eval, may wrap up sooner dependent on progress    OT Duration  8 weeks    OT Treatment/Interventions  Self-care/ADL training;Therapeutic exercise;Patient/family education;Moist Heat;Energy conservation;Therapist, nutritional;Therapeutic activities;Balance training;Cognitive remediation/compensation;Passive range of motion;Manual Therapy;DME and/or AE instruction;Cryotherapy;Fluidtherapy    Plan  fine motor coordination HEP    Clinical Decision Making  Limited treatment options, no task  modification necessary    Consulted and Agree with Plan of Care  Patient       Patient will benefit from skilled therapeutic intervention in order to improve the following deficits and impairments:  Abnormal gait, Decreased cognition, Impaired flexibility, Pain, Decreased mobility, Decreased coordination, Decreased endurance, Decreased range of motion, Decreased strength, Impaired UE functional use, Impaired perceived functional ability, Difficulty walking, Decreased safety awareness, Decreased knowledge of precautions, Decreased balance  Visit Diagnosis: Other lack of coordination - Plan: Ot plan of care cert/re-cert  Other symptoms and signs involving the nervous system - Plan: Ot plan of care cert/re-cert  Other symptoms and signs involving the musculoskeletal system - Plan: Ot plan of care cert/re-cert  Attention and concentration deficit - Plan: Ot plan of care cert/re-cert    Problem List Patient Active Problem List  Diagnosis Date Noted  . Parkinson disease ([place])     RINE,KATHRYN 09/08/2017, 10:07 AM  [facility] [address] [place], [name], [address] Phone: [phone]   Fax:  [phone]  Name: Ronald Noble MRN: [id number] Date of Birth: Jan 12, 1946

## 2017-09-09 NOTE — Therapy (Signed)
[facility] [address] [place], [name], [address] Phone: [phone]   Fax:  [phone]  Physical Therapy Evaluation  Patient Details  Name: Ronald Noble MRN: [id number] Date of Birth: 03-21-1946 Referring Provider: Rexene Alberts   Encounter Date: 09/08/2017  PT End of Session - 09/09/17 1303    Visit Number  1    Number of Visits  9    Date for PT Re-Evaluation  11/08/17    Authorization Type  Medicare/BCBS    PT Start Time  0850    PT Stop Time  0930    PT Time Calculation (min)  40 min    Activity Tolerance  Patient tolerated treatment well    Behavior During Therapy  [facility] for tasks assessed/performed       Past Medical History:  Diagnosis Date  . BPH (benign prostatic hyperplasia)   . Parkinson disease ([place])   . Skin cancer of face    arms and back  . Sleep apnea    mild  . Spider bite    brown recluse/ left ankle/08/13    Past Surgical History:  Procedure Laterality Date  . BURR HOLE W/ STEREOTACTIC INSERTION OF DBS LEADS / INTRAOP MICROELECTRODE RECORDING  04/2014  . COLONOSCOPY    . KNEE SURGERY     exploratory/left  . SKIN CANCER EXCISION  01/2014   face  . TRANSURETHRAL RESECTION OF PROSTATE     2008    There were no vitals filed for this visit.   Subjective Assessment - 09/08/17 0855    Subjective  Feel like I'm leaning some to the right when I'm sitting and standing; also, feel like I'm going too fast with walking at times.    Patient Stated Goals  Pt's goals for therapy to improve balance and try to get rid of the forward lean, improve the postural lean to the right.    Currently in Pain?  No/denies         [facility] PT Assessment - 09/08/17 0857      Assessment   Medical Diagnosis  Parkinson's disease    Referring Provider  Athar    Onset Date/Surgical Date  07/27/17 PD screen    Prior Therapy  OT, PT, ST      Precautions   Precautions  Fall      Balance Screen   Has the  patient fallen in the past 6 months  Yes    How many times?  3    Has the patient had a decrease in activity level because of a fear of falling?   No    Is the patient reluctant to leave their home because of a fear of falling?   No      Home Environment   Living Environment  Private residence    Living Arrangements  Spouse/significant other    Available Help at Discharge  Family    Type of [place] to enter    Entrance Stairs-Number of Steps  [place]  One level      Prior Function   Level of Independence  Independent    Vocation  Full time employment    Vocation Requirements  attorney    Leisure  Enjoys yardwork-weedeating, chainsaw, yard tools, cleans gutters at home (reports when carrying things, he tends to lurch forward more); stationary bike, stretches and weights at  gym       Observation/Other Assessments   Observations  Per report:  with off-times of medications-notices increased rigidity, decreased control    Focus on Therapeutic Outcomes (FOTO)   NA      Posture/Postural Control   Posture/Postural Control  Postural limitations    Postural Limitations  Forward head;Weight shift right;Rounded Shoulders      Transfers   Transfers  Sit to Stand;Stand to Sit    Sit to Stand  7: Independent    Five time sit to stand comments   9.78    Stand to Sit  7: Independent      Ambulation/Gait   Ambulation/Gait  Yes    Ambulation/Gait Assistance  7: Independent    Ambulation Distance (Feet)  1328 Feet 6 min walk test    Assistive device  None    Gait Pattern  Step-through pattern;Decreased arm swing - right;Decreased arm swing - left;Trunk flexed;Poor foot clearance - left;Poor foot clearance - right    Ambulation Surface  Level;Indoor    Gait velocity  7.93 sec = 4.14 ft/sec      6 Minute walk- Post Test   6 Minute Walk Post Test  yes      6 minute walk test results    Aerobic Endurance Distance Walked   1328    Endurance additional comments  1st minute:  220 ft, 6th minute:  218 ft, slightly increased forward flexed posture, decreased arm swing and decreased foot clearance at times.  Did not note hastening with gait, as pt describes at times.      Standardized Balance Assessment   Standardized Balance Assessment  Timed Up and Go Test      Timed Up and Go Test   Normal TUG (seconds)  9.88    Manual TUG (seconds)  9.19    Cognitive TUG (seconds)  10.19    TUG Comments  Scores >13.5 sec indicates increased fall risk.      High Level Balance   High Level Balance Comments  MiniBESTest:  23/28 (decreased from 26/28 at last eval discharge).  Difficulty with SLS, stepping strategies forward and backd directions                Objective measurements completed on examination: See above findings.                   PT Long Term Goals - 09/09/17 1517      PT LONG TERM GOAL #1   Title  Pt will verbalize understanding of fall prevention in home environment.  TARGET 10/08/17    Time  5    Period  Weeks    Status  New    Target Date  10/08/17      PT LONG TERM GOAL #2   Title  Pt will improve MiniBESTest score to at least 25/28 for decreased fall risk.    Time  4    Period  Weeks    Status  New    Target Date  10/08/17      PT LONG TERM GOAL #3   Title  Pt will verbalize understanding of techniques to reduce festination/freezing of gait.    Time  5    Period  Weeks    Status  New    Target Date  10/08/17      PT LONG TERM GOAL #4   Title  Pt will be independent with progression of HEP to address balance, posture, and gait.  Time  4    Period  Weeks    Status  New    Target Date  10/08/17             Plan - 09/09/17 1509    Clinical Impression Statement  Pt is a 72 year old male with history of Parkinson's disease and deep brain stimulator placement who was referred to PT based on recent PD screen in April 2019.  Pt has slowed mobility measures  slightly since last bout of therapy, decreased MiniBESTest scores, and pt-reported changes in posture, hastening with gait episodes.  While he performed well on mobility measures during eval, he was in "on-period" with medication, and does note he has more difficulty with off-periods in his medication cycle.  Pt will benefit from skilled PT to address updates to HEP to address posture, balance, hastening/forward flexed postural episodes with gait.    History and Personal Factors relevant to plan of care:  PMH includes PD, deep brain stimulator placement    Clinical Presentation  Stable    Clinical Presentation due to:  postural changes, slight decline in functional mobility measures at PD screen 07/27/17    Clinical Decision Making  Low    Rehab Potential  Good    PT Frequency  2x / week    PT Duration  4 weeks plus eval    PT Treatment/Interventions  ADLs/Self Care Home Management;Balance training;Therapeutic exercise;Therapeutic activities;Functional mobility training;Gait training;Neuromuscular re-education;Patient/family education    PT Next Visit Plan  Initiate HEP to include PWR! Moves-posture, tips to reduce freezing with gait; gait with carrying tasks (simulating yard equipment)-educate on safety awareness    Consulted and Agree with Plan of Care  Patient       Patient will benefit from skilled therapeutic intervention in order to improve the following deficits and impairments:  Abnormal gait, Decreased balance, Decreased mobility, Difficulty walking, Postural dysfunction  Visit Diagnosis: Other abnormalities of gait and mobility  Abnormal posture  Other symptoms and signs involving the nervous system  Unsteadiness on feet     Problem List Patient Active Problem List   Diagnosis Date Noted  . Parkinson disease ([place])     Moe Graca W. 09/09/2017, 3:19 PM Frazier Butt., PT  [facility] [address] [place], [name], [address] Phone: [phone]   Fax:  [phone]  Name: Ronald Noble MRN: [id number] Date of Birth: [birth year]

## 2017-09-10 ENCOUNTER — Ambulatory Visit: Payer: Medicare Other | Admitting: Physical Therapy

## 2017-09-10 ENCOUNTER — Ambulatory Visit: Payer: Medicare Other

## 2017-09-10 ENCOUNTER — Encounter: Payer: Self-pay | Admitting: Physical Therapy

## 2017-09-10 ENCOUNTER — Ambulatory Visit: Payer: Medicare Other | Admitting: Occupational Therapy

## 2017-09-10 DIAGNOSIS — R2681 Unsteadiness on feet: Secondary | ICD-10-CM

## 2017-09-10 DIAGNOSIS — R41840 Attention and concentration deficit: Secondary | ICD-10-CM

## 2017-09-10 DIAGNOSIS — R29898 Other symptoms and signs involving the musculoskeletal system: Secondary | ICD-10-CM | POA: Diagnosis not present

## 2017-09-10 DIAGNOSIS — R471 Dysarthria and anarthria: Secondary | ICD-10-CM

## 2017-09-10 DIAGNOSIS — R278 Other lack of coordination: Secondary | ICD-10-CM | POA: Diagnosis not present

## 2017-09-10 DIAGNOSIS — R41841 Cognitive communication deficit: Secondary | ICD-10-CM

## 2017-09-10 DIAGNOSIS — R293 Abnormal posture: Secondary | ICD-10-CM

## 2017-09-10 DIAGNOSIS — R29818 Other symptoms and signs involving the nervous system: Secondary | ICD-10-CM | POA: Diagnosis not present

## 2017-09-10 NOTE — Patient Instructions (Signed)
Coordination Exercises ° °Perform the following exercises for 20 minutes 1 times per day. Perform with both hand(s). Perform using big movements. ° °· Flipping Cards: Place deck of cards on the table. Flip cards over by opening your hand big to grasp and then turn your palm up big. °· Deal cards: Hold 1/2 or whole deck in your hand. Use thumb to push card off top of deck with one big push. °· Rotate ball with fingertips: Pick up with fingers/thumb and move as much as you can with each turn/movement (clockwise and counter-clockwise). °· Toss ball from one hand to the other: Toss big/high. °· Toss ball in the air and catch with the same hand: Toss big/high. °· Pick up coins and stack one at a time: Pick up with big, intentional movements. Do not drag coin to the edge. (5-10 in a stack) °· Pick up 5-10 coins one at a time and hold in palm. Then, move coins from palm to fingertips one at time and place in coin bank/container. °· Practice writing: Slow down, write big, and focus on forming each letter. °· Perform "Flicks"/hand stretches (PWR! Hands): Close hands then flick out your fingers with focus on opening hands, pulling wrists back, and extending elbows like you are pushing. °

## 2017-09-10 NOTE — Patient Instructions (Signed)
Access Code: [email or web address]  URL: [email or web address]  Date: 09/10/2017  Prepared by: Barry Brunner   Exercises  Supine Chin Tuck - 3 reps - 1 sets - 30 hold - 2x daily - 7x weekly

## 2017-09-10 NOTE — Therapy (Signed)
[facility] [address] [place], [name], [address] Phone: [phone]   Fax:  [phone]  Physical Therapy Treatment  Patient Details  Name: Ronald Noble MRN: [id number] Date of Birth: Mar 12, 1946 Referring Provider: Rexene Alberts   Encounter Date: 09/10/2017  PT End of Session - 09/10/17 1913    Visit Number  2    Number of Visits  9    Date for PT Re-Evaluation  11/08/17    Authorization Type  Medicare/BCBS    PT Start Time  0935 late arrival from [facility]    PT Stop Time  1014    PT Time Calculation (min)  39 min    Activity Tolerance  Patient tolerated treatment well    Behavior During Therapy  [facility] for tasks assessed/performed       Past Medical History:  Diagnosis Date  . BPH (benign prostatic hyperplasia)   . Parkinson disease ([place])   . Skin cancer of face    arms and back  . Sleep apnea    mild  . Spider bite    brown recluse/ left ankle/08/13    Past Surgical History:  Procedure Laterality Date  . BURR HOLE W/ STEREOTACTIC INSERTION OF DBS LEADS / INTRAOP MICROELECTRODE RECORDING  04/2014  . COLONOSCOPY    . KNEE SURGERY     exploratory/left  . SKIN CANCER EXCISION  01/2014   face  . TRANSURETHRAL RESECTION OF PROSTATE     2008    There were no vitals filed for this visit.  Subjective Assessment - 09/10/17 0938    Subjective  Workout 4-5 x/wk; still doing PWR! including supine, prone, modified quadruped, standing (some of each position ~5 reps of each). I find I am leaning more and more to my right (in sitting, standing, walking)    Patient Stated Goals  Pt's goals for therapy to improve balance and try to get rid of the forward lean, improve the postural lean to the right.    Currently in Pain?  No/denies       Treatment-  Gait training-vc for correct timing of UE swing, chin up and looking straight ahead; pre-gait in // bars with slow, exaggerated slow walking to emphasize UE swing; total  ambuation supervision x 600 ft  Neuro re-ed- PWR Rock seated with vc for slow prolonged stretch x 10 reps  There-ex-supine chin tuck with arms abducted to 90 and lying on the mat for anterior chest stretch; also with folded towel under base of skull.                         PT Education - 09/10/17 1910    Education Details  see addition to HEP for posture; need to prioritize seated PWR! Rock for rt sided stretch; should move your "favorite seat" from the right end of the couch encouraging lean on rt armrest to sit at teh left end of sofa    Person(s) Educated  Patient    Methods  Explanation;Demonstration;Handout    Comprehension  Need further instruction;Verbal cues required;Returned demonstration;Verbalized understanding          PT Long Term Goals - 09/09/17 1517      PT LONG TERM GOAL #1   Title  Pt will verbalize understanding of fall prevention in home environment.  TARGET 10/08/17    Time  5    Period  Weeks    Status  New    Target Date  10/08/17      PT LONG TERM GOAL #2   Title  Pt will improve MiniBESTest score to at least 25/28 for decreased fall risk.    Time  4    Period  Weeks    Status  New    Target Date  10/08/17      PT LONG TERM GOAL #3   Title  Pt will verbalize understanding of techniques to reduce festination/freezing of gait.    Time  5    Period  Weeks    Status  New    Target Date  10/08/17      PT LONG TERM GOAL #4   Title  Pt will be independent with progression of HEP to address balance, posture, and gait.    Time  4    Period  Weeks    Status  New    Target Date  10/08/17            Plan - 09/10/17 1916    Clinical Impression Statement  Pt inquired re: what he can do to correct his right lean in sitting, standing, and walking. Patient educated on midline, upright posture with stretches provided to lengthen rt flank. Patient with difficulty maintaining corrections to his posture while ambulating and requires very  frequent cuing. Patient asked to bring his HEP notebook from prior therapy sessions.     Rehab Potential  Good    PT Frequency  2x / week    PT Duration  4 weeks plus eval    PT Treatment/Interventions  ADLs/Self Care Home Management;Balance training;Therapeutic exercise;Therapeutic activities;Functional mobility training;Gait training;Neuromuscular re-education;Patient/family education    PT Next Visit Plan  continue to add to HEP to include PWR! Moves-posture, tips to reduce freezing with gait; gait with carrying tasks (simulating yard equipment)-educate on safety awareness    Consulted and Agree with Plan of Care  Patient       Patient will benefit from skilled therapeutic intervention in order to improve the following deficits and impairments:  Abnormal gait, Decreased balance, Decreased mobility, Difficulty walking, Postural dysfunction  Visit Diagnosis: Abnormal posture  Other symptoms and signs involving the nervous system  Unsteadiness on feet     Problem List Patient Active Problem List   Diagnosis Date Noted  . Parkinson disease ([place])     Rexanne Mano, PT 09/10/2017, 7:21 PM  [facility] [address] [place], [name], [address] Phone: [phone]   Fax:  [phone]  Name: Diogenes Whirley MRN: [id number] Date of Birth: 14-Jan-1946

## 2017-09-10 NOTE — Therapy (Signed)
[facility] [address] [place], [name], [address] Phone: [phone]   Fax:  [phone]  Occupational Therapy Treatment  Patient Details  Name: Ronald Noble MRN: [id number] Date of Birth: [birth year] Referring Provider: Rexene Alberts   Encounter Date: 09/10/2017  OT End of Session - 09/10/17 0827    Visit Number  2    Number of Visits  17    Date for OT Re-Evaluation  11/07/17    Authorization Type  Medicare    OT Start Time  0804    OT Stop Time  0845    OT Time Calculation (min)  41 min    Activity Tolerance  Patient tolerated treatment well    Behavior During Therapy  [facility] for tasks assessed/performed       Past Medical History:  Diagnosis Date  . BPH (benign prostatic hyperplasia)   . Parkinson disease ([place])   . Skin cancer of face    arms and back  . Sleep apnea    mild  . Spider bite    brown recluse/ left ankle/08/13    Past Surgical History:  Procedure Laterality Date  . BURR HOLE W/ STEREOTACTIC INSERTION OF DBS LEADS / INTRAOP MICROELECTRODE RECORDING  04/2014  . COLONOSCOPY    . KNEE SURGERY     exploratory/left  . SKIN CANCER EXCISION  01/2014   face  . TRANSURETHRAL RESECTION OF PROSTATE     2008    There were no vitals filed for this visit.  Subjective Assessment - 09/10/17 0820    Pertinent History  PD diagnosed in 2008, DBS,     Patient Stated Goals  improve coordination    Currently in Pain?  Yes    Pain Score  2     Pain Location  Back    Pain Descriptors / Indicators  Aching    Pain Type  Chronic pain    Pain Onset  More than a month ago    Pain Frequency  Intermittent    Aggravating Factors   malpositioning, leaning forward    Pain Relieving Factors  repositioning                          OT Education - 09/10/17 0824    Education Details  PWR! exercises seated basic 4(pt reports he has handout at home) 10-20 reps each, min v.c for larger amplitude  movements, coordination HEP- see pt instructions , writing continuous "l"   Person(s) Educated  Patient    Methods  Explanation;Demonstration;Verbal cues;Handout    Comprehension  Verbalized understanding;Returned demonstration;Verbal cues required       OT Short Term Goals - 09/10/17 7209      OT SHORT TERM GOAL #1   Title  Pt will be independent with updated PD specific HEP--10/08/17    Time  4    Period  Weeks    Status  New      OT SHORT TERM GOAL #2   Title  Pt will improve L hand coordination for ADLs as shown by improving time on 9-hole peg test by at least 4sec with LUE.    Baseline  L 48.25    Time  4    Period  Weeks    Status  New      OT SHORT TERM GOAL #3   Title  Pt will verbalize understanding of memory compensations and keeping thinking skills sharp    Time  4    Period  Weeks    Status  New      OT SHORT TERM GOAL #4   Title  Pt will be able to fasten/unfasten 3 button in less than 70 sec (for improved ability to dress and improved bilateral hand coordination).    Baseline  78 secs    Time  4    Period  Weeks    Status  New      OT SHORT TERM GOAL #5   Title  Pt will verbalize understanding of updated community resources prn.    Time  4    Period  Weeks    Status  New        OT Long Term Goals - 09/10/17 4782      OT LONG TERM GOAL #1   Title  Pt will verbalize understanding of adaptive strategies to incr ease with ADL's/IADLS's--LTGs due 12/09/60    Time  8    Period  Weeks    Status  New      OT LONG TERM GOAL #2   Title  Pt will demonstrate improved standing functional reach for ADLS by improving standing functional reach to 11 inches or greater with RUE    Baseline  RUE 9.5, LUE 11.5    Time  8    Period  Weeks    Status  New      OT LONG TERM GOAL #3   Title  Pt will verbalize understanding of community exercise recommendations and ways to prevent future PD related complications.    Time  8    Period  Weeks    Status  New      OT LONG  TERM GOAL #4   Title  Pt will demonstrate ability to retrieve a lightweight object at 135 shoulder flexion with -20 elbow ext. with RUE.    Time  8    Period  Weeks    Status  New              Patient will benefit from skilled therapeutic intervention in order to improve the following deficits and impairments:     Visit Diagnosis: Abnormal posture  Other symptoms and signs involving the nervous system  Unsteadiness on feet  Other lack of coordination  Other symptoms and signs involving the musculoskeletal system  Attention and concentration deficit    Problem List Patient Active Problem List   Diagnosis Date Noted  . Parkinson disease ([place])     RINE,KATHRYN 09/10/2017, 8:37 AM  [facility] [address] [place], [name], [address] Phone: [phone]   Fax:  [phone]  Name: Ronald Noble MRN: [id number] Date of Birth: 08/20/1945

## 2017-09-10 NOTE — Therapy (Signed)
[facility] [address] [place], [name], [address] Phone: [phone]   Fax:  [phone]  Speech Language Pathology Evaluation  Patient Details  Name: Ronald Noble MRN: [id number] Date of Birth: [birth year] Referring Provider: Star Age, MD   Encounter Date: 09/10/2017  End of Session - 09/10/17 1656    Visit Number  1    Number of Visits  17 (possibly may need only ~12)    Date for SLP Re-Evaluation  11/26/17    SLP Start Time  0847    SLP Stop Time   0930    SLP Time Calculation (min)  43 min    Activity Tolerance  Patient tolerated treatment well       Past Medical History:  Diagnosis Date  . BPH (benign prostatic hyperplasia)   . Parkinson disease ([place])   . Skin cancer of face    arms and back  . Sleep apnea    mild  . Spider bite    brown recluse/ left ankle/08/13    Past Surgical History:  Procedure Laterality Date  . BURR HOLE W/ STEREOTACTIC INSERTION OF DBS LEADS / INTRAOP MICROELECTRODE RECORDING  04/2014  . COLONOSCOPY    . KNEE SURGERY     exploratory/left  . SKIN CANCER EXCISION  01/2014   face  . TRANSURETHRAL RESECTION OF PROSTATE     2008    There were no vitals filed for this visit.  Subjective Assessment - 09/10/17 0905    Subjective  "I haven't been religious about it (loud /a/)."    Currently in Pain?  Yes    Pain Score  2     Pain Location  Back    Pain Orientation  Right;Left    Pain Descriptors / Indicators  Aching    Pain Type  Chronic pain    Pain Onset  More than a month ago    Pain Frequency  Intermittent    Aggravating Factors   mlapositioning    Pain Relieving Factors  repositioning    Multiple Pain Sites  No         SLP Evaluation [facility] - 09/10/17 7408      SLP Visit Information   SLP Received On  09/10/17    Referring Provider  Star Age, MD    Medical Diagnosis  Parkinson's disease      General Information   HPI  Pt is familiar to  SLP. PT's main complaint at this time is rushes of speech; he was speaking with speech in mid-60s dB during his screening.       Prior Functional Status   Cognitive/Linguistic Baseline  Baseline deficits    Baseline deficit details  attention, awareness      Cognition   Overall Cognitive Status  History of cognitive impairments - at baseline    Attention  Divided    Awareness  Impaired    Awareness Impairment  Emergent impairment    Behaviors  Impulsive      Auditory Comprehension   Overall Auditory Comprehension  Appears within functional limits for tasks assessed      Verbal Expression   Overall Verbal Expression  Appears within functional limits for tasks assessed      Oral Motor/Sensory Function   Overall Oral Motor/Sensory Function  Impaired    Labial ROM  Within Functional Limits    Labial Symmetry  Within Functional Limits    Labial Strength  Reduced Right    Labial Coordination  Reduced    Lingual ROM  Within Functional Limits    Lingual Symmetry  Within Functional Limits    Lingual Strength  Reduced Right    Lingual Coordination  Reduced    Velum  Within Functional Limits      Motor Speech   Overall Motor Speech  Impaired    Articulation  Impaired    Level of Impairment  Sentence    Intelligibility  Intelligible largely- however short rushes of speech decr clarity                      SLP Education - 09/10/17 1655    Education Details  loud /a/ reminder for BID each day, think he is dictating "dragon software" when talking to reduce speech rate    Person(s) Educated  Patient    Methods  Explanation;Demonstration;Verbal cues    Comprehension  Verbal cues required;Returned demonstration;Verbalized understanding;Need further instruction       SLP Short Term Goals - 09/10/17 1710      SLP SHORT TERM GOAL #1   Title  Pt will average low-mid 90sdB with loud /a/ for 3 sessions with rare min A    Time  4    Period  Weeks    Status  New      SLP  SHORT TERM GOAL #2   Title  Pt will average low-mid 70sdB on structured speech tasks with rare min A for three sessions    Time  4    Period  Weeks    Status  New      SLP SHORT TERM GOAL #3   Title  Pt will maintain average low 70s dB over 8 minute simple conversation with rare min A, for 2 sessions    Time  4    Period  Weeks    Status  New      SLP SHORT TERM GOAL #4   Title  pt will maintain speech intelligibility at 95%+ using intelligibility compensations for 8 minutes simple conversation in three sesssions    Time  4    Period  Weeks    Status  New      SLP SHORT TERM GOAL #5   Title  pt will demo knowledge of 4 different memory compenstions    Time  4    Period  Weeks    Status  New       SLP Long Term Goals - 09/10/17 1712      SLP LONG TERM GOAL #1   Title  Pt will maintain low 70sdB over 10 minute mod complex conversation with rare min A in three therapy sessions    Time  8    Period  Weeks or 17 sessions, for all LTGs    Status  New      SLP LONG TERM GOAL #2   Title  Pt will maintain 95%+ speech intelligibility in noisy environment outside of tx room over 12 minutes with rare min A, in three sessions    Time  8    Period  Weeks    Status  New      SLP LONG TERM GOAL #3   Title  pt will demo one memory compensation in 2 sessions     Time  8    Period  Weeks    Status  New      SLP LONG TERM GOAL #4   Title  pt will demo anticipatory awareness to independently  double check his work in mod complex cognitive linguistic tasks in 4 sessions    Time  [place] - 09/10/17 1656    Clinical Impression Statement  Pt presents today as well-known to this SLP. He is s/p DBS placement and has most difficulty today with short rushes of speech, which largely do NOT hinder intelligibility. However during his screening in April pt averaged mid 60s dB and intelligibility was affected negatively as a 10-minute conversation progressed.  Pt reports his wife asks him to slow his speech rate regularly and he endorses faster rate as well as softer speech later in the afternoons/evenings, or when he begins an "off" time. Today he took meds at 8 am and at time of eval, [phone], meds were working at optimum levels. SLP suspects pt's speech to be less intelligible during closer to times of remedication. Pt would benefit from skilled ST addressing short rushes of speech as well as loudness maintenance throughout the day.     Speech Therapy Frequency  2x / week    Duration  -- 8 weeks, or 17 sessions total    Treatment/Interventions  SLP instruction and feedback;Oral motor exercises;Cueing hierarchy;Compensatory techniques;Cognitive reorganization;Functional tasks;Patient/family education;Multimodal communcation approach;Internal/external aids       Patient will benefit from skilled therapeutic intervention in order to improve the following deficits and impairments:   Dysarthria and anarthria  Cognitive communication deficit    Problem List Patient Active Problem List   Diagnosis Date Noted  . Parkinson disease ([place])     [place] ,[place], [place]  09/10/2017, 5:15 PM  [facility] [address] [place], [name], [address] Phone: [phone]   Fax:  [phone]  Name: Cutter Passey MRN: [id number] Date of Birth: May 31, 1945

## 2017-09-13 ENCOUNTER — Ambulatory Visit: Payer: Medicare Other | Admitting: Physical Therapy

## 2017-09-13 ENCOUNTER — Encounter: Payer: Self-pay | Admitting: Physical Therapy

## 2017-09-13 DIAGNOSIS — R29818 Other symptoms and signs involving the nervous system: Secondary | ICD-10-CM | POA: Diagnosis not present

## 2017-09-13 DIAGNOSIS — R2681 Unsteadiness on feet: Secondary | ICD-10-CM

## 2017-09-13 DIAGNOSIS — R471 Dysarthria and anarthria: Secondary | ICD-10-CM | POA: Diagnosis not present

## 2017-09-13 DIAGNOSIS — R29898 Other symptoms and signs involving the musculoskeletal system: Secondary | ICD-10-CM | POA: Diagnosis not present

## 2017-09-13 DIAGNOSIS — R293 Abnormal posture: Secondary | ICD-10-CM | POA: Diagnosis not present

## 2017-09-13 DIAGNOSIS — R41840 Attention and concentration deficit: Secondary | ICD-10-CM | POA: Diagnosis not present

## 2017-09-13 DIAGNOSIS — R278 Other lack of coordination: Secondary | ICD-10-CM | POA: Diagnosis not present

## 2017-09-14 NOTE — Therapy (Signed)
[facility] [address] [place], [name], [address] Phone: [phone]   Fax:  [phone]  Physical Therapy Treatment  Patient Details  Name: Ronald Noble MRN: [id number] Date of Birth: 1945/06/10 Referring Provider: Rexene Alberts   Encounter Date: 09/13/2017  PT End of Session - 09/14/17 1210    Visit Number  3    Number of Visits  9    Date for PT Re-Evaluation  11/08/17    Authorization Type  Medicare/BCBS    PT Start Time  1232    PT Stop Time  1314    PT Time Calculation (min)  42 min    Activity Tolerance  Patient tolerated treatment well    Behavior During Therapy  [facility] for tasks assessed/performed       Past Medical History:  Diagnosis Date  . BPH (benign prostatic hyperplasia)   . Parkinson disease ([place])   . Skin cancer of face    arms and back  . Sleep apnea    mild  . Spider bite    brown recluse/ left ankle/08/13    Past Surgical History:  Procedure Laterality Date  . BURR HOLE W/ STEREOTACTIC INSERTION OF DBS LEADS / INTRAOP MICROELECTRODE RECORDING  04/2014  . COLONOSCOPY    . KNEE SURGERY     exploratory/left  . SKIN CANCER EXCISION  01/2014   face  . TRANSURETHRAL RESECTION OF PROSTATE     2008    There were no vitals filed for this visit.  Subjective Assessment - 09/13/17 1234    Subjective  Been trying to do the exercises a little over the weekend.  Last dose of PD meds was 11 AM and next dose is at 2 pm.    Patient Stated Goals  Pt's goals for therapy to improve balance and try to get rid of the forward lean, improve the postural lean to the right.    Currently in Pain?  No/denies                       [facility] Adult PT Treatment/Exercise - 09/14/17 0001      Ambulation/Gait   Ambulation/Gait  Yes    Ambulation/Gait Assistance  7: Independent    Ambulation Distance (Feet)  200 Feet then 230 ft x 4    Assistive device  None    Gait Pattern  Step-through  pattern;Trunk flexed;Poor foot clearance - left;Poor foot clearance - right    Ambulation Surface  Level;Indoor    Gait Comments  Gait with carrying items (blue peanut ball, stool, ball) simulating household and yard items he may carry, 115-230 ft each, with cues for upright posture, cues for increased step length, and cues to stop and reset if he experiences forward flexed posture and hastening (only noted once during PT session).  Had discussion regarding safety with gait and dual tasking given pt's motor fluctuations with on/off times of medications, with safety concerns specifically over carrying weedeater, chainsaw, and climbing ladders to clean gutters.  PT recommends those activities be delegated to someone else due to safety concerns with functional moiblity fluctuations; however, pt states he knows when he is moving well or moving poorly, and he knows not to do those activities when he is not moving well.  He states he will consider delegating those activities to someone else.      Posture/Postural Control   Posture/Postural Control  Postural limitations    Posture Comments  Postural exercises:  reviewed supine chin tuck, with pt performing 3 reps x 30 seconds; performed chin tuck in sitting in front of wall 10 x 3 sec hold, then standing at wall, 3 x  second hold (cues given in standing, as pt tends to hike shoulders and use more neck extension).       With chin tuck in varied positions, cues given to relax through shoulders and neck and only perform neck retraction.   [facility]) - 09/13/17 1241    PWR! exercises  Moves in supine;Moves in sitting;Moves in standing    PWR! Up  x 10 reps through shoulders    PWR! Rock  x 10 reps each side to work on trunk stretch, especially on R    Comments  PWR! Moves in supine for posture and for trunk flexibility    PWR! Up  x 10 reps    PWR! Rock  x 10 reps each side (ipsalateral reach, then contralateral reach)    PWR! Twist       PWR Step  x 10 reps  each side    Comments  PWR! Moves in standing for posture, weightshifting and reach, trunk flexibility, and step strategy.    PWR! Up  x 10 reps     PWR! Rock  x 10 reps R reaching with L lateral lean for R trunk stretch    Comments  PWR! Moves in sitting for posture and trunk stretch/flexibility.          PT Education - 09/14/17 1209    Education Details  HEP-PWR! Moves in standing; safety recommendations for carrying items/yard and housework with gait    Person(s) Educated  Patient    Methods  Explanation;Demonstration;Handout    Comprehension  Verbalized understanding          PT Long Term Goals - 09/09/17 1517      PT LONG TERM GOAL #1   Title  Pt will verbalize understanding of fall prevention in home environment.  TARGET 10/08/17    Time  5    Period  Weeks    Status  New    Target Date  10/08/17      PT LONG TERM GOAL #2   Title  Pt will improve MiniBESTest score to at least 25/28 for decreased fall risk.    Time  4    Period  Weeks    Status  New    Target Date  10/08/17      PT LONG TERM GOAL #3   Title  Pt will verbalize understanding of techniques to reduce festination/freezing of gait.    Time  5    Period  Weeks    Status  New    Target Date  10/08/17      PT LONG TERM GOAL #4   Title  Pt will be independent with progression of HEP to address balance, posture, and gait.    Time  4    Period  Weeks    Status  New    Target Date  10/08/17            Plan - 09/14/17 1210    Clinical Impression Statement  Skilled PT session focused on postural correction with upright posture and with activities to incorporate trunk lean and stretch towards L side-worked on aspects of PWR! Moves in supine, sitting, and in standing to address this.  Pt did not bring in HEP notebook, but did add standing PWR! Moves to HEP.  Pt will  continue to benefit from skilled PT to address balance and gait for improved safety with functional mobility.    Rehab Potential  Good     PT Frequency  2x / week    PT Duration  4 weeks plus eval    PT Treatment/Interventions  ADLs/Self Care Home Management;Balance training;Therapeutic exercise;Therapeutic activities;Functional mobility training;Gait training;Neuromuscular re-education;Patient/family education    PT Next Visit Plan  Review standing PWR! MOves, discuss tips to reduce freezing with gait; gait with carrying tasks (simulating yard equipment); gait/postural activities; ask pt to bring in [place] and Agree with Plan of Care  Patient       Patient will benefit from skilled therapeutic intervention in order to improve the following deficits and impairments:  Abnormal gait, Decreased balance, Decreased mobility, Difficulty walking, Postural dysfunction  Visit Diagnosis: Abnormal posture  Other symptoms and signs involving the nervous system  Unsteadiness on feet     Problem List Patient Active Problem List   Diagnosis Date Noted  . Parkinson disease ([place])     Carnelius Hammitt W. 09/14/2017, 12:14 PM  Frazier Butt., PT   [facility] [address] [place], [name], [address] Phone: [phone]   Fax:  [phone]  Name: Ronald Noble MRN: [id number] Date of Birth: 10/27/1945

## 2017-09-16 ENCOUNTER — Encounter: Payer: Self-pay | Admitting: Physical Therapy

## 2017-09-16 ENCOUNTER — Ambulatory Visit: Payer: Medicare Other | Admitting: Physical Therapy

## 2017-09-16 ENCOUNTER — Ambulatory Visit: Payer: Medicare Other | Admitting: Occupational Therapy

## 2017-09-16 DIAGNOSIS — R41840 Attention and concentration deficit: Secondary | ICD-10-CM | POA: Diagnosis not present

## 2017-09-16 DIAGNOSIS — R293 Abnormal posture: Secondary | ICD-10-CM

## 2017-09-16 DIAGNOSIS — R278 Other lack of coordination: Secondary | ICD-10-CM

## 2017-09-16 DIAGNOSIS — R471 Dysarthria and anarthria: Secondary | ICD-10-CM | POA: Diagnosis not present

## 2017-09-16 DIAGNOSIS — R2681 Unsteadiness on feet: Secondary | ICD-10-CM

## 2017-09-16 DIAGNOSIS — R29898 Other symptoms and signs involving the musculoskeletal system: Secondary | ICD-10-CM

## 2017-09-16 DIAGNOSIS — R29818 Other symptoms and signs involving the nervous system: Secondary | ICD-10-CM | POA: Diagnosis not present

## 2017-09-16 NOTE — Patient Instructions (Signed)
   PWR! Moves Flow:  PWR! Up >PWR! Rock (same side reach)>PWR! Twist (opposite side reach)>PWR! Step  Repeat this sequence 5-10 reps

## 2017-09-16 NOTE — Therapy (Signed)
[facility] [address] [place], [name], [address] Phone: [phone]   Fax:  [phone]  Occupational Therapy Treatment  Patient Details  Name: Ronald Noble MRN: [id number] Date of Birth: [birth year] Referring Provider: Rexene Alberts   Encounter Date: 09/16/2017  OT End of Session - 09/16/17 0952    Visit Number  3    Number of Visits  17    Date for OT Re-Evaluation  11/07/17    Authorization Type  Medicare    OT Start Time  0935    OT Stop Time  1015    OT Time Calculation (min)  40 min    Activity Tolerance  Patient tolerated treatment well    Behavior During Therapy  [facility] for tasks assessed/performed       Past Medical History:  Diagnosis Date  . BPH (benign prostatic hyperplasia)   . Parkinson disease ([place])   . Skin cancer of face    arms and back  . Sleep apnea    mild  . Spider bite    brown recluse/ left ankle/08/13    Past Surgical History:  Procedure Laterality Date  . BURR HOLE W/ STEREOTACTIC INSERTION OF DBS LEADS / INTRAOP MICROELECTRODE RECORDING  04/2014  . COLONOSCOPY    . KNEE SURGERY     exploratory/left  . SKIN CANCER EXCISION  01/2014   face  . TRANSURETHRAL RESECTION OF PROSTATE     2008    There were no vitals filed for this visit.  Subjective Assessment - 09/16/17 0938    Subjective   Pt reports he thinks he forgot meds today    Patient Stated Goals  improve coordination    Currently in Pain?  No/denies             Treatment: Therapist recommended pt considers use of an alarm on cell phone or watch to cue him regarding meds. Dynamic rock and reach, to copy small peg design with bilateral UE's for , min v.c for larger amplitude movements and correct design.              OT Education - 09/16/17 1007    Education Details  PWR! quadraped basic 4, 10-20 reps each exercise, min v.c for larger amplitude movements, PWR! hands    Person(s) Educated  Patient     Methods  Explanation;Demonstration;Verbal cues;Handout    Comprehension  Verbalized understanding;Returned demonstration;Verbal cues required       OT Short Term Goals - 09/10/17 0086      OT SHORT TERM GOAL #1   Title  Pt will be independent with updated PD specific HEP--10/08/17    Time  4    Period  Weeks    Status  New      OT SHORT TERM GOAL #2   Title  Pt will improve L hand coordination for ADLs as shown by improving time on 9-hole peg test by at least 4sec with LUE.    Baseline  L 48.25    Time  4    Period  Weeks    Status  New      OT SHORT TERM GOAL #3   Title  Pt will verbalize understanding of memory compensations and keeping thinking skills sharp    Time  4    Period  Weeks    Status  New      OT SHORT TERM GOAL #4   Title  Pt will be able to fasten/unfasten 3 button in  less than 70 sec (for improved ability to dress and improved bilateral hand coordination).    Baseline  78 secs    Time  4    Period  Weeks    Status  New      OT SHORT TERM GOAL #5   Title  Pt will verbalize understanding of updated community resources prn.    Time  4    Period  Weeks    Status  New        OT Long Term Goals - 09/10/17 7829      OT LONG TERM GOAL #1   Title  Pt will verbalize understanding of adaptive strategies to incr ease with ADL's/IADLS's--LTGs due 08/10/19    Time  8    Period  Weeks    Status  New      OT LONG TERM GOAL #2   Title  Pt will demonstrate improved standing functional reach for ADLS by improving standing functional reach to 11 inches or greater with RUE    Baseline  RUE 9.5, LUE 11.5    Time  8    Period  Weeks    Status  New      OT LONG TERM GOAL #3   Title  Pt will verbalize understanding of community exercise recommendations and ways to prevent future PD related complications.    Time  8    Period  Weeks    Status  New      OT LONG TERM GOAL #4   Title  Pt will demonstrate ability to retrieve a lightweight object at 135 shoulder  flexion with -20 elbow ext. with RUE.    Time  8    Period  Weeks    Status  New            Plan - 09/16/17 1008    Clinical Impression Statement  Pt is progressing towards goals. He responds well to v.c for larger amplitude movement.    Occupational Profile and client history currently impacting functional performance  Pt works full time as an Forensic psychologist, he reports decreased dexterity and difficulty with handwritiing PMH: PD diagnosed in 2008, s/p bilateral DBS, skin cancer, sleep apnea, BPH, TURP, orthostatic hypotension, syncopal episodes; arthritic changes in spine    Rehab Potential  Good    OT Frequency  2x / week    OT Duration  8 weeks    OT Treatment/Interventions  Self-care/ADL training;Therapeutic exercise;Patient/family education;Moist Heat;Energy conservation;Therapist, nutritional;Therapeutic activities;Balance training;Cognitive remediation/compensation;Passive range of motion;Manual Therapy;DME and/or AE instruction;Cryotherapy;Fluidtherapy    Plan  review coordination HEP prn, adapted strategies for ADLS, memory strategies    Consulted and Agree with Plan of Care  Patient       Patient will benefit from skilled therapeutic intervention in order to improve the following deficits and impairments:  Abnormal gait, Decreased cognition, Impaired flexibility, Pain, Decreased mobility, Decreased coordination, Decreased endurance, Decreased range of motion, Decreased strength, Impaired UE functional use, Impaired perceived functional ability, Difficulty walking, Decreased safety awareness, Decreased knowledge of precautions, Decreased balance  Visit Diagnosis: Abnormal posture  Other symptoms and signs involving the nervous system  Other lack of coordination  Other symptoms and signs involving the musculoskeletal system  Attention and concentration deficit    Problem List Patient Active Problem List   Diagnosis Date Noted  . Parkinson disease ([place])      Blossom Crume 09/16/2017, 10:10 AM  [facility] [address] [place], [name], [address]  Phone: [phone]   Fax:  [phone]  Name: Ronald Noble MRN: [id number] Date of Birth: [birth year]

## 2017-09-16 NOTE — Therapy (Signed)
[place] [address] [place], [name], [address] Phone: [phone]   Fax:  [phone]  Physical Therapy Treatment  Patient Details  Name: Ronald Noble MRN: [id number] Date of Birth: 1945-12-08 Referring Provider: Rexene Alberts   Encounter Date: 09/16/2017  PT End of Session - 09/16/17 2018    Visit Number  4    Number of Visits  9    Date for PT Re-Evaluation  11/08/17    Authorization Type  Medicare/BCBS    PT Start Time  0847    PT Stop Time  0929    PT Time Calculation (min)  42 min    Activity Tolerance  Patient tolerated treatment well    Behavior During Therapy  [facility] for tasks assessed/performed       Past Medical History:  Diagnosis Date  . BPH (benign prostatic hyperplasia)   . Parkinson disease ([place])   . Skin cancer of face    arms and back  . Sleep apnea    mild  . Spider bite    brown recluse/ left ankle/08/13    Past Surgical History:  Procedure Laterality Date  . BURR HOLE W/ STEREOTACTIC INSERTION OF DBS LEADS / INTRAOP MICROELECTRODE RECORDING  04/2014  . COLONOSCOPY    . KNEE SURGERY     exploratory/left  . SKIN CANCER EXCISION  01/2014   face  . TRANSURETHRAL RESECTION OF PROSTATE     2008    There were no vitals filed for this visit.  Subjective Assessment - 09/16/17 0851    Subjective  Not sure if I took my PD medications this morning, so it might be an off morning.    Patient Stated Goals  Pt's goals for therapy to improve balance and try to get rid of the forward lean, improve the postural lean to the right.    Currently in Pain?  No/denies                       [facility] Adult PT Treatment/Exercise - 09/16/17 0001      Ambulation/Gait   Gait Comments  GAit with quick start/stops and quick turns, around gym x 400 ft      Posture/Postural Control   Posture/Postural Control  Postural limitations    Posture Comments  Standing reaching posture stretch  overhead at doorframe, 3 reps x 10 seconds        PWR [facility]) - 09/16/17 3810    PWR! exercises  Moves in supine;Moves in sitting;Moves in standing    PWR! Up  x 10 reps through shoulders (with tactile cues) then x 10 reps through hips    PWR! Rock  x 10 reps each side, stretch for trunk flexibility    Comments  PWR! Moves in supine for posture/trunk flexibility    PWR! Up  x 10 reps, then x 10 reps with visual cues of mirror for posture    PWR! Rock  x 10 reps each side    PWR! Twist  x 10 reps with cross reach, activation of both arms    PWR Step  x 10 reps each side    Basic 4 Flow  PWR! Flow in standing x 5 reps    Comments  PWR! MOves in standing-use of visual cues of mirror to relax hiking of shoulders and to decrease R lateral lean.  PT provides occasional tactile cues at R trunk.    PWR! Up  x 10 reps for  upright posture    PWR! Rock  x 10 reps each side, for improved trunk stretch and flexibility    Comments  PWR! Moves in sitting to address posture, trunk flexibility      Standing in front of mirror, widened BOS, with visual cues of mirror and tactile cues of therapist to elongate R trunk and avoid shoulder hike-then holding position 10 seconds, 5 reps.    PT Education - 09/16/17 2017    Education Details  PWR! Moves FLOW in standing for postural stretch, flexibility, balance added to HEP; bring in HEP folder next visit    Person(s) Educated  Patient    Methods  Explanation;Demonstration;Handout    Comprehension  Verbalized understanding;Returned demonstration          PT Long Term Goals - 09/09/17 1517      PT LONG TERM GOAL #1   Title  Pt will verbalize understanding of fall prevention in home environment.  TARGET 10/08/17    Time  5    Period  Weeks    Status  New    Target Date  10/08/17      PT LONG TERM GOAL #2   Title  Pt will improve MiniBESTest score to at least 25/28 for decreased fall risk.    Time  4    Period  Weeks    Status  New    Target Date   10/08/17      PT LONG TERM GOAL #3   Title  Pt will verbalize understanding of techniques to reduce festination/freezing of gait.    Time  5    Period  Weeks    Status  New    Target Date  10/08/17      PT LONG TERM GOAL #4   Title  Pt will be independent with progression of HEP to address balance, posture, and gait.    Time  4    Period  Weeks    Status  New    Target Date  10/08/17            Plan - 09/16/17 2018    Clinical Impression Statement  Continued to work on dynamic postural stretches and strengthening utlizing PWR! MOves in varied positions, with pt requiring cues at times for best posture.  He responds well to visual cues of mirror to assist with decreasing R lateral trunk lean.    Rehab Potential  Good    PT Frequency  2x / week    PT Duration  4 weeks plus eval    PT Treatment/Interventions  ADLs/Self Care Home Management;Balance training;Therapeutic exercise;Therapeutic activities;Functional mobility training;Gait training;Neuromuscular re-education;Patient/family education    PT Next Visit Plan  Review standing PWR! MOves FLOW and try on compliant surface; discuss tips to reduce freezing with gait; gait with carrying tasks (simulating yard equipment); gait/postural activities; ask pt to bring in [place] and Agree with Plan of Care  Patient       Patient will benefit from skilled therapeutic intervention in order to improve the following deficits and impairments:  Abnormal gait, Decreased balance, Decreased mobility, Difficulty walking, Postural dysfunction  Visit Diagnosis: Abnormal posture  Other symptoms and signs involving the nervous system  Unsteadiness on feet     Problem List Patient Active Problem List   Diagnosis Date Noted  . Parkinson disease ([place])     Kelsen Celona W. 09/16/2017, 8:20 PM  Frazier Butt., PT   [place] [phone]  [place],  [name], [address] Phone: [phone]   Fax:  [phone]  Name: Ronald Noble MRN: [id number] Date of Birth: [birth year]

## 2017-09-20 ENCOUNTER — Encounter: Payer: Self-pay | Admitting: Physical Therapy

## 2017-09-20 ENCOUNTER — Ambulatory Visit: Payer: Medicare Other | Admitting: Physical Therapy

## 2017-09-20 ENCOUNTER — Ambulatory Visit: Payer: Medicare Other | Admitting: Occupational Therapy

## 2017-09-20 DIAGNOSIS — R29818 Other symptoms and signs involving the nervous system: Secondary | ICD-10-CM

## 2017-09-20 DIAGNOSIS — R29898 Other symptoms and signs involving the musculoskeletal system: Secondary | ICD-10-CM

## 2017-09-20 DIAGNOSIS — R278 Other lack of coordination: Secondary | ICD-10-CM

## 2017-09-20 DIAGNOSIS — R41840 Attention and concentration deficit: Secondary | ICD-10-CM

## 2017-09-20 DIAGNOSIS — R2681 Unsteadiness on feet: Secondary | ICD-10-CM

## 2017-09-20 DIAGNOSIS — R471 Dysarthria and anarthria: Secondary | ICD-10-CM | POA: Diagnosis not present

## 2017-09-20 DIAGNOSIS — R293 Abnormal posture: Secondary | ICD-10-CM

## 2017-09-20 NOTE — Therapy (Signed)
[place] [address] [place], [name], [address] Phone: [phone]   Fax:  [phone]  Occupational Therapy Treatment  Patient Details  Name: Ronald Noble MRN: [id number] Date of Birth: 03-28-1946 Referring Provider: Rexene Alberts   Encounter Date: 09/20/2017  OT End of Session - 09/20/17 1219    Visit Number  4    Number of Visits  17    Date for OT Re-Evaluation  11/07/17    Authorization Type  Medicare    OT Start Time  0935    OT Stop Time  1015    OT Time Calculation (min)  40 min       Past Medical History:  Diagnosis Date  . BPH (benign prostatic hyperplasia)   . Parkinson disease ([place])   . Skin cancer of face    arms and back  . Sleep apnea    mild  . Spider bite    brown recluse/ left ankle/08/13    Past Surgical History:  Procedure Laterality Date  . BURR HOLE W/ STEREOTACTIC INSERTION OF DBS LEADS / INTRAOP MICROELECTRODE RECORDING  04/2014  . COLONOSCOPY    . KNEE SURGERY     exploratory/left  . SKIN CANCER EXCISION  01/2014   face  . TRANSURETHRAL RESECTION OF PROSTATE     2008    There were no vitals filed for this visit.  Subjective Assessment - 09/20/17 0945    Subjective   Pt reports doing a lot of yardwork this weekend    Pertinent History  PD diagnosed in 2008, DBS,     Patient Stated Goals  improve coordination    Currently in Pain?  No/denies              Treatment: Handwriting strategies and use of flicks, min v.c with improved letter size and legibility.             OT Education - 09/20/17 0954    Education Details  PWR! hands basic 4, 10 reps each, mod v.c for techniques, Reveiwed activities from coordination HEP(rotate ball, stack coins/ and manipulate, flipping and dealing cards), recommendation that pt does not climb ladders due to fall risk rlated to balance issues    Person(s) Educated  Patient    Methods  Explanation;Demonstration;Verbal  cues;Handout    Comprehension  Verbalized understanding;Returned demonstration;Verbal cues required       OT Short Term Goals - 09/10/17 8338      OT SHORT TERM GOAL #1   Title  Pt will be independent with updated PD specific HEP--10/08/17    Time  4    Period  Weeks    Status  New      OT SHORT TERM GOAL #2   Title  Pt will improve L hand coordination for ADLs as shown by improving time on 9-hole peg test by at least 4sec with LUE.    Baseline  L 48.25    Time  4    Period  Weeks    Status  New      OT SHORT TERM GOAL #3   Title  Pt will verbalize understanding of memory compensations and keeping thinking skills sharp    Time  4    Period  Weeks    Status  New      OT SHORT TERM GOAL #4   Title  Pt will be able to fasten/unfasten 3 button in less than 70 sec (for improved ability to dress and improved  bilateral hand coordination).    Baseline  78 secs    Time  4    Period  Weeks    Status  New      OT SHORT TERM GOAL #5   Title  Pt will verbalize understanding of updated community resources prn.    Time  4    Period  Weeks    Status  New        OT Long Term Goals - 09/10/17 5621      OT LONG TERM GOAL #1   Title  Pt will verbalize understanding of adaptive strategies to incr ease with ADL's/IADLS's--LTGs due 3/0/86    Time  8    Period  Weeks    Status  New      OT LONG TERM GOAL #2   Title  Pt will demonstrate improved standing functional reach for ADLS by improving standing functional reach to 11 inches or greater with RUE    Baseline  RUE 9.5, LUE 11.5    Time  8    Period  Weeks    Status  New      OT LONG TERM GOAL #3   Title  Pt will verbalize understanding of community exercise recommendations and ways to prevent future PD related complications.    Time  8    Period  Weeks    Status  New      OT LONG TERM GOAL #4   Title  Pt will demonstrate ability to retrieve a lightweight object at 135 shoulder flexion with -20 elbow ext. with RUE.    Time  8     Period  Weeks    Status  New            Plan - 09/20/17 1222    Clinical Impression Statement  Pt is progressing towards goals, he benefits from review of big movement strategies with functional acitivity and requires v.c to slow down.    Rehab Potential  Good    Current Impairments/barriers affecting progress:  length of time since onset, cognitive deficits    OT Frequency  2x / week    OT Duration  8 weeks    OT Treatment/Interventions  Self-care/ADL training;Therapeutic exercise;Patient/family education;Moist Heat;Energy conservation;Therapist, nutritional;Therapeutic activities;Balance training;Cognitive remediation/compensation;Passive range of motion;Manual Therapy;DME and/or AE instruction;Cryotherapy;Fluidtherapy    Plan  adapted strategies for ADLs, dynamic step and reach , memory strategies    Consulted and Agree with Plan of Care  Patient       Patient will benefit from skilled therapeutic intervention in order to improve the following deficits and impairments:  Abnormal gait, Decreased cognition, Impaired flexibility, Pain, Decreased mobility, Decreased coordination, Decreased endurance, Decreased range of motion, Decreased strength, Impaired UE functional use, Impaired perceived functional ability, Difficulty walking, Decreased safety awareness, Decreased knowledge of precautions, Decreased balance  Visit Diagnosis: Other symptoms and signs involving the nervous system  Other lack of coordination  Other symptoms and signs involving the musculoskeletal system  Attention and concentration deficit    Problem List Patient Active Problem List   Diagnosis Date Noted  . Parkinson disease ([place])     Catalena Stanhope 09/20/2017, 12:24 PM  [place] [address] [place], [name], [address] Phone: [phone]   Fax:  [phone]  Name: Skippy Marhefka MRN: [id number] Date of Birth: 1945/06/22

## 2017-09-20 NOTE — Therapy (Signed)
[facility] [address] [place], [name], [address] Phone: [phone]   Fax:  [phone]  Physical Therapy Treatment  Patient Details  Name: Ronald Noble MRN: [id number] Date of Birth: 11-26-1945 Referring Provider: Rexene Alberts   Encounter Date: 09/20/2017  PT End of Session - 09/20/17 0930    Visit Number  5    Number of Visits  9    Date for PT Re-Evaluation  11/08/17    Authorization Type  Medicare/BCBS    PT Start Time  0845    PT Stop Time  0930    PT Time Calculation (min)  45 min    Activity Tolerance  Patient tolerated treatment well    Behavior During Therapy  [facility] for tasks assessed/performed       Past Medical History:  Diagnosis Date  . BPH (benign prostatic hyperplasia)   . Parkinson disease ([place])   . Skin cancer of face    arms and back  . Sleep apnea    mild  . Spider bite    brown recluse/ left ankle/08/13    Past Surgical History:  Procedure Laterality Date  . BURR HOLE W/ STEREOTACTIC INSERTION OF DBS LEADS / INTRAOP MICROELECTRODE RECORDING  04/2014  . COLONOSCOPY    . KNEE SURGERY     exploratory/left  . SKIN CANCER EXCISION  01/2014   face  . TRANSURETHRAL RESECTION OF PROSTATE     2008    There were no vitals filed for this visit.  Subjective Assessment - 09/20/17 0847    Subjective  Pt's PD meds are being adjusted.  Pt has  back pain from yard work over the weekend. Pt works out 1.5 hr daily with program including cardio, strength, stretching, balance exercise components.    Currently in Pain?  Yes    Pain Location  Back    Pain Descriptors / Indicators  Stabbing    Pain Type  Chronic pain    Pain Onset  More than a month ago    Pain Frequency  Intermittent                       [facility] Adult PT Treatment/Exercise - 09/20/17 0001      Exercises   Exercises  Lumbar      Lumbar Exercises: Stretches   Other Lumbar Stretch Exercise  Tall kneel and  progressed to 1/2 kneel working on posutural alignment, strengthening and balance, using a mirror for feed back and weighted ball (bilateral UE extensions/ raises with trunk extensions and rotations)                          [facility]) - 09/20/17 9518    PWR! Up  x15 with mirror for postural feedback to correct right trunk lean    PWR! Step  x20 with mirror for postural feedback to correct right trunk lean          PT Education - 09/20/17 1232    Education Details  Reviewed current HEP which has cardio, strength, stretching and balance components.    Person(s) Educated  Patient    Methods  Explanation    Comprehension  Verbalized understanding          PT Long Term Goals - 09/09/17 1517      PT LONG TERM GOAL #1   Title  Pt will verbalize understanding of fall prevention in home environment.  TARGET  10/08/17    Time  5    Period  Weeks    Status  New    Target Date  10/08/17      PT LONG TERM GOAL #2   Title  Pt will improve MiniBESTest score to at least 25/28 for decreased fall risk.    Time  4    Period  Weeks    Status  New    Target Date  10/08/17      PT LONG TERM GOAL #3   Title  Pt will verbalize understanding of techniques to reduce festination/freezing of gait.    Time  5    Period  Weeks    Status  New    Target Date  10/08/17      PT LONG TERM GOAL #4   Title  Pt will be independent with progression of HEP to address balance, posture, and gait.    Time  4    Period  Weeks    Status  New    Target Date  10/08/17            Plan - 09/20/17 1224    Clinical Impression Statement  Verbally reviewed pt's current HEP which seems to be thorough and performed consistently throughout the week.  Reviewed and performed tall kneel and 1/2 kneel components of PWR! Moves educating pt on how he can use these moves with mirror at home to work on postural alignment and strength;  pt performed transitions in and out of quadruped, tall and 1/2 kneel with  intermittent UE support for balance and min verbal and manual cues for  posture and weight shifting to address right trunk lean.                                       Rehab Potential  Good    PT Frequency  2x / week    PT Duration  4 weeks plus eval    PT Treatment/Interventions  ADLs/Self Care Home Management;Balance training;Therapeutic exercise;Therapeutic activities;Functional mobility training;Gait training;Neuromuscular re-education;Patient/family education    PT Next Visit Plan  Review standing PWR! MOves FLOW and try on compliant surface; discuss tips to reduce freezing with gait; gait with carrying tasks (simulating yard equipment); gait/postural activities; ask pt to bring in [place] and Agree with Plan of Care  Patient       Patient will benefit from skilled therapeutic intervention in order to improve the following deficits and impairments:  Abnormal gait, Decreased balance, Decreased mobility, Difficulty walking, Postural dysfunction  Visit Diagnosis: Abnormal posture  Other symptoms and signs involving the nervous system  Unsteadiness on feet     Problem List Patient Active Problem List   Diagnosis Date Noted  . Parkinson disease [facility])     Bjorn Loser, PTA  09/20/17, 12:37 PM [facility] [address] [place], [name], [address] Phone: [phone]   Fax:  [phone]  Name: Maxton Noreen MRN: [id number] Date of Birth: 1945/11/08

## 2017-09-20 NOTE — Patient Instructions (Signed)

## 2017-09-21 ENCOUNTER — Ambulatory Visit: Payer: Medicare Other | Admitting: Occupational Therapy

## 2017-09-21 ENCOUNTER — Ambulatory Visit: Payer: Medicare Other | Admitting: Physical Therapy

## 2017-09-21 ENCOUNTER — Encounter: Payer: Self-pay | Admitting: Physical Therapy

## 2017-09-21 DIAGNOSIS — R41840 Attention and concentration deficit: Secondary | ICD-10-CM | POA: Diagnosis not present

## 2017-09-21 DIAGNOSIS — R293 Abnormal posture: Secondary | ICD-10-CM | POA: Diagnosis not present

## 2017-09-21 DIAGNOSIS — R29818 Other symptoms and signs involving the nervous system: Secondary | ICD-10-CM

## 2017-09-21 DIAGNOSIS — R2681 Unsteadiness on feet: Secondary | ICD-10-CM

## 2017-09-21 DIAGNOSIS — R471 Dysarthria and anarthria: Secondary | ICD-10-CM | POA: Diagnosis not present

## 2017-09-21 DIAGNOSIS — R278 Other lack of coordination: Secondary | ICD-10-CM

## 2017-09-21 DIAGNOSIS — R29898 Other symptoms and signs involving the musculoskeletal system: Secondary | ICD-10-CM

## 2017-09-21 NOTE — Therapy (Signed)
[facility] [address] [place], [name], [address] Phone: [phone]   Fax:  [phone]  Occupational Therapy Treatment  Patient Details  Name: Ronald Noble MRN: [id number] Date of Birth: 1945-09-21 Referring Provider: Rexene Alberts   Encounter Date: 09/21/2017  OT End of Session - 09/21/17 1208    Visit Number  5    Number of Visits  17    Date for OT Re-Evaluation  11/07/17    Authorization Type  Medicare    OT Start Time  1107    OT Stop Time  1145    OT Time Calculation (min)  38 min    Activity Tolerance  Patient tolerated treatment well    Behavior During Therapy  [facility] for tasks assessed/performed       Past Medical History:  Diagnosis Date  . BPH (benign prostatic hyperplasia)   . Parkinson disease ([place])   . Skin cancer of face    arms and back  . Sleep apnea    mild  . Spider bite    brown recluse/ left ankle/08/13    Past Surgical History:  Procedure Laterality Date  . BURR HOLE W/ STEREOTACTIC INSERTION OF DBS LEADS / INTRAOP MICROELECTRODE RECORDING  04/2014  . COLONOSCOPY    . KNEE SURGERY     exploratory/left  . SKIN CANCER EXCISION  01/2014   face  . TRANSURETHRAL RESECTION OF PROSTATE     2008    There were no vitals filed for this visit.  Subjective Assessment - 09/21/17 1207    Patient Stated Goals  improve coordination    Currently in Pain?  No/denies        Treatment: dynamic step and reach to copy small peg design, pt demonstrates good accuracy with peg design, however he demonstrates difficulty with stepping with multi- tasking requiring v.c for big movements, foot placement. Removing pegs with in hand manipulation, min v.c                   OT Education - 09/21/17 1207    Education Details  PWR! basic 4 in prone 10-20 reps each min-mod v.c required   Person(s) Educated  Patient    Methods  Explanation;Demonstration;Verbal cues;Handout    Comprehension  Verbalized understanding;Returned demonstration;Verbal cues required       OT Short Term Goals - 09/10/17 4034      OT SHORT TERM GOAL #1   Title  Pt will be independent with updated PD specific HEP--10/08/17    Time  4    Period  Weeks    Status  New      OT SHORT TERM GOAL #2   Title  Pt will improve L hand coordination for ADLs as shown by improving time on 9-hole peg test by at least 4sec with LUE.    Baseline  L 48.25    Time  4    Period  Weeks    Status  New      OT SHORT TERM GOAL #3   Title  Pt will verbalize understanding of memory compensations and keeping thinking skills sharp    Time  4    Period  Weeks    Status  New      OT SHORT TERM GOAL #4   Title  Pt will be able to fasten/unfasten 3 button in less than 70 sec (for improved ability to dress and improved bilateral hand coordination).    Baseline  78 secs  Time  4    Period  Weeks    Status  New      OT SHORT TERM GOAL #5   Title  Pt will verbalize understanding of updated community resources prn.    Time  4    Period  Weeks    Status  New        OT Long Term Goals - 09/10/17 1740      OT LONG TERM GOAL #1   Title  Pt will verbalize understanding of adaptive strategies to incr ease with ADL's/IADLS's--LTGs due 11/05/42    Time  8    Period  Weeks    Status  New      OT LONG TERM GOAL #2   Title  Pt will demonstrate improved standing functional reach for ADLS by improving standing functional reach to 11 inches or greater with RUE    Baseline  RUE 9.5, LUE 11.5    Time  8    Period  Weeks    Status  New      OT LONG TERM GOAL #3   Title  Pt will verbalize understanding of community exercise recommendations and ways to prevent future PD related complications.    Time  8    Period  Weeks    Status  New      OT LONG TERM GOAL #4   Title  Pt will demonstrate ability to retrieve a lightweight object at 135 shoulder flexion with -20 elbow ext. with RUE.    Time  8    Period  Weeks     Status  New            Plan - 09/21/17 1209    Clinical Impression Statement  Pt is progressing towards goals. He demonstrates difficulties with multi-tasking with functional activity.    Occupational performance deficits (Please refer to evaluation for details):  ADL's;IADL's;Work;Leisure;Social Participation    Rehab Potential  Good    Current Impairments/barriers affecting progress:  length of time since onset, cognitive deficits    OT Frequency  2x / week    OT Duration  8 weeks    OT Treatment/Interventions  Self-care/ADL training;Therapeutic exercise;Patient/family education;Moist Heat;Energy conservation;Therapist, nutritional;Therapeutic activities;Balance training;Cognitive remediation/compensation;Passive range of motion;Manual Therapy;DME and/or AE instruction;Cryotherapy;Fluidtherapy    Plan  adapted strategies for ADLs,, memory strategies       Patient will benefit from skilled therapeutic intervention in order to improve the following deficits and impairments:  Abnormal gait, Decreased cognition, Impaired flexibility, Pain, Decreased mobility, Decreased coordination, Decreased endurance, Decreased range of motion, Decreased strength, Impaired UE functional use, Impaired perceived functional ability, Difficulty walking, Decreased safety awareness, Decreased knowledge of precautions, Decreased balance  Visit Diagnosis: Abnormal posture  Other symptoms and signs involving the nervous system  Other lack of coordination  Other symptoms and signs involving the musculoskeletal system  Attention and concentration deficit    Problem List Patient Active Problem List   Diagnosis Date Noted  . Parkinson disease ([name])     Ronald Noble 09/21/2017, 12:11 PM  [facility] [address] [place], [name], [address] Phone: [phone]   Fax:  [phone]  Name: Ronald Noble MRN: [id number] Date  of Birth: May 06, 1945

## 2017-09-21 NOTE — Therapy (Signed)
[facility] [address] [place], [name], [address] Phone: [phone]   Fax:  [phone]  Physical Therapy Treatment  Patient Details  Name: Ronald Noble MRN: [id number] Date of Birth: [birth year] Referring Provider: Rexene Alberts   Encounter Date: 09/21/2017  PT End of Session - 09/21/17 1238    Visit Number  6    Number of Visits  9    Date for PT Re-Evaluation  11/08/17    Authorization Type  Medicare/BCBS    PT Start Time  1150    PT Stop Time  1229    PT Time Calculation (min)  39 min    Activity Tolerance  Patient tolerated treatment well    Behavior During Therapy  [facility] for tasks assessed/performed       Past Medical History:  Diagnosis Date  . BPH (benign prostatic hyperplasia)   . Parkinson disease ([place])   . Skin cancer of face    arms and back  . Sleep apnea    mild  . Spider bite    brown recluse/ left ankle/08/13    Past Surgical History:  Procedure Laterality Date  . BURR HOLE W/ STEREOTACTIC INSERTION OF DBS LEADS / INTRAOP MICROELECTRODE RECORDING  04/2014  . COLONOSCOPY    . KNEE SURGERY     exploratory/left  . SKIN CANCER EXCISION  01/2014   face  . TRANSURETHRAL RESECTION OF PROSTATE     2008    There were no vitals filed for this visit.  Subjective Assessment - 09/21/17 1153    Subjective  Remembered the exercise notebook yesterday, but did not bring it today.  Feels like back is a little better.    Currently in Pain?  No/denies    Pain Onset  More than a month ago                       [facility] Adult PT Treatment/Exercise - 09/21/17 0001      High Level Balance   High Level Balance Comments  Standing on compliant surface:  back step and weightshift x 10 reps each leg, then forward step and weightshift x 10 reps each leg, with cues for upright posture with forward step.  Forward lunge walk with coordinated arms, 25 ft x 2 reps, then step and stop activity with  reciprocal arm swing and Pause, for improved posture and balance.      Self-Care   Self-Care  Other Self-Care Comments    Other Self-Care Comments   Attempted to update medication on list (unable to accurately add new medication)-pt reports no longer taking regular Amantadine, but now transitioning to Gocovri (extended release form of Amantadine).  Discussed pt's on/off times (given pt's reports of forward lean during off times), and educated patient in journaling/documenting functional mobility with new medication.  Educated patient to avoid heavy lifting and yardwork tasks during this medication transition, as his functional mobility fluctuations may be different than he is used to.         [facility]) - 09/21/17 1157    PWR! exercises  Moves in standing    PWR! Up  x 20 reps    PWR! Rock  x 10 reps each side    PWR! Twist  x 10 reps each side    PWR Step  x 10 reps each side    Basic 4 Flow  PWR! Moves Flow in standing x 8 reps with visual and verbal cues for  technique.    Comments  PWR! Moves in standing performed on compliant blue mat surface          PT Education - 09/21/17 1238    Education Details  Awareness to functional mobility fluctuations with new medication-journaling/documenting in order to discuss with physician    Person(s) Educated  Patient    Methods  Explanation    Comprehension  Verbalized understanding          PT Long Term Goals - 09/09/17 1517      PT LONG TERM GOAL #1   Title  Pt will verbalize understanding of fall prevention in home environment.  TARGET 10/08/17    Time  5    Period  Weeks    Status  New    Target Date  10/08/17      PT LONG TERM GOAL #2   Title  Pt will improve MiniBESTest score to at least 25/28 for decreased fall risk.    Time  4    Period  Weeks    Status  New    Target Date  10/08/17      PT LONG TERM GOAL #3   Title  Pt will verbalize understanding of techniques to reduce festination/freezing of gait.    Time  5     Period  Weeks    Status  New    Target Date  10/08/17      PT LONG TERM GOAL #4   Title  Pt will be independent with progression of HEP to address balance, posture, and gait.    Time  4    Period  Weeks    Status  New    Target Date  10/08/17            Plan - 09/21/17 1239    Clinical Impression Statement  Pt did not bring in his HEP this visit for full review.  Pt does report that he had episode of forward lean with walking this morning, but not during PT session and he wonders about effects of new medication.  Addressed forward posture with standing and walking activities as well as education to journal/document how his mobility is fluctuating (on/off times) with the new medication to share with neurologist.    Rehab Potential  Good    PT Frequency  2x / week    PT Duration  4 weeks plus eval    PT Treatment/Interventions  ADLs/Self Care Home Management;Balance training;Therapeutic exercise;Therapeutic activities;Functional mobility training;Gait training;Neuromuscular re-education;Patient/family education    PT Next Visit Plan  Review standing PWR! MOves FLOW and work on compliant surface; discuss tips to reduce freezing with gait; gait with carrying tasks (simulating yard equipment); activities to address upright posture (to avoid forward lean with gait)    Consulted and Agree with Plan of Care  Patient       Patient will benefit from skilled therapeutic intervention in order to improve the following deficits and impairments:  Abnormal gait, Decreased balance, Decreased mobility, Difficulty walking, Postural dysfunction  Visit Diagnosis: Abnormal posture  Unsteadiness on feet  Other symptoms and signs involving the nervous system     Problem List Patient Active Problem List   Diagnosis Date Noted  . Parkinson disease ([place])     Cataldo Cosgriff W. 09/21/2017, 12:42 PM  Frazier Butt., PT   [facility] [address] [place], [name], [address] Phone: [phone]   Fax:  [phone]  Name: Ronald Noble MRN: [id number]  Date of Birth: 1945-04-29

## 2017-09-22 DIAGNOSIS — L565 Disseminated superficial actinic porokeratosis (DSAP): Secondary | ICD-10-CM | POA: Diagnosis not present

## 2017-09-22 DIAGNOSIS — L111 Transient acantholytic dermatosis [Grover]: Secondary | ICD-10-CM | POA: Diagnosis not present

## 2017-09-22 DIAGNOSIS — Z85828 Personal history of other malignant neoplasm of skin: Secondary | ICD-10-CM | POA: Diagnosis not present

## 2017-09-22 DIAGNOSIS — D1801 Hemangioma of skin and subcutaneous tissue: Secondary | ICD-10-CM | POA: Diagnosis not present

## 2017-09-22 DIAGNOSIS — L570 Actinic keratosis: Secondary | ICD-10-CM | POA: Diagnosis not present

## 2017-09-22 DIAGNOSIS — L218 Other seborrheic dermatitis: Secondary | ICD-10-CM | POA: Diagnosis not present

## 2017-09-22 DIAGNOSIS — L910 Hypertrophic scar: Secondary | ICD-10-CM | POA: Diagnosis not present

## 2017-09-22 DIAGNOSIS — D225 Melanocytic nevi of trunk: Secondary | ICD-10-CM | POA: Diagnosis not present

## 2017-09-28 ENCOUNTER — Encounter: Payer: Self-pay | Admitting: Occupational Therapy

## 2017-09-28 ENCOUNTER — Ambulatory Visit: Payer: Medicare Other | Admitting: Occupational Therapy

## 2017-09-28 ENCOUNTER — Ambulatory Visit: Payer: Medicare Other | Admitting: Physical Therapy

## 2017-09-28 DIAGNOSIS — M25612 Stiffness of left shoulder, not elsewhere classified: Secondary | ICD-10-CM

## 2017-09-28 DIAGNOSIS — R2681 Unsteadiness on feet: Secondary | ICD-10-CM

## 2017-09-28 DIAGNOSIS — R29818 Other symptoms and signs involving the nervous system: Secondary | ICD-10-CM

## 2017-09-28 DIAGNOSIS — R278 Other lack of coordination: Secondary | ICD-10-CM

## 2017-09-28 DIAGNOSIS — R2689 Other abnormalities of gait and mobility: Secondary | ICD-10-CM

## 2017-09-28 DIAGNOSIS — R471 Dysarthria and anarthria: Secondary | ICD-10-CM | POA: Diagnosis not present

## 2017-09-28 DIAGNOSIS — R293 Abnormal posture: Secondary | ICD-10-CM

## 2017-09-28 DIAGNOSIS — R29898 Other symptoms and signs involving the musculoskeletal system: Secondary | ICD-10-CM

## 2017-09-28 DIAGNOSIS — M25611 Stiffness of right shoulder, not elsewhere classified: Secondary | ICD-10-CM

## 2017-09-28 DIAGNOSIS — R41840 Attention and concentration deficit: Secondary | ICD-10-CM

## 2017-09-28 NOTE — Patient Instructions (Addendum)

## 2017-09-28 NOTE — Therapy (Signed)
[facility] [address] [place], [name], [address] Phone: [phone]   Fax:  [phone]  Physical Therapy Treatment  Patient Details  Name: Ronald Noble MRN: [id number] Date of Birth: 15-Aug-1945 Referring Provider: Rexene Alberts   Encounter Date: 09/28/2017  PT End of Session - 09/28/17 1303    Visit Number  7    Number of Visits  9    Date for PT Re-Evaluation  11/08/17    Authorization Type  Medicare/BCBS    PT Start Time  1018    PT Stop Time  1058    PT Time Calculation (min)  40 min    Activity Tolerance  Patient tolerated treatment well    Behavior During Therapy  [facility] for tasks assessed/performed       Past Medical History:  Diagnosis Date  . BPH (benign prostatic hyperplasia)   . Parkinson disease ([place])   . Skin cancer of face    arms and back  . Sleep apnea    mild  . Spider bite    brown recluse/ left ankle/08/13    Past Surgical History:  Procedure Laterality Date  . BURR HOLE W/ STEREOTACTIC INSERTION OF DBS LEADS / INTRAOP MICROELECTRODE RECORDING  04/2014  . COLONOSCOPY    . KNEE SURGERY     exploratory/left  . SKIN CANCER EXCISION  01/2014   face  . TRANSURETHRAL RESECTION OF PROSTATE     2008    There were no vitals filed for this visit.  Subjective Assessment - 09/28/17 1021    Subjective  Been doing okay, just working on my exercises and did yoga already today.    Currently in Pain?  No/denies    Pain Onset  More than a month ago                       [facility] Adult PT Treatment/Exercise - 09/28/17 0001      Ambulation/Gait   Ambulation/Gait  Yes    Ambulation/Gait Assistance  7: Independent    Ambulation Distance (Feet)  800 Feet    Assistive device  None    Gait Pattern  Step-through pattern;Trunk flexed    Ambulation Surface  Level;Indoor    Gait Comments  Gait with backward/forward 10 ft, then gait with head turns on outdoor surfaces for environmental  scanning.  Slight veering, but no LOB.      High Level Balance   High Level Balance Comments  Heel toe raises x 10 reps, with need for UE support; pt tries x 2 without UE support and experiences uncontrolled forward lean.  Single limb stance 3 reps x 10 seconds each leg, with intermittent UE support      Self-Care   Self-Care  Other Self-Care Comments    Other Self-Care Comments   Fall prevention education provided; review tips to reduce freezing episodes with gait and turns        [facility]) - 09/28/17 1037    PWR! exercises  Moves in standing    PWR! Up  x 10 reps     PWR! Rock  x 10 reps     PWR! Twist  x 10 reps, 2nd set with cues for "tapering off momentum" at end range for improved control.    PWR Step  x 10 reps forward; x 10 reps side    Basic 4 Flow  PWR! Moves Flow in standing:  PWR! Up>PWR! Rock>PWR! Twist>PWR! Step x 10 reps  Comments  PWR! Moves in standing-pt has episodes of forwrad lean with standing exercises, and requires cues for upright posture to maintain balance.          PT Education - 09/28/17 1303    Education Details  Reviewed tips to reduce freezing episodes with gait and turns, fall prevention education, review of HEP    Person(s) Educated  Patient    Methods  Explanation;Handout    Comprehension  Verbalized understanding;Returned demonstration;Verbal cues required          PT Long Term Goals - 09/28/17 1312      PT LONG TERM GOAL #1   Title  Pt will verbalize understanding of fall prevention in home environment.  TARGET 10/08/17    Time  5    Period  Weeks    Status  Achieved      PT LONG TERM GOAL #2   Title  Pt will improve MiniBESTest score to at least 25/28 for decreased fall risk.    Time  4    Period  Weeks    Status  New      PT LONG TERM GOAL #3   Title  Pt will verbalize understanding of techniques to reduce festination/freezing of gait.    Time  5    Period  Weeks    Status  Achieved      PT LONG TERM GOAL #4   Title  Pt  will be independent with progression of HEP to address balance, posture, and gait.    Time  4    Period  Weeks    Status  New            Plan - 09/28/17 1307    Clinical Impression Statement  With review of standing PWR! Moves HEP, pt has several epsiodes of forward lean, where he is able to catch his balance.  He also has uncontrolled forward lean with attempts at heel raises with no UE support.  Pt does not feel this is problematic, but he has not experienced this amount of forward posture in PT sessions before.  Discussed focused attention to grading his movements with standing PWR! Moves to avoid "overshooting" with too much effort.  Will assess LTGs next visit and discuss discharge versus continuing POC.    Rehab Potential  Good    PT Frequency  2x / week    PT Duration  4 weeks plus eval    PT Treatment/Interventions  ADLs/Self Care Home Management;Balance training;Therapeutic exercise;Therapeutic activities;Functional mobility training;Gait training;Neuromuscular re-education;Patient/family education    PT Next Visit Plan  Check LTGs and discuss discharge versus renew PT.    Consulted and Agree with Plan of Care  Patient       Patient will benefit from skilled therapeutic intervention in order to improve the following deficits and impairments:  Abnormal gait, Decreased balance, Decreased mobility, Difficulty walking, Postural dysfunction  Visit Diagnosis: Abnormal posture  Unsteadiness on feet  Other abnormalities of gait and mobility     Problem List Patient Active Problem List   Diagnosis Date Noted  . Parkinson disease ([place])     Eugene Isadore W. 09/28/2017, 1:14 PM Frazier Butt., PT  [facility] [address] [place], [name], [address] Phone: [phone]   Fax:  [phone]  Name: Ronald Noble MRN: [id number] Date of Birth: 10/15/1945

## 2017-09-28 NOTE — Therapy (Signed)
[facility] [address] [place], [name], [address] Phone: [phone]   Fax:  [phone]  Occupational Therapy Treatment  Patient Details  Name: Ronald Noble MRN: [id number] Date of Birth: 04/09/1945 Referring Provider: Rexene Alberts   Encounter Date: 09/28/2017  OT End of Session - 09/28/17 0938    Visit Number  6    Number of Visits  17    Date for OT Re-Evaluation  11/07/17    Authorization Type  Medicare    Authorization - Visit Number  6    Authorization - Number of Visits  10    OT Start Time  5993    OT Stop Time  1015    OT Time Calculation (min)  40 min    Activity Tolerance  Patient tolerated treatment well    Behavior During Therapy  [facility] for tasks assessed/performed       Past Medical History:  Diagnosis Date  . BPH (benign prostatic hyperplasia)   . Parkinson disease ([place])   . Skin cancer of face    arms and back  . Sleep apnea    mild  . Spider bite    brown recluse/ left ankle/08/13    Past Surgical History:  Procedure Laterality Date  . BURR HOLE W/ STEREOTACTIC INSERTION OF DBS LEADS / INTRAOP MICROELECTRODE RECORDING  04/2014  . COLONOSCOPY    . KNEE SURGERY     exploratory/left  . SKIN CANCER EXCISION  01/2014   face  . TRANSURETHRAL RESECTION OF PROSTATE     2008    There were no vitals filed for this visit.  Subjective Assessment - 09/28/17 0937    Subjective   back bothers me with certain movements    Pertinent History  PD diagnosed in 2008, DBS,     Patient Stated Goals  improve coordination    Currently in Pain?  No/denies          PWR! Moves (basic 4) in prone x 15-20 each with min cues For incr movement amplitude.  In standing, floor>overhead AAROM with ball and then diagonals to each side for large amplitude, trunk rotation, and weight shift, min cueing for movement and posture (avoid trunk hyperextension).    Sliding cards off table by using PWR! Hands with  focus on finger ext and min cues for incr movement amplitude.  Practiced fastening/unfastening buttons with min v.c. For use of large amplitude movement strategies--improvement noted.  Practiced writing (with review of strategies).  Pt able to write with approx 95% legibility and min decr in size.  Pt with incr difficulty at end of lines.  Reviewed proper way to stretch hand and to lift hand/move paper when he gets 1/2 way across page.  Pt verbalized understanding.  Continuous "l" with focus on spacing and size with min cueing.                  OT Short Term Goals - 09/10/17 5701      OT SHORT TERM GOAL #1   Title  Pt will be independent with updated PD specific HEP--10/08/17    Time  4    Period  Weeks    Status  New      OT SHORT TERM GOAL #2   Title  Pt will improve L hand coordination for ADLs as shown by improving time on 9-hole peg test by at least 4sec with LUE.    Baseline  L 48.25    Time  4    Period  Weeks    Status  New      OT SHORT TERM GOAL #3   Title  Pt will verbalize understanding of memory compensations and keeping thinking skills sharp    Time  4    Period  Weeks    Status  New      OT SHORT TERM GOAL #4   Title  Pt will be able to fasten/unfasten 3 button in less than 70 sec (for improved ability to dress and improved bilateral hand coordination).    Baseline  78 secs    Time  4    Period  Weeks    Status  New      OT SHORT TERM GOAL #5   Title  Pt will verbalize understanding of updated community resources prn.    Time  4    Period  Weeks    Status  New        OT Long Term Goals - 09/10/17 0109      OT LONG TERM GOAL #1   Title  Pt will verbalize understanding of adaptive strategies to incr ease with ADL's/IADLS's--LTGs due 06/05/33    Time  8    Period  Weeks    Status  New      OT LONG TERM GOAL #2   Title  Pt will demonstrate improved standing functional reach for ADLS by improving standing functional reach to 11 inches or  greater with RUE    Baseline  RUE 9.5, LUE 11.5    Time  8    Period  Weeks    Status  New      OT LONG TERM GOAL #3   Title  Pt will verbalize understanding of community exercise recommendations and ways to prevent future PD related complications.    Time  8    Period  Weeks    Status  New      OT LONG TERM GOAL #4   Title  Pt will demonstrate ability to retrieve a lightweight object at 135 shoulder flexion with -20 elbow ext. with RUE.    Time  8    Period  Weeks    Status  New            Plan - 09/28/17 5732    Occupational performance deficits (Please refer to evaluation for details):  ADL's;IADL's;Work;Leisure;Social Participation    Rehab Potential  Good    Current Impairments/barriers affecting progress:  length of time since onset, cognitive deficits    OT Frequency  2x / week    OT Duration  8 weeks    OT Treatment/Interventions  Self-care/ADL training;Therapeutic exercise;Patient/family education;Moist Heat;Energy conservation;Therapist, nutritional;Therapeutic activities;Balance training;Cognitive remediation/compensation;Passive range of motion;Manual Therapy;DME and/or AE instruction;Cryotherapy;Fluidtherapy    Plan  continue with adapted strategies for ADLs, memory strategies       Patient will benefit from skilled therapeutic intervention in order to improve the following deficits and impairments:  Abnormal gait, Decreased cognition, Impaired flexibility, Pain, Decreased mobility, Decreased coordination, Decreased endurance, Decreased range of motion, Decreased strength, Impaired UE functional use, Impaired perceived functional ability, Difficulty walking, Decreased safety awareness, Decreased knowledge of precautions, Decreased balance  Visit Diagnosis: Abnormal posture  Unsteadiness on feet  Other symptoms and signs involving the nervous system  Other lack of coordination  Other symptoms and signs involving the musculoskeletal system  Attention  and concentration deficit  Stiffness of right shoulder, not elsewhere classified  Stiffness of left shoulder,  not elsewhere classified  Other abnormalities of gait and mobility    Problem List Patient Active Problem List   Diagnosis Date Noted  . Parkinson disease [facility])     [facility] 09/28/2017, 12:28 PM  [place] [address] [place], [name], [address] Phone: [phone]   Fax:  [phone]  Name: Ronald Noble MRN: [id number] Date of Birth: [birth year]   Vianne Bulls, OTR/L [facility] [address] [place], [place]  [address] [phone] phone [phone] 09/28/17 12:28 PM

## 2017-09-29 ENCOUNTER — Ambulatory Visit: Payer: Medicare Other | Admitting: Occupational Therapy

## 2017-09-29 ENCOUNTER — Ambulatory Visit: Payer: Medicare Other | Admitting: Speech Pathology

## 2017-09-29 ENCOUNTER — Encounter: Payer: Self-pay | Admitting: Speech Pathology

## 2017-09-29 ENCOUNTER — Encounter: Payer: Medicare Other | Admitting: Speech Pathology

## 2017-09-29 DIAGNOSIS — R278 Other lack of coordination: Secondary | ICD-10-CM | POA: Diagnosis not present

## 2017-09-29 DIAGNOSIS — R41841 Cognitive communication deficit: Secondary | ICD-10-CM

## 2017-09-29 DIAGNOSIS — R29818 Other symptoms and signs involving the nervous system: Secondary | ICD-10-CM

## 2017-09-29 DIAGNOSIS — R293 Abnormal posture: Secondary | ICD-10-CM | POA: Diagnosis not present

## 2017-09-29 DIAGNOSIS — R471 Dysarthria and anarthria: Secondary | ICD-10-CM

## 2017-09-29 DIAGNOSIS — R29898 Other symptoms and signs involving the musculoskeletal system: Secondary | ICD-10-CM

## 2017-09-29 DIAGNOSIS — R41840 Attention and concentration deficit: Secondary | ICD-10-CM | POA: Diagnosis not present

## 2017-09-29 NOTE — Patient Instructions (Addendum)
  Shout slowly    SLOW LOUD OVER-ENNUNCIATE PAUSE  5 x each twice a day - slow shout - make each sound  BUTTERCUP  CATERPILLAR  [place]  SLOW AND BIG - EXAGGERATE YOUR MOUTH, MAKE EACH CONSONANT   The sick sixth Sheik's sixth sheep's sick  The instinct of an extinct insect stinks  Which wrist watches are [place] wrist watches?  Imagine managing the manager at an imaginary menagerie  Not many an anemone is enamored of an enemy anemone.  If a noisy noise annoys an onion, an annoying noisy noise annoys an onion more  Knapsack strap snap.  A bragging baker baked black bread  Short soldiers should shoot sufficiently straight  She should shun the shining sun  Three thugs thrushed thoughtfully through thickness  A three-toed tree toad loved a two-toed he-toad that lived in a too-tall tree  Seven salty sailors sailed the seven salty seas  Did you attend to it? Were you distracted  By something or someone? Did you process it in the 1st place? Did you have time to process? Or is it truly memory, meaning you knew it, had it in your brain, then forgot it.  When you hear volume reduce and rate speed up, stop, take a breath to re-set voice and speech  Use external marks when public speaking where you are going to pause, breath and re-set your rate and volume   Consider taking notes or using note cards for the main ideas of your public speaking. Still put a mark on the paper to help you remember slow shout  Practice reading 10 minutes, when you come to a comma or period - stop and take a breath

## 2017-09-29 NOTE — Therapy (Signed)
[facility] [address] [place], [name], [address] Phone: [phone]   Fax:  [phone]  Speech Language Pathology Treatment  Patient Details  Name: Waseem Suess MRN: [id number] Date of Birth: [birth year] Referring Provider: Star Age, MD   Encounter Date: 09/29/2017  End of Session - 09/29/17 1411    Visit Number  2    Number of Visits  17    Date for SLP Re-Evaluation  11/26/17    SLP Start Time  9767    SLP Stop Time   1400    SLP Time Calculation (min)  43 min    Activity Tolerance  Patient tolerated treatment well       Past Medical History:  Diagnosis Date  . BPH (benign prostatic hyperplasia)   . Parkinson disease ([place])   . Skin cancer of face    arms and back  . Sleep apnea    mild  . Spider bite    brown recluse/ left ankle/08/13    Past Surgical History:  Procedure Laterality Date  . BURR HOLE W/ STEREOTACTIC INSERTION OF DBS LEADS / INTRAOP MICROELECTRODE RECORDING  04/2014  . COLONOSCOPY    . KNEE SURGERY     exploratory/left  . SKIN CANCER EXCISION  01/2014   face  . TRANSURETHRAL RESECTION OF PROSTATE     2008    There were no vitals filed for this visit.  Subjective Assessment - 09/29/17 1324    Subjective  "I have been busy working and with the grandkids"    Currently in Pain?  No/denies            ADULT SLP TREATMENT - 09/29/17 1325      General Information   Behavior/Cognition  Alert;Cooperative;Pleasant mood      Treatment Provided   Treatment provided  Cognitive-Linquistic      Cognitive-Linquistic Treatment   Treatment focused on  Dysarthria    Skilled Treatment  Loud /a/ to recalibrate volume average of 92dB. Pt reports words running together. Instructed pt for "slow shout" Pt has been practicing tonuge twisters at home rapidly. I instructed him to do them slow and big if his concern is rapid, imprecise speech he needs to practice slow rate.  In  structured speech task, average 72dB with occasional min A. Conversation averaged 68-70dB with min visual cues for reduced rate and volume.       Assessment / Recommendations / Plan   Plan  Continue with current plan of care      Progression Toward Goals   Progression toward goals  Progressing toward goals       SLP Education - 09/29/17 1407    Education Details  Loud /a/, followed by reading, use external aids or marks to cue you to stop, breath, re-set volume and rate. If you are going to do tongue twisters, do them slow and loud    Person(s) Educated  Patient    Methods  Explanation;Demonstration;Verbal cues    Comprehension  Verbal cues required;Verbalized understanding;Need further instruction       SLP Short Term Goals - 09/29/17 1411      SLP SHORT TERM GOAL #1   Title  Pt will average low-mid 90sdB with loud /a/ for 3 sessions with rare min A    Time  4    Period  Weeks    Status  On-going      SLP SHORT TERM GOAL #2   Title  Pt will average  low-mid 70sdB on structured speech tasks with rare min A for three sessions    Time  4    Period  Weeks    Status  On-going      SLP SHORT TERM GOAL #3   Title  Pt will maintain average low 70s dB over 8 minute simple conversation with rare min A, for 2 sessions    Time  4    Period  Weeks    Status  New      SLP SHORT TERM GOAL #4   Title  pt will maintain speech intelligibility at 95%+ using intelligibility compensations for 8 minutes simple conversation in three sesssions    Time  4    Period  Weeks    Status  On-going      SLP SHORT TERM GOAL #5   Title  pt will demo knowledge of 4 different memory compenstions    Time  4    Period  Weeks    Status  On-going       SLP Long Term Goals - 09/29/17 1411      SLP LONG TERM GOAL #1   Title  Pt will maintain low 70sdB over 10 minute mod complex conversation with rare min A in three therapy sessions    Time  8    Period  Weeks or 17 sessions, for all LTGs    Status   On-going      SLP LONG TERM GOAL #2   Title  Pt will maintain 95%+ speech intelligibility in noisy environment outside of tx room over 12 minutes with rare min A, in three sessions    Time  8    Period  Weeks    Status  On-going      SLP LONG TERM GOAL #3   Title  pt will demo one memory compensation in 2 sessions     Time  8    Period  Weeks    Status  On-going      SLP LONG TERM GOAL #4   Title  pt will demo anticipatory awareness to independently double check his work in [employer] complex cognitive linguistic tasks in 4 sessions    Time  8    Period  Weeks    Status  On-going      SLP LONG TERM GOAL #5   Status  On-going       Plan - 09/29/17 1409    Clinical Impression Statement  Volume to day averaged 70dB with occasional to rare min A. He required ongoing cues for reduced rate and pauses. Some volume decay occurred with longer utterances, to 68dB, Continue skilled ST to maximize intelligiblity for QOL.    Speech Therapy Frequency  2x / week    Treatment/Interventions  SLP instruction and feedback;Oral motor exercises;Cueing hierarchy;Compensatory techniques;Cognitive reorganization;Functional tasks;Patient/family education;Multimodal communcation approach;Internal/external aids    Potential to Achieve Goals  Good       Patient will benefit from skilled therapeutic intervention in order to improve the following deficits and impairments:   Dysarthria and anarthria  Cognitive communication deficit    Problem List Patient Active Problem List   Diagnosis Date Noted  . Parkinson disease ([place])     Lovvorn, Annye Rusk MS, CCC-SLP 09/29/2017, 2:12 PM  [facility] [address] [place], [name], [address] Phone: [phone]   Fax:  [phone]   Name: Danielle Lento MRN: [id number] Date of Birth: Dec 02, 1945

## 2017-09-30 NOTE — Therapy (Signed)
[facility] [address] [place], [name], [address] Phone: [phone]   Fax:  [phone]  Occupational Therapy Treatment  Patient Details  Name: Ronald Noble MRN: [id number] Date of Birth: 06-17-1945 Referring Provider: Rexene Alberts   Encounter Date: 09/29/2017  OT End of Session - 09/30/17 0843    Visit Number  7    Number of Visits  17    Date for OT Re-Evaluation  11/07/17    Authorization Type  Medicare    Authorization - Visit Number  7    Authorization - Number of Visits  10    OT Start Time  7673    OT Stop Time  1445    OT Time Calculation (min)  40 min    Activity Tolerance  Patient tolerated treatment well    Behavior During Therapy  [facility] for tasks assessed/performed       Past Medical History:  Diagnosis Date  . BPH (benign prostatic hyperplasia)   . Parkinson disease ([place])   . Skin cancer of face    arms and back  . Sleep apnea    mild  . Spider bite    brown recluse/ left ankle/08/13    Past Surgical History:  Procedure Laterality Date  . BURR HOLE W/ STEREOTACTIC INSERTION OF DBS LEADS / INTRAOP MICROELECTRODE RECORDING  04/2014  . COLONOSCOPY    . KNEE SURGERY     exploratory/left  . SKIN CANCER EXCISION  01/2014   face  . TRANSURETHRAL RESECTION OF PROSTATE     2008    There were no vitals filed for this visit.  Subjective Assessment - 09/29/17 1407    Pertinent History  PD diagnosed in 2008, DBS,     Patient Stated Goals  improve coordination    Currently in Pain?  Yes    Pain Score  2     Pain Location  Back    Pain Descriptors / Indicators  Stabbing    Pain Type  Chronic pain    Pain Onset  More than a month ago    Pain Frequency  Intermittent    Aggravating Factors   malpositioning    Pain Relieving Factors  repositioning              Treatment: education provided regarding adapted strategies for fastening buttons, and feeding. Pt was encouraged to use PWR!  Hands and to let his utensil rest on the plate in between bites in order to slow down. Education regarding positioning at work station for work activities, importance of getting up to move frequently throughout the day and use of towel roll at low back. PWR! Hands x 10 reps prior to activities. Placing grooved pegs into pegboard with RUE, mod difficulty/ v.c for manipulating to correct position. Removing with in hand manipulation, mod difficulty.               OT Short Term Goals - 09/10/17 4193      OT SHORT TERM GOAL #1   Title  Pt will be independent with updated PD specific HEP--10/08/17    Time  4    Period  Weeks    Status  New      OT SHORT TERM GOAL #2   Title  Pt will improve L hand coordination for ADLs as shown by improving time on 9-hole peg test by at least 4sec with LUE.    Baseline  L 48.25    Time  4  Period  Weeks    Status  New      OT SHORT TERM GOAL #3   Title  Pt will verbalize understanding of memory compensations and keeping thinking skills sharp    Time  4    Period  Weeks    Status  New      OT SHORT TERM GOAL #4   Title  Pt will be able to fasten/unfasten 3 button in less than 70 sec (for improved ability to dress and improved bilateral hand coordination).    Baseline  78 secs    Time  4    Period  Weeks    Status  New      OT SHORT TERM GOAL #5   Title  Pt will verbalize understanding of updated community resources prn.    Time  4    Period  Weeks    Status  New        OT Long Term Goals - 09/10/17 1610      OT LONG TERM GOAL #1   Title  Pt will verbalize understanding of adaptive strategies to incr ease with ADL's/IADLS's--LTGs due 12/11/02    Time  8    Period  Weeks    Status  New      OT LONG TERM GOAL #2   Title  Pt will demonstrate improved standing functional reach for ADLS by improving standing functional reach to 11 inches or greater with RUE    Baseline  RUE 9.5, LUE 11.5    Time  8    Period  Weeks    Status  New       OT LONG TERM GOAL #3   Title  Pt will verbalize understanding of community exercise recommendations and ways to prevent future PD related complications.    Time  8    Period  Weeks    Status  New      OT LONG TERM GOAL #4   Title  Pt will demonstrate ability to retrieve a lightweight object at 135 shoulder flexion with -20 elbow ext. with RUE.    Time  8    Period  Weeks    Status  New            Plan - 09/30/17 5409    Clinical Impression Statement  Pt is progressing towards goals. He verbalizes understanding of adapted strategies for ADLS.    Rehab Potential  Good    Current Impairments/barriers affecting progress:  length of time since onset, cognitive deficits    OT Frequency  2x / week    OT Duration  8 weeks    OT Treatment/Interventions  Self-care/ADL training;Therapeutic exercise;Patient/family education;Moist Heat;Energy conservation;Therapist, nutritional;Therapeutic activities;Balance training;Cognitive remediation/compensation;Passive range of motion;Manual Therapy;DME and/or AE instruction;Cryotherapy;Fluidtherapy    Plan  memory strategies, work towards long term goals    Consulted and Agree with Plan of Care  Patient       Patient will benefit from skilled therapeutic intervention in order to improve the following deficits and impairments:  Abnormal gait, Decreased cognition, Impaired flexibility, Pain, Decreased mobility, Decreased coordination, Decreased endurance, Decreased range of motion, Decreased strength, Impaired UE functional use, Impaired perceived functional ability, Difficulty walking, Decreased safety awareness, Decreased knowledge of precautions, Decreased balance  Visit Diagnosis: Abnormal posture  Other symptoms and signs involving the nervous system  Other lack of coordination  Other symptoms and signs involving the musculoskeletal system  Attention and concentration deficit    Problem List Patient  Active Problem List    Diagnosis Date Noted  . Parkinson disease ([place])     RINE,KATHRYN 09/30/2017, 9:08 AM  [facility] [address] [place], [name], [address] Phone: [phone]   Fax:  [phone]  Name: Ronald Noble MRN: [id number] Date of Birth: 1945-06-25

## 2017-10-04 ENCOUNTER — Ambulatory Visit: Payer: Medicare Other | Attending: Internal Medicine | Admitting: Occupational Therapy

## 2017-10-04 ENCOUNTER — Ambulatory Visit: Payer: Medicare Other | Admitting: Physical Therapy

## 2017-10-04 ENCOUNTER — Encounter: Payer: Self-pay | Admitting: Physical Therapy

## 2017-10-04 DIAGNOSIS — R29818 Other symptoms and signs involving the nervous system: Secondary | ICD-10-CM | POA: Insufficient documentation

## 2017-10-04 DIAGNOSIS — R278 Other lack of coordination: Secondary | ICD-10-CM | POA: Diagnosis not present

## 2017-10-04 DIAGNOSIS — R293 Abnormal posture: Secondary | ICD-10-CM | POA: Diagnosis not present

## 2017-10-04 DIAGNOSIS — R471 Dysarthria and anarthria: Secondary | ICD-10-CM | POA: Insufficient documentation

## 2017-10-04 DIAGNOSIS — R29898 Other symptoms and signs involving the musculoskeletal system: Secondary | ICD-10-CM | POA: Diagnosis not present

## 2017-10-04 DIAGNOSIS — R2681 Unsteadiness on feet: Secondary | ICD-10-CM | POA: Diagnosis not present

## 2017-10-04 DIAGNOSIS — R41841 Cognitive communication deficit: Secondary | ICD-10-CM | POA: Insufficient documentation

## 2017-10-04 DIAGNOSIS — R41840 Attention and concentration deficit: Secondary | ICD-10-CM | POA: Insufficient documentation

## 2017-10-04 NOTE — Therapy (Signed)
[facility] [address] [place], [name], [address] Phone: [phone]   Fax:  [phone]  Physical Therapy Treatment  Patient Details  Name: Ronald Noble MRN: [id number] Date of Birth: 1945-06-29 Referring Provider: Rexene Alberts   Encounter Date: 10/04/2017  PT End of Session - 10/04/17 1131    Visit Number  8    Number of Visits  9    Date for PT Re-Evaluation  11/08/17    Authorization Type  Medicare/BCBS    PT Start Time  0850    PT Stop Time  0930    PT Time Calculation (min)  40 min    Activity Tolerance  Patient tolerated treatment well    Behavior During Therapy  [facility] for tasks assessed/performed       Past Medical History:  Diagnosis Date  . BPH (benign prostatic hyperplasia)   . Parkinson disease ([place])   . Skin cancer of face    arms and back  . Sleep apnea    mild  . Spider bite    brown recluse/ left ankle/08/13    Past Surgical History:  Procedure Laterality Date  . BURR HOLE W/ STEREOTACTIC INSERTION OF DBS LEADS / INTRAOP MICROELECTRODE RECORDING  04/2014  . COLONOSCOPY    . KNEE SURGERY     exploratory/left  . SKIN CANCER EXCISION  01/2014   face  . TRANSURETHRAL RESECTION OF PROSTATE     2008    There were no vitals filed for this visit.  Subjective Assessment - 10/04/17 0852    Subjective  Still doing the forward lurching some, but not as bad.  Can't stay in sitting position too long at desk.    Patient Stated Goals  Pt's goals for therapy to improve balance and try to get rid of the forward lean, improve the postural lean to the right.    Currently in Pain?  Yes    Pain Score  3     Pain Location  Back    Pain Orientation  Right;Left;Lower    Pain Descriptors / Indicators  Aching    Pain Type  Chronic pain    Pain Onset  More than a month ago    Pain Frequency  Intermittent    Aggravating Factors   positional pain    Pain Relieving Factors  repositioning, exercise              Mini-BESTest: Balance Evaluation Systems Test  2005-2013 [employer]. All rights reserved. ________________________________________________________________________________________Anticipatory_________Subscore____6_/6 1. SIT TO STAND Instruction: "Cross your arms across your chest. Try not to use your hands unless you must.Do not let your legs lean against the back of the chair when you stand. Please stand up now." X(2) Normal: Comes to stand without use of hands and stabilizes independently. (1) Moderate: Comes to stand WITH use of hands on first attempt. (0) Severe: Unable to stand up from chair without assistance, OR needs several attempts with use of hands. 2. RISE TO TOES Instruction: "Place your feet shoulder width apart. Place your hands on your hips. Try to rise as high as you can onto your toes. I will count out loud to 3 seconds. Try to hold this pose for at least 3 seconds. Look straight ahead. Rise now." X(2) Normal: Stable for 3 s with maximum height. (1) Moderate: Heels up, but not full range (smaller than when holding hands), OR noticeable instability for 3 s. (0) Severe: < 3 s. 3. STAND  ON ONE LEG Instruction: "Look straight ahead. Keep your hands on your hips. Lift your leg off of the ground behind you without touching or resting your raised leg upon your other standing leg. Stay standing on one leg as long as you can. Look straight ahead. Lift now." Left: Time in Seconds Trial 1:_____Trial 2:_____ X(2) Normal: 20 s. (1) Moderate: < 20 s. (0) Severe: Unable. Right: Time in Seconds Trial 1:_____Trial 2:_____ X(2) Normal: 20 s. (1) Moderate: < 20 s. (0) Severe: Unable To score each side separately use the trial with the longest time. To calculate the sub-score and total score use the side [left or right] with the lowest numerical score [i.e. the worse  side]. ______________________________________________________________________________________Reactive Postural Control___________Subscore:__6___/6 4. COMPENSATORY STEPPING CORRECTION- FORWARD Instruction: "Stand with your feet shoulder width apart, arms at your sides. Lean forward against my hands beyond your forward limits. When I let go, do whatever is necessary, including taking a step, to avoid a fall." X(2) Normal: Recovers independently with a single, large step (second realignment step is allowed). (1) Moderate: More than one step used to recover equilibrium. (0) Severe: No step, OR would fall if not caught, OR falls spontaneously. 5. COMPENSATORY STEPPING CORRECTION- BACKWARD Instruction: "Stand with your feet shoulder width apart, arms at your sides. Lean backward against my hands beyond your backward limits. When I let go, do whatever is necessary, including taking a step, to avoid a fall." X(2) Normal: Recovers independently with a single, large step. (1) Moderate: More than one step used to recover equilibrium. (0) Severe: No step, OR would fall if not caught, OR falls spontaneously. 6. COMPENSATORY STEPPING CORRECTION- LATERAL Instruction: "Stand with your feet together, arms down at your sides. Lean into my hand beyond your sideways limit. When I let go, do whatever is necessary, including taking a step, to avoid a fall." Left X(2) Normal: Recovers independently with 1 step (crossover or lateral OK). (1) Moderate: Several steps to recover equilibrium. (0) Severe: Falls, or cannot step. Right X(2) Normal: Recovers independently with 1 step (crossover or lateral OK). (1) Moderate: Several steps to recover equilibrium. (0) Severe: Falls, or cannot step. Use the side with the lowest score to calculate sub-score and total score. ____________________________________________________________________________________Sensory Orientation_____________Subscore:_______6__/6 7. STANCE  (FEET TOGETHER); EYES OPEN, FIRM SURFACE Instruction: "Place your hands on your hips. Place your feet together until almost touching. Look straight ahead. Be as stable and still as possible, until I say stop." Time in seconds:________ X(2) Normal: 30 s. (1) Moderate: < 30 s. (0) Severe: Unable. 8. STANCE (FEET TOGETHER); EYES CLOSED, FOAM SURFACE Instruction: "Step onto the foam. Place your hands on your hips. Place your feet together until almost touching. Be as stable and still as possible, until I say stop. I will start timing when you close your eyes." Time in seconds:________ X(2) Normal: 30 s. (1) Moderate: < 30 s. (0) Severe: Unable. 9. INCLINE- EYES CLOSED Instruction: "Step onto the incline ramp. Please stand on the incline ramp with your toes toward the top. Place your feet shoulder width apart and have your arms down at your sides. I will start timing when you close your eyes." Time in seconds:________ X(2) Normal: Stands independently 30 s and aligns with gravity. (1) Moderate: Stands independently <30 s OR aligns with surface. (0) Severe: Unable. _________________________________________________________________________________________Dynamic Gait ______Subscore_____9__/10 10. CHANGE IN GAIT SPEED Instruction: "Begin walking at your normal speed, when I tell you 'fast', walk as fast as you can. When I say 'slow', walk very slowly."  X(2) Normal: Significantly changes walking speed without imbalance. (1) Moderate: Unable to change walking speed or signs of imbalance. (0) Severe: Unable to achieve significant change in walking speed AND signs of imbalance. [place] - HORIZONTAL Instruction: "Begin walking at your normal speed, when I say "right", turn your head and look to the right. When I say "left" turn your head and look to the left. Try to keep yourself walking in a straight line." (2) Normal: performs head turns with no change in gait speed and good  balance. (1) Moderate: performs head turns with reduction in gait speed. (0) Severe: performs head turns with imbalance. 12. WALK WITH PIVOT TURNS Instruction: "Begin walking at your normal speed. When I tell you to 'turn and stop', turn as quickly as you can, face the opposite direction, and stop. After the turn, your feet should be close together." X(2) Normal: Turns with feet close FAST (< 3 steps) with good balance. (1) Moderate: Turns with feet close SLOW (>4 steps) with good balance. (0) Severe: Cannot turn with feet close at any speed without imbalance. 13. STEP OVER OBSTACLES Instruction: "Begin walking at your normal speed. When you get to the box, step over it, not around it and keep walking." (2) Normal: Able to step over box with minimal change of gait speed and with good balance. X(1) Moderate: Steps over box but touches box OR displays cautious behavior by slowing gait. (0) Severe: Unable to step over box OR steps around box. 14. TIMED UP & GO WITH DUAL TASK [3 METER WALK] Instruction TUG: "When I say 'Go', stand up from chair, walk at your normal speed across the tape on the floor, turn around, and come back to sit in the chair." Instruction TUG with Dual Task: "Count backwards by threes starting at ___. When I say 'Go', stand up from chair, walk at your normal speed across the tape on the floor, turn around, and come back to sit in the chair. Continue counting backwards the entire time." TUG: ________seconds; Dual Task TUG: ________seconds X(2) Normal: No noticeable change in sitting, standing or walking while backward counting when compared to TUG without Dual Task. (1) Moderate: Dual Task affects either counting OR walking (>10%) when compared to the TUG without Dual Task. (0) Severe: Stops counting while walking OR stops walking while counting. When scoring item 14, if subject's gait speed slows more than 10% between the TUG without and with a Dual Task the score should  be decreased by a point. TOTAL SCORE: ___27____/28        Gait velocity :  9.22 sec (3.56 ft/sec) TUG:  10.75 sec 5x sit<>stand:  8.50 sec   [facility] Adult PT Treatment/Exercise - 10/04/17 0001      High Level Balance   High Level Balance Comments  MiniBESTest score:  27/28 (See note for details)      Self-Care   Self-Care  Other Self-Care Comments;Posture    Posture  Posture/body mechanics with work station    Other Self-Care Comments   Reviewed fall prevention, discussed progress towards goals.  Discussed benefits of continued performance of PWR! Moves with HEP to address posture, rigidity and bradykinesia associated with his PD.  Provided informaiton for PWR! Moves weekly exercise classes.  Recommend either return screen or eval in 6-9 months (to be scheduled upon d/c from OT)        [facility]) - 10/04/17 1124    PWR! exercises  Moves in standing;Moves in sitting  PWR! Up  x 10 reps     PWR! Rock  x 10 reps    PWR! Twist  x 10 reps modified to reach across body    PWR Step  x 10 reps    Comments  Review of PWR! Moves standing HEP-pt return demo understanding; needs cues for upright posture to avoid forward lean          PT Education - 10/04/17 1130    Education Details  POC, goal check, d/c today; posture/body mechanics at work station, benefit of continued [employer], PWR! Moves weekly exercise class info    Person(s) Educated  Patient    Methods  Explanation;Demonstration;Handout    Comprehension  Verbalized understanding          PT Long Term Goals - 10/04/17 0859      PT LONG TERM GOAL #1   Title  Pt will verbalize understanding of fall prevention in home environment.  TARGET 10/08/17    Time  5    Period  Weeks    Status  Achieved      PT LONG TERM GOAL #2   Title  Pt will improve MiniBESTest score to at least 25/28 for decreased fall risk.    Time  4    Period  Weeks    Status  Achieved      PT LONG TERM GOAL #3   Title  Pt will verbalize  understanding of techniques to reduce festination/freezing of gait.    Time  5    Period  Weeks    Status  Achieved      PT LONG TERM GOAL #4   Title  Pt will be independent with progression of HEP to address balance, posture, and gait.    Time  4    Period  Weeks    Status  Achieved            Plan - 10/04/17 1131    Clinical Impression Statement  Assessed remaining LTGs this visit, with pt meeting all LTGs.  He has demonstrated understanding of HEP (needs cues for importance for consistency of performance of HEP) and has improved MiniBESTest score to 27/28 (closer to baseline of previous d/c measurement).  Pt continues to have wearing-off times with medications with associated postural changes, but have discussed and practiced various exercises to addres this.  Pt is appropriate for d/c at this time.    Rehab Potential  Good    PT Frequency  2x / week    PT Duration  4 weeks plus eval    PT Treatment/Interventions  ADLs/Self Care Home Management;Balance training;Therapeutic exercise;Therapeutic activities;Functional mobility training;Gait training;Neuromuscular re-education;Patient/family education    PT Next Visit Plan  Discharge this visit; plan return screen vs. eval in 6-9 months    Consulted and Agree with Plan of Care  Patient       Patient will benefit from skilled therapeutic intervention in order to improve the following deficits and impairments:  Abnormal gait, Decreased balance, Decreased mobility, Difficulty walking, Postural dysfunction  Visit Diagnosis: Abnormal posture  Unsteadiness on feet     Problem List Patient Active Problem List   Diagnosis Date Noted  . Parkinson disease ([place])     Aris Even W. 10/04/2017, 11:35 AM  Frazier Butt., PT   [facility] [address] [place], [name], [address] Phone: [phone]   Fax:  [phone]  Name: Ronald Noble MRN: [id number] Date  of Birth: Oct 17, 1945  PHYSICAL THERAPY DISCHARGE SUMMARY  Visits from Start of Care: 8  Current functional level related to goals / functional outcomes: PT Long Term Goals - 10/04/17 0859      PT LONG TERM GOAL #1   Title  Pt will verbalize understanding of fall prevention in home environment.  TARGET 10/08/17    Time  5    Period  Weeks    Status  Achieved      PT LONG TERM GOAL #2   Title  Pt will improve MiniBESTest score to at least 25/28 for decreased fall risk.    Time  4    Period  Weeks    Status  Achieved      PT LONG TERM GOAL #3   Title  Pt will verbalize understanding of techniques to reduce festination/freezing of gait.    Time  5    Period  Weeks    Status  Achieved      PT LONG TERM GOAL #4   Title  Pt will be independent with progression of HEP to address balance, posture, and gait.    Time  4    Period  Weeks    Status  Achieved      Pt has met all LTGs set at eval.   Remaining deficits: Posture/fluctuating mobility due to on/off times of medication   Education / Equipment: Educated in [employer], fall prevention, tips to reduce freezing with gait, community PWR! Moves exercise class  Plan: Patient agrees to discharge.  Patient goals were met. Patient is being discharged due to meeting the stated rehab goals.  ?????Plan return screen versus eval in 6-9 months due to progressive nature of disease.     Mady Haagensen, PT 10/04/17 11:39 AM Phone: [phone] Fax: [phone]

## 2017-10-04 NOTE — Patient Instructions (Signed)
Memory Compensation Strategies  1. Use "WARM" strategy.  W= write it down  A= associate it  R= repeat it  M= make a mental note  2.   You can keep a Social worker.  Use a 3-ring notebook with sections for the following: calendar, important names and phone numbers,  medications, doctors' names/phone numbers, lists/reminders, and a section to journal what you did  each day.   3.    Use a calendar to write appointments down.  4.    Write yourself a schedule for the day.  This can be placed on the calendar or in a separate section of the Memory Notebook.  Keeping a  regular schedule can help memory.  5.    Use medication organizer with sections for each day or morning/evening pills.  You may need help loading it  6.    Keep a basket, or pegboard by the door.  Place items that you need to take out with you in the basket or on the pegboard.  You may also want to  include a message board for reminders.  7.    Use sticky notes.  Place sticky notes with reminders in a place where the task is performed.  For example: " turn off the  stove" placed by the stove, "lock the door" placed on the door at eye level, " take your medications" on  the bathroom mirror or by the place where you normally take your medications.  8.    Use alarms/timers.  Use while cooking to remind yourself to check on food or as a reminder to take your medicine, or as a  reminder to make a call, or as a reminder to perform another task, etc.     Keeping Thinking Skills Sharp: 1. Jigsaw puzzles 2. Card/board games 3. Talking on the phone/social events 4. [employer] 5. Online games 6. Word searches/crossword puzzles 7.  Logic puzzles 8. Aerobic exercise (stationary bike) 9. Eating balanced diet (fruits & veggies) 10. Drink water 11. Try something new--new recipe, hobby 12. Crafts 13. Do a variety of activities that are challenging 14. Add cognitive activities to riding stationary bike (think of  animal/food/city with each letter of the alphabet, counting backwards, thinking of as many vegetables as you can, etc.).--Only do this  If safe (no freezing/falls).

## 2017-10-04 NOTE — Therapy (Signed)
[facility] [address] [place], [name], [address] Phone: [phone]   Fax:  [phone]  Occupational Therapy Treatment  Patient Details  Name: Tandy Lewin MRN: [id number] Date of Birth: [birth year] Referring Provider: Rexene Alberts   Encounter Date: 10/04/2017  OT End of Session - 10/04/17 0814    Visit Number  8    Number of Visits  17    Authorization Type  Medicare    Authorization - Visit Number  8    Authorization - Number of Visits  10    OT Start Time  0805    OT Stop Time  0845    OT Time Calculation (min)  40 min    Activity Tolerance  Patient tolerated treatment well    Behavior During Therapy  [facility] for tasks assessed/performed       Past Medical History:  Diagnosis Date  . BPH (benign prostatic hyperplasia)   . Parkinson disease ([place])   . Skin cancer of face    arms and back  . Sleep apnea    mild  . Spider bite    brown recluse/ left ankle/08/13    Past Surgical History:  Procedure Laterality Date  . BURR HOLE W/ STEREOTACTIC INSERTION OF DBS LEADS / INTRAOP MICROELECTRODE RECORDING  04/2014  . COLONOSCOPY    . KNEE SURGERY     exploratory/left  . SKIN CANCER EXCISION  01/2014   face  . TRANSURETHRAL RESECTION OF PROSTATE     2008    There were no vitals filed for this visit.  Subjective Assessment - 10/04/17 0813    Subjective   Pt reports mild back pain    Pertinent History  PD diagnosed in 2008, DBS,     Patient Stated Goals  improve coordination    Currently in Pain?  Yes    Pain Score  2     Pain Location  Back    Pain Descriptors / Indicators  Aching    Pain Type  Chronic pain    Pain Onset  More than a month ago    Pain Frequency  Intermittent    Aggravating Factors   malpositioning    Pain Relieving Factors  repositioning    Multiple Pain Sites  No            Treatment: Ambulating while tossing ball and performing category generation for dual tasking, min  v.c / mid difficulty.               OT Education - 10/04/17 1139    Education Details  PWR! basic 4 in quadraped 10-20 reps each , education regarding memory strategies and keeping thinking skills sharp, handout provided   Person(s) Educated  Patient    Methods  Explanation;Demonstration;Verbal cues    Comprehension  Verbalized understanding;Returned demonstration;Verbal cues required       OT Short Term Goals - 10/04/17 0831      OT SHORT TERM GOAL #1   Title  Pt will be independent with updated PD specific HEP--10/08/17    Status  Achieved      OT SHORT TERM GOAL #2   Title  Pt will improve L hand coordination for ADLs as shown by improving time on 9-hole peg test by at least 4sec with LUE.    Status  Achieved 37.94      OT SHORT TERM GOAL #3   Title  Pt will verbalize understanding of memory compensations and keeping thinking skills sharp  Status  Achieved      OT SHORT TERM GOAL #4   Title  Pt will be able to fasten/unfasten 3 button in less than 70 sec (for improved ability to dress and improved bilateral hand coordination).    Status  Achieved 38.56      OT SHORT TERM GOAL #5   Title  Pt will verbalize understanding of updated community resources prn.    Status  On-going        OT Long Term Goals - 09/10/17 9767      OT LONG TERM GOAL #1   Title  Pt will verbalize understanding of adaptive strategies to incr ease with ADL's/IADLS's--LTGs due 06/07/17    Time  8    Period  Weeks    Status  New      OT LONG TERM GOAL #2   Title  Pt will demonstrate improved standing functional reach for ADLS by improving standing functional reach to 11 inches or greater with RUE    Baseline  RUE 9.5, LUE 11.5    Time  8    Period  Weeks    Status  New      OT LONG TERM GOAL #3   Title  Pt will verbalize understanding of community exercise recommendations and ways to prevent future PD related complications.    Time  8    Period  Weeks    Status  New      OT LONG  TERM GOAL #4   Title  Pt will demonstrate ability to retrieve a lightweight object at 135 shoulder flexion with -20 elbow ext. with RUE.    Time  8    Period  Weeks    Status  New            Plan - 10/04/17 1141    Clinical Impression Statement  Pt is progressing towards goals. He made excellent progress towards short term goals.    Occupational Profile and client history currently impacting functional performance  Pt works full time as an Forensic psychologist, he reports decreased dexterity and difficulty with handwritiing PMH: PD diagnosed in 2008, s/p bilateral DBS, skin cancer, sleep apnea, BPH, TURP, orthostatic hypotension, syncopal episodes; arthritic changes in spine    Occupational performance deficits (Please refer to evaluation for details):  ADL's;IADL's;Work;Leisure;Social Participation    Rehab Potential  Good    Current Impairments/barriers affecting progress:  length of time since onset, cognitive deficits    OT Frequency  2x / week    OT Duration  8 weeks    OT Treatment/Interventions  Self-care/ADL training;Therapeutic exercise;Patient/family education;Moist Heat;Energy conservation;Therapist, nutritional;Therapeutic activities;Balance training;Cognitive remediation/compensation;Passive range of motion;Manual Therapy;DME and/or AE instruction;Cryotherapy;Fluidtherapy    Plan  work towards long term goals, anticipate d/c in the next few visits    Consulted and Agree with Plan of Care  Patient       Patient will benefit from skilled therapeutic intervention in order to improve the following deficits and impairments:  Abnormal gait, Decreased cognition, Impaired flexibility, Pain, Decreased mobility, Decreased coordination, Decreased endurance, Decreased range of motion, Decreased strength, Impaired UE functional use, Impaired perceived functional ability, Difficulty walking, Decreased safety awareness, Decreased knowledge of precautions, Decreased balance  Visit  Diagnosis: Abnormal posture  Other symptoms and signs involving the nervous system  Other lack of coordination  Other symptoms and signs involving the musculoskeletal system  Attention and concentration deficit  Unsteadiness on feet    Problem List Patient Active Problem List  Diagnosis Date Noted  . Parkinson disease ([place])     Denard Tuminello 10/04/2017, 11:43 AM  [facility] [address] [place], [name], [address] Phone: [phone]   Fax:  [phone]  Name: Crescencio Jozwiak MRN: [id number] Date of Birth: [birth year]

## 2017-10-06 ENCOUNTER — Ambulatory Visit: Payer: Medicare Other | Admitting: Occupational Therapy

## 2017-10-06 ENCOUNTER — Ambulatory Visit: Payer: Medicare Other | Admitting: Physical Therapy

## 2017-10-06 DIAGNOSIS — R293 Abnormal posture: Secondary | ICD-10-CM

## 2017-10-06 DIAGNOSIS — R29818 Other symptoms and signs involving the nervous system: Secondary | ICD-10-CM | POA: Diagnosis not present

## 2017-10-06 DIAGNOSIS — R29898 Other symptoms and signs involving the musculoskeletal system: Secondary | ICD-10-CM | POA: Diagnosis not present

## 2017-10-06 DIAGNOSIS — R41840 Attention and concentration deficit: Secondary | ICD-10-CM | POA: Diagnosis not present

## 2017-10-06 DIAGNOSIS — R278 Other lack of coordination: Secondary | ICD-10-CM | POA: Diagnosis not present

## 2017-10-06 DIAGNOSIS — R2681 Unsteadiness on feet: Secondary | ICD-10-CM | POA: Diagnosis not present

## 2017-10-06 NOTE — Therapy (Signed)
[facility] [address] [place], [name], [address] Phone: [phone]   Fax:  [phone]  Occupational Therapy Treatment  Patient Details  Name: Ronald Noble MRN: [id number] Date of Birth: [birth year] Referring Provider: Rexene Alberts   Encounter Date: 10/06/2017  OT End of Session - 10/06/17 1114    Visit Number  9    Number of Visits  17    Date for OT Re-Evaluation  11/07/17    Authorization Type  Medicare    Authorization - Visit Number  9    Authorization - Number of Visits  10    OT Start Time  0805 d/c visit, 2 units    OT Stop Time  0840    OT Time Calculation (min)  35 min    Activity Tolerance  Patient tolerated treatment well    Behavior During Therapy  [facility] for tasks assessed/performed       Past Medical History:  Diagnosis Date  . BPH (benign prostatic hyperplasia)   . Parkinson disease ([place])   . Skin cancer of face    arms and back  . Sleep apnea    mild  . Spider bite    brown recluse/ left ankle/08/13    Past Surgical History:  Procedure Laterality Date  . BURR HOLE W/ STEREOTACTIC INSERTION OF DBS LEADS / INTRAOP MICROELECTRODE RECORDING  04/2014  . COLONOSCOPY    . KNEE SURGERY     exploratory/left  . SKIN CANCER EXCISION  01/2014   face  . TRANSURETHRAL RESECTION OF PROSTATE     2008    There were no vitals filed for this visit.  Subjective Assessment - 10/06/17 1112    Pertinent History  PD diagnosed in 2008, DBS,     Patient Stated Goals  improve coordination    Currently in Pain?  No/denies                treatment: checked progress towards goals, see goals for updates. Discussed use of towel roll/ lumbar support in chair for improved upright posture at his desk. Therapist recommends pt goes to an office supply store to try out various options. Pt practiced rotating ball in bilateral UE's min v.c, mod difficulty.            OT Education - 10/06/17 1112     Education Details  Ways to prevent future PD complications, recommendations regarding community exercise(avoiding pull ups, limit biceps curls) [employer]! moves class, recommendation that pt does not climb ladders due to increased fall risk    Person(s) Educated  Patient    Methods  Explanation;Demonstration;Verbal cues    Comprehension  Verbalized understanding       OT Short Term Goals - 10/06/17 0805      OT SHORT TERM GOAL #1   Title  Pt will be independent with updated PD specific HEP--10/08/17    Status  Achieved      OT SHORT TERM GOAL #2   Title  Pt will improve L hand coordination for ADLs as shown by improving time on 9-hole peg test by at least 4sec with LUE.    Status  Achieved      OT SHORT TERM GOAL #3   Title  Pt will verbalize understanding of memory compensations and keeping thinking skills sharp    Status  Achieved      OT SHORT TERM GOAL #4   Title  Pt will be able to fasten/unfasten 3 button in less than  70 sec (for improved ability to dress and improved bilateral hand coordination).    Status  Achieved      OT SHORT TERM GOAL #5   Title  Pt will verbalize understanding of updated community resources prn.    Status  Achieved        OT Long Term Goals - 10/06/17 3875      OT LONG TERM GOAL #1   Title  Pt will verbalize understanding of adaptive strategies to incr ease with ADL's/IADLS's--LTGs due 09/08/31    Status  Achieved      OT LONG TERM GOAL #2   Title  Pt will demonstrate improved standing functional reach for ADLS by improving standing functional reach to 11 inches or greater with RUE    Status  Achieved 11 inches      OT LONG TERM GOAL #3   Title  Pt will verbalize understanding of community exercise recommendations and ways to prevent future PD related complications.    Status  Achieved      OT LONG TERM GOAL #4   Title  Pt will demonstrate ability to retrieve a lightweight object at 135 shoulder flexion with -20 elbow ext. with RUE.    Status   Achieved 135 shoulder flexion, -5            Plan - 10/06/17 1116    Clinical Impression Statement  Pt demonstrates excellent overall progress. He agrees with plans for d/c    Rehab Potential  Good    Current Impairments/barriers affecting progress:  length of time since onset, cognitive deficits    OT Frequency  2x / week    OT Duration  8 weeks    OT Treatment/Interventions  Self-care/ADL training;Therapeutic exercise;Patient/family education;Moist Heat;Energy conservation;Therapist, nutritional;Therapeutic activities;Balance training;Cognitive remediation/compensation;Passive range of motion;Manual Therapy;DME and/or AE instruction;Cryotherapy;Fluidtherapy    Plan  d/c OT recommend screen in 6-8 mons    Consulted and Agree with Plan of Care  Patient       Patient will benefit from skilled therapeutic intervention in order to improve the following deficits and impairments:  Abnormal gait, Decreased cognition, Impaired flexibility, Pain, Decreased mobility, Decreased coordination, Decreased endurance, Decreased range of motion, Decreased strength, Impaired UE functional use, Impaired perceived functional ability, Difficulty walking, Decreased safety awareness, Decreased knowledge of precautions, Decreased balance  Visit Diagnosis: Other lack of coordination  Abnormal posture  Unsteadiness on feet  Other symptoms and signs involving the nervous system  Other symptoms and signs involving the musculoskeletal system   OCCUPATIONAL THERAPY DISCHARGE SUMMARY    Current functional level related to goals / functional outcomes: Pt made good overall progress see above.   Remaining deficits: Decreased coordination, decreased balance, cognitive deficits, rigidity, abnormal posture   Education / Equipment: Pt was educated regarding :PD specific HEP, adapted strategies for ADLs, community fitness recommendations, ways to prevent future complications and safety recommendations(no  use of ladders) Pt verbalizes understanding of all recommendations.  Plan: Patient agrees to discharge.  Patient goals were met. Patient is being discharged due to meeting the stated rehab goals.  ?????     Problem List Patient Active Problem List   Diagnosis Date Noted  . Parkinson disease ([place])     Ronald Noble 10/06/2017, 11:25 AM Theone Murdoch, OTR/L [phone] Phone: [phone] 11:25 AM 10/06/17 [facility] [address] [place], [name], [address] Phone: [phone]   Fax:  [phone]  Name: Ronald Noble MRN: [id number] Date of Birth:  01/04/1946 

## 2017-10-06 NOTE — Patient Instructions (Signed)

## 2017-10-13 ENCOUNTER — Ambulatory Visit: Payer: Medicare Other | Admitting: Physical Therapy

## 2017-10-13 ENCOUNTER — Encounter: Payer: Medicare Other | Admitting: Speech Pathology

## 2017-10-13 ENCOUNTER — Encounter: Payer: Medicare Other | Admitting: Occupational Therapy

## 2017-10-14 ENCOUNTER — Encounter: Payer: Medicare Other | Admitting: Occupational Therapy

## 2017-10-19 ENCOUNTER — Ambulatory Visit: Payer: Medicare Other

## 2017-10-19 ENCOUNTER — Ambulatory Visit: Payer: Medicare Other | Admitting: Physical Therapy

## 2017-10-19 ENCOUNTER — Encounter: Payer: Medicare Other | Admitting: Occupational Therapy

## 2017-10-19 DIAGNOSIS — R41840 Attention and concentration deficit: Secondary | ICD-10-CM | POA: Diagnosis not present

## 2017-10-19 DIAGNOSIS — R293 Abnormal posture: Secondary | ICD-10-CM | POA: Diagnosis not present

## 2017-10-19 DIAGNOSIS — R278 Other lack of coordination: Secondary | ICD-10-CM | POA: Diagnosis not present

## 2017-10-19 DIAGNOSIS — R41841 Cognitive communication deficit: Secondary | ICD-10-CM

## 2017-10-19 DIAGNOSIS — R29818 Other symptoms and signs involving the nervous system: Secondary | ICD-10-CM | POA: Diagnosis not present

## 2017-10-19 DIAGNOSIS — R2681 Unsteadiness on feet: Secondary | ICD-10-CM | POA: Diagnosis not present

## 2017-10-19 DIAGNOSIS — R29898 Other symptoms and signs involving the musculoskeletal system: Secondary | ICD-10-CM | POA: Diagnosis not present

## 2017-10-19 DIAGNOSIS — R471 Dysarthria and anarthria: Secondary | ICD-10-CM

## 2017-10-19 NOTE — Therapy (Signed)
[facility] [address] [place], [name], [address] Phone: [phone]   Fax:  [phone]  Speech Language Pathology Treatment  Patient Details  Name: Ronald Noble MRN: [id number] Date of Birth: 1946/01/26 Referring Provider: Star Age, MD   Encounter Date: 10/19/2017  End of Session - 10/19/17 1056    Visit Number  3    Number of Visits  17    Date for SLP Re-Evaluation  11/26/17    SLP Start Time  0852 pt late    SLP Stop Time   0930    SLP Time Calculation (min)  38 min    Activity Tolerance  Patient tolerated treatment well       Past Medical History:  Diagnosis Date  . BPH (benign prostatic hyperplasia)   . Parkinson disease ([place])   . Skin cancer of face    arms and back  . Sleep apnea    mild  . Spider bite    brown recluse/ left ankle/08/13    Past Surgical History:  Procedure Laterality Date  . BURR HOLE W/ STEREOTACTIC INSERTION OF DBS LEADS / INTRAOP MICROELECTRODE RECORDING  04/2014  . COLONOSCOPY    . KNEE SURGERY     exploratory/left  . SKIN CANCER EXCISION  01/2014   face  . TRANSURETHRAL RESECTION OF PROSTATE     2008    There were no vitals filed for this visit.  Subjective Assessment - 10/19/17 0858    Subjective  "She cups her hand to her ear when she can't understand me."     Currently in Pain?  No/denies            ADULT SLP TREATMENT - 10/19/17 0859      General Information   Behavior/Cognition  Alert;Cooperative;Pleasant mood      Treatment Provided   Treatment provided  Cognitive-Linquistic      Cognitive-Linquistic Treatment   Treatment focused on  Dysarthria    Skilled Treatment  SLP targeted pt's speech intelligibility. Pt admitted to inconsistency/noncompliance with regularity of practice since last session.  Loud /a/ today was completed with average lower 90s dB. SLP talked about/educated pt on necessity of consistency with homework and with  loud /a/ in order to habitualize louder speech. SLP provided pt pictures and pt described them with average mid-upper 60s dB. When SLP had pt tell SLP about each individual picture prior to arranging and describing, pt's average volume increased to WNL. Pt with tangential comments/conversation during tasks but was easily redirected by SLP.      Assessment / Recommendations / Plan   Plan  Continue with current plan of care      Progression Toward Goals   Progression toward goals  Progressing toward goals       SLP Education - 10/19/17 1055    Education Details  necessity of consistency with homework and loud /a/ for postitive change to conversational loudness    Person(s) Educated  Patient    Methods  Explanation    Comprehension  Verbalized understanding       SLP Short Term Goals - 10/19/17 1058      SLP SHORT TERM GOAL #1   Title  Pt will average low-mid 90sdB with loud /a/ for 3 sessions with rare min A    Time  3    Period  Weeks    Status  On-going      SLP SHORT TERM GOAL #2   Title  Pt  will average low-mid 70sdB on structured speech tasks with rare min A for three sessions    Time  3    Period  Weeks    Status  On-going      SLP SHORT TERM GOAL #3   Title  Pt will maintain average low 70s dB over 8 minute simple conversation with rare min A, for 2 sessions    Time  3    Period  Weeks    Status  On-going      SLP SHORT TERM GOAL #4   Title  pt will maintain speech intelligibility at 95%+ using intelligibility compensations for 8 minutes simple conversation in three sesssions    Time  3    Period  Weeks    Status  On-going      SLP SHORT TERM GOAL #5   Title  pt will demo knowledge of 4 different memory compenstions    Time  3    Period  Weeks    Status  On-going       SLP Long Term Goals - 10/19/17 1058      SLP LONG TERM GOAL #1   Title  Pt will maintain low 70sdB over 10 minute mod complex conversation with rare min A in three therapy sessions    Time   7    Period  Weeks or 17 sessions, for all LTGs    Status  On-going      SLP LONG TERM GOAL #2   Title  Pt will maintain 95%+ speech intelligibility in noisy environment outside of tx room over 12 minutes with rare min A, in three sessions    Time  7    Period  Weeks    Status  On-going      SLP LONG TERM GOAL #3   Title  pt will demo one memory compensation in 2 sessions     Time  7    Period  Weeks    Status  On-going      SLP LONG TERM GOAL #4   Title  pt will demo anticipatory awareness to independently double check his work in [employer] complex cognitive linguistic tasks in 4 sessions    Time  7    Period  Weeks    Status  On-going      SLP LONG TERM GOAL #5   Status  On-going       Plan - 10/19/17 1056    Clinical Impression Statement  Volume averaged 70dB in rehearsed sentence stimuli.  Ongoing cues necessary for incr'd volume, and reduced rate and pauses ("loud and clear"). Continue skilled ST to maximize intelligiblity for QOL.    Speech Therapy Frequency  2x / week    Duration  -- 8 weeks, 17 total sessions    Treatment/Interventions  SLP instruction and feedback;Oral motor exercises;Cueing hierarchy;Compensatory techniques;Cognitive reorganization;Functional tasks;Patient/family education;Multimodal communcation approach;Internal/external aids    Potential to Achieve Goals  Good       Patient will benefit from skilled therapeutic intervention in order to improve the following deficits and impairments:   Dysarthria and anarthria  Cognitive communication deficit    Problem List Patient Active Problem List   Diagnosis Date Noted  . Parkinson disease ([place])     [place] ,[place], [place]  10/19/2017, 10:59 AM  [facility] [address] [place], [name], [address] Phone: [phone]   Fax:  [phone]   Name: Ronald Noble MRN: [id number] Date of Birth:  09/02/1945 

## 2017-10-19 NOTE — Patient Instructions (Signed)
  Please complete the assigned speech therapy homework prior to your next session and return it to the speech therapist at your next visit.  

## 2017-10-21 ENCOUNTER — Ambulatory Visit: Payer: Medicare Other | Admitting: Physical Therapy

## 2017-10-21 ENCOUNTER — Encounter: Payer: Medicare Other | Admitting: Occupational Therapy

## 2017-10-21 ENCOUNTER — Ambulatory Visit: Payer: Medicare Other

## 2017-10-21 DIAGNOSIS — R2681 Unsteadiness on feet: Secondary | ICD-10-CM | POA: Diagnosis not present

## 2017-10-21 DIAGNOSIS — R41841 Cognitive communication deficit: Secondary | ICD-10-CM

## 2017-10-21 DIAGNOSIS — R29818 Other symptoms and signs involving the nervous system: Secondary | ICD-10-CM | POA: Diagnosis not present

## 2017-10-21 DIAGNOSIS — R278 Other lack of coordination: Secondary | ICD-10-CM | POA: Diagnosis not present

## 2017-10-21 DIAGNOSIS — R471 Dysarthria and anarthria: Secondary | ICD-10-CM

## 2017-10-21 DIAGNOSIS — R29898 Other symptoms and signs involving the musculoskeletal system: Secondary | ICD-10-CM | POA: Diagnosis not present

## 2017-10-21 DIAGNOSIS — R293 Abnormal posture: Secondary | ICD-10-CM | POA: Diagnosis not present

## 2017-10-21 DIAGNOSIS — R41840 Attention and concentration deficit: Secondary | ICD-10-CM | POA: Diagnosis not present

## 2017-10-22 NOTE — Therapy (Signed)
[facility] [address] [place], [name], [address] Phone: [phone]   Fax:  [phone]  Speech Language Pathology Treatment  Patient Details  Name: Ronald Noble MRN: [id number] Date of Birth: October 11, 1945 Referring Provider: Star Age, MD   Encounter Date: 10/21/2017  End of Session - 10/22/17 1629    Visit Number  4    Number of Visits  17    Date for SLP Re-Evaluation  11/26/17    SLP Start Time  1022 pt late     SLP Stop Time   1100    SLP Time Calculation (min)  38 min    Activity Tolerance  Patient tolerated treatment well       Past Medical History:  Diagnosis Date  . BPH (benign prostatic hyperplasia)   . Parkinson disease ([place])   . Skin cancer of face    arms and back  . Sleep apnea    mild  . Spider bite    brown recluse/ left ankle/08/13    Past Surgical History:  Procedure Laterality Date  . BURR HOLE W/ STEREOTACTIC INSERTION OF DBS LEADS / INTRAOP MICROELECTRODE RECORDING  04/2014  . COLONOSCOPY    . KNEE SURGERY     exploratory/left  . SKIN CANCER EXCISION  01/2014   face  . TRANSURETHRAL RESECTION OF PROSTATE     2008    There were no vitals filed for this visit.  Subjective Assessment - 10/21/17 1031    Subjective  "I've been going back to two pills per day on the Amantadine"    Currently in Pain?  No/denies            ADULT SLP TREATMENT - 10/22/17 0001      General Information   Behavior/Cognition  Alert;Cooperative;Pleasant mood      Treatment Provided   Treatment provided  Cognitive-Linquistic      Cognitive-Linquistic Treatment   Treatment focused on  Dysarthria    Skilled Treatment  SLP reiterated entire session about consistency with practice is key in changing behavior in rushed speech and soft speech. SLP targeted pt's speech intelligibility. Pt admitted to inconsistency/noncompliance with regularity of practice since last session.  Loud /a/  today was completed with average lower 90s dB. SLP talked about/educated pt on necessity of consistency with homework and with loud /a/ in order to habitualize louder speech. SLP provided pt pictures and pt described them with average mid-upper 60s dB. When SLP had pt tell SLP about each individual picture prior to arranging and describing, pt's average volume increased to WNL. Pt with tangential comments/conversation during tasks but was easily redirected by SLP.       Assessment / Recommendations / Plan   Plan  Continue with current plan of care      Progression Toward Goals   Progression toward goals  Progressing toward goals         SLP Short Term Goals - 10/22/17 1631      SLP SHORT TERM GOAL #1   Title  Pt will average low-mid 90sdB with loud /a/ for 3 sessions with rare min A    Time  3    Period  Weeks    Status  On-going      SLP SHORT TERM GOAL #2   Title  Pt will average low-mid 70sdB on structured speech tasks with rare min A for three sessions    Time  3    Period  Weeks  Status  On-going      SLP SHORT TERM GOAL #3   Title  Pt will maintain average low 70s dB over 8 minute simple conversation with rare min A, for 2 sessions    Time  3    Period  Weeks    Status  On-going      SLP SHORT TERM GOAL #4   Title  pt will maintain speech intelligibility at 95%+ using intelligibility compensations for 8 minutes simple conversation in three sesssions    Time  3    Period  Weeks    Status  On-going      SLP SHORT TERM GOAL #5   Title  pt will demo knowledge of 4 different memory compenstions    Time  3    Period  Weeks    Status  On-going       SLP Long Term Goals - 10/22/17 1631      SLP LONG TERM GOAL #1   Title  Pt will maintain low 70sdB over 10 minute mod complex conversation with rare min A in three therapy sessions    Time  7    Period  Weeks or 17 sessions, for all LTGs    Status  On-going      SLP LONG TERM GOAL #2   Title  Pt will maintain 95%+  speech intelligibility in noisy environment outside of tx room over 12 minutes with rare min A, in three sessions    Time  7    Period  Weeks    Status  On-going      SLP LONG TERM GOAL #3   Title  pt will demo one memory compensation in 2 sessions     Time  7    Period  Weeks    Status  On-going      SLP LONG TERM GOAL #4   Title  pt will demo anticipatory awareness to independently double check his work in [employer] complex cognitive linguistic tasks in 4 sessions    Time  7    Period  Weeks    Status  On-going      SLP LONG TERM GOAL #5   Status  On-going       Plan - 10/22/17 1630    Clinical Impression Statement  Pt was reminded throughout session of consistency necessary in homework, and in loud /a/ for maintaining clear, intelligible speech. Ongoing cues necessary for incr'd volume, and reduced rate and pauses ("loud and clear"). Continue skilled ST to maximize intelligiblity for QOL.    Speech Therapy Frequency  2x / week    Duration  -- 8 weeks, 17 total sessions    Treatment/Interventions  SLP instruction and feedback;Oral motor exercises;Cueing hierarchy;Compensatory techniques;Cognitive reorganization;Functional tasks;Patient/family education;Multimodal communcation approach;Internal/external aids    Potential to Achieve Goals  Good       Patient will benefit from skilled therapeutic intervention in order to improve the following deficits and impairments:   Dysarthria and anarthria  Cognitive communication deficit    Problem List Patient Active Problem List   Diagnosis Date Noted  . Parkinson disease ([place])     [place] ,[place], [place]  10/22/2017, 4:32 PM  [facility] [address] [place], [name], [address] Phone: [phone]   Fax:  [phone]   Name: Nikoloz Huy MRN: [id number] Date of Birth: 02-05-1946

## 2017-10-26 ENCOUNTER — Ambulatory Visit: Payer: Medicare Other | Admitting: Physical Therapy

## 2017-10-26 ENCOUNTER — Ambulatory Visit (INDEPENDENT_AMBULATORY_CARE_PROVIDER_SITE_OTHER): Payer: Medicare Other | Admitting: Neurology

## 2017-10-26 ENCOUNTER — Encounter: Payer: Self-pay | Admitting: Neurology

## 2017-10-26 ENCOUNTER — Encounter: Payer: Medicare Other | Admitting: Occupational Therapy

## 2017-10-26 ENCOUNTER — Ambulatory Visit: Payer: Medicare Other | Admitting: Speech Pathology

## 2017-10-26 ENCOUNTER — Encounter: Payer: Self-pay | Admitting: Speech Pathology

## 2017-10-26 VITALS — BP 138/87 | HR 67 | Ht 71.0 in | Wt 157.0 lb

## 2017-10-26 DIAGNOSIS — G20 Parkinson's disease: Secondary | ICD-10-CM

## 2017-10-26 DIAGNOSIS — R293 Abnormal posture: Secondary | ICD-10-CM | POA: Diagnosis not present

## 2017-10-26 DIAGNOSIS — R41840 Attention and concentration deficit: Secondary | ICD-10-CM | POA: Diagnosis not present

## 2017-10-26 DIAGNOSIS — G479 Sleep disorder, unspecified: Secondary | ICD-10-CM

## 2017-10-26 DIAGNOSIS — R29818 Other symptoms and signs involving the nervous system: Secondary | ICD-10-CM | POA: Diagnosis not present

## 2017-10-26 DIAGNOSIS — R29898 Other symptoms and signs involving the musculoskeletal system: Secondary | ICD-10-CM | POA: Diagnosis not present

## 2017-10-26 DIAGNOSIS — R471 Dysarthria and anarthria: Secondary | ICD-10-CM

## 2017-10-26 DIAGNOSIS — G4752 REM sleep behavior disorder: Secondary | ICD-10-CM

## 2017-10-26 DIAGNOSIS — R278 Other lack of coordination: Secondary | ICD-10-CM | POA: Diagnosis not present

## 2017-10-26 DIAGNOSIS — R2681 Unsteadiness on feet: Secondary | ICD-10-CM | POA: Diagnosis not present

## 2017-10-26 MED ORDER — CLONAZEPAM 0.25 MG PO TBDP: 0.2500 mg | ORAL_TABLET | Freq: Every day | ORAL | 5 refills | Status: DC

## 2017-10-26 NOTE — Therapy (Signed)
[facility] [address] [place], [name], [address] Phone: [phone]   Fax:  [phone]  Speech Language Pathology Treatment  Patient Details  Name: Ronald Noble MRN: [id number] Date of Birth: 1945-04-07 Referring Provider: Star Age, MD   Encounter Date: 10/26/2017  End of Session - 10/26/17 1508    Visit Number  5    Number of Visits  17    Date for SLP Re-Evaluation  11/26/17    SLP Start Time  1232    SLP Stop Time   1312    SLP Time Calculation (min)  40 min    Activity Tolerance  Patient tolerated treatment well       Past Medical History:  Diagnosis Date  . BPH (benign prostatic hyperplasia)   . Parkinson disease ([place])   . Skin cancer of face    arms and back  . Sleep apnea    mild  . Spider bite    brown recluse/ left ankle/08/13    Past Surgical History:  Procedure Laterality Date  . BURR HOLE W/ STEREOTACTIC INSERTION OF DBS LEADS / INTRAOP MICROELECTRODE RECORDING  04/2014  . COLONOSCOPY    . KNEE SURGERY     exploratory/left  . SKIN CANCER EXCISION  01/2014   face  . TRANSURETHRAL RESECTION OF PROSTATE     2008    There were no vitals filed for this visit.  Subjective Assessment - 10/26/17 1237    Subjective  "She is requesting repetition about the same - others in the community a little less"    Currently in Pain?  No/denies            ADULT SLP TREATMENT - 10/26/17 1240      General Information   Behavior/Cognition  Alert;Cooperative;Pleasant mood      Treatment Provided   Treatment provided  Cognitive-Linquistic      Cognitive-Linquistic Treatment   Treatment focused on  Dysarthria    Skilled Treatment  Pt continues to report difficulty carryover over over-articulation to help reduce rate.  Facilitated generation of  externally to help pt remember to carryover compensations  - see pt instructions. Pt utilized compensations in structured tasks with rare  min A - Loud /a/ to recalibrate volume with average of 91dB rare min A. In simple conversation, volume average 70dB with rare min A. At conversation level over 12 minutes, pt maintained volume with supervision cues, occasional min for over articulation  to reduce rate      Assessment / Recommendations / Plan   Plan  Continue with current plan of care      Progression Toward Goals   Progression toward goals  Progressing toward goals       SLP Education - 10/26/17 1504    Education Details  Practice HW twice a day, consider external aids to help remember to use strategies    Person(s) Educated  Patient    Methods  Explanation;Handout    Comprehension  Verbalized understanding       SLP Short Term Goals - 10/26/17 1507      SLP SHORT TERM GOAL #1   Title  Pt will average low-mid 90sdB with loud /a/ for 3 sessions with rare min A    Time  2    Period  Weeks    Status  On-going      SLP SHORT TERM GOAL #2   Title  Pt will average low-mid 70sdB on structured speech tasks with  rare min A for three sessions    Time  2    Period  Weeks    Status  On-going      SLP SHORT TERM GOAL #3   Title  Pt will maintain average low 70s dB over 8 minute simple conversation with rare min A, for 2 sessions    Time  2    Period  Weeks    Status  On-going      SLP SHORT TERM GOAL #4   Title  pt will maintain speech intelligibility at 95%+ using intelligibility compensations for 8 minutes simple conversation in three sesssions    Time  2    Period  Weeks    Status  On-going      SLP SHORT TERM GOAL #5   Title  pt will demo knowledge of 4 different memory compenstions    Time  2    Period  Weeks    Status  On-going       SLP Long Term Goals - 10/26/17 1507      SLP LONG TERM GOAL #1   Title  Pt will maintain low 70sdB over 10 minute mod complex conversation with rare min A in three therapy sessions    Time  6    Period  Weeks or 17 sessions, for all LTGs    Status  On-going      SLP  LONG TERM GOAL #2   Title  Pt will maintain 95%+ speech intelligibility in noisy environment outside of tx room over 12 minutes with rare min A, in three sessions    Time  6    Period  Weeks    Status  On-going      SLP LONG TERM GOAL #3   Title  pt will demo one memory compensation in 2 sessions     Time  6    Period  Weeks    Status  On-going      SLP LONG TERM GOAL #4   Title  pt will demo anticipatory awareness to independently double check his work in [employer] complex cognitive linguistic tasks in 4 sessions    Time  7    Period  Weeks    Status  On-going      SLP LONG TERM GOAL #5   Status  On-going       Plan - 10/26/17 1504    Clinical Impression Statement  Pt maintained average of 70dB in structured tasks and simple conversation with supervision cues today, however ongoing cues for over articulation to reduce rate of speech.Pt conintues to report his wife and others requesting that he repeat himself regularly. Continue skilled ST to maximize intelligilbity for QOL    Speech Therapy Frequency  2x / week    Treatment/Interventions  SLP instruction and feedback;Oral motor exercises;Cueing hierarchy;Compensatory techniques;Cognitive reorganization;Functional tasks;Patient/family education;Multimodal communcation approach;Internal/external aids    Potential to Achieve Goals  Good       Patient will benefit from skilled therapeutic intervention in order to improve the following deficits and impairments:   Dysarthria and anarthria    Problem List Patient Active Problem List   Diagnosis Date Noted  . Parkinson disease ([place])     Ayodele Sangalang, Annye Rusk MS, CCC-SLP 10/26/2017, 3:09 PM  [facility] [address] [place], [name], [address] Phone: [phone]   Fax:  [phone]   Name: Ronald Noble MRN: [id number] Date of Birth: November 20, 1945

## 2017-10-26 NOTE — Patient Instructions (Addendum)
Your exam is stable. Continue your medications.  Please continue with your speech therapy.

## 2017-10-26 NOTE — Progress Notes (Signed)
Subjective:    Patient ID: Ronald Noble is a 72 y.o. male.  HPI     Interim history:   Mr. Ronald Noble is a 72 year old right-handed gentleman with an underlying medical history of skin cancer including melanoma, enlarged prostate status post TURP who presents for followup consultation of his R sided predominant Parkinson's disease, complicated by RBD and sleep disturbance as well as mild dyskinesias, s/p bilateral DBS placement at [facility]. He is accompanied by his wife today. I last saw him on 04/28/2017, at which time he reported some daytime somnolence. He was supposed to get cataract surgeries and colonoscopy done. He had done well after Botox injections at [facility]. He was on amantadine twice daily and I suggested we reduce it to once daily and consider taking him off altogether. I suggested he take his long-acting Requip 6 mg once daily in the evening, instead of during the day. We kept his Sinemet at 1-1/2 pills 5 times a day.  Today, 10/26/2017: He reports doing okay, tried Gocovri for one month, but cost was prohibitive. Back on the amantadine 100 mg bid. He tried the reduced dose of amantadine but did not do well on the lower dose, went back on regular amantadine after trying the long-acting once a month. No recent falls reported. Continues to take Botox injections for blepharospasm successfully. Take Sinemet 1-1/2 pills 5 times a day, long-acting Requip once daily at 5 PM. They worry about his sister, who also has PD.    The patient's allergies, current medications, family history, past medical history, past social history, past surgical history and problem list were reviewed and updated as appropriate.    Previously (copied from previous notes for reference):   I saw him on 10/29/2016, at which time he reported doing okay, he had no recent falls, his left eye ptosis improved after adjustment of his DBS. He did not end up having any Botox injections to his eyelid. He was on Sinemet  1-1/2 pills 5 times a day, 3 hourly intervals, starting approximately at 8 AM. He was on amantadine twice daily and Requip XL around 1 PM.    I saw him on 04/09/2016, at which time he was doing okay, he had improvement in his blood first specimen on the left in facial dyskinesias. He had been followed at [facility]. He was started on amantadine and was taking it twice daily. He was off of Stalevo completely. He felt that his abdominal cramping and facial dyskinesias improved after he came off of Stalevo altogether. He was in the process of being scheduled for Botox injections for his blepharospasm at [facility]. He was still on once daily long-acting Requip, when he tried to stop it completely he noticed a flareup of his RLS. He denied any recent falls. He was exercising regularly. He did have some constipation but generally under control with some regularity in his bowel movements reported.    I saw him on 07/09/2015, at which time he reported no recent syncope. He had a tilt table test in January 2017 which showed abnormal findings but minimal symptoms reported. He saw Dr. Hartford Poli with vascular surgery for consultation on 05/28/2015. He had a 24-hour blood pressure monitor. He had a follow-up appointment pending with his vascular specialist. He also had an appointment pending with [name] in October 2017. He felt he pulled a muscle in his upper back on the right. He was on Stalevo about 4 times a day, sometimes 5 times a day depending  on his day to day activities. He was on long-acting Requip 6 mg once daily. He was sleeping a little better. He had been advised to start using compression stockings but had not started yet. He had noticed worsening of droopy eyelid. He mailed in December 2017 Re: Blepharospasm and treatment for this. He had been seeing an ophthalmologist.   I saw him on 03/11/2015, at which time he reported a recent fall in September 2016. He had just come back from a road trip and after  being in the car for about 2-1/2 hours he got out of the car and fell to the ground for his wife even realize what happened. He fell on pavement and chipped his tooth. They were with some friends who helped. He had to have a crown placed over his chipped tooth. His DBS settings were adjusted when he had his follow-up at [facility]. His orthostatic blood pressure values were stable. Since his settings were changed he had no other syncopal spell. He did have some medication changes as well. He was no longer on clonazepam. Stalevo was 100 mg strength 4-5 times a day. Requip long-acting was 6 mg once daily. She was on long-acting melatonin 6 mg each night. He was advised to drink 4 bottles of water. He was scheduled for tilt table test as well. His wife was concerned about him climbing on ladders. We talked about gait safety and syncope risk. He was advised not to climb on any ladders or work at heights.    I saw him on 11/07/2014, at which time he reported doing reasonably well. He had nasal congestion after his fall and injury. Of note, unfortunately, he had a syncopal spell on 10/29/2014 and hit his nose as he fell and sustained a nasal fracture. I reviewed the emergency room records. He was referred to ENT. His laceration was repaired with Dermabond. He reported that he stood up and felt lightheaded and then passed out. He fell and hit his face. He had a CT head without contrast as well as maxillofacial CT without contrast on 10/29/2014 which showed: RIGHT and LEFT nasal bone fractures. Small displaced fracture fragment on the LEFT. No skull fracture or intracranial hemorrhage. Unremarkable appearing deep brain stimulator leads. He fell at work. He had taken Levitra at night and 2 days prior he was working hard in the yard. He is notoriously not drinking enough water he admits. He had taken his Stalevo in the morning and was due for his next dose and became lightheaded when he got up quickly from his  desk. He had been sitting for about 45 minutes consistently at his desk. He did have a warning sign and kept walking instead of sitting down again. He had a follow-up appointment at [facility] on 11/02/2014 and his Requip was increased slightly again. He had re-programming of his DBS as well. He had reduced the Requip XL to 3 mg twice daily but this was increased to 4 mg twice daily. His Stalevo was at 4-6 pills daily depending on his work day. He does get sleepy at night around 9:30 and often falls asleep while watching TV. He tried Melatonin long-acting but in combination with clonazepam at night it did not work well and in fact he had insomnia. He tried to reduce his clonazepam to 0.25 mg each night but increased it back to 0.5 mg each night. For his nose fracture he saw Dr. Redmond Baseman and was scheduled for surgery. I did not change  his medications. I asked him to drink more water and reminded him to change positions slowly.   I saw him on 08/07/2014, at which time he reported doing quite well after his DBS placement. His wife felt that he was doing great. He was eating well. He was not drinking enough water. He had trace edema in his distal lower extremities. He had 2 appointments for DBS programming. His Stalevo was reduced from 6 pills a day to 4 pills a day but then he increased it back to 5 pills a day. He was off generic Sinemet. He was on Azilect once daily and Requip long-acting 8 mg twice daily.    He's been back to work. He is currently on Azilect once daily, Requip XL 8 mg twice daily, Stalevo 100 mg strength one pill 5 times a day and he has stopped the carbidopa-levodopa. I reviewed his outside records from Dr. Salomon Fick at [facility] neurosurgery. Patient had bilateral STN DBS electrodes placed on 06/01/2014. He had pulse generators placed approximately 3 weeks after that, 06/13/14.   I saw him on 02/01/2014, at which time he reported difficulty staying asleep. He had  not consistently tried melatonin. He had problems with urinary urgency and was advised to try Kegel exercises by his urologist. His PCP talked to an infectious diseases specialist and it was determined he did not actually have Lyme disease.    He did not show for an appointment on 01/30/2014 - he genuinely forgot!   I saw him on 12/19/2013, at which time he reported that his medication was not working as well. He reported more freezing episodes and more off time. Of note, he had been using a new protein milkshake that he was drinking first thing in the morning. He had lost some weight and in an effort to improve his weight loss he had started this new supplements. His primary care physician had referred him to a nutritionist. We also talked about DBS evaluation and he requested to see another specialist for DBS candidacy. I referred him to [name] at [facility] I also advised him to discontinue using the new protein supplement as I felt it may be interfering with the absorption of his Stalevo. I also suggested a trial of Parcopa half a pill 3 times a day at 11, 2 PM and 7 PM and we kept the Stalevo the same. He was advised to use MiraLAX as needed for constipation.   I saw him on 07/31/13, at which time [name], including swimming, yoga, and weights. He has been on Stalevo 100 mg every 2 1/2 hours starting at 7:30 AM for 6 doses. He stopped clonazepam, as he felt some daytime sluggishness. He has been taking C/L 1/2 tid in between. He had no cognitive issues, but did have some "downtime" midday. I did not change his medications. He had an appointment with [name] at [facility] in June, but there was a mix up with his appointment and he does not have an appointment until next year.   In the interim, he was diagnosed with Lyme d/s in July. He was tested and treated by his PCP has has been referred to an infectious d/s doctor. He brings in his latest  lying test results from 12/05/2013 which I reviewed: It looks like his IgM antibody status is negative but his IgG antibody status has not quite turn positive as far as I understand  the report. That means he is probably in the subacute or early chronic phase if I interpret this correctly. He requested a sooner than scheduled appointment, due to feeling more freezing and more off time. This starts in the late morning hours but can also go into the afternoon after lunch. Cognitively and mood wise he feels stable. He does not sleep well at night. He has not consistently tried melatonin I recommended.   I saw him on 01/30/2013, at which time I felt he was stable. We talked about DBS surgery. I did not make any medication changes. He is aware of the increase risk of melanoma associated with DAs and his dermatologist is aware that he is taking Requip XL.   I first met him on 09/27/2012, which time I did not make any changes to his medication and did not order any new test as he was stable. He saw [name] at [facility] and discussed DBS in the past but was not deemed a cadidate for it at the time as he was doing well. He was told by [name] to stay on schedule with his medication. 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 Sleep has been an issue. He goes to sleep well, but wakes up after 45 minutes to 1 hour. He is seeing a urologist. He exercises regularly.   He previously followed with [name] and was last seen by him on 05/25/2012, at which time [name] felt he was stable, but because of off time at 2 hours he increased his carbidopa-levodopa by adding half of a 10-100 mg strength 3 times a day to his regimen. He recommended decreasing his Stalevo to 125 mg from 100 mg 6 times a day. He has been on Stalevo 125 mg 6 times a day, at 7:30, 10, 12:30, 3 PM, 5:30 PM and 8 PM. He is on clonazepam 0.5 mg at night, Requip long-acting 6 mg twice daily, rasagiline 1 mg once daily,  carbidopa-levodopa 10-100 mg strength half a tablet 3 times a day, 7:30 to 10, then midday and later in the evening.   He was followed by [name] since 2008 with a history of Parkinson's disease. He has a history of increased tone and slowness as well as tremor with R sided predominance. He was initially placed on Requip XL and then rasagiline was added in October 2008. In February 2010 Stalevo was added. He had low vitamin D levels in 2008. ANA, ESR, ceruloplasmin were normal except for slightly low ceruloplasmin of 16. Urine copper test was negative. Methylmalonic acid was normal. Urine for heavy metals is normal as well. MRI brain with and without contrast on 01/07/2007 was normal. He had a sleep study in October 2008 showing PLMs and evidence of RBD. Repeat sleep study in September 2011 showed supine obstructive sleep apnea and RBD. He had a CPAP titration study in late September 2011 which was not very successful. He has been on clonazepam at night. He has not noticed any compulsive behaviors, memory loss, depression, swelling, or orthostatic dizziness. He recently had a skin biopsy showing atypical cells, concerning for melanoma.   He works full-time as an Forensic psychologist, [employer]. He exercises regularly and does yoga.    His Past Medical History Is Significant For: Past Medical History:  Diagnosis Date  . BPH (benign prostatic hyperplasia)   . Parkinson disease ([place])   . Skin cancer of face    arms and  back  . Sleep apnea    mild  . Spider bite    brown recluse/ left ankle/08/13    His Past Surgical History Is Significant For: Past Surgical History:  Procedure Laterality Date  . BURR HOLE W/ STEREOTACTIC INSERTION OF DBS LEADS / INTRAOP MICROELECTRODE RECORDING  04/2014  . COLONOSCOPY    . KNEE SURGERY     exploratory/left  . SKIN CANCER EXCISION  01/2014   face  . TRANSURETHRAL RESECTION OF PROSTATE     2008    His Family History Is Significant For: Family History   Problem Relation Age of Onset  . COPD Mother   . Cancer Father        Bladder  . Colon cancer Neg Hx   . Esophageal cancer Neg Hx   . Stomach cancer Neg Hx     His Social History Is Significant For: Social History   Socioeconomic History  . Marital status: Married    Spouse name: Levada Dy  . Number of children: 4  . Years of education: 62  . Highest education level: Not on file  Occupational History  . Occupation: LAWYER    Employer: [employer]  . Financial resource strain: Not on file  . Food insecurity:    Worry: Not on file    Inability: Not on file  . Transportation needs:    Medical: Not on file    Non-medical: Not on file  Tobacco Use  . Smoking status: Never Smoker  . Smokeless tobacco: Never Used  Substance and Sexual Activity  . Alcohol use: Yes    Alcohol/week: 3.0 oz    Types: 3 Glasses of wine, 2 Cans of beer per week    Comment: occasional  . Drug use: No  . Sexual activity: Not on file  Lifestyle  . Physical activity:    Days per week: Not on file    Minutes per session: Not on file  . Stress: Not on file  Relationships  . Social connections:    Talks on phone: Not on file    Gets together: Not on file    Attends religious service: Not on file    Active member of club or organization: Not on file    Attends meetings of clubs or organizations: Not on file    Relationship status: Not on file  Other Topics Concern  . Not on file  Social History Narrative   Patient is right handed and resides with wife   1-2 cups of coffee a day     His Allergies Are:  No Known Allergies:   His Current Medications Are:  Outpatient Encounter Medications as of 10/26/2017  Medication Sig  . amantadine (SYMMETREL) 100 MG capsule   . carbidopa-levodopa (SINEMET IR) 25-100 MG tablet Take 1.5 tablets by mouth 5 (five) times daily.  . clonazePAM (KLONOPIN) 0.25 MG disintegrating tablet Take 1 tablet (0.25 mg total) by mouth at bedtime.  Marland Kitchen  desmopressin (DDAVP) 0.2 MG tablet Take 0.6 mg by mouth.  . Ropinirole HCl 6 MG TB24 2 (two) times daily.   . [DISCONTINUED] Amantadine HCl ER (GOCOVRI) 137 MG CP24 Take by mouth.  . [DISCONTINUED] Amantadine HCl ER (GOCOVRI) 137 MG CP24 Take by mouth.   Facility-Administered Encounter Medications as of 10/26/2017  Medication  . 0.9 %  sodium chloride infusion  :  Review of Systems:  Out of a complete 14 point review of systems, all are reviewed and negative with  the exception of these symptoms as listed below: Review of Systems  Endocrine:       Patient returning to office today for follow up. Has stopped Amantadine ER due to price. Patient reports no falls.     Objective:  Neurological Exam  Physical Exam Physical Examination:   Vitals:   10/26/17 1521  BP: 138/87  Pulse: 67   General Examination: The patient is a very pleasant 72 y.o. male in no acute distress. He appears well-developed and well-nourished and well groomed.   HEENT:Normocephalic, atraumatic, pupils are equal, round and reactive to light and accommodation. Extraocular tracking shows mild saccadic breakdown without nystagmus noted. There is mild limitation to upgaze, and no significant blepharospasm of the left eye. Neck is moderately rigid.. Oropharynx exam reveals mild mouth dryness. No significant airway crowding is noted. Mallampati is class II. Tongue protrudes centrally and palate elevates symmetrically. There is no drooling. There are no dyskinesias. His stimulator wires are tunneled on the left side.   Chest:is clear to auscultation without wheezing, rhonchi or crackles noted. His generator site is not tender.   Heart:sounds are regular and normal without murmurs, rubs or gallops noted.   Abdomen:is soft, non-tender and non-distended with normal bowel sounds appreciated on auscultation.  Extremities:There is1+edema in the distal lower extremities bilaterally.   Skin: is warm and dry with no  trophic changes noted. Age-related changes are noted on the skin and scars from skin cancer removals.   Musculoskeletal: exam reveals no obvious joint deformities, tenderness, joint swelling or erythema, with the exception of low back pain reported and right shoulder pain.  Neurologically:  Mental status: The patient is awake and alert, paying good attention. He is able to completely provide the history. He is oriented to: person, place, time/date, situation, day of week, month of year and year. His memory, attention, language and knowledge areappropriate.There is no aphasia, agnosia, apraxia or anomia. There is a mild degree of bradyphrenia. Speech is mild to moderately hypophonic with no dysarthria noted. Mood is congruent and affect is normal.   Cranial nerves are as described above under HEENT exam. In addition, shoulder shrug is normal with equal shoulder height noted.  Motor exam: Normal bulk, and strength for age is noted. There are no dyskinesias noted today. No resting tremor.  Tone is mildly rigid with presence of cogwheeling in the right upper extremity. There is overall mild to moderate bradykinesia. There is no drift or rebound.  There in an intermittent myoclonus in the right lower extremity.  Romberg is negative, with the exception of mild sway.  Fine motor skills exam: Fine motor skills are mild to moderately impaired, more pronounced on the R than L, stable. Cerebellar testing shows no dysmetria or intention tremor on finger to nose testing. Heel to shin is unremarkable bilaterally. There is no truncal or gait ataxia.   Sensory exam is intact to light touch throughout.   Gait, station and balance: He stands up from the seated position with no significant difficulty and does not need to push up with His hands. He needs no assistance. No veering to one side is noted. He isnoted to lean to the right slightly and posture is slightly worse than last time. He is more stooped.  He walks withdecreased arm swing bilaterally. Balance is mildly impaired.   Assessment and Plan:   In summary, Jihad Brownlow is a very pleasant 72 year old male with a history of right-sided predominant Parkinson's disease, complicated by RBD, sleep disturbance,  blepharospasm, generalized dyskinesias, motor fluctuations, orthostatic hypotension, syncopal spells, falls, constipation and bladder hyperactivity, and now s/p b/l DBS placement at [facility] on 06/01/14 and generator placement on 06/13/2014. Overall, he has done well, but has had low back pain, arthritis in his right shoulder, sleep disturbance. He did have 2 distinct syncopal spells that led to collapse, falls, and injuries. He sustained Fx of nose when he fell in his office, and then also fell in his driveway and chipped a tooth, needed a crown placed. He hadto have nose surgery. He saw a vascular specialist and had a tilt table test at [facility]. He had a24-hour blood pressure monitor test. He  was started on amantadine for dyskinesias. He had a one month trial of Gocovri, but a Rx was cost prohibitive. He is getting regular Botox injection for blepharospasm. He used to be on Stalevo in the past but was able to stop this in or around early 2018. I suggested he continue with the current regimen of clonazepam namely 0.25 mg at night for his REM behavior disorder and nighttime sleep disruption. He is advised to continue with Sinemet at the current dose and regimen and Requip 6 mg every evening. I suggested a 6 month follow-up, sooner as needed. I answered all their questions today and the patient and his wife were in agreement.  I spent 25 minutes in total face-to-face time with the patient, more than 50% of which was spent in counseling and coordination of care, reviewing test results, reviewing medication and discussing or reviewing the diagnosis of PD, its prognosis and treatment options. Pertinent laboratory and imaging test results that  were available during this visit with the patient were reviewed by me and considered in my medical decision making (see chart for details).

## 2017-10-26 NOTE — Patient Instructions (Signed)
  Try some external reminders to help you remember to use over-enunciation and volume, such as sticky notes in each room, or use loose rubber band of gummy bracelet or switch your watch to the other hand when you are talking with your wife or have public speaking.   Visualize that you are talking to your neighbor over the fence and he is at his grill cooking  If you are really having difficulty (a bad day) consider tapping for each word slowly to reduce your rate.   Practice your sheets twice a day - think of having Mrs. B understand you in the next room - being that loud and distinct

## 2017-10-28 ENCOUNTER — Ambulatory Visit: Payer: Medicare Other | Admitting: Speech Pathology

## 2017-10-28 ENCOUNTER — Encounter: Payer: Medicare Other | Admitting: Occupational Therapy

## 2017-10-28 ENCOUNTER — Ambulatory Visit: Payer: Medicare Other | Admitting: Physical Therapy

## 2017-10-28 DIAGNOSIS — R471 Dysarthria and anarthria: Secondary | ICD-10-CM

## 2017-10-28 DIAGNOSIS — R293 Abnormal posture: Secondary | ICD-10-CM | POA: Diagnosis not present

## 2017-10-28 DIAGNOSIS — R29898 Other symptoms and signs involving the musculoskeletal system: Secondary | ICD-10-CM | POA: Diagnosis not present

## 2017-10-28 DIAGNOSIS — R41840 Attention and concentration deficit: Secondary | ICD-10-CM | POA: Diagnosis not present

## 2017-10-28 DIAGNOSIS — R2681 Unsteadiness on feet: Secondary | ICD-10-CM | POA: Diagnosis not present

## 2017-10-28 DIAGNOSIS — R29818 Other symptoms and signs involving the nervous system: Secondary | ICD-10-CM | POA: Diagnosis not present

## 2017-10-28 DIAGNOSIS — R278 Other lack of coordination: Secondary | ICD-10-CM | POA: Diagnosis not present

## 2017-10-28 NOTE — Patient Instructions (Signed)
  Practice the phrases that you would use in your job, for example, answering the most typical questions  Ex: What you tell clients about custody       Finances       Child support         Define LOUD with big belly breath,   Complaint  Discovery  Judgement  Alienation of affection  Criminal conversation  Settlement Agreement  Interrogetories  Mediation  Condonation  Common Law Marriage  Default  Dissoslution  [place]  Petition  Uncontested Divorce  [facility] [place]  Match  Mixed doubles  Explain how to score tennis  Anything else that is pertinent to you!  Remember Redmond Pulling is over the fence - Think shout and over articulate

## 2017-10-28 NOTE — Therapy (Signed)
[facility] [address] [place], [name], [address] Phone: [phone]   Fax:  [phone]  Speech Language Pathology Treatment  Patient Details  Name: Ronald Noble MRN: [id number] Date of Birth: Mar 31, 1946 Referring Provider: Star Age, MD   Encounter Date: 10/28/2017  End of Session - 10/28/17 1305    Visit Number  6    Number of Visits  17    Date for SLP Re-Evaluation  11/26/17    SLP Start Time  0933    SLP Stop Time   1013    SLP Time Calculation (min)  40 min    Activity Tolerance  Patient tolerated treatment well       Past Medical History:  Diagnosis Date  . BPH (benign prostatic hyperplasia)   . Parkinson disease ([place])   . Skin cancer of face    arms and back  . Sleep apnea    mild  . Spider bite    brown recluse/ left ankle/08/13    Past Surgical History:  Procedure Laterality Date  . BURR HOLE W/ STEREOTACTIC INSERTION OF DBS LEADS / INTRAOP MICROELECTRODE RECORDING  04/2014  . COLONOSCOPY    . KNEE SURGERY     exploratory/left  . SKIN CANCER EXCISION  01/2014   face  . TRANSURETHRAL RESECTION OF PROSTATE     2008    There were no vitals filed for this visit.  Subjective Assessment - 10/28/17 0934    Subjective  "I'm practicing, but not as much as I should - my sisiter was visits from [place]"    Currently in Pain?  No/denies            ADULT SLP TREATMENT - 10/28/17 0935      General Information   Behavior/Cognition  Alert;Cooperative;Pleasant mood      Treatment Provided   Treatment provided  Cognitive-Linquistic      Cognitive-Linquistic Treatment   Treatment focused on  Dysarthria    Skilled Treatment  Pt entered room in conversation at 68dB average mod I. Recalibrated volume with loud /a/ average of 92dB mod I.  Simple structured tasks (defining/describing) with average of 70dB and usual min A for vocal decay. Pt and I generated list of profressional  (law) terms and hobbies terms (tennis and Foster) for pt to practice defining and putting in sentences for motivation to practice. Also instructed pt to practice using compensations to answer his top 5 or 10 questions re: family  law - his typical spiel, for improved carryover of compensations to work.        SLP Education - 10/28/17 1304    Education Details  Practice common phrases, explanations you use at work, see pt instructions    Person(s) Educated  Patient    Methods  Explanation;Handout    Comprehension  Verbalized understanding;Returned demonstration       SLP Short Term Goals - 10/28/17 1305      SLP SHORT TERM GOAL #1   Title  Pt will average low-mid 90sdB with loud /a/ for 3 sessions with rare min A    Time  2    Period  Weeks    Status  On-going      SLP SHORT TERM GOAL #2   Title  Pt will average low-mid 70sdB on structured speech tasks with rare min A for three sessions    Time  2    Period  Weeks    Status  On-going  SLP SHORT TERM GOAL #3   Title  Pt will maintain average low 70s dB over 8 minute simple conversation with rare min A, for 2 sessions    Time  2    Period  Weeks    Status  On-going      SLP SHORT TERM GOAL #4   Title  pt will maintain speech intelligibility at 95%+ using intelligibility compensations for 8 minutes simple conversation in three sesssions    Time  2    Period  Weeks    Status  On-going      SLP SHORT TERM GOAL #5   Title  pt will demo knowledge of 4 different memory compenstions    Time  2    Period  Weeks    Status  On-going       SLP Long Term Goals - 10/28/17 1305      SLP LONG TERM GOAL #1   Title  Pt will maintain low 70sdB over 10 minute mod complex conversation with rare min A in three therapy sessions    Time  6    Period  Weeks or 17 sessions, for all LTGs    Status  On-going      SLP LONG TERM GOAL #2   Title  Pt will maintain 95%+ speech intelligibility in noisy environment outside of tx room  over 12 minutes with rare min A, in three sessions    Time  6    Period  Weeks    Status  On-going      SLP LONG TERM GOAL #3   Title  pt will demo one memory compensation in 2 sessions     Time  6    Period  Weeks    Status  On-going      SLP LONG TERM GOAL #4   Title  pt will demo anticipatory awareness to independently double check his work in [employer] complex cognitive linguistic tasks in 4 sessions    Time  7    Period  Weeks    Status  On-going      SLP LONG TERM GOAL #5   Status  On-going       Plan - 10/28/17 1305    Clinical Impression Statement  Pt maintained average of 70dB in structured tasks and simple conversation with supervision cues today, however ongoing cues for over articulation to reduce rate of speech.Pt conintues to report his wife and others requesting that he repeat himself regularly. Continue skilled ST to maximize intelligilbity for QOL       Patient will benefit from skilled therapeutic intervention in order to improve the following deficits and impairments:   Dysarthria and anarthria    Problem List Patient Active Problem List   Diagnosis Date Noted  . Parkinson disease ([place])     Avanna Sowder, Annye Rusk MS, CCC-SLP 10/28/2017, 1:06 PM  [facility] [address] [place], [name], [address] Phone: [phone]   Fax:  [phone]   Name: Ronald Noble MRN: [id number] Date of Birth: [birth year]

## 2017-11-02 ENCOUNTER — Encounter: Payer: Self-pay | Admitting: Speech Pathology

## 2017-11-02 ENCOUNTER — Ambulatory Visit: Payer: Medicare Other | Admitting: Speech Pathology

## 2017-11-02 DIAGNOSIS — R41841 Cognitive communication deficit: Secondary | ICD-10-CM

## 2017-11-02 DIAGNOSIS — R278 Other lack of coordination: Secondary | ICD-10-CM | POA: Diagnosis not present

## 2017-11-02 DIAGNOSIS — R471 Dysarthria and anarthria: Secondary | ICD-10-CM

## 2017-11-02 DIAGNOSIS — R29818 Other symptoms and signs involving the nervous system: Secondary | ICD-10-CM | POA: Diagnosis not present

## 2017-11-02 DIAGNOSIS — R29898 Other symptoms and signs involving the musculoskeletal system: Secondary | ICD-10-CM | POA: Diagnosis not present

## 2017-11-02 DIAGNOSIS — R2681 Unsteadiness on feet: Secondary | ICD-10-CM | POA: Diagnosis not present

## 2017-11-02 DIAGNOSIS — R293 Abnormal posture: Secondary | ICD-10-CM | POA: Diagnosis not present

## 2017-11-02 DIAGNOSIS — R41840 Attention and concentration deficit: Secondary | ICD-10-CM | POA: Diagnosis not present

## 2017-11-02 NOTE — Therapy (Signed)
[facility] [address] [place], [name], [address] Phone: [phone]   Fax:  [phone]  Speech Language Pathology Treatment  Patient Details  Name: Ronald Noble MRN: [id number] Date of Birth: [birth year] Referring Provider: Star Age, MD   Encounter Date: 11/02/2017  End of Session - 11/02/17 1117    Visit Number  7    Number of Visits  17    Date for SLP Re-Evaluation  11/26/17    SLP Start Time  0932    SLP Stop Time   1013    SLP Time Calculation (min)  41 min    Activity Tolerance  Patient tolerated treatment well       Past Medical History:  Diagnosis Date  . BPH (benign prostatic hyperplasia)   . Parkinson disease ([place])   . Skin cancer of face    arms and back  . Sleep apnea    mild  . Spider bite    brown recluse/ left ankle/08/13    Past Surgical History:  Procedure Laterality Date  . BURR HOLE W/ STEREOTACTIC INSERTION OF DBS LEADS / INTRAOP MICROELECTRODE RECORDING  04/2014  . COLONOSCOPY    . KNEE SURGERY     exploratory/left  . SKIN CANCER EXCISION  01/2014   face  . TRANSURETHRAL RESECTION OF PROSTATE     2008    There were no vitals filed for this visit.  Subjective Assessment - 11/02/17 0937    Subjective  "We had a big event - a 72 year old birthday party - my wife would occasionally give me the signal"    Currently in Pain?  No/denies            ADULT SLP TREATMENT - 11/02/17 0939      General Information   Behavior/Cognition  Alert;Cooperative;Pleasant mood      Treatment Provided   Treatment provided  Cognitive-Linquistic      Cognitive-Linquistic Treatment   Treatment focused on  Dysarthria    Skilled Treatment  Pt with reduced facial dyskinesias today and enters with volume average of 72dB with supervision cues.  Pt attributes improved speech to improved sleep. Pt reports utilizing speech to text software to practice volume and rate, rather than  legal terms/ explanations. Out side of ST, walking around building, pt maintained volume and appropriate rate of speech to be intellgilble in conversation over 15 minutes, with 1 verbal cues to notice the increased traffic noise and talk over it.  Pr verbalizes strategies of using association to recall names.       Assessment / Recommendations / Plan   Plan  Continue with current plan of care      Progression Toward Goals   Progression toward goals  Progressing toward goals         SLP Short Term Goals - 11/02/17 1015      SLP SHORT TERM GOAL #1   Title  Pt will average low-mid 90sdB with loud /a/ for 3 sessions with rare min A    Time  1    Period  Weeks    Status  Achieved      SLP SHORT TERM GOAL #2   Title  Pt will average low-mid 70sdB on structured speech tasks with rare min A for three sessions    Time  1    Period  Weeks    Status  Achieved      SLP SHORT TERM GOAL #3   Title  Pt will maintain average low 70s dB over 8 minute simple conversation with rare min A, for 2 sessions    Time  1    Period  Weeks    Status  Achieved      SLP SHORT TERM GOAL #4   Title  pt will maintain speech intelligibility at 95%+ using intelligibility compensations for 8 minutes simple conversation in three sesssions    Time  1    Period  Weeks      SLP SHORT TERM GOAL #5   Title  pt will demo knowledge of 4 different memory compenstions    Time  1    Period  Weeks    Status  On-going       SLP Long Term Goals - 11/02/17 1116      SLP LONG TERM GOAL #1   Title  Pt will maintain low 70sdB over 10 minute mod complex conversation with rare min A in three therapy sessions    Baseline  11/02/17;     Time  5    Period  Weeks or 17 sessions, for all LTGs    Status  On-going      SLP LONG TERM GOAL #2   Title  Pt will maintain 95%+ speech intelligibility in noisy environment outside of tx room over 12 minutes with rare min A, in three sessions    Baseline  11/02/17;     Time  5     Period  Weeks    Status  On-going      SLP LONG TERM GOAL #3   Title  pt will demo one memory compensation in 2 sessions     Time  5    Period  Weeks    Status  On-going      SLP LONG TERM GOAL #4   Title  pt will demo anticipatory awareness to independently double check his work in [employer] complex cognitive linguistic tasks in 4 sessions    Time  5    Period  Weeks    Status  On-going      SLP LONG TERM GOAL #5   Status  On-going       Plan - 11/02/17 1014    Clinical Impression Statement  Pt maintained average of 70dB with appriorate rate today in and out of ST room, walking in noisy environment with rare min A. Pt maintained intellgilble speech in conversations in and out of office. Continue skilled ST to maximize carryover of intelligible speech across environments.     Speech Therapy Frequency  2x / week    Treatment/Interventions  SLP instruction and feedback;Oral motor exercises;Cueing hierarchy;Compensatory techniques;Cognitive reorganization;Functional tasks;Patient/family education;Multimodal communcation approach;Internal/external aids    Potential to Achieve Goals  Good       Patient will benefit from skilled therapeutic intervention in order to improve the following deficits and impairments:   Dysarthria and anarthria  Cognitive communication deficit    Problem List Patient Active Problem List   Diagnosis Date Noted  . Parkinson disease ([place])     Anarie Kalish, Annye Rusk MS, CCC-SLP 11/02/2017, 11:18 AM  [facility] [address] [place], [name], [address] Phone: [phone]   Fax:  [phone]   Name: Mayo Faulk MRN: [id number] Date of Birth: 1945/07/07

## 2017-11-04 ENCOUNTER — Encounter: Payer: Self-pay | Admitting: Speech Pathology

## 2017-11-04 ENCOUNTER — Ambulatory Visit: Payer: Medicare Other | Attending: Internal Medicine | Admitting: Speech Pathology

## 2017-11-04 DIAGNOSIS — R41841 Cognitive communication deficit: Secondary | ICD-10-CM | POA: Insufficient documentation

## 2017-11-04 DIAGNOSIS — R471 Dysarthria and anarthria: Secondary | ICD-10-CM | POA: Diagnosis not present

## 2017-11-04 NOTE — Therapy (Signed)
[facility] [address] [place], [name], [address] Phone: [phone]   Fax:  [phone]  Speech Language Pathology Treatment  Patient Details  Name: Ronald Noble MRN: [id number] Date of Birth: November 07, 1945 Referring Provider: Star Age, MD   Encounter Date: 11/04/2017  End of Session - 11/04/17 1037    Visit Number  8    Number of Visits  17    Date for SLP Re-Evaluation  11/26/17    SLP Start Time  0933    SLP Stop Time   1012    SLP Time Calculation (min)  39 min    Activity Tolerance  Patient tolerated treatment well       Past Medical History:  Diagnosis Date  . BPH (benign prostatic hyperplasia)   . Parkinson disease ([place])   . Skin cancer of face    arms and back  . Sleep apnea    mild  . Spider bite    brown recluse/ left ankle/08/13    Past Surgical History:  Procedure Laterality Date  . BURR HOLE W/ STEREOTACTIC INSERTION OF DBS LEADS / INTRAOP MICROELECTRODE RECORDING  04/2014  . COLONOSCOPY    . KNEE SURGERY     exploratory/left  . SKIN CANCER EXCISION  01/2014   face  . TRANSURETHRAL RESECTION OF PROSTATE     2008    There were no vitals filed for this visit.  Subjective Assessment - 11/04/17 0939    Subjective  "I think it's better - she sees me at the end of the day"    Currently in Pain?  No/denies            ADULT SLP TREATMENT - 11/04/17 0941      General Information   Behavior/Cognition  Alert;Cooperative;Pleasant mood      Treatment Provided   Treatment provided  Cognitive-Linquistic      Cognitive-Linquistic Treatment   Treatment focused on  Dysarthria    Skilled Treatment  Pt enters speech volume average 70dB. Loud /a/ average 88dB with min A.  In structured speech tasks explaining law and tennis terms/rules with average of 70dB and rare min. Simple conversation over 10 minutes with average 70dB with range of 74 to 67dB - rare min visual cue 1x as  volume decreased in extended conversation.       Assessment / Recommendations / Plan   Plan  Continue with current plan of care      Progression Toward Goals   Progression toward goals  Progressing toward goals       SLP Education - 11/04/17 1033    Education Details  Continue daily HEP, listen for volume decline - when you are rididng to the mountains, practice speaking loudly over the road noise and being intelligible when spouse can't read your lips or face    Person(s) Educated  Patient    Methods  Explanation;Verbal cues    Comprehension  Verbalized understanding       SLP Short Term Goals - 11/04/17 1036      SLP SHORT TERM GOAL #1   Title  Pt will average low-mid 90sdB with loud /a/ for 3 sessions with rare min A    Time  1    Period  Weeks    Status  Achieved      SLP SHORT TERM GOAL #2   Title  Pt will average low-mid 70sdB on structured speech tasks with rare min A for three sessions  Time  1    Period  Weeks    Status  Achieved      SLP SHORT TERM GOAL #3   Title  Pt will maintain average low 70s dB over 8 minute simple conversation with rare min A, for 2 sessions    Time  1    Period  Weeks    Status  Achieved      SLP SHORT TERM GOAL #4   Title  pt will maintain speech intelligibility at 95%+ using intelligibility compensations for 8 minutes simple conversation in three sesssions    Time  1    Period  Weeks      SLP SHORT TERM GOAL #5   Title  pt will demo knowledge of 4 different memory compenstions    Time  1    Period  Weeks    Status  On-going       SLP Long Term Goals - 11/04/17 1036      SLP LONG TERM GOAL #1   Title  Pt will maintain low 70sdB over 10 minute mod complex conversation with rare min A in three therapy sessions    Baseline  11/02/17; 11/04/17    Time  5    Period  Weeks or 17 sessions, for all LTGs    Status  On-going      SLP LONG TERM GOAL #2   Title  Pt will maintain 95%+ speech intelligibility in noisy environment  outside of tx room over 12 minutes with rare min A, in three sessions    Baseline  11/02/17;     Time  5    Period  Weeks    Status  On-going      SLP LONG TERM GOAL #3   Title  pt will demo one memory compensation in 2 sessions     Baseline  11/03/17;     Time  5    Period  Weeks    Status  On-going      SLP LONG TERM GOAL #4   Title  pt will demo anticipatory awareness to independently double check his work in mod complex cognitive linguistic tasks in 4 sessions    Time  5    Period  Weeks    Status  On-going      SLP LONG TERM GOAL #5   Status  On-going       Plan - 11/04/17 1034    Clinical Impression Statement  Pt continues to maintain average of 70dB in complex structured tasks and conversation with supervision to rare min visual cues. He verbalizes strategies to improve volume with mod I. Pt reports improved awareness of volume decline and ability to self correct when he is aware of his reduced volume. Continue skilled ST to maximize intelligibility across situations/environments    Speech Therapy Frequency  2x / week    Treatment/Interventions  SLP instruction and feedback;Oral motor exercises;Cueing hierarchy;Compensatory techniques;Cognitive reorganization;Functional tasks;Patient/family education;Multimodal communcation approach;Internal/external aids    Potential to Achieve Goals  Good       Patient will benefit from skilled therapeutic intervention in order to improve the following deficits and impairments:   Dysarthria and anarthria    Problem List Patient Active Problem List   Diagnosis Date Noted  . Parkinson disease ([place])     [place], Annye Rusk MS, CCC-SLP 11/04/2017, 10:37 AM  [facility] [address] [place], [name], [address] Phone: [phone]   Fax:  [phone]  Name: Ronald Noble MRN: [id number] Date of Birth: September 25, 1945

## 2017-11-09 ENCOUNTER — Encounter: Payer: Self-pay | Admitting: Speech Pathology

## 2017-11-09 ENCOUNTER — Ambulatory Visit: Payer: Medicare Other | Admitting: Speech Pathology

## 2017-11-09 DIAGNOSIS — R471 Dysarthria and anarthria: Secondary | ICD-10-CM | POA: Diagnosis not present

## 2017-11-09 DIAGNOSIS — R41841 Cognitive communication deficit: Secondary | ICD-10-CM | POA: Diagnosis not present

## 2017-11-09 NOTE — Therapy (Signed)
[facility] [address] [place], [name], [address] Phone: [phone]   Fax:  [phone]  Speech Language Pathology Treatment  Patient Details  Name: Ronald Noble MRN: [id number] Date of Birth: 22-Oct-1945 Referring Provider: Star Age, MD   Encounter Date: 11/09/2017  End of Session - 11/09/17 0925    Visit Number  9    Number of Visits  17    Date for SLP Re-Evaluation  11/26/17    SLP Start Time  0842    SLP Stop Time   0925    SLP Time Calculation (min)  43 min    Activity Tolerance  Patient tolerated treatment well       Past Medical History:  Diagnosis Date  . BPH (benign prostatic hyperplasia)   . Parkinson disease ([place])   . Skin cancer of face    arms and back  . Sleep apnea    mild  . Spider bite    brown recluse/ left ankle/08/13    Past Surgical History:  Procedure Laterality Date  . BURR HOLE W/ STEREOTACTIC INSERTION OF DBS LEADS / INTRAOP MICROELECTRODE RECORDING  04/2014  . COLONOSCOPY    . KNEE SURGERY     exploratory/left  . SKIN CANCER EXCISION  01/2014   face  . TRANSURETHRAL RESECTION OF PROSTATE     2008    There were no vitals filed for this visit.  Subjective Assessment - 11/09/17 0846    Subjective  "My wife is my worst critic - I'm not looking at her when I talk because we are moving around"    Currently in Pain?  No/denies            ADULT SLP TREATMENT - 11/09/17 0847      General Information   Behavior/Cognition  Alert;Cooperative;Pleasant mood      Treatment Provided   Treatment provided  Cognitive-Linquistic      Cognitive-Linquistic Treatment   Treatment focused on  Dysarthria    Skilled Treatment  Pt eneters ST room with average of 69dB (67 to 71dB) - Pt reports his wife continue to cue pt regularly for volume. Structured speech task including readingand spontaneous speech average of 73dB (70 to 76dB). On walk outside of therapy in noisy  environment, pt intelligible over 15 minute conversation. As pt reporting difficiulty carrying over volume, continue to encourage external aids to carryover volume.       Assessment / Recommendations / Plan   Plan  Continue with current plan of care      Progression Toward Goals   Progression toward goals  Progressing toward goals         SLP Short Term Goals - 11/09/17 4196      SLP SHORT TERM GOAL #1   Title  Pt will average low-mid 90sdB with loud /a/ for 3 sessions with rare min A    Time  1    Period  Weeks    Status  Achieved      SLP SHORT TERM GOAL #2   Title  Pt will average low-mid 70sdB on structured speech tasks with rare min A for three sessions    Time  1    Period  Weeks    Status  Achieved      SLP SHORT TERM GOAL #3   Title  Pt will maintain average low 70s dB over 8 minute simple conversation with rare min A, for 2 sessions    Time  1    Period  Weeks    Status  Achieved      SLP SHORT TERM GOAL #4   Title  pt will maintain speech intelligibility at 95%+ using intelligibility compensations for 8 minutes simple conversation in three sesssions    Time  1    Period  Weeks      SLP SHORT TERM GOAL #5   Title  pt will demo knowledge of 4 different memory compenstions    Time  1    Period  Weeks    Status  On-going       SLP Long Term Goals - 11/09/17 4034      SLP LONG TERM GOAL #1   Title  Pt will maintain low 70sdB over 10 minute mod complex conversation with rare min A in three therapy sessions    Baseline  11/02/17; 11/04/17; 11/09/17    Time  5    Period  Weeks or 17 sessions, for all LTGs    Status  On-going      SLP LONG TERM GOAL #2   Title  Pt will maintain 95%+ speech intelligibility in noisy environment outside of tx room over 12 minutes with rare min A, in three sessions    Baseline  11/02/17; 11/09/17    Time  4    Period  Weeks    Status  On-going      SLP LONG TERM GOAL #3   Title  pt will demo one memory compensation in 2 sessions      Baseline  11/03/17;     Time  4    Period  Weeks    Status  On-going      SLP LONG TERM GOAL #4   Title  pt will demo anticipatory awareness to independently double check his work in mod complex cognitive linguistic tasks in 4 sessions    Baseline  11/09/17    Time  4    Period  Weeks    Status  On-going      SLP LONG TERM GOAL #5   Status  On-going       Plan - 11/09/17 0919    Clinical Impression Statement  Pt with volume and intellgibilty WNL in ST, however he continues to report difficulty carrying over volume at home. I educated pt re: use of external reminders for volume. Pt to monitor amount of cueing by sposue and reduce cueing amount to 5x a day, as pt reports she cues him 10 times a day when they are together all day. Continue skilled ST to maximize carryover of intellgilbity across situations/environments.     Speech Therapy Frequency  2x / week    Treatment/Interventions  SLP instruction and feedback;Oral motor exercises;Cueing hierarchy;Compensatory techniques;Cognitive reorganization;Functional tasks;Patient/family education;Multimodal communcation approach;Internal/external aids    Potential to Achieve Goals  Good       Patient will benefit from skilled therapeutic intervention in order to improve the following deficits and impairments:   Dysarthria and anarthria    Problem List Patient Active Problem List   Diagnosis Date Noted  . Parkinson disease ([place])     Sesilia Poucher, Annye Rusk MS, CCC-SLP 11/09/2017, 9:25 AM  [facility] [address] [place], [name], [address] Phone: [phone]   Fax:  [phone]   Name: Ronald Noble MRN: [id number] Date of Birth: 07/26/1945

## 2017-11-11 ENCOUNTER — Ambulatory Visit: Payer: Medicare Other | Admitting: Speech Pathology

## 2017-11-11 ENCOUNTER — Encounter: Payer: Self-pay | Admitting: Speech Pathology

## 2017-11-11 DIAGNOSIS — R471 Dysarthria and anarthria: Secondary | ICD-10-CM | POA: Diagnosis not present

## 2017-11-11 DIAGNOSIS — R41841 Cognitive communication deficit: Secondary | ICD-10-CM | POA: Diagnosis not present

## 2017-11-11 NOTE — Therapy (Signed)
[facility] [address] [place], [name], [address] Phone: [phone]   Fax:  [phone]  Speech Language Pathology Treatment  Patient Details  Name: Ronald Noble MRN: [id number] Date of Birth: March 23, 1946 Referring Provider: Star Age, MD   Encounter Date: 11/11/2017  End of Session - 11/11/17 0928    Visit Number  10    Number of Visits  17    Date for SLP Re-Evaluation  11/26/17    SLP Start Time  0845    SLP Stop Time   0926    SLP Time Calculation (min)  41 min    Activity Tolerance  Patient tolerated treatment well       Past Medical History:  Diagnosis Date  . BPH (benign prostatic hyperplasia)   . Parkinson disease ([place])   . Skin cancer of face    arms and back  . Sleep apnea    mild  . Spider bite    brown recluse/ left ankle/08/13    Past Surgical History:  Procedure Laterality Date  . BURR HOLE W/ STEREOTACTIC INSERTION OF DBS LEADS / INTRAOP MICROELECTRODE RECORDING  04/2014  . COLONOSCOPY    . KNEE SURGERY     exploratory/left  . SKIN CANCER EXCISION  01/2014   face  . TRANSURETHRAL RESECTION OF PROSTATE     2008    There were no vitals filed for this visit.  Subjective Assessment - 11/11/17 0850    Subjective  "I think in the mornings when I first get up, I'm not completely awake - it seems to be more regular"    Currently in Pain?  No/denies            ADULT SLP TREATMENT - 11/11/17 0851      General Information   Behavior/Cognition  Alert;Cooperative;Pleasant mood      Treatment Provided   Treatment provided  Cognitive-Linquistic      Cognitive-Linquistic Treatment   Treatment focused on  Dysarthria    Skilled Treatment  Loud /a/ to recalibrate volume average of 95dB with mod I.  Generated ideas with pt to use external aids in his office and conference room for carryover volume. Simple conversation averaged 71dB with rare min A for volume decay. (68 to  73dB) Structured speech tasks sentence level average 73dB with supervision cues.  Pt utilizing electronic calendar which syncs with his phone and provides an audible reminder 15 mintues prior to his work appointments for memory strategies at work with success.       Assessment / Recommendations / Plan   Plan  Continue with current plan of care      Progression Toward Goals   Progression toward goals  Progressing toward goals       SLP Education - 11/11/17 0919    Education Details  external aids to carryover volume    Person(s) Educated  Patient    Methods  Explanation;Handout    Comprehension  Verbalized understanding       SLP Short Term Goals - 11/11/17 0927      SLP SHORT TERM GOAL #1   Title  Pt will average low-mid 90sdB with loud /a/ for 3 sessions with rare min A    Time  1    Period  Weeks    Status  Achieved      SLP SHORT TERM GOAL #2   Title  Pt will average low-mid 70sdB on structured speech tasks with rare min A for  three sessions    Time  1    Period  Weeks    Status  Achieved      SLP SHORT TERM GOAL #3   Title  Pt will maintain average low 70s dB over 8 minute simple conversation with rare min A, for 2 sessions    Time  1    Period  Weeks    Status  Achieved      SLP SHORT TERM GOAL #4   Title  pt will maintain speech intelligibility at 95%+ using intelligibility compensations for 8 minutes simple conversation in three sesssions    Time  1    Period  Weeks      SLP SHORT TERM GOAL #5   Title  pt will demo knowledge of 4 different memory compenstions    Time  1    Period  Weeks    Status  On-going       SLP Long Term Goals - 11/11/17 0927      SLP LONG TERM GOAL #1   Title  Pt will maintain low 70sdB over 10 minute mod complex conversation with rare min A in three therapy sessions    Baseline  11/02/17; 11/04/17; 11/09/17    Time  5    Period  Weeks    Status  On-going      SLP LONG TERM GOAL #2   Title  Pt will maintain 95%+ speech  intelligibility in noisy environment outside of tx room over 12 minutes with rare min A, in three sessions    Baseline  11/02/17; 11/09/17    Time  4    Period  Weeks    Status  On-going      SLP LONG TERM GOAL #3   Title  pt will demo one memory compensation in 2 sessions     Baseline  11/03/17; 11/11/17    Time  4    Period  Weeks    Status  Achieved      SLP LONG TERM GOAL #4   Title  pt will demo anticipatory awareness to independently double check his work in mod complex cognitive linguistic tasks in 4 sessions    Baseline  11/09/17    Time  4    Period  Weeks    Status  On-going      SLP LONG TERM GOAL #5   Status  On-going       Plan - 11/11/17 0927    Clinical Impression Statement  Pt with volume and intellgibilty WNL in ST, however he continues to report difficulty carrying over volume at home. I educated pt re: use of external reminders for volume. Pt to monitor amount of cueing by sposue and reduce cueing amount to 5x a day, as pt reports she cues him 10 times a day when they are together all day. Continue skilled ST to maximize carryover of intellgilbity across situations/environments.     Speech Therapy Frequency  2x / week    Treatment/Interventions  SLP instruction and feedback;Oral motor exercises;Cueing hierarchy;Compensatory techniques;Cognitive reorganization;Functional tasks;Patient/family education;Multimodal communcation approach;Internal/external aids    Potential to Achieve Goals  Good       Patient will benefit from skilled therapeutic intervention in order to improve the following deficits and impairments:   Dysarthria and anarthria    Problem List Patient Active Problem List   Diagnosis Date Noted  . Parkinson disease ([place])     Loura Pitt, Annye Rusk MS CCC-SLP 11/11/2017, 9:29 AM  [facility] [address] [place], [name], [address] Phone: [phone]   Fax:  [phone]   Name: Ronald Noble MRN: [id number] Date of Birth: April 02, 1946

## 2017-11-11 NOTE — Patient Instructions (Signed)
  Continue to monitor how many times Ronald Noble asks you to request repeat  Use sticky notes that say "Loud" in your office walls to help you remember to project over the phone  Use a visual reminder in the conference room (pen cup?) for volume  Do read to the grands with a loud, expressive voice for practice  When you talk on the phone, assume the other person has some background noise going on and you need to project for them

## 2017-11-16 ENCOUNTER — Ambulatory Visit: Payer: Medicare Other | Admitting: Speech Pathology

## 2017-11-16 ENCOUNTER — Encounter: Payer: Self-pay | Admitting: Speech Pathology

## 2017-11-16 DIAGNOSIS — R471 Dysarthria and anarthria: Secondary | ICD-10-CM

## 2017-11-16 DIAGNOSIS — R41841 Cognitive communication deficit: Secondary | ICD-10-CM | POA: Diagnosis not present

## 2017-11-16 NOTE — Therapy (Signed)
[facility] [address] [place], [name], [address] Phone: [phone]   Fax:  [phone]  Speech Language Pathology Treatment  Patient Details  Name: Ronald Noble MRN: [id number] Date of Birth: Feb 20, 1946 Referring Provider: Star Age, MD   Encounter Date: 11/16/2017  End of Session - 11/16/17 0932    Visit Number  11    Number of Visits  17    Date for SLP Re-Evaluation  12/03/17    Authorization Type  I extended date for re-eval, as pt taking a week off per his request    SLP Start Time  0846    SLP Stop Time   0927    SLP Time Calculation (min)  41 min    Activity Tolerance  Patient tolerated treatment well       Past Medical History:  Diagnosis Date  . BPH (benign prostatic hyperplasia)   . Parkinson disease ([place])   . Skin cancer of face    arms and back  . Sleep apnea    mild  . Spider bite    brown recluse/ left ankle/08/13    Past Surgical History:  Procedure Laterality Date  . BURR HOLE W/ STEREOTACTIC INSERTION OF DBS LEADS / INTRAOP MICROELECTRODE RECORDING  04/2014  . COLONOSCOPY    . KNEE SURGERY     exploratory/left  . SKIN CANCER EXCISION  01/2014   face  . TRANSURETHRAL RESECTION OF PROSTATE     2008    There were no vitals filed for this visit.  Subjective Assessment - 11/16/17 0852    Subjective  "I have been reading to the grandson, but I think I chose a book that is above his vocabulary"    Currently in Pain?  No/denies            ADULT SLP TREATMENT - 11/16/17 0853      General Information   Behavior/Cognition  Alert;Cooperative;Pleasant mood      Treatment Provided   Treatment provided  Cognitive-Linquistic      Cognitive-Linquistic Treatment   Treatment focused on  Dysarthria    Skilled Treatment  Pt enters ST room WNL volume - Loud /a/ to recalibrate volume averate of 89dB with mod I.  Pt maintained >95% intelligiblity walking outside in traffic  and lawn care noise with supervision cues. Pt. continues to report requiring cues for volume at home with spouse.       Assessment / Recommendations / Plan   Plan  Continue with current plan of care      Progression Toward Goals   Progression toward goals  Progressing toward goals       SLP Education - 11/16/17 0926    Education Details  external aids for volume carryover in office and home    Person(s) Educated  Patient    Methods  Explanation;Handout    Comprehension  Verbalized understanding       SLP Short Term Goals - 11/16/17 0931      SLP SHORT TERM GOAL #1   Title  Pt will average low-mid 90sdB with loud /a/ for 3 sessions with rare min A    Time  1    Period  Weeks    Status  Achieved      SLP SHORT TERM GOAL #2   Title  Pt will average low-mid 70sdB on structured speech tasks with rare min A for three sessions    Time  1    Period  Weeks  Status  Achieved      SLP SHORT TERM GOAL #3   Title  Pt will maintain average low 70s dB over 8 minute simple conversation with rare min A, for 2 sessions    Time  1    Period  Weeks    Status  Achieved      SLP SHORT TERM GOAL #4   Title  pt will maintain speech intelligibility at 95%+ using intelligibility compensations for 8 minutes simple conversation in three sesssions    Time  1    Period  Weeks      SLP SHORT TERM GOAL #5   Title  pt will demo knowledge of 4 different memory compenstions    Time  1    Period  Weeks    Status  On-going       SLP Long Term Goals - 11/16/17 0931      SLP LONG TERM GOAL #1   Title  Pt will maintain low 70sdB over 10 minute mod complex conversation with rare min A in three therapy sessions    Baseline  11/02/17; 11/04/17; 11/09/17; 11/16/17    Time  4    Period  Weeks   or 17 sessions, for all LTGs   Status  Achieved      SLP LONG TERM GOAL #2   Title  Pt will maintain 95%+ speech intelligibility in noisy environment outside of tx room over 12 minutes with rare min A, in three  sessions    Baseline  11/02/17; 11/09/17; 11/16/17    Time  3    Period  Weeks    Status  On-going      SLP LONG TERM GOAL #3   Title  pt will demo one memory compensation in 2 sessions     Baseline  11/03/17; 11/11/17    Time  3    Period  Weeks    Status  Achieved      SLP LONG TERM GOAL #4   Title  pt will demo anticipatory awareness to independently double check his work in mod complex cognitive linguistic tasks in 4 sessions    Baseline  11/09/17 - 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 He reports reduced carryover at home, requiring cues from spouse for volume. Ronald Noble environmental strategies such as close proximity to partner, reduces extraneous noises, speak face to face with mod I.  He reports success managing his work with minimal errors due to cognition. He is compensating for mildly reduced cognition with mod I. Pt is meeting ST goals, so we decided to take a week off, for pt to focus on carryover, and return 8/29. If pt with satisfactory success at home, will d/c at that time.    Speech Therapy Frequency  2x / week    Treatment/Interventions  SLP instruction and feedback;Oral motor exercises;Cueing hierarchy;Compensatory techniques;Cognitive reorganization;Functional tasks;Patient/family education;Multimodal communcation approach;Internal/external aids    Potential to Achieve Goals  Good       Patient will benefit from skilled therapeutic intervention in order to improve the following deficits and impairments:  Dysarthria and anarthria    Problem List Patient Active Problem List   Diagnosis Date Noted  . Parkinson disease ([place])     Izaac Reisig, Annye Rusk MS,  CCC-SLP 11/16/2017, 9:33 AM  [facility] [address] [place], [name], [address] Phone: [phone]   Fax:  [phone]   Name: Ronald Noble MRN: [id number] Date of Birth: 06/09/1945

## 2017-11-18 ENCOUNTER — Encounter: Payer: Medicare Other | Admitting: Speech Pathology

## 2017-12-02 ENCOUNTER — Ambulatory Visit: Payer: Medicare Other | Admitting: Speech Pathology

## 2017-12-02 ENCOUNTER — Encounter: Payer: Self-pay | Admitting: Speech Pathology

## 2017-12-02 DIAGNOSIS — R471 Dysarthria and anarthria: Secondary | ICD-10-CM

## 2017-12-02 DIAGNOSIS — R41841 Cognitive communication deficit: Secondary | ICD-10-CM

## 2017-12-02 NOTE — Patient Instructions (Signed)
     Schedule in Loud "AH" 5x twice a day 5 days a week - put a note in your car to remind you  Try to schedule in your OT exercises more regularly  Good Job!!! Keep up the good volume!  Read through your therapy notes occasionally to help you recall your strategies for OT, PT, ST

## 2017-12-02 NOTE — Therapy (Signed)
[facility] [address] [place], [name], [address] Phone: [phone]   Fax:  [phone]  Speech Language Pathology Treatment  Patient Details  Name: Ronald Noble MRN: [id number] Date of Birth: 03/06/1946 Referring Provider: Star Age, MD   Encounter Date: 12/02/2017  End of Session - 12/02/17 1012    Visit Number  12    Number of Visits  17    Date for SLP Re-Evaluation  12/03/17    SLP Start Time  66    SLP Stop Time   1012    SLP Time Calculation (min)  27 min    Activity Tolerance  Patient tolerated treatment well       Past Medical History:  Diagnosis Date  . BPH (benign prostatic hyperplasia)   . Parkinson disease ([place])   . Skin cancer of face    arms and back  . Sleep apnea    mild  . Spider bite    brown recluse/ left ankle/08/13    Past Surgical History:  Procedure Laterality Date  . BURR HOLE W/ STEREOTACTIC INSERTION OF DBS LEADS / INTRAOP MICROELECTRODE RECORDING  04/2014  . COLONOSCOPY    . KNEE SURGERY     exploratory/left  . SKIN CANCER EXCISION  01/2014   face  . TRANSURETHRAL RESECTION OF PROSTATE     2008    There were no vitals filed for this visit.  Subjective Assessment - 12/02/17 0946    Subjective  "My wife is understanding me better" - Pt arrives 10 minutes late    Currently in Pain?  No/denies            ADULT SLP TREATMENT - 12/02/17 0948      General Information   Behavior/Cognition  Alert;Cooperative;Pleasant mood      Treatment Provided   Treatment provided  Cognitive-Linquistic      Cognitive-Linquistic Treatment   Treatment focused on  Dysarthria    Skilled Treatment  Pt reports successful carryover of volume and improved volume at home. He reports he spoke outdoors at a funeral yesterday and was audible across a tent (per his spouse's feedback). In 20 minute conversation pt averaged 72dB with mod I.  Instructed to continue loud /a/ twice  a day 5/7 days. Educated to schedule his OT/ST exercises  so he completes them regularly       Assessment / Recommendations / [place] with current plan of care;Discharge SLP treatment due to (comment)      Progression Toward Goals   Progression toward goals  Goals met, education completed, patient discharged from [facility]  Visits from Start of Care: 12  Current functional level related to goals / functional outcomes: See goals below   Remaining deficits: Hypokinetic dysarthria with fatigue; cognitive linguistic impairment (mild)   Education / Equipment: Compensations for dysarthria; external reminders to carryover compenations and schedule to complete OT/ST/PT exercises Plan: Patient agrees to discharge.  Patient goals were met. Patient is being discharged due to meeting the stated rehab goals.  ?????          SLP Short Term Goals - 12/02/17 1011      SLP SHORT TERM GOAL #1   Title  Pt will average low-mid 90sdB with loud /a/ for 3 sessions with rare min A    Time  1    Period  Weeks    Status  Achieved  SLP SHORT TERM GOAL #2   Title  Pt will average low-mid 70sdB on structured speech tasks with rare min A for three sessions    Time  1    Period  Weeks    Status  Achieved      SLP SHORT TERM GOAL #3   Title  Pt will maintain average low 70s dB over 8 minute simple conversation with rare min A, for 2 sessions    Time  1    Period  Weeks    Status  Achieved      SLP SHORT TERM GOAL #4   Title  pt will maintain speech intelligibility at 95%+ using intelligibility compensations for 8 minutes simple conversation in three sesssions    Time  1    Period  Weeks      SLP SHORT TERM GOAL #5   Title  pt will demo knowledge of 4 different memory compenstions    Time  1    Period  Weeks    Status  On-going       SLP Long Term Goals - 12/02/17 1011      SLP LONG TERM GOAL #1   Title  Pt will maintain low 70sdB  over 10 minute mod complex conversation with rare min A in three therapy sessions    Baseline  11/02/17; 11/04/17; 11/09/17; 11/16/17    Time  4    Period  Weeks   or 17 sessions, for all LTGs   Status  Achieved      SLP LONG TERM GOAL #2   Title  Pt will maintain 95%+ speech intelligibility in noisy environment outside of tx room over 12 minutes with rare min A, in three sessions    Baseline  11/02/17; 11/09/17; 11/16/17; 12/02/17 (per pt report)    Time  3    Period  Weeks    Status  Achieved      SLP LONG TERM GOAL #3   Title  pt will demo one memory compensation in 2 sessions     Baseline  11/03/17; 11/11/17    Time  3    Period  Weeks    Status  Achieved      SLP LONG TERM GOAL #4   Title  pt will demo anticipatory awareness to independently double check his work in mod complex cognitive linguistic tasks in 4 sessions    Baseline  11/09/17 - 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 D/C ST at this time, pt in agreement. Will schedule 6 month PD screen with fron desk.     Speech Therapy Frequency  2x / week    Treatment/Interventions  SLP instruction and feedback;Oral motor exercises;Cueing hierarchy;Compensatory techniques;Cognitive reorganization;Functional tasks;Patient/family education;Multimodal communcation approach;Internal/external aids    Potential to Achieve Goals  Good       Patient will benefit from skilled therapeutic intervention in order to improve the following deficits and impairments:   Dysarthria and anarthria  Cognitive communication deficit    Problem List Patient Active Problem List   Diagnosis Date  Noted  . Parkinson disease ([place])      S[place] Pete, Annye Rusk MS, [place] 12/02/2017, 10:12 AM  [facility] [address] [place], [name], [address] Phone: [phone]   Fax:  [phone]   Name: Ronald Noble MRN: [id number] Date of Birth: 02-23-1946

## 2017-12-15 DIAGNOSIS — G245 Blepharospasm: Secondary | ICD-10-CM | POA: Diagnosis not present

## 2017-12-20 ENCOUNTER — Other Ambulatory Visit: Payer: Self-pay | Admitting: Neurology

## 2017-12-20 DIAGNOSIS — G479 Sleep disorder, unspecified: Secondary | ICD-10-CM

## 2017-12-20 DIAGNOSIS — G4752 REM sleep behavior disorder: Secondary | ICD-10-CM

## 2017-12-24 DIAGNOSIS — Z23 Encounter for immunization: Secondary | ICD-10-CM | POA: Diagnosis not present

## 2018-03-21 DIAGNOSIS — D1801 Hemangioma of skin and subcutaneous tissue: Secondary | ICD-10-CM | POA: Diagnosis not present

## 2018-03-21 DIAGNOSIS — D225 Melanocytic nevi of trunk: Secondary | ICD-10-CM | POA: Diagnosis not present

## 2018-03-21 DIAGNOSIS — L570 Actinic keratosis: Secondary | ICD-10-CM | POA: Diagnosis not present

## 2018-03-21 DIAGNOSIS — L430 Hypertrophic lichen planus: Secondary | ICD-10-CM | POA: Diagnosis not present

## 2018-03-21 DIAGNOSIS — Z85828 Personal history of other malignant neoplasm of skin: Secondary | ICD-10-CM | POA: Diagnosis not present

## 2018-03-21 DIAGNOSIS — D485 Neoplasm of uncertain behavior of skin: Secondary | ICD-10-CM | POA: Diagnosis not present

## 2018-03-21 DIAGNOSIS — D2271 Melanocytic nevi of right lower limb, including hip: Secondary | ICD-10-CM | POA: Diagnosis not present

## 2018-03-21 DIAGNOSIS — L814 Other melanin hyperpigmentation: Secondary | ICD-10-CM | POA: Diagnosis not present

## 2018-03-21 DIAGNOSIS — L821 Other seborrheic keratosis: Secondary | ICD-10-CM | POA: Diagnosis not present

## 2018-03-21 DIAGNOSIS — L565 Disseminated superficial actinic porokeratosis (DSAP): Secondary | ICD-10-CM | POA: Diagnosis not present

## 2018-03-21 DIAGNOSIS — L111 Transient acantholytic dermatosis [Grover]: Secondary | ICD-10-CM | POA: Diagnosis not present

## 2018-03-23 DIAGNOSIS — G479 Sleep disorder, unspecified: Secondary | ICD-10-CM | POA: Diagnosis not present

## 2018-03-23 DIAGNOSIS — Z79899 Other long term (current) drug therapy: Secondary | ICD-10-CM | POA: Diagnosis not present

## 2018-03-23 DIAGNOSIS — G20 Parkinson's disease: Secondary | ICD-10-CM | POA: Diagnosis not present

## 2018-03-23 DIAGNOSIS — G245 Blepharospasm: Secondary | ICD-10-CM | POA: Diagnosis not present

## 2018-03-23 DIAGNOSIS — Z9689 Presence of other specified functional implants: Secondary | ICD-10-CM | POA: Diagnosis not present

## 2018-04-26 ENCOUNTER — Other Ambulatory Visit: Payer: Self-pay | Admitting: Neurology

## 2018-04-26 DIAGNOSIS — G4752 REM sleep behavior disorder: Secondary | ICD-10-CM

## 2018-04-26 DIAGNOSIS — G479 Sleep disorder, unspecified: Secondary | ICD-10-CM

## 2018-04-27 DIAGNOSIS — R82998 Other abnormal findings in urine: Secondary | ICD-10-CM | POA: Diagnosis not present

## 2018-04-27 DIAGNOSIS — E559 Vitamin D deficiency, unspecified: Secondary | ICD-10-CM | POA: Diagnosis not present

## 2018-04-27 DIAGNOSIS — M8589 Other specified disorders of bone density and structure, multiple sites: Secondary | ICD-10-CM | POA: Diagnosis not present

## 2018-04-27 DIAGNOSIS — E7849 Other hyperlipidemia: Secondary | ICD-10-CM | POA: Diagnosis not present

## 2018-04-27 DIAGNOSIS — Z125 Encounter for screening for malignant neoplasm of prostate: Secondary | ICD-10-CM | POA: Diagnosis not present

## 2018-04-27 DIAGNOSIS — M859 Disorder of bone density and structure, unspecified: Secondary | ICD-10-CM | POA: Diagnosis not present

## 2018-04-27 NOTE — Telephone Encounter (Signed)
Ronald Noble Drug Registry checked. Pt is up to date on his appts and is due for a refill on clonazepam.

## 2018-04-28 ENCOUNTER — Encounter: Payer: Self-pay | Admitting: Neurology

## 2018-04-28 ENCOUNTER — Ambulatory Visit (INDEPENDENT_AMBULATORY_CARE_PROVIDER_SITE_OTHER): Payer: Medicare Other | Admitting: Neurology

## 2018-04-28 VITALS — BP 130/81 | HR 67 | Ht 71.5 in | Wt 158.0 lb

## 2018-04-28 DIAGNOSIS — G20 Parkinson's disease: Secondary | ICD-10-CM | POA: Diagnosis not present

## 2018-04-28 DIAGNOSIS — Z9689 Presence of other specified functional implants: Secondary | ICD-10-CM

## 2018-04-28 DIAGNOSIS — R498 Other voice and resonance disorders: Secondary | ICD-10-CM

## 2018-04-28 DIAGNOSIS — G4752 REM sleep behavior disorder: Secondary | ICD-10-CM | POA: Diagnosis not present

## 2018-04-28 NOTE — Progress Notes (Signed)
Subjective:    Patient ID: Ronald Noble is a 73 y.o. male.  HPI     Interim history:   Ronald Noble is a 73 year old right-handed gentleman with an underlying medical history of skin cancer including melanoma, enlarged prostate (s/p TURP), who presents for followup consultation of his R sided predominant Parkinson's disease, complicated by RBD and sleep disturbance as well as mild dyskinesias, with s/p bilateral DBS placements at [facility]. He is unaccompanied today. I last saw him on 10/26/2017, at which time he reported doing okay, tried Gocovri for one month, but it was cost prohibitive. He was back on amantadine 100 mg bid. He tried the reduced dose of amantadine but did not do well on the lower dose. He reported no recent falls. Was getting Botox injections for blepharospasm through [name]. He was on Sinemet 1-1/2 pills 5 times a day, long-acting Requip once daily at 5 PM. I asked him to continue with his medication regimen.  Today, 04/28/2018: He reports doing well, no recent falls, weight has been stable. He does not always sleep well, some nights he has very poor sleep and some of the night he will sleep very well. No significant triggers. No significant new stressors. Has regular follow-up at [facility] as well. He had a Botox injection appointment and programming appointment last month, no change in his programming settings. He is reading up on how gut flora can affect Parkinson's disease, he has been taking a probiotic. He also drinks milk with turmeric, ginger and cinnamon.    The patient's allergies, current medications, family history, past medical history, past social history, past surgical history and problem list were reviewed and updated as appropriate.    Previously (copied from previous notes for reference):   I saw him on 04/28/2017, at which time he reported some daytime somnolence. He was supposed to get cataract surgeries and colonoscopy done. He had done well after  Botox injections at [facility]. He was on amantadine twice daily and I suggested we reduce it to once daily and consider taking him off altogether. I suggested he take his long-acting Requip 6 mg once daily in the evening, instead of during the day. We kept his Sinemet at 1-1/2 pills 5 times a day.     I saw him on 10/29/2016, at which time he reported doing okay, he had no recent falls, his left eye ptosis improved after adjustment of his DBS. He did not end up having any Botox injections to his eyelid. He was on Sinemet 1-1/2 pills 5 times a day, 3 hourly intervals, starting approximately at 8 AM. He was on amantadine twice daily and Requip XL around 1 PM.    I saw him on 04/09/2016, at which time he was doing okay, he had improvement in his blood first specimen on the left in facial dyskinesias. He had been followed at [facility]. He was started on amantadine and was taking it twice daily. He was off of Stalevo completely. He felt that his abdominal cramping and facial dyskinesias improved after he came off of Stalevo altogether. He was in the process of being scheduled for Botox injections for his blepharospasm at [facility]. He was still on once daily long-acting Requip, when he tried to stop it completely he noticed a flareup of his RLS. He denied any recent falls. He was exercising regularly. He did have some constipation but generally under control with some regularity in his bowel movements reported.    I saw  him on 07/09/2015, at which time he reported no recent syncope. He had a tilt table test in January 2017 which showed abnormal findings but minimal symptoms reported. He saw Dr. Hartford Poli with vascular surgery for consultation on 05/28/2015. He had a 24-hour blood pressure monitor. He had a follow-up appointment pending with his vascular specialist. He also had an appointment pending with [name] in October 2017. He felt he pulled a muscle in his upper back on the right. He was on Stalevo  about 4 times a day, sometimes 5 times a day depending on his day to day activities. He was on long-acting Requip 6 mg once daily. He was sleeping a little better. He had been advised to start using compression stockings but had not started yet. He had noticed worsening of droopy eyelid. He mailed in December 2017 Re: Blepharospasm and treatment for this. He had been seeing an ophthalmologist.   I saw him on 03/11/2015, at which time he reported a recent fall in September 2016. He had just come back from a road trip and after being in the car for about 2-1/2 hours he got out of the car and fell to the ground for his wife even realize what happened. He fell on pavement and chipped his tooth. They were with some friends who helped. He had to have a crown placed over his chipped tooth. His DBS settings were adjusted when he had his follow-up at [facility]. His orthostatic blood pressure values were stable. Since his settings were changed he had no other syncopal spell. He did have some medication changes as well. He was no longer on clonazepam. Stalevo was 100 mg strength 4-5 times a day. Requip long-acting was 6 mg once daily. She was on long-acting melatonin 6 mg each night. He was advised to drink 4 bottles of water. He was scheduled for tilt table test as well. His wife was concerned about him climbing on ladders. We talked about gait safety and syncope risk. He was advised not to climb on any ladders or work at heights.    I saw him on 11/07/2014, at which time he reported doing reasonably well. He had nasal congestion after his fall and injury. Of note, unfortunately, he had a syncopal spell on 10/29/2014 and hit his nose as he fell and sustained a nasal fracture. I reviewed the emergency room records. He was referred to ENT. His laceration was repaired with Dermabond. He reported that he stood up and felt lightheaded and then passed out. He fell and hit his face. He had a CT head without contrast  as well as maxillofacial CT without contrast on 10/29/2014 which showed: RIGHT and LEFT nasal bone fractures. Small displaced fracture fragment on the LEFT. No skull fracture or intracranial hemorrhage. Unremarkable appearing deep brain stimulator leads. He fell at work. He had taken Levitra at night and 2 days prior he was working hard in the yard. He is notoriously not drinking enough water he admits. He had taken his Stalevo in the morning and was due for his next dose and became lightheaded when he got up quickly from his desk. He had been sitting for about 45 minutes consistently at his desk. He did have a warning sign and kept walking instead of sitting down again. He had a follow-up appointment at [facility] on 11/02/2014 and his Requip was increased slightly again. He had re-programming of his DBS as well. He had reduced the Requip XL to 3 mg twice  daily but this was increased to 4 mg twice daily. His Stalevo was at 4-6 pills daily depending on his work day. He does get sleepy at night around 9:30 and often falls asleep while watching TV. He tried Melatonin long-acting but in combination with clonazepam at night it did not work well and in fact he had insomnia. He tried to reduce his clonazepam to 0.25 mg each night but increased it back to 0.5 mg each night. For his nose fracture he saw Dr. Redmond Baseman and was scheduled for surgery. I did not change his medications. I asked him to drink more water and reminded him to change positions slowly.   I saw him on 08/07/2014, at which time he reported doing quite well after his DBS placement. His wife felt that he was doing great. He was eating well. He was not drinking enough water. He had trace edema in his distal lower extremities. He had 2 appointments for DBS programming. His Stalevo was reduced from 6 pills a day to 4 pills a day but then he increased it back to 5 pills a day. He was off generic Sinemet. He was on Azilect once daily and Requip long-acting 8 mg  twice daily.    He's been back to work. He is currently on Azilect once daily, Requip XL 8 mg twice daily, Stalevo 100 mg strength one pill 5 times a day and he has stopped the carbidopa-levodopa. I reviewed his outside records from Dr. Salomon Fick at [facility] neurosurgery. Patient had bilateral STN DBS electrodes placed on 06/01/2014. He had pulse generators placed approximately 3 weeks after that, 06/13/14.   I saw him on 02/01/2014, at which time he reported difficulty staying asleep. He had not consistently tried melatonin. He had problems with urinary urgency and was advised to try Kegel exercises by his urologist. His PCP talked to an infectious diseases specialist and it was determined he did not actually have Lyme disease.    He did not show for an appointment on 01/30/2014 - he genuinely forgot!   I saw him on 12/19/2013, at which time he reported that his medication was not working as well. He reported more freezing episodes and more off time. Of note, he had been using a new protein milkshake that he was drinking first thing in the morning. He had lost some weight and in an effort to improve his weight loss he had started this new supplements. His primary care physician had referred him to a nutritionist. We also talked about DBS evaluation and he requested to see another specialist for DBS candidacy. I referred him to [name] at [facility] I also advised him to discontinue using the new protein supplement as I felt it may be interfering with the absorption of his Stalevo. I also suggested a trial of Parcopa half a pill 3 times a day at 11, 2 PM and 7 PM and we kept the Stalevo the same. He was advised to use MiraLAX as needed for constipation.   I saw him on 07/31/13, at which time [name], including swimming, yoga, and weights. He has been on Stalevo 100 mg every 2 1/2 hours starting at 7:30 AM for 6  doses. He stopped clonazepam, as he felt some daytime sluggishness. He has been taking C/L 1/2 tid in between. He had no cognitive issues, but did have some "downtime" midday. I did not change his medications. He  had an appointment with [name] at [facility] in June, but there was a mix up with his appointment and he does not have an appointment until next year.   In the interim, he was diagnosed with Lyme d/s in July. He was tested and treated by his PCP has has been referred to an infectious d/s doctor. He brings in his latest lying test results from 12/05/2013 which I reviewed: It looks like his IgM antibody status is negative but his IgG antibody status has not quite turn positive as far as I understand the report. That means he is probably in the subacute or early chronic phase if I interpret this correctly. He requested a sooner than scheduled appointment, due to feeling more freezing and more off time. This starts in the late morning hours but can also go into the afternoon after lunch. Cognitively and mood wise he feels stable. He does not sleep well at night. He has not consistently tried melatonin I recommended.   I saw him on 01/30/2013, at which time I felt he was stable. We talked about DBS surgery. I did not make any medication changes. He is aware of the increase risk of melanoma associated with DAs and his dermatologist is aware that he is taking Requip XL.   I first met him on 09/27/2012, which time I did not make any changes to his medication and did not order any new test as he was stable. He saw [name] at [facility] and discussed DBS in the past but was not deemed a cadidate for it at the time as he was doing well. He was told by [name] to stay on schedule with his medication. 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 Sleep has been an issue. He goes to sleep well, but wakes up after 45 minutes to 1 hour. He is seeing a urologist. He exercises regularly.   He previously  followed with [name] and was last seen by him on 05/25/2012, at which time [name] felt he was stable, but because of off time at 2 hours he increased his carbidopa-levodopa by adding half of a 10-100 mg strength 3 times a day to his regimen. He recommended decreasing his Stalevo to 125 mg from 100 mg 6 times a day. He has been on Stalevo 125 mg 6 times a day, at 7:30, 10, 12:30, 3 PM, 5:30 PM and 8 PM. He is on clonazepam 0.5 mg at night, Requip long-acting 6 mg twice daily, rasagiline 1 mg once daily, carbidopa-levodopa 10-100 mg strength half a tablet 3 times a day, 7:30 to 10, then midday and later in the evening.   He was followed by [name] since 2008 with a history of Parkinson's disease. He has a history of increased tone and slowness as well as tremor with R sided predominance. He was initially placed on Requip XL and then rasagiline was added in October 2008. In February 2010 Stalevo was added. He had low vitamin D levels in 2008. ANA, ESR, ceruloplasmin were normal except for slightly low ceruloplasmin of 16. Urine copper test was negative. Methylmalonic acid was normal. Urine for heavy metals is normal as well. MRI brain with and without contrast on 01/07/2007 was normal. He had a sleep study in October 2008 showing PLMs and evidence of RBD. Repeat sleep study in September 2011 showed supine obstructive sleep apnea and RBD. He had a CPAP titration study in late September  2011 which was not very successful. He has been on clonazepam at night. He has not noticed any compulsive behaviors, memory loss, depression, swelling, or orthostatic dizziness. He recently had a skin biopsy showing atypical cells, concerning for melanoma.   He works full-time as an Forensic psychologist, [employer]. He exercises regularly and does yoga.    His Past Medical History Is Significant For: Past Medical History:  Diagnosis Date  . BPH (benign prostatic hyperplasia)   . Parkinson disease ([place])   . Skin cancer  of face    arms and back  . Sleep apnea    mild  . Spider bite    brown recluse/ left ankle/08/13    His Past Surgical History Is Significant For: Past Surgical History:  Procedure Laterality Date  . BURR HOLE W/ STEREOTACTIC INSERTION OF DBS LEADS / INTRAOP MICROELECTRODE RECORDING  04/2014  . COLONOSCOPY    . KNEE SURGERY     exploratory/left  . SKIN CANCER EXCISION  01/2014   face  . TRANSURETHRAL RESECTION OF PROSTATE     2008    His Family History Is Significant For: Family History  Problem Relation Age of Onset  . COPD Mother   . Cancer Father        Bladder  . Colon cancer Neg Hx   . Esophageal cancer Neg Hx   . Stomach cancer Neg Hx     His Social History Is Significant For: Social History   Socioeconomic History  . Marital status: Married    Spouse name: Levada Dy  . Number of children: 4  . Years of education: 80  . Highest education level: Not on file  Occupational History  . Occupation: LAWYER    Employer: [employer]  . Financial resource strain: Not on file  . Food insecurity:    Worry: Not on file    Inability: Not on file  . Transportation needs:    Medical: Not on file    Non-medical: Not on file  Tobacco Use  . Smoking status: Never Smoker  . Smokeless tobacco: Never Used  Substance and Sexual Activity  . Alcohol use: Yes    Alcohol/week: 5.0 standard drinks    Types: 3 Glasses of wine, 2 Cans of beer per week    Comment: occasional  . Drug use: No  . Sexual activity: Not on file  Lifestyle  . Physical activity:    Days per week: Not on file    Minutes per session: Not on file  . Stress: Not on file  Relationships  . Social connections:    Talks on phone: Not on file    Gets together: Not on file    Attends religious service: Not on file    Active member of club or organization: Not on file    Attends meetings of clubs or organizations: Not on file    Relationship status: Not on file  Other Topics  Concern  . Not on file  Social History Narrative   Patient is right handed and resides with wife   1-2 cups of coffee a day     His Allergies Are:  No Known Allergies:   His Current Medications Are:  Outpatient Encounter Medications as of 04/28/2018  Medication Sig  . amantadine (SYMMETREL) 100 MG capsule   . carbidopa-levodopa (SINEMET IR) 25-100 MG tablet Take 1.5 tablets by mouth 5 (five) times daily.  . clonazePAM (KLONOPIN) 0.25 MG disintegrating tablet Take 1 tablet (  0.25 mg total) by mouth at bedtime.  . Probiotic Product (PROBIOTIC PO) Take by mouth.  . Ropinirole HCl 6 MG TB24 2 (two) times daily.   . [DISCONTINUED] desmopressin (DDAVP) 0.2 MG tablet Take 0.6 mg by mouth.   Facility-Administered Encounter Medications as of 04/28/2018  Medication  . 0.9 %  sodium chloride infusion  :  Review of Systems:  Out of a complete 14 point review of systems, all are reviewed and negative with the exception of these symptoms as listed below: Review of Systems  Neurological:       Pt presents today to discuss his PD. Pt reports that he is stable. Pt reports that he is drinking a cup of "golden milk" in the evenings. Pt has questions about probiotics.    Objective:  Neurological Exam  Physical Exam Physical Examination:   Vitals:   04/28/18 1507  BP: 130/81  Pulse: 67    General Examination: The patient is a very pleasant 73 y.o. male in no acute distress. He appears well-developed and well-nourished and well groomed.   HEENT:Normocephalic, atraumatic, pupils are equal, round and reactive to light and accommodation. Extraocular tracking shows mild saccadic breakdown without nystagmus noted. There is mild limitation to upgaze, and no significant blepharospasmof the left eye. Neck is moderately rigid with neck tilt to the R Oropharynx exam reveals mild mouth dryness. No significant airway crowding is noted. Mallampati is class II. Tongue protrudes centrally and palate elevates  symmetrically. There is no drooling. There are no dyskinesias. His stimulator wires are tunneled on the left side.   Chest:is clear to auscultation without wheezing, rhonchi or crackles noted. His generator site is not tender.   Heart:sounds are regular and normal without murmurs, rubs or gallops noted.   Abdomen:is soft, non-tender and non-distended with normal bowel sounds appreciated on auscultation.  Extremities:There isno obvious edema in the distal lower extremities bilaterally.  Skin: is warm and dry with no trophic changes noted. Age-related changes are noted on the skin and scars from skin cancer removals.   Musculoskeletal: exam reveals no obvious joint deformities, tenderness, joint swelling or erythema, with the exception of low back pain reported and right shoulder pain. Upper body tilt to R.  Neurologically:  Mental status: The patient is awake and alert, paying good attention. He is able to completely provide the history. He is oriented to: person, place, time/date, situation, day of week, month of year and year. His memory, attention, language and knowledge areappropriate.There is no aphasia, agnosia, apraxia or anomia. There is amilddegree of bradyphrenia. Speech is mild to moderately hypophonic with minimal dysarthria noted. Mood is congruent and affect is normal.   Cranial nerves are as described above under HEENT exam. In addition, shoulder shrug is normal with equal shoulder height noted.  Motor exam: Normal bulk, and strength for age is noted. There are no dyskinesias noted today.No resting tremor. Tone is mildly rigid with presence of cogwheeling in the right upper extremity. There is overall mild to moderate bradykinesia. There is no drift or rebound.  There in anintermittent myoclonus in the upper body.  Romberg is negative, with the exception of mild sway.  Fine motor skills exam: Fine motor skills are mild to moderately impaired, more  pronounced on the R than L, but stable. Cerebellar testing shows no dysmetria or intention tremor. There is no truncal or gait ataxia.  Sensory exam is intact to light touch throughout.  Gait, station and balance: He stands up from the seated position  with no significant difficulty and does not need to push up with His hands. He needs no assistance. No veering to one side is noted. He isnoted to lean to the right slightly and posture mildly stooped. He walks withdecreased arm swing bilaterally. Balance is mildly impaired.   Assessment and Plan:   In summary, Forest Redwine is a very pleasant49 year old male with a history of right-sided predominant Parkinson's disease, complicated by RBD, sleep disturbance, blepharospasm, generalized dyskinesias, motor fluctuations, orthostatic hypotension, syncopal spells, falls with injuries in the past, constipation and bladder hyperactivity,and nows/p b/l DBS placement at [facility] on 06/01/14 and generator placement on 06/13/2014. Overall, he has done well,has had some interim issues with low back pain, and arthritis in his right shoulder, some sleep disturbance. He did have 2 distinct syncopal spells that led to collapse, falls, and injuries. He sustained Fx of nose when he fell in his office, and then also fell in his driveway and chipped a tooth, needed a crown placed. He hadto have nose surgery. He saw a vascular specialist and had a tilt table test at [facility]. He had a24-hour blood pressure monitor test. Hewas started on amantadine for dyskinesias. He had a one month trial of Gocovri, but a Rx was cost prohibitive and he is back to amantadine twice a day. He tried to reduce it to once daily but found it not as effective. He is getting regular Botox injection for blepharospasm at [facility]. He used to be on Stalevo in the past but was able to stop this in or around early 2018. I suggested he continue with the current regimen of clonazepam namely 0.25 mg at  night for his REM behavior disorder and nighttime sleep disruption. He is encouraged to retry melatonin. He is advised to continue with Sinemet at the current dose , 1-1/2 pills 5 times a day and Requip 6 mg every evening. I suggested a 6 month follow-up, sooner as needed. I answered all his questions today and the patient was in agreement. I spent 25 minutes in total face-to-face time with the patient, more than 50% of which was spent in counseling and coordination of care, reviewing test results, reviewing medication and discussing or reviewing the diagnosis of PD, its prognosis and treatment options. Pertinent laboratory and imaging test results that were available during this visit with the patient were reviewed by me and considered in my medical decision making (see chart for details).

## 2018-04-28 NOTE — Patient Instructions (Addendum)
Please keep up the good work, I think you are stable and doing well.  Keep your meds the same.  Follow up in 6 months.

## 2018-04-29 DIAGNOSIS — Z1212 Encounter for screening for malignant neoplasm of rectum: Secondary | ICD-10-CM | POA: Diagnosis not present

## 2018-05-04 DIAGNOSIS — Z0000 Encounter for general adult medical examination without abnormal findings: Secondary | ICD-10-CM | POA: Diagnosis not present

## 2018-05-04 DIAGNOSIS — Z1339 Encounter for screening examination for other mental health and behavioral disorders: Secondary | ICD-10-CM | POA: Diagnosis not present

## 2018-05-04 DIAGNOSIS — E7849 Other hyperlipidemia: Secondary | ICD-10-CM | POA: Diagnosis not present

## 2018-05-04 DIAGNOSIS — G4733 Obstructive sleep apnea (adult) (pediatric): Secondary | ICD-10-CM | POA: Diagnosis not present

## 2018-05-04 DIAGNOSIS — M545 Low back pain: Secondary | ICD-10-CM | POA: Diagnosis not present

## 2018-05-04 DIAGNOSIS — M546 Pain in thoracic spine: Secondary | ICD-10-CM | POA: Diagnosis not present

## 2018-05-04 DIAGNOSIS — G20 Parkinson's disease: Secondary | ICD-10-CM | POA: Diagnosis not present

## 2018-05-04 DIAGNOSIS — Z1331 Encounter for screening for depression: Secondary | ICD-10-CM | POA: Diagnosis not present

## 2018-05-04 DIAGNOSIS — Z6822 Body mass index (BMI) 22.0-22.9, adult: Secondary | ICD-10-CM | POA: Diagnosis not present

## 2018-05-04 DIAGNOSIS — R55 Syncope and collapse: Secondary | ICD-10-CM | POA: Diagnosis not present

## 2018-05-04 DIAGNOSIS — M859 Disorder of bone density and structure, unspecified: Secondary | ICD-10-CM | POA: Diagnosis not present

## 2018-05-04 DIAGNOSIS — H6980 Other specified disorders of Eustachian tube, unspecified ear: Secondary | ICD-10-CM | POA: Diagnosis not present

## 2018-05-09 ENCOUNTER — Other Ambulatory Visit: Payer: Self-pay | Admitting: Internal Medicine

## 2018-05-09 DIAGNOSIS — E785 Hyperlipidemia, unspecified: Secondary | ICD-10-CM

## 2018-06-08 DIAGNOSIS — N5201 Erectile dysfunction due to arterial insufficiency: Secondary | ICD-10-CM | POA: Diagnosis not present

## 2018-06-08 DIAGNOSIS — R351 Nocturia: Secondary | ICD-10-CM | POA: Diagnosis not present

## 2018-06-08 DIAGNOSIS — N401 Enlarged prostate with lower urinary tract symptoms: Secondary | ICD-10-CM | POA: Diagnosis not present

## 2018-06-08 DIAGNOSIS — R972 Elevated prostate specific antigen [PSA]: Secondary | ICD-10-CM | POA: Diagnosis not present

## 2018-06-16 ENCOUNTER — Ambulatory Visit: Payer: Medicare Other | Attending: Internal Medicine | Admitting: Physical Therapy

## 2018-06-16 ENCOUNTER — Ambulatory Visit: Payer: Medicare Other

## 2018-06-16 ENCOUNTER — Other Ambulatory Visit: Payer: Self-pay

## 2018-06-16 ENCOUNTER — Ambulatory Visit: Payer: Medicare Other | Admitting: Occupational Therapy

## 2018-06-16 DIAGNOSIS — R471 Dysarthria and anarthria: Secondary | ICD-10-CM

## 2018-06-16 DIAGNOSIS — R278 Other lack of coordination: Secondary | ICD-10-CM

## 2018-06-16 DIAGNOSIS — R41841 Cognitive communication deficit: Secondary | ICD-10-CM

## 2018-06-16 DIAGNOSIS — R2689 Other abnormalities of gait and mobility: Secondary | ICD-10-CM | POA: Insufficient documentation

## 2018-06-16 NOTE — Therapy (Signed)
[facility] [address] [place], [name], [address] Phone: [phone]   Fax:  [phone]  Patient Details  Name: Ronald Noble MRN: [id number] Date of Birth: Jul 02, 1945 Referring Provider:  [name], MD  Encounter Date: 06/16/2018   Physical Therapy Parkinson's Disease Screen   Timed Up and Go test:6.82 sec (compared to 10.75 sec)  10 meter walk test:4.46 ft/sec (compared to 3.56 ft/sec)  5 time sit to stand test: 7.62 sec (compared to 8.5 sec)    Patient does not require Physical Therapy services at this time.  Recommend Physical Therapy screen in 6-9 months.     Macy Lingenfelter W. 06/16/2018, 12:11 PM   Mady Haagensen, PT 06/16/18 12:13 PM Phone: [phone] Fax: [facility] [address] [place], [name], [address] Phone: [phone]   Fax:  [phone]

## 2018-06-16 NOTE — Therapy (Signed)
[facility] [address] [place], [name], [address] Phone: [phone]   Fax:  [phone]  Patient Details  Name: Ronald Noble MRN: [id number] Date of Birth: 10-10-1945 Referring Provider:  Star Age, MD  Encounter Date: 06/16/2018  Speech Therapy Parkinson's Disease Screen   Decibel Level today: average low 70sdB  (WNL=70-72 dB) with sound level meter 30cm away from pt's mouth. Pt's conversational volume is at WNL levels. Pt's rate of speech in 10 minutes of conversation was not detracting from his overall intelligibility. He reports if a client or his wife does not understand him he asks if he needs to repeat.  Pt does not report difficulty in swallowing warranting further evaluation.  Pt does does not require speech therapy services at this time. Recommend ST screen in another approx 6 months.     [facility] ,[place], [place]  06/16/2018, 10:59 AM  [facility] [address] [place], [name], [address] Phone: [phone]   Fax:  [phone]

## 2018-06-17 NOTE — Therapy (Signed)
[facility] [address] [place], [name], [address] Phone: [phone]   Fax:  [phone]  Patient Details  Name: Ronald Noble MRN: [id number] Date of Birth: [birth year] Referring Provider:  [name], MD  Encounter Date: 06/16/2018 Occupational Therapy Parkinson's Disease Screen  Hand dominance: RUE    Physical Performance Test item #4 (donning/doffing jacket):  27.16 sec  Fastening/unfastening 3 buttons in:  90sec  9-hole peg test:    RUE  32.38  sec        LUE  41.19 sec  Change in ability to perform ADLs/IADLs: decreased fine motor coordination    Pt would benefit from occupational therapy evaluation due to  Decline in ADLS, coordination

## 2018-06-22 DIAGNOSIS — G245 Blepharospasm: Secondary | ICD-10-CM | POA: Diagnosis not present

## 2018-08-08 DIAGNOSIS — Z85828 Personal history of other malignant neoplasm of skin: Secondary | ICD-10-CM | POA: Diagnosis not present

## 2018-08-08 DIAGNOSIS — D1801 Hemangioma of skin and subcutaneous tissue: Secondary | ICD-10-CM | POA: Diagnosis not present

## 2018-08-08 DIAGNOSIS — L565 Disseminated superficial actinic porokeratosis (DSAP): Secondary | ICD-10-CM | POA: Diagnosis not present

## 2018-08-08 DIAGNOSIS — L570 Actinic keratosis: Secondary | ICD-10-CM | POA: Diagnosis not present

## 2018-08-08 DIAGNOSIS — L720 Epidermal cyst: Secondary | ICD-10-CM | POA: Diagnosis not present

## 2018-08-18 ENCOUNTER — Telehealth: Payer: Self-pay

## 2018-08-18 DIAGNOSIS — G20 Parkinson's disease: Secondary | ICD-10-CM

## 2018-08-18 NOTE — Telephone Encounter (Signed)
Referral has been sent.

## 2018-08-18 NOTE — Telephone Encounter (Signed)
-----   Message from Star Age, MD sent at 08/18/2018  7:29 AM EDT ----- Regarding: FW: OT referral Can you put ref to neuro rehab in for OT to address fine motor deficits as rec by Curt Bears? Thx sa ----- Message ----- From: Hulda Marin, OT Sent: 08/17/2018   8:52 PM EDT To: Star Age, MD Subject: OT referral                                    Dr. Rexene Alberts, Ronald Noble was seen for a PD screen in March prior to our clinic closure/ reduction of hours. He can benefit from a referral to OT to address fine motor coordination  deficits. If you agree please place this referral, we are increasing our clinic hours in June. Sincerely, Theone Murdoch, OTR/L

## 2018-09-05 ENCOUNTER — Ambulatory Visit: Payer: Medicare Other | Attending: Internal Medicine | Admitting: Occupational Therapy

## 2018-09-05 ENCOUNTER — Encounter: Payer: Self-pay | Admitting: Occupational Therapy

## 2018-09-05 ENCOUNTER — Other Ambulatory Visit: Payer: Self-pay

## 2018-09-05 DIAGNOSIS — R293 Abnormal posture: Secondary | ICD-10-CM | POA: Diagnosis not present

## 2018-09-05 DIAGNOSIS — R2681 Unsteadiness on feet: Secondary | ICD-10-CM | POA: Diagnosis not present

## 2018-09-05 DIAGNOSIS — R278 Other lack of coordination: Secondary | ICD-10-CM | POA: Diagnosis not present

## 2018-09-05 DIAGNOSIS — R29818 Other symptoms and signs involving the nervous system: Secondary | ICD-10-CM | POA: Insufficient documentation

## 2018-09-05 DIAGNOSIS — R29898 Other symptoms and signs involving the musculoskeletal system: Secondary | ICD-10-CM | POA: Insufficient documentation

## 2018-09-05 DIAGNOSIS — R41840 Attention and concentration deficit: Secondary | ICD-10-CM | POA: Diagnosis not present

## 2018-09-05 DIAGNOSIS — M25612 Stiffness of left shoulder, not elsewhere classified: Secondary | ICD-10-CM | POA: Diagnosis not present

## 2018-09-05 DIAGNOSIS — R2689 Other abnormalities of gait and mobility: Secondary | ICD-10-CM | POA: Diagnosis not present

## 2018-09-05 DIAGNOSIS — M25611 Stiffness of right shoulder, not elsewhere classified: Secondary | ICD-10-CM | POA: Insufficient documentation

## 2018-09-05 NOTE — Therapy (Signed)
[facility] [address] [place], [name], [address] Phone: [phone]   Fax:  [phone]  Occupational Therapy Evaluation  Patient Details  Name: Ronald Noble MRN: [id number] Date of Birth: 1945/08/10 Referring Provider (OT): Dr. Star 73   Encounter Date: 09/05/2018  OT End of Session - 09/05/18 1053    Visit Number  1    Number of Visits  17    Date for OT Re-Evaluation  11/04/18    Authorization Type  Medicare & BCBS, covered 100%    Authorization - Visit Number  1    Authorization - Number of Visits  10    OT Start Time  0802    OT Stop Time  0845    OT Time Calculation (min)  43 min    Activity Tolerance  Patient tolerated treatment well    Behavior During Therapy  [facility] for tasks assessed/performed       Past Medical History:  Diagnosis Date  . BPH (benign prostatic hyperplasia)   . Parkinson disease ([place])   . Skin cancer of face    arms and back  . Sleep apnea    mild  . Spider bite    brown recluse/ left ankle/08/13    Past Surgical History:  Procedure Laterality Date  . BURR HOLE W/ STEREOTACTIC INSERTION OF DBS LEADS / INTRAOP MICROELECTRODE RECORDING  04/2014  . COLONOSCOPY    . KNEE SURGERY     exploratory/left  . SKIN CANCER EXCISION  01/2014   face  . TRANSURETHRAL RESECTION OF PROSTATE     2008    There were no vitals filed for this visit.  Subjective Assessment - 09/05/18 0810    Subjective   Pt reports difficulty with acceleration with ambulation that has started since PT screen in March    Pertinent History  PD diagnosed in 2008, bilateral DBS 2016    Limitations  fall risk, bilateral DBS    Patient Stated Goals  improve coordination    Currently in Pain?  No/denies   none today, but chronic pain in hips R>L, low back       [facility] OT Assessment - 09/05/18 0001      Assessment   Medical Diagnosis  Parkinson's disease    Referring Provider (OT)  Dr. Star 72     Onset Date/Surgical Date  06/16/18   PD screen   Hand Dominance  Right    Prior Therapy  last OT 10/2017      Precautions   Precautions  Fall    Precaution Comments  --   DBS      Balance Screen   Has the patient fallen in the past 6 months  Yes    How many times?  7   controlled, difficulty stopping     Home  Environment   Family/patient expects to be discharged to:  Private residence    [place] to live on main level with bedroom/bathroom    Lives With  Spouse      Prior Function   Level of Independence  Independent    Vocation  Full time employment    Vocation Requirements  attorney    Leisure  was doing yoga, weights, swimming, exercise bike prior to Covid closures      ADL   Eating/Feeding  Modified independent   min spills   Grooming  Modified independent   electric toothbrush  Upper Body Bathing  Modified independent    Lower Body Bathing  Modified independent    Upper Body Dressing  Increased time;Independent    Lower Body Dressing  Increased time    Toilet Transfer  Modified independent    Toileting - Clothing Manipulation  Increased time    Toileting -  Hygiene  Modified Independent    Tub/Shower Transfer  Modified independent   walk in shower, seat, no grab bars     IADL   Light Housekeeping  --   performing yardwork, back pain   Prior Level of Function Community Mobility  pt reports that wife feels that he is driving too fast, particularly around curves.  Posture affects seeing in rearview mirror    Community Mobility  Drives own vehicle    Medication Management  Is responsible for taking medication in correct dosages at correct time      Mobility   Mobility Status  Freezing;History of falls    Mobility Status Comments  acceleration, difficulty stopping, "controlled falls"      Written Expression   Dominant Hand  Right    Handwriting  --   using dictation/typing (difficulty, using dictation more)     Vision -  History   Baseline Vision  Wears glasses only for reading    Visual History  Cataracts    Additional Comments  pt denies changes, WFL      Cognition   Overall Cognitive Status  Impaired/Different from baseline    Memory  Impaired    Memory Impairment  Decreased short term memory    Cognition Comments  impulsive, decr safety awareness      Observation/Other Assessments   Standing Functional Reach Test  RUE 7.5 inches, LUE 8.5 inches    Simulated Eating Time (seconds)  9.22    Donning Doffing Jacket Time (seconds)  22.78    Donning Doffing Jacket Comments  --      Posture/Postural Control   Posture/Postural Control  Postural limitations    Postural Limitations  Rounded Shoulders;Weight shift right;Flexed trunk;Forward head      Coordination   9 Hole Peg Test  Right;Left    Right 9 Hole Peg Test  39.31    Left 9 Hole Peg Test  38.0    Box and Blocks  RUE 49 blocks, LUE 43 blocks    Coordination  difficulty moving mouse with RUE      Tone   Assessment Location  Right Upper Extremity;Left Upper Extremity      ROM / Strength   AROM / PROM / Strength  AROM      AROM   Overall AROM   Deficits    Overall AROM Comments  bilateral decr shoulder flex, elbow ext, decr L MP ext      RUE Tone   RUE Tone  --   rigidity     LUE Tone   LUE Tone  --   rigidity, dystonia                     OT Education - 09/05/18 1050    Education Details  Recommended request for PT referral due to changes in gait and falls; Recommended against jogging due to fall risk; Recommended pt perform LB washing/dressing in sitting due to fall risk    Person(s) Educated  Patient    Methods  Explanation    Comprehension  Verbalized understanding       OT Short Term Goals - 09/05/18 1123  OT SHORT TERM GOAL #1   Title  Pt will be independent with updated PD specific HEP--check STGs 10/05/18    Time  4    Period  Weeks    Status  New      OT SHORT TERM GOAL #2   Title  Pt will be  able to sustain upright posture for at least 5 min during seated functional task for incr ease using mouse and incr safety for driving.    Baseline  leans to the R and uses RUE support    Time  4    Period  Weeks    Status  New      OT SHORT TERM GOAL #3   Title  Pt will demo improved coordination/functional reaching for ADLs as shown by improving score on box and blocks test by at least 3 bilaterally.    Baseline  R-49 blocks, L-43 blocks    Time  4    Period  Weeks    Status  New      OT SHORT TERM GOAL #4   Title  Assess 3 button fasten/unfasten and establish goal    Baseline  --    Time  4    Period  Weeks    Status  New      OT SHORT TERM GOAL #5   Title  -----    Baseline  ---    Time  --    Period  --    Status  --        OT Long Term Goals - 09/05/18 1131      OT LONG TERM GOAL #1   Title  Pt will verbalize understanding of adaptive strategies to incr safety/ease with ADL's/IADLS's--LTGs due 5/95/63    Time  8    Period  Weeks    Status  New      OT LONG TERM GOAL #2   Title  Pt will improve bilateral functional reach and balance as shown by improving standing functional reach by at least 2" bilaterally.    Baseline  RUE 7.5, LUE 8.5    Time  8    Period  Weeks    Status  New   11 inches     OT LONG TERM GOAL #3   Title  Pt will be able to sustain upright posture for at least 8 min during seated functional task for incr ease using mouse and incr safety for driving.      OT LONG TERM GOAL #4   Title  Pt will demo improved coordination/functional reaching for ADLs as shown by improving score on box and blocks test by at least 6 bilaterally.    Baseline  R-49, L-43 blocks    Time  8    Period  Weeks    Status  New   135 shoulder flexion, -5     OT LONG TERM GOAL #5   Title  ------            Plan - 09/05/18 1056    Clinical Impression Statement  Pt is a 74 y.o. male with Parkinson's disease.  Pt is s/p DBS (approx 4-5 years ago).  Pt with PMH  that includes:  hx of skin CA, sleep apnea.  Pt is known to this OT from previous therapy (last OT 10/2017).  Pt reports that he feels that his posture is not as good which is affecting ADLs.  Pt also reports changes in gait and falls.  Pt presents today with bradykinesia, rigidity, decr coordination, decr posture, decr functional mobility/balance for ADLs, decr ROM, dystonia.  Pt would benefit from occupational therapy to address these deficits for improved ADL/IADL performance, improved UE functional use, and to decr risk of future complications related to PD.    OT Occupational Profile and History  Detailed Assessment- Review of Records and additional review of physical, cognitive, psychosocial history related to current functional performance    Occupational Profile and client history currently impacting functional performance  Pt works full time as an Forensic psychologist, he reports slowing of ADLs, incr difficulty with gait/falls, and leaning to R side.  PMH: PD diagnosed in 2008, s/p bilateral DBS, skin cancer, sleep apnea, BPH, TURP, orthostatic hypotension, syncopal episodes; arthritic changes in spine with hx of back pain    Occupational performance deficits (Please refer to evaluation for details):  ADL's;IADL's;Work;Leisure;Social Participation    Body Structure / Function / Physical Skills  ADL;Coordination;Endurance;UE functional use;GMC;Decreased knowledge of precautions;Balance;Decreased knowledge of use of DME;Flexibility;IADL;Pain;FMC;Dexterity;Mobility;Gait;ROM;Tone   bradykinesia   Cognitive Skills  Attention;Safety Awareness;Memory    Rehab Potential  Good    Clinical Decision Making  Several treatment options, min-mod task modification necessary    Comorbidities Affecting Occupational Performance:  May have comorbidities impacting occupational performance    Modification or Assistance to Complete Evaluation   Min-Moderate modification of tasks or assist with assess necessary to complete eval     OT Frequency  2x / week    OT Duration  8 weeks    OT Treatment/Interventions  Self-care/ADL training;Therapeutic exercise;Patient/family education;Moist Heat;Energy conservation;Therapist, nutritional;Therapeutic activities;Balance training;Cognitive remediation/compensation;Passive range of motion;Manual Therapy;DME and/or AE instruction;Cryotherapy;Fluidtherapy;Aquatic Therapy;Neuromuscular education    Plan  work on posture and shoulder ROM, assess 3 button fasten/unfasten    Recommended Other Services  physical therapy due to falls/changes in gait    Consulted and Agree with Plan of Care  Patient       Patient will benefit from skilled therapeutic intervention in order to improve the following deficits and impairments:  Body Structure / Function / Physical Skills, Cognitive Skills, Psychosocial Skills  Visit Diagnosis: Other symptoms and signs involving the nervous system  Other symptoms and signs involving the musculoskeletal system  Other lack of coordination  Other abnormalities of gait and mobility  Stiffness of right shoulder, not elsewhere classified  Stiffness of left shoulder, not elsewhere classified  Abnormal posture  Unsteadiness on feet  Attention and concentration deficit    Problem List Patient Active Problem List   Diagnosis Date Noted  . Parkinson disease [facility])     [facility] 09/05/2018, 11:35 AM  [facility] [address] [place], [name], [address] Phone: [phone]   Fax:  [phone]  Name: Dearion Huot MRN: [id number] Date of Birth: [birth year]   Vianne Bulls, OTR/L [facility] [address] [place],   [address] [phone] phone [phone] 09/05/18 11:35 AM

## 2018-09-08 ENCOUNTER — Other Ambulatory Visit: Payer: Self-pay

## 2018-09-08 ENCOUNTER — Encounter: Payer: Self-pay | Admitting: Occupational Therapy

## 2018-09-08 ENCOUNTER — Telehealth: Payer: Self-pay | Admitting: Occupational Therapy

## 2018-09-08 ENCOUNTER — Ambulatory Visit: Payer: Medicare Other | Admitting: Occupational Therapy

## 2018-09-08 DIAGNOSIS — R2681 Unsteadiness on feet: Secondary | ICD-10-CM

## 2018-09-08 DIAGNOSIS — G20 Parkinson's disease: Secondary | ICD-10-CM

## 2018-09-08 DIAGNOSIS — R278 Other lack of coordination: Secondary | ICD-10-CM | POA: Diagnosis not present

## 2018-09-08 DIAGNOSIS — M25611 Stiffness of right shoulder, not elsewhere classified: Secondary | ICD-10-CM | POA: Diagnosis not present

## 2018-09-08 DIAGNOSIS — R29818 Other symptoms and signs involving the nervous system: Secondary | ICD-10-CM

## 2018-09-08 DIAGNOSIS — M25612 Stiffness of left shoulder, not elsewhere classified: Secondary | ICD-10-CM

## 2018-09-08 DIAGNOSIS — R2689 Other abnormalities of gait and mobility: Secondary | ICD-10-CM | POA: Diagnosis not present

## 2018-09-08 DIAGNOSIS — R29898 Other symptoms and signs involving the musculoskeletal system: Secondary | ICD-10-CM | POA: Diagnosis not present

## 2018-09-08 DIAGNOSIS — R41840 Attention and concentration deficit: Secondary | ICD-10-CM

## 2018-09-08 DIAGNOSIS — R293 Abnormal posture: Secondary | ICD-10-CM

## 2018-09-08 NOTE — Telephone Encounter (Signed)
  Dr. Rexene Alberts,   Ronald Noble was seen for occupational therapy evaluation this week.  He reports  acceleration in gait with difficulty stopping and falls since last PT screen in March and would benefit from physical therapy evaluation due to this change.  If you agree, please send order for physical therapy via epic.   Thank you,  Vianne Bulls, OTR/L [facility] [address] [place], [place]  [address] [phone] phone [phone] 09/08/18 7:17 AM

## 2018-09-08 NOTE — Therapy (Signed)
[facility] [address] [place], [name], [address] Phone: [phone]   Fax:  [phone]  Occupational Therapy Treatment  Patient Details  Name: Ronald Noble MRN: [id number] Date of Birth: 19-Oct-1945 Referring Provider (OT): Dr. Star Age   Encounter Date: 09/08/2018  OT End of Session - 09/08/18 0711    Visit Number  2    Number of Visits  17    Date for OT Re-Evaluation  11/04/18    Authorization Type  Medicare & BCBS, covered 100%    Authorization Time Period  cert period 06/11/83-8/85/02    Authorization - Visit Number  2    Authorization - Number of Visits  10    OT Start Time  0706    OT Stop Time  0748    OT Time Calculation (min)  42 min    Activity Tolerance  Patient tolerated treatment well    Behavior During Therapy  [facility] for tasks assessed/performed       Past Medical History:  Diagnosis Date  . BPH (benign prostatic hyperplasia)   . Parkinson disease ([place])   . Skin cancer of face    arms and back  . Sleep apnea    mild  . Spider bite    brown recluse/ left ankle/08/13    Past Surgical History:  Procedure Laterality Date  . BURR HOLE W/ STEREOTACTIC INSERTION OF DBS LEADS / INTRAOP MICROELECTRODE RECORDING  04/2014  . COLONOSCOPY    . KNEE SURGERY     exploratory/left  . SKIN CANCER EXCISION  01/2014   face  . TRANSURETHRAL RESECTION OF PROSTATE     2008    There were no vitals filed for this visit.  Subjective Assessment - 09/08/18 0710    Subjective   back aches, no falls.  Pt has procedure to change DBS battery 6/19 (will need to cancel appts the week prior due to Covid-19 testing)    Pertinent History  PD diagnosed in 2008, bilateral DBS 2016    Limitations  fall risk, bilateral DBS    Patient Stated Goals  improve coordination    Currently in Pain?  No/denies        CLINIC OPERATION CHANGES: Outpatient Neuro Rehab is open at lower capacity following universal  masking, social distancing, and patient screening.  The patient's COVID risk of complications score is 3.   In supine, AAROM shoulder flex and diagonals with BUEs with ball with min cueing for stretch.  PWR! Rock in supine with min cueing for incr movement amplitude.  In prone, PWR! Twist with min v.c for positioning  In quadruped, PWR! Rock and twist with min v.c. for stretch and large amplitude  In sitting, PWR! Up with cueing for midline alignment and rock with min v.c for incr movement amplitude.  Assessed 3 button fasten/unfasten and established goal.  In sitting, functional reaching to place clothespins on vertical pole with RUE incorporating trunk rotation and lateral wt. Shift to the L for trunk elongation and improved midline alignment with min cueing for L shoulder depression/avoid elevation with wt. Shift.   Discussed upcoming procedure for DBS battery replacement (outpatient procedure).  Will need to cancel therapy 7 days prior due to Covid-19 testing procedure.  Requested pt obtain clarification from MD regarding any restrictions he may have after procedure for ADLs/therapy.  Pt verbalized understanding.       OT Education - 09/08/18 0756    Education Details  Recommended pt perform sitting  PWR! up/rock, supine PWR! rock, prone PWR! twist, and quadruped PWR! twist, rock in mornings for stretch    Person(s) Educated  Patient    Methods  Explanation;Demonstration;Verbal cues    Comprehension  Verbalized understanding;Returned demonstration;Verbal cues required       OT Short Term Goals - 09/08/18 0740      OT SHORT TERM GOAL #1   Title  Pt will be independent with updated PD specific HEP--check STGs 10/05/18    Time  4    Period  Weeks    Status  New      OT SHORT TERM GOAL #2   Title  Pt will be able to sustain upright posture for at least 5 min during seated functional task for incr ease using mouse and incr safety for driving.    Baseline  leans to the R and uses  RUE support    Time  4    Period  Weeks    Status  New      OT SHORT TERM GOAL #3   Title  Pt will demo improved coordination/functional reaching for ADLs as shown by improving score on box and blocks test by at least 3 bilaterally.    Baseline  R-49 blocks, L-43 blocks    Time  4    Period  Weeks    Status  New      OT SHORT TERM GOAL #4   Title  Pt will be able to fasten/unfasten 3 buttons in 55sec or less for improved bilateral hand coordination/incr ease with dressing.    Baseline  64.34sec    Time  4    Period  Weeks    Status  New      OT SHORT TERM GOAL #5   Title  -----    Baseline  ---        OT Long Term Goals - 09/05/18 1131      OT LONG TERM GOAL #1   Title  Pt will verbalize understanding of adaptive strategies to incr safety/ease with ADL's/IADLS's--LTGs due 3/76/28    Time  8    Period  Weeks    Status  New      OT LONG TERM GOAL #2   Title  Pt will improve bilateral functional reach and balance as shown by improving standing functional reach by at least 2" bilaterally.    Baseline  RUE 7.5, LUE 8.5    Time  8    Period  Weeks    Status  New   11 inches     OT LONG TERM GOAL #3   Title  Pt will be able to sustain upright posture for at least 8 min during seated functional task for incr ease using mouse and incr safety for driving.      OT LONG TERM GOAL #4   Title  Pt will demo improved coordination/functional reaching for ADLs as shown by improving score on box and blocks test by at least 6 bilaterally.    Baseline  R-49, L-43 blocks    Time  8    Period  Weeks    Status  New   135 shoulder flexion, -5     OT LONG TERM GOAL #5   Title  ------            Plan - 09/08/18 0719    Clinical Impression Statement  Pt demo improvement in coordination after stretching and work on midline alignment today.  Pt reports that  he is scheduled for his DBS battery replacement 6/19 and will need to cancel the week of the procedure due to Covid-19 testing      OT Occupational Profile and History  Detailed Assessment- Review of Records and additional review of physical, cognitive, psychosocial history related to current functional performance    Occupational Profile and client history currently impacting functional performance  Pt works full time as an Forensic psychologist, he reports slowing of ADLs, incr difficulty with gait/falls, and leaning to R side.  PMH: PD diagnosed in 2008, s/p bilateral DBS, skin cancer, sleep apnea, BPH, TURP, orthostatic hypotension, syncopal episodes; arthritic changes in spine with hx of back pain    Occupational performance deficits (Please refer to evaluation for details):  ADL's;IADL's;Work;Leisure;Social Participation    Body Structure / Function / Physical Skills  ADL;Coordination;Endurance;UE functional use;GMC;Decreased knowledge of precautions;Balance;Decreased knowledge of use of DME;Flexibility;IADL;Pain;FMC;Dexterity;Mobility;Gait;ROM;Tone   bradykinesia   Cognitive Skills  Attention;Safety Awareness;Memory    Rehab Potential  Good    Clinical Decision Making  Several treatment options, min-mod task modification necessary    Comorbidities Affecting Occupational Performance:  May have comorbidities impacting occupational performance    Modification or Assistance to Complete Evaluation   Min-Moderate modification of tasks or assist with assess necessary to complete eval    OT Frequency  2x / week    OT Duration  8 weeks    OT Treatment/Interventions  Self-care/ADL training;Therapeutic exercise;Patient/family education;Moist Heat;Energy conservation;Therapist, nutritional;Therapeutic activities;Balance training;Cognitive remediation/compensation;Passive range of motion;Manual Therapy;DME and/or AE instruction;Cryotherapy;Fluidtherapy;Aquatic Therapy;Neuromuscular education    Plan  work on posture and shoulder ROM, functional reach, coordination    Recommended Other Services  physical therapy due to falls/changes in gait     Consulted and Agree with Plan of Care  Patient       Patient will benefit from skilled therapeutic intervention in order to improve the following deficits and impairments:  Body Structure / Function / Physical Skills, Cognitive Skills, Psychosocial Skills  Visit Diagnosis: Other symptoms and signs involving the nervous system  Other symptoms and signs involving the musculoskeletal system  Other lack of coordination  Other abnormalities of gait and mobility  Stiffness of right shoulder, not elsewhere classified  Stiffness of left shoulder, not elsewhere classified  Abnormal posture  Unsteadiness on feet  Attention and concentration deficit    Problem List Patient Active Problem List   Diagnosis Date Noted  . Parkinson disease [facility])     [facility] 09/08/2018, 8:13 AM  [facility] [address] [place], [name], [address] Phone: [phone]   Fax:  [phone]  Name: Ronald Noble MRN: [id number] Date of Birth: 11-21-1945   Vianne Bulls, OTR/L [facility] [address] [place], [place]  [address] [phone] phone [phone] 09/08/18 8:13 AM

## 2018-09-08 NOTE — Telephone Encounter (Signed)
Referral placed for PT eval and treat as per neuro rehab recs.

## 2018-09-13 ENCOUNTER — Ambulatory Visit: Payer: Medicare Other | Admitting: Occupational Therapy

## 2018-09-13 ENCOUNTER — Other Ambulatory Visit: Payer: Self-pay

## 2018-09-13 DIAGNOSIS — R293 Abnormal posture: Secondary | ICD-10-CM

## 2018-09-13 DIAGNOSIS — R278 Other lack of coordination: Secondary | ICD-10-CM

## 2018-09-13 DIAGNOSIS — R29818 Other symptoms and signs involving the nervous system: Secondary | ICD-10-CM

## 2018-09-13 DIAGNOSIS — M25611 Stiffness of right shoulder, not elsewhere classified: Secondary | ICD-10-CM | POA: Diagnosis not present

## 2018-09-13 DIAGNOSIS — R29898 Other symptoms and signs involving the musculoskeletal system: Secondary | ICD-10-CM | POA: Diagnosis not present

## 2018-09-13 DIAGNOSIS — R2689 Other abnormalities of gait and mobility: Secondary | ICD-10-CM

## 2018-09-13 DIAGNOSIS — R2681 Unsteadiness on feet: Secondary | ICD-10-CM

## 2018-09-13 DIAGNOSIS — M25612 Stiffness of left shoulder, not elsewhere classified: Secondary | ICD-10-CM | POA: Diagnosis not present

## 2018-09-13 DIAGNOSIS — R41840 Attention and concentration deficit: Secondary | ICD-10-CM

## 2018-09-13 NOTE — Therapy (Signed)
[facility] [address] [place], [name], [address] Phone: [phone]   Fax:  [phone]  Occupational Therapy Treatment  Patient Details  Name: Ronald Noble MRN: [id number] Date of Birth: 10-22-1945 Referring Provider (OT): Dr. Star Age   Encounter Date: 09/13/2018  OT End of Session - 09/13/18 0715    Visit Number  3    Number of Visits  17    Date for OT Re-Evaluation  11/04/18    Authorization Type  Medicare & BCBS, covered 100%    Authorization Time Period  cert period 04/10/92-5/85/92    Authorization - Visit Number  3    Authorization - Number of Visits  10    OT Start Time  0708    OT Stop Time  0748    OT Time Calculation (min)  40 min    Activity Tolerance  Patient tolerated treatment well    Behavior During Therapy  [facility] for tasks assessed/performed       Past Medical History:  Diagnosis Date  . BPH (benign prostatic hyperplasia)   . Parkinson disease ([place])   . Skin cancer of face    arms and back  . Sleep apnea    mild  . Spider bite    brown recluse/ left ankle/08/13    Past Surgical History:  Procedure Laterality Date  . BURR HOLE W/ STEREOTACTIC INSERTION OF DBS LEADS / INTRAOP MICROELECTRODE RECORDING  04/2014  . COLONOSCOPY    . KNEE SURGERY     exploratory/left  . SKIN CANCER EXCISION  01/2014   face  . TRANSURETHRAL RESECTION OF PROSTATE     2008    There were no vitals filed for this visit.  Subjective Assessment - 09/13/18 0715    Pertinent History  PD diagnosed in 2008, bilateral DBS 2016    Limitations  fall risk, bilateral DBS    Patient Stated Goals  improve coordination    Currently in Pain?  No/denies              CLINIC OPERATION CHANGES: Outpatient Neuro Rehab is open at lower capacity following universal masking, social distancing, and patient screening.  The patient's COVID risk of complications score is 3 Supine PWR! Up and rock, min v.c  followed by Karn Cassis! Rock, then standing for [employer]! up and rock, 10 reps each, min v.c for upright posture, and larger amplitude movements. Dynamic functional reaching in seated and standing with trunk rotation, min v.c for posture and larger amplitude movements. Seated dynamic functional reaching to place and remove small pegs  from vertical pegboard, min difficulty/ v.c                OT Short Term Goals - 09/08/18 0740      OT SHORT TERM GOAL #1   Title  Pt will be independent with updated PD specific HEP--check STGs 10/05/18    Time  4    Period  Weeks    Status  New      OT SHORT TERM GOAL #2   Title  Pt will be able to sustain upright posture for at least 5 min during seated functional task for incr ease using mouse and incr safety for driving.    Baseline  leans to the R and uses RUE support    Time  4    Period  Weeks    Status  New      OT SHORT TERM GOAL #3  Title  Pt will demo improved coordination/functional reaching for ADLs as shown by improving score on box and blocks test by at least 3 bilaterally.    Baseline  R-49 blocks, L-43 blocks    Time  4    Period  Weeks    Status  New      OT SHORT TERM GOAL #4   Title  Pt will be able to fasten/unfasten 3 buttons in 55sec or less for improved bilateral hand coordination/incr ease with dressing.    Baseline  64.34sec    Time  4    Period  Weeks    Status  New      OT SHORT TERM GOAL #5   Title  -----    Baseline  ---        OT Long Term Goals - 09/05/18 1131      OT LONG TERM GOAL #1   Title  Pt will verbalize understanding of adaptive strategies to incr safety/ease with ADL's/IADLS's--LTGs due 5/36/64    Time  8    Period  Weeks    Status  New      OT LONG TERM GOAL #2   Title  Pt will improve bilateral functional reach and balance as shown by improving standing functional reach by at least 2" bilaterally.    Baseline  RUE 7.5, LUE 8.5    Time  8    Period  Weeks    Status  New   11  inches     OT LONG TERM GOAL #3   Title  Pt will be able to sustain upright posture for at least 8 min during seated functional task for incr ease using mouse and incr safety for driving.      OT LONG TERM GOAL #4   Title  Pt will demo improved coordination/functional reaching for ADLs as shown by improving score on box and blocks test by at least 6 bilaterally.    Baseline  R-49, L-43 blocks    Time  8    Period  Weeks    Status  New   135 shoulder flexion, -5     OT LONG TERM GOAL #5   Title  ------            Plan - 09/13/18 4034    Clinical Impression Statement  Pt is progressing towards goals. He responds well to v.c for upright posture and midline orientation.    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  PMH: PD diagnosed in 2008, s/p bilateral DBS, skin cancer, sleep apnea, BPH, TURP, orthostatic hypotension, syncopal episodes; arthritic changes in spine with hx of back pain    Occupational performance deficits (Please refer to evaluation for details):  ADL's;IADL's;Work;Leisure;Social Participation    Body Structure / Function / Physical Skills  ADL;Coordination;Endurance;UE functional use;GMC;Decreased knowledge of precautions;Balance;Decreased knowledge of use of DME;Flexibility;IADL;Pain;FMC;Dexterity;Mobility;Gait;ROM;Tone    Cognitive Skills  Attention;Safety Awareness;Memory    Rehab Potential  Good    OT Frequency  2x / week    OT Duration  8 weeks    OT Treatment/Interventions  Self-care/ADL training;Therapeutic exercise;Patient/family education;Moist Heat;Energy conservation;Therapist, nutritional;Therapeutic activities;Balance training;Cognitive remediation/compensation;Passive range of motion;Manual Therapy;DME and/or AE instruction;Cryotherapy;Fluidtherapy;Aquatic Therapy;Neuromuscular education     Plan  work on posture and shoulder ROM, functional reach, coordination    Recommended Other Services  physical therapy due  to falls/changes in gait    Consulted and Agree with Plan of Care  Patient       Patient will benefit from skilled therapeutic intervention in order to improve the following deficits and impairments:  Body Structure / Function / Physical Skills, Cognitive Skills, Psychosocial Skills  Visit Diagnosis: Other symptoms and signs involving the nervous system  Other symptoms and signs involving the musculoskeletal system  Other lack of coordination  Other abnormalities of gait and mobility  Stiffness of right shoulder, not elsewhere classified  Stiffness of left shoulder, not elsewhere classified  Abnormal posture  Attention and concentration deficit  Unsteadiness on feet    Problem List Patient Active Problem List   Diagnosis Date Noted  . Parkinson disease ([place])     , 09/13/2018, 7:46 AM  [facility] [address] [place], [name], [address] Phone: [phone]   Fax:  [phone]  Name: Ronald Noble MRN: [id number] Date of Birth: 12-Sep-1945

## 2018-09-13 NOTE — Telephone Encounter (Signed)
Noted PY referral has been sent .

## 2018-09-15 ENCOUNTER — Ambulatory Visit: Payer: Medicare Other | Admitting: Occupational Therapy

## 2018-09-15 ENCOUNTER — Other Ambulatory Visit: Payer: Self-pay

## 2018-09-15 DIAGNOSIS — M25611 Stiffness of right shoulder, not elsewhere classified: Secondary | ICD-10-CM

## 2018-09-15 DIAGNOSIS — R2689 Other abnormalities of gait and mobility: Secondary | ICD-10-CM | POA: Diagnosis not present

## 2018-09-15 DIAGNOSIS — R41840 Attention and concentration deficit: Secondary | ICD-10-CM

## 2018-09-15 DIAGNOSIS — M25612 Stiffness of left shoulder, not elsewhere classified: Secondary | ICD-10-CM

## 2018-09-15 DIAGNOSIS — R278 Other lack of coordination: Secondary | ICD-10-CM | POA: Diagnosis not present

## 2018-09-15 DIAGNOSIS — R2681 Unsteadiness on feet: Secondary | ICD-10-CM

## 2018-09-15 DIAGNOSIS — R29818 Other symptoms and signs involving the nervous system: Secondary | ICD-10-CM | POA: Diagnosis not present

## 2018-09-15 DIAGNOSIS — R29898 Other symptoms and signs involving the musculoskeletal system: Secondary | ICD-10-CM | POA: Diagnosis not present

## 2018-09-15 DIAGNOSIS — R293 Abnormal posture: Secondary | ICD-10-CM

## 2018-09-15 NOTE — Therapy (Signed)
[facility] [address] [place], [name], [address] Phone: [phone]   Fax:  [phone]  Occupational Therapy Treatment  Patient Details  Name: Ronald Noble MRN: [id number] Date of Birth: [birth year] Referring Provider (OT): Dr. Star Age   Encounter Date: 09/15/2018  OT End of Session - 09/15/18 0728    Visit Number  3    Number of Visits  17    Date for OT Re-Evaluation  11/04/18    Authorization Type  Medicare & BCBS, covered 100%    Authorization Time Period  cert period 5/0/93-2/67/12    Authorization - Visit Number  3    Authorization - Number of Visits  10    OT Start Time  0705    OT Stop Time  0750    OT Time Calculation (min)  45 min    Activity Tolerance  Patient tolerated treatment well    Behavior During Therapy  [facility] for tasks assessed/performed       Past Medical History:  Diagnosis Date  . BPH (benign prostatic hyperplasia)   . Parkinson disease ([place])   . Skin cancer of face    arms and back  . Sleep apnea    mild  . Spider bite    brown recluse/ left ankle/08/13    Past Surgical History:  Procedure Laterality Date  . BURR HOLE W/ STEREOTACTIC INSERTION OF DBS LEADS / INTRAOP MICROELECTRODE RECORDING  04/2014  . COLONOSCOPY    . KNEE SURGERY     exploratory/left  . SKIN CANCER EXCISION  01/2014   face  . TRANSURETHRAL RESECTION OF PROSTATE     2008    There were no vitals filed for this visit.  Subjective Assessment - 09/15/18 0726    Pertinent History  PD diagnosed in 2008, bilateral DBS 2016    Limitations  fall risk, bilateral DBS    Patient Stated Goals  improve coordination    Currently in Pain?  Yes    Pain Score  4     Pain Location  Hip    Pain Orientation  Left    Pain Descriptors / Indicators  Sharp;Dull    Pain Type  Chronic pain    Pain Onset  More than a month ago    Pain Frequency  Intermittent    Aggravating Factors   standing    Pain Relieving  Factors  stretching        CLINIC OPERATION CHANGES: Outpatient Neuro Rehab is open at lower capacity following universal masking, social distancing, and patient screening.  The patient's COVID risk of complications score is 3.   In supine, AAROM shoulder flex and diagonals with BUEs with ball with min cueing for stretch.  PWR! Rock in supine with min cueing for incr movement amplitude.  In prone, PWR! Twist with min v.c for positioning  In sitting, PWR! Up with cueing for midline alignment with min v.c for incr movement amplitude.  Educated pt on importance of reinforcing small movement patterns and reinforced that he should avoid jogging for this reason.  Also educated pt on importance of stopping, correcting posture and re-starting movements big.  Recommended pt stop periodically when walking to re-set posture (every power pole for example).  Pt instructed to focus on posture and deliberate heel strike and step size and avoid trying to speed up.  Ambulation with focus/min cues on posture (pt leans forward and to the right) and deliberate step with stops with feet apart,  then re-set posture, before further ambulation.  Pt with good response to cueing and no acceleration noted.  Sit>stand with min cues for large amplitude movement technique including forward lean and feet apart.  Recommended 2-3 PWR! Moves in sitting every 20-30 min when working at desk for incr posture.  Pt educated in how dystonia can contribute to pain.             OT Short Term Goals - 09/08/18 0740      OT SHORT TERM GOAL #1   Title  Pt will be independent with updated PD specific HEP--check STGs 10/05/18    Time  4    Period  Weeks    Status  New      OT SHORT TERM GOAL #2   Title  Pt will be able to sustain upright posture for at least 5 min during seated functional task for incr ease using mouse and incr safety for driving.    Baseline  leans to the R and uses RUE support    Time  4    Period  Weeks     Status  New      OT SHORT TERM GOAL #3   Title  Pt will demo improved coordination/functional reaching for ADLs as shown by improving score on box and blocks test by at least 3 bilaterally.    Baseline  R-49 blocks, L-43 blocks    Time  4    Period  Weeks    Status  New      OT SHORT TERM GOAL #4   Title  Pt will be able to fasten/unfasten 3 buttons in 55sec or less for improved bilateral hand coordination/incr ease with dressing.    Baseline  64.34sec    Time  4    Period  Weeks    Status  New      OT SHORT TERM GOAL #5   Title  -----    Baseline  ---        OT Long Term Goals - 09/05/18 1131      OT LONG TERM GOAL #1   Title  Pt will verbalize understanding of adaptive strategies to incr safety/ease with ADL's/IADLS's--LTGs due 8/52/77    Time  8    Period  Weeks    Status  New      OT LONG TERM GOAL #2   Title  Pt will improve bilateral functional reach and balance as shown by improving standing functional reach by at least 2" bilaterally.    Baseline  RUE 7.5, LUE 8.5    Time  8    Period  Weeks    Status  New   11 inches     OT LONG TERM GOAL #3   Title  Pt will be able to sustain upright posture for at least 8 min during seated functional task for incr ease using mouse and incr safety for driving.      OT LONG TERM GOAL #4   Title  Pt will demo improved coordination/functional reaching for ADLs as shown by improving score on box and blocks test by at least 6 bilaterally.    Baseline  R-49, L-43 blocks    Time  8    Period  Weeks    Status  New   135 shoulder flexion, -5     OT LONG TERM GOAL #5   Title  ------            Plan - 09/15/18  0800    Clinical Impression Statement  Pt is progressing with improving posture during sessions.  Pt responds well to cueing for posture with exercise, but continues to need consistent cues for functional activities.    Occupational Profile and client history currently impacting functional performance  Pt works  full time as an Forensic psychologist, he reports slowing of ADLs, incr difficulty with gait/falls, and leaning to R side.  PMH: PD diagnosed in 2008, s/p bilateral DBS, skin cancer, sleep apnea, BPH, TURP, orthostatic hypotension, syncopal episodes; arthritic changes in spine with hx of back pain    Occupational performance deficits (Please refer to evaluation for details):  ADL's;IADL's;Work;Leisure;Social Participation    Body Structure / Function / Physical Skills  ADL;Coordination;Endurance;UE functional use;GMC;Decreased knowledge of precautions;Balance;Decreased knowledge of use of DME;Flexibility;IADL;Pain;FMC;Dexterity;Mobility;Gait;ROM;Tone    Cognitive Skills  Attention;Safety Awareness;Memory    Rehab Potential  Good    OT Frequency  2x / week    OT Duration  8 weeks    OT Treatment/Interventions  Self-care/ADL training;Therapeutic exercise;Patient/family education;Moist Heat;Energy conservation;Therapist, nutritional;Therapeutic activities;Balance training;Cognitive remediation/compensation;Passive range of motion;Manual Therapy;DME and/or AE instruction;Cryotherapy;Fluidtherapy;Aquatic Therapy;Neuromuscular education    Plan  work on posture and shoulder ROM, functional reach, coordination    Recommended Other Services  physical therapy due to falls/changes in gait    Consulted and Agree with Plan of Care  Patient       Patient will benefit from skilled therapeutic intervention in order to improve the following deficits and impairments:  Body Structure / Function / Physical Skills, Cognitive Skills, Psychosocial Skills  Visit Diagnosis: Other symptoms and signs involving the nervous system  Other symptoms and signs involving the musculoskeletal system  Other lack of coordination  Other abnormalities of gait and mobility  Stiffness of right shoulder, not elsewhere classified  Stiffness of left shoulder, not elsewhere classified  Abnormal posture  Attention and concentration  deficit  Unsteadiness on feet    Problem List Patient Active Problem List   Diagnosis Date Noted  . Parkinson disease ([place])     [facility] 09/15/2018, 8:02 AM  [facility] [address] [place], [name], [address] Phone: [phone]   Fax:  [phone]  Name: Ronald Noble MRN: [id number] Date of Birth: [birth year]   Vianne Bulls, OTR/L [facility] [address] [place], [place]  [phone] phone [phone] 09/15/18 8:02 AM

## 2018-09-16 DIAGNOSIS — Z01812 Encounter for preprocedural laboratory examination: Secondary | ICD-10-CM | POA: Diagnosis not present

## 2018-09-16 DIAGNOSIS — G20 Parkinson's disease: Secondary | ICD-10-CM | POA: Diagnosis not present

## 2018-09-16 DIAGNOSIS — Z1159 Encounter for screening for other viral diseases: Secondary | ICD-10-CM | POA: Diagnosis not present

## 2018-09-19 ENCOUNTER — Ambulatory Visit: Payer: Medicare Other | Admitting: Occupational Therapy

## 2018-09-21 ENCOUNTER — Encounter: Payer: Medicare Other | Admitting: Occupational Therapy

## 2018-09-23 DIAGNOSIS — T85190A Other mechanical complication of implanted electronic neurostimulator (electrode) of brain, initial encounter: Secondary | ICD-10-CM | POA: Diagnosis not present

## 2018-09-23 DIAGNOSIS — Z4542 Encounter for adjustment and management of neuropacemaker (brain) (peripheral nerve) (spinal cord): Secondary | ICD-10-CM | POA: Diagnosis not present

## 2018-09-23 DIAGNOSIS — N400 Enlarged prostate without lower urinary tract symptoms: Secondary | ICD-10-CM | POA: Diagnosis not present

## 2018-09-23 DIAGNOSIS — T85113A Breakdown (mechanical) of implanted electronic neurostimulator, generator, initial encounter: Secondary | ICD-10-CM | POA: Diagnosis not present

## 2018-09-23 DIAGNOSIS — G4733 Obstructive sleep apnea (adult) (pediatric): Secondary | ICD-10-CM | POA: Diagnosis not present

## 2018-09-23 DIAGNOSIS — G20 Parkinson's disease: Secondary | ICD-10-CM | POA: Diagnosis not present

## 2018-09-23 DIAGNOSIS — G4700 Insomnia, unspecified: Secondary | ICD-10-CM | POA: Diagnosis not present

## 2018-09-28 ENCOUNTER — Ambulatory Visit: Payer: Medicare Other | Admitting: Occupational Therapy

## 2018-09-28 DIAGNOSIS — G20 Parkinson's disease: Secondary | ICD-10-CM | POA: Diagnosis not present

## 2018-09-28 DIAGNOSIS — G245 Blepharospasm: Secondary | ICD-10-CM | POA: Diagnosis not present

## 2018-09-28 DIAGNOSIS — Z9682 Presence of neurostimulator: Secondary | ICD-10-CM | POA: Diagnosis not present

## 2018-09-29 ENCOUNTER — Ambulatory Visit: Payer: Medicare Other | Admitting: Physical Therapy

## 2018-09-30 ENCOUNTER — Ambulatory Visit: Payer: Medicare Other | Admitting: Physical Therapy

## 2018-10-05 ENCOUNTER — Ambulatory Visit: Payer: Medicare Other | Admitting: Occupational Therapy

## 2018-10-05 ENCOUNTER — Ambulatory Visit: Payer: Medicare Other | Admitting: Physical Therapy

## 2018-10-05 DIAGNOSIS — G20 Parkinson's disease: Secondary | ICD-10-CM | POA: Diagnosis not present

## 2018-10-11 ENCOUNTER — Ambulatory Visit: Payer: Medicare Other | Admitting: Occupational Therapy

## 2018-10-13 ENCOUNTER — Ambulatory Visit: Payer: Medicare Other | Admitting: Physical Therapy

## 2018-10-13 ENCOUNTER — Encounter: Payer: Medicare Other | Admitting: Occupational Therapy

## 2018-10-14 ENCOUNTER — Ambulatory Visit: Payer: Medicare Other | Admitting: Physical Therapy

## 2018-10-14 ENCOUNTER — Ambulatory Visit: Payer: Medicare Other | Admitting: Occupational Therapy

## 2018-10-18 ENCOUNTER — Ambulatory Visit: Payer: Medicare Other | Admitting: Physical Therapy

## 2018-10-18 ENCOUNTER — Ambulatory Visit: Payer: Medicare Other | Attending: Internal Medicine | Admitting: Occupational Therapy

## 2018-10-18 ENCOUNTER — Other Ambulatory Visit: Payer: Self-pay

## 2018-10-18 DIAGNOSIS — M25611 Stiffness of right shoulder, not elsewhere classified: Secondary | ICD-10-CM | POA: Insufficient documentation

## 2018-10-18 DIAGNOSIS — R29818 Other symptoms and signs involving the nervous system: Secondary | ICD-10-CM | POA: Diagnosis not present

## 2018-10-18 DIAGNOSIS — R278 Other lack of coordination: Secondary | ICD-10-CM | POA: Diagnosis not present

## 2018-10-18 DIAGNOSIS — R2689 Other abnormalities of gait and mobility: Secondary | ICD-10-CM | POA: Diagnosis not present

## 2018-10-18 DIAGNOSIS — M25612 Stiffness of left shoulder, not elsewhere classified: Secondary | ICD-10-CM | POA: Insufficient documentation

## 2018-10-18 DIAGNOSIS — R2681 Unsteadiness on feet: Secondary | ICD-10-CM | POA: Insufficient documentation

## 2018-10-18 DIAGNOSIS — R293 Abnormal posture: Secondary | ICD-10-CM | POA: Diagnosis not present

## 2018-10-18 DIAGNOSIS — Z9181 History of falling: Secondary | ICD-10-CM | POA: Diagnosis not present

## 2018-10-18 DIAGNOSIS — M6281 Muscle weakness (generalized): Secondary | ICD-10-CM | POA: Diagnosis not present

## 2018-10-18 DIAGNOSIS — R41840 Attention and concentration deficit: Secondary | ICD-10-CM | POA: Insufficient documentation

## 2018-10-18 DIAGNOSIS — R29898 Other symptoms and signs involving the musculoskeletal system: Secondary | ICD-10-CM | POA: Diagnosis not present

## 2018-10-18 NOTE — Therapy (Signed)
[facility] [address] [place], [name], [address] Phone: [phone]   Fax:  [phone]  Occupational Therapy Treatment  Patient Details  Name: Ronald Noble MRN: [id number] Date of Birth: 1945-07-27 Referring Provider (OT): Dr. Star Age   Encounter Date: 10/18/2018  OT End of Session - 10/18/18 0713    Visit Number  5   corrected count   Number of Visits  17    Date for OT Re-Evaluation  11/04/18    Authorization Type  Medicare & BCBS, covered 100%    Authorization Time Period  cert period 06/07/89-7/91/50    Authorization - Visit Number  5    Authorization - Number of Visits  10    OT Start Time  0704    OT Stop Time  0745    OT Time Calculation (min)  41 min    Activity Tolerance  Patient tolerated treatment well    Behavior During Therapy  [facility] for tasks assessed/performed       Past Medical History:  Diagnosis Date  . BPH (benign prostatic hyperplasia)   . Parkinson disease ([place])   . Skin cancer of face    arms and back  . Sleep apnea    mild  . Spider bite    brown recluse/ left ankle/08/13    Past Surgical History:  Procedure Laterality Date  . BURR HOLE W/ STEREOTACTIC INSERTION OF DBS LEADS / INTRAOP MICROELECTRODE RECORDING  04/2014  . COLONOSCOPY    . KNEE SURGERY     exploratory/left  . SKIN CANCER EXCISION  01/2014   face  . TRANSURETHRAL RESECTION OF PROSTATE     2008    There were no vitals filed for this visit.  Subjective Assessment - 10/18/18 0713    Pertinent History  PD diagnosed in 2008, bilateral DBS 2016    Limitations  fall risk, bilateral DBS    Patient Stated Goals  improve coordination    Currently in Pain?  No/denies    Pain Onset  More than a month ago            CLINIC OPERATION CHANGES: Outpatient Neuro Rehab is open at lower capacity following universal masking, social distancing, and patient screening.  The patient's COVID risk of  complications score is 3 Supine PWR! Up and rock, min v.c followed by Karn Cassis! Rock, and twist then standing for [employer]! rock, prone 10 reps each, min v.c for upright posture, and larger amplitude movements. Reviewed PWR! hands followed by review of coordination HEP activities: flipping and dealing cards, stacking and manipulating coins, min v.c and demonstration for larger amplitude movements Discussion regarding avoiding stomach crunches as they increase flexion, and that jogging is not a good activity due to risk for falls. Pt was encouraged to focus primarily on PWR! Exercises, and coordination activities.                OT Short Term Goals - 09/08/18 0740      OT SHORT TERM GOAL #1   Title  Pt will be independent with updated PD specific HEP--check STGs 10/05/18    Time  4    Period  Weeks    Status  New      OT SHORT TERM GOAL #2   Title  Pt will be able to sustain upright posture for at least 5 min during seated functional task for incr ease using mouse and incr safety for driving.    Baseline  leans to the R and uses RUE support    Time  4    Period  Weeks    Status  New      OT SHORT TERM GOAL #3   Title  Pt will demo improved coordination/functional reaching for ADLs as shown by improving score on box and blocks test by at least 3 bilaterally.    Baseline  R-49 blocks, L-43 blocks    Time  4    Period  Weeks    Status  New      OT SHORT TERM GOAL #4   Title  Pt will be able to fasten/unfasten 3 buttons in 55sec or less for improved bilateral hand coordination/incr ease with dressing.    Baseline  64.34sec    Time  4    Period  Weeks    Status  New      OT SHORT TERM GOAL #5   Title  -----    Baseline  ---        OT Long Term Goals - 09/05/18 1131      OT LONG TERM GOAL #1   Title  Pt will verbalize understanding of adaptive strategies to incr safety/ease with ADL's/IADLS's--LTGs due 1/49/70    Time  8    Period  Weeks    Status  New      OT  LONG TERM GOAL #2   Title  Pt will improve bilateral functional reach and balance as shown by improving standing functional reach by at least 2" bilaterally.    Baseline  RUE 7.5, LUE 8.5    Time  8    Period  Weeks    Status  New   11 inches     OT LONG TERM GOAL #3   Title  Pt will be able to sustain upright posture for at least 8 min during seated functional task for incr ease using mouse and incr safety for driving.      OT LONG TERM GOAL #4   Title  Pt will demo improved coordination/functional reaching for ADLs as shown by improving score on box and blocks test by at least 6 bilaterally.    Baseline  R-49, L-43 blocks    Time  8    Period  Weeks    Status  New   135 shoulder flexion, -5     OT LONG TERM GOAL #5   Title  ------            Plan - 10/18/18 2637    Clinical Impression Statement  Pt is progressing towards goals. Pt has not been seen for several weeks as he has his DBS adjusted. Then pt was out of town.    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  PMH: PD diagnosed in 2008, s/p bilateral DBS, skin cancer, sleep apnea, BPH, TURP, orthostatic hypotension, syncopal episodes; arthritic changes in spine with hx of back pain    Occupational performance deficits (Please refer to evaluation for details):  ADL's;IADL's;Work;Leisure;Social Participation    Body Structure / Function / Physical Skills  ADL;Coordination;Endurance;UE functional use;GMC;Decreased knowledge of precautions;Balance;Decreased knowledge of use of DME;Flexibility;IADL;Pain;FMC;Dexterity;Mobility;Gait;ROM;Tone    Cognitive Skills  Attention;Safety Awareness;Memory    Rehab Potential  Good    OT Frequency  2x / week    OT Duration  8 weeks    OT  Treatment/Interventions  Self-care/ADL training;Therapeutic exercise;Patient/family  education;Moist Heat;Energy conservation;Therapist, nutritional;Therapeutic activities;Balance training;Cognitive remediation/compensation;Passive range of motion;Manual Therapy;DME and/or AE instruction;Cryotherapy;Fluidtherapy;Aquatic Therapy;Neuromuscular education    Plan  work on posture and shoulder ROM, functional reach, coordination    Recommended Other Services  physical therapy due to falls/changes in gait    Consulted and Agree with Plan of Care  Patient       Patient will benefit from skilled therapeutic intervention in order to improve the following deficits and impairments:   Body Structure / Function / Physical Skills: ADL, Coordination, Endurance, UE functional use, GMC, Decreased knowledge of precautions, Balance, Decreased knowledge of use of DME, Flexibility, IADL, Pain, FMC, Dexterity, Mobility, Gait, ROM, Tone Cognitive Skills: Attention, Safety Awareness, Memory     Visit Diagnosis: 1. Other symptoms and signs involving the musculoskeletal system   2. Other lack of coordination   3. Other symptoms and signs involving the nervous system   4. Stiffness of right shoulder, not elsewhere classified   5. Other abnormalities of gait and mobility   6. Stiffness of left shoulder, not elsewhere classified       Problem List Patient Active Problem List   Diagnosis Date Noted  . Parkinson disease ([place])     Jachelle Fluty 10/18/2018, 8:57 AM  [facility] [address] [place], [name], [address] Phone: [phone]   Fax:  [phone]  Name: Ronald Noble MRN: [id number] Date of Birth: 08-Aug-1945

## 2018-10-20 ENCOUNTER — Ambulatory Visit: Payer: Medicare Other | Admitting: Physical Therapy

## 2018-10-20 ENCOUNTER — Other Ambulatory Visit: Payer: Self-pay

## 2018-10-20 ENCOUNTER — Encounter: Payer: Self-pay | Admitting: Occupational Therapy

## 2018-10-20 ENCOUNTER — Encounter: Payer: Self-pay | Admitting: Physical Therapy

## 2018-10-20 ENCOUNTER — Ambulatory Visit: Payer: Medicare Other | Admitting: Occupational Therapy

## 2018-10-20 DIAGNOSIS — R2681 Unsteadiness on feet: Secondary | ICD-10-CM

## 2018-10-20 DIAGNOSIS — R2689 Other abnormalities of gait and mobility: Secondary | ICD-10-CM | POA: Diagnosis not present

## 2018-10-20 DIAGNOSIS — R293 Abnormal posture: Secondary | ICD-10-CM

## 2018-10-20 DIAGNOSIS — M25611 Stiffness of right shoulder, not elsewhere classified: Secondary | ICD-10-CM | POA: Diagnosis not present

## 2018-10-20 DIAGNOSIS — R29818 Other symptoms and signs involving the nervous system: Secondary | ICD-10-CM | POA: Diagnosis not present

## 2018-10-20 DIAGNOSIS — M6281 Muscle weakness (generalized): Secondary | ICD-10-CM

## 2018-10-20 DIAGNOSIS — M25612 Stiffness of left shoulder, not elsewhere classified: Secondary | ICD-10-CM | POA: Diagnosis not present

## 2018-10-20 DIAGNOSIS — R29898 Other symptoms and signs involving the musculoskeletal system: Secondary | ICD-10-CM | POA: Diagnosis not present

## 2018-10-20 DIAGNOSIS — R41840 Attention and concentration deficit: Secondary | ICD-10-CM

## 2018-10-20 DIAGNOSIS — R278 Other lack of coordination: Secondary | ICD-10-CM

## 2018-10-20 DIAGNOSIS — Z9181 History of falling: Secondary | ICD-10-CM

## 2018-10-20 NOTE — Therapy (Signed)
[facility] [address] [place], [name], [address] Phone: [phone]   Fax:  [phone]  Physical Therapy Evaluation  Patient Details  Name: Salik Grewell MRN: [id number] Date of Birth: [birth year] Referring Provider (PT): Dr. Rexene Alberts, [facility]   CLINIC OPERATION CHANGES: Outpatient Neuro Rehab is open at lower capacity following universal masking, social distancing, and patient screening.  The patient's COVID risk of complications score is 3.  Encounter Date: 10/20/2018  PT End of Session - 10/20/18 1251    Visit Number  1    Number of Visits  17    Date for PT Re-Evaluation  12/19/18    Authorization Type  Medicare & BCBS - 10th visit PN    PT Start Time  0800    PT Stop Time  0849    PT Time Calculation (min)  49 min    Activity Tolerance  Patient tolerated treatment well    Behavior During Therapy  WFL for tasks assessed/performed       Past Medical History:  Diagnosis Date  . BPH (benign prostatic hyperplasia)   . Parkinson disease ([place])   . Skin cancer of face    arms and back  . Sleep apnea    mild  . Spider bite    brown recluse/ left ankle/08/13    Past Surgical History:  Procedure Laterality Date  . BURR HOLE W/ STEREOTACTIC INSERTION OF DBS LEADS / INTRAOP MICROELECTRODE RECORDING  04/2014  . COLONOSCOPY    . KNEE SURGERY     exploratory/left  . SKIN CANCER EXCISION  01/2014   face  . TRANSURETHRAL RESECTION OF PROSTATE     2008    There were no vitals filed for this visit.   Subjective Assessment - 10/20/18 0802    Subjective  Patient reports that he has lost his ability to "brake" while walking downhill and gets too fast, was going on a jog and had to roll into the grass to stop. Since 2 months ago he is having to catch himself on furniture/nearby walls almost every day. Pt reports that these episodes happen when he hasn't got enough sleep. Had new DBS battery put in last month.     Pertinent History  PD 2008, bilateral DBS (2018)    Patient Stated Goals  wants to improve his flexibility and posture, strengthen legs, walking         [facility] PT Assessment - 10/20/18 0809      Assessment   Medical Diagnosis  Parkinson's disease    Referring Provider (PT)  Dr. Rexene Alberts, [facility]    Onset Date/Surgical Date  09/05/18    Hand Dominance  Right    Next MD Visit  --    Prior Therapy  last PT 10/2017      Precautions   Precautions  Fall    Precaution Comments  DBS      Balance Screen   Has the patient fallen in the past 6 months  Yes    How many times?  7   in one day, while out walking, couldn't brake himself   Has the patient had a decrease in activity level because of a fear of falling?   No    Is the patient reluctant to leave their home because of a fear of falling?   No      Home Social worker  Private residence    Living Arrangements  Spouse/significant other    Type  of [place] to enter    Entrance Stairs-Number of Steps  2    Entrance Stairs-Rails  Left    Home Layout  Two level   doesnt use upstairs   Home Equipment  Other (comment);Cane - quad;Walker - standard   2 Walking sticks     Prior Function   Level of Independence  Independent    Vocation  Full time employment    Vocation Requirements  attorney    Leisure  was doing yoga, weights, swimming, exercise bike prior to [employer]. during Covid is walking and does yoga classes/stretches at home.      Coordination   Gross Motor Movements are Fluid and Coordinated  --    Coordination and Movement Description  --      Posture/Postural Control   Posture/Postural Control  Postural limitations    Postural Limitations  Rounded Shoulders;Weight shift right;Flexed trunk;Forward head      Tone   Assessment Location  Right Lower Extremity;Left Lower Extremity      ROM / Strength   AROM / PROM / Strength  PROM;Strength      PROM   Overall PROM    Deficits    Overall PROM Comments  Limited knee extension PROM due to rigidity      Strength   Overall Strength  Deficits    Overall Strength Comments  Grossly 5/5 LLE, RLE 4+/5 hip flexion, 4+/5 knee flexion, grossly 5/5 remainder of LE      Ambulation/Gait   Ambulation/Gait  Yes    Ambulation/Gait Assistance  5: Supervision;6: Modified independent (Device/Increase time)    Ambulation/Gait Assistance Details  Patient requiring cues to slow gait down in order to prevent increased shuffling  and to take larger steps. Pt presents with trunk lean to R and decreased trunk rotation.      Ambulation Distance (Feet)  --   clinic distances for evaluation   Assistive device  Lofstrands;None    Gait Pattern  Decreased arm swing - right;Shuffle;Lateral trunk lean to right;Decreased trunk rotation;Trunk flexed;Narrow base of support;Decreased step length - left;Decreased step length - right    Ambulation Surface  Level;Indoor    Gait velocity  9.43 seconds = 3.48 ft/sec      Balance   Balance Assessed  Yes      Standardized Balance Assessment   Standardized Balance Assessment  Five Times Sit to Stand;Timed Up and Go Test;Mini-BESTest    Five times sit to stand comments   9.87 using BUE from arm chair, 8.53 seconds with arms crossed, lack of eccentric control, posterior LOB after last rep       Timed Up and Go Test   Normal TUG (seconds)  8.75    Manual TUG (seconds)  8.88    Cognitive TUG (seconds)  8.31    TUG Comments  from chair with arm rest, cues to  walk at normal pace      High Level Balance   High Level Balance Comments  mini BEST 20/28      RLE Tone   RLE Tone  --   Mild rigidty with knee flexion/extension     LLE Tone   LLE Tone  Within Functional Limits              Mini-BESTest: Balance Evaluation Systems Test  2005-2013 [place]. All rights reserved.  Anticipatory  Sub Score     3/6  ______________________________________________________________________ 1. SIT  TO STAND Instruction: "Cross your arms across your chest. Try not to use your hands unless you must. Do not let your legs lean against the back of the chair when you stand. Please stand up now." (2) Normal: Comes to stand without use of hands and stabilizes independently. (1) Moderate: Comes to stand WITH use of hands on first attempt. (0) Severe: Unable to stand up from chair without assistance, OR needs several attempts with use of hands. 2. RISE TO TOES Instruction: "Place your feet shoulder width apart. Place your hands on your hips. Try to rise as high as you can onto your toes. I will count out loud to 3 seconds. Try to hold this pose for at least 3 seconds. Look straight ahead. Rise now." (2) Normal: Stable for 3 s with maximum height. (1) Moderate: Heels up, but not full range (smaller than when holding hands), OR noticeable instability for 3 s. (0) Severe: < 3 s. 3. STAND ON ONE LEG Instruction: "Look straight ahead. Keep your hands on your hips. Lift your leg off of the ground behind you without touching or resting your raised leg upon your other standing leg. Stay standing on one leg as long as you can. Look straight ahead. Lift now." Left: Time in Seconds Trial 1:_2____Trial 2:__0___ (2) Normal: 20 s. (1) Moderate: < 20 s. (0) Severe: Unable. Right: Time in Seconds Trial 1:__5___Trial 2:__4___ (2) Normal: 20 s. (1) Moderate: < 20 s. (0) Severe: Unable To score each side separately use the trial with the longest time. To calculate the sub-score and total score use the side [left or right] with the lowest numerical score [i.e. the worse side].  Reactive Postural Control   Sub Score    4 /6 4. COMPENSATORY STEPPING CORRECTION- FORWARD Instruction: "Stand with your feet shoulder width apart, arms at your sides. Lean forward against my hands beyond your forward limits. When I let go, do whatever is  necessary, including taking a step, to avoid a fall." (2) Normal: Recovers independently with a single, large step (second realignment step is allowed). (1) Moderate: More than one step used to recover equilibrium. (0) Severe: No step, OR would fall if not caught, OR falls spontaneously. 5. COMPENSATORY STEPPING CORRECTION- BACKWARD Instruction: "Stand with your feet shoulder width apart, arms at your sides. Lean backward against my hands beyond your backward limits. When I let go, do whatever is necessary, including taking a step, to avoid a fall." (2) Normal: Recovers independently with a single, large step. (1) Moderate: More than one step used to recover equilibrium. (0) Severe: No step, OR would fall if not caught, OR falls spontaneously. 6. COMPENSATORY STEPPING CORRECTION- LATERAL Instruction: "Stand with your feet together, arms down at your sides. Lean into my hand beyond your sideways limit. When I let go, do whatever is necessary, including taking a step, to avoid a fall." Left (2) Normal: Recovers independently with 1 step (crossover or lateral OK). (1) Moderate: Several steps to recover equilibrium. (0) Severe: Falls, or cannot step. Right (2) Normal: Recovers independently with 1 step (crossover or lateral OK). (1) Moderate: Several steps to recover equilibrium. (0) Severe: Falls, or cannot step. Use the side with the lowest score to calculate sub-score and total score.  Sensory Orientation  Sub Score   6/6 ______________________________________________________________________ 7. STANCE (FEET TOGETHER); EYES OPEN, FIRM SURFACE Instruction: "Place your hands on your hips. Place your feet together until almost touching. Look straight ahead. Be as stable and still as possible, until I  say stop." Time in seconds:__30______ (2) Normal: 30 s. (1) Moderate: < 30 s. (0) Severe: Unable. 8. STANCE (FEET TOGETHER); EYES CLOSED, FOAM SURFACE Instruction: "Step onto the foam.  Place your hands on your hips. Place your feet together until almost touching. Be as stable and still as possible, until I say stop. I will start timing when you close your eyes." Time in seconds:____30____ (2) Normal: 30 s. (1) Moderate: < 30 s. (0) Severe: Unable. 9. INCLINE- EYES CLOSED Instruction: "Step onto the incline ramp. Please stand on the incline ramp with your toes toward the top. Place your feet shoulder width apart and have your arms down at your sides. I will start timing when you close your eyes." Time in seconds:__30______ (2) Normal: Stands independently 30 s and aligns with gravity. (1) Moderate: Stands independently <30 s OR aligns with surface. (0) Severe: Unable. ______________________________________________________________________  10. CHANGE IN GAIT SPEED  DYNAMIC GAIT SUB SCORE:  7 / 10 Instruction: "Begin walking at your normal speed, when I tell you 'fast', walk as fast as you can. When I say 'slow', walk very slowly." (2) Normal: Significantly changes walking speed without imbalance. (1) Moderate: Unable to change walking speed or signs of imbalance. (0) Severe: Unable to achieve significant change in walking speed AND signs of imbalance. [place] - HORIZONTAL Instruction: "Begin walking at your normal speed, when I say "right", turn your head and look to the right. When I say "left" turn your head and look to the left. Try to keep yourself walking in a straight line." (2) Normal: performs head turns with no change in gait speed and good balance. (1) Moderate: performs head turns with reduction in gait speed. (0) Severe: performs head turns with imbalance. 12. WALK WITH PIVOT TURNS Instruction: "Begin walking at your normal speed. When I tell you to 'turn and stop', turn as quickly as you can, face the opposite direction, and stop. After the turn, your feet should be close together." (2) Normal: Turns with feet close FAST (< 3 steps) with  good balance. (1) Moderate: Turns with feet close SLOW (>4 steps) with good balance. (0) Severe: Cannot turn with feet close at any speed without imbalance. 13. STEP OVER OBSTACLES Instruction: "Begin walking at your normal speed. When you get to the box, step over it, not around it and keep walking." (2) Normal: Able to step over box with minimal change of gait speed and with good balance. (1) Moderate: Steps over box but touches box OR displays cautious behavior by slowing gait. (0) Severe: Unable to step over box OR steps around box. 14. TIMED UP & GO WITH DUAL TASK [3 METER WALK] Instruction TUG: "When I say 'Go', stand up from chair, walk at your normal speed across the tape on the floor, turn around, and come back to sit in the chair." Instruction TUG with Dual Task: "Count backwards by threes starting at ___. When I say 'Go', stand up from chair, walk at your normal speed across the tape on the floor, turn around, and come back to sit in the chair. Continue counting backwards the entire time." TUG: ___8.75_____seconds; Dual Task TUG: __8.31______seconds (2) Normal: No noticeable change in sitting, standing or walking while backward counting when compared to TUG without Dual Task. (1) Moderate: Dual Task affects either counting OR walking (>10%) when compared to the TUG without Dual Task. (0) Severe: Stops counting while walking OR stops walking while counting. When scoring item 14, if subject's  gait speed slows more than 10% between the TUG without and with a Dual Task the score should be decreased by a point.  TOTAL SCORE: ___20_____/28              PT Education - 10/20/18 1251    Education Details  POC, clinical findings    Person(s) Educated  Patient    Methods  Explanation    Comprehension  Verbalized understanding       PT Short Term Goals - 10/20/18 1317      PT SHORT TERM GOAL #1   Title  Patient will be independent with initial HEP for balance and  strengthening in order to build upon functional gains in therapy. ALL STG TARGET DATE 11/19/18    Time  4    Period  Weeks    Status  New    Target Date  11/19/18      PT SHORT TERM GOAL #2   Title  Patient will verbalize fall prevention strategies in the home in order to decrease risk of future falls.    Time  4    Period  Weeks    Status  New      PT SHORT TERM GOAL #3   Title  Patient will improve miniBEST score to at least a 22/28 in order to demonstrate decreased fall risk.    Baseline  20/28 on 10/20/18    Time  4    Period  Weeks    Status  New      PT SHORT TERM GOAL #4   Title  Patient will perform 5 x sit to stand from 18" chair with no UE support demonstrating no posterior LOB.    Baseline  episode of posterior LOB and decreased eccentric control when lowering    Time  4    Period  Weeks    Status  New        PT Long Term Goals - 10/20/18 1320      PT LONG TERM GOAL #1   Title  Patient will be independent with final HEP in order to build upon functional gains made in therapy. ALL TARGET LTG DATE 12/19/18    Time  8    Period  Weeks    Status  New    Target Date  12/19/18      PT LONG TERM GOAL #2   Title  Patient will improve miniBEST score to at least a 24/28 in order to decrease risk of future falls.    Baseline  20/28 on 10/20/18    Time  8    Period  Weeks    Status  New      PT LONG TERM GOAL #3   Title  Patient will ambulate at least 56' with increased reciprocal arm swing and larger step lengths with no verbal cueing from therapist in order to improve gait mechanics.    Time  8    Period  Weeks    Status  New      PT LONG TERM GOAL #4   Title  Patient will demonstrate fluid gait with walking sticks mod I with no verbal cueing in order to increase safety while ambulating in his neighborhood.    Time  8    Period  Weeks    Status  New      PT LONG TERM GOAL #5   Title  Patient will perform sit to stand from <18" chair with no UE support and  no  posterior LOB upon standing in order to increase safety.    Time  8    Period  Weeks    Status  New      Additional Long Term Goals   Additional Long Term Goals  Yes      PT LONG TERM GOAL #6   Title  Patient will ambulate 1,000' outside over uneven surfaces/inclines with no AD/LRAD and mod I in order to return to prior level of fitness in the community/neighborhood.    Time  8    Period  Weeks    Status  New             Plan - 10/20/18 1301    Clinical Impression Statement  Patient is a 73 year old male referred to Neuro OPPT for evaluation with primary concern of history of falls and impaired balance. Patient is known to this therapist, since his PD screen in March of 2020 pt has had falls and acceleration of gait with difficulty stopping.   Pt's PMH is significant for: PD (2008), bilateral DBS surgery (2016). . The following deficits were present during the exam: bradykinesia, rigidity, impaired coordination, impaired balance, decreased LE strength, impaired gait abnormalities. Pt's MiniBEST scores indicate pt is at a high risk for falls. Pt would benefit from skilled PT to address these impairments and functional limitations to maximize functional mobility independence and decrease future fall risk.    Personal Factors and Comorbidities  Comorbidity 1;Past/Current Experience    Comorbidities  PD    Examination-Activity Limitations  Locomotion Level    Examination-Participation Restrictions  Other   Exercise (previously liked to jog)   Stability/Clinical Decision Making  Evolving/Moderate complexity    Clinical Decision Making  Moderate    Rehab Potential  Good    PT Frequency  2x / week    PT Duration  8 weeks    PT Treatment/Interventions  ADLs/Self Care Home Management;Therapeutic exercise;Therapeutic activities;Functional mobility training;Stair training;Gait training;DME Instruction;Balance training;Neuromuscular re-education;Patient/family education    PT Next Visit Plan   Gait training. Initial HEP for balance/strengthening (PWR! moves). Educate patient on fall prevention in the home.       Patient will benefit from skilled therapeutic intervention in order to improve the following deficits and impairments:  Abnormal gait, Decreased balance, Decreased coordination, Decreased range of motion, Decreased mobility, Decreased strength, Difficulty walking, Postural dysfunction, Decreased activity tolerance  Visit Diagnosis: 1. Other lack of coordination   2. Other symptoms and signs involving the nervous system   3. Other abnormalities of gait and mobility   4. Abnormal posture   5. Unsteadiness on feet   6. History of falling   7. Muscle weakness (generalized)        Problem List Patient Active Problem List   Diagnosis Date Noted  . Parkinson disease ([place])     Arliss Journey, PT, DPT 10/20/2018, 3:15 PM  [facility] [address] [place], [name], [address] Phone: [phone]   Fax:  [phone]  Name: Darreon Lutes MRN: [id number] Date of Birth: 1945-04-30

## 2018-10-20 NOTE — Therapy (Signed)
[facility] [address] [place], [name], [address] Phone: [phone]   Fax:  [phone]  Occupational Therapy Treatment  Patient Details  Name: Ronald Noble MRN: [id number] Date of Birth: Dec 25, 1945 Referring Provider (OT): Dr. Star Age   Encounter Date: 10/20/2018  OT End of Session - 10/20/18 0713    Visit Number  6   corrected count   Number of Visits  17    Date for OT Re-Evaluation  11/04/18    Authorization Type  Medicare & BCBS, covered 100%    Authorization Time Period  cert period 06/05/65-04/29/56    Authorization - Visit Number  6    Authorization - Number of Visits  10    OT Start Time  0707    OT Stop Time  0750    OT Time Calculation (min)  43 min    Activity Tolerance  Patient tolerated treatment well    Behavior During Therapy  [facility] for tasks assessed/performed       Past Medical History:  Diagnosis Date  . BPH (benign prostatic hyperplasia)   . Parkinson disease ([place])   . Skin cancer of face    arms and back  . Sleep apnea    mild  . Spider bite    brown recluse/ left ankle/08/13    Past Surgical History:  Procedure Laterality Date  . BURR HOLE W/ STEREOTACTIC INSERTION OF DBS LEADS / INTRAOP MICROELECTRODE RECORDING  04/2014  . COLONOSCOPY    . KNEE SURGERY     exploratory/left  . SKIN CANCER EXCISION  01/2014   face  . TRANSURETHRAL RESECTION OF PROSTATE     2008    There were no vitals filed for this visit.  Subjective Assessment - 10/20/18 0998    Subjective   no problems with procedure to replace DBS battery    Pertinent History  PD diagnosed in 2008, bilateral DBS 2016    Limitations  fall risk, bilateral DBS    Patient Stated Goals  improve coordination    Currently in Pain?  No/denies    Pain Onset  More than a month ago           CLINIC OPERATION CHANGES: Outpatient Neuro Rehab is open at lower capacity following universal masking, social  distancing, and patient screening.  The patient's COVID risk of complications score is 3.   PWR! Moves (up, rock, twist) in supine x 20 each with min cues For incr movement amplitude (incr use of feet, open hands).  PWR! Moves (basic 4) in quadruped x 15-20 each with min cues For incr movement amplitude.  Min A/mod cues for PWR! Step due to decr balance (recommended pt only perform step without up or use chair for balance at home for incr safety).    In supine, shoulder flex AAROM with ball for stretch with min v.c.   Fastening/unfastening buttons on tabletop with min cueing for use of PWR! Hands, to correct posture/R side lean, and reposition shirt.  x2 with cueing for PWR! Up with PWR! Hands between buttons with incr success on 2nd attempt.  In standing, functional reaching laterally/overhead incorporating lateral wt. Shift and trunk rotation for incr balance, R trunk elongation and wt. Shift to L for improved midline alignment.  Pt needed min cueing for posture, large amplitude movement with feet apart and reach.  In standing, lateral wt. Shifts with focus midline alignment with min-mod cueing.  Recommended pt perform in sitting/standing for improved posture  and keep feet slightly wider than shoulder width.           OT Short Term Goals - 09/08/18 0740      OT SHORT TERM GOAL #1   Title  Pt will be independent with updated PD specific HEP--check STGs 10/05/18    Time  4    Period  Weeks    Status  New      OT SHORT TERM GOAL #2   Title  Pt will be able to sustain upright posture for at least 5 min during seated functional task for incr ease using mouse and incr safety for driving.    Baseline  leans to the R and uses RUE support    Time  4    Period  Weeks    Status  New      OT SHORT TERM GOAL #3   Title  Pt will demo improved coordination/functional reaching for ADLs as shown by improving score on box and blocks test by at least 3 bilaterally.    Baseline  R-49 blocks,  L-43 blocks    Time  4    Period  Weeks    Status  New      OT SHORT TERM GOAL #4   Title  Pt will be able to fasten/unfasten 3 buttons in 55sec or less for improved bilateral hand coordination/incr ease with dressing.    Baseline  64.34sec    Time  4    Period  Weeks    Status  New      OT SHORT TERM GOAL #5   Title  -----    Baseline  ---        OT Long Term Goals - 09/05/18 1131      OT LONG TERM GOAL #1   Title  Pt will verbalize understanding of adaptive strategies to incr safety/ease with ADL's/IADLS's--LTGs due 6/44/03    Time  8    Period  Weeks    Status  New      OT LONG TERM GOAL #2   Title  Pt will improve bilateral functional reach and balance as shown by improving standing functional reach by at least 2" bilaterally.    Baseline  RUE 7.5, LUE 8.5    Time  8    Period  Weeks    Status  New   11 inches     OT LONG TERM GOAL #3   Title  Pt will be able to sustain upright posture for at least 8 min during seated functional task for incr ease using mouse and incr safety for driving.      OT LONG TERM GOAL #4   Title  Pt will demo improved coordination/functional reaching for ADLs as shown by improving score on box and blocks test by at least 6 bilaterally.    Baseline  R-49, L-43 blocks    Time  8    Period  Weeks    Status  New   135 shoulder flexion, -5     OT LONG TERM GOAL #5   Title  ------            Plan - 10/20/18 4742    Clinical Impression Statement  Pt with significant improvement with use of PWR! up between each button today.   Encouraged pt to utilize this strategy frequently when on computer.    Occupational Profile and client history currently impacting functional performance  Pt works full time as an Forensic psychologist, he  reports slowing of ADLs, incr difficulty with gait/falls, and leaning to R side.  PMH: PD diagnosed in 2008, s/p bilateral DBS, skin cancer, sleep apnea, BPH, TURP, orthostatic hypotension, syncopal episodes; arthritic  changes in spine with hx of back pain    Occupational performance deficits (Please refer to evaluation for details):  ADL's;IADL's;Work;Leisure;Social Participation    Body Structure / Function / Physical Skills  ADL;Coordination;Endurance;UE functional use;GMC;Decreased knowledge of precautions;Balance;Decreased knowledge of use of DME;Flexibility;IADL;Pain;FMC;Dexterity;Mobility;Gait;ROM;Tone    Cognitive Skills  Attention;Safety Awareness;Memory    Rehab Potential  Good    OT Frequency  2x / week    OT Duration  8 weeks    OT Treatment/Interventions  Self-care/ADL training;Therapeutic exercise;Patient/family education;Moist Heat;Energy conservation;Therapist, nutritional;Therapeutic activities;Balance training;Cognitive remediation/compensation;Passive range of motion;Manual Therapy;DME and/or AE instruction;Cryotherapy;Fluidtherapy;Aquatic Therapy;Neuromuscular education    Plan  work on posture and shoulder ROM, functional reach, coordination, begin checking STGs    Recommended Other Services  physical therapy due to falls/changes in gait    Consulted and Agree with Plan of Care  Patient       Patient will benefit from skilled therapeutic intervention in order to improve the following deficits and impairments:   Body Structure / Function / Physical Skills: ADL, Coordination, Endurance, UE functional use, GMC, Decreased knowledge of precautions, Balance, Decreased knowledge of use of DME, Flexibility, IADL, Pain, FMC, Dexterity, Mobility, Gait, ROM, Tone Cognitive Skills: Attention, Safety Awareness, Memory     Visit Diagnosis: 1. Other symptoms and signs involving the musculoskeletal system   2. Other lack of coordination   3. Other symptoms and signs involving the nervous system   4. Stiffness of right shoulder, not elsewhere classified   5. Other abnormalities of gait and mobility   6. Stiffness of left shoulder, not elsewhere classified   7. Abnormal posture   8. Attention  and concentration deficit   9. Unsteadiness on feet       Problem List Patient Active Problem List   Diagnosis Date Noted  . Parkinson disease ([place])     [facility] 10/20/2018, 8:02 AM  [facility] [address] [place], [name], [address] Phone: [phone]   Fax:  [phone]  Name: Ronald Noble MRN: [id number] Date of Birth: 01-06-1946   Vianne Bulls, OTR/L [facility] [address] [place], [place]  [address] [phone] phone [phone] 10/20/18 8:02 AM

## 2018-10-21 ENCOUNTER — Ambulatory Visit: Payer: Medicare Other | Admitting: Physical Therapy

## 2018-10-21 ENCOUNTER — Encounter: Payer: Medicare Other | Admitting: Occupational Therapy

## 2018-10-24 ENCOUNTER — Other Ambulatory Visit: Payer: Self-pay

## 2018-10-24 ENCOUNTER — Encounter: Payer: Self-pay | Admitting: Occupational Therapy

## 2018-10-24 ENCOUNTER — Ambulatory Visit: Payer: Medicare Other | Admitting: Occupational Therapy

## 2018-10-24 ENCOUNTER — Ambulatory Visit: Payer: Medicare Other | Admitting: Physical Therapy

## 2018-10-24 ENCOUNTER — Ambulatory Visit
Admission: RE | Admit: 2018-10-24 | Discharge: 2018-10-24 | Disposition: A | Discharge status: Home / Self Care | Payer: Medicare Other | Source: Ambulatory Visit | Attending: Family Medicine | Admitting: Family Medicine

## 2018-10-24 ENCOUNTER — Other Ambulatory Visit: Payer: Self-pay | Admitting: Family Medicine

## 2018-10-24 DIAGNOSIS — M25551 Pain in right hip: Secondary | ICD-10-CM

## 2018-10-24 DIAGNOSIS — R269 Unspecified abnormalities of gait and mobility: Secondary | ICD-10-CM | POA: Diagnosis not present

## 2018-10-24 DIAGNOSIS — G20 Parkinson's disease: Secondary | ICD-10-CM | POA: Diagnosis not present

## 2018-10-24 DIAGNOSIS — W010XXA Fall on same level from slipping, tripping and stumbling without subsequent striking against object, initial encounter: Secondary | ICD-10-CM | POA: Diagnosis not present

## 2018-10-24 DIAGNOSIS — S79911A Unspecified injury of right hip, initial encounter: Secondary | ICD-10-CM | POA: Diagnosis not present

## 2018-10-27 ENCOUNTER — Ambulatory Visit: Payer: Medicare Other | Admitting: Occupational Therapy

## 2018-10-27 ENCOUNTER — Other Ambulatory Visit: Payer: Self-pay

## 2018-10-27 ENCOUNTER — Ambulatory Visit: Payer: Medicare Other | Admitting: Physical Therapy

## 2018-10-27 ENCOUNTER — Encounter: Payer: Self-pay | Admitting: Occupational Therapy

## 2018-10-27 DIAGNOSIS — M25611 Stiffness of right shoulder, not elsewhere classified: Secondary | ICD-10-CM | POA: Diagnosis not present

## 2018-10-27 DIAGNOSIS — M25612 Stiffness of left shoulder, not elsewhere classified: Secondary | ICD-10-CM | POA: Diagnosis not present

## 2018-10-27 DIAGNOSIS — R29898 Other symptoms and signs involving the musculoskeletal system: Secondary | ICD-10-CM

## 2018-10-27 DIAGNOSIS — R2689 Other abnormalities of gait and mobility: Secondary | ICD-10-CM

## 2018-10-27 DIAGNOSIS — R29818 Other symptoms and signs involving the nervous system: Secondary | ICD-10-CM

## 2018-10-27 DIAGNOSIS — R278 Other lack of coordination: Secondary | ICD-10-CM | POA: Diagnosis not present

## 2018-10-27 DIAGNOSIS — R2681 Unsteadiness on feet: Secondary | ICD-10-CM

## 2018-10-27 DIAGNOSIS — R293 Abnormal posture: Secondary | ICD-10-CM

## 2018-10-27 DIAGNOSIS — R41840 Attention and concentration deficit: Secondary | ICD-10-CM

## 2018-10-27 NOTE — Therapy (Addendum)
[facility] [address] [place], [name], [address] Phone: [phone]   Fax:  [phone]  Occupational Therapy Treatment  Patient Details  Name: Ronald Noble MRN: [id number] Date of Birth: Mar 29, 1946 Referring Provider (OT): Dr. Star Age   Encounter Date: 10/27/2018  OT End of Session - 10/27/18 0839    Visit Number  7    Number of Visits  17    Date for OT Re-Evaluation  11/04/18    Authorization Type  Medicare & BCBS, covered 100%    Authorization Time Period  cert period 09/05/58-7/37/10    Authorization - Visit Number  7    Authorization - Number of Visits  10    OT Start Time  0707    OT Stop Time  0750    OT Time Calculation (min)  43 min    Activity Tolerance  Patient tolerated treatment well    Behavior During Therapy  [facility] for tasks assessed/performed       Past Medical History:  Diagnosis Date  . BPH (benign prostatic hyperplasia)   . Parkinson disease ([place])   . Skin cancer of face    arms and back  . Sleep apnea    mild  . Spider bite    brown recluse/ left ankle/08/13    Past Surgical History:  Procedure Laterality Date  . BURR HOLE W/ STEREOTACTIC INSERTION OF DBS LEADS / INTRAOP MICROELECTRODE RECORDING  04/2014  . COLONOSCOPY    . KNEE SURGERY     exploratory/left  . SKIN CANCER EXCISION  01/2014   face  . TRANSURETHRAL RESECTION OF PROSTATE     2008    There were no vitals filed for this visit.  Subjective Assessment - 10/27/18 0711    Subjective   Pt fell Sat 10/22/18 while changing bag on lawnmower on gradual incline.  Had x-ray for hip which was normal.  Pt reports no R shoulder pain during activity today.    Pertinent History  PD diagnosed in 2008, bilateral DBS 2016    Limitations  fall risk, bilateral DBS    Patient Stated Goals  improve coordination    Currently in Pain?  Yes    Pain Score  1     Pain Location  Hip   R shoulder   Pain Orientation  Right     Pain Descriptors / Indicators  Sore    Pain Type  Acute pain    Pain Onset  More than a month ago    Pain Frequency  Intermittent    Aggravating Factors   standing    Pain Relieving Factors  rest       CLINIC OPERATION CHANGES: Outpatient Neuro Rehab is open at lower capacity following universal masking, social distancing, and patient screening.  The patient's COVID risk of complications score is 3.  Ambulating/functional mobility with emphasis on movement quality (heel strike, posture, arm swing, slower deliberate/larger step-size) with min-mod cueing incorporating turning strategy, sit>stand, stand>sit, side stepping in front of chair and stepping away from table.  In standing at counter with 1 UE support, PWR! Step with min cueing for normal movement patterns and education on exercise's link to function.    Education provided/reviewed regarding importance of PWR! Moves to address PD related deficits and functional difficulties for brain change.  Also educated pt on how resistive flex exercises can encourage flex posture and incr rigidity, how overhead resistive exercises can incr risk for shoulder pain/injury due to posture  changes/rigidity.  Recommended against jogging or walking for cardiovascular exercise due to tendency for festination/and reinforcing abnormal movement patterns.  Encouraged pt to use stationary bike or swimming as better alternatives for aerobic exercise.  Also recommended yoga or resistive strengthening exercise as good exercise for rigidity, but emphasized importance of performing functional exercise before addition additional exercise.  Pt verbalized understanding.  Arm bike x52min level 3 for reciprocal movement with cues/target of at least 45-50rpms for intensity while maintaining movement amplitude/reciprocal movement (forward/backwards).   Pt maintained 45-55rpms         OT Education - 10/27/18 0836    Education Details  Long discussion/education provided  regarding appropriate exercise for function and avoiding exercise that incr risk for injury and encourages abnormal movement patterns or incr rigidity--Pt encouraged to perform PWR! moves and walking with focus on form/quality (not for cardiovascular exercise) prior to adding additional exercises--see pt instructions    Person(s) Educated  Patient    Methods  Explanation;Demonstration;Verbal cues    Comprehension  Verbalized understanding;Returned demonstration;Verbal cues required       OT Short Term Goals - 10/27/18 0853      OT SHORT TERM GOAL #1   Title  Pt will be independent with updated PD specific HEP--check STGs 10/05/18    Time  4    Period  Weeks    Status  On-going      OT SHORT TERM GOAL #2   Title  Pt will be able to sustain upright posture for at least 5 min during seated functional task for incr ease using mouse and incr safety for driving.    Baseline  leans to the R and uses RUE support    Time  4    Period  Weeks    Status  New      OT SHORT TERM GOAL #3   Title  Pt will demo improved coordination/functional reaching for ADLs as shown by improving score on box and blocks test by at least 3 bilaterally.    Baseline  R-49 blocks, L-43 blocks    Time  4    Period  Weeks    Status  New      OT SHORT TERM GOAL #4   Title  Pt will be able to fasten/unfasten 3 buttons in 55sec or less for improved bilateral hand coordination/incr ease with dressing.    Baseline  64.34sec    Time  4    Period  Weeks    Status  New      OT SHORT TERM GOAL #5   Title  -----    Baseline  ---        OT Long Term Goals - 09/05/18 1131      OT LONG TERM GOAL #1   Title  Pt will verbalize understanding of adaptive strategies to incr safety/ease with ADL's/IADLS's--LTGs due 0/35/46    Time  8    Period  Weeks    Status  New      OT LONG TERM GOAL #2   Title  Pt will improve bilateral functional reach and balance as shown by improving standing functional reach by at least 2"  bilaterally.    Baseline  RUE 7.5, LUE 8.5    Time  8    Period  Weeks    Status  New   11 inches     OT LONG TERM GOAL #3   Title  Pt will be able to sustain upright posture for at  least 8 min during seated functional task for incr ease using mouse and incr safety for driving.      OT LONG TERM GOAL #4   Title  Pt will demo improved coordination/functional reaching for ADLs as shown by improving score on box and blocks test by at least 6 bilaterally.    Baseline  R-49, L-43 blocks    Time  8    Period  Weeks    Status  New   135 shoulder flexion, -5     OT LONG TERM GOAL #5   Title  ------            Plan - 10/27/18 1497    Clinical Impression Statement  Pt would benefit from reinforcement regarding movement quality and exercise for brain change/function due to fall and risk for future complications and fall risk/hx of falls.    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  PMH: PD diagnosed in 2008, s/p bilateral DBS, skin cancer, sleep apnea, BPH, TURP, orthostatic hypotension, syncopal episodes; arthritic changes in spine with hx of back pain    Occupational performance deficits (Please refer to evaluation for details):  ADL's;IADL's;Work;Leisure;Social Participation    Body Structure / Function / Physical Skills  ADL;Coordination;Endurance;UE functional use;GMC;Decreased knowledge of precautions;Balance;Decreased knowledge of use of DME;Flexibility;IADL;Pain;FMC;Dexterity;Mobility;Gait;ROM;Tone    Cognitive Skills  Attention;Safety Awareness;Memory    Rehab Potential  Good    OT Frequency  2x / week    OT Duration  8 weeks    OT Treatment/Interventions  Self-care/ADL training;Therapeutic exercise;Patient/family education;Moist Heat;Energy conservation;Therapist, nutritional;Therapeutic activities;Balance  training;Cognitive remediation/compensation;Passive range of motion;Manual Therapy;DME and/or AE instruction;Cryotherapy;Fluidtherapy;Aquatic Therapy;Neuromuscular education    Plan  work on posture and shoulder ROM, functional reach, ADL strategies, begin checking STGs    Recommended Other Services  physical therapy due to falls/changes in gait    Consulted and Agree with Plan of Care  Patient       Patient will benefit from skilled therapeutic intervention in order to improve the following deficits and impairments:   Body Structure / Function / Physical Skills: ADL, Coordination, Endurance, UE functional use, GMC, Decreased knowledge of precautions, Balance, Decreased knowledge of use of DME, Flexibility, IADL, Pain, FMC, Dexterity, Mobility, Gait, ROM, Tone Cognitive Skills: Attention, Safety Awareness, Memory     Visit Diagnosis: 1. Other symptoms and signs involving the nervous system   2. Abnormal posture   3. Unsteadiness on feet   4. Other abnormalities of gait and mobility   5. Other symptoms and signs involving the musculoskeletal system   6. Other lack of coordination   7. Stiffness of right shoulder, not elsewhere classified   8. Attention and concentration deficit       Problem List Patient Active Problem List   Diagnosis Date Noted  . Parkinson disease [facility])     [facility] 10/27/2018, 9:38 AM  [facility] [address] [place], [name], [address] Phone: [phone]   Fax:  [phone]  Name: Ronald Noble MRN: [id number] Date of Birth: Aug 07, 1945   Vianne Bulls, OTR/L [facility] [address] [place], [place]  [address] [phone] phone [phone] 10/27/18 9:38 AM

## 2018-10-27 NOTE — Patient Instructions (Signed)
   Everyday:    Do PWR! Moves    - On back (supine)   - On stomach (prone)   - On hands/knees (all fours)   - Standing  Walk short distances with focus on step size, slowing down, putting heel down first, and posture.

## 2018-11-01 ENCOUNTER — Ambulatory Visit: Payer: Medicare Other | Admitting: Occupational Therapy

## 2018-11-01 ENCOUNTER — Ambulatory Visit: Payer: Medicare Other | Admitting: Physical Therapy

## 2018-11-01 ENCOUNTER — Encounter: Payer: Self-pay | Admitting: Neurology

## 2018-11-01 ENCOUNTER — Other Ambulatory Visit: Payer: Self-pay

## 2018-11-01 ENCOUNTER — Ambulatory Visit (INDEPENDENT_AMBULATORY_CARE_PROVIDER_SITE_OTHER): Payer: Medicare Other | Admitting: Neurology

## 2018-11-01 VITALS — BP 118/69 | HR 69 | Ht 71.0 in | Wt 157.0 lb

## 2018-11-01 DIAGNOSIS — M6281 Muscle weakness (generalized): Secondary | ICD-10-CM

## 2018-11-01 DIAGNOSIS — M25611 Stiffness of right shoulder, not elsewhere classified: Secondary | ICD-10-CM | POA: Diagnosis not present

## 2018-11-01 DIAGNOSIS — G20 Parkinson's disease: Secondary | ICD-10-CM

## 2018-11-01 DIAGNOSIS — R2689 Other abnormalities of gait and mobility: Secondary | ICD-10-CM | POA: Diagnosis not present

## 2018-11-01 DIAGNOSIS — Z9689 Presence of other specified functional implants: Secondary | ICD-10-CM | POA: Diagnosis not present

## 2018-11-01 DIAGNOSIS — R29818 Other symptoms and signs involving the nervous system: Secondary | ICD-10-CM

## 2018-11-01 DIAGNOSIS — R293 Abnormal posture: Secondary | ICD-10-CM

## 2018-11-01 DIAGNOSIS — G479 Sleep disorder, unspecified: Secondary | ICD-10-CM | POA: Diagnosis not present

## 2018-11-01 DIAGNOSIS — R29898 Other symptoms and signs involving the musculoskeletal system: Secondary | ICD-10-CM

## 2018-11-01 DIAGNOSIS — R41840 Attention and concentration deficit: Secondary | ICD-10-CM

## 2018-11-01 DIAGNOSIS — R278 Other lack of coordination: Secondary | ICD-10-CM | POA: Diagnosis not present

## 2018-11-01 DIAGNOSIS — G4752 REM sleep behavior disorder: Secondary | ICD-10-CM | POA: Diagnosis not present

## 2018-11-01 DIAGNOSIS — M25612 Stiffness of left shoulder, not elsewhere classified: Secondary | ICD-10-CM

## 2018-11-01 MED ORDER — CLONAZEPAM 0.25 MG PO TBDP: 0.2500 mg | ORAL_TABLET | Freq: Every day | ORAL | 5 refills | Status: DC

## 2018-11-01 NOTE — Patient Instructions (Signed)
As discussed we will keep your medication regimen the same.  You could try Voltaren gel (topical diclofenac) On the sore area of your right hip.  If you do not feel much better about a week from now, please let me know, I can certainly order a MRI of the right hip, so long as you can have an MRI with the DBS in place.  Please try to work on strengthening exercises and more aerobic exercises within limitations of social distancing.Marland Kitchen

## 2018-11-01 NOTE — Progress Notes (Signed)
Subjective:    Patient ID: Ronald Noble is a 73 y.o. male.  HPI     Interim history:   Mr. Ronald Noble is a 73 year old right-handed gentleman with an underlying medical history of skin cancer including melanoma, enlarged prostate (s/p TURP), who presents for followup consultation of his R sided predominant Parkinson's disease, complicated by RBD and sleep disturbance as well as mild dyskinesias, with s/p bilateral DBS placements at [facility], s/p L IPG replacement on the L in 09/2018. He is accompanied by his wife today. I last saw him on 04/28/2018, at which time he reported doing well, had no falls, weight has been stable.  I suggested he keep his medications the same.  Today, 11/01/2018: He reports difficulty with his balance, his gait is erratic sometimes.  He has difficulty stopping when he has an episode of festination.  He has taken a few controlled falls on soft ground when possible, especially when walking outside.  He tries to stay active but has really noted difficulty with his mobility since the stay at home recommendation and the pandemic.  Of note, he had left IPG replacement on 09/23/2018 under Dr. Salomon Fick.  He had a subsequent programming appointment at [facility] on 09/28/2018 as well as a Botox injection appointment for blepharospasm. He recently fell and bumped his right hip.  He is still sore.  It is hard for him to weight-bear.  He had an x-ray which was negative for any bony injury.  He is slightly better but still achy.   The patient's allergies, current medications, family history, past medical history, past social history, past surgical history and problem list were reviewed and updated as appropriate.    Previously (copied from previous notes for reference):      I saw him on 10/26/2017, at which time he reported doing okay, tried Gocovri for one month, but it was cost prohibitive. He was back on amantadine 100 mg bid. He tried the reduced dose of amantadine but did not do  well on the lower dose. He reported no recent falls. Was getting Botox injections for blepharospasm through [name]. He was on Sinemet 1-1/2 pills 5 times a day, long-acting Requip once daily at 5 PM. I asked him to continue with his medication regimen.   I saw him on 04/28/2017, at which time he reported some daytime somnolence. He was supposed to get cataract surgeries and colonoscopy done. He had done well after Botox injections at [facility]. He was on amantadine twice daily and I suggested we reduce it to once daily and consider taking him off altogether. I suggested he take his long-acting Requip 6 mg once daily in the evening, instead of during the day. We kept his Sinemet at 1-1/2 pills 5 times a day.     I saw him on 10/29/2016, at which time he reported doing okay, he had no recent falls, his left eye ptosis improved after adjustment of his DBS. He did not end up having any Botox injections to his eyelid. He was on Sinemet 1-1/2 pills 5 times a day, 3 hourly intervals, starting approximately at 8 AM. He was on amantadine twice daily and Requip XL around 1 PM.    I saw him on 04/09/2016, at which time he was doing okay, he had improvement in his blood first specimen on the left in facial dyskinesias. He had been followed at [facility]. He was started on amantadine and was taking it twice daily. He was off of  Stalevo completely. He felt that his abdominal cramping and facial dyskinesias improved after he came off of Stalevo altogether. He was in the process of being scheduled for Botox injections for his blepharospasm at [facility]. He was still on once daily long-acting Requip, when he tried to stop it completely he noticed a flareup of his RLS. He denied any recent falls. He was exercising regularly. He did have some constipation but generally under control with some regularity in his bowel movements reported.    I saw him on 07/09/2015, at which time he reported no recent syncope. He had  a tilt table test in January 2017 which showed abnormal findings but minimal symptoms reported. He saw Dr. Hartford Poli with vascular surgery for consultation on 05/28/2015. He had a 24-hour blood pressure monitor. He had a follow-up appointment pending with his vascular specialist. He also had an appointment pending with [name] in October 2017. He felt he pulled a muscle in his upper back on the right. He was on Stalevo about 4 times a day, sometimes 5 times a day depending on his day to day activities. He was on long-acting Requip 6 mg once daily. He was sleeping a little better. He had been advised to start using compression stockings but had not started yet. He had noticed worsening of droopy eyelid. He mailed in December 2017 Re: Blepharospasm and treatment for this. He had been seeing an ophthalmologist.   I saw him on 03/11/2015, at which time he reported a recent fall in September 2016. He had just come back from a road trip and after being in the car for about 2-1/2 hours he got out of the car and fell to the ground for his wife even realize what happened. He fell on pavement and chipped his tooth. They were with some friends who helped. He had to have a crown placed over his chipped tooth. His DBS settings were adjusted when he had his follow-up at [facility]. His orthostatic blood pressure values were stable. Since his settings were changed he had no other syncopal spell. He did have some medication changes as well. He was no longer on clonazepam. Stalevo was 100 mg strength 4-5 times a day. Requip long-acting was 6 mg once daily. She was on long-acting melatonin 6 mg each night. He was advised to drink 4 bottles of water. He was scheduled for tilt table test as well. His wife was concerned about him climbing on ladders. We talked about gait safety and syncope risk. He was advised not to climb on any ladders or work at heights.    I saw him on 11/07/2014, at which time he reported doing  reasonably well. He had nasal congestion after his fall and injury. Of note, unfortunately, he had a syncopal spell on 10/29/2014 and hit his nose as he fell and sustained a nasal fracture. I reviewed the emergency room records. He was referred to ENT. His laceration was repaired with Dermabond. He reported that he stood up and felt lightheaded and then passed out. He fell and hit his face. He had a CT head without contrast as well as maxillofacial CT without contrast on 10/29/2014 which showed: RIGHT and LEFT nasal bone fractures. Small displaced fracture fragment on the LEFT. No skull fracture or intracranial hemorrhage. Unremarkable appearing deep brain stimulator leads. He fell at work. He had taken Levitra at night and 2 days prior he was working hard in the yard. He is notoriously not drinking enough water  he admits. He had taken his Stalevo in the morning and was due for his next dose and became lightheaded when he got up quickly from his desk. He had been sitting for about 45 minutes consistently at his desk. He did have a warning sign and kept walking instead of sitting down again. He had a follow-up appointment at [facility] on 11/02/2014 and his Requip was increased slightly again. He had re-programming of his DBS as well. He had reduced the Requip XL to 3 mg twice daily but this was increased to 4 mg twice daily. His Stalevo was at 4-6 pills daily depending on his work day. He does get sleepy at night around 9:30 and often falls asleep while watching TV. He tried Melatonin long-acting but in combination with clonazepam at night it did not work well and in fact he had insomnia. He tried to reduce his clonazepam to 0.25 mg each night but increased it back to 0.5 mg each night. For his nose fracture he saw Dr. Redmond Baseman and was scheduled for surgery. I did not change his medications. I asked him to drink more water and reminded him to change positions slowly.   I saw him on 08/07/2014, at which time he  reported doing quite well after his DBS placement. His wife felt that he was doing great. He was eating well. He was not drinking enough water. He had trace edema in his distal lower extremities. He had 2 appointments for DBS programming. His Stalevo was reduced from 6 pills a day to 4 pills a day but then he increased it back to 5 pills a day. He was off generic Sinemet. He was on Azilect once daily and Requip long-acting 8 mg twice daily.    He's been back to work. He is currently on Azilect once daily, Requip XL 8 mg twice daily, Stalevo 100 mg strength one pill 5 times a day and he has stopped the carbidopa-levodopa. I reviewed his outside records from Dr. Salomon Fick at [facility] neurosurgery. Patient had bilateral STN DBS electrodes placed on 06/01/2014. He had pulse generators placed approximately 3 weeks after that, 06/13/14.   I saw him on 02/01/2014, at which time he reported difficulty staying asleep. He had not consistently tried melatonin. He had problems with urinary urgency and was advised to try Kegel exercises by his urologist. His PCP talked to an infectious diseases specialist and it was determined he did not actually have Lyme disease.    He did not show for an appointment on 01/30/2014 - he genuinely forgot!   I saw him on 12/19/2013, at which time he reported that his medication was not working as well. He reported more freezing episodes and more off time. Of note, he had been using a new protein milkshake that he was drinking first thing in the morning. He had lost some weight and in an effort to improve his weight loss he had started this new supplements. His primary care physician had referred him to a nutritionist. We also talked about DBS evaluation and he requested to see another specialist for DBS candidacy. I referred him to [name] at [facility] I also advised him to discontinue using the new protein supplement as I felt it may be interfering  with the absorption of his Stalevo. I also suggested a trial of Parcopa half a pill 3 times a day at 11, 2 PM and 7 PM and we kept the Stalevo the same. He  was advised to use MiraLAX as needed for constipation.   I saw him on 07/31/13, at which time [name], including swimming, yoga, and weights. He has been on Stalevo 100 mg every 2 1/2 hours starting at 7:30 AM for 6 doses. He stopped clonazepam, as he felt some daytime sluggishness. He has been taking C/L 1/2 tid in between. He had no cognitive issues, but did have some "downtime" midday. I did not change his medications. He had an appointment with [name] at [facility] in June, but there was a mix up with his appointment and he does not have an appointment until next year.   In the interim, he was diagnosed with Lyme d/s in July. He was tested and treated by his PCP has has been referred to an infectious d/s doctor. He brings in his latest lying test results from 12/05/2013 which I reviewed: It looks like his IgM antibody status is negative but his IgG antibody status has not quite turn positive as far as I understand the report. That means he is probably in the subacute or early chronic phase if I interpret this correctly. He requested a sooner than scheduled appointment, due to feeling more freezing and more off time. This starts in the late morning hours but can also go into the afternoon after lunch. Cognitively and mood wise he feels stable. He does not sleep well at night. He has not consistently tried melatonin I recommended.   I saw him on 01/30/2013, at which time I felt he was stable. We talked about DBS surgery. I did not make any medication changes. He is aware of the increase risk of melanoma associated with DAs and his dermatologist is aware that he is taking Requip XL.   I first met him on 09/27/2012, which time I did not make any changes to his medication and did not order any new test as  he was stable. He saw [name] at [facility] and discussed DBS in the past but was not deemed a cadidate for it at the time as he was doing well. He was told by [name] to stay on schedule with his medication. 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 Sleep has been an issue. He goes to sleep well, but wakes up after 45 minutes to 1 hour. He is seeing a urologist. He exercises regularly.   He previously followed with [name] and was last seen by him on 05/25/2012, at which time [name] felt he was stable, but because of off time at 2 hours he increased his carbidopa-levodopa by adding half of a 10-100 mg strength 3 times a day to his regimen. He recommended decreasing his Stalevo to 125 mg from 100 mg 6 times a day. He has been on Stalevo 125 mg 6 times a day, at 7:30, 10, 12:30, 3 PM, 5:30 PM and 8 PM. He is on clonazepam 0.5 mg at night, Requip long-acting 6 mg twice daily, rasagiline 1 mg once daily, carbidopa-levodopa 10-100 mg strength half a tablet 3 times a day, 7:30 to 10, then midday and later in the evening.   He was followed by [name] since 2008 with a history of Parkinson's disease. He has a history of increased tone and slowness as well as tremor with R sided predominance. He was initially placed on Requip XL and then rasagiline was added in October 2008.  In February 2010 Stalevo was added. He had low vitamin D levels in 2008. ANA, ESR, ceruloplasmin were normal except for slightly low ceruloplasmin of 16. Urine copper test was negative. Methylmalonic acid was normal. Urine for heavy metals is normal as well. MRI brain with and without contrast on 01/07/2007 was normal. He had a sleep study in October 2008 showing PLMs and evidence of RBD. Repeat sleep study in September 2011 showed supine obstructive sleep apnea and RBD. He had a CPAP titration study in late September 2011 which was not very successful. He has been on clonazepam at night. He has not noticed any  compulsive behaviors, memory loss, depression, swelling, or orthostatic dizziness. He recently had a skin biopsy showing atypical cells, concerning for melanoma.   He works full-time as an Forensic psychologist, [employer]. He exercises regularly and does yoga.    His Past Medical History Is Significant For: Past Medical History:  Diagnosis Date  . BPH (benign prostatic hyperplasia)   . Parkinson disease ([place])   . Skin cancer of face    arms and back  . Sleep apnea    mild  . Spider bite    brown recluse/ left ankle/08/13    His Past Surgical History Is Significant For: Past Surgical History:  Procedure Laterality Date  . BURR HOLE W/ STEREOTACTIC INSERTION OF DBS LEADS / INTRAOP MICROELECTRODE RECORDING  04/2014  . COLONOSCOPY    . KNEE SURGERY     exploratory/left  . SKIN CANCER EXCISION  01/2014   face  . TRANSURETHRAL RESECTION OF PROSTATE     2008    His Family History Is Significant For: Family History  Problem Relation Age of Onset  . COPD Mother   . Cancer Father        Bladder  . Colon cancer Neg Hx   . Esophageal cancer Neg Hx   . Stomach cancer Neg Hx     His Social History Is Significant For: Social History   Socioeconomic History  . Marital status: Married    Spouse name: Levada Dy  . Number of children: 4  . Years of education: 75  . Highest education level: Not on file  Occupational History  . Occupation: LAWYER    Employer: [employer]  . Financial resource strain: Not on file  . Food insecurity    Worry: Not on file    Inability: Not on file  . Transportation needs    Medical: Not on file    Non-medical: Not on file  Tobacco Use  . Smoking status: Never Smoker  . Smokeless tobacco: Never Used  Substance and Sexual Activity  . Alcohol use: Yes    Alcohol/week: 5.0 standard drinks    Types: 3 Glasses of wine, 2 Cans of beer per week    Comment: occasional  . Drug use: No  . Sexual activity: Not on file  Lifestyle   . Physical activity    Days per week: Not on file    Minutes per session: Not on file  . Stress: Not on file  Relationships  . Social Herbalist on phone: Not on file    Gets together: Not on file    Attends religious service: Not on file    Active member of club or organization: Not on file    Attends meetings of clubs or organizations: Not on file    Relationship status: Not on file  Other Topics Concern  .  Not on file  Social History Narrative   Patient is right handed and resides with wife   1-2 cups of coffee a day     His Allergies Are:  No Known Allergies:   His Current Medications Are:  Outpatient Encounter Medications as of 11/01/2018  Medication Sig  . amantadine (SYMMETREL) 100 MG capsule   . carbidopa-levodopa (SINEMET IR) 25-100 MG tablet Take 1.5 tablets by mouth 5 (five) times daily.  . clonazePAM (KLONOPIN) 0.25 MG disintegrating tablet Take 1 tablet (0.25 mg total) by mouth at bedtime.  Marland Kitchen MELATONIN PO Take by mouth.  . Probiotic Product (PROBIOTIC PO) Take by mouth.  . Ropinirole HCl 6 MG TB24 2 (two) times daily.   Marland Kitchen VITAMIN D PO Take by mouth.   Facility-Administered Encounter Medications as of 11/01/2018  Medication  . 0.9 %  sodium chloride infusion  :  Review of Systems:  Out of a complete 14 point review of systems, all are reviewed and negative with the exception of these symptoms as listed below:  Review of Systems  Neurological:       Pt presents today to discuss his PD. Pt reports that he has had a few falls. Pt's DBS battery was replaced 5 weeks ago.    Objective:  Neurological Exam  Physical Exam Physical Examination:   Vitals:   11/01/18 1407  BP: 118/69  Pulse: 69   General Examination: The patient is a very pleasant 73 y.o. male in no acute distress. He appears well-developed and well-nourished and well groomed. Slender.   HEENT:Normocephalic, atraumatic, pupils are equal, round and reactive to light and  accommodation. Extraocular tracking shows mild saccadic breakdown without nystagmus noted. There is mildlimitation toupgaze, and no significant blepharospasmof the left eye.Neck is moderately rigid with neck tilt to the R, oropharynx exam reveals mild mouth dryness. No significant airway crowding is noted. Mallampati is class II. Tongue protrudes centrally and palate elevates symmetrically. There is no drooling. There are no dyskinesias. His stimulator wires are tunneled on the left side.   Chest:is clear to auscultation without wheezing, rhonchi or crackles noted. His generator site is not tender.   Heart:sounds are regular and normal without murmurs, rubs or gallops noted.   Abdomen:is soft, non-tender and non-distended with normal bowel sounds appreciated on auscultation.  Extremities:There isno obvious edema in the distal lower extremities bilaterally, some indentations at the sock lines.  Skin: is warm and dry with no trophic changes noted. Age-related changes are noted on the skin and scars from skin cancer removals.   Musculoskeletal: exam reveals no obvious joint deformities, tenderness, joint swelling or erythema, with the exception of low back pain reported and right shoulder pain. Upper body tilt to R, perhaps a little worse. Reduced ROM R hip.  Neurologically:  Mental status: The patient is awake and alert, paying good attention. He is able to completely provide the history. He is oriented to: person, place, time/date, situation, day of week, month of year and year. His memory, attention, language and knowledge areappropriate.There is no aphasia, agnosia, apraxia or anomia. There is amilddegree of bradyphrenia. Speech is mild to moderately hypophonic with minimal dysarthria noted. Mood is congruent and affect is normal.   Cranial nerves are as described above under HEENT exam. In addition, shoulder shrug is normal with equal shoulder height noted.  Motor exam:  Normal bulk, and strength for age is noted. There are no dyskinesias noted today.No resting tremor. Tone is mild to moderately rigid with presence of  cogwheeling in the right upper extremity. There is overall mild to moderate bradykinesia. There is no drift or rebound.  There in anintermittent myoclonus in the upper body.  Romberg is negative, with the exception of mild sway.  Fine motor skills exam: Fine motor skills are mild to moderately impaired, more pronounced on the R than L, but stable. Cerebellar testing shows no dysmetria or intention tremor. There is no truncal or gait ataxia.  Sensory exam is intact to light touch throughout.  Gait, station and balance: He stands up from the seated position with no significant difficulty and does not need to push up with His hands. He needs no assistance. No veering to one side is noted. He isnoted to lean to the right more and posture is mildly stooped. He walks withdecreased arm swing bilaterally. Balance is mildly impaired.   Assessment and Plan:   In summary, Ladarryl Wrage is a very pleasant85 year old male with an underlying medical history of skin cancer including melanoma, enlarged prostate (s/p TURP), who presents for followup consultation of his R sided predominant Parkinson's disease, complicated by RBD and sleep disturbance as well as mild dyskinesias. He is s/p bilateral DBS placements at [facility], s/p L IPG replacement on the L in 09/2018. He is On Sinemet, Requip long-acting and amantadine. He has had orthostatic hypotension, syncopal spells, falls with injuries in the past, constipation and bladder hyperactivity,and nows/p b/l DBS placement at [facility] on 06/01/14 and generator placement on 06/13/2014. Left-sided battery replacement/generator replacement in June 2020. Overall, he has done well,has had some interim issues with low back pain, and arthritis in his right shoulder, some sleep disturbance. He did have 2 distinct syncopal  spells that led to collapse, falls, and injuries.He sustained Fx of nose when he fell in his office, and then also fell in his driveway and chipped a tooth, needed a crown placed. He hadto have nose surgery. He saw a vascular specialist and had a tilt table test at [facility]. He had a24-hour blood pressure monitor test. Hewas started on amantadine for dyskinesias.He had a one month trial of Gocovri, but a Rx was cost prohibitive and he went back to amantadine twice a day. He tried to reduce it to once daily but found it not as effective.He isgetting regularBotox [facility]ph[facility].He used to be on Stalevo in the past but was able to stop this in or around early 2018. I suggested hecontinue with the current regimen. He also takes clonazepam namely 0.25 mg at night for his REM behavior disorder and nighttime sleep disruption. He tried melatonin. He is advised to continue with Sinemet at the current dose , 1-1/2 pills 5 times a day and Requip6 mg every evening.Unfortunately, he has had more balance problems and recent falls, thankfully without major injuries but he did bump his R hip, had a neg Xray. I suggested he try Voltaren gel on the sore area of the right hip.  If he does not improve in the next week or so, we can certainly look with an MRI of possible. We talked about the importance of ongoing exercise within limitations in social distancing of course.  He is advised to try more strength training and resistance training. I suggested a 6 month follow-up, sooner as needed.I answered all his questions today and the patient and his wife were in agreement. I spent 40 minutes in total face-to-face time with the patient, more than 50% of which was spent in counseling and coordination of  care, reviewing test results, reviewing medication and discussing or reviewing the diagnosis of PD, its prognosis and treatment options. Pertinent laboratory and imaging test results that were available  during this visit with the patient were reviewed by me and considered in my medical decision making (see chart for details).

## 2018-11-01 NOTE — Therapy (Signed)
[facility] [address] [place], [name], [address] Phone: [phone]   Fax:  [phone]  Occupational Therapy Treatment  Patient Details  Name: Ronald Noble MRN: [id number] Date of Birth: [birth year] Referring Provider (OT): Dr. Star Age   Encounter Date: 11/01/2018  OT End of Session - 11/01/18 1248    Visit Number  8    Number of Visits  17    Date for OT Re-Evaluation  11/04/18    Authorization Type  Medicare & BCBS, covered 100%    Authorization Time Period  cert period 06/06/52-5/62/56    Authorization - Visit Number  8    Authorization - Number of Visits  10    OT Start Time  1118    OT Stop Time  1200    OT Time Calculation (min)  42 min    Activity Tolerance  Patient tolerated treatment well    Behavior During Therapy  [facility] for tasks assessed/performed       Past Medical History:  Diagnosis Date  . BPH (benign prostatic hyperplasia)   . Parkinson disease ([place])   . Skin cancer of face    arms and back  . Sleep apnea    mild  . Spider bite    brown recluse/ left ankle/08/13    Past Surgical History:  Procedure Laterality Date  . BURR HOLE W/ STEREOTACTIC INSERTION OF DBS LEADS / INTRAOP MICROELECTRODE RECORDING  04/2014  . COLONOSCOPY    . KNEE SURGERY     exploratory/left  . SKIN CANCER EXCISION  01/2014   face  . TRANSURETHRAL RESECTION OF PROSTATE     2008    There were no vitals filed for this visit.  Subjective Assessment - 11/01/18 1246    Subjective   Pt reports continued hip pain    Pertinent History  PD diagnosed in 2008, bilateral DBS 2016    Limitations  fall risk, bilateral DBS    Patient Stated Goals  improve coordination    Pain Score  4     Pain Location  Hip    Pain Orientation  Right    Pain Descriptors / Indicators  Aching    Pain Onset  More than a month ago    Pain Frequency  Intermittent    Aggravating Factors   standing    Pain Relieving Factors   sitting              LINIC OPERATION CHANGES: Outpatient Neuro Rehab is open at lower capacity following universal masking, social distancing, and patient screening.  The patient's COVID risk of complications score is 3. Arm bike x 6 mins level 1 for conditioning, pt maintained>40 rpm Reviewed strategies for fastening buttons, and pt met , short term goal. Therapist checked progress towards short term goals. Placing grooved pegs into pegboard with right and removing with LUE for fine motor coordination, min-mod v.c for upright posture, to avoid leaning to right side, pt only sustained for 2 mins prior to cueing.              OT Education - 11/01/18 1252    Education Details  PWR! moves standing (with chair or countertop in front of pt.) 10-20 reps each    Person(s) Educated  Patient    Methods  Explanation;Demonstration;Verbal cues;Handout    Comprehension  Verbalized understanding;Returned demonstration;Verbal cues required       OT Short Term Goals - 11/01/18 1123      OT  SHORT TERM GOAL #1   Title  Pt will be independent with updated PD specific HEP--check STGs 10/05/18    Time  4    Period  Weeks    Status  On-going   needs reinforcement     OT SHORT TERM GOAL #2   Title  Pt will be able to sustain upright posture for at least 5 min during seated functional task for incr ease using mouse and incr safety for driving.    Baseline  leans to the R and uses RUE support    Time  4    Period  Weeks    Status  On-going      OT SHORT TERM GOAL #3   Title  Pt will demo improved coordination/functional reaching for ADLs as shown by improving score on box and blocks test by at least 3 bilaterally.    Baseline  R-49 blocks, L-43 blocks    Time  4    Period  Weeks    Status  Achieved   LUE 55, RUE 66     OT SHORT TERM GOAL #4   Title  Pt will be able to fasten/unfasten 3 buttons in 55sec or less for improved bilateral hand coordination/incr ease with dressing.     Baseline  64.34sec    Time  4    Period  Weeks    Status  Achieved   52 secs     OT SHORT TERM GOAL #5   Title  -----    Baseline  ---        OT Long Term Goals - 09/05/18 1131      OT LONG TERM GOAL #1   Title  Pt will verbalize understanding of adaptive strategies to incr safety/ease with ADL's/IADLS's--LTGs due 7/62/83    Time  8    Period  Weeks    Status  New      OT LONG TERM GOAL #2   Title  Pt will improve bilateral functional reach and balance as shown by improving standing functional reach by at least 2" bilaterally.    Baseline  RUE 7.5, LUE 8.5    Time  8    Period  Weeks    Status  New   11 inches     OT LONG TERM GOAL #3   Title  Pt will be able to sustain upright posture for at least 8 min during seated functional task for incr ease using mouse and incr safety for driving.      OT LONG TERM GOAL #4   Title  Pt will demo improved coordination/functional reaching for ADLs as shown by improving score on box and blocks test by at least 6 bilaterally.    Baseline  R-49, L-43 blocks    Time  8    Period  Weeks    Status  New   135 shoulder flexion, -5     OT LONG TERM GOAL #5   Title  ------            Plan - 11/01/18 1528    Clinical Impression Statement  Pt is progressing towards goals. He achieved 2/3 short term goals.    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  PMH: PD diagnosed in 2008, s/p bilateral DBS, skin cancer, sleep apnea, BPH, TURP, orthostatic hypotension, syncopal episodes; arthritic changes  in spine with hx of back pain    Occupational performance deficits (Please refer to evaluation for details):  ADL's;IADL's;Work;Leisure;Social Participation    Body Structure / Function / Physical Skills  ADL;Coordination;Endurance;UE functional use;GMC;Decreased knowledge of  precautions;Balance;Decreased knowledge of use of DME;Flexibility;IADL;Pain;FMC;Dexterity;Mobility;Gait;ROM;Tone    Cognitive Skills  Attention;Safety Awareness;Memory    Rehab Potential  Good    OT Frequency  2x / week    OT Duration  8 weeks    OT Treatment/Interventions  Self-care/ADL training;Therapeutic exercise;Patient/family education;Moist Heat;Energy conservation;Therapist, nutritional;Therapeutic activities;Balance training;Cognitive remediation/compensation;Passive range of motion;Manual Therapy;DME and/or AE instruction;Cryotherapy;Fluidtherapy;Aquatic Therapy;Neuromuscular education    Plan  Review PWR! standing, work on posture and shoulder ROM, functional reach, ADL strategies,    Recommended Other Services  physical therapy due to falls/changes in gait    Consulted and Agree with Plan of Care  Patient       Patient will benefit from skilled therapeutic intervention in order to improve the following deficits and impairments:   Body Structure / Function / Physical Skills: ADL, Coordination, Endurance, UE functional use, GMC, Decreased knowledge of precautions, Balance, Decreased knowledge of use of DME, Flexibility, IADL, Pain, FMC, Dexterity, Mobility, Gait, ROM, Tone Cognitive Skills: Attention, Safety Awareness, Memory     Visit Diagnosis: 1. Other symptoms and signs involving the nervous system   2. Abnormal posture   3. Other symptoms and signs involving the musculoskeletal system   4. Other lack of coordination   5. Stiffness of right shoulder, not elsewhere classified   6. Attention and concentration deficit   7. Muscle weakness (generalized)   8. Stiffness of left shoulder, not elsewhere classified       Problem List Patient Active Problem List   Diagnosis Date Noted  . Parkinson disease ([place])     Ronald Noble 11/01/2018, 3:30 PM  [facility] [address] [employer], [name],  [address] Phone: [phone]   Fax:  [phone]  Name: Ronald Noble MRN: [id number] Date of Birth: [birth year]

## 2018-11-03 ENCOUNTER — Ambulatory Visit: Payer: Medicare Other | Admitting: Physical Therapy

## 2018-11-03 ENCOUNTER — Encounter: Payer: Self-pay | Admitting: Occupational Therapy

## 2018-11-03 ENCOUNTER — Ambulatory Visit: Payer: Medicare Other | Admitting: Occupational Therapy

## 2018-11-03 ENCOUNTER — Other Ambulatory Visit: Payer: Self-pay

## 2018-11-03 ENCOUNTER — Encounter: Payer: Self-pay | Admitting: Physical Therapy

## 2018-11-03 DIAGNOSIS — R293 Abnormal posture: Secondary | ICD-10-CM

## 2018-11-03 DIAGNOSIS — R278 Other lack of coordination: Secondary | ICD-10-CM

## 2018-11-03 DIAGNOSIS — M25612 Stiffness of left shoulder, not elsewhere classified: Secondary | ICD-10-CM | POA: Diagnosis not present

## 2018-11-03 DIAGNOSIS — R2689 Other abnormalities of gait and mobility: Secondary | ICD-10-CM | POA: Diagnosis not present

## 2018-11-03 DIAGNOSIS — R41840 Attention and concentration deficit: Secondary | ICD-10-CM

## 2018-11-03 DIAGNOSIS — Z9181 History of falling: Secondary | ICD-10-CM

## 2018-11-03 DIAGNOSIS — R29818 Other symptoms and signs involving the nervous system: Secondary | ICD-10-CM

## 2018-11-03 DIAGNOSIS — M25611 Stiffness of right shoulder, not elsewhere classified: Secondary | ICD-10-CM

## 2018-11-03 DIAGNOSIS — R29898 Other symptoms and signs involving the musculoskeletal system: Secondary | ICD-10-CM

## 2018-11-03 DIAGNOSIS — R2681 Unsteadiness on feet: Secondary | ICD-10-CM

## 2018-11-03 NOTE — Therapy (Signed)
[facility] [address] [place], [name], [address] Phone: [phone]   Fax:  [phone]  Occupational Therapy Treatment  Patient Details  Name: Ronald Noble MRN: [id number] Date of Birth: Oct 03, 1945 Referring Provider (OT): Dr. Star Age   Encounter Date: 11/03/2018  OT End of Session - 11/03/18 0722    Visit Number  9    Number of Visits  17    Date for OT Re-Evaluation  11/04/18    Authorization Type  Medicare & BCBS, covered 100%    Authorization Time Period  cert period 07/07/13-4/00/86    Authorization - Visit Number  9    Authorization - Number of Visits  10    OT Start Time  0706    OT Stop Time  0750    OT Time Calculation (min)  44 min    Activity Tolerance  Patient tolerated treatment well    Behavior During Therapy  [facility] for tasks assessed/performed       Past Medical History:  Diagnosis Date  . BPH (benign prostatic hyperplasia)   . Parkinson disease ([place])   . Skin cancer of face    arms and back  . Sleep apnea    mild  . Spider bite    brown recluse/ left ankle/08/13    Past Surgical History:  Procedure Laterality Date  . BURR HOLE W/ STEREOTACTIC INSERTION OF DBS LEADS / INTRAOP MICROELECTRODE RECORDING  04/2014  . COLONOSCOPY    . KNEE SURGERY     exploratory/left  . SKIN CANCER EXCISION  01/2014   face  . TRANSURETHRAL RESECTION OF PROSTATE     2008    There were no vitals filed for this visit.  Subjective Assessment - 11/03/18 0720    Subjective   Pt reports discomfort only, no pain.  Pt reports that he has been swimming at dtr's pool in the mornings for 45min for cardio    Pertinent History  PD diagnosed in 2008, bilateral DBS 2016    Limitations  fall risk, bilateral DBS    Patient Stated Goals  improve coordination    Currently in Pain?  No/denies    Pain Onset  More than a month ago        CLINIC OPERATION CHANGES: Outpatient Neuro Rehab is open at lower  capacity following universal masking, social distancing, and patient screening.  The patient's COVID risk of complications score is 3.   PWR! Moves (up, rock, twist) in supine x 20 each with min cues For incr movement amplitude.   PWR! rock in sitting x 20 with min cues For incr movement amplitude. PWR! Moves (basic 4) in quadruped x 20 each with min cues For incr movement amplitude and posture. And used mirror for visual cueing for posture for up and step.    In supine, shoulder flex closed-chain with ball with BUEs for stretch with min v.c.  Completing [employer] Pegboard with BUEs simultaneously for incr coordination and posture for bilateral task.  Min v.c. and mirror utilized for posture and min-mod v.c. to stop frequently and stretch hands.           OT Short Term Goals - 11/01/18 1123      OT SHORT TERM GOAL #1   Title  Pt will be independent with updated PD specific HEP--check STGs 10/05/18    Time  4    Period  Weeks    Status  On-going   needs reinforcement  OT SHORT TERM GOAL #2   Title  Pt will be able to sustain upright posture for at least 5 min during seated functional task for incr ease using mouse and incr safety for driving.    Baseline  leans to the R and uses RUE support    Time  4    Period  Weeks    Status  On-going      OT SHORT TERM GOAL #3   Title  Pt will demo improved coordination/functional reaching for ADLs as shown by improving score on box and blocks test by at least 3 bilaterally.    Baseline  R-49 blocks, L-43 blocks    Time  4    Period  Weeks    Status  Achieved   LUE 55, RUE 66     OT SHORT TERM GOAL #4   Title  Pt will be able to fasten/unfasten 3 buttons in 55sec or less for improved bilateral hand coordination/incr ease with dressing.    Baseline  64.34sec    Time  4    Period  Weeks    Status  Achieved   52 secs     OT SHORT TERM GOAL #5   Title  -----    Baseline  ---        OT Long Term Goals - 09/05/18 1131      OT  LONG TERM GOAL #1   Title  Pt will verbalize understanding of adaptive strategies to incr safety/ease with ADL's/IADLS's--LTGs due 3/55/73    Time  8    Period  Weeks    Status  New      OT LONG TERM GOAL #2   Title  Pt will improve bilateral functional reach and balance as shown by improving standing functional reach by at least 2" bilaterally.    Baseline  RUE 7.5, LUE 8.5    Time  8    Period  Weeks    Status  New   11 inches     OT LONG TERM GOAL #3   Title  Pt will be able to sustain upright posture for at least 8 min during seated functional task for incr ease using mouse and incr safety for driving.      OT LONG TERM GOAL #4   Title  Pt will demo improved coordination/functional reaching for ADLs as shown by improving score on box and blocks test by at least 6 bilaterally.    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  PMH: PD diagnosed in 2008, s/p bilateral DBS, skin cancer, sleep apnea, BPH, TURP, orthostatic hypotension, syncopal episodes; arthritic changes in spine with hx of back pain    Occupational performance deficits (Please refer to evaluation for details):  ADL's;IADL's;Work;Leisure;Social Participation    Body Structure / Function / Physical Skills  ADL;Coordination;Endurance;UE functional use;GMC;Decreased knowledge of precautions;Balance;Decreased knowledge of use of DME;Flexibility;IADL;Pain;FMC;Dexterity;Mobility;Gait;ROM;Tone    Cognitive Skills  Attention;Safety Awareness;Memory    Rehab Potential  Good    OT  Frequency  2x / week    OT Duration  8 weeks    OT Treatment/Interventions  Self-care/ADL training;Therapeutic  exercise;Patient/family education;Moist Heat;Energy conservation;Therapist, nutritional;Therapeutic activities;Balance training;Cognitive remediation/compensation;Passive range of motion;Manual Therapy;DME and/or AE instruction;Cryotherapy;Fluidtherapy;Aquatic Therapy;Neuromuscular education    Plan  Review PWR! standing, work on posture and shoulder ROM, functional reach, ADL strategies,    Recommended Other Services  physical therapy due to falls/changes in gait    Consulted and Agree with Plan of Care  Patient       Patient will benefit from skilled therapeutic intervention in order to improve the following deficits and impairments:   Body Structure / Function / Physical Skills: ADL, Coordination, Endurance, UE functional use, GMC, Decreased knowledge of precautions, Balance, Decreased knowledge of use of DME, Flexibility, IADL, Pain, FMC, Dexterity, Mobility, Gait, ROM, Tone Cognitive Skills: Attention, Safety Awareness, Memory     Visit Diagnosis: 1. Other symptoms and signs involving the nervous system   2. Abnormal posture   3. Other symptoms and signs involving the musculoskeletal system   4. Other lack of coordination   5. Stiffness of right shoulder, not elsewhere classified   6. Attention and concentration deficit   7. Stiffness of left shoulder, not elsewhere classified   8. Unsteadiness on feet   9. Other abnormalities of gait and mobility       Problem List Patient Active Problem List   Diagnosis Date Noted  . Parkinson disease ([place])     [facility] 11/03/2018, 8:01 AM  [facility] [address] [place], [name], [address] Phone: [phone]   Fax:  [phone]  Name: Camryn Quesinberry MRN: [id number] Date of Birth: 1945/07/11   Vianne Bulls, OTR/L [facility] [address] [place], [place]  [address] [phone] phone [phone] 11/03/18 8:01  AM

## 2018-11-03 NOTE — Therapy (Signed)
[facility] [address] [place], [name], [address] Phone: [phone]   Fax:  [phone]  Physical Therapy Treatment  Patient Details  Name: Ronald Noble MRN: [id number] Date of Birth: 1945-12-29 Referring Provider (PT): Dr. Rexene Alberts, [facility]  CLINIC OPERATION CHANGES: Outpatient Neuro Rehab is open at lower capacity following universal masking, social distancing, and patient screening.  The patient's COVID risk of complications score is 3.  Encounter Date: 11/03/2018  PT End of Session - 11/03/18 0901    Visit Number  2    Number of Visits  17    Date for PT Re-Evaluation  12/19/18    Authorization Type  Medicare & BCBS - 10th visit PN    PT Start Time  0803    PT Stop Time  0847    PT Time Calculation (min)  44 min    Activity Tolerance  Patient tolerated treatment well    Behavior During Therapy  [facility] for tasks assessed/performed       Past Medical History:  Diagnosis Date  . BPH (benign prostatic hyperplasia)   . Parkinson disease ([place])   . Skin cancer of face    arms and back  . Sleep apnea    mild  . Spider bite    brown recluse/ left ankle/08/13    Past Surgical History:  Procedure Laterality Date  . BURR HOLE W/ STEREOTACTIC INSERTION OF DBS LEADS / INTRAOP MICROELECTRODE RECORDING  04/2014  . COLONOSCOPY    . KNEE SURGERY     exploratory/left  . SKIN CANCER EXCISION  01/2014   face  . TRANSURETHRAL RESECTION OF PROSTATE     2008    There were no vitals filed for this visit.  Subjective Assessment - 11/03/18 0759    Subjective  Pt reports he has started swimming for 20-30 minutes in his son's pool and thinks that has helped.  Still having a tendency to go too fast with gait and loosing balance forward.  Pt was in the yard working on [employer] when he had his last fall.  R hip xray was negative.  Reports it was not a controlled fall.  Still having some hip pain.    Pertinent History  PD  2008, bilateral DBS (2018)    Patient Stated Goals  wants to improve his flexibility and posture, strengthen legs, walking    Pain Score  2    Reports none at rest while sitting on our mat but increased to a 2/10 with standing and 4/10 with gait   Pain Location  Hip    Pain Orientation  Right    Pain Descriptors / Indicators  Aching    Pain Type  Acute pain    Pain Onset  1 to 4 weeks ago    Pain Frequency  Other (Comment)   varies based on position   Aggravating Factors   walking and weight bearing    Pain Relieving Factors  swimming           PWR [facility]) - 11/03/18 0825    PWR! exercises  Moves in sitting;Moves in prone;Moves in standing    PWR! Up  Prone x 15    PWR! Rock  x 20    PWR! Twist  x 15 each side    PWR! Step  x 10 each side    Comments  Modified PWR! step to single arm step out due to decreased ability to stay stable at shoulder with both  arms stepping out.  Also modified twist by increasing hip and knee flexion while sidelying to decrease pain/stretch in hips.    PWR! Up  Standing x 20    PWR! Rock  x 20    PWR! Twist  x 20    PWR Step  x20    Comments  Used mirror for visual cues.  Did not need UE support but encouraged to use chair or counter at home for support if feeling off balanced.    PWR! Up  In sitting x 20    PWR! Rock  x 20    PWR! Twist  x 20    PWR! Step  x 20    Comments  used mirror for visual feedback along with cues for intensity and technique          PT Education - 11/03/18 0859    Education Details  PWR! moves in prone, sitting and standing;counting or using a metronome for gait speed and will trial next session    Person(s) Educated  Patient    Methods  Explanation;Demonstration    Comprehension  Verbalized understanding;Returned demonstration       PT Short Term Goals - 10/20/18 1317      PT SHORT TERM GOAL #1   Title  Patient will be independent with initial HEP for balance and strengthening in order to build upon functional  gains in therapy. ALL STG TARGET DATE 11/19/18    Time  4    Period  Weeks    Status  New    Target Date  11/19/18      PT SHORT TERM GOAL #2   Title  Patient will verbalize fall prevention strategies in the home in order to decrease risk of future falls.    Time  4    Period  Weeks    Status  New      PT SHORT TERM GOAL #3   Title  Patient will improve miniBEST score to at least a 22/28 in order to demonstrate decreased fall risk.    Baseline  20/28 on 10/20/18    Time  4    Period  Weeks    Status  New      PT SHORT TERM GOAL #4   Title  Patient will perform 5 x sit to stand from 18" chair with no UE support demonstrating no posterior LOB.    Baseline  episode of posterior LOB and decreased eccentric control when lowering    Time  4    Period  Weeks    Status  New        PT Long Term Goals - 10/20/18 1320      PT LONG TERM GOAL #1   Title  Patient will be independent with final HEP in order to build upon functional gains made in therapy. ALL TARGET LTG DATE 12/19/18    Time  8    Period  Weeks    Status  New    Target Date  12/19/18      PT LONG TERM GOAL #2   Title  Patient will improve miniBEST score to at least a 24/28 in order to decrease risk of future falls.    Baseline  20/28 on 10/20/18    Time  8    Period  Weeks    Status  New      PT LONG TERM GOAL #3   Title  Patient will ambulate at least 29' with increased reciprocal  arm swing and larger step lengths with no verbal cueing from therapist in order to improve gait mechanics.    Time  8    Period  Weeks    Status  New      PT LONG TERM GOAL #4   Title  Patient will demonstrate fluid gait with walking sticks mod I with no verbal cueing in order to increase safety while ambulating in his neighborhood.    Time  8    Period  Weeks    Status  New      PT LONG TERM GOAL #5   Title  Patient will perform sit to stand from <18" chair with no UE support and no posterior LOB upon standing in order to increase  safety.    Time  8    Period  Weeks    Status  New      Additional Long Term Goals   Additional Long Term Goals  Yes      PT LONG TERM GOAL #6   Title  Patient will ambulate 1,000' outside over uneven surfaces/inclines with no AD/LRAD and mod I in order to return to prior level of fitness in the community/neighborhood.    Time  8    Period  Weeks    Status  New            Plan - 11/03/18 0901    Clinical Impression Statement  Skilled session focused on PWR! moves as part of HEP.  Pt has been issued PWR! moves in the past.  Pt needs cues for intensity, techniqe and some modifications due to R hip pain.  Continue PT per POC.    Personal Factors and Comorbidities  Comorbidity 1;Past/Current Experience    Comorbidities  PD    Examination-Activity Limitations  Locomotion Level    Examination-Participation Restrictions  Other   Exercise (previously liked to jog)   Stability/Clinical Decision Making  Evolving/Moderate complexity    Rehab Potential  Good    PT Frequency  2x / week    PT Duration  8 weeks    PT Treatment/Interventions  ADLs/Self Care Home Management;Therapeutic exercise;Therapeutic activities;Functional mobility training;Stair training;Gait training;DME Instruction;Balance training;Neuromuscular re-education;Patient/family education    PT Next Visit Plan  Gait training with trial of metronome for gait speed. Review PWR! moves. Educate patient on fall prevention in the home.       Patient will benefit from skilled therapeutic intervention in order to improve the following deficits and impairments:  Abnormal gait, Decreased balance, Decreased coordination, Decreased range of motion, Decreased mobility, Decreased strength, Difficulty walking, Postural dysfunction, Decreased activity tolerance  Visit Diagnosis: 1. Other symptoms and signs involving the musculoskeletal system   2. Other lack of coordination   3. Abnormal posture   4. Unsteadiness on feet   5. History of  falling        Problem List Patient Active Problem List   Diagnosis Date Noted  . Parkinson disease ([place])     Narda Bonds, [place] 11/03/18 9:04 AM Phone: [phone] Fax: [facility] [address] [place], [name], [address] Phone: [phone]   Fax:  [phone]  Name: Phinehas Grounds MRN: [id number] Date of Birth: January 02, 1946

## 2018-11-11 ENCOUNTER — Encounter: Payer: Self-pay | Admitting: Physical Therapy

## 2018-11-11 ENCOUNTER — Ambulatory Visit: Payer: Medicare Other | Admitting: Occupational Therapy

## 2018-11-11 ENCOUNTER — Other Ambulatory Visit: Payer: Self-pay

## 2018-11-11 ENCOUNTER — Ambulatory Visit: Payer: Medicare Other | Attending: Internal Medicine | Admitting: Physical Therapy

## 2018-11-11 DIAGNOSIS — R29898 Other symptoms and signs involving the musculoskeletal system: Secondary | ICD-10-CM | POA: Diagnosis not present

## 2018-11-11 DIAGNOSIS — R2681 Unsteadiness on feet: Secondary | ICD-10-CM | POA: Diagnosis not present

## 2018-11-11 DIAGNOSIS — R29818 Other symptoms and signs involving the nervous system: Secondary | ICD-10-CM

## 2018-11-11 DIAGNOSIS — R41840 Attention and concentration deficit: Secondary | ICD-10-CM | POA: Diagnosis not present

## 2018-11-11 DIAGNOSIS — M6281 Muscle weakness (generalized): Secondary | ICD-10-CM

## 2018-11-11 DIAGNOSIS — M25612 Stiffness of left shoulder, not elsewhere classified: Secondary | ICD-10-CM

## 2018-11-11 DIAGNOSIS — R2689 Other abnormalities of gait and mobility: Secondary | ICD-10-CM | POA: Insufficient documentation

## 2018-11-11 DIAGNOSIS — M25611 Stiffness of right shoulder, not elsewhere classified: Secondary | ICD-10-CM

## 2018-11-11 DIAGNOSIS — R293 Abnormal posture: Secondary | ICD-10-CM | POA: Diagnosis not present

## 2018-11-11 DIAGNOSIS — R278 Other lack of coordination: Secondary | ICD-10-CM

## 2018-11-11 NOTE — Therapy (Addendum)
[facility] [address] [place], [name], [address] Phone: [phone]   Fax:  [phone]  Physical Therapy Treatment  Patient Details  Name: Ronald Noble MRN: [id number] Date of Birth: 1946-01-20 Referring Provider (PT): Dr. Star Age   Encounter Date: 11/11/2018  CLINIC OPERATION CHANGES: [facility]patient [facility] is open at lower capacity following universal masking, social distancing, and patient screening.  The patient's COVID risk of complications score is 3.   PT End of Session - 11/11/18 1322    Visit Number  3    Number of Visits  17    Date for PT Re-Evaluation  12/19/18    Authorization Type  Medicare & BCBS - 10th visit PN    PT Start Time  0802    PT Stop Time  0848    PT Time Calculation (min)  46 min    Activity Tolerance  Patient tolerated treatment well    Behavior During Therapy  WFL for tasks assessed/performed       Past Medical History:  Diagnosis Date  . BPH (benign prostatic hyperplasia)   . Parkinson disease ([place])   . Skin cancer of face    arms and back  . Sleep apnea    mild  . Spider bite    brown recluse/ left ankle/08/13    Past Surgical History:  Procedure Laterality Date  . BURR HOLE W/ STEREOTACTIC INSERTION OF DBS LEADS / INTRAOP MICROELECTRODE RECORDING  04/2014  . COLONOSCOPY    . KNEE SURGERY     exploratory/left  . SKIN CANCER EXCISION  01/2014   face  . TRANSURETHRAL RESECTION OF PROSTATE     2008    There were no vitals filed for this visit.  Subjective Assessment - 11/11/18 0804    Subjective  Been doing swimming in son's pool and that has been helping.  Still find myself accelerating forward and usually have to hold door or wall to stop.    Pertinent History  PD 2008, bilateral DBS (2018)    Patient Stated Goals  wants to improve his flexibility and posture, strengthen legs, walking    Currently in Pain?  No/denies    Pain Onset  1 to 4 weeks ago            Neuro Re-education Pt performs PWR! Moves in seated position x 20 reps, using mirror for visual cues.  Then pt performs PWR! Moves in standing, x 10 reps PWR! Up, x 20 reps PWR! [place], [place]! Twist, PWR! Step.  Pt uses mirror for visual cues for postural feedback.   PWR! Up for improved posture  PWR! Rock for improved weighshifting  PWR! Twist for improved trunk rotation   PWR! Step for improved step initiation   Initial cues provided for pacing and deliberate intensity of movement patterns.               [facility] Adult PT Treatment/Exercise - 11/11/18 0812      Transfers   Transfers  Sit to Stand;Stand to Sit    Sit to Stand  5: Supervision;Without upper extremity assist;From bed    Sit to Stand Details  Verbal cues for technique;Verbal cues for sequencing    Sit to Stand Details (indicate cue type and reason)  Cues for slowed pace, forward lean, full foot placement on floor (to avoid leaning back on heels) and upright stand    Stand to Sit  5: Supervision    Stand to Sit Details (  indicate cue type and reason)  Verbal cues for sequencing;Verbal cues for technique    Stand to Sit Details  Cues for slowed pace, "hinged" at hips, slowed squat to sit    Number of Reps  Other reps (comment)   15 reps plus additional reps throughout session   Transfer Cueing  Provided cues and benefits for functional strengthening with optimal technqiue for transfers.    Comments  Also practiced sit<>stand technique from rolling office chair (to simulate chair that he has in office)      Ambulation/Gait   Ambulation/Gait  Yes    Ambulation/Gait Assistance  5: Supervision;6: Modified independent (Device/Increase time)    Ambulation/Gait Assistance Details  Cues for slowed pace, upright posture, reciprocal arm swing.  As pt begins to have increased forward posture, PT provides tactile cues and VCs to STOP and reset posture and step length.  Pt responds well to visual cues of mirro     Ambulation Distance (Feet)  345 Feet   115   Assistive device  None    Gait Pattern  Decreased arm swing - right;Shuffle;Lateral trunk lean to right;Decreased trunk rotation;Trunk flexed;Narrow base of support;Decreased step length - left;Decreased step length - right      Neuro Re-ed    Neuro Re-ed Details   Stagger stance forward/back weightshifting to focus on tall posture and weightshifting through hips, added single arm swing with weightshifting             PT Education - 11/11/18 1322    Education Details  Transfer technique/training for safe transfers-see instructions    Person(s) Educated  Patient    Methods  Explanation;Demonstration;Verbal cues;Handout    Comprehension  Verbalized understanding;Returned demonstration;Verbal cues required       PT Short Term Goals - 10/20/18 1317      PT SHORT TERM GOAL #1   Title  Patient will be independent with initial HEP for balance and strengthening in order to build upon functional gains in therapy. ALL STG TARGET DATE 11/19/18    Time  4    Period  Weeks    Status  New    Target Date  11/19/18      PT SHORT TERM GOAL #2   Title  Patient will verbalize fall prevention strategies in the home in order to decrease risk of future falls.    Time  4    Period  Weeks    Status  New      PT SHORT TERM GOAL #3   Title  Patient will improve miniBEST score to at least a 22/28 in order to demonstrate decreased fall risk.    Baseline  20/28 on 10/20/18    Time  4    Period  Weeks    Status  New      PT SHORT TERM GOAL #4   Title  Patient will perform 5 x sit to stand from 18" chair with no UE support demonstrating no posterior LOB.    Baseline  episode of posterior LOB and decreased eccentric control when lowering    Time  4    Period  Weeks    Status  New        PT Long Term Goals - 10/20/18 1320      PT LONG TERM GOAL #1   Title  Patient will be independent with final HEP in order to build upon functional gains made in  therapy. ALL TARGET LTG DATE 12/19/18    Time  8    Period  Weeks    Status  New    Target Date  12/19/18      PT LONG TERM GOAL #2   Title  Patient will improve miniBEST score to at least a 24/28 in order to decrease risk of future falls.    Baseline  20/28 on 10/20/18    Time  8    Period  Weeks    Status  New      PT LONG TERM GOAL #3   Title  Patient will ambulate at least 64' with increased reciprocal arm swing and larger step lengths with no verbal cueing from therapist in order to improve gait mechanics.    Time  8    Period  Weeks    Status  New      PT LONG TERM GOAL #4   Title  Patient will demonstrate fluid gait with walking sticks mod I with no verbal cueing in order to increase safety while ambulating in his neighborhood.    Time  8    Period  Weeks    Status  New      PT LONG TERM GOAL #5   Title  Patient will perform sit to stand from <18" chair with no UE support and no posterior LOB upon standing in order to increase safety.    Time  8    Period  Weeks    Status  New      Additional Long Term Goals   Additional Long Term Goals  Yes      PT LONG TERM GOAL #6   Title  Patient will ambulate 1,000' outside over uneven surfaces/inclines with no AD/LRAD and mod I in order to return to prior level of fitness in the community/neighborhood.    Time  8    Period  Weeks    Status  New            Plan - 11/11/18 1332    Clinical Impression Statement  Skilled PT session today focused on timing and coordination of transfers and gait, as well as PWR! Moves in sitting and standing to address posture, weightshifting, trunk flexibility and initial stepping.  Pt responds well to mirror for visual cues, as he uses mirror to self-correct posture throughout exercises.  He has overall slowed, more deliberate movement patterns throughout session, and responds well to explanation that correct sit<>stand technique will help with functional strengthening of HEP.    Personal  Factors and Comorbidities  Comorbidity 1;Past/Current Experience    Comorbidities  PD    Examination-Activity Limitations  Locomotion Level    Examination-Participation Restrictions  Other   Exercise (previously liked to jog)   Stability/Clinical Decision Making  Evolving/Moderate complexity    Rehab Potential  Good    PT Frequency  2x / week    PT Duration  8 weeks    PT Treatment/Interventions  ADLs/Self Care Home Management;Therapeutic exercise;Therapeutic activities;Functional mobility training;Stair training;Gait training;DME Instruction;Balance training;Neuromuscular re-education;Patient/family education    PT Next Visit Plan  Review sit<>stand technique; gait training with slowed pace, use of mirror for visual feedback and timing; may try metronome for timing of gait; standing exercises to increase step length; fall prevention in home envrnionment.    Consulted and Agree with Plan of Care  Patient       Patient will benefit from skilled therapeutic intervention in order to improve the following deficits and impairments:  Abnormal gait, Decreased balance, Decreased coordination, Decreased range of  motion, Decreased mobility, Decreased strength, Difficulty walking, Postural dysfunction, Decreased activity tolerance  Visit Diagnosis: 1. Unsteadiness on feet   2. Abnormal posture   3. Other symptoms and signs involving the nervous system   4. Other abnormalities of gait and mobility        Problem List Patient Active Problem List   Diagnosis Date Noted  . Parkinson disease ([place])     Jenevieve Kirschbaum W. 11/11/2018, 1:41 PM  Frazier Butt., PT  [facility] [address] [place], [name], [address] Phone: [phone]   Fax:  [phone]  Name: Ronald Noble MRN: [id number] Date of Birth: 07/28/1945

## 2018-11-11 NOTE — Therapy (Addendum)
[facility] [address] [place], [name], [address] Phone: [phone]   Fax:  [phone]  Occupational Therapy Treatment  Patient Details  Name: Ronald Noble MRN: [id number] Date of Birth: 05-11-1945 Referring Provider (OT): Dr. Star Age   Encounter Date: 11/11/2018  OT End of Session - 11/11/18 0727    Visit Number  10    Number of Visits  17    Date for OT Re-Evaluation  11/04/18    Authorization Type  Medicare & BCBS, covered 100%    Authorization Time Period  cert period 0/5/39-7/67/34    Authorization - Visit Number  10    Authorization - Number of Visits  10    OT Start Time  0720    OT Stop Time  0800    OT Time Calculation (min)  40 min    Activity Tolerance  Patient tolerated treatment well    Behavior During Therapy  [facility] for tasks assessed/performed       Past Medical History:  Diagnosis Date  . BPH (benign prostatic hyperplasia)   . Parkinson disease ([place])   . Skin cancer of face    arms and back  . Sleep apnea    mild  . Spider bite    brown recluse/ left ankle/08/13    Past Surgical History:  Procedure Laterality Date  . BURR HOLE W/ STEREOTACTIC INSERTION OF DBS LEADS / INTRAOP MICROELECTRODE RECORDING  04/2014  . COLONOSCOPY    . KNEE SURGERY     exploratory/left  . SKIN CANCER EXCISION  01/2014   face  . TRANSURETHRAL RESECTION OF PROSTATE     2008    There were no vitals filed for this visit.           Treatment: Reviewed PWR! Moves supine, prone and quadraped and issued handouts. Discussed importance of performing at home Arm bike x 5 mins level 1 for conditioning pt maintained >40 rpm/             OT Education - 11/11/18 1515    Education Details  PWR! supine, prone, and quadraped 10 reps each, min v.c for larger amplitude movements, handouts issued.    Person(s) Educated  Patient    Methods  Explanation;Handout;Demonstration;Tactile cues    Comprehension  Verbalized understanding;Returned demonstration;Verbal cues required       OT Short Term Goals - 11/11/18 0739      OT SHORT TERM GOAL #1   Title  Pt will be independent with updated PD specific HEP--check STGs 10/05/18    Time  4    Period  Weeks    Status  Achieved   needs reinforcement     OT SHORT TERM GOAL #2   Title  Pt will be able to sustain upright posture for at least 5 min during seated functional task for incr ease using mouse and incr safety for driving.    Baseline  leans to the R and uses RUE support    Time  4    Period  Weeks    Status  On-going   not consistent     OT SHORT TERM GOAL #3   Title  Pt will demo improved coordination/functional reaching for ADLs as shown by improving score on box and blocks test by at least 3 bilaterally.    Baseline  R-49 blocks, L-43 blocks    Time  4    Period  Weeks    Status  Achieved  LUE 55, RUE 66     OT SHORT TERM GOAL #4   Title  Pt will be able to fasten/unfasten 3 buttons in 55sec or less for improved bilateral hand coordination/incr ease with dressing.    Baseline  64.34sec    Time  4    Period  Weeks    Status  Achieved   52 secs     OT SHORT TERM GOAL #5   Title  -----    Baseline  ---        OT Long Term Goals - 11/11/18 0739      OT LONG TERM GOAL #1   Title  Pt will verbalize understanding of adaptive strategies to incr safety/ease with ADL's/IADLS's--LTGs due 8/54/62    Time  8    Period  Weeks    Status  New      OT LONG TERM GOAL #2   Title  Pt will improve bilateral functional reach and balance as shown by improving standing functional reach by at least 2" bilaterally.    Baseline  RUE 7.5, LUE 8.5    Time  8    Period  Weeks    Status  New   11 inches     OT LONG TERM GOAL #3   Title  Pt will be able to sustain upright posture for at least 8 min during seated functional task for incr ease using mouse and incr safety for driving.      OT LONG TERM GOAL #4   Title  Pt  will demo improved coordination/functional reaching for ADLs as shown by improving score on box and blocks test by at least 6 bilaterally.    Baseline  R-49, L-43 blocks    Time  8    Period  Weeks    Status  New   135 shoulder flexion, -5     OT LONG TERM GOAL #5   Title  ------            Plan - 11/11/18 1507    Clinical Impression Statement  Pt is progressing towards goals. He has acheived all short term goals and is progressing towards long term goals. Dateds of reporting period are 09/05/2018-11/11/2018. Pt can benefit from skilled occupational therapy to continue to address: coordination, timing, functional mobility, rigidity, decreased balance and abnormal posture in order to maximize pt's safty and indepdence with ADLs/IADLs.    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  PMH: PD diagnosed in 2008, s/p bilateral DBS, skin cancer, sleep apnea, BPH, TURP, orthostatic hypotension, syncopal episodes; arthritic changes in spine with hx of back pain    Occupational performance deficits (Please refer to evaluation for details):  ADL's;IADL's;Work;Leisure;Social Participation    Body Structure / Function / Physical Skills  ADL;Coordination;Endurance;UE functional use;GMC;Decreased knowledge of precautions;Balance;Decreased knowledge of use of DME;Flexibility;IADL;Pain;FMC;Dexterity;Mobility;Gait;ROM;Tone    Cognitive Skills  Attention;Safety Awareness;Memory    Rehab Potential  Good    OT Frequency  2x / week    OT Duration  8 weeks    OT Treatment/Interventions  Self-care/ADL training;Therapeutic exercise;Patient/family education;Moist Heat;Energy conservation;Therapist, nutritional;Therapeutic activities;Balance training;Cognitive remediation/compensation;Passive range of motion;Manual Therapy;DME and/or AE  instruction;Cryotherapy;Fluidtherapy;Aquatic Therapy;Neuromuscular education    Plan  Issue HEP chart,  work on posture and shoulder ROM, functional reach, ADL strategies,    Recommended Other Services  physical therapy due to  falls/changes in gait    Consulted and Agree with Plan of Care  Patient       Patient will benefit from skilled therapeutic intervention in order to improve the following deficits and impairments:   Body Structure / Function / Physical Skills: ADL, Coordination, Endurance, UE functional use, GMC, Decreased knowledge of precautions, Balance, Decreased knowledge of use of DME, Flexibility, IADL, Pain, FMC, Dexterity, Mobility, Gait, ROM, Tone Cognitive Skills: Attention, Safety Awareness, Memory     Visit Diagnosis: 1. Other symptoms and signs involving the nervous system   2. Abnormal posture   3. Other abnormalities of gait and mobility   4. Other lack of coordination   5. Other symptoms and signs involving the musculoskeletal system   6. Stiffness of right shoulder, not elsewhere classified   7. Attention and concentration deficit   8. Stiffness of left shoulder, not elsewhere classified   9. Muscle weakness (generalized)       Problem List Patient Active Problem List   Diagnosis Date Noted  . Parkinson disease ([place])     , 11/11/2018, 3:24 PM  [facility] [address] [place], [name], [address] Phone: [phone]   Fax:  [phone]  Name: Kolbi Altadonna MRN: [id number] Date of Birth: 1945/07/05

## 2018-11-11 NOTE — Patient Instructions (Signed)
Access Code: [email or web address]  URL: [email or web address]  Date: 11/11/2018  Prepared by: Mady Haagensen   Exercises  Sit to Stand with Hands on Knees - 10 reps - 1-2 sets - 2x daily - 7x weekly

## 2018-11-15 ENCOUNTER — Ambulatory Visit: Payer: Medicare Other | Admitting: Occupational Therapy

## 2018-11-15 ENCOUNTER — Other Ambulatory Visit: Payer: Self-pay

## 2018-11-15 DIAGNOSIS — R41840 Attention and concentration deficit: Secondary | ICD-10-CM

## 2018-11-15 DIAGNOSIS — R293 Abnormal posture: Secondary | ICD-10-CM | POA: Diagnosis not present

## 2018-11-15 DIAGNOSIS — M6281 Muscle weakness (generalized): Secondary | ICD-10-CM

## 2018-11-15 DIAGNOSIS — R29898 Other symptoms and signs involving the musculoskeletal system: Secondary | ICD-10-CM | POA: Diagnosis not present

## 2018-11-15 DIAGNOSIS — R278 Other lack of coordination: Secondary | ICD-10-CM

## 2018-11-15 DIAGNOSIS — R2689 Other abnormalities of gait and mobility: Secondary | ICD-10-CM

## 2018-11-15 DIAGNOSIS — R29818 Other symptoms and signs involving the nervous system: Secondary | ICD-10-CM | POA: Diagnosis not present

## 2018-11-15 DIAGNOSIS — M25611 Stiffness of right shoulder, not elsewhere classified: Secondary | ICD-10-CM

## 2018-11-15 DIAGNOSIS — R2681 Unsteadiness on feet: Secondary | ICD-10-CM

## 2018-11-15 DIAGNOSIS — M25612 Stiffness of left shoulder, not elsewhere classified: Secondary | ICD-10-CM

## 2018-11-15 NOTE — Patient Instructions (Addendum)
 (  Exercise) Monday Tuesday Wednesday Thursday Friday Saturday Sunday   PWR! Lying on back           PWR! On hands & knees           PWR! sitting           PWR! standing           PWR! On stomach           swimming           Walking working on [employer] Hands            Coordination exercises            Sit to Stand

## 2018-11-15 NOTE — Therapy (Signed)
[facility] [address] [place], [name], [address] Phone: [phone]   Fax:  [phone]  Occupational Therapy Treatment  Patient Details  Name: Ronald Noble MRN: [id number] Date of Birth: 1945-09-01 Referring Provider (OT): Dr. Star Age   Encounter Date: 11/15/2018  OT End of Session - 11/15/18 0726    Visit Number  11    Number of Visits  17    Date for OT Re-Evaluation  11/04/18    Authorization Type  Medicare & BCBS, covered 100%    Authorization Time Period  cert period 07/08/29-5/40/08    Authorization - Visit Number  11    Authorization - Number of Visits  20    OT Start Time  0723    OT Stop Time  0801    OT Time Calculation (min)  38 min    Activity Tolerance  Patient tolerated treatment well    Behavior During Therapy  [facility] for tasks assessed/performed       Past Medical History:  Diagnosis Date  . BPH (benign prostatic hyperplasia)   . Parkinson disease ([place])   . Skin cancer of face    arms and back  . Sleep apnea    mild  . Spider bite    brown recluse/ left ankle/08/13    Past Surgical History:  Procedure Laterality Date  . BURR HOLE W/ STEREOTACTIC INSERTION OF DBS LEADS / INTRAOP MICROELECTRODE RECORDING  04/2014  . COLONOSCOPY    . KNEE SURGERY     exploratory/left  . SKIN CANCER EXCISION  01/2014   face  . TRANSURETHRAL RESECTION OF PROSTATE     2008    There were no vitals filed for this visit.  Subjective Assessment - 11/15/18 0725    Subjective   Pt reports R hip pain at times, none currently    Pertinent History  PD diagnosed in 2008, bilateral DBS 2016    Limitations  fall risk, bilateral DBS    Patient Stated Goals  improve coordination    Currently in Pain?  No/denies    Pain Onset  More than a month ago         PWR! Moves (basic 4) in supine and sitting x 20 each with min cues For incr movement amplitude.  Sitting, functional reaching with  set-up/cueing for large amplitude movements, wt. Shift to the L for incr midline alignment and upright posture.  Then removing with BUEs alternating with min-mod cueing for midline alignment and posture.  Pt needed cueing to continue new directions x2 as he reverted to prior task.            OT Education - 11/15/18 0730    Education Details  PD Exercise Chart    Person(s) Educated  Patient    Methods  Explanation;Handout    Comprehension  Verbalized understanding       OT Short Term Goals - 11/11/18 0739      OT SHORT TERM GOAL #1   Title  Pt will be independent with updated PD specific HEP--check STGs 10/05/18    Time  4    Period  Weeks    Status  Achieved   needs reinforcement     OT SHORT TERM GOAL #2   Title  Pt will be able to sustain upright posture for at least 5 min during seated functional task for incr ease using mouse and incr safety for driving.    Baseline  leans to the R  and uses RUE support    Time  4    Period  Weeks    Status  On-going   not consistent     OT SHORT TERM GOAL #3   Title  Pt will demo improved coordination/functional reaching for ADLs as shown by improving score on box and blocks test by at least 3 bilaterally.    Baseline  R-49 blocks, L-43 blocks    Time  4    Period  Weeks    Status  Achieved   LUE 55, RUE 66     OT SHORT TERM GOAL #4   Title  Pt will be able to fasten/unfasten 3 buttons in 55sec or less for improved bilateral hand coordination/incr ease with dressing.    Baseline  64.34sec    Time  4    Period  Weeks    Status  Achieved   52 secs     OT SHORT TERM GOAL #5   Title  -----    Baseline  ---        OT Long Term Goals - 11/15/18 0745      OT LONG TERM GOAL #1   Title  Pt will verbalize understanding of adaptive strategies to incr safety/ease with ADL's/IADLS's--LTGs due 3/78/58    Time  8    Period  Weeks    Status  On-going      OT LONG TERM GOAL #2   Title  Pt will improve bilateral functional reach  and balance as shown by improving standing functional reach by at least 2" bilaterally.    Baseline  RUE 7.5, LUE 8.5    Time  8    Period  Weeks    Status  Achieved   11 inches     OT LONG TERM GOAL #3   Title  Pt will be able to sustain upright posture for at least 8 min during seated functional task for incr ease using mouse and incr safety for driving.      OT LONG TERM GOAL #4   Title  Pt will demo improved coordination/functional reaching for ADLs as shown by improving score on box and blocks test by at least 6 bilaterally.    Baseline  R-49, L-43 blocks    Time  8    Period  Weeks    Status  New   135 shoulder flexion, -5     OT LONG TERM GOAL #5   Title  ------            Plan - 11/15/18 8502    Clinical Impression Statement  Pt continues to progtess towards goals with continued reinforcement of ADL strategies and posture.  Maintaining upright posture/midline alignment during functional tasks remains difficult and was more difficult this morning (pt reports that he did not sleep well last night).    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  PMH: PD diagnosed in 2008, s/p bilateral DBS, skin cancer, sleep apnea, BPH, TURP, orthostatic hypotension, syncopal episodes; arthritic changes in spine with hx of back pain    Occupational performance deficits (Please refer to evaluation for details):  ADL's;IADL's;Work;Leisure;Social Participation    Body Structure / Function / Physical Skills  ADL;Coordination;Endurance;UE functional use;GMC;Decreased knowledge of precautions;Balance;Decreased knowledge of use of DME;Flexibility;IADL;Pain;FMC;Dexterity;Mobility;Gait;ROM;Tone    Cognitive Skills  Attention;Safety Awareness;Memory    Rehab Potential  Good    OT Frequency  2x / week    OT Duration  8 weeks    OT  Treatment/Interventions  Self-care/ADL training;Therapeutic exercise;Patient/family education;Moist Heat;Energy conservation;Therapist, nutritional;Therapeutic activities;Balance training;Cognitive remediation/compensation;Passive range of motion;Manual Therapy;DME and/or AE instruction;Cryotherapy;Fluidtherapy;Aquatic Therapy;Neuromuscular education    Plan  work on posture and shoulder ROM, functional reach, ADL strategies, anticipate d/c next week    Recommended Other Services  physical therapy due to falls/changes in gait    Consulted and Agree with Plan of Care  Patient       Patient will benefit from skilled therapeutic intervention in order to improve the following deficits and impairments:   Body Structure / Function / Physical Skills: ADL, Coordination, Endurance, UE functional use, GMC, Decreased knowledge of precautions, Balance, Decreased knowledge of use of DME, Flexibility, IADL, Pain, FMC, Dexterity, Mobility, Gait, ROM, Tone Cognitive Skills: Attention, Safety Awareness, Memory     Visit Diagnosis: 1. Abnormal posture   2. Other abnormalities of gait and mobility   3. Other lack of coordination   4. Other symptoms and signs involving the musculoskeletal system   5. Stiffness of right shoulder, not elsewhere classified   6. Attention and concentration deficit   7. Stiffness of left shoulder, not elsewhere classified   8. Muscle weakness (generalized)   9. Unsteadiness on feet       Problem List Patient Active Problem List   Diagnosis Date Noted  . Parkinson disease ([place])     [facility] 11/15/2018, 8:10 AM  [facility] [address] [place], [name], [address] Phone: [phone]   Fax:  [phone]  Name: Jodey Burbano MRN: [id number] Date of Birth: [birth year]   Vianne Bulls, OTR/L [facility] [address] [place], [place]  [address] [phone]  phone [phone] 11/15/18 8:10 AM

## 2018-11-16 ENCOUNTER — Ambulatory Visit: Payer: Medicare Other | Admitting: Occupational Therapy

## 2018-11-17 ENCOUNTER — Ambulatory Visit: Payer: Medicare Other | Admitting: Physical Therapy

## 2018-11-17 ENCOUNTER — Other Ambulatory Visit: Payer: Self-pay

## 2018-11-17 DIAGNOSIS — R2681 Unsteadiness on feet: Secondary | ICD-10-CM | POA: Diagnosis not present

## 2018-11-17 DIAGNOSIS — R29818 Other symptoms and signs involving the nervous system: Secondary | ICD-10-CM | POA: Diagnosis not present

## 2018-11-17 DIAGNOSIS — R293 Abnormal posture: Secondary | ICD-10-CM | POA: Diagnosis not present

## 2018-11-17 DIAGNOSIS — R278 Other lack of coordination: Secondary | ICD-10-CM

## 2018-11-17 DIAGNOSIS — R2689 Other abnormalities of gait and mobility: Secondary | ICD-10-CM | POA: Diagnosis not present

## 2018-11-17 DIAGNOSIS — R29898 Other symptoms and signs involving the musculoskeletal system: Secondary | ICD-10-CM | POA: Diagnosis not present

## 2018-11-17 NOTE — Therapy (Signed)
[facility] [address] [place], [name], [address] Phone: [phone]   Fax:  [phone]  Physical Therapy Treatment  Patient Details  Name: Ronald Noble MRN: [id number] Date of Birth: 1945/11/24 Referring Provider (PT): Dr. Star Age   Encounter Date: 11/17/2018  PT End of Session - 11/17/18 1131    Visit Number  4    Number of Visits  17    Date for PT Re-Evaluation  12/19/18    Authorization Type  Medicare & BCBS - 10th visit PN    PT Start Time  0850    PT Stop Time  0930    PT Time Calculation (min)  40 min    Activity Tolerance  Patient tolerated treatment well    Behavior During Therapy  [facility] for tasks assessed/performed       Past Medical History:  Diagnosis Date  . BPH (benign prostatic hyperplasia)   . Parkinson disease ([place])   . Skin cancer of face    arms and back  . Sleep apnea    mild  . Spider bite    brown recluse/ left ankle/08/13    Past Surgical History:  Procedure Laterality Date  . BURR HOLE W/ STEREOTACTIC INSERTION OF DBS LEADS / INTRAOP MICROELECTRODE RECORDING  04/2014  . COLONOSCOPY    . KNEE SURGERY     exploratory/left  . SKIN CANCER EXCISION  01/2014   face  . TRANSURETHRAL RESECTION OF PROSTATE     2008    There were no vitals filed for this visit.  Subjective Assessment - 11/17/18 0856    Subjective  Pt reports not sleeping well the past several nights.  Has been having pain from R hamstring tighness.  Is still swimming some at his sons pool.    Currently in Pain?  Yes    Pain Score  2     Pain Location  Knee    Pain Orientation  Right    Pain Descriptors / Indicators  Aching    Pain Type  Acute pain    Pain Onset  In the past 7 days    Pain Frequency  Intermittent           [facility] Adult PT Treatment/Exercise - 11/17/18 0001      Transfers   Transfers  Sit to Stand;Stand to Sit    Sit to Stand  5: Supervision;Without upper extremity  assist;From bed    Sit to Stand Details  Verbal cues for technique;Verbal cues for sequencing    Stand to Sit  5: Supervision    Stand to Sit Details (indicate cue type and reason)  Verbal cues for sequencing;Verbal cues for technique      Ambulation/Gait   Ambulation/Gait  Yes    Ambulation/Gait Assistance  4: Min guard;6: Modified independent (Device/Increase time)    Ambulation/Gait Assistance Details  cues for increased step length, increased BOS, heel strike and upright trunk    Ambulation Distance (Feet)  120 Feet   x 4   Assistive device  Straight cane;None    Gait Pattern  Decreased arm swing - right;Shuffle;Lateral trunk lean to right;Decreased trunk rotation;Trunk flexed;Narrow base of support;Decreased step length - left;Decreased step length - right    Ambulation Surface  Level;Indoor    Gait Comments  Pt's gait very unsteady upon arrival to clinic with significant shuffling, freezing, forward LOB and decreased BOS.  After stretching and exercises gait improved and able to ambulate without cane.  Pt  required min assist at times initially due to LOB forward.      Posture/Postural Control   Posture/Postural Control  Postural limitations    Postural Limitations  Rounded Shoulders;Weight shift right;Flexed trunk;Forward head      Exercises   Exercises  Knee/Hip      Knee/Hip Exercises: Stretches   Active Hamstring Stretch  Both;4 reps;30 seconds;Other (comment)   supine on mat and again standing at counter   Passive Hamstring Stretch  Both;1 rep;60 seconds;Other (comment)   supine on mat with PTA providing stretch & contract/relax   Other Knee/Hip Stretches  heel cord stretch bil off edge of step with UE support x 30 sec x 2 reps      Knee/Hip Exercises: Aerobic   Other Aerobic  Scifit level 2.0 all 4 extremities x 8 minutes with rpm>60 with bouts of increased rpm to 90.        [facility]) - 11/17/18 1139    PWR! exercises  Moves in standing    PWR! Up  Standing x 20     PWR! Rock  x 20    PWR! Twist  x20    PWR Step  x20    Comments  Used mirror for visual cues along with tactile cues from PTA.  Technique does improve with visual cues.  Discussed pt getting full length mirror to use at home (pt only has small mirror at home to use).            PT Short Term Goals - 10/20/18 1317      PT SHORT TERM GOAL #1   Title  Patient will be independent with initial HEP for balance and strengthening in order to build upon functional gains in therapy. ALL STG TARGET DATE 11/19/18    Time  4    Period  Weeks    Status  New    Target Date  11/19/18      PT SHORT TERM GOAL #2   Title  Patient will verbalize fall prevention strategies in the home in order to decrease risk of future falls.    Time  4    Period  Weeks    Status  New      PT SHORT TERM GOAL #3   Title  Patient will improve miniBEST score to at least a 22/28 in order to demonstrate decreased fall risk.    Baseline  20/28 on 10/20/18    Time  4    Period  Weeks    Status  New      PT SHORT TERM GOAL #4   Title  Patient will perform 5 x sit to stand from 18" chair with no UE support demonstrating no posterior LOB.    Baseline  episode of posterior LOB and decreased eccentric control when lowering    Time  4    Period  Weeks    Status  New        PT Long Term Goals - 10/20/18 1320      PT LONG TERM GOAL #1   Title  Patient will be independent with final HEP in order to build upon functional gains made in therapy. ALL TARGET LTG DATE 12/19/18    Time  8    Period  Weeks    Status  New    Target Date  12/19/18      PT LONG TERM GOAL #2   Title  Patient will improve miniBEST score to at least a 24/28 in  order to decrease risk of future falls.    Baseline  20/28 on 10/20/18    Time  8    Period  Weeks    Status  New      PT LONG TERM GOAL #3   Title  Patient will ambulate at least 23' with increased reciprocal arm swing and larger step lengths with no verbal cueing from therapist in  order to improve gait mechanics.    Time  8    Period  Weeks    Status  New      PT LONG TERM GOAL #4   Title  Patient will demonstrate fluid gait with walking sticks mod I with no verbal cueing in order to increase safety while ambulating in his neighborhood.    Time  8    Period  Weeks    Status  New      PT LONG TERM GOAL #5   Title  Patient will perform sit to stand from <18" chair with no UE support and no posterior LOB upon standing in order to increase safety.    Time  8    Period  Weeks    Status  New      Additional Long Term Goals   Additional Long Term Goals  Yes      PT LONG TERM GOAL #6   Title  Patient will ambulate 1,000' outside over uneven surfaces/inclines with no AD/LRAD and mod I in order to return to prior level of fitness in the community/neighborhood.    Time  8    Period  Weeks    Status  New            Plan - 11/17/18 1131    Clinical Impression Statement  Skilled session focused on stretching, endurance and mobility.  Pt with improvement in gait after stretching and therapy session.  Continues to report LOB forward with gait and also observed during session today.  Pt continues to have shuffled gait and freezing at times. Continue PT per POC.    Personal Factors and Comorbidities  Comorbidity 1;Past/Current Experience    Comorbidities  PD    Examination-Activity Limitations  Locomotion Level    Examination-Participation Restrictions  Other   Exercise (previously liked to jog)   Stability/Clinical Decision Making  Evolving/Moderate complexity    Rehab Potential  Good    PT Frequency  2x / week    PT Duration  8 weeks    PT Treatment/Interventions  ADLs/Self Care Home Management;Therapeutic exercise;Therapeutic activities;Functional mobility training;Stair training;Gait training;DME Instruction;Balance training;Neuromuscular re-education;Patient/family education    PT Next Visit Plan  Review sit<>stand technique; gait training with slowed pace, use  of mirror for visual feedback and timing; may try metronome for timing of gait; standing exercises to increase step length; fall prevention in home envrnionment.    Consulted and Agree with Plan of Care  Patient       Patient will benefit from skilled therapeutic intervention in order to improve the following deficits and impairments:  Abnormal gait, Decreased balance, Decreased coordination, Decreased range of motion, Decreased mobility, Decreased strength, Difficulty walking, Postural dysfunction, Decreased activity tolerance  Visit Diagnosis: 1. Other abnormalities of gait and mobility   2. Abnormal posture   3. Other lack of coordination        Problem List Patient Active Problem List   Diagnosis Date Noted  . Parkinson disease ([place])     Narda Bonds, [place] 11/17/18 11:43 AM  Phone: [phone] Fax: [facility] [address] [place], [name], [address] Phone: [phone]   Fax:  [phone]  Name: Ronald Noble MRN: [id number] Date of Birth: March 25, 1946

## 2018-11-22 ENCOUNTER — Ambulatory Visit: Payer: Medicare Other | Admitting: Occupational Therapy

## 2018-11-22 ENCOUNTER — Other Ambulatory Visit: Payer: Self-pay

## 2018-11-22 DIAGNOSIS — R278 Other lack of coordination: Secondary | ICD-10-CM

## 2018-11-22 DIAGNOSIS — R2681 Unsteadiness on feet: Secondary | ICD-10-CM | POA: Diagnosis not present

## 2018-11-22 DIAGNOSIS — R29898 Other symptoms and signs involving the musculoskeletal system: Secondary | ICD-10-CM

## 2018-11-22 DIAGNOSIS — M25612 Stiffness of left shoulder, not elsewhere classified: Secondary | ICD-10-CM

## 2018-11-22 DIAGNOSIS — R2689 Other abnormalities of gait and mobility: Secondary | ICD-10-CM | POA: Diagnosis not present

## 2018-11-22 DIAGNOSIS — R29818 Other symptoms and signs involving the nervous system: Secondary | ICD-10-CM | POA: Diagnosis not present

## 2018-11-22 DIAGNOSIS — R41840 Attention and concentration deficit: Secondary | ICD-10-CM

## 2018-11-22 DIAGNOSIS — R293 Abnormal posture: Secondary | ICD-10-CM | POA: Diagnosis not present

## 2018-11-22 DIAGNOSIS — M6281 Muscle weakness (generalized): Secondary | ICD-10-CM

## 2018-11-22 DIAGNOSIS — M25611 Stiffness of right shoulder, not elsewhere classified: Secondary | ICD-10-CM

## 2018-11-22 NOTE — Patient Instructions (Signed)

## 2018-11-22 NOTE — Therapy (Signed)
[facility] [address] [place], [name], [address] Phone: [phone]   Fax:  [phone]  Occupational Therapy Treatment  Patient Details  Name: Ronald Noble MRN: [id number] Date of Birth: 12-Oct-1945 Referring Provider (OT): Dr. Star Age   Encounter Date: 11/22/2018  OT End of Session - 11/22/18 0754    Visit Number  12    Number of Visits  17    Date for OT Re-Evaluation  11/04/18    Authorization Type  Medicare & BCBS, covered 100%    Authorization Time Period  cert period 08/08/54-2/56/38    Authorization - Visit Number  12    Authorization - Number of Visits  20    OT Start Time  0721    OT Stop Time  0800    OT Time Calculation (min)  39 min    Activity Tolerance  Patient tolerated treatment well    Behavior During Therapy  [facility] for tasks assessed/performed       Past Medical History:  Diagnosis Date  . BPH (benign prostatic hyperplasia)   . Parkinson disease ([place])   . Skin cancer of face    arms and back  . Sleep apnea    mild  . Spider bite    brown recluse/ left ankle/08/13    Past Surgical History:  Procedure Laterality Date  . BURR HOLE W/ STEREOTACTIC INSERTION OF DBS LEADS / INTRAOP MICROELECTRODE RECORDING  04/2014  . COLONOSCOPY    . KNEE SURGERY     exploratory/left  . SKIN CANCER EXCISION  01/2014   face  . TRANSURETHRAL RESECTION OF PROSTATE     2008    There were no vitals filed for this visit.  Subjective Assessment - 11/22/18 0929    Subjective   Pt reports R hip pain at times, none currently    Pertinent History  PD diagnosed in 2008, bilateral DBS 2016    Limitations  fall risk, bilateral DBS    Patient Stated Goals  improve coordination    Currently in Pain?  Yes    Pain Score  3     Pain Location  Hip    Pain Orientation  Right    Pain Descriptors / Indicators  Aching    Pain Type  Chronic pain    Pain Onset  More than a month ago    Pain Frequency   Intermittent    Aggravating Factors   swimming    Pain Relieving Factors  rest          Treatment: Reviewed PWR! Moves quadraped 10-20 reps each, min v.c Arm bike x5 mins level 1 pt maintained 40 rpm, minv.c Education regarding big movements with ADLs, handout issued, pt verbalizes understanding. Therapist started checking progress towards goals. See goals for progress.                   OT Short Term Goals - 11/11/18 0739      OT SHORT TERM GOAL #1   Title  Pt will be independent with updated PD specific HEP--check STGs 10/05/18    Time  4    Period  Weeks    Status  Achieved   needs reinforcement     OT SHORT TERM GOAL #2   Title  Pt will be able to sustain upright posture for at least 5 min during seated functional task for incr ease using mouse and incr safety for driving.    Baseline  leans  to the R and uses RUE support    Time  4    Period  Weeks    Status  On-going   not consistent     OT SHORT TERM GOAL #3   Title  Pt will demo improved coordination/functional reaching for ADLs as shown by improving score on box and blocks test by at least 3 bilaterally.    Baseline  R-49 blocks, L-43 blocks    Time  4    Period  Weeks    Status  Achieved   LUE 55, RUE 66     OT SHORT TERM GOAL #4   Title  Pt will be able to fasten/unfasten 3 buttons in 55sec or less for improved bilateral hand coordination/incr ease with dressing.    Baseline  64.34sec    Time  4    Period  Weeks    Status  Achieved   52 secs     OT SHORT TERM GOAL #5   Title  -----    Baseline  ---        OT Long Term Goals - 11/22/18 0751      OT LONG TERM GOAL #1   Title  Pt will verbalize understanding of adaptive strategies to incr safety/ease with ADL's/IADLS's--LTGs due 04/25/39    Time  8    Period  Weeks    Status  Achieved   Pt has been provided with information regarding big movements with ADLs.     OT LONG TERM GOAL #2   Title  Pt will improve bilateral functional  reach and balance as shown by improving standing functional reach by at least 2" bilaterally.    Baseline  RUE 7.5, LUE 8.5    Time  8    Period  Weeks    Status  Achieved   11 inches     OT LONG TERM GOAL #3   Title  Pt will be able to sustain upright posture for at least 8 min during seated functional task for incr ease using mouse and incr safety for driving.    Status  On-going      OT LONG TERM GOAL #4   Title  Pt will demo improved coordination/functional reaching for ADLs as shown by improving score on box and blocks test by at least 6 bilaterally.    Baseline  R-49, L-43 blocks    Time  8    Period  Weeks    Status  Achieved   achieved RUE-64, LUE 54     OT LONG TERM GOAL #5   Title  ------            Plan - 11/22/18 7408    Clinical Impression Statement  Pt demonstrates good overall progress. He agrees with plans for d/c tomorrow.    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  PMH: PD diagnosed in 2008, s/p bilateral DBS, skin cancer, sleep apnea, BPH, TURP, orthostatic hypotension, syncopal episodes; arthritic changes in spine with hx of back pain    Occupational performance deficits (Please refer to evaluation for details):  ADL's;IADL's;Work;Leisure;Social Participation    Body Structure / Function / Physical Skills  ADL;Coordination;Endurance;UE functional use;GMC;Decreased knowledge of precautions;Balance;Decreased knowledge of use of DME;Flexibility;IADL;Pain;FMC;Dexterity;Mobility;Gait;ROM;Tone    Cognitive Skills  Attention;Safety Awareness;Memory    Rehab Potential  Good    OT Frequency  2x / week    OT Duration  8 weeks    OT Treatment/Interventions  Self-care/ADL training;Therapeutic exercise;Patient/family education;Moist Heat;Energy conservation;Therapist, nutritional;Therapeutic  activities;Balance training;Cognitive remediation/compensation;Passive range of motion;Manual Therapy;DME and/or AE instruction;Cryotherapy;Fluidtherapy;Aquatic Therapy;Neuromuscular education    Plan  work on posture and shoulder ROM, functional reach, ADL strategies, anticipate d/c tomorrow.    Recommended Other Services  physical therapy due to falls/changes in gait    Consulted and Agree with Plan of Care  Patient       Patient will benefit from skilled therapeutic intervention in order to improve the following deficits and impairments:   Body Structure / Function / Physical Skills: ADL, Coordination, Endurance, UE functional use, GMC, Decreased knowledge of precautions, Balance, Decreased knowledge of use of DME, Flexibility, IADL, Pain, FMC, Dexterity, Mobility, Gait, ROM, Tone Cognitive Skills: Attention, Safety Awareness, Memory     Visit Diagnosis: 1. Abnormal posture   2. Other lack of coordination   3. Other symptoms and signs involving the musculoskeletal system   4. Stiffness of right shoulder, not elsewhere classified   5. Attention and concentration deficit   6. Stiffness of left shoulder, not elsewhere classified   7. Muscle weakness (generalized)       Problem List Patient Active Problem List   Diagnosis Date Noted  . Parkinson disease ([place])     RINE,KATHRYN 11/22/2018, 9:29 AM  [facility] [address] [place], [name], [address] Phone: [phone]   Fax:  [phone]  Name: Krithik Mapel MRN: [id number] Date of Birth: 10-02-1945

## 2018-11-23 ENCOUNTER — Ambulatory Visit: Payer: Medicare Other | Admitting: Occupational Therapy

## 2018-11-23 ENCOUNTER — Encounter: Payer: Self-pay | Admitting: Occupational Therapy

## 2018-11-23 ENCOUNTER — Encounter: Payer: Self-pay | Admitting: Physical Therapy

## 2018-11-23 ENCOUNTER — Ambulatory Visit: Payer: Medicare Other | Admitting: Physical Therapy

## 2018-11-23 DIAGNOSIS — R29898 Other symptoms and signs involving the musculoskeletal system: Secondary | ICD-10-CM

## 2018-11-23 DIAGNOSIS — R29818 Other symptoms and signs involving the nervous system: Secondary | ICD-10-CM

## 2018-11-23 DIAGNOSIS — R2681 Unsteadiness on feet: Secondary | ICD-10-CM

## 2018-11-23 DIAGNOSIS — M25612 Stiffness of left shoulder, not elsewhere classified: Secondary | ICD-10-CM

## 2018-11-23 DIAGNOSIS — M6281 Muscle weakness (generalized): Secondary | ICD-10-CM

## 2018-11-23 DIAGNOSIS — M25611 Stiffness of right shoulder, not elsewhere classified: Secondary | ICD-10-CM

## 2018-11-23 DIAGNOSIS — R2689 Other abnormalities of gait and mobility: Secondary | ICD-10-CM

## 2018-11-23 DIAGNOSIS — R41840 Attention and concentration deficit: Secondary | ICD-10-CM

## 2018-11-23 DIAGNOSIS — R293 Abnormal posture: Secondary | ICD-10-CM | POA: Diagnosis not present

## 2018-11-23 DIAGNOSIS — R278 Other lack of coordination: Secondary | ICD-10-CM | POA: Diagnosis not present

## 2018-11-23 NOTE — Therapy (Signed)
[facility] [address] [place], [name], [address] Phone: [phone]   Fax:  [phone]  Occupational Therapy Treatment  Patient Details  Name: Ronald Noble MRN: [id number] Date of Birth: Mar 20, 1946 Referring Provider (OT): Dr. Star Age   Encounter Date: 11/23/2018  OT End of Session - 11/23/18 0730    Visit Number  13    Number of Visits  17    Date for OT Re-Evaluation  11/04/18    Authorization Type  Medicare & BCBS, covered 100%    Authorization Time Period  cert period 04/09/46-1/85/63    Authorization - Visit Number  30    Authorization - Number of Visits  20    OT Start Time  0720    OT Stop Time  0759    OT Time Calculation (min)  39 min    Activity Tolerance  Patient tolerated treatment well    Behavior During Therapy  [facility] for tasks assessed/performed       Past Medical History:  Diagnosis Date  . BPH (benign prostatic hyperplasia)   . Parkinson disease ([place])   . Skin cancer of face    arms and back  . Sleep apnea    mild  . Spider bite    brown recluse/ left ankle/08/13    Past Surgical History:  Procedure Laterality Date  . BURR HOLE W/ STEREOTACTIC INSERTION OF DBS LEADS / INTRAOP MICROELECTRODE RECORDING  04/2014  . COLONOSCOPY    . KNEE SURGERY     exploratory/left  . SKIN CANCER EXCISION  01/2014   face  . TRANSURETHRAL RESECTION OF PROSTATE     2008    There were no vitals filed for this visit.  Subjective Assessment - 11/23/18 0729    Subjective   Pt reports that he is not on R side as much--improved.    Pertinent History  PD diagnosed in 2008, bilateral DBS 2016    Limitations  fall risk, bilateral DBS    Patient Stated Goals  improve coordination    Currently in Pain?  No/denies    Pain Score  3     Pain Location  Hip    Pain Orientation  Right;Left    Pain Descriptors / Indicators  Aching    Pain Type  Chronic pain    Pain Onset  More than a month ago    Pain Frequency  Intermittent    Aggravating Factors   movement, walking    Pain Relieving Factors  rest         PWR! Moves (basic 4) in supine, prone, quadruped (with chair for support for PWR! Step) x 20 each with min cues For incr movement amplitude.  In sitting, functional reaching with each UE incorporating wt. Shift and trunk rotation.  Pt without cueing for posture x37mn today.    Discussed progress and follow-up and continued exercise and work on posture (including stopping frequently to reset posture).  Pt verbalized understanding.          OT Short Term Goals - 11/23/18 0800      OT SHORT TERM GOAL #1   Title  Pt will be independent with updated PD specific HEP--check STGs 10/05/18    Time  4    Period  Weeks    Status  Achieved   needs reinforcement     OT SHORT TERM GOAL #2   Title  Pt will be able to sustain upright posture for at least 5  min during seated functional task for incr ease using mouse and incr safety for driving.    Baseline  leans to the R and uses RUE support    Time  4    Period  Weeks    Status  Achieved   not consistent.  11/23/18 met at approx this level     OT SHORT TERM GOAL #3   Title  Pt will demo improved coordination/functional reaching for ADLs as shown by improving score on box and blocks test by at least 3 bilaterally.    Baseline  R-49 blocks, L-43 blocks    Time  4    Period  Weeks    Status  Achieved   LUE 55, RUE 66     OT SHORT TERM GOAL #4   Title  Pt will be able to fasten/unfasten 3 buttons in 55sec or less for improved bilateral hand coordination/incr ease with dressing.    Baseline  64.34sec    Time  4    Period  Weeks    Status  Achieved   52 secs     OT SHORT TERM GOAL #5   Title  -----    Baseline  ---        OT Long Term Goals - 11/23/18 0930      OT LONG TERM GOAL #1   Title  Pt will verbalize understanding of adaptive strategies to incr safety/ease with ADL's/IADLS's--LTGs due 07/29/93    Time  8     Period  Weeks    Status  Achieved   Pt has been provided with information regarding big movements with ADLs.     OT LONG TERM GOAL #2   Title  Pt will improve bilateral functional reach and balance as shown by improving standing functional reach by at least 2" bilaterally.    Baseline  RUE 7.5, LUE 8.5    Time  8    Period  Weeks    Status  Achieved   11 inches     OT LONG TERM GOAL #3   Title  Pt will be able to sustain upright posture for at least 8 min during seated functional task for incr ease using mouse and incr safety for driving.    Status  Not Met   11/23/18   not consistent     OT LONG TERM GOAL #4   Title  Pt will demo improved coordination/functional reaching for ADLs as shown by improving score on box and blocks test by at least 6 bilaterally.    Baseline  R-49, L-43 blocks    Time  8    Period  Weeks    Status  Achieved   achieved RUE-64, LUE 54     OT LONG TERM GOAL #5   Title  ------            Plan - 11/23/18 6387    Clinical Impression Statement  Pt demonstrates good overall progress and agrees with d/c.  Posture continues to remain difficult to maintain during functional tasks.    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  PMH: PD diagnosed in 2008, s/p bilateral DBS, skin cancer, sleep apnea, BPH, TURP, orthostatic hypotension, syncopal episodes; arthritic changes in spine with hx of back pain    Occupational performance deficits (Please refer to evaluation for details):  ADL's;IADL's;Work;Leisure;Social Participation  Body Structure / Function / Physical Skills  ADL;Coordination;Endurance;UE functional use;GMC;Decreased knowledge of precautions;Balance;Decreased knowledge of use of DME;Flexibility;IADL;Pain;FMC;Dexterity;Mobility;Gait;ROM;Tone    Cognitive Skills  Attention;Safety  Awareness;Memory    Rehab Potential  Good    OT Frequency  2x / week    OT Duration  8 weeks    OT Treatment/Interventions  Self-care/ADL training;Therapeutic exercise;Patient/family education;Moist Heat;Energy conservation;Therapist, nutritional;Therapeutic activities;Balance training;Cognitive remediation/compensation;Passive range of motion;Manual Therapy;DME and/or AE instruction;Cryotherapy;Fluidtherapy;Aquatic Therapy;Neuromuscular education    Plan  d/c OT, follow up in approx 6-8 months for reassessment/screen    Recommended Other Services  physical therapy due to falls/changes in gait    Consulted and Agree with Plan of Care  Patient       Patient will benefit from skilled therapeutic intervention in order to improve the following deficits and impairments:   Body Structure / Function / Physical Skills: ADL, Coordination, Endurance, UE functional use, GMC, Decreased knowledge of precautions, Balance, Decreased knowledge of use of DME, Flexibility, IADL, Pain, FMC, Dexterity, Mobility, Gait, ROM, Tone Cognitive Skills: Attention, Safety Awareness, Memory     Visit Diagnosis: 1. Other symptoms and signs involving the musculoskeletal system   2. Stiffness of right shoulder, not elsewhere classified   3. Attention and concentration deficit   4. Stiffness of left shoulder, not elsewhere classified   5. Muscle weakness (generalized)   6. Other lack of coordination   7. Abnormal posture   8. Other abnormalities of gait and mobility   9. Unsteadiness on feet   10. Other symptoms and signs involving the nervous system       Problem List Patient Active Problem List   Diagnosis Date Noted  . Parkinson disease ([place])      OCCUPATIONAL THERAPY DISCHARGE SUMMARY  Visits from Start of Care: 13  Current functional level related to goals / functional outcomes: See above   Remaining deficits: Bradykinesia, rigidity, decr coordination, abnormal posture, decr  balance/functional mobility, cognitive deficits, decr ROM   Education / Equipment: Pt was instructed in the following:  PD-specific HEP, adaptive strategies for ADLs/IADLs, ways to prevent future complications.  Pt verbalized understanding of all education provided.    Plan: Patient agrees to discharge.  Patient goals were not met. Patient is being discharged due to being pleased with the current functional level.  Pt would benefit from re-evaluation/occupational therapy screen in approx 6 months to assess for need for further therapy/functional changes due to progressive nature of diagnosis.         [facility] 11/23/2018, 9:31 AM  [facility] [address] [place], [name], [address] Phone: [phone]   Fax:  [phone]  Name: Blanton Kardell MRN: [id number] Date of Birth: [birth year]   Vianne Bulls, OTR/L [facility] [address] [place], [place]  [address] [phone] phone [phone] 11/23/18 9:31 AM

## 2018-11-23 NOTE — Therapy (Signed)
[facility] [address] [place], [name], [address] Phone: [phone]   Fax:  [phone]  Physical Therapy Treatment  Patient Details  Name: Ronald Noble MRN: [id number] Date of Birth: 03-Aug-1945 Referring Provider (PT): Dr. Star Age   Encounter Date: 11/23/2018  PT End of Session - 11/23/18 0916    Visit Number  5    Number of Visits  17    Date for PT Re-Evaluation  12/19/18    Authorization Type  Medicare & BCBS - 10th visit PN    PT Start Time  0802    PT Stop Time  0845    PT Time Calculation (min)  43 min    Activity Tolerance  Patient tolerated treatment well    Behavior During Therapy  [facility] for tasks assessed/performed       Past Medical History:  Diagnosis Date  . BPH (benign prostatic hyperplasia)   . Parkinson disease ([place])   . Skin cancer of face    arms and back  . Sleep apnea    mild  . Spider bite    brown recluse/ left ankle/08/13    Past Surgical History:  Procedure Laterality Date  . BURR HOLE W/ STEREOTACTIC INSERTION OF DBS LEADS / INTRAOP MICROELECTRODE RECORDING  04/2014  . COLONOSCOPY    . KNEE SURGERY     exploratory/left  . SKIN CANCER EXCISION  01/2014   face  . TRANSURETHRAL RESECTION OF PROSTATE     2008    There were no vitals filed for this visit.  Subjective Assessment - 11/23/18 0805    Subjective  Runs into doorways and walls when he increases his gait speed. No falls. Hamstring is still feeling a little tight.    Pain Onset  In the past 7 days                       [facility] Adult PT Treatment/Exercise - 11/23/18 0907      Transfers   Transfers  Sit to Stand;Stand to Sit    Sit to Stand  5: Supervision;Without upper extremity assist;From bed    Sit to Stand Details  Verbal cues for technique;Verbal cues for sequencing    Sit to Stand Details (indicate cue type and reason)  Cues for slowed pace and anterior weight shift before coming  to stand.     Stand to Sit  5: Supervision    Stand to Sit Details (indicate cue type and reason)  Verbal cues for sequencing;Verbal cues for technique    Stand to Sit Details  Cues for slowed pace       Ambulation/Gait   Ambulation/Gait  Yes    Ambulation/Gait Assistance  5: Supervision    Ambulation/Gait Assistance Details  cues for increased step length, heel > toe pattern, upright posture, and reciprocal armswing. Metronome used to facilitate heel > toe pattern and gait speed and then stopped to see if pt was able to carryover. Fair carryover with step length, however pt did revert at times to forward flexed posture with PT telling pt to stop and "reset" with upright posture and slower speed before cotninuing to walk again. At end of session when pt states that his LLE was more fatigued, he demonstrated increased gait speed, shuffling, and forward flexed posture; cues needed to correct.    Ambulation Distance (Feet)  500 Feet   2 x 250 at beginning of session, 2 x 250 at end  Assistive device  None    Gait Pattern  Decreased arm swing - right;Shuffle;Lateral trunk lean to right;Decreased trunk rotation;Trunk flexed;Narrow base of support;Decreased step length - left;Decreased step length - right    Ambulation Surface  Level;Indoor          Pt performs PWR! Moves in standing position 2 x 10 reps. Mirror in front for visual cues for upright posture and technique, chair in front of pt for safety.    PWR! Up for improved posture  PWR! Rock for improved weighshifting  PWR! Twist for improved trunk rotation   PWR! Step for improved step initiation   Verbal and demonstrative cues provided for upright posture and technique.        PT Education - 11/23/18 0916    Education Details  technique for sit <> stand t/fs, gait training with metronome    Person(s) Educated  Patient    Methods  Explanation    Comprehension  Verbalized understanding;Returned demonstration;Need further  instruction       PT Short Term Goals - 10/20/18 1317      PT SHORT TERM GOAL #1   Title  Patient will be independent with initial HEP for balance and strengthening in order to build upon functional gains in therapy. ALL STG TARGET DATE 11/19/18    Time  4    Period  Weeks    Status  New    Target Date  11/19/18      PT SHORT TERM GOAL #2   Title  Patient will verbalize fall prevention strategies in the home in order to decrease risk of future falls.    Time  4    Period  Weeks    Status  New      PT SHORT TERM GOAL #3   Title  Patient will improve miniBEST score to at least a 22/28 in order to demonstrate decreased fall risk.    Baseline  20/28 on 10/20/18    Time  4    Period  Weeks    Status  New      PT SHORT TERM GOAL #4   Title  Patient will perform 5 x sit to stand from 18" chair with no UE support demonstrating no posterior LOB.    Baseline  episode of posterior LOB and decreased eccentric control when lowering    Time  4    Period  Weeks    Status  New        PT Long Term Goals - 10/20/18 1320      PT LONG TERM GOAL #1   Title  Patient will be independent with final HEP in order to build upon functional gains made in therapy. ALL TARGET LTG DATE 12/19/18    Time  8    Period  Weeks    Status  New    Target Date  12/19/18      PT LONG TERM GOAL #2   Title  Patient will improve miniBEST score to at least a 24/28 in order to decrease risk of future falls.    Baseline  20/28 on 10/20/18    Time  8    Period  Weeks    Status  New      PT LONG TERM GOAL #3   Title  Patient will ambulate at least 29' with increased reciprocal arm swing and larger step lengths with no verbal cueing from therapist in order to improve gait mechanics.    Time  8  Period  Weeks    Status  New      PT LONG TERM GOAL #4   Title  Patient will demonstrate fluid gait with walking sticks mod I with no verbal cueing in order to increase safety while ambulating in his neighborhood.     Time  8    Period  Weeks    Status  New      PT LONG TERM GOAL #5   Title  Patient will perform sit to stand from <18" chair with no UE support and no posterior LOB upon standing in order to increase safety.    Time  8    Period  Weeks    Status  New      Additional Long Term Goals   Additional Long Term Goals  Yes      PT LONG TERM GOAL #6   Title  Patient will ambulate 1,000' outside over uneven surfaces/inclines with no AD/LRAD and mod I in order to return to prior level of fitness in the community/neighborhood.    Time  8    Period  Weeks    Status  New            Plan - 11/23/18 0917    Clinical Impression Statement  Today's skilled session focused on gait training to address gait speed and sequencing and standing PWR! moves to focus on posture, weightshifting, trunk flexibility and initial stepping. Utilized a metronome today during gait training for auditory cueing for gait speed and heel strike; pt able to demonstrate but still needing cues for reciprocal armswing - with fair  carryover after metronome is turned off. At end of session after PWR! standing moves pt demonstrated increased B step length, but had episodes of increasing gait speed and shuffling d/t pt reporting LLE fatigue. Will continue to progress towards LTGs.    Personal Factors and Comorbidities  Comorbidity 1;Past/Current Experience    Comorbidities  PD    Examination-Activity Limitations  Locomotion Level    Examination-Participation Restrictions  Other   Exercise (previously liked to jog)   Stability/Clinical Decision Making  Evolving/Moderate complexity    Rehab Potential  Good    PT Frequency  2x / week    PT Duration  8 weeks    PT Treatment/Interventions  ADLs/Self Care Home Management;Therapeutic exercise;Therapeutic activities;Functional mobility training;Stair training;Gait training;DME Instruction;Balance training;Neuromuscular re-education;Patient/family education    PT Next Visit Plan  *check  STGs* At end of session, pt was asking about safety while carrying in briefcase from car - practice ambulation with patient carrying an object like his suitcase. Review sit<>stand technique (from any kind of rolly chair to mimic office setting at home); gait training with slowed pace and continue with metronome, use of mirror for visual feedback and timing;  standing exercises to increase step length; fall prevention in home envrnionment.    Consulted and Agree with Plan of Care  Patient       Patient will benefit from skilled therapeutic intervention in order to improve the following deficits and impairments:  Abnormal gait, Decreased balance, Decreased coordination, Decreased range of motion, Decreased mobility, Decreased strength, Difficulty walking, Postural dysfunction, Decreased activity tolerance  Visit Diagnosis: 1. Abnormal posture   2. Other lack of coordination   3. Other symptoms and signs involving the musculoskeletal system   4. Muscle weakness (generalized)   5. Other abnormalities of gait and mobility   6. Unsteadiness on feet   7. Other symptoms and signs involving the nervous  system        Problem List Patient Active Problem List   Diagnosis Date Noted  . Parkinson disease ([place])     Arliss Journey, PT, DPT 11/23/2018, 9:25 AM  [facility] [address] [place], [name], [address] Phone: [phone]   Fax:  [phone]  Name: Ronald Noble MRN: [id number] Date of Birth: [birth year]

## 2018-11-24 DIAGNOSIS — Z23 Encounter for immunization: Secondary | ICD-10-CM | POA: Diagnosis not present

## 2018-11-25 ENCOUNTER — Other Ambulatory Visit: Payer: Self-pay

## 2018-11-25 ENCOUNTER — Ambulatory Visit: Payer: Medicare Other | Admitting: Physical Therapy

## 2018-11-25 ENCOUNTER — Encounter: Payer: Self-pay | Admitting: Physical Therapy

## 2018-11-25 DIAGNOSIS — R278 Other lack of coordination: Secondary | ICD-10-CM | POA: Diagnosis not present

## 2018-11-25 DIAGNOSIS — R2689 Other abnormalities of gait and mobility: Secondary | ICD-10-CM

## 2018-11-25 DIAGNOSIS — R293 Abnormal posture: Secondary | ICD-10-CM | POA: Diagnosis not present

## 2018-11-25 DIAGNOSIS — R2681 Unsteadiness on feet: Secondary | ICD-10-CM | POA: Diagnosis not present

## 2018-11-25 DIAGNOSIS — R29898 Other symptoms and signs involving the musculoskeletal system: Secondary | ICD-10-CM | POA: Diagnosis not present

## 2018-11-25 DIAGNOSIS — R29818 Other symptoms and signs involving the nervous system: Secondary | ICD-10-CM | POA: Diagnosis not present

## 2018-11-25 NOTE — Therapy (Signed)
[facility] [address] [place], [name], [address] Phone: [phone]   Fax:  [phone]  Physical Therapy Treatment  Patient Details  Name: Ronald Noble MRN: [id number] Date of Birth: February 16, 1946 Referring Provider (PT): Dr. Star Age   Encounter Date: 11/25/2018  CLINIC OPERATION CHANGES: [facility] is open at lower capacity following universal masking, social distancing, and patient screening.  The patient's COVID risk of complications score is 3.   PT End of Session - 11/25/18 0858    Visit Number  6    Number of Visits  17    Date for PT Re-Evaluation  12/19/18    Authorization Type  Medicare & BCBS - 10th visit PN    PT Start Time  0803    PT Stop Time  0842    PT Time Calculation (min)  39 min    Activity Tolerance  Patient tolerated treatment well    Behavior During Therapy  [facility] for tasks assessed/performed       Past Medical History:  Diagnosis Date  . BPH (benign prostatic hyperplasia)   . Parkinson disease ([place])   . Skin cancer of face    arms and back  . Sleep apnea    mild  . Spider bite    brown recluse/ left ankle/08/13    Past Surgical History:  Procedure Laterality Date  . BURR HOLE W/ STEREOTACTIC INSERTION OF DBS LEADS / INTRAOP MICROELECTRODE RECORDING  04/2014  . COLONOSCOPY    . KNEE SURGERY     exploratory/left  . SKIN CANCER EXCISION  01/2014   face  . TRANSURETHRAL RESECTION OF PROSTATE     2008    There were no vitals filed for this visit.  Subjective Assessment - 11/25/18 0804    Subjective  Feel a little stiffness this morning; mornings better to lift legs; I tends to speed up and shuffle steps more in the afternoon, on the carpet.    Patient Stated Goals  wants to improve his flexibility and posture, strengthen legs, walking    Currently in Pain?  Yes    Pain Score  2     Pain Location  Hip    Pain Orientation  Left    Pain Descriptors /  Indicators  Aching    Pain Onset  In the past 7 days    Pain Frequency  Intermittent    Aggravating Factors   movement, walking    Pain Relieving Factors  rest                       [facility] Adult PT Treatment/Exercise - 11/25/18 0001      Transfers   Transfers  Sit to Stand;Stand to Sit    Sit to Stand  6: Modified independent (Device/Increase time);Without upper extremity assist;From chair/3-in-1    Five time sit to stand comments   13.23   good technique   Stand to Sit  6: Modified independent (Device/Increase time);Without upper extremity assist;To chair/3-in-1    Comments  Initial cues for technqiue for sit<>stand, but overall, pt demo improved technique, improved pacing with sit to stand transfers this session.  Reviewed HEP which consists of sit<>stand and educated pt on importance of consistently performing sit<>stand as an exercise       Ambulation/Gait   Ambulation/Gait  Yes    Ambulation/Gait Assistance  5: Supervision    Ambulation/Gait Assistance Details  Cues for deliberate motion, increased foot clearance,  heelstrike and arm swing.  Practiced on compliant red and blue mat surfaces to simulate indoor/carpeted surfaces.  Also used the compliant surfaces as a means to increase intensity of lower extremity motion for improved foot clearance.  Cues to "not hear feet scuffing on mats" for improved clearance.    Ambulation Distance (Feet)  230 Feet   carrying 6#weight simulating briefcase; 20 ft x 8 on mats   Assistive device  None    Gait Pattern  Decreased arm swing - right;Shuffle;Lateral trunk lean to right;Decreased trunk rotation;Trunk flexed;Narrow base of support;Decreased step length - left;Decreased step length - right    Ambulation Surface  Level;Indoor    Pre-Gait Activities  Standing beside parallel bars:  forward/back step and weightshift x 10 reps each leg, with focus on increased stride length.  Forward/back hip kicks x 10 reps; lateral weight shifts  with wide BOS x 10 reps    Gait Comments  Practiced carrying weight in RUE to simulate carrying briefcase.  Cues for upright posture.  No episodes of hastening or LOB.      High Level Balance   High Level Balance Comments  MiniBESTest score:  23/38 (improved from 20/28)-see full note for details        Mini-BESTest: Balance Evaluation Systems Test  2005-2013 [place]. All rights reserved. ________________________________________________________________________________________Anticipatory_________Subscore___5__/6 1. SIT TO STAND Instruction: "Cross your arms across your chest. Try not to use your hands unless you must.Do not let your legs lean against the back of the chair when you stand. Please stand up now." X(2) Normal: Comes to stand without use of hands and stabilizes independently. (1) Moderate: Comes to stand WITH use of hands on first attempt. (0) Severe: Unable to stand up from chair without assistance, OR needs several attempts with use of hands. 2. RISE TO TOES Instruction: "Place your feet shoulder width apart. Place your hands on your hips. Try to rise as high as you can onto your toes. I will count out loud to 3 seconds. Try to hold this pose for at least 3 seconds. Look straight ahead. Rise now." X(2) Normal: Stable for 3 s with maximum height. (1) Moderate: Heels up, but not full range (smaller than when holding hands), OR noticeable instability for 3 s. (0) Severe: < 3 s. 3. STAND ON ONE LEG Instruction: "Look straight ahead. Keep your hands on your hips. Lift your leg off of the ground behind you without touching or resting your raised leg upon your other standing leg. Stay standing on one leg as long as you can. Look straight ahead. Lift now." Left: Time in Seconds Trial 1:__3.62___Trial 2:___7.32__ (2) Normal: 20 s. X(1) Moderate: < 20 s. (0) Severe: Unable. Right: Time in Seconds Trial 1:__20___Trial 2:_____ X(2) Normal: 20 s. (1)  Moderate: < 20 s. (0) Severe: Unable To score each side separately use the trial with the longest time. To calculate the sub-score and total score use the side [left or right] with the lowest numerical score [i.e. the worse side]. ______________________________________________________________________________________Reactive Postural Control___________Subscore:__5___/6 4. COMPENSATORY STEPPING CORRECTION- FORWARD Instruction: "Stand with your feet shoulder width apart, arms at your sides. Lean forward against my hands beyond your forward limits. When I let go, do whatever is necessary, including taking a step, to avoid a fall." X(2) Normal: Recovers independently with a single, large step (second realignment step is allowed). (1) Moderate: More than one step used to recover equilibrium. (0) Severe: No step, OR would fall if not caught, OR falls  spontaneously. 5. COMPENSATORY STEPPING CORRECTION- BACKWARD Instruction: "Stand with your feet shoulder width apart, arms at your sides. Lean backward against my hands beyond your backward limits. When I let go, do whatever is necessary, including taking a step, to avoid a fall." X(2) Normal: Recovers independently with a single, large step. (1) Moderate: More than one step used to recover equilibrium. (0) Severe: No step, OR would fall if not caught, OR falls spontaneously. 6. COMPENSATORY STEPPING CORRECTION- LATERAL Instruction: "Stand with your feet together, arms down at your sides. Lean into my hand beyond your sideways limit. When I let go, do whatever is necessary, including taking a step, to avoid a fall." Left (2) Normal: Recovers independently with 1 step (crossover or lateral OK). X(1) Moderate: Several steps to recover equilibrium. (0) Severe: Falls, or cannot step. Right (2) Normal: Recovers independently with 1 step (crossover or lateral OK). X(1) Moderate: Several steps to recover equilibrium. (0) Severe: Falls, or cannot  step. Use the side with the lowest score to calculate sub-score and total score. ____________________________________________________________________________________Sensory Orientation_____________Subscore:______5___/6 7. STANCE (FEET TOGETHER); EYES OPEN, FIRM SURFACE Instruction: "Place your hands on your hips. Place your feet together until almost touching. Look straight ahead. Be as stable and still as possible, until I say stop." Time in seconds:________ X(2) Normal: 30 s. (1) Moderate: < 30 s. (0) Severe: Unable. 8. STANCE (FEET TOGETHER); EYES CLOSED, FOAM SURFACE Instruction: "Step onto the foam. Place your hands on your hips. Place your feet together until almost touching. Be as stable and still as possible, until I say stop. I will start timing when you close your eyes." Time in seconds:__4_sec (LOB to R side, needing therapist assist to regain)____ (2) Normal: 30 s. X(1) Moderate: < 30 s. (0) Severe: Unable. 9. INCLINE- EYES CLOSED Instruction: "Step onto the incline ramp. Please stand on the incline ramp with your toes toward the top. Place your feet shoulder width apart and have your arms down at your sides. I will start timing when you close your eyes." Time in seconds:________ X2) Normal: Stands independently 30 s and aligns with gravity. (1) Moderate: Stands independently <30 s OR aligns with surface. (0) Severe: Unable. _________________________________________________________________________________________Dynamic Gait ______Subscore______8__/10 10. CHANGE IN GAIT SPEED Instruction: "Begin walking at your normal speed, when I tell you 'fast', walk as fast as you can. When I say 'slow', walk very slowly." X(2) Normal: Significantly changes walking speed without imbalance. (1) Moderate: Unable to change walking speed or signs of imbalance. (0) Severe: Unable to achieve significant change in walking speed AND signs of imbalance. [place] -  HORIZONTAL Instruction: "Begin walking at your normal speed, when I say "right", turn your head and look to the right. When I say "left" turn your head and look to the left. Try to keep yourself walking in a straight line." (2) Normal: performs head turns with no change in gait speed and good balance. X(1) Moderate: performs head turns with reduction in gait speed. (0) Severe: performs head turns with imbalance. 12. WALK WITH PIVOT TURNS Instruction: "Begin walking at your normal speed. When I tell you to 'turn and stop', turn as quickly as you can, face the opposite direction, and stop. After the turn, your feet should be close together." X(2) Normal: Turns with feet close FAST (< 3 steps) with good balance. (1) Moderate: Turns with feet close SLOW (>4 steps) with good balance. (0) Severe: Cannot turn with feet close at any speed without imbalance. 13. STEP OVER OBSTACLES  Instruction: "Begin walking at your normal speed. When you get to the box, step over it, not around it and keep walking." (2) Normal: Able to step over box with minimal change of gait speed and with good balance. X(1) Moderate: Steps over box but touches box OR displays cautious behavior by slowing gait. (0) Severe: Unable to step over box OR steps around box. 14. TIMED UP & GO WITH DUAL TASK [3 METER WALK] Instruction TUG: "When I say 'Go', stand up from chair, walk at your normal speed across the tape on the floor, turn around, and come back to sit in the chair." Instruction TUG with Dual Task: "Count backwards by threes starting at ___. When I say 'Go', stand up from chair, walk at your normal speed across the tape on the floor, turn around, and come back to sit in the chair. Continue counting backwards the entire time." TUG: ____10.52____seconds; Dual Task TUG: ___10.84_____seconds X(2) Normal: No noticeable change in sitting, standing or walking while backward counting when compared to TUG without Dual Task. (1)  Moderate: Dual Task affects either counting OR walking (>10%) when compared to the TUG without Dual Task. (0) Severe: Stops counting while walking OR stops walking while counting. When scoring item 14, if subject's gait speed slows more than 10% between the TUG without and with a Dual Task the score should be decreased by a point. TOTAL SCORE: _____23___/28       PT Education - 11/25/18 0857    Education Details  Addition to HEP for forward/back step and weightshift    Person(s) Educated  Patient    Methods  Explanation;Demonstration;Verbal cues;Handout    Comprehension  Verbalized understanding;Returned demonstration       PT Short Term Goals - 11/25/18 0805      PT SHORT TERM GOAL #1   Title  Patient will be independent with initial HEP for balance and strengthening in order to build upon functional gains in therapy. ALL STG TARGET DATE 11/19/18    Baseline  Pt demonstrates HEP, but does not report doing consistently    Time  4    Period  Weeks    Status  Partially Met    Target Date  11/19/18      PT SHORT TERM GOAL #2   Title  Patient will verbalize fall prevention strategies in the home in order to decrease risk of future falls.    Time  4    Period  Weeks    Status  On-going      PT SHORT TERM GOAL #3   Title  Patient will improve miniBEST score to at least a 22/28 in order to demonstrate decreased fall risk.    Baseline  20/28 on 10/20/18    Time  4    Period  Weeks    Status  Achieved      PT SHORT TERM GOAL #4   Title  Patient will perform 5 x sit to stand from 18" chair with no UE support demonstrating no posterior LOB.    Baseline  episode of posterior LOB and decreased eccentric control when lowering    Time  4    Period  Weeks    Status  Achieved        PT Long Term Goals - 10/20/18 1320      PT LONG TERM GOAL #1   Title  Patient will be independent with final HEP in order to build upon functional gains made in therapy. ALL TARGET  LTG DATE 12/19/18     Time  8    Period  Weeks    Status  New    Target Date  12/19/18      PT LONG TERM GOAL #2   Title  Patient will improve miniBEST score to at least a 24/28 in order to decrease risk of future falls.    Baseline  20/28 on 10/20/18    Time  8    Period  Weeks    Status  New      PT LONG TERM GOAL #3   Title  Patient will ambulate at least 39' with increased reciprocal arm swing and larger step lengths with no verbal cueing from therapist in order to improve gait mechanics.    Time  8    Period  Weeks    Status  New      PT LONG TERM GOAL #4   Title  Patient will demonstrate fluid gait with walking sticks mod I with no verbal cueing in order to increase safety while ambulating in his neighborhood.    Time  8    Period  Weeks    Status  New      PT LONG TERM GOAL #5   Title  Patient will perform sit to stand from <18" chair with no UE support and no posterior LOB upon standing in order to increase safety.    Time  8    Period  Weeks    Status  New      Additional Long Term Goals   Additional Long Term Goals  Yes      PT LONG TERM GOAL #6   Title  Patient will ambulate 1,000' outside over uneven surfaces/inclines with no AD/LRAD and mod I in order to return to prior level of fitness in the community/neighborhood.    Time  8    Period  Weeks    Status  New            Plan - 11/25/18 4076    Clinical Impression Statement  Assessed STGs this visit, with pt meeting STG 3 and 4 for improved 5x sit<>stand and MiniBESTest; STG 1 not met for HEP; STG ongoing-unable to fully address this visit due to time constraints.  Pt demonstrates improved awareness of transfer technique and improved balance with gait; however, he does note that balance, posture, hastening episodes worsen at end of day.  Utilized compliant surfaces to help with foot clearance with gait during session today.  Pt will continue to benefit from skilled PT to progress towards LTGs.    Personal Factors and  Comorbidities  Comorbidity 1;Past/Current Experience    Comorbidities  PD    Examination-Activity Limitations  Locomotion Level    Examination-Participation Restrictions  Other   Exercise (previously liked to jog)   Stability/Clinical Decision Making  Evolving/Moderate complexity    Rehab Potential  Good    PT Frequency  2x / week    PT Duration  8 weeks    PT Treatment/Interventions  ADLs/Self Care Home Management;Therapeutic exercise;Therapeutic activities;Functional mobility training;Stair training;Gait training;DME Instruction;Balance training;Neuromuscular re-education;Patient/family education    PT Next Visit Plan  Review HEP additions; gait on compliant surfaces, carrying object like briefcase; sit<>stand from rolling chairs (like office chair); use of metronome, walking poles for gait; Need to address fall prevention education    Consulted and Agree with Plan of Care  Patient       Patient will benefit from skilled therapeutic intervention in  order to improve the following deficits and impairments:  Abnormal gait, Decreased balance, Decreased coordination, Decreased range of motion, Decreased mobility, Decreased strength, Difficulty walking, Postural dysfunction, Decreased activity tolerance  Visit Diagnosis: Other abnormalities of gait and mobility  Abnormal posture     Problem List Patient Active Problem List   Diagnosis Date Noted  . Parkinson disease ([place])     [name],[name] 11/25/2018, 9:05 AM  [facility]ier Butt., PT   [facility] [address] [place], [name], [address] Phone: [phone]   Fax:  [phone]  Name: Ronald Noble MRN: [id number] Date of Birth: 1945-07-27

## 2018-11-25 NOTE — Patient Instructions (Signed)
Provided PWR! Handout for forward/back step and weightshift, x 10 reps each

## 2018-11-28 ENCOUNTER — Encounter: Payer: Self-pay | Admitting: Physical Therapy

## 2018-11-28 ENCOUNTER — Other Ambulatory Visit: Payer: Self-pay

## 2018-11-28 ENCOUNTER — Ambulatory Visit: Payer: Medicare Other | Admitting: Physical Therapy

## 2018-11-28 DIAGNOSIS — R2689 Other abnormalities of gait and mobility: Secondary | ICD-10-CM

## 2018-11-28 DIAGNOSIS — R293 Abnormal posture: Secondary | ICD-10-CM | POA: Diagnosis not present

## 2018-11-28 DIAGNOSIS — R2681 Unsteadiness on feet: Secondary | ICD-10-CM | POA: Diagnosis not present

## 2018-11-28 DIAGNOSIS — R29898 Other symptoms and signs involving the musculoskeletal system: Secondary | ICD-10-CM | POA: Diagnosis not present

## 2018-11-28 DIAGNOSIS — R29818 Other symptoms and signs involving the nervous system: Secondary | ICD-10-CM | POA: Diagnosis not present

## 2018-11-28 DIAGNOSIS — R278 Other lack of coordination: Secondary | ICD-10-CM | POA: Diagnosis not present

## 2018-11-28 NOTE — Patient Instructions (Signed)
When you begin to get faster and more forward posture:  Stop and PWR! Up (bend your knees for gentle squat, then stand up with best upright posture)  PWR! Rock side to side with feet shoulder width apart until you are ready to start walking again  Start walking with BIG steps, BIG effort to clear the floor and stay with upright posture  YOUR GOAL IS TO NOT REACH OUT FOR DOORWAYS TO STOP YOU!

## 2018-11-28 NOTE — Therapy (Signed)
[facility] [address] [place], [name], [address] Phone: [phone]   Fax:  [phone]  Physical Therapy Treatment  Patient Details  Name: Ronald Noble MRN: [id number] Date of Birth: [birth year] Referring Provider (PT): Dr. Star Age   Encounter Date: 11/28/2018  CLINIC OPERATION CHANGES: [facility] is open at lower capacity following universal masking, social distancing, and patient screening.  The patient's COVID risk of complications score is 3.   PT End of Session - 11/28/18 1110    Visit Number  7    Number of Visits  17    Date for PT Re-Evaluation  12/19/18    Authorization Type  Medicare & BCBS - 10th visit PN    PT Start Time  0716    PT Stop Time  0757    PT Time Calculation (min)  41 min    Activity Tolerance  Patient tolerated treatment well    Behavior During Therapy  [facility] for tasks assessed/performed       Past Medical History:  Diagnosis Date  . BPH (benign prostatic hyperplasia)   . Parkinson disease ([place])   . Skin cancer of face    arms and back  . Sleep apnea    mild  . Spider bite    brown recluse/ left ankle/08/13    Past Surgical History:  Procedure Laterality Date  . BURR HOLE W/ STEREOTACTIC INSERTION OF DBS LEADS / INTRAOP MICROELECTRODE RECORDING  04/2014  . COLONOSCOPY    . KNEE SURGERY     exploratory/left  . SKIN CANCER EXCISION  01/2014   face  . TRANSURETHRAL RESECTION OF PROSTATE     2008    There were no vitals filed for this visit.  Subjective Assessment - 11/28/18 0719    Subjective  Was working in the yard yesterday and fell, hitting my knee on a paver.  Got up fine, no problem with the knee other than a little swelling.    Patient Stated Goals  wants to improve his flexibility and posture, strengthen legs, walking    Currently in Pain?  Yes    Pain Score  2     Pain Location  --   hamstring   Pain Orientation  Right    Pain Onset   In the past 7 days    Pain Frequency  Intermittent    Aggravating Factors   certain movements    Pain Relieving Factors  comes and goes                       [facility] Adult PT Treatment/Exercise - 11/28/18 0001      Transfers   Transfers  Sit to Stand;Stand to Sit    Sit to Stand  6: Modified independent (Device/Increase time);Without upper extremity assist;From chair/3-in-1    Stand to Sit  6: Modified independent (Device/Increase time);Without upper extremity assist;To chair/3-in-1    Number of Reps  2 sets;10 reps   from mat, 18" chair,    Comments  Additional 5 reps from lower surface, cushioned surface, with good, deliberate pace.      Ambulation/Gait   Ambulation/Gait  Yes    Ambulation/Gait Assistance  5: Supervision    Ambulation Distance (Feet)  600 Feet   plus short distance negotiation of obstacles   Assistive device  None    Gait Pattern  Decreased arm swing - right;Shuffle;Lateral trunk lean to right;Decreased trunk rotation;Trunk flexed;Narrow base of support;Decreased step  length - left;Decreased step length - right    Ambulation Surface  Level;Indoor    Gait Comments  Gait negotiating red/blue compliant surfaces for improved foot clearance, then progressing to stepping over/on obstacles to increase foot clearance/dynamic SLS, then gait in gym area, with varied speeds, stopping/starting gait (with cues for PWR! Up, PWR! Rock then initiating gait again with big steps)      High Level Balance   High Level Balance Comments  Reviewed forward<>back step and weigthshift x 10 reps        [facility]) - 11/28/18 0732    PWR! exercises  Moves in standing    PWR! Up  Standing x 10 reps    PWR! Rock  x 10 reps each side    PWR! Twist  x 10 reps each side    PWR Step  x 10 reps each side    Comments  Reiterated importance of standing PWR! Moves exercises and relation of each exercise to function          PT Education - 11/28/18 1109    Education Details   Sequence to lessen/avoid hastening/forward flexed episodes with gait    Person(s) Educated  Patient    Methods  Explanation;Demonstration;Handout;Verbal cues    Comprehension  Verbalized understanding;Returned demonstration;Verbal cues required       PT Short Term Goals - 11/25/18 0805      PT SHORT TERM GOAL #1   Title  Patient will be independent with initial HEP for balance and strengthening in order to build upon functional gains in therapy. ALL STG TARGET DATE 11/19/18    Baseline  Pt demonstrates HEP, but does not report doing consistently    Time  4    Period  Weeks    Status  Partially Met    Target Date  11/19/18      PT SHORT TERM GOAL #2   Title  Patient will verbalize fall prevention strategies in the home in order to decrease risk of future falls.    Time  4    Period  Weeks    Status  On-going      PT SHORT TERM GOAL #3   Title  Patient will improve miniBEST score to at least a 22/28 in order to demonstrate decreased fall risk.    Baseline  20/28 on 10/20/18    Time  4    Period  Weeks    Status  Achieved      PT SHORT TERM GOAL #4   Title  Patient will perform 5 x sit to stand from 18" chair with no UE support demonstrating no posterior LOB.    Baseline  episode of posterior LOB and decreased eccentric control when lowering    Time  4    Period  Weeks    Status  Achieved        PT Long Term Goals - 10/20/18 1320      PT LONG TERM GOAL #1   Title  Patient will be independent with final HEP in order to build upon functional gains made in therapy. ALL TARGET LTG DATE 12/19/18    Time  8    Period  Weeks    Status  New    Target Date  12/19/18      PT LONG TERM GOAL #2   Title  Patient will improve miniBEST score to at least a 24/28 in order to decrease risk of future falls.    Baseline  20/28  on 10/20/18    Time  8    Period  Weeks    Status  New      PT LONG TERM GOAL #3   Title  Patient will ambulate at least 79' with increased reciprocal arm swing  and larger step lengths with no verbal cueing from therapist in order to improve gait mechanics.    Time  8    Period  Weeks    Status  New      PT LONG TERM GOAL #4   Title  Patient will demonstrate fluid gait with walking sticks mod I with no verbal cueing in order to increase safety while ambulating in his neighborhood.    Time  8    Period  Weeks    Status  New      PT LONG TERM GOAL #5   Title  Patient will perform sit to stand from <18" chair with no UE support and no posterior LOB upon standing in order to increase safety.    Time  8    Period  Weeks    Status  New      Additional Long Term Goals   Additional Long Term Goals  Yes      PT LONG TERM GOAL #6   Title  Patient will ambulate 1,000' outside over uneven surfaces/inclines with no AD/LRAD and mod I in order to return to prior level of fitness in the community/neighborhood.    Time  8    Period  Weeks    Status  New            Plan - 11/28/18 1110    Clinical Impression Statement  Reviewed HEP this visit, with pt return demonstrating understanding.  Pt reports doing the exercises "some" at home.  Educated patient on importance of consistent performance of HEP and focused time during session with repetition of deliberate movement patterns and strategies to avoid hastening episodes with gait.  Pt feels he is ready for d/c next visit.    Personal Factors and Comorbidities  Comorbidity 1;Past/Current Experience    Comorbidities  PD    Examination-Activity Limitations  Locomotion Level    Examination-Participation Restrictions  Other   Exercise (previously liked to jog)   Stability/Clinical Decision Making  Evolving/Moderate complexity    Rehab Potential  Good    PT Frequency  2x / week    PT Duration  8 weeks    PT Treatment/Interventions  ADLs/Self Care Home Management;Therapeutic exercise;Therapeutic activities;Functional mobility training;Stair training;Gait training;DME Instruction;Balance  training;Neuromuscular re-education;Patient/family education    PT Next Visit Plan  Check LTGs, as pt reports he feels ready for d/c; will need to make sure to review fall prevention education    Consulted and Agree with Plan of Care  Patient       Patient will benefit from skilled therapeutic intervention in order to improve the following deficits and impairments:  Abnormal gait, Decreased balance, Decreased coordination, Decreased range of motion, Decreased mobility, Decreased strength, Difficulty walking, Postural dysfunction, Decreased activity tolerance  Visit Diagnosis: Abnormal posture  Other abnormalities of gait and mobility     Problem List Patient Active Problem List   Diagnosis Date Noted  . Parkinson disease ([place])     Brynda Heick W. 11/28/2018, 11:15 AM  Frazier Butt., PT   [facility] [address] [place], [name], [address] Phone: [phone]   Fax:  [phone]  Name: Robel Wuertz MRN: [id number] Date of Birth:  04/19/1945   

## 2018-11-30 ENCOUNTER — Other Ambulatory Visit: Payer: Self-pay

## 2018-11-30 ENCOUNTER — Ambulatory Visit: Payer: Medicare Other | Admitting: Physical Therapy

## 2018-11-30 DIAGNOSIS — R29818 Other symptoms and signs involving the nervous system: Secondary | ICD-10-CM | POA: Diagnosis not present

## 2018-11-30 DIAGNOSIS — R293 Abnormal posture: Secondary | ICD-10-CM

## 2018-11-30 DIAGNOSIS — R2689 Other abnormalities of gait and mobility: Secondary | ICD-10-CM

## 2018-11-30 DIAGNOSIS — R29898 Other symptoms and signs involving the musculoskeletal system: Secondary | ICD-10-CM | POA: Diagnosis not present

## 2018-11-30 DIAGNOSIS — R278 Other lack of coordination: Secondary | ICD-10-CM | POA: Diagnosis not present

## 2018-11-30 DIAGNOSIS — R2681 Unsteadiness on feet: Secondary | ICD-10-CM | POA: Diagnosis not present

## 2018-11-30 NOTE — Therapy (Signed)
Ronald Noble [address] [place], [name], [address] Phone: [phone]   Fax:  [phone]  Physical Therapy Treatment  Patient Details  Name: Ronald Noble MRN: [id number] Date of Birth: [birth year] Referring Provider (PT): Dr. Star Age   Encounter Date: 11/30/2018  CLINIC OPERATION CHANGES: Outpatient [facility] Rehab is open at lower capacity following universal masking, social distancing, and patient screening.  The patient's COVID risk of complications score is 3.   PT End of Session - 11/30/18 2058    Visit Number  8    Number of Visits  17    Date for PT Re-Evaluation  12/19/18    Authorization Type  Medicare & BCBS - 10th visit PN    PT Start Time  0805    PT Stop Time  5056   d/c visit-goals checked and pt d/cd   PT Time Calculation (min)  30 min    Activity Tolerance  Patient tolerated treatment well    Behavior During Therapy  [facility] for tasks assessed/performed       Past Medical History:  Diagnosis Date  . BPH (benign prostatic hyperplasia)   . Parkinson disease ([place])   . Skin cancer of face    arms and back  . Sleep apnea    mild  . Spider bite    brown recluse/ left ankle/08/13    Past Surgical History:  Procedure Laterality Date  . BURR HOLE W/ STEREOTACTIC INSERTION OF DBS LEADS / INTRAOP MICROELECTRODE RECORDING  04/2014  . COLONOSCOPY    . KNEE SURGERY     exploratory/left  . SKIN CANCER EXCISION  01/2014   face  . TRANSURETHRAL RESECTION OF PROSTATE     2008    There were no vitals filed for this visit.  Subjective Assessment - 11/30/18 0810    Subjective  Feel stiff today.  Not really back to walking in the neighborhood yet.    Patient Stated Goals  wants to improve his flexibility and posture, strengthen legs, walking    Pain Onset  In the past 7 days                       [facility] Adult PT Treatment/Exercise - 11/30/18 0001      Transfers   Transfers   Sit to Stand;Stand to Sit    Sit to Stand  6: Modified independent (Device/Increase time);Without upper extremity assist;From chair/3-in-1    Stand to Sit  6: Modified independent (Device/Increase time);Without upper extremity assist;To chair/3-in-1    Number of Reps  1 set;Other reps (comment)   5 reps from 16" surface     Ambulation/Gait   Ambulation/Gait  Yes    Ambulation/Gait Assistance  6: Modified independent (Device/Increase time)    Ambulation/Gait Assistance Details  With outdoor gait with bilateral poles, pt demonstrates good sequence, but needs repeated cues to SLOW pace    Ambulation Distance (Feet)  1000 Feet   230   Assistive device  None   bilateral walking poles   Gait Pattern  Decreased arm swing - right;Shuffle;Lateral trunk lean to right;Decreased trunk rotation;Trunk flexed;Narrow base of support;Decreased step length - left;Decreased step length - right    Ambulation Surface  Level;Indoor    Gait Comments  REview of strategies to lessen hastening of gait:  practiced gait x 400 ft indoors, with quick stops/starts, cues for STOP, squat, PWR! Up and rock to start again, last 2 attempts, pt able to perform  without cues.        High Level Balance   High Level Balance Comments  Reviewed forward<>back step and weigthshift x 10 reps-return demo understanding           Reiterated again the importance of consistent performance of HEP to establish and reinforce optimal movement patterns for decreased fall risk.  PT Education - 11/30/18 2057    Education Details  progress towards goals, POC and plans for d/c    Person(s) Educated  Patient    Methods  Explanation    Comprehension  Verbalized understanding       PT Short Term Goals - 11/25/18 0805      PT SHORT TERM GOAL #1   Title  Patient will be independent with initial HEP for balance and strengthening in order to build upon functional gains in therapy. ALL STG TARGET DATE 11/19/18    Baseline  Pt demonstrates HEP,  but does not report doing consistently    Time  4    Period  Weeks    Status  Partially Met    Target Date  11/19/18      PT SHORT TERM GOAL #2   Title  Patient will verbalize fall prevention strategies in the home in order to decrease risk of future falls.    Time  4    Period  Weeks    Status  On-going      PT SHORT TERM GOAL #3   Title  Patient will improve miniBEST score to at least a 22/28 in order to demonstrate decreased fall risk.    Baseline  20/28 on 10/20/18    Time  4    Period  Weeks    Status  Achieved      PT SHORT TERM GOAL #4   Title  Patient will perform 5 x sit to stand from 18" chair with no UE support demonstrating no posterior LOB.    Baseline  episode of posterior LOB and decreased eccentric control when lowering    Time  4    Period  Weeks    Status  Achieved        PT Long Term Goals - 11/30/18 2053      PT LONG TERM GOAL #1   Title  Patient will be independent with final HEP in order to build upon functional gains made in therapy. ALL TARGET LTG DATE 12/19/18    Time  8    Period  Weeks    Status  Achieved      PT LONG TERM GOAL #2   Title  Patient will improve miniBEST score to at least a 24/28 in order to decrease risk of future falls.    Baseline  20/28 on 10/20/18; 23/28    Time  8    Period  Weeks    Status  Not Met      PT LONG TERM GOAL #3   Title  Patient will ambulate at least 115' with increased reciprocal arm swing and larger step lengths with no verbal cueing from therapist in order to improve gait mechanics.    Time  8    Period  Weeks    Status  Achieved      PT LONG TERM GOAL #4   Title  Patient will demonstrate fluid gait with walking sticks mod I with no verbal cueing in order to increase safety while ambulating in his neighborhood.    Time  8    Period  Weeks    Status  Achieved      PT LONG TERM GOAL #5   Title  Patient will perform sit to stand from <18" chair with no UE support and no posterior LOB upon standing in  order to increase safety.    Time  8    Period  Weeks    Status  Achieved      PT LONG TERM GOAL #6   Title  Patient will ambulate 1,000' outside over uneven surfaces/inclines with no AD/LRAD and mod I in order to return to prior level of fitness in the community/neighborhood.    Time  8    Period  Weeks    Status  Achieved            Plan - 11/30/18 2059    Clinical Impression Statement  Assessed LTGs this visit, with pt meeting 5 of 6 LTGs.  He did not meet goal for MiniBESTest (checked last week), which had improved but not to goal level.  Pt continues to need occasional cues for pacing with gait, though he has improved during PT sessions.  Focus of sessions has been on strategies that pt can use later in day when hastening episodes are more frequent.  Pt is appropriate for d/c at this time.    Personal Factors and Comorbidities  Comorbidity 1;Past/Current Experience    Comorbidities  PD    Examination-Activity Limitations  Locomotion Level    Examination-Participation Restrictions  Other   Exercise (previously liked to jog)   Stability/Clinical Decision Making  Evolving/Moderate complexity    Rehab Potential  Good    PT Frequency  2x / week    PT Duration  8 weeks    PT Treatment/Interventions  ADLs/Self Care Home Management;Therapeutic exercise;Therapeutic activities;Functional mobility training;Stair training;Gait training;DME Instruction;Balance training;Neuromuscular re-education;Patient/family education    PT Next Visit Plan  D/C this visit, with recommendations for follow-up PT eval in 6 months.    Consulted and Agree with Plan of Care  Patient       Patient will benefit from skilled therapeutic intervention in order to improve the following deficits and impairments:  Abnormal gait, Decreased balance, Decreased coordination, Decreased range of motion, Decreased mobility, Decreased strength, Difficulty walking, Postural dysfunction, Decreased activity tolerance  Visit  Diagnosis: Other abnormalities of gait and mobility  Abnormal posture  Other symptoms and signs involving the nervous system     Problem List Patient Active Problem List   Diagnosis Date Noted  . Parkinson disease ([place])     Aujanae Mccullum W. 11/30/2018, 9:13 PM  Frazier Butt., PT   [facility] [address] [place], [name], [address] Phone: [phone]   Fax:  [phone]  Name: Ronald Noble MRN: [id number] Date of Birth: 10-24-1945   PHYSICAL THERAPY DISCHARGE SUMMARY  Visits from Start of Care: 8  Current functional level related to goals / functional outcomes: PT Long Term Goals - 11/30/18 2053      PT LONG TERM GOAL #1   Title  Patient will be independent with final HEP in order to build upon functional gains made in therapy. ALL TARGET LTG DATE 12/19/18    Time  8    Period  Weeks    Status  Achieved      PT LONG TERM GOAL #2   Title  Patient will improve miniBEST score to at least a 24/28 in order to decrease risk of future falls.    Baseline  20/28  on 10/20/18; 23/28    Time  8    Period  Weeks    Status  Not Met      PT LONG TERM GOAL #3   Title  Patient will ambulate at least 115' with increased reciprocal arm swing and larger step lengths with no verbal cueing from therapist in order to improve gait mechanics.    Time  8    Period  Weeks    Status  Achieved      PT LONG TERM GOAL #4   Title  Patient will demonstrate fluid gait with walking sticks mod I with no verbal cueing in order to increase safety while ambulating in his neighborhood.    Time  8    Period  Weeks    Status  Achieved      PT LONG TERM GOAL #5   Title  Patient will perform sit to stand from <18" chair with no UE support and no posterior LOB upon standing in order to increase safety.    Time  8    Period  Weeks    Status  Achieved      PT LONG TERM GOAL #6   Title  Patient will ambulate 1,000' outside over  uneven surfaces/inclines with no AD/LRAD and mod I in order to return to prior level of fitness in the community/neighborhood.    Time  8    Period  Weeks    Status  Achieved     Pt has met 5 of 6 LTGs   Remaining deficits: Abnormal posture, decreased timing and coordination of gait, hastening episodes (worse in pm, per pt report)   Education / Equipment: Educated in [employer], fall prevention, tips to reduce hastening episodes  Plan: Patient agrees to discharge.  Patient goals were met. Patient is being discharged due to meeting the stated rehab goals.  ?????Pt is pleased with his current function and requests to be discharged.  Mady Haagensen, PT 11/30/18 9:16 PM Phone: [phone] Fax: [phone]

## 2018-12-06 ENCOUNTER — Ambulatory Visit (INDEPENDENT_AMBULATORY_CARE_PROVIDER_SITE_OTHER): Payer: Medicare Other | Admitting: Orthopaedic Surgery

## 2018-12-06 ENCOUNTER — Other Ambulatory Visit: Payer: Self-pay

## 2018-12-06 ENCOUNTER — Ambulatory Visit (INDEPENDENT_AMBULATORY_CARE_PROVIDER_SITE_OTHER): Payer: Medicare Other

## 2018-12-06 ENCOUNTER — Encounter: Payer: Self-pay | Admitting: Orthopaedic Surgery

## 2018-12-06 VITALS — BP 117/67 | HR 69 | Ht 71.0 in | Wt 155.0 lb

## 2018-12-06 DIAGNOSIS — M25562 Pain in left knee: Secondary | ICD-10-CM

## 2018-12-06 DIAGNOSIS — M79604 Pain in right leg: Secondary | ICD-10-CM

## 2018-12-06 DIAGNOSIS — M7052 Other bursitis of knee, left knee: Secondary | ICD-10-CM

## 2018-12-06 NOTE — Addendum Note (Signed)
Addended by: Lendon Collar on: 12/06/2018 04:27 PM   Modules accepted: Orders

## 2018-12-06 NOTE — Progress Notes (Signed)
Office Visit Note   Patient: Ronald Noble           Date of Birth: [birth year]           MRN: [id number] Visit Date: 12/06/2018              Requested by: [name], MD [address] [place],  [place] [address] PCP: [name], MD   Assessment & Plan: Visit Diagnoses:  1. Acute pain of left knee   2. Other bursitis of knee, left knee   3. Pain in right leg     Plan: Right leg pain appears to be related to his hamstring.  I think it is worth having physical therapy evaluate and treat.  He does not appear to have any radicular pain.  Has a small prepatellar bursa left knee that does not appear to be infected.  There is minimal amount of fluid and minimal discomfort.  We will give this more time to resolve and then consider the Voltaren gel.  Also may consider aspiration and injection with cortisone if no improvement over the next 3 to 4 weeks  Follow-Up Instructions: Return if symptoms worsen or fail to improve.   Orders:  Orders Placed This Encounter  Procedures   XR KNEE 3 VIEW LEFT   No orders of the defined types were placed in this encounter.     Procedures: No procedures performed   Clinical Data: No additional findings.   Subjective: Chief Complaint  Patient presents with   Right Leg - Pain  Patient presents today with right leg pain that started around 7-8 weeks ago. He lost his balance upon standing and fell. He went to his PCP 6 weeks ago and had x-rays taken of his right hip on 10/24/2018. He states that he has pain on the backside of his thigh. He fell again about two weeks ago and hit his left knee on a paver. His knee has been swollen and painful since. His left knee is painful on the anterior side. He takes tylenol as needed. Ronald Noble has a history of Parkinson's with a brain stimulator implant in his left chest wall.  He does have frequent falls and about 6 weeks ago fell landing on his right hip.  X-rays at the time were negative.  I reviewed  these on the back system and agree.  He has been having some pain behind his right thigh since that time but no swelling or ecchymosis.  He also has had a fall directly on the anterior aspect of his left knee and is a little bit of swelling and some mild pain.  He is not on any blood thinners  HPI  Review of Systems  Constitutional: Negative for fatigue.  HENT: Negative for ear pain.   Eyes: Negative for pain.  Respiratory: Negative for shortness of breath.   Cardiovascular: Negative for leg swelling.  Gastrointestinal: Negative for constipation and diarrhea.  Endocrine: Negative for cold intolerance and heat intolerance.  Genitourinary: Negative for difficulty urinating.  Musculoskeletal: Positive for joint swelling.  Skin: Negative for rash.  Allergic/Immunologic: Negative for food allergies.  Neurological: Positive for weakness.  Hematological: Does not bruise/bleed easily.  Psychiatric/Behavioral: Positive for sleep disturbance.     Objective: Vital Signs: BP 117/67    Pulse 69    Ht 5\' 11"  (1.803 m)    Wt 155 lb (70.3 kg)    BMI 21.62 kg/m   Physical Exam Constitutional:      Appearance: He  is well-developed.  Eyes:     Pupils: Pupils are equal, round, and reactive to light.  Pulmonary:     Effort: Pulmonary effort is normal.  Skin:    General: Skin is warm and dry.  Neurological:     Mental Status: He is alert and oriented to person, place, and time.  Psychiatric:        Behavior: Behavior normal.     Ortho Exam has a short based gait related to his Parkinson's.  He did not have any pain with hip motion or on either greater trochanter.  Does have some very minimal discomfort in the mid right hamstring with certain motions.  There did not appear to be any difference in appearance from his right to his left hamstring.  There was some ecchymosis in his left popliteal fossa but no particular pain.  On the left knee there was a very small prepatellar bursa.  It was a  minimally warm but not hot and not red.  Could actively extend both of his legs and maintain apposition.  Some mild venous stasis changes in both ankles.  Mild left ankle swelling.  Straight leg raise negative.  Specialty Comments:  No specialty comments available.  Imaging: Xr Knee 3 View Left  Result Date: 12/06/2018 Films of the left knee were obtained in several projections standing.'s very minimal decrease in the medial joint space.  No evidence of a fracture.  No ectopic calcification.  Some patellofemoral arthritic changes with small spur formation but joint space is still well-maintained    PMFS History: Patient Active Problem List   Diagnosis Date Noted   Other bursitis of knee, left knee 12/06/2018   Pain in right leg 12/06/2018   Parkinson disease ([place])    Past Medical History:  Diagnosis Date   BPH (benign prostatic hyperplasia)    Parkinson disease ([place])    Skin cancer of face    arms and back   Sleep apnea    mild   Spider bite    brown recluse/ left ankle/08/13    Family History  Problem Relation Age of Onset   COPD Mother    Cancer Father        Bladder   Colon cancer Neg Hx    Esophageal cancer Neg Hx    Stomach cancer Neg Hx     Past Surgical History:  Procedure Laterality Date   BURR HOLE W/ STEREOTACTIC INSERTION OF DBS LEADS / INTRAOP MICROELECTRODE RECORDING  04/2014   COLONOSCOPY     KNEE SURGERY     exploratory/left   SKIN CANCER EXCISION  01/2014   face   TRANSURETHRAL RESECTION OF PROSTATE     2008   Social History   Occupational History   Occupation: LAWYER    Employer: [employer]  Tobacco Use   Smoking status: Never Smoker   Smokeless tobacco: Never Used  Substance and Sexual Activity   Alcohol use: Yes    Alcohol/week: 5.0 standard drinks    Types: 3 Glasses of wine, 2 Cans of beer per week    Comment: occasional   Drug use: No   Sexual activity: Not on file

## 2018-12-19 ENCOUNTER — Ambulatory Visit: Payer: Medicare Other | Attending: Orthopaedic Surgery

## 2018-12-19 ENCOUNTER — Other Ambulatory Visit: Payer: Self-pay

## 2018-12-19 DIAGNOSIS — R29818 Other symptoms and signs involving the nervous system: Secondary | ICD-10-CM | POA: Insufficient documentation

## 2018-12-19 DIAGNOSIS — R2689 Other abnormalities of gait and mobility: Secondary | ICD-10-CM | POA: Diagnosis not present

## 2018-12-19 DIAGNOSIS — M79651 Pain in right thigh: Secondary | ICD-10-CM | POA: Diagnosis not present

## 2018-12-19 DIAGNOSIS — R293 Abnormal posture: Secondary | ICD-10-CM | POA: Diagnosis not present

## 2018-12-19 NOTE — Therapy (Signed)
[place], [name], [address] Phone: [phone]   Fax:  [phone]  Physical Therapy Evaluation  Patient Details  Name: Ronald Noble MRN: [id number] Date of Birth: 25-Nov-1945 Referring Provider (PT): Joni Fears, MD   Encounter Date: 12/19/2018  PT End of Session - 12/19/18 0706    Visit Number  1    Number of Visits  8    Date for PT Re-Evaluation  01/13/19    Authorization Type  Medicare & BCBS - 10th visit PN    PT Start Time  0705    PT Stop Time  0745    PT Time Calculation (min)  40 min    Activity Tolerance  Patient tolerated treatment well    Behavior During Therapy  [facility] for tasks assessed/performed       Past Medical History:  Diagnosis Date  . BPH (benign prostatic hyperplasia)   . Parkinson disease ([place])   . Skin cancer of face    arms and back  . Sleep apnea    mild  . Spider bite    brown recluse/ left ankle/08/13    Past Surgical History:  Procedure Laterality Date  . BURR HOLE W/ STEREOTACTIC INSERTION OF DBS LEADS / INTRAOP MICROELECTRODE RECORDING  04/2014  . COLONOSCOPY    . KNEE SURGERY     exploratory/left  . SKIN CANCER EXCISION  01/2014   face  . TRANSURETHRAL RESECTION OF PROSTATE     2008    There were no vitals filed for this visit.   Subjective Assessment - 12/19/18 0710    Subjective  Falls on Lt knee and RT knee. RT hamstring tight . 5 falls in past few weeks. Hamstring issue for 6-12 months.   REceiving PT in Neuro.    Was going to gym prior to Covid -19.    Hamstring throbbs with sitting.    Pertinent History  PD 2008, bilateral DBS (2018)    Limitations  Other (comment);Walking;Standing;Sitting   bending   How long can you sit comfortably?  15 min    How long can you stand comfortably?  More than sitting    How long can you walk comfortably?  As needed until fatigue    Patient Stated Goals  decrease pain in hamstrings    Pain Score  --    varies from none to moderate   Pain Location  --   RT thigh   Pain Orientation  Right;Posterior    Pain Descriptors / Indicators  Aching;Throbbing    Pain Type  Chronic pain    Pain Onset  More than a month ago    Pain Frequency  Intermittent    Aggravating Factors   standing and sitting    Pain Relieving Factors  comes and goes         [facility] PT Assessment - 12/19/18 0001      Assessment   Medical Diagnosis  RT hamstring pain    Referring Provider (PT)  Joni Fears, MD    Onset Date/Surgical Date  --   6-12 months ago   Next MD Visit  12/22/18    Prior Therapy  PT for Parkinson's      Precautions   Precautions  Fall      Restrictions   Weight Bearing Restrictions  No      Balance Screen   How many times?  >5    Has the patient had a decrease in activity level  because of a fear of falling?   Yes    Is the patient reluctant to leave their home because of a fear of falling?   No      Home Environment   Living Environment  Private residence    Living Arrangements  Spouse/significant other      Prior Function   Level of Independence  --   help with fine motor activity   Vocation  Full time employment    Vocation Requirements  attorney    Leisure  was doing yoga, weights, swimming, exercise bike prior to [employer] closures. during Covid is walking and does yoga classes/stretches at home.      Cognition   Overall Cognitive Status  Within Functional Limits for tasks assessed      Posture/Postural Control   Posture/Postural Control  Postural limitations    Postural Limitations  Rounded Shoulders;Weight shift right;Flexed trunk;Forward head      ROM / Strength   AROM / PROM / Strength  AROM;PROM;Strength      PROM   Overall PROM Comments  LT hip IR < 35 degrees        Strength   Strength Assessment Site  Knee    Right/Left Knee  Right;Left    Right Knee Flexion  5/5    Right Knee Extension  5/5    Left Knee Flexion  5/5    Left Knee Extension  5/5       Flexibility   Soft Tissue Assessment /Muscle Length  yes    Hamstrings  approx 60 degrees .     Quadriceps  90 degrees passive in prone    ITB  tighness    Piriformis  tightness bilaterally      Palpation   SI assessment   stiffness SI sanfd Lower lumbar    Palpation comment  clavicle rises on Lt with shoulder elevation, long Lt leg       Ambulation/Gait   Ambulation/Gait Assistance  6: Modified independent (Device/Increase time)    Gait Pattern  Decreased arm swing - right;Shuffle;Lateral trunk lean to right;Decreased trunk rotation;Trunk flexed;Narrow base of support;Decreased step length - left;Decreased step length - right                Objective measurements completed on examination: See above findings.      [facility] Adult PT Treatment/Exercise - 12/19/18 0001      Manual Therapy   Manual Therapy  Passive ROM;Manual Traction;Muscle Energy Technique    Passive ROM  hip rotation and quads in prone.     Manual Traction  LT leg pulls     Muscle Energy Technique  with hip adduction  in suine             PT Education - 12/19/18 0818    Education Details  POC    Person(s) Educated  Patient    Methods  Explanation    Comprehension  Verbalized understanding       PT Short Term Goals - 11/25/18 0805      PT SHORT TERM GOAL #1   Title  Patient will be independent with initial HEP for balance and strengthening in order to build upon functional gains in therapy. ALL STG TARGET DATE 11/19/18    Baseline  Pt demonstrates HEP, but does not report doing consistently    Time  4    Period  Weeks    Status  Partially Met    Target Date  11/19/18  PT SHORT TERM GOAL #2   Title  Patient will verbalize fall prevention strategies in the home in order to decrease risk of future falls.    Time  4    Period  Weeks    Status  On-going      PT SHORT TERM GOAL #3   Title  Patient will improve miniBEST score to at least a 22/28 in order to demonstrate decreased fall  risk.    Baseline  20/28 on 10/20/18    Time  4    Period  Weeks    Status  Achieved      PT SHORT TERM GOAL #4   Title  Patient will perform 5 x sit to stand from 18" chair with no UE support demonstrating no posterior LOB.    Baseline  episode of posterior LOB and decreased eccentric control when lowering    Time  4    Period  Weeks    Status  Achieved        PT Long Term Goals - 12/19/18 4010      Additional Long Term Goals   Additional Long Term Goals  Yes      PT LONG TERM GOAL #7   Title  He will be independent with all exercises related to RT hamstring pain    Time  4    Period  Weeks    Status  New      PT LONG TERM GOAL #8   Title  He will report able to sit in eveining with 1-2 max pain in RT hamstring.    Time  4    Period  Weeks    Status  New      PT LONG TERM GOAL  #9   TITLE  He will be able to bend over with no hamstring pain    Time  4    Period  Weeks    Status  New             Plan - 12/19/18 0707    Clinical Impression Statement  Mr Buffalo presents with report of RT hamstring pain. This appears related to postureal changes  and abnormal gait and falls.  He has tightness in hips and quads.    He should improved with decr pain  with skilled PT and consistent stretch /HEP .    Personal Factors and Comorbidities  Past/Current Experience;Time since onset of injury/illness/exacerbation    Comorbidities  PD    Examination-Activity Limitations  Locomotion Level;Sit;Carry;Squat;Stand    Examination-Participation Restrictions  --   previously went to gym.   Stability/Clinical Decision Making  Evolving/Moderate complexity    PT Frequency  2x / week    PT Duration  4 weeks    PT Treatment/Interventions  Therapeutic activities;Therapeutic exercise;Patient/family education;Manual techniques;Passive range of motion;Dry needling    PT Next Visit Plan  implanted medica; device NO ELECTRIC STIM ,      Review HEP from neuro. manual for stretching and   MET as  needed    Consulted and Agree with Plan of Care  Patient       Patient will benefit from skilled therapeutic intervention in order to improve the following deficits and impairments:  Abnormal gait, Decreased balance, Decreased coordination, Decreased range of motion, Decreased mobility, Decreased strength, Difficulty walking, Postural dysfunction, Decreased activity tolerance, Pain  Visit Diagnosis: Pain in right thigh - Plan: PT plan of care cert/re-cert  Other abnormalities of gait and mobility - Plan: PT  plan of care cert/re-cert  Abnormal posture - Plan: PT plan of care cert/re-cert     Problem List Patient Active Problem List   Diagnosis Date Noted  . Other bursitis of knee, left knee 12/06/2018  . Pain in right leg 12/06/2018  . Parkinson disease [facility])     Darrel Hoover PT 12/19/2018, 8:26 AM  [facility] [address] [place], [name], [address] Phone: [phone]   Fax:  [phone]  Name: Mosi Hannold MRN: [id number] Date of Birth: 30-Oct-1945

## 2018-12-22 ENCOUNTER — Other Ambulatory Visit: Payer: Self-pay

## 2018-12-22 ENCOUNTER — Ambulatory Visit: Payer: Medicare Other | Admitting: Physical Therapy

## 2018-12-22 ENCOUNTER — Encounter: Payer: Self-pay | Admitting: Physical Therapy

## 2018-12-22 DIAGNOSIS — R2689 Other abnormalities of gait and mobility: Secondary | ICD-10-CM | POA: Diagnosis not present

## 2018-12-22 DIAGNOSIS — R293 Abnormal posture: Secondary | ICD-10-CM | POA: Diagnosis not present

## 2018-12-22 DIAGNOSIS — R29818 Other symptoms and signs involving the nervous system: Secondary | ICD-10-CM | POA: Diagnosis not present

## 2018-12-22 DIAGNOSIS — M79651 Pain in right thigh: Secondary | ICD-10-CM

## 2018-12-22 NOTE — Therapy (Signed)
[place], [name], [address] Phone: [phone]   Fax:  [phone]  Physical Therapy Treatment  Patient Details  Name: Ronald Noble MRN: [id number] Date of Birth: January 12, 1946 Referring Provider (PT): Joni Fears, MD   Encounter Date: 12/22/2018  PT End of Session - 12/22/18 0721    Visit Number  2    Number of Visits  8    Date for PT Re-Evaluation  01/13/19    Authorization Type  Medicare & BCBS - 10th visit PN    PT Start Time  0717    PT Stop Time  0800    PT Time Calculation (min)  43 min       Past Medical History:  Diagnosis Date  . BPH (benign prostatic hyperplasia)   . Parkinson disease ([place])   . Skin cancer of face    arms and back  . Sleep apnea    mild  . Spider bite    brown recluse/ left ankle/08/13    Past Surgical History:  Procedure Laterality Date  . BURR HOLE W/ STEREOTACTIC INSERTION OF DBS LEADS / INTRAOP MICROELECTRODE RECORDING  04/2014  . COLONOSCOPY    . KNEE SURGERY     exploratory/left  . SKIN CANCER EXCISION  01/2014   face  . TRANSURETHRAL RESECTION OF PROSTATE     2008    There were no vitals filed for this visit.  Subjective Assessment - 12/22/18 0721    Subjective  10 minutes of sitting causes increased hamstring pain. It throbbs.    Currently in Pain?  No/denies         [facility] PT Assessment - 12/22/18 0001      Observation/Other Assessments   Focus on Therapeutic Outcomes (FOTO)   51% limited initial                    [facility] Adult PT Treatment/Exercise - 12/22/18 0001      Knee/Hip Exercises: Stretches   Hip Flexor Stretch  60 seconds    Hip Flexor Stretch Limitations  off edge of mat       Knee/Hip Exercises: Supine   Bridges  20 reps    Straight Leg Raises  10 reps    Straight Leg Raises Limitations  fatigues, quad lag       Knee/Hip Exercises: Sidelying   Hip ABduction  10 reps    Hip ABduction Limitations   needs assist bilateral     Clams  x 10 clam, x 10 reverse, bilateral, fatigues with <10 reps      Knee/Hip Exercises: Prone   Hamstring Curl Limitations  alternating hamstring cursl with cues for eccentric control     Hip Extension  10 reps    Hip Extension Limitations  minimal lift bilateral       Manual Therapy   Passive ROM  hip rotation and quads in prone, also right hip all planes supine , hamstring 3 x 30 sec , ITB and adductors                PT Short Term Goals - 11/25/18 0805      PT SHORT TERM GOAL #1   Title  Patient will be independent with initial HEP for balance and strengthening in order to build upon functional gains in therapy. ALL STG TARGET DATE 11/19/18    Baseline  Pt demonstrates HEP, but does not report doing consistently    Time  4  Period  Weeks    Status  Partially Met    Target Date  11/19/18      PT SHORT TERM GOAL #2   Title  Patient will verbalize fall prevention strategies in the home in order to decrease risk of future falls.    Time  4    Period  Weeks    Status  On-going      PT SHORT TERM GOAL #3   Title  Patient will improve miniBEST score to at least a 22/28 in order to demonstrate decreased fall risk.    Baseline  20/28 on 10/20/18    Time  4    Period  Weeks    Status  Achieved      PT SHORT TERM GOAL #4   Title  Patient will perform 5 x sit to stand from 18" chair with no UE support demonstrating no posterior LOB.    Baseline  episode of posterior LOB and decreased eccentric control when lowering    Time  4    Period  Weeks    Status  Achieved        PT Long Term Goals - 12/19/18 7782      Additional Long Term Goals   Additional Long Term Goals  Yes      PT LONG TERM GOAL #7   Title  He will be independent with all exercises related to RT hamstring pain    Time  4    Period  Weeks    Status  New      PT LONG TERM GOAL #8   Title  He will report able to sit in eveining with 1-2 max pain in RT hamstring.    Time   4    Period  Weeks    Status  New      PT LONG TERM GOAL  #9   TITLE  He will be able to bend over with no hamstring pain    Time  4    Period  Weeks    Status  New            Plan - 12/22/18 4235    Clinical Impression Statement  Pt arrives with pain in hamstring with extension of knee. He had increased hamstring pain after the Nustep. Session focused stretching and strengthening of the hips. Hip abduction 3-/5 bilateral. He had no  hamstring upon standint at end of session.    PT Next Visit Plan  implanted medica; device NO ELECTRIC STIM ,      Review HEP from neuro. manual for stretching and   MET as needed, hip strength check hip MMT    PT Home Exercise Plan  Neuro HEP includes: weight shifting prone, seated , standing all with reaching and rotations, strap hamsting , ITB stretch supine       Patient will benefit from skilled therapeutic intervention in order to improve the following deficits and impairments:  Abnormal gait, Decreased balance, Decreased coordination, Decreased range of motion, Decreased mobility, Decreased strength, Difficulty walking, Postural dysfunction, Decreased activity tolerance, Pain  Visit Diagnosis: Pain in right thigh  Other abnormalities of gait and mobility  Abnormal posture     Problem List Patient Active Problem List   Diagnosis Date Noted  . Other bursitis of knee, left knee 12/06/2018  . Pain in right leg 12/06/2018  . Parkinson disease ([place])     Almira Bar 12/22/2018, 8:13 AM  [facility] [address]  [place], [name], [address] Phone: [phone]   Fax:  [phone]  Name: Ronald Noble MRN: [id number] Date of Birth: 04-Mar-1946

## 2018-12-23 ENCOUNTER — Ambulatory Visit (INDEPENDENT_AMBULATORY_CARE_PROVIDER_SITE_OTHER): Payer: Medicare Other

## 2018-12-23 ENCOUNTER — Ambulatory Visit (INDEPENDENT_AMBULATORY_CARE_PROVIDER_SITE_OTHER): Payer: Medicare Other | Admitting: Orthopaedic Surgery

## 2018-12-23 ENCOUNTER — Encounter: Payer: Self-pay | Admitting: Orthopaedic Surgery

## 2018-12-23 VITALS — BP 118/64 | HR 70 | Ht 71.0 in | Wt 155.0 lb

## 2018-12-23 DIAGNOSIS — G8929 Other chronic pain: Secondary | ICD-10-CM

## 2018-12-23 DIAGNOSIS — M79604 Pain in right leg: Secondary | ICD-10-CM

## 2018-12-23 DIAGNOSIS — M545 Low back pain, unspecified: Secondary | ICD-10-CM

## 2018-12-23 NOTE — Progress Notes (Signed)
Office Visit Note   Patient: Ronald Noble           Date of Birth: 12-04-1945           MRN: [id number] Visit Date: 12/23/2018              Requested by: [name], MD [address] [place],  [place] [address] PCP: [name], MD   Assessment & Plan: Visit Diagnoses:  1. Pain in right leg     Plan: Left knee bursitis is presently asymptomatic.  Will monitor.  Right leg pain could be related to his back. there is a possibility that he has stenosis.  Will order CT scan as he has a neurostimulator and pacemaker.  Follow-Up Instructions: No follow-ups on file.   Orders:  Orders Placed This Encounter  Procedures  . XR Lumbar Spine 2-3 Views   No orders of the defined types were placed in this encounter.     Procedures: No procedures performed   Clinical Data: No additional findings.   Subjective: Chief Complaint  Patient presents with  . Left Knee - Follow-up  Patient presents today for follow up on his left knee. He was evaluated on 12/06/2018 for left knee bursitis and pain. He fell again a little over a week ago and landed on his knees. If he flexes his knees it feels like it pulls anteriorly. He has complaints today of pain in his right hamstrings after sitting. He also has some pain in his hip area. Once he gets up and starts walking, the pain subsides.  Frequently has to get up from a sitting position because of the pain in the posterior aspect of his right thigh and occasionally his right leg.  No swelling.  No specific back pain. Left knee bursitis is significantly better  HPI  Review of Systems   Objective: Vital Signs: BP 118/64   Pulse 70   Ht 5\' 11"  (1.803 m)   Wt 155 lb (70.3 kg)   BMI 21.62 kg/m   Physical Exam Constitutional:      Appearance: He is well-developed.  Eyes:     Pupils: Pupils are equal, round, and reactive to light.  Pulmonary:     Effort: Pulmonary effort is normal.  Skin:    General: Skin is warm and dry.   Neurological:     Mental Status: He is alert and oriented to person, place, and time.  Psychiatric:        Behavior: Behavior normal.     Ortho Exam left knee was not hot red or warm.  Prepatellar bursa is minimal and not warm.  It was not fluctuant.  No significant medial lateral joint pain.  No instability.  Straight leg raise negative.  Does have chronic nonpitting edema of his left leg and ankle compared to the right.  No calf pain.  Hamstrings are tight bilaterally.  Painless range of motion of both hips no pain about the right knee or effusion.  Straight leg raise negative for back pain and no percussible tenderness.  Does have some pain with forward flexion along the lower lumbar spine and sacrum  Specialty Comments:  No specialty comments available.  Imaging: No results found.   PMFS History: Patient Active Problem List   Diagnosis Date Noted  . Other bursitis of knee, left knee 12/06/2018  . Pain in right leg 12/06/2018  . Parkinson disease [facility])    Past Medical History:  Diagnosis Date  . BPH (benign prostatic  hyperplasia)   . Parkinson disease ([place])   . Skin cancer of face    arms and back  . Sleep apnea    mild  . Spider bite    brown recluse/ left ankle/08/13    Family History  Problem Relation Age of Onset  . COPD Mother   . Cancer Father        Bladder  . Colon cancer Neg Hx   . Esophageal cancer Neg Hx   . Stomach cancer Neg Hx     Past Surgical History:  Procedure Laterality Date  . BURR HOLE W/ STEREOTACTIC INSERTION OF DBS LEADS / INTRAOP MICROELECTRODE RECORDING  04/2014  . COLONOSCOPY    . KNEE SURGERY     exploratory/left  . SKIN CANCER EXCISION  01/2014   face  . TRANSURETHRAL RESECTION OF PROSTATE     2008   Social History   Occupational History  . Occupation: LAWYER    Employer: [employer]  Tobacco Use  . Smoking status: Never Smoker  . Smokeless tobacco: Never Used  Substance and Sexual Activity  . Alcohol use:  Yes    Alcohol/week: 5.0 standard drinks    Types: 3 Glasses of wine, 2 Cans of beer per week    Comment: occasional  . Drug use: No  . Sexual activity: Not on file

## 2018-12-26 ENCOUNTER — Other Ambulatory Visit: Payer: Self-pay

## 2018-12-26 ENCOUNTER — Ambulatory Visit: Payer: Medicare Other

## 2018-12-26 DIAGNOSIS — R293 Abnormal posture: Secondary | ICD-10-CM | POA: Diagnosis not present

## 2018-12-26 DIAGNOSIS — M79651 Pain in right thigh: Secondary | ICD-10-CM

## 2018-12-26 DIAGNOSIS — R29818 Other symptoms and signs involving the nervous system: Secondary | ICD-10-CM

## 2018-12-26 DIAGNOSIS — R2689 Other abnormalities of gait and mobility: Secondary | ICD-10-CM

## 2018-12-26 NOTE — Therapy (Signed)
[place], [name], [address] Phone: [phone]   Fax:  [phone]  Physical Therapy Treatment  Patient Details  Name: Ronald Noble MRN: [id number] Date of Birth: June 27, 1945 Referring Provider (PT): Joni Fears, MD   Encounter Date: 12/26/2018  PT End of Session - 12/26/18 0747    Visit Number  3    Number of Visits  8    Date for PT Re-Evaluation  01/13/19    Authorization Type  Medicare & BCBS - 10th visit PN    PT Start Time  0746    PT Stop Time  0830    PT Time Calculation (min)  44 min    Activity Tolerance  Patient tolerated treatment well    Behavior During Therapy  [facility] for tasks assessed/performed       Past Medical History:  Diagnosis Date  . BPH (benign prostatic hyperplasia)   . Parkinson disease ([place])   . Skin cancer of face    arms and back  . Sleep apnea    mild  . Spider bite    brown recluse/ left ankle/08/13    Past Surgical History:  Procedure Laterality Date  . BURR HOLE W/ STEREOTACTIC INSERTION OF DBS LEADS / INTRAOP MICROELECTRODE RECORDING  04/2014  . COLONOSCOPY    . KNEE SURGERY     exploratory/left  . SKIN CANCER EXCISION  01/2014   face  . TRANSURETHRAL RESECTION OF PROSTATE     2008    There were no vitals filed for this visit.  Subjective Assessment - 12/26/18 0751    Subjective  No pain to start Dr Durward Fortes will have MRI done to check spine.    Currently in Pain?  No/denies                       [facility] Adult PT Treatment/Exercise - 12/26/18 0001      Knee/Hip Exercises: Stretches   Other Knee/Hip Stretches  LTR x 2 30 sec RT/LT    then RT piriformis stretch 30 sec x 2  RT/LT      Knee/Hip Exercises: Aerobic   Nustep  L5  5 min no pain LE only       Knee/Hip Exercises: Seated   Long Arc Quad  Right;Left;15 reps      Knee/Hip Exercises: Supine   Other Supine Knee/Hip Exercises  PPT x 20     Other Supine Knee/Hip  Exercises  green band clam x 20, cued for breathing/exhale on pull       Manual Therapy   Manual Traction  LT leg pulls              PT Education - 12/26/18 0832    Education Details  HEP    Person(s) Educated  Patient    Methods  Explanation;Demonstration;Tactile cues;Verbal cues;Handout    Comprehension  Returned demonstration;Verbalized understanding       PT Short Term Goals - 11/25/18 0805      PT SHORT TERM GOAL #1   Title  Patient will be independent with initial HEP for balance and strengthening in order to build upon functional gains in therapy. ALL STG TARGET DATE 11/19/18    Baseline  Pt demonstrates HEP, but does not report doing consistently    Time  4    Period  Weeks    Status  Partially Met    Target Date  11/19/18      PT  SHORT TERM GOAL #2   Title  Patient will verbalize fall prevention strategies in the home in order to decrease risk of future falls.    Time  4    Period  Weeks    Status  On-going      PT SHORT TERM GOAL #3   Title  Patient will improve miniBEST score to at least a 22/28 in order to demonstrate decreased fall risk.    Baseline  20/28 on 10/20/18    Time  4    Period  Weeks    Status  Achieved      PT SHORT TERM GOAL #4   Title  Patient will perform 5 x sit to stand from 18" chair with no UE support demonstrating no posterior LOB.    Baseline  episode of posterior LOB and decreased eccentric control when lowering    Time  4    Period  Weeks    Status  Achieved        PT Long Term Goals - 12/19/18 4503      Additional Long Term Goals   Additional Long Term Goals  Yes      PT LONG TERM GOAL #7   Title  He will be independent with all exercises related to RT hamstring pain    Time  4    Period  Weeks    Status  New      PT LONG TERM GOAL #8   Title  He will report able to sit in eveining with 1-2 max pain in RT hamstring.    Time  4    Period  Weeks    Status  New      PT LONG TERM GOAL  #9   TITLE  He will be able  to bend over with no hamstring pain    Time  4    Period  Weeks    Status  New            Plan - 12/26/18 8882    Clinical Impression Statement  No pain post , needs quad and hip ROM  .  Continue stretching and cor estrength    PT Treatment/Interventions  Therapeutic activities;Therapeutic exercise;Patient/family education;Manual techniques;Passive range of motion;Dry needling    PT Next Visit Plan  implanted medica; device NO ELECTRIC STIM ,      Review HEP from neuro. manual for stretching and   MET as needed, hip strength check hip MMT    PT Home Exercise Plan  Neuro HEP includes: weight shifting prone, seated , standing all with reaching and rotations, strap hamsting , ITB stretch supine, LAQ, PPT , piriformis stretch       Patient will benefit from skilled therapeutic intervention in order to improve the following deficits and impairments:  Abnormal gait, Decreased balance, Decreased coordination, Decreased range of motion, Decreased mobility, Decreased strength, Difficulty walking, Postural dysfunction, Decreased activity tolerance, Pain  Visit Diagnosis: Pain in right thigh  Other abnormalities of gait and mobility  Abnormal posture  Other symptoms and signs involving the nervous system     Problem List Patient Active Problem List   Diagnosis Date Noted  . Other bursitis of knee, left knee 12/06/2018  . Pain in right leg 12/06/2018  . Parkinson disease [facility])     Darrel Hoover PT 12/26/2018, 8:34 AM  [facility] [address] [place], [name], [address] Phone: [phone]   Fax:  [phone]  Name: Ronald  Ancelmo Noble MRN: [id number] Date of Birth: 07-06-1945

## 2018-12-26 NOTE — Patient Instructions (Signed)
LAQ 2x10 5 sec hold  RT/Lt ,, PPT x 10 5 sec hjold ,  Piriformis stretch x 3 30 secd RT/LT

## 2018-12-29 ENCOUNTER — Ambulatory Visit: Payer: Medicare Other | Admitting: Physical Therapy

## 2018-12-29 ENCOUNTER — Other Ambulatory Visit: Payer: Self-pay

## 2018-12-29 DIAGNOSIS — R2689 Other abnormalities of gait and mobility: Secondary | ICD-10-CM

## 2018-12-29 DIAGNOSIS — R29818 Other symptoms and signs involving the nervous system: Secondary | ICD-10-CM | POA: Diagnosis not present

## 2018-12-29 DIAGNOSIS — M79651 Pain in right thigh: Secondary | ICD-10-CM | POA: Diagnosis not present

## 2018-12-29 DIAGNOSIS — R293 Abnormal posture: Secondary | ICD-10-CM | POA: Diagnosis not present

## 2018-12-29 NOTE — Therapy (Signed)
[place], [name], [address] Phone: [phone]   Fax:  [phone]  Physical Therapy Treatment  Patient Details  Name: Ronald Noble MRN: [id number] Date of Birth: [birth year] Referring Provider (PT): Joni Fears, MD   Encounter Date: 12/29/2018  PT End of Session - 12/29/18 0810    Visit Number  4    Number of Visits  8    Date for PT Re-Evaluation  01/13/19    Authorization Type  Medicare & BCBS - 10th visit PN    PT Start Time  0803    PT Stop Time  0843    PT Time Calculation (min)  40 min    Activity Tolerance  Patient tolerated treatment well    Behavior During Therapy  [facility] for tasks assessed/performed       Past Medical History:  Diagnosis Date  . BPH (benign prostatic hyperplasia)   . Parkinson disease ([place])   . Skin cancer of face    arms and back  . Sleep apnea    mild  . Spider bite    brown recluse/ left ankle/08/13    Past Surgical History:  Procedure Laterality Date  . BURR HOLE W/ STEREOTACTIC INSERTION OF DBS LEADS / INTRAOP MICROELECTRODE RECORDING  04/2014  . COLONOSCOPY    . KNEE SURGERY     exploratory/left  . SKIN CANCER EXCISION  01/2014   face  . TRANSURETHRAL RESECTION OF PROSTATE     2008    There were no vitals filed for this visit.  Subjective Assessment - 12/29/18 0806    Subjective  My Right hip is hurting today. It feels good to stretch. When I put pressure on my hip, it hurts on the side. Makes it hard to keep a good stride. Pt fell into wall on the right side after standing from chair and turning the corner.    Currently in Pain?  Yes    Pain Score  5     Pain Location  Hip    Pain Orientation  Right;Upper;Mid;Lower;Lateral    Pain Descriptors / Indicators  Sore    Aggravating Factors   sitting still, trying to sleep    Pain Relieving Factors  get up and move, stretches                       [facility] Adult PT  Treatment/Exercise - 12/29/18 0001      Knee/Hip Exercises: Stretches   Passive Hamstring Stretch  Both;2 reps;30 seconds    Hip Flexor Stretch Limitations  seated edge of mat    Piriformis Stretch Limitations  seated edge of bed    Other Knee/Hip Stretches  Lt elbow on knee, Rt arm reach OH      Knee/Hip Exercises: Aerobic   Nustep  L3 UE & LE 5 min      Knee/Hip Exercises: Standing   Gait Training  cues for heel strike and arm swing      Knee/Hip Exercises: Seated   Sit to [employer]  10 reps   cues for glut set and reach [facility]     Knee/Hip Exercises: Supine   Bridges  15 reps    Bridges Limitations  cues for midline, feet DF      Knee/Hip Exercises: Prone   Other Prone Exercises  opp arm/leg raise 3s holds               PT Short Term Goals -  11/25/18 0805      PT SHORT TERM GOAL #1   Title  Patient will be independent with initial HEP for balance and strengthening in order to build upon functional gains in therapy. ALL STG TARGET DATE 11/19/18    Baseline  Pt demonstrates HEP, but does not report doing consistently    Time  4    Period  Weeks    Status  Partially Met    Target Date  11/19/18      PT SHORT TERM GOAL #2   Title  Patient will verbalize fall prevention strategies in the home in order to decrease risk of future falls.    Time  4    Period  Weeks    Status  On-going      PT SHORT TERM GOAL #3   Title  Patient will improve miniBEST score to at least a 22/28 in order to demonstrate decreased fall risk.    Baseline  20/28 on 10/20/18    Time  4    Period  Weeks    Status  Achieved      PT SHORT TERM GOAL #4   Title  Patient will perform 5 x sit to stand from 18" chair with no UE support demonstrating no posterior LOB.    Baseline  episode of posterior LOB and decreased eccentric control when lowering    Time  4    Period  Weeks    Status  Achieved        PT Long Term Goals - 12/19/18 3149      Additional Long Term Goals   Additional Long Term  Goals  Yes      PT LONG TERM GOAL #7   Title  He will be independent with all exercises related to RT hamstring pain    Time  4    Period  Weeks    Status  New      PT LONG TERM GOAL #8   Title  He will report able to sit in eveining with 1-2 max pain in RT hamstring.    Time  4    Period  Weeks    Status  New      PT LONG TERM GOAL  #9   TITLE  He will be able to bend over with no hamstring pain    Time  4    Period  Weeks    Status  New            Plan - 12/29/18 0849    Clinical Impression Statement  cues provided for big motions as pt tends toward smaller motions consistent with Parkinsons. reported pain decreasing to just a twinge after stretches and exercises. Able to correct to midline posture with both verbal and tactile cues. Provided stretches in seated that he can do throughout the day.    PT Treatment/Interventions  Therapeutic activities;Therapeutic exercise;Patient/family education;Manual techniques;Passive range of motion;Dry needling    PT Home Exercise Plan  Neuro HEP includes: weight shifting prone, seated , standing all with reaching and rotations, strap hamsting-supine & seated , ITB stretch supine, LAQ, PPT , piriformis stretch-supine & seated, hip flexor stretch supine & seated, prone alt UE/LE ext, bridge, sit>stand with OH reach    Consulted and Agree with Plan of Care  Patient       Patient will benefit from skilled therapeutic intervention in order to improve the following deficits and impairments:  Abnormal gait, Decreased balance, Decreased coordination, Decreased range  of motion, Decreased mobility, Decreased strength, Difficulty walking, Postural dysfunction, Decreased activity tolerance, Pain  Visit Diagnosis: Pain in right thigh  Other abnormalities of gait and mobility  Abnormal posture     Problem List Patient Active Problem List   Diagnosis Date Noted  . Other bursitis of knee, left knee 12/06/2018  . Pain in right leg 12/06/2018   . Parkinson disease ([place])   Darryn Kydd C. Rudi Knippenberg PT, DPT 12/29/18 8:53 AM   [facility] [address] [place], [name], [address] Phone: [phone]   Fax:  [phone]  Name: Diaz Crago MRN: [id number] Date of Birth: Dec 22, 1945

## 2018-12-30 ENCOUNTER — Telehealth: Payer: Self-pay | Admitting: Orthopaedic Surgery

## 2018-12-30 NOTE — Telephone Encounter (Signed)
Can you please check on this? Thank you.

## 2018-12-30 NOTE — Telephone Encounter (Signed)
Patient left a voicemail checking the status of his referral for a CT scan.  Patient requested a return call.

## 2019-01-02 ENCOUNTER — Other Ambulatory Visit: Payer: Self-pay

## 2019-01-02 ENCOUNTER — Ambulatory Visit: Payer: Medicare Other

## 2019-01-02 DIAGNOSIS — R29818 Other symptoms and signs involving the nervous system: Secondary | ICD-10-CM | POA: Diagnosis not present

## 2019-01-02 DIAGNOSIS — R2689 Other abnormalities of gait and mobility: Secondary | ICD-10-CM

## 2019-01-02 DIAGNOSIS — M79651 Pain in right thigh: Secondary | ICD-10-CM

## 2019-01-02 DIAGNOSIS — R293 Abnormal posture: Secondary | ICD-10-CM | POA: Diagnosis not present

## 2019-01-02 NOTE — Telephone Encounter (Signed)
I contacted pt and advised him [facility] will be giving him a call to schedule appt, pt states he will call them this am to get scheduled. Number was given to pt.

## 2019-01-02 NOTE — Therapy (Signed)
[place], [name], [address] Phone: [phone]   Fax:  [phone]  Physical Therapy Treatment  Patient Details  Name: Ronald Noble MRN: [id number] Date of Birth: Apr 27, 1945 Referring Provider (PT): Joni Fears, MD   Encounter Date: 01/02/2019  PT End of Session - 01/02/19 0705    Visit Number  5    Number of Visits  8    Date for PT Re-Evaluation  01/13/19    Authorization Type  Medicare & BCBS - 10th visit PN    PT Start Time  0705    PT Stop Time  0745    PT Time Calculation (min)  40 min    Activity Tolerance  Patient tolerated treatment well    Behavior During Therapy  [facility] for tasks assessed/performed       Past Medical History:  Diagnosis Date  . BPH (benign prostatic hyperplasia)   . Parkinson disease ([place])   . Skin cancer of face    arms and back  . Sleep apnea    mild  . Spider bite    brown recluse/ left ankle/08/13    Past Surgical History:  Procedure Laterality Date  . BURR HOLE W/ STEREOTACTIC INSERTION OF DBS LEADS / INTRAOP MICROELECTRODE RECORDING  04/2014  . COLONOSCOPY    . KNEE SURGERY     exploratory/left  . SKIN CANCER EXCISION  01/2014   face  . TRANSURETHRAL RESECTION OF PROSTATE     2008    There were no vitals filed for this visit.  Subjective Assessment - 01/02/19 0708    Subjective  RT hip sore with walking today.  Still has occasional medial hamstring and calf pain with sitting > 30 min    Currently in Pain?  No/denies   sitting for nustep   Pain Score  3     Pain Location  Hip    Pain Orientation  Right    Pain Descriptors / Indicators  Sore    Pain Type  Chronic pain    Pain Onset  More than a month ago    Pain Frequency  Intermittent    Aggravating Factors   sitting too long , walking    Pain Relieving Factors  stretching                       [facility] Adult PT Treatment/Exercise - 01/02/19 0001      Knee/Hip  Exercises: Stretches   Passive Hamstring Stretch  Right;1 rep;30 seconds    Hip Flexor Stretch  60 seconds    Hip Flexor Stretch Limitations  seated edge of mat    Knee: Self-Stretch to increase Flexion  1 rep    Piriformis Stretch  Right;Left;30 seconds    Piriformis Stretch Limitations  seated edge of bed    Other Knee/Hip Stretches  hip adductors 60 sec bilateral      Knee/Hip Exercises: Aerobic   Nustep  L 5 LE 5 min      Knee/Hip Exercises: Seated   Sit to [employer]  10 reps      Knee/Hip Exercises: Supine   Other Supine Knee/Hip Exercises  PPT x 20     Other Supine Knee/Hip Exercises  PPT with slight pelvic lift and part sit ups       Manual Therapy   Manual Therapy  Joint mobilization    Joint Mobilization  Anterior and psoterior glides RT and LT hip  Passive ROM  quads bilaterally ,  hip ext with adduction ,        Manual Traction  Lt and RT gr 4                PT Short Term Goals - 11/25/18 0805      PT SHORT TERM GOAL #1   Title  Patient will be independent with initial HEP for balance and strengthening in order to build upon functional gains in therapy. ALL STG TARGET DATE 11/19/18    Baseline  Pt demonstrates HEP, but does not report doing consistently    Time  4    Period  Weeks    Status  Partially Met    Target Date  11/19/18      PT SHORT TERM GOAL #2   Title  Patient will verbalize fall prevention strategies in the home in order to decrease risk of future falls.    Time  4    Period  Weeks    Status  On-going      PT SHORT TERM GOAL #3   Title  Patient will improve miniBEST score to at least a 22/28 in order to demonstrate decreased fall risk.    Baseline  20/28 on 10/20/18    Time  4    Period  Weeks    Status  Achieved      PT SHORT TERM GOAL #4   Title  Patient will perform 5 x sit to stand from 18" chair with no UE support demonstrating no posterior LOB.    Baseline  episode of posterior LOB and decreased eccentric control when lowering     Time  4    Period  Weeks    Status  Achieved        PT Long Term Goals - 12/19/18 6546      Additional Long Term Goals   Additional Long Term Goals  Yes      PT LONG TERM GOAL #7   Title  He will be independent with all exercises related to RT hamstring pain    Time  4    Period  Weeks    Status  New      PT LONG TERM GOAL #8   Title  He will report able to sit in eveining with 1-2 max pain in RT hamstring.    Time  4    Period  Weeks    Status  New      PT LONG TERM GOAL  #9   TITLE  He will be able to bend over with no hamstring pain    Time  4    Period  Weeks    Status  New            Plan - 01/02/19 0710    Clinical Impression Statement  hip pain may be form OA but tightness may contribute. Lt hip tighter  but weight to RT more than LT    No pain with palpation of RT greater trochantor and gluteals.    Hamstrings appear not to be less painful now just out side of hip.when here but still some with sitting at home    PT Treatment/Interventions  Therapeutic activities;Therapeutic exercise;Patient/family education;Manual techniques;Passive range of motion;Dry needling    PT Next Visit Plan  implanted medica; device NO ELECTRIC STIM ,      Review HEP from neuro. manual for stretching and   MET as needed, hip strength check hip MMT  PT Home Exercise Plan  stretching oof hamstrings and hip    Consulted and Agree with Plan of Care  Patient       Patient will benefit from skilled therapeutic intervention in order to improve the following deficits and impairments:  Abnormal gait, Decreased balance, Decreased coordination, Decreased range of motion, Decreased mobility, Decreased strength, Difficulty walking, Postural dysfunction, Decreased activity tolerance, Pain  Visit Diagnosis: Pain in right thigh  Other abnormalities of gait and mobility  Abnormal posture     Problem List Patient Active Problem List   Diagnosis Date Noted  . Other bursitis of knee, left  knee 12/06/2018  . Pain in right leg 12/06/2018  . Parkinson disease [facility])     Darrel Hoover  PT 01/02/2019, 7:52 AM  [facility] [address] [place], [name], [address] Phone: [phone]   Fax:  [phone]  Name: Ronald Noble MRN: [id number] Date of Birth: 07-25-1945

## 2019-01-04 ENCOUNTER — Other Ambulatory Visit: Payer: Medicare Other

## 2019-01-04 DIAGNOSIS — G245 Blepharospasm: Secondary | ICD-10-CM | POA: Diagnosis not present

## 2019-01-04 DIAGNOSIS — Z4542 Encounter for adjustment and management of neuropacemaker (brain) (peripheral nerve) (spinal cord): Secondary | ICD-10-CM | POA: Diagnosis not present

## 2019-01-04 DIAGNOSIS — Z9689 Presence of other specified functional implants: Secondary | ICD-10-CM | POA: Diagnosis not present

## 2019-01-04 DIAGNOSIS — G20 Parkinson's disease: Secondary | ICD-10-CM | POA: Diagnosis not present

## 2019-01-04 DIAGNOSIS — R296 Repeated falls: Secondary | ICD-10-CM | POA: Diagnosis not present

## 2019-01-05 ENCOUNTER — Other Ambulatory Visit: Payer: Self-pay

## 2019-01-05 ENCOUNTER — Ambulatory Visit: Payer: Medicare Other | Attending: Orthopaedic Surgery

## 2019-01-05 DIAGNOSIS — R293 Abnormal posture: Secondary | ICD-10-CM | POA: Insufficient documentation

## 2019-01-05 DIAGNOSIS — M79651 Pain in right thigh: Secondary | ICD-10-CM | POA: Insufficient documentation

## 2019-01-05 DIAGNOSIS — R2689 Other abnormalities of gait and mobility: Secondary | ICD-10-CM | POA: Insufficient documentation

## 2019-01-05 DIAGNOSIS — R2681 Unsteadiness on feet: Secondary | ICD-10-CM | POA: Diagnosis not present

## 2019-01-05 DIAGNOSIS — R29818 Other symptoms and signs involving the nervous system: Secondary | ICD-10-CM | POA: Insufficient documentation

## 2019-01-05 NOTE — Therapy (Signed)
[place], [name], [address] Phone: [phone]   Fax:  [phone]  Physical Therapy Treatment  Patient Details  Name: Ronald Noble MRN: [id number] Date of Birth: 19-Nov-1945 Referring Provider (PT): Joni Fears, MD   Encounter Date: 01/05/2019  PT End of Session - 01/05/19 0710    Visit Number  6    Number of Visits  8    Authorization Type  Medicare & BCBS - 10th visit PN    PT Start Time  0705    PT Stop Time  0745    PT Time Calculation (min)  40 min    Activity Tolerance  Patient tolerated treatment well    Behavior During Therapy  [facility] for tasks assessed/performed       Past Medical History:  Diagnosis Date  . BPH (benign prostatic hyperplasia)   . Parkinson disease ([place])   . Skin cancer of face    arms and back  . Sleep apnea    mild  . Spider bite    brown recluse/ left ankle/08/13    Past Surgical History:  Procedure Laterality Date  . BURR HOLE W/ STEREOTACTIC INSERTION OF DBS LEADS / INTRAOP MICROELECTRODE RECORDING  04/2014  . COLONOSCOPY    . KNEE SURGERY     exploratory/left  . SKIN CANCER EXCISION  01/2014   face  . TRANSURETHRAL RESECTION OF PROSTATE     2008    There were no vitals filed for this visit.  Subjective Assessment - 01/05/19 0709    Subjective  Hamstring sore today. RT  CT scan next week    Pain Score  3     Pain Location  --   RT lateral hamstring   Pain Orientation  Right;Posterior;Lateral    Pain Descriptors / Indicators  Sore    Pain Type  Chronic pain    Pain Onset  More than a month ago    Pain Frequency  Intermittent    Aggravating Factors   sitting and walking    Pain Relieving Factors  stretching                       [facility] Adult PT Treatment/Exercise - 01/05/19 0001      Knee/Hip Exercises: Stretches   Passive Hamstring Stretch  Right;30 seconds;2 reps    Hip Flexor Stretch  60 seconds    Piriformis Stretch   Right;Left;30 seconds    Other Knee/Hip Stretches  hip adductors 60 sec bilateral      Knee/Hip Exercises: Aerobic   Nustep  L 5 LE 5 min      Knee/Hip Exercises: Supine   Other Supine Knee/Hip Exercises  worked on PPT with Lt oblique facilitaiton with breathing then worked on lateal shift.  hips to RT.       Manual Therapy   Manual Therapy  Taping    Manual Traction  LT leg pull    Kinesiotex  Inhibit Muscle      Kinesiotix   Inhibit Muscle   hamstring Y             PT Education - 01/05/19 0746    Education Details  HEP    Person(s) Educated  Patient    Methods  Explanation    Comprehension  Verbalized understanding       PT Short Term Goals - 11/25/18 0805      PT SHORT TERM GOAL #1   Title  Patient will be independent with initial HEP for balance and strengthening in order to build upon functional gains in therapy. ALL STG TARGET DATE 11/19/18    Baseline  Pt demonstrates HEP, but does not report doing consistently    Time  4    Period  Weeks    Status  Partially Met    Target Date  11/19/18      PT SHORT TERM GOAL #2   Title  Patient will verbalize fall prevention strategies in the home in order to decrease risk of future falls.    Time  4    Period  Weeks    Status  On-going      PT SHORT TERM GOAL #3   Title  Patient will improve miniBEST score to at least a 22/28 in order to demonstrate decreased fall risk.    Baseline  20/28 on 10/20/18    Time  4    Period  Weeks    Status  Achieved      PT SHORT TERM GOAL #4   Title  Patient will perform 5 x sit to stand from 18" chair with no UE support demonstrating no posterior LOB.    Baseline  episode of posterior LOB and decreased eccentric control when lowering    Time  4    Period  Weeks    Status  Achieved        PT Long Term Goals - 12/19/18 6834      Additional Long Term Goals   Additional Long Term Goals  Yes      PT LONG TERM GOAL #7   Title  He will be independent with all exercises  related to RT hamstring pain    Time  4    Period  Weeks    Status  New      PT LONG TERM GOAL #8   Title  He will report able to sit in eveining with 1-2 max pain in RT hamstring.    Time  4    Period  Weeks    Status  New      PT LONG TERM GOAL  #9   TITLE  He will be able to bend over with no hamstring pain    Time  4    Period  Weeks    Status  New            Plan - 01/05/19 0711    Clinical Impression Statement  Does not appear he is really improed as today he returns with hamstring pain. Will try kineseotape and continue strethcing   core strength.   Trial ionto and [place]    PT Treatment/Interventions  Therapeutic activities;Therapeutic exercise;Patient/family education;Manual techniques;Passive range of motion;Dry needling    PT Next Visit Plan  implanted medica; device NO ELECTRIC STIM ,   . manual for stretching and   MET as needed, hip strength check hip MMT    PT Home Exercise Plan  stretching oof hamstrings and hip  , lateral shift correction    Consulted and Agree with Plan of Care  Patient       Patient will benefit from skilled therapeutic intervention in order to improve the following deficits and impairments:  Abnormal gait, Decreased balance, Decreased coordination, Decreased range of motion, Decreased mobility, Decreased strength, Difficulty walking, Postural dysfunction, Decreased activity tolerance, Pain  Visit Diagnosis: Other abnormalities of gait and mobility  Pain in right thigh  Abnormal posture  Other symptoms and signs  involving the nervous system     Problem List Patient Active Problem List   Diagnosis Date Noted  . Other bursitis of knee, left knee 12/06/2018  . Pain in right leg 12/06/2018  . Parkinson disease [facility])     Ronald Noble  PT 01/05/2019, 9:49 AM  [facility] [address] [place], [name], [address] Phone: [phone]   Fax:  [phone]  Name: Ronald Noble MRN: [id number] Date of Birth: 01-05-1946

## 2019-01-05 NOTE — Patient Instructions (Signed)
Lateral shift correction in standing x 10 reps 2x/day hips to RT.   PPT with head and shoulder raises and rotation part sit up x 10 with exhaling on raising head  x5-10 1-2 x/day

## 2019-01-09 ENCOUNTER — Other Ambulatory Visit: Payer: Self-pay

## 2019-01-09 ENCOUNTER — Ambulatory Visit
Admission: RE | Admit: 2019-01-09 | Discharge: 2019-01-09 | Disposition: A | Discharge status: Home / Self Care | Payer: Medicare Other | Source: Ambulatory Visit | Attending: Orthopaedic Surgery | Admitting: Orthopaedic Surgery

## 2019-01-09 ENCOUNTER — Ambulatory Visit: Payer: Medicare Other

## 2019-01-09 DIAGNOSIS — G8929 Other chronic pain: Secondary | ICD-10-CM

## 2019-01-09 DIAGNOSIS — R29818 Other symptoms and signs involving the nervous system: Secondary | ICD-10-CM

## 2019-01-09 DIAGNOSIS — M79651 Pain in right thigh: Secondary | ICD-10-CM | POA: Diagnosis not present

## 2019-01-09 DIAGNOSIS — I700 Atherosclerosis of aorta: Secondary | ICD-10-CM | POA: Diagnosis not present

## 2019-01-09 DIAGNOSIS — R2689 Other abnormalities of gait and mobility: Secondary | ICD-10-CM

## 2019-01-09 DIAGNOSIS — M79604 Pain in right leg: Secondary | ICD-10-CM

## 2019-01-09 DIAGNOSIS — R2681 Unsteadiness on feet: Secondary | ICD-10-CM | POA: Diagnosis not present

## 2019-01-09 DIAGNOSIS — R293 Abnormal posture: Secondary | ICD-10-CM

## 2019-01-09 DIAGNOSIS — M4316 Spondylolisthesis, lumbar region: Secondary | ICD-10-CM | POA: Diagnosis not present

## 2019-01-09 DIAGNOSIS — M4186 Other forms of scoliosis, lumbar region: Secondary | ICD-10-CM | POA: Diagnosis not present

## 2019-01-09 DIAGNOSIS — M4726 Other spondylosis with radiculopathy, lumbar region: Secondary | ICD-10-CM | POA: Diagnosis not present

## 2019-01-09 NOTE — Patient Instructions (Signed)
Hip adductor stretch 60 sec 1-2 reps 2x/day if able,   LT hip abduction in side lye and RT hip adduction in sidelye  10-20 reps 1-2x/day hold 1-3 sec

## 2019-01-09 NOTE — Therapy (Signed)
[facility] [address] [place], , [address] Phone: [phone]   Fax:  [phone]  Physical Therapy Treatment  Patient Details  Name: Ronald Noble MRN: [id number] Date of Birth: 12/22/1945 Referring Provider (PT): Peter Whitfield, MD   Encounter Date: 01/09/2019  PT End of Session - 01/09/19 0711    Visit Number  7    Number of Visits  8    Date for PT Re-Evaluation  01/13/19    Authorization Type  Medicare & BCBS - 10th visit PN    PT Start Time  0703    PT Stop Time  0745    PT Time Calculation (min)  42 min    Activity Tolerance  Patient tolerated treatment well    Behavior During Therapy  WFL for tasks assessed/performed       Past Medical History:  Diagnosis Date  . BPH (benign prostatic hyperplasia)   . Parkinson disease (HCC)   . Skin cancer of face    arms and back  . Sleep apnea    mild  . Spider bite    brown recluse/ left ankle/08/13    Past Surgical History:  Procedure Laterality Date  . BURR HOLE W/ STEREOTACTIC INSERTION OF DBS LEADS / INTRAOP MICROELECTRODE RECORDING  04/2014  . COLONOSCOPY    . KNEE SURGERY     exploratory/left  . SKIN CANCER EXCISION  01/2014   face  . TRANSURETHRAL RESECTION OF PROSTATE     2008    There were no vitals filed for this visit.  Subjective Assessment - 01/09/19 0711    Subjective  no pain to start. tight    Currently in Pain?  No/denies                       [facility] Adult PT Treatment/Exercise - 01/09/19 0001      Knee/Hip Exercises: Stretches   Passive Hamstring Stretch  Right;30 seconds;2 reps    Piriformis Stretch  Right;Left;30 seconds    Other Knee/Hip Stretches  hip adductors 60 sec bilateral      Knee/Hip Exercises: Supine   Other Supine Knee/Hip Exercises  worked on PPT with Lt oblique facilitaiton adductor  and ankle ball squeeze with breathing then worked on lateal shift.  hips to RT.     Other Supine Knee/Hip  Exercises  Active SLR RT with head ans shoulder lifts.       Knee/Hip Exercises: Sidelying   Hip ABduction  Left;10 reps    Hip ADduction  Right;10 reps       Nustep L5 5 min UE/LE  REviewed lateral shift stretch    PT Education - 01/09/19 0744    Education Details  HEP    Person(s) Educated  Patient    Methods  Explanation;Demonstration;Tactile cues;Verbal cues;Handout    Comprehension  Returned demonstration;Verbalized understanding       PT Short Term Goals - 01/09/19 0713      PT SHORT TERM GOAL #1   Title  Patient will be independent with initial HEP for balance and strengthening in order to build upon functional gains in therapy. ALL STG TARGET DATE 11/19/18        PT Long Term Goals - 01/09/19 0714      PT LONG TERM GOAL #7   Title  He will be independent with all exercises related to RT hamstring pain    Baseline  may advance    Status  Achieved        PT LONG TERM GOAL #8   Baseline  8/10 then stands to stetch out    Status  On-going      PT LONG TERM GOAL  #9   TITLE  He will be able to bend over with no hamstring pain    Baseline  tad better if stretched out, not as bad as it used to be    Status  On-going            Plan - 01/09/19 0712    Clinical Impression Statement  No signoificant improvement. Will see what CT scan shows and talk about extension    PT Treatment/Interventions  Therapeutic activities;Therapeutic exercise;Patient/family education;Manual techniques;Passive range of motion;Dry needling    PT Next Visit Plan  implanted medica; device NO ELECTRIC STIM ,   . manual for stretching and   MET as needed, hip strength check hip MMT    PT Home Exercise Plan  stretching of hamstrings and hip  , lateral shift correction, SLR sid lye Lt abduct   RT adduct    Consulted and Agree with Plan of Care  Patient       Patient will benefit from skilled therapeutic intervention in order to improve the following deficits and impairments:  Abnormal  gait, Decreased balance, Decreased coordination, Decreased range of motion, Decreased mobility, Decreased strength, Difficulty walking, Postural dysfunction, Decreased activity tolerance, Pain  Visit Diagnosis: Other abnormalities of gait and mobility  Pain in right thigh  Abnormal posture  Other symptoms and signs involving the nervous system     Problem List Patient Active Problem List   Diagnosis Date Noted  . Other bursitis of knee, left knee 12/06/2018  . Pain in right leg 12/06/2018  . Parkinson disease [facility])     Darrel Hoover  PT 01/09/2019, 7:46 AM  [facility] [address] [place], [name], [address] Phone: [phone]   Fax:  [phone]  Name: Ronald Noble MRN: [id number] Date of Birth: April 28, 1945

## 2019-01-10 ENCOUNTER — Ambulatory Visit (INDEPENDENT_AMBULATORY_CARE_PROVIDER_SITE_OTHER): Payer: Medicare Other | Admitting: Orthopaedic Surgery

## 2019-01-10 ENCOUNTER — Encounter: Payer: Self-pay | Admitting: Orthopaedic Surgery

## 2019-01-10 VITALS — BP 109/59 | HR 72 | Ht 71.0 in | Wt 155.0 lb

## 2019-01-10 DIAGNOSIS — M79604 Pain in right leg: Secondary | ICD-10-CM

## 2019-01-10 NOTE — Progress Notes (Signed)
Office Visit Note   Patient: Ronald Noble           Date of Birth: 01-10-1946           MRN: [id number] Visit Date: 01/10/2019              Requested by: [name], MD [address] [place],  [place] [address] PCP: [name], MD   Assessment & Plan: Visit Diagnoses:  1. Pain in right leg     Plan: CT scan of the lumbar spine without contrast was performed demonstrating lumbar spondylosis, scoliosis and degenerative disc disease contributing to moderate impingement at L4-5 and mild impingement of L2-3, L3-4 and L5-S1.  There was 3 mm grade 1 degenerative anterolisthesis at L4-5.  Also mild to moderate right hydronephrosis but no change from prior exam in 2013.  The question is whether or not the findings on the CT scan are responsible for the posterior right leg pain or is it related to his Parkinson's.  Ronald Noble is going to physical therapy which seems to be helping.  Long discussion regarding the findings.  I think it is worth considering an epidural steroid injection for both diagnostic and therapeutic purposes.  They like to proceed she will call and set that up.  Return thereafter will continue to follow-up with the neurologist.  Office visit over 20 minutes 50% of the time in counseling  Follow-Up Instructions: Return if symptoms worsen or fail to improve.   Orders:  No orders of the defined types were placed in this encounter.  No orders of the defined types were placed in this encounter.     Procedures: No procedures performed   Clinical Data: No additional findings.   Subjective: Chief Complaint  Patient presents with  . Lower Back - Follow-up    CT results  Patient presents today for a two week follow up on his lower back. He had a CT scan of his lower back on 01/09/2019. Patient states that there has been no change in symptoms.. He said that his pain can be a level 7, but right now he is okay.  Presently involved in a physical therapy program  HPI   Review of Systems  Constitutional: Negative for fatigue.  HENT: Negative for ear pain.   Eyes: Negative for pain.  Respiratory: Negative for shortness of breath.   Cardiovascular: Negative for leg swelling.  Gastrointestinal: Negative for constipation and diarrhea.  Endocrine: Negative for cold intolerance and heat intolerance.  Genitourinary: Negative for difficulty urinating.  Musculoskeletal: Negative for joint swelling.  Skin: Negative for rash.  Allergic/Immunologic: Negative for food allergies.  Neurological: Positive for weakness.  Hematological: Does not bruise/bleed easily.  Psychiatric/Behavioral: Negative for sleep disturbance.     Objective: Vital Signs: BP (!) 109/59   Pulse 72   Ht 5\' 11"  (1.803 m)   Wt 155 lb (70.3 kg)   BMI 21.62 kg/m   Physical Exam Constitutional:      Appearance: He is well-developed.  Eyes:     Pupils: Pupils are equal, round, and reactive to light.  Pulmonary:     Effort: Pulmonary effort is normal.  Skin:    General: Skin is warm and dry.  Neurological:     Mental Status: He is alert and oriented to person, place, and time.  Psychiatric:        Behavior: Behavior normal.     Ortho Exam ambulates without aid but does have a shuffling gait related to his Parkinson's.  Straight leg raise was negative for back pain but did have some hamstring tightness.  No percussible tenderness of lumbar spine.  Painless range of motion of both hips  Specialty Comments:  No specialty comments available.  Imaging: No results found.   PMFS History: Patient Active Problem List   Diagnosis Date Noted  . Other bursitis of knee, left knee 12/06/2018  . Pain in right leg 12/06/2018  . Parkinson disease [facility])    Past Medical History:  Diagnosis Date  . BPH (benign prostatic hyperplasia)   . Parkinson disease ([place])   . Skin cancer of face    arms and back  . Sleep apnea    mild  . Spider bite    brown recluse/ left ankle/08/13    Family  History  Problem Relation Age of Onset  . COPD Mother   . Cancer Father        Bladder  . Colon cancer Neg Hx   . Esophageal cancer Neg Hx   . Stomach cancer Neg Hx     Past Surgical History:  Procedure Laterality Date  . BURR HOLE W/ STEREOTACTIC INSERTION OF DBS LEADS / INTRAOP MICROELECTRODE RECORDING  04/2014  . COLONOSCOPY    . KNEE SURGERY     exploratory/left  . SKIN CANCER EXCISION  01/2014   face  . TRANSURETHRAL RESECTION OF PROSTATE     2008   Social History   Occupational History  . Occupation: LAWYER    Employer: [employer]  Tobacco Use  . Smoking status: Never Smoker  . Smokeless tobacco: Never Used  Substance and Sexual Activity  . Alcohol use: Yes    Alcohol/week: 5.0 standard drinks    Types: 3 Glasses of wine, 2 Cans of beer per week    Comment: occasional  . Drug use: No  . Sexual activity: Not on file

## 2019-01-10 NOTE — Addendum Note (Signed)
Addended by: Lendon Collar on: 01/10/2019 01:31 PM   Modules accepted: Orders

## 2019-01-12 ENCOUNTER — Ambulatory Visit: Payer: Medicare Other

## 2019-01-12 ENCOUNTER — Other Ambulatory Visit: Payer: Self-pay

## 2019-01-12 DIAGNOSIS — R293 Abnormal posture: Secondary | ICD-10-CM | POA: Diagnosis not present

## 2019-01-12 DIAGNOSIS — M79651 Pain in right thigh: Secondary | ICD-10-CM

## 2019-01-12 DIAGNOSIS — R2681 Unsteadiness on feet: Secondary | ICD-10-CM | POA: Diagnosis not present

## 2019-01-12 DIAGNOSIS — R2689 Other abnormalities of gait and mobility: Secondary | ICD-10-CM

## 2019-01-12 DIAGNOSIS — R29818 Other symptoms and signs involving the nervous system: Secondary | ICD-10-CM | POA: Diagnosis not present

## 2019-01-12 NOTE — Therapy (Signed)
[place], [name], [address] Phone: [phone]   Fax:  [phone]  Physical Therapy Treatment  Patient Details  Name: Ronald Noble MRN: [id number] Date of Birth: 02/01/1946 Referring Provider (PT): Joni Fears, MD   Encounter Date: 01/12/2019  PT End of Session - 01/12/19 0735    Visit Number  8    Number of Visits  14    Date for PT Re-Evaluation  02/02/19    Authorization Type  Medicare & BCBS - 10th visit PN    PT Start Time  0701    PT Stop Time  0739    PT Time Calculation (min)  38 min    Activity Tolerance  Patient tolerated treatment well;No increased pain    Behavior During Therapy  WFL for tasks assessed/performed       Past Medical History:  Diagnosis Date  . BPH (benign prostatic hyperplasia)   . Parkinson disease ([place])   . Skin cancer of face    arms and back  . Sleep apnea    mild  . Spider bite    brown recluse/ left ankle/08/13    Past Surgical History:  Procedure Laterality Date  . BURR HOLE W/ STEREOTACTIC INSERTION OF DBS LEADS / INTRAOP MICROELECTRODE RECORDING  04/2014  . COLONOSCOPY    . KNEE SURGERY     exploratory/left  . SKIN CANCER EXCISION  01/2014   face  . TRANSURETHRAL RESECTION OF PROSTATE     2008    There were no vitals filed for this visit.  Subjective Assessment - 01/12/19 0708    Subjective  Md will give cortisone injection next week Did not find what he though might have been causing pain.    Pain Score  2     Pain Location  --   thigh RT   Pain Orientation  Right;Posterior;Lateral    Pain Descriptors / Indicators  Sore;Tightness    Pain Type  Chronic pain    Pain Onset  More than a month ago    Pain Frequency  Intermittent                       [facility] Adult PT Treatment/Exercise - 01/12/19 0001      Knee/Hip Exercises: Stretches   Passive Hamstring Stretch  Right;2 reps;60 seconds    Piriformis Stretch   Right;Left;30 seconds;2 reps    Other Knee/Hip Stretches  hip adductors 60 sec bilateral      Manual Therapy   Manual therapy comments  trigger pt release QL  at iliac crest.     Joint Mobilization  PA glides to RT hip prone    Passive ROM  quads bilaterally ,  RT hip ext with adduction ,    LT side bend with drop feet  and pull on  pelvis       Manual Traction  RT leg pull gr 4 100 reps                PT Short Term Goals - 01/09/19 7062      PT SHORT TERM GOAL #1   Title  Patient will be independent with initial HEP for balance and strengthening in order to build upon functional gains in therapy. ALL STG TARGET DATE 11/19/18        PT Long Term Goals - 01/09/19 0714      PT LONG TERM GOAL #7   Title  He will  be independent with all exercises related to RT hamstring pain    Baseline  may advance    Status  Achieved      PT LONG TERM GOAL #8   Baseline  8/10 then stands to stetch out    Status  On-going      PT LONG TERM GOAL  #9   TITLE  He will be able to bend over with no hamstring pain    Baseline  tad better if stretched out, not as bad as it used to be    Status  On-going            Plan - 01/12/19 0738    Clinical Impression Statement  I thingk 6 more session mostly with stretching and mobs for spine / RT hip and thigh flexiblity may be helpful . If not by end of month he will have an injection and see if that will help.  He has mor exercises than he could possibly do on a regular basis  and may ask him to stretch more and other exercises less    Examination-Activity Limitations  Locomotion Level;Sit;Carry;Squat;Stand    PT Treatment/Interventions  Therapeutic activities;Therapeutic exercise;Patient/family education;Manual techniques;Passive range of motion;Dry needling    PT Next Visit Plan  implanted medica; device NO ELECTRIC STIM ,   . EMphasis will be on manual for stretching/Mobs                         MET as needed, check hip MMT    PT Home Exercise  Plan  stretching of hamstrings and hip  , lateral shift correction, SLR sid lye Lt abduct   RT adduct    Consulted and Agree with Plan of Care  Patient       Patient will benefit from skilled therapeutic intervention in order to improve the following deficits and impairments:  Abnormal gait, Decreased balance, Decreased coordination, Decreased range of motion, Decreased mobility, Decreased strength, Difficulty walking, Postural dysfunction, Decreased activity tolerance, Pain  Visit Diagnosis: Other abnormalities of gait and mobility  Pain in right thigh  Abnormal posture     Problem List Patient Active Problem List   Diagnosis Date Noted  . Other bursitis of knee, left knee 12/06/2018  . Pain in right leg 12/06/2018  . Parkinson disease [facility])     Darrel Hoover  PT 01/12/2019, 7:41 AM  [facility] [address] [place], [name], [address] Phone: [phone]   Fax:  [phone]  Name: Suhas Estis MRN: [id number] Date of Birth: [birth year]

## 2019-01-16 ENCOUNTER — Other Ambulatory Visit: Payer: Self-pay

## 2019-01-16 ENCOUNTER — Ambulatory Visit: Payer: Medicare Other

## 2019-01-16 DIAGNOSIS — R2689 Other abnormalities of gait and mobility: Secondary | ICD-10-CM | POA: Diagnosis not present

## 2019-01-16 DIAGNOSIS — R293 Abnormal posture: Secondary | ICD-10-CM

## 2019-01-16 DIAGNOSIS — M79651 Pain in right thigh: Secondary | ICD-10-CM | POA: Diagnosis not present

## 2019-01-16 DIAGNOSIS — R29818 Other symptoms and signs involving the nervous system: Secondary | ICD-10-CM | POA: Diagnosis not present

## 2019-01-16 DIAGNOSIS — R2681 Unsteadiness on feet: Secondary | ICD-10-CM | POA: Diagnosis not present

## 2019-01-16 NOTE — Therapy (Signed)
[place], [name], [address] Phone: [phone]   Fax:  [phone]  Physical Therapy Treatment  Patient Details  Name: Ronald Noble MRN: [id number] Date of Birth: Aug 17, 1945 Referring Provider (PT): Joni Fears, MD   Encounter Date: 01/16/2019  PT End of Session - 01/16/19 0703    Visit Number  9    Number of Visits  14    Date for PT Re-Evaluation  02/02/19    Authorization Type  Medicare & BCBS - 10th visit PN    PT Start Time  0702    PT Stop Time  0742    PT Time Calculation (min)  40 min    Activity Tolerance  Patient tolerated treatment well;No increased pain    Behavior During Therapy  WFL for tasks assessed/performed       Past Medical History:  Diagnosis Date  . BPH (benign prostatic hyperplasia)   . Parkinson disease ([place])   . Skin cancer of face    arms and back  . Sleep apnea    mild  . Spider bite    brown recluse/ left ankle/08/13    Past Surgical History:  Procedure Laterality Date  . BURR HOLE W/ STEREOTACTIC INSERTION OF DBS LEADS / INTRAOP MICROELECTRODE RECORDING  04/2014  . COLONOSCOPY    . KNEE SURGERY     exploratory/left  . SKIN CANCER EXCISION  01/2014   face  . TRANSURETHRAL RESECTION OF PROSTATE     2008    There were no vitals filed for this visit.  Subjective Assessment - 01/16/19 0708    Subjective  RT hip pain this a\AM that  is gone after getting up and moving.   Still thigh and calf pain with  sitting.    Currently in Pain?  No/denies   hip pain earlier                      [facility] Adult PT Treatment/Exercise - 01/16/19 0001      Knee/Hip Exercises: Stretches   Other Knee/Hip Stretches  prone on elbows x 2 min      Knee/Hip Exercises: Supine   Other Supine Knee/Hip Exercises  RT hip IR x 15 and in LT side lye x 12      Manual Therapy   Passive ROM  bilateral hamstrings, hip adductors, Thomas stretch.   LTR RT/LT ,  Hip rotation IR /ER   , Hip extension RT/LT  prone and in OBER's position on Rt.     Muscle Energy Technique  RT knee to chest and press out into PT leg, contract relax with trunk LT sidebending and legs off bed with feet pull ups.                PT Short Term Goals - 01/09/19 5397      PT SHORT TERM GOAL #1   Title  Patient will be independent with initial HEP for balance and strengthening in order to build upon functional gains in therapy. ALL STG TARGET DATE 11/19/18        PT Long Term Goals - 01/16/19 0813      PT LONG TERM GOAL #7   Title  He will be independent with all exercises related to RT hamstring pain    Baseline  may advance    Status  On-going      PT LONG TERM GOAL #8   Title  He will report able  to sit in eveining with 1-2 max pain in RT hamstring.    Status  On-going      PT LONG TERM GOAL  #9   TITLE  He will be able to bend over with no hamstring pain    Baseline  tad better if stretched out, not as bad as it used to be    Status  On-going            Plan - 01/16/19 0706    Clinical Impression Statement  No changes.  Rt hip pain comes and goes  but eases off with walking.hanges as he continues with intermittant RT thigh and calf pain with sitting that is releived with standing and moving about.  We discussed how much exercise is too much and he rpeorts exercisesing 1 hour ler day and this appeared to be sufficient but need to ascertain if he is doing the same exercises or the whole booklet, Continue x 3 more session then have him to MD for injection    PT Treatment/Interventions  Therapeutic activities;Therapeutic exercise;Patient/family education;Manual techniques;Passive range of motion;Dry needling    PT Next Visit Plan  implanted medica; device NO ELECTRIC STIM ,   . EMphasis will be on manual for stretching/Mobs                         MET as needed,       Review HEP .    PT Home Exercise Plan  stretching of hamstrings and hip  , lateral shift  correction, SLR side lye Lt hip abduct   RT adduction    Consulted and Agree with Plan of Care  Patient       Patient will benefit from skilled therapeutic intervention in order to improve the following deficits and impairments:  Abnormal gait, Decreased balance, Decreased coordination, Decreased range of motion, Decreased mobility, Decreased strength, Difficulty walking, Postural dysfunction, Decreased activity tolerance, Pain  Visit Diagnosis: Pain in right thigh  Abnormal posture  Other abnormalities of gait and mobility     Problem List Patient Active Problem List   Diagnosis Date Noted  . Other bursitis of knee, left knee 12/06/2018  . Pain in right leg 12/06/2018  . Parkinson disease [facility])     Darrel Hoover  PT 01/16/2019, 8:14 AM  [facility] [address] [place], [name], [address] Phone: [phone]   Fax:  [phone]  Name: Ronald Noble MRN: [id number] Date of Birth: 09-Sep-1945

## 2019-01-25 ENCOUNTER — Other Ambulatory Visit: Payer: Self-pay

## 2019-01-25 ENCOUNTER — Ambulatory Visit: Payer: Medicare Other | Admitting: Physical Therapy

## 2019-01-25 ENCOUNTER — Encounter: Payer: Self-pay | Admitting: Physical Therapy

## 2019-01-25 DIAGNOSIS — M79651 Pain in right thigh: Secondary | ICD-10-CM

## 2019-01-25 DIAGNOSIS — R293 Abnormal posture: Secondary | ICD-10-CM

## 2019-01-25 DIAGNOSIS — R2681 Unsteadiness on feet: Secondary | ICD-10-CM | POA: Diagnosis not present

## 2019-01-25 DIAGNOSIS — R2689 Other abnormalities of gait and mobility: Secondary | ICD-10-CM

## 2019-01-25 DIAGNOSIS — R29818 Other symptoms and signs involving the nervous system: Secondary | ICD-10-CM | POA: Diagnosis not present

## 2019-01-25 NOTE — Therapy (Addendum)
[place], [name], [address] Phone: [phone]   Fax:  [phone]  Physical Therapy Treatment  Patient Details  Name: Ronald Noble MRN: [id number] Date of Birth: [birth year] Referring Provider (PT): Ronald Fears, MD  Progress Note Reporting Period 12/19/18 to 01/25/19 See note below for Objective Data and Assessment of Progress/Goals.      Encounter Date: 01/25/2019  PT End of Session - 01/25/19 0811    Visit Number  10    Number of Visits  14    Date for PT Re-Evaluation  02/02/19    Authorization Type  Medicare & BCBS - 10th visit PN    PT Start Time  0718    PT Stop Time  0756    PT Time Calculation (min)  38 min       Past Medical History:  Diagnosis Date  . BPH (benign prostatic hyperplasia)   . Parkinson disease ([place])   . Skin cancer of face    arms and back  . Sleep apnea    mild  . Spider bite    brown recluse/ left ankle/08/13    Past Surgical History:  Procedure Laterality Date  . BURR HOLE W/ STEREOTACTIC INSERTION OF DBS LEADS / INTRAOP MICROELECTRODE RECORDING  04/2014  . COLONOSCOPY    . KNEE SURGERY     exploratory/left  . SKIN CANCER EXCISION  01/2014   face  . TRANSURETHRAL RESECTION OF PROSTATE     2008    There were no vitals filed for this visit.  Subjective Assessment - 01/25/19 0809    Subjective  Pt arrives with limp and c/o pain lateral/posterior right distal thigh. He rates it as moderate 5/10.    Currently in Pain?  Yes    Pain Score  5     Pain Location  Leg    Pain Orientation  Upper;Lateral;Posterior    Pain Descriptors / Indicators  Throbbing    Pain Type  Chronic pain    Aggravating Factors   walking, sitting    Pain Relieving Factors  stretching                       [facility] Adult PT Treatment/Exercise - 01/25/19 0001      Knee/Hip Exercises: Prone   Hamstring Curl  15 reps   eccentric focus    Hamstring Curl  Limitations  5   bilat     Manual Therapy   Passive ROM  Prone quad, Side lying hip flexors and ITB, supine hamstring contract relax, ITB and adductors and knee to chest stretch, manual massage roller to ITB/vastus lateralis.                PT Short Term Goals - 01/09/19 9937      PT SHORT TERM GOAL #1   Title  Patient will be independent with initial HEP for balance and strengthening in order to build upon functional gains in therapy. ALL STG TARGET DATE 11/19/18        PT Long Term Goals - 01/16/19 0813      PT LONG TERM GOAL #7   Title  He will be independent with all exercises related to RT hamstring pain    Baseline  may advance    Status  On-going      PT LONG TERM GOAL #8   Title  He will report able to sit in eveining with 1-2 max pain in RT  hamstring.    Status  On-going      PT LONG TERM GOAL  #9   TITLE  He will be able to bend over with no hamstring pain    Baseline  tad better if stretched out, not as bad as it used to be    Status  On-going            Plan - 01/25/19 0813    Clinical Impression Statement  Pt reports pain causing him to limp with ambulation this morning. He did not bring his HEP. Focused PROM and massage roller to loosen hip/thigh musculature. Trial of eccentric hamstring strenthening in prone. This was challenging for him. After session, he reported decreased pain and decreased limp.    PT Next Visit Plan  Did he bring HEP?    implanted medica; device NO ELECTRIC STIM ,   . EMphasis will be on manual for stretching/Mobs                         MET as needed,       Review HEP .    PT Home Exercise Plan  stretching of hamstrings and hip  , lateral shift correction, SLR side lye Lt hip abduct   RT adduction       Patient will benefit from skilled therapeutic intervention in order to improve the following deficits and impairments:  Abnormal gait, Decreased balance, Decreased coordination, Decreased range of motion, Decreased mobility,  Decreased strength, Difficulty walking, Postural dysfunction, Decreased activity tolerance, Pain  Visit Diagnosis: Pain in right thigh  Abnormal posture  Other abnormalities of gait and mobility  Unsteadiness on feet     Problem List Patient Active Problem List   Diagnosis Date Noted  . Other bursitis of knee, left knee 12/06/2018  . Pain in right leg 12/06/2018  . Parkinson disease [facility])     Ronald Noble, [place] 01/25/2019, 8:16 AM  [place], [name], [address] Phone: [phone]   Fax:  [phone]  Name: Ronald Noble MRN: [id number] Date of Birth: 1946-01-26

## 2019-01-27 ENCOUNTER — Encounter

## 2019-01-31 ENCOUNTER — Other Ambulatory Visit: Payer: Self-pay

## 2019-01-31 ENCOUNTER — Ambulatory Visit: Payer: Medicare Other

## 2019-01-31 DIAGNOSIS — M79651 Pain in right thigh: Secondary | ICD-10-CM

## 2019-01-31 DIAGNOSIS — R293 Abnormal posture: Secondary | ICD-10-CM

## 2019-01-31 DIAGNOSIS — R2689 Other abnormalities of gait and mobility: Secondary | ICD-10-CM | POA: Diagnosis not present

## 2019-01-31 DIAGNOSIS — R2681 Unsteadiness on feet: Secondary | ICD-10-CM | POA: Diagnosis not present

## 2019-01-31 DIAGNOSIS — R29818 Other symptoms and signs involving the nervous system: Secondary | ICD-10-CM | POA: Diagnosis not present

## 2019-01-31 NOTE — Therapy (Signed)
[place], [name], [address] Phone: [phone]   Fax:  [phone]  Physical Therapy Treatment  Patient Details  Name: Ronald Noble MRN: [id number] Date of Birth: 1945/11/09 Referring Provider (PT): Joni Fears, MD   Encounter Date: 01/31/2019  PT End of Session - 01/31/19 0701    Visit Number  11    Number of Visits  15    Date for PT Re-Evaluation  02/24/19    Authorization Type  Medicare & BCBS - 10th visit PN    PT Start Time  0700    PT Stop Time  0740    PT Time Calculation (min)  40 min    Activity Tolerance  Patient tolerated treatment well;No increased pain    Behavior During Therapy  WFL for tasks assessed/performed       Past Medical History:  Diagnosis Date  . BPH (benign prostatic hyperplasia)   . Parkinson disease ([place])   . Skin cancer of face    arms and back  . Sleep apnea    mild  . Spider bite    brown recluse/ left ankle/08/13    Past Surgical History:  Procedure Laterality Date  . BURR HOLE W/ STEREOTACTIC INSERTION OF DBS LEADS / INTRAOP MICROELECTRODE RECORDING  04/2014  . COLONOSCOPY    . KNEE SURGERY     exploratory/left  . SKIN CANCER EXCISION  01/2014   face  . TRANSURETHRAL RESECTION OF PROSTATE     2008    There were no vitals filed for this visit.  Subjective Assessment - 01/31/19 0706    Subjective  Minor RT hip and thigh sore but eased after walking this AM.    Pain Score  1     Pain Location  Hip    Pain Orientation  Right;Lateral;Posterior    Pain Descriptors / Indicators  Sore    Pain Type  Chronic pain    Pain Frequency  Intermittent    Aggravating Factors   sit or walk prolonged    Pain Relieving Factors  strtching                       [facility] Adult PT Treatment/Exercise - 01/31/19 0001      Knee/Hip Exercises: Aerobic   Nustep  L 5 LE 5 min      Knee/Hip Exercises: Prone   Hamstring Curl  15 reps    eccentric      His entire HEP was reviewed and he was able to do these (decr reps due to time available) with minor cues.  No increased pain with these.  Will assess need for more exercises post  Spinal injections. Advised him to not exercise for a day or 2 post  injection        PT Short Term Goals - 01/09/19 9574      PT SHORT TERM GOAL #1   Title  Patient will be independent with initial HEP for balance and strengthening in order to build upon functional gains in therapy. ALL STG TARGET DATE 11/19/18        PT Long Term Goals - 01/16/19 0813      PT LONG TERM GOAL #7   Title  He will be independent with all exercises related to RT hamstring pain    Baseline  may advance    Status  On-going      PT LONG TERM GOAL #8   Title  He will report able to sit in eveining with 1-2 max pain in RT hamstring.    Status  On-going      PT LONG TERM GOAL  #9   TITLE  He will be able to bend over with no hamstring pain    Baseline  tad better if stretched out, not as bad as it used to be    Status  On-going            Plan - 01/31/19 0702    Clinical Impression Statement  Mr Ronald Noble will havve injection 02/06/19 so wil schedule 4 session post to see how injection helped and impact of exercise post injection.    PT Treatment/Interventions  Therapeutic activities;Therapeutic exercise;Patient/family education;Manual techniques;Passive range of motion;Dry needling    PT Next Visit Plan  Did he bring HEP?    implanted medica; device NO ELECTRIC STIM ,   . EMphasis will be on manual for stretching/Mobs                         MET as needed,       Review HEP .    PT Home Exercise Plan  stretching of hamstrings and hip  , lateral shift correction, SLR side lye Lt hip abduct   RT adduction    Consulted and Agree with Plan of Care  Patient       Patient will benefit from skilled therapeutic intervention in order to improve the following deficits and impairments:  Abnormal gait, Decreased  balance, Decreased coordination, Decreased range of motion, Decreased mobility, Decreased strength, Difficulty walking, Postural dysfunction, Decreased activity tolerance, Pain  Visit Diagnosis: Pain in right thigh  Abnormal posture  Other abnormalities of gait and mobility  Unsteadiness on feet     Problem List Patient Active Problem List   Diagnosis Date Noted  . Other bursitis of knee, left knee 12/06/2018  . Pain in right leg 12/06/2018  . Parkinson disease [facility])     Darrel Hoover  PT 01/31/2019, 7:54 AM  [facility] [address] [place], [name], [address] Phone: [phone]   Fax:  [phone]  Name: Ronald Noble MRN: [id number] Date of Birth: 1946-02-21

## 2019-02-03 ENCOUNTER — Ambulatory Visit: Payer: Medicare Other | Admitting: Physical Therapy

## 2019-02-06 ENCOUNTER — Encounter: Payer: Self-pay | Admitting: Physical Medicine and Rehabilitation

## 2019-02-06 ENCOUNTER — Ambulatory Visit: Payer: Self-pay

## 2019-02-06 ENCOUNTER — Ambulatory Visit (INDEPENDENT_AMBULATORY_CARE_PROVIDER_SITE_OTHER): Payer: Medicare Other | Admitting: Physical Medicine and Rehabilitation

## 2019-02-06 VITALS — BP 127/79 | HR 67

## 2019-02-06 DIAGNOSIS — R310 Gross hematuria: Secondary | ICD-10-CM | POA: Diagnosis not present

## 2019-02-06 DIAGNOSIS — R350 Frequency of micturition: Secondary | ICD-10-CM | POA: Diagnosis not present

## 2019-02-06 DIAGNOSIS — M5416 Radiculopathy, lumbar region: Secondary | ICD-10-CM

## 2019-02-06 DIAGNOSIS — N401 Enlarged prostate with lower urinary tract symptoms: Secondary | ICD-10-CM | POA: Diagnosis not present

## 2019-02-06 MED ORDER — BETAMETHASONE SOD PHOS & ACET 6 (3-3) MG/ML IJ SUSP
12.0000 mg | Freq: Once | INTRAMUSCULAR | Status: AC
  Administered 2019-02-06: 10:00:00 12 mg

## 2019-02-06 NOTE — Progress Notes (Signed)
.  Numeric Pain Rating Scale and Functional Assessment Average Pain 7   In the last MONTH (on 0-10 scale) has pain interfered with the following?  1. General activity like being  able to carry out your everyday physical activities such as walking, climbing stairs, carrying groceries, or moving a chair?  Rating(8)   +Driver, -BT, -Dye Allergies.  

## 2019-02-06 NOTE — Progress Notes (Signed)
Ronald Noble - 73 y.o. male MRN [id number]  Date of birth: 1946/01/27  Office Visit Note: Visit Date: 02/06/2019 PCP: [name], MD Referred by: [name], MD  Subjective: Chief Complaint  Patient presents with  . Lower Back - Pain  . Right Leg - Pain   HPI: Ronald Noble is a 73 y.o. male who comes in today At the request of Dr. Joni Fears for right L4 transforaminal epidural steroid injection diagnostically hopefully therapeutically.  His case is complicated by ongoing Parkinson's disease for which she does have a deep brain stimulator.  CT scan of the lumbar spine was performed showing scoliosis and degenerative changes particular at L4-5 with there was a small grade 1 listhesis and moderate right foraminal narrowing but no high-grade central stenosis.  Obviously MRI may show more sensitive changes to disc or ligamentum changes that could affect the level of stenosis.  Patient did have his wife on the telephone during the office visit.  We did take 10 minutes or so to explain what a physiatrist was and she seemed convinced that hopefully we are going to help him at least he was in the right place.  Interestingly his symptoms seem to be different than when he saw Dr. Durward Fortes.  He relates pain into his right thigh both lateral and medial as well as calf both lateral and medial.  He reports some instability with trying to step forward on the right leg.  He has not noted any foot drop or focal weakness.  We are going to complete a diagnostic L4 transforaminal injection and to see how he does with that and he will report back to me and Dr. Durward Fortes concerning relief with that.  He does report that he could have an MRI but it would be a very limited type of an MRI that could be done at [facility].  ROS Otherwise per HPI.  Assessment & Plan: Visit Diagnoses:  1. Lumbar radiculopathy     Plan: No additional findings.   Meds & Orders:  Meds ordered this encounter   Medications  . betamethasone acetate-betamethasone sodium phosphate (CELESTONE) injection 12 mg    Orders Placed This Encounter  Procedures  . XR C-ARM NO REPORT  . Epidural Steroid injection    Follow-up: Return if symptoms worsen or fail to improve.   Procedures: No procedures performed  Lumbosacral Transforaminal Epidural Steroid Injection - Sub-Pedicular Approach with Fluoroscopic Guidance  Patient: Ronald Noble      Date of Birth: 01-04-1946 MRN: [id number] PCP: [name], MD      Visit Date: 02/06/2019   Universal Protocol:    Date/Time: 02/06/2019  Consent Given By: the patient  Position: PRONE  Additional Comments: Vital signs were monitored before and after the procedure. Patient was prepped and draped in the usual sterile fashion. The correct patient, procedure, and site was verified.   Injection Procedure Details:  Procedure Site One Meds Administered:  Meds ordered this encounter  Medications  . betamethasone acetate-betamethasone sodium phosphate (CELESTONE) injection 12 mg    Laterality: Right  Location/Site:  L4-L5  Needle size: 22 G  Needle type: Spinal  Needle Placement: Transforaminal  Findings:    -Comments: Excellent flow of contrast along the nerve and into the epidural space.  Procedure Details: After squaring off the end-plates to get a true AP view, the C-arm was positioned so that an oblique view of the foramen as noted above was visualized. The target area is just inferior  to the "nose of the scotty dog" or sub pedicular. The soft tissues overlying this structure were infiltrated with 2-3 ml. of 1% Lidocaine without Epinephrine.  The spinal needle was inserted toward the target using a "trajectory" view along the fluoroscope beam.  Under AP and lateral visualization, the needle was advanced so it did not puncture dura and was located close the 6 O'Clock position of the pedical in AP tracterory. Biplanar projections were  used to confirm position. Aspiration was confirmed to be negative for CSF and/or blood. A 1-2 ml. volume of Isovue-250 was injected and flow of contrast was noted at each level. Radiographs were obtained for documentation purposes.   After attaining the desired flow of contrast documented above, a 0.5 to 1.0 ml test dose of 0.25% Marcaine was injected into each respective transforaminal space.  The patient was observed for 90 seconds post injection.  After no sensory deficits were reported, and normal lower extremity motor function was noted,   the above injectate was administered so that equal amounts of the injectate were placed at each foramen (level) into the transforaminal epidural space.   Additional Comments:  The patient tolerated the procedure well Dressing: 2 x 2 sterile gauze and Band-Aid    Post-procedure details: Patient was observed during the procedure. Post-procedure instructions were reviewed.  Patient left the clinic in stable condition.    Clinical History: No specialty comments available.   He reports that he has never smoked. He has never used smokeless tobacco. No results for input(s): HGBA1C, LABURIC in the last 8760 hours.  Objective:  VS:  HT:    WT:   BMI:     BP:127/79  HR:67bpm  TEMP: ( )  RESP:  Physical Exam  Ortho Exam Imaging: Xr C-arm No Report  Result Date: 02/06/2019 Please see Notes tab for imaging impression.   Past Medical/Family/Surgical/Social History: Medications & Allergies reviewed per EMR, new medications updated. Patient Active Problem List   Diagnosis Date Noted  . Other bursitis of knee, left knee 12/06/2018  . Pain in right leg 12/06/2018  . Parkinson disease [facility])    Past Medical History:  Diagnosis Date  . BPH (benign prostatic hyperplasia)   . Parkinson disease ([place])   . Skin cancer of face    arms and back  . Sleep apnea    mild  . Spider bite    brown recluse/ left ankle/08/13   Family History  Problem  Relation Age of Onset  . COPD Mother   . Cancer Father        Bladder  . Colon cancer Neg Hx   . Esophageal cancer Neg Hx   . Stomach cancer Neg Hx    Past Surgical History:  Procedure Laterality Date  . BURR HOLE W/ STEREOTACTIC INSERTION OF DBS LEADS / INTRAOP MICROELECTRODE RECORDING  04/2014  . COLONOSCOPY    . KNEE SURGERY     exploratory/left  . SKIN CANCER EXCISION  01/2014   face  . TRANSURETHRAL RESECTION OF PROSTATE     2008   Social History   Occupational History  . Occupation: LAWYER    Employer: [employer]  Tobacco Use  . Smoking status: Never Smoker  . Smokeless tobacco: Never Used  Substance and Sexual Activity  . Alcohol use: Yes    Alcohol/week: 5.0 standard drinks    Types: 3 Glasses of wine, 2 Cans of beer per week    Comment: occasional  . Drug use: No  .  Sexual activity: Not on file

## 2019-02-06 NOTE — Procedures (Signed)
Lumbosacral Transforaminal Epidural Steroid Injection - Sub-Pedicular Approach with Fluoroscopic Guidance  Patient: Ronald Noble      Date of Birth: 03/01/1946 MRN: [id number] PCP: [name], MD      Visit Date: 02/06/2019   Universal Protocol:    Date/Time: 02/06/2019  Consent Given By: the patient  Position: PRONE  Additional Comments: Vital signs were monitored before and after the procedure. Patient was prepped and draped in the usual sterile fashion. The correct patient, procedure, and site was verified.   Injection Procedure Details:  Procedure Site One Meds Administered:  Meds ordered this encounter  Medications  . betamethasone acetate-betamethasone sodium phosphate (CELESTONE) injection 12 mg    Laterality: Right  Location/Site:  L4-L5  Needle size: 22 G  Needle type: Spinal  Needle Placement: Transforaminal  Findings:    -Comments: Excellent flow of contrast along the nerve and into the epidural space.  Procedure Details: After squaring off the end-plates to get a true AP view, the C-arm was positioned so that an oblique view of the foramen as noted above was visualized. The target area is just inferior to the "nose of the scotty dog" or sub pedicular. The soft tissues overlying this structure were infiltrated with 2-3 ml. of 1% Lidocaine without Epinephrine.  The spinal needle was inserted toward the target using a "trajectory" view along the fluoroscope beam.  Under AP and lateral visualization, the needle was advanced so it did not puncture dura and was located close the 6 O'Clock position of the pedical in AP tracterory. Biplanar projections were used to confirm position. Aspiration was confirmed to be negative for CSF and/or blood. A 1-2 ml. volume of Isovue-250 was injected and flow of contrast was noted at each level. Radiographs were obtained for documentation purposes.   After attaining the desired flow of contrast documented above, a 0.5  to 1.0 ml test dose of 0.25% Marcaine was injected into each respective transforaminal space.  The patient was observed for 90 seconds post injection.  After no sensory deficits were reported, and normal lower extremity motor function was noted,   the above injectate was administered so that equal amounts of the injectate were placed at each foramen (level) into the transforaminal epidural space.   Additional Comments:  The patient tolerated the procedure well Dressing: 2 x 2 sterile gauze and Band-Aid    Post-procedure details: Patient was observed during the procedure. Post-procedure instructions were reviewed.  Patient left the clinic in stable condition.

## 2019-02-07 DIAGNOSIS — Z85828 Personal history of other malignant neoplasm of skin: Secondary | ICD-10-CM | POA: Diagnosis not present

## 2019-02-07 DIAGNOSIS — L720 Epidermal cyst: Secondary | ICD-10-CM | POA: Diagnosis not present

## 2019-02-10 DIAGNOSIS — N320 Bladder-neck obstruction: Secondary | ICD-10-CM | POA: Diagnosis not present

## 2019-02-14 ENCOUNTER — Other Ambulatory Visit: Payer: Self-pay

## 2019-02-14 ENCOUNTER — Ambulatory Visit: Payer: Medicare Other | Attending: Orthopaedic Surgery

## 2019-02-14 DIAGNOSIS — M79651 Pain in right thigh: Secondary | ICD-10-CM | POA: Diagnosis not present

## 2019-02-14 DIAGNOSIS — Z85828 Personal history of other malignant neoplasm of skin: Secondary | ICD-10-CM | POA: Diagnosis not present

## 2019-02-14 DIAGNOSIS — R293 Abnormal posture: Secondary | ICD-10-CM | POA: Insufficient documentation

## 2019-02-14 DIAGNOSIS — D2271 Melanocytic nevi of right lower limb, including hip: Secondary | ICD-10-CM | POA: Diagnosis not present

## 2019-02-14 DIAGNOSIS — L821 Other seborrheic keratosis: Secondary | ICD-10-CM | POA: Diagnosis not present

## 2019-02-14 DIAGNOSIS — D1801 Hemangioma of skin and subcutaneous tissue: Secondary | ICD-10-CM | POA: Diagnosis not present

## 2019-02-14 DIAGNOSIS — L814 Other melanin hyperpigmentation: Secondary | ICD-10-CM | POA: Diagnosis not present

## 2019-02-14 DIAGNOSIS — L565 Disseminated superficial actinic porokeratosis (DSAP): Secondary | ICD-10-CM | POA: Diagnosis not present

## 2019-02-14 DIAGNOSIS — L570 Actinic keratosis: Secondary | ICD-10-CM | POA: Diagnosis not present

## 2019-02-14 DIAGNOSIS — D225 Melanocytic nevi of trunk: Secondary | ICD-10-CM | POA: Diagnosis not present

## 2019-02-14 DIAGNOSIS — R2689 Other abnormalities of gait and mobility: Secondary | ICD-10-CM | POA: Insufficient documentation

## 2019-02-14 NOTE — Therapy (Signed)
[place], [name], [address] Phone: [phone]   Fax:  [phone]  Physical Therapy Treatment  Patient Details  Name: Ronald Noble MRN: [id number] Date of Birth: [birth year] Referring Provider (PT): Joni Fears, MD   Encounter Date: 02/14/2019  PT End of Session - 02/14/19 1706    Visit Number  12    Number of Visits  15    Date for PT Re-Evaluation  02/24/19    Authorization Type  Medicare & BCBS - 10th visit PN    PT Start Time  0410    PT Stop Time  0452    PT Time Calculation (min)  42 min    Activity Tolerance  Patient tolerated treatment well;No increased pain    Behavior During Therapy  WFL for tasks assessed/performed       Past Medical History:  Diagnosis Date  . BPH (benign prostatic hyperplasia)   . Parkinson disease ([place])   . Skin cancer of face    arms and back  . Sleep apnea    mild  . Spider bite    brown recluse/ left ankle/08/13    Past Surgical History:  Procedure Laterality Date  . BURR HOLE W/ STEREOTACTIC INSERTION OF DBS LEADS / INTRAOP MICROELECTRODE RECORDING  04/2014  . COLONOSCOPY    . KNEE SURGERY     exploratory/left  . SKIN CANCER EXCISION  01/2014   face  . TRANSURETHRAL RESECTION OF PROSTATE     2008    There were no vitals filed for this visit.  Subjective Assessment - 02/14/19 1613    Subjective  Injection may have helpe d back some  but  no change in RT leg pain.       Pain still increased with sitting.  Bird dog exercises was difficult last time tried    Pain Score  1     Pain Location  --   thigh   Pain Orientation  Right;Posterior    Pain Descriptors / Indicators  Sore   lateral proximal calf.   Pain Type  Chronic pain    Pain Onset  More than a month ago    Pain Frequency  Intermittent    Aggravating Factors   sitting    Pain Relieving Factors  stretch                       [facility] Adult PT Treatment/Exercise  - 02/14/19 0001      Knee/Hip Exercises: Stretches   Other Knee/Hip Stretches  standing  each foot on the 4 inch box  reach to toes  much easier with the left foot down.       Knee/Hip Exercises: Aerobic   Recumbent Bike  L2 5 min      Knee/Hip Exercises: Supine   Straight Leg Raises  Right;Left;10 reps      Knee/Hip Exercises: Prone   Other Prone Exercises  manual resisted  hip ER      Manual Therapy   Manual Therapy  Soft tissue mobilization;Myofascial release    Myofascial Release  looked for trigger points in hip and lower back and could nont find any thing that was tender and no increase tenderness with prolonged pressue     Passive ROM  Prone quad, and IR stethcing.  Side lying hip flexors and ITB, supine hamstring contract relax, ITB and adductors and knee to chest stretch,   hamstrings.  PT Short Term Goals - 01/09/19 [id number]      PT SHORT TERM GOAL #1   Title  Patient will be independent with initial HEP for balance and strengthening in order to build upon functional gains in therapy. ALL STG TARGET DATE 11/19/18        PT Long Term Goals - 01/16/19 0813      PT LONG TERM GOAL #7   Title  He will be independent with all exercises related to RT hamstring pain    Baseline  may advance    Status  On-going      PT LONG TERM GOAL #8   Title  He will report able to sit in eveining with 1-2 max pain in RT hamstring.    Status  On-going      PT LONG TERM GOAL  #9   TITLE  He will be able to bend over with no hamstring pain    Baseline  tad better if stretched out, not as bad as it used to be    Status  On-going            Plan - 02/14/19 1706    Clinical Impression Statement  Spinal injection did not stop RT thigh and calf pain. He continues  with RT leg symptoms post sitting for period more in the PM than AM.    No palpation was avble to elicite the pain post sitting.   He is tinger in psoterior chain for the RT leg and quad and hip rotaiton so  eill continue for 3 more sessionto stretch these areas    PT Next Visit Plan  NO ELECTRIC STIM       Continue stretching ,  palapte  RT  hamstring and calf  to  see if elicites pain    PT Home Exercise Plan  stretching of hamstrings and hip  , lateral shift correction, SLR side lye Lt hip abduct   RT adduction    Consulted and Agree with Plan of Care  Patient       Patient will benefit from skilled therapeutic intervention in order to improve the following deficits and impairments:  Abnormal gait, Decreased balance, Decreased coordination, Decreased range of motion, Decreased mobility, Decreased strength, Difficulty walking, Postural dysfunction, Decreased activity tolerance, Pain  Visit Diagnosis: Pain in right thigh  Abnormal posture  Other abnormalities of gait and mobility     Problem List Patient Active Problem List   Diagnosis Date Noted  . Other bursitis of knee, left knee 12/06/2018  . Pain in right leg 12/06/2018  . Parkinson disease [facility])     Darrel Hoover  PT 02/14/2019, 5:10 PM  [facility] [address] [place], [name], [address] Phone: [phone]   Fax:  [phone]  Name: Ronald Noble MRN: [id number] Date of Birth: 1945-07-27

## 2019-02-20 ENCOUNTER — Other Ambulatory Visit: Payer: Self-pay

## 2019-02-20 ENCOUNTER — Ambulatory Visit: Payer: Medicare Other

## 2019-02-20 DIAGNOSIS — R293 Abnormal posture: Secondary | ICD-10-CM | POA: Diagnosis not present

## 2019-02-20 DIAGNOSIS — M79651 Pain in right thigh: Secondary | ICD-10-CM | POA: Diagnosis not present

## 2019-02-20 DIAGNOSIS — R2689 Other abnormalities of gait and mobility: Secondary | ICD-10-CM | POA: Diagnosis not present

## 2019-02-20 NOTE — Therapy (Signed)
[place], [name], [address] Phone: [phone]   Fax:  [phone]  Physical Therapy Treatment  Patient Details  Name: Annette Yockel MRN: [id number] Date of Birth: 07-06-1945 Referring Provider (PT): Joni Fears, MD   Encounter Date: 02/20/2019  PT End of Session - 02/20/19 0707    Visit Number  11    Date for PT Re-Evaluation  02/24/19    Authorization Type  Medicare & BCBS - 10th visit PN    PT Start Time  0702    PT Stop Time  0743    PT Time Calculation (min)  41 min    Activity Tolerance  Patient tolerated treatment well;No increased pain    Behavior During Therapy  WFL for tasks assessed/performed       Past Medical History:  Diagnosis Date  . BPH (benign prostatic hyperplasia)   . Parkinson disease ([place])   . Skin cancer of face    arms and back  . Sleep apnea    mild  . Spider bite    brown recluse/ left ankle/08/13    Past Surgical History:  Procedure Laterality Date  . BURR HOLE W/ STEREOTACTIC INSERTION OF DBS LEADS / INTRAOP MICROELECTRODE RECORDING  04/2014  . COLONOSCOPY    . KNEE SURGERY     exploratory/left  . SKIN CANCER EXCISION  01/2014   face  . TRANSURETHRAL RESECTION OF PROSTATE     2008    There were no vitals filed for this visit.  Subjective Assessment - 02/20/19 0708    Subjective  No changes                       [facility] Adult PT Treatment/Exercise - 02/20/19 0001      Knee/Hip Exercises: Stretches   Passive Hamstring Stretch  Right;2 reps;60 seconds      Knee/Hip Exercises: Aerobic   Nustep  L6 5 min      Knee/Hip Exercises: Supine   Bridges Limitations  30 reps then single leg RT and LT       Knee/Hip Exercises: Prone   Hamstring Curl Limitations  30 reps    Hip Extension  20 reps    Hip Extension Limitations  PT held knee in 90  degrees flexion   2 sets    Other Prone Exercises  manual resisted  hip ER      Manual  Therapy   Manual Therapy  Soft tissue mobilization    Soft tissue mobilization  RT hAMSTRINGS     Passive ROM  prone quad and rotation stretching  and hip flexor stretching.              PT Education - 02/20/19 0801    Education Details  HEP,   Stop any exercuise that starts pain in posterior thigh    Person(s) Educated  Patient    Methods  Explanation;Verbal cues;Handout;Tactile cues    Comprehension  Returned demonstration;Verbalized understanding       PT Short Term Goals - 01/09/19 0713      PT SHORT TERM GOAL #1   Title  Patient will be independent with initial HEP for balance and strengthening in order to build upon functional gains in therapy. ALL STG TARGET DATE 11/19/18        PT Long Term Goals - 01/16/19 0813      PT LONG TERM GOAL #7   Title  He will be independent with  all exercises related to RT hamstring pain    Baseline  may advance    Status  On-going      PT LONG TERM GOAL #8   Title  He will report able to sit in eveining with 1-2 max pain in RT hamstring.    Status  On-going      PT LONG TERM GOAL  #9   TITLE  He will be able to bend over with no hamstring pain    Baseline  tad better if stretched out, not as bad as it used to be    Status  On-going            Plan - 02/20/19 0707    Clinical Impression Statement  Tender point found RT hamstrings  mid way in thigh.  He had pain each time he lifted RT leg into hip extension  but not with knee flexed .  It appears weakness and stiffness of RT hip extensors causes RT hamst hamstrings to be overactive and created pain and muscle tenderness. We may have the Dn therapist see him for 2-3 sessions to see if this may be of benefit. Along with gluteaal strength.    PT Treatment/Interventions  Therapeutic activities;Therapeutic exercise;Patient/family education;Manual techniques;Passive range of motion;Dry needling    PT Next Visit Plan  NO ELECTRIC STIM       Continue stretching ,  palapte  RT   hamstring and calf  to  see if elicites pain    PT Home Exercise Plan  stretching of hamstrings and hip  , lateral shift correction, SLR side lye Lt hip abduct   RT adduction    Consulted and Agree with Plan of Care  Patient       Patient will benefit from skilled therapeutic intervention in order to improve the following deficits and impairments:  Abnormal gait, Decreased balance, Decreased coordination, Decreased range of motion, Decreased mobility, Decreased strength, Difficulty walking, Postural dysfunction, Decreased activity tolerance, Pain  Visit Diagnosis: Pain in right thigh  Abnormal posture  Other abnormalities of gait and mobility     Problem List Patient Active Problem List   Diagnosis Date Noted  . Other bursitis of knee, left knee 12/06/2018  . Pain in right leg 12/06/2018  . Parkinson disease [facility])     Darrel Hoover  PT 02/20/2019, 8:04 AM  [facility] [address] [place], [name], [address] Phone: [phone]   Fax:  [phone]  Name: Sundance Gama MRN: [id number] Date of Birth: [birth year]

## 2019-02-20 NOTE — Patient Instructions (Signed)
Bilateral bridge, single leg bridge , hamstring curls, side lie clam with band , prone hip ext with knee bent.   30 reps daily

## 2019-02-23 ENCOUNTER — Ambulatory Visit: Payer: Medicare Other

## 2019-02-23 ENCOUNTER — Other Ambulatory Visit: Payer: Self-pay

## 2019-02-23 DIAGNOSIS — R2689 Other abnormalities of gait and mobility: Secondary | ICD-10-CM

## 2019-02-23 DIAGNOSIS — R293 Abnormal posture: Secondary | ICD-10-CM

## 2019-02-23 DIAGNOSIS — M79651 Pain in right thigh: Secondary | ICD-10-CM

## 2019-02-23 NOTE — Therapy (Addendum)
[place], [name], [address] Phone: [phone]   Fax:  [phone]  Physical Therapy Treatment/Discharge  Patient Details  Name: Benecio Kluger MRN: [id number] Date of Birth: 04-03-1946 Referring Provider (PT): Joni Fears, MD   Encounter Date: 02/23/2019  PT End of Session - 02/23/19 0709    Visit Number  12    Number of Visits  15    Date for PT Re-Evaluation  02/24/19    Authorization Type  Medicare & BCBS - 10th visit PN    PT Start Time  0704    PT Stop Time  0744    PT Time Calculation (min)  40 min    Activity Tolerance  Patient tolerated treatment well;No increased pain    Behavior During Therapy  WFL for tasks assessed/performed       Past Medical History:  Diagnosis Date  . BPH (benign prostatic hyperplasia)   . Parkinson disease ([place])   . Skin cancer of face    arms and back  . Sleep apnea    mild  . Spider bite    brown recluse/ left ankle/08/13    Past Surgical History:  Procedure Laterality Date  . BURR HOLE W/ STEREOTACTIC INSERTION OF DBS LEADS / INTRAOP MICROELECTRODE RECORDING  04/2014  . COLONOSCOPY    . KNEE SURGERY     exploratory/left  . SKIN CANCER EXCISION  01/2014   face  . TRANSURETHRAL RESECTION OF PROSTATE     2008    There were no vitals filed for this visit.  Subjective Assessment - 02/23/19 0709    Subjective  No changes. i have some questions about HEp.  the past few days it has felt some better. May be coincidence    Currently in Pain?  No/denies                       [facility] Adult PT Treatment/Exercise - 02/23/19 0001      Knee/Hip Exercises: Stretches   Passive Hamstring Stretch  Right;2 reps;60 seconds    Passive Hamstring Stretch Limitations  during stretch diid 4 sets of 5 active SLR stretch with abdominal activity with head/shoulder lift.       Knee/Hip Exercises: Aerobic   Recumbent Bike  L2 5 min      Knee/Hip  Exercises: Supine   Bridges  15 reps    Single Leg Bridge  Right;15 reps   then 15 with leg off mat and foot on 8 Inch stool     Knee/Hip Exercises: Sidelying   Hip ABduction  Right;15 reps    Clams  blue band x 15      Knee/Hip Exercises: Prone   Hamstring Curl Limitations  30 reps    Hip Extension  20 reps    Hip Extension Limitations  PT held knee in 90  degrees flexion   2 sets      Manual Therapy   Passive ROM  prone quad and rotation stretching  and hip flexor stretching.                PT Short Term Goals - 01/09/19 2500      PT SHORT TERM GOAL #1   Title  Patient will be independent with initial HEP for balance and strengthening in order to build upon functional gains in therapy. ALL STG TARGET DATE 11/19/18        PT Long Term Goals - 01/16/19 3704  PT LONG TERM GOAL #7   Title  He will be independent with all exercises related to RT hamstring pain    Baseline  may advance    Status  On-going      PT LONG TERM GOAL #8   Title  He will report able to sit in eveining with 1-2 max pain in RT hamstring.    Status  On-going      PT LONG TERM GOAL  #9   TITLE  He will be able to bend over with no hamstring pain    Baseline  tad better if stretched out, not as bad as it used to be    Status  On-going            Plan - 02/23/19 0709    Clinical Impression Statement  Tolerated all exercises . Corrected HEP exercises as needed    PT Treatment/Interventions  Therapeutic activities;Therapeutic exercise;Patient/family education;Manual techniques;Passive range of motion;Dry needling    PT Next Visit Plan  NO ELECTRIC STIM       Continue stretching ,  palapte  RT  hamstring and calf  to  see if elicites pain    PT Home Exercise Plan  stretching of hamstrings and hip  , lateral shift correction, SLR side lye Lt hip abduct   RT adduction, gluteal strengthening.    Consulted and Agree with Plan of Care  Patient       Patient will benefit from skilled  therapeutic intervention in order to improve the following deficits and impairments:  Abnormal gait, Decreased balance, Decreased coordination, Decreased range of motion, Decreased mobility, Decreased strength, Difficulty walking, Postural dysfunction, Decreased activity tolerance, Pain  Visit Diagnosis: Pain in right thigh  Abnormal posture  Other abnormalities of gait and mobility     Problem List Patient Active Problem List   Diagnosis Date Noted  . Other bursitis of knee, left knee 12/06/2018  . Pain in right leg 12/06/2018  . Parkinson disease [facility])     Darrel Hoover  PT 02/23/2019, 7:53 AM  [facility] [address] [place], [name], [address] Phone: [phone]   Fax:  [phone]  Name: Saifan Rayford MRN: [id number] Date of Birth: Aug 08, 1945 PHYSICAL THERAPY DISCHARGE SUMMARY  Visits from Start of Care: 12 Current functional level related to goals / functional outcomes: See above    Remaining deficits: See above   Education / Equipment: HEP Plan:                                                    Patient goals were partially met. Patient is being discharged due to not returning since the last visit.  ?????     Pearson Forster  PT  03/30/19

## 2019-03-10 ENCOUNTER — Other Ambulatory Visit: Payer: Self-pay

## 2019-03-10 DIAGNOSIS — Z20822 Contact with and (suspected) exposure to covid-19: Secondary | ICD-10-CM

## 2019-03-10 DIAGNOSIS — Z20828 Contact with and (suspected) exposure to other viral communicable diseases: Secondary | ICD-10-CM | POA: Diagnosis not present

## 2019-03-12 LAB — NOVEL CORONAVIRUS, NAA: SARS-CoV-2, NAA: NOT DETECTED

## 2019-03-14 ENCOUNTER — Telehealth: Payer: Self-pay

## 2019-03-14 NOTE — Telephone Encounter (Signed)
Patient given negative result and verbalized understanding  

## 2019-03-22 DIAGNOSIS — G245 Blepharospasm: Secondary | ICD-10-CM | POA: Diagnosis not present

## 2019-03-22 DIAGNOSIS — K117 Disturbances of salivary secretion: Secondary | ICD-10-CM | POA: Diagnosis not present

## 2019-03-22 DIAGNOSIS — G20 Parkinson's disease: Secondary | ICD-10-CM | POA: Diagnosis not present

## 2019-03-22 DIAGNOSIS — Z4542 Encounter for adjustment and management of neuropacemaker (brain) (peripheral nerve) (spinal cord): Secondary | ICD-10-CM | POA: Diagnosis not present

## 2019-04-12 DIAGNOSIS — H9193 Unspecified hearing loss, bilateral: Secondary | ICD-10-CM | POA: Diagnosis not present

## 2019-04-14 DIAGNOSIS — H903 Sensorineural hearing loss, bilateral: Secondary | ICD-10-CM | POA: Diagnosis not present

## 2019-04-28 ENCOUNTER — Ambulatory Visit: Payer: Medicare Other | Attending: Internal Medicine

## 2019-04-28 DIAGNOSIS — Z23 Encounter for immunization: Secondary | ICD-10-CM | POA: Insufficient documentation

## 2019-04-28 NOTE — Progress Notes (Signed)
   Covid-19 Vaccination Clinic  Name:  Ronald Noble    MRN: [id number] DOB: 1945-12-31  04/28/2019  Mr. Grass was observed post Covid-19 immunization for 15 minutes without incidence. He was provided with Vaccine Information Sheet and instruction to access the V-Safe system.   [name] was instructed to call 911 with any severe reactions post vaccine: Marland Kitchen Difficulty breathing  . Swelling of your face and throat  . A fast heartbeat  . A bad rash all over your body  . Dizziness and weakness    Immunizations Administered    Name Date Dose VIS Date Route   Pfizer COVID-19 Vaccine 04/28/2019  2:54 PM 0.3 mL 03/17/2019 Intramuscular   Manufacturer: [place]   Lot: [id number]   [place]: [id number]    ,

## 2019-05-03 ENCOUNTER — Ambulatory Visit (INDEPENDENT_AMBULATORY_CARE_PROVIDER_SITE_OTHER): Payer: Medicare Other | Admitting: Neurology

## 2019-05-03 ENCOUNTER — Encounter: Payer: Self-pay | Admitting: Neurology

## 2019-05-03 ENCOUNTER — Other Ambulatory Visit: Payer: Self-pay

## 2019-05-03 VITALS — BP 134/81 | HR 61 | Temp 96.9°F | Ht 71.0 in | Wt 158.0 lb

## 2019-05-03 DIAGNOSIS — G479 Sleep disorder, unspecified: Secondary | ICD-10-CM | POA: Diagnosis not present

## 2019-05-03 DIAGNOSIS — G20 Parkinson's disease: Secondary | ICD-10-CM

## 2019-05-03 DIAGNOSIS — G4752 REM sleep behavior disorder: Secondary | ICD-10-CM

## 2019-05-03 DIAGNOSIS — Z9689 Presence of other specified functional implants: Secondary | ICD-10-CM | POA: Diagnosis not present

## 2019-05-03 MED ORDER — CLONAZEPAM 0.25 MG PO TBDP: 0.2500 mg | ORAL_TABLET | Freq: Every day | ORAL | 5 refills | Status: DC

## 2019-05-03 NOTE — Patient Instructions (Addendum)
Your exam is stable.  I am glad you are doing well. Please continue with your current medications. I think your prescriptions are up-to-date and typically done through [facility].  I have not prescribed her amantadine or Sinemet or ropinirole in quite some time.

## 2019-05-03 NOTE — Progress Notes (Signed)
Subjective:    Patient ID: Ronald Noble is a 74 y.o. male.  HPI     Interim history:   Ronald Noble is a 74 year old right-handed gentleman with an underlying medical history of skin cancer including melanoma, enlarged prostate (s/p TURP), who presents for followup consultation of his R sided predominant Parkinson's disease, complicated by RBD and sleep disturbance as well as mild dyskinesias, with s/p bilateral DBS placements at [facility], s/p L IPG replacement on the L in 09/2018. He is accompanied by his wife again today. I last saw him on 11/01/2018, at which time he reported more problems with his balance and difficulty with freezing and also difficulty stopping when needed.  He had interim left IPG replacement on 09/23/2018.  He has been receiving Botox injections for his blepharospasm.  He did report a fall and bumped his right hip.  He had an x-ray which was negative for any bony injuries.  He was advised to monitor his hip pain.  He was advised to continue with his Parkinson's medications.   Today, 05/03/2019: He reports overall doing well.  They both received their COVID-19 vaccinations.  He has had no recent falls.  After his fall in the summer, he had problems with his lower back and right knee.  He had to use a cane for a while.  He had some physical therapy which helped and also had a steroid injection in early November 2020.  After his last Botox injection in mid December 2020 his wife noted that his left eyelid was more droopy after a couple of weeks.  He has noticed more facial dyskinesias.  It bothers him some time that he looks like he is in pain or is angry.  Other than that, he does not have a whole lot of dyskinesias.  He denies any significant constipation.  He tries to stay active.  He also had an interim problem with his prostate, years ago he had TURP, he then developed recent urinary retention, had to have a catheter placed for about a week due to adhesions or scar tissue most  likely from past surgery.  He is doing better.   He had an interim appointment with Dr. Deboraha Sprang at [facility] on 03/22/2019.  He had additional programming as well.    The patient's allergies, current medications, family history, past medical history, past social history, past surgical history and problem list were reviewed and updated as appropriate.    Previously (copied from previous notes for reference):   I saw him on 04/28/2018, at which time he reported doing well, had no falls, weight has been stable.  I suggested he keep his medications the same.      I saw him on 10/26/2017, at which time he reported doing okay, tried Gocovri for one month, but it was cost prohibitive. He was back on amantadine 100 mg bid. He tried the reduced dose of amantadine but did not do well on the lower dose. He reported no recent falls. Was getting Botox injections for blepharospasm through [name]. He was on Sinemet 1-1/2 pills 5 times a day, long-acting Requip once daily at 5 PM. I asked him to continue with his medication regimen.   I saw him on 04/28/2017, at which time he reported some daytime somnolence. He was supposed to get cataract surgeries and colonoscopy done. He had done well after Botox injections at [facility]. He was on amantadine twice daily and I suggested we reduce it to once daily  and consider taking him off altogether. I suggested he take his long-acting Requip 6 mg once daily in the evening, instead of during the day. We kept his Sinemet at 1-1/2 pills 5 times a day.     I saw him on 10/29/2016, at which time he reported doing okay, he had no recent falls, his left eye ptosis improved after adjustment of his DBS. He did not end up having any Botox injections to his eyelid. He was on Sinemet 1-1/2 pills 5 times a day, 3 hourly intervals, starting approximately at 8 AM. He was on amantadine twice daily and Requip XL around 1 PM.    I saw him on 04/09/2016, at which time he was doing  okay, he had improvement in his blood first specimen on the left in facial dyskinesias. He had been followed at [facility]. He was started on amantadine and was taking it twice daily. He was off of Stalevo completely. He felt that his abdominal cramping and facial dyskinesias improved after he came off of Stalevo altogether. He was in the process of being scheduled for Botox injections for his blepharospasm at [facility]. He was still on once daily long-acting Requip, when he tried to stop it completely he noticed a flareup of his RLS. He denied any recent falls. He was exercising regularly. He did have some constipation but generally under control with some regularity in his bowel movements reported.    I saw him on 07/09/2015, at which time he reported no recent syncope. He had a tilt table test in January 2017 which showed abnormal findings but minimal symptoms reported. He saw Dr. Hartford Poli with vascular surgery for consultation on 05/28/2015. He had a 24-hour blood pressure monitor. He had a follow-up appointment pending with his vascular specialist. He also had an appointment pending with [name] in October 2017. He felt he pulled a muscle in his upper back on the right. He was on Stalevo about 4 times a day, sometimes 5 times a day depending on his day to day activities. He was on long-acting Requip 6 mg once daily. He was sleeping a little better. He had been advised to start using compression stockings but had not started yet. He had noticed worsening of droopy eyelid. He mailed in December 2017 Re: Blepharospasm and treatment for this. He had been seeing an ophthalmologist.   I saw him on 03/11/2015, at which time he reported a recent fall in September 2016. He had just come back from a road trip and after being in the car for about 2-1/2 hours he got out of the car and fell to the ground for his wife even realize what happened. He fell on pavement and chipped his tooth. They were with some friends  who helped. He had to have a crown placed over his chipped tooth. His DBS settings were adjusted when he had his follow-up at [facility]. His orthostatic blood pressure values were stable. Since his settings were changed he had no other syncopal spell. He did have some medication changes as well. He was no longer on clonazepam. Stalevo was 100 mg strength 4-5 times a day. Requip long-acting was 6 mg once daily. She was on long-acting melatonin 6 mg each night. He was advised to drink 4 bottles of water. He was scheduled for tilt table test as well. His wife was concerned about him climbing on ladders. We talked about gait safety and syncope risk. He was advised not to climb on  any ladders or work at heights.    I saw him on 11/07/2014, at which time he reported doing reasonably well. He had nasal congestion after his fall and injury. Of note, unfortunately, he had a syncopal spell on 10/29/2014 and hit his nose as he fell and sustained a nasal fracture. I reviewed the emergency room records. He was referred to ENT. His laceration was repaired with Dermabond. He reported that he stood up and felt lightheaded and then passed out. He fell and hit his face. He had a CT head without contrast as well as maxillofacial CT without contrast on 10/29/2014 which showed: RIGHT and LEFT nasal bone fractures. Small displaced fracture fragment on the LEFT. No skull fracture or intracranial hemorrhage. Unremarkable appearing deep brain stimulator leads. He fell at work. He had taken Levitra at night and 2 days prior he was working hard in the yard. He is notoriously not drinking enough water he admits. He had taken his Stalevo in the morning and was due for his next dose and became lightheaded when he got up quickly from his desk. He had been sitting for about 45 minutes consistently at his desk. He did have a warning sign and kept walking instead of sitting down again. He had a follow-up appointment at [facility] on  11/02/2014 and his Requip was increased slightly again. He had re-programming of his DBS as well. He had reduced the Requip XL to 3 mg twice daily but this was increased to 4 mg twice daily. His Stalevo was at 4-6 pills daily depending on his work day. He does get sleepy at night around 9:30 and often falls asleep while watching TV. He tried Melatonin long-acting but in combination with clonazepam at night it did not work well and in fact he had insomnia. He tried to reduce his clonazepam to 0.25 mg each night but increased it back to 0.5 mg each night. For his nose fracture he saw Dr. Redmond Baseman and was scheduled for surgery. I did not change his medications. I asked him to drink more water and reminded him to change positions slowly.   I saw him on 08/07/2014, at which time he reported doing quite well after his DBS placement. His wife felt that he was doing great. He was eating well. He was not drinking enough water. He had trace edema in his distal lower extremities. He had 2 appointments for DBS programming. His Stalevo was reduced from 6 pills a day to 4 pills a day but then he increased it back to 5 pills a day. He was off generic Sinemet. He was on Azilect once daily and Requip long-acting 8 mg twice daily.    He's been back to work. He is currently on Azilect once daily, Requip XL 8 mg twice daily, Stalevo 100 mg strength one pill 5 times a day and he has stopped the carbidopa-levodopa. I reviewed his outside records from Dr. Salomon Fick at [facility] neurosurgery. Patient had bilateral STN DBS electrodes placed on 06/01/2014. He had pulse generators placed approximately 3 weeks after that, 06/13/14.   I saw him on 02/01/2014, at which time he reported difficulty staying asleep. He had not consistently tried melatonin. He had problems with urinary urgency and was advised to try Kegel exercises by his urologist. His PCP talked to an infectious diseases specialist and it was  determined he did not actually have Lyme disease.    He did not show for an appointment on 01/30/2014 -  he genuinely forgot!   I saw him on 12/19/2013, at which time he reported that his medication was not working as well. He reported more freezing episodes and more off time. Of note, he had been using a new protein milkshake that he was drinking first thing in the morning. He had lost some weight and in an effort to improve his weight loss he had started this new supplements. His primary care physician had referred him to a nutritionist. We also talked about DBS evaluation and he requested to see another specialist for DBS candidacy. I referred him to [name] at [facility] I also advised him to discontinue using the new protein supplement as I felt it may be interfering with the absorption of his Stalevo. I also suggested a trial of Parcopa half a pill 3 times a day at 11, 2 PM and 7 PM and we kept the Stalevo the same. He was advised to use MiraLAX as needed for constipation.   I saw him on 07/31/13, at which time [name], including swimming, yoga, and weights. He has been on Stalevo 100 mg every 2 1/2 hours starting at 7:30 AM for 6 doses. He stopped clonazepam, as he felt some daytime sluggishness. He has been taking C/L 1/2 tid in between. He had no cognitive issues, but did have some "downtime" midday. I did not change his medications. He had an appointment with [name] at [facility] in June, but there was a mix up with his appointment and he does not have an appointment until next year.   In the interim, he was diagnosed with Lyme d/s in July. He was tested and treated by his PCP has has been referred to an infectious d/s doctor. He brings in his latest lying test results from 12/05/2013 which I reviewed: It looks like his IgM antibody status is negative but his IgG antibody status has not quite turn positive as far as I understand the report.  That means he is probably in the subacute or early chronic phase if I interpret this correctly. He requested a sooner than scheduled appointment, due to feeling more freezing and more off time. This starts in the late morning hours but can also go into the afternoon after lunch. Cognitively and mood wise he feels stable. He does not sleep well at night. He has not consistently tried melatonin I recommended.   I saw him on 01/30/2013, at which time I felt he was stable. We talked about DBS surgery. I did not make any medication changes. He is aware of the increase risk of melanoma associated with DAs and his dermatologist is aware that he is taking Requip XL.   I first met him on 09/27/2012, which time I did not make any changes to his medication and did not order any new test as he was stable. He saw [name] at [facility] and discussed DBS in the past but was not deemed a cadidate for it at the time as he was doing well. He was told by [name] to stay on schedule with his medication. 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 Sleep has been an issue. He goes to sleep well, but wakes up after 45 minutes to 1 hour. He is seeing a urologist. He exercises regularly.   He previously followed with [name] and was last seen by him on 05/25/2012, at which  time [name] felt he was stable, but because of off time at 2 hours he increased his carbidopa-levodopa by adding half of a 10-100 mg strength 3 times a day to his regimen. He recommended decreasing his Stalevo to 125 mg from 100 mg 6 times a day. He has been on Stalevo 125 mg 6 times a day, at 7:30, 10, 12:30, 3 PM, 5:30 PM and 8 PM. He is on clonazepam 0.5 mg at night, Requip long-acting 6 mg twice daily, rasagiline 1 mg once daily, carbidopa-levodopa 10-100 mg strength half a tablet 3 times a day, 7:30 to 10, then midday and later in the evening.   He was followed by [name] since 2008 with a history of Parkinson's disease. He has a  history of increased tone and slowness as well as tremor with R sided predominance. He was initially placed on Requip XL and then rasagiline was added in October 2008. In February 2010 Stalevo was added. He had low vitamin D levels in 2008. ANA, ESR, ceruloplasmin were normal except for slightly low ceruloplasmin of 16. Urine copper test was negative. Methylmalonic acid was normal. Urine for heavy metals is normal as well. MRI brain with and without contrast on 01/07/2007 was normal. He had a sleep study in October 2008 showing PLMs and evidence of RBD. Repeat sleep study in September 2011 showed supine obstructive sleep apnea and RBD. He had a CPAP titration study in late September 2011 which was not very successful. He has been on clonazepam at night. He has not noticed any compulsive behaviors, memory loss, depression, swelling, or orthostatic dizziness. He recently had a skin biopsy showing atypical cells, concerning for melanoma.   He works full-time as an Forensic psychologist, [employer]. He exercises regularly and does yoga.     His Past Medical History Is Significant For: Past Medical History:  Diagnosis Date  . BPH (benign prostatic hyperplasia)   . Parkinson disease ([place])   . Skin cancer of face    arms and back  . Sleep apnea    mild  . Spider bite    brown recluse/ left ankle/08/13    His Past Surgical History Is Significant For: Past Surgical History:  Procedure Laterality Date  . BURR HOLE W/ STEREOTACTIC INSERTION OF DBS LEADS / INTRAOP MICROELECTRODE RECORDING  04/2014  . COLONOSCOPY    . KNEE SURGERY     exploratory/left  . SKIN CANCER EXCISION  01/2014   face  . TRANSURETHRAL RESECTION OF PROSTATE     2008    His Family History Is Significant For: Family History  Problem Relation Age of Onset  . COPD Mother   . Cancer Father        Bladder  . Colon cancer Neg Hx   . Esophageal cancer Neg Hx   . Stomach cancer Neg Hx     His Social History Is Significant  For: Social History   Socioeconomic History  . Marital status: Married    Spouse name: Levada Dy  . Number of children: 4  . Years of education: 9  . Highest education level: Not on file  Occupational History  . Occupation: LAWYER    Employer: [employer]  Tobacco Use  . Smoking status: Never Smoker  . Smokeless tobacco: Never Used  Substance and Sexual Activity  . Alcohol use: Yes    Alcohol/week: 5.0 standard drinks    Types: 3 Glasses of wine, 2 Cans of beer per week  Comment: occasional  . Drug use: No  . Sexual activity: Not on file  Other Topics Concern  . Not on file  Social History Narrative   Patient is right handed and resides with wife   1-2 cups of coffee a day    Social Determinants of Health   Financial Resource Strain:   . Difficulty of Paying Living Expenses: Not on file  Food Insecurity:   . Worried About Charity fundraiser in the Last Year: Not on file  . Ran Out of Food in the Last Year: Not on file  Transportation Needs:   . Lack of Transportation (Medical): Not on file  . Lack of Transportation (Non-Medical): Not on file  Physical Activity:   . Days of Exercise per Week: Not on file  . Minutes of Exercise per Session: Not on file  Stress:   . Feeling of Stress : Not on file  Social Connections:   . Frequency of Communication with Friends and Family: Not on file  . Frequency of Social Gatherings with Friends and Family: Not on file  . Attends Religious Services: Not on file  . Active Member of Clubs or Organizations: Not on file  . Attends Archivist Meetings: Not on file  . Marital Status: Not on file    His Allergies Are:  No Known Allergies:   His Current Medications Are:  Outpatient Encounter Medications as of 05/03/2019  Medication Sig  . amantadine (SYMMETREL) 100 MG capsule   . carbidopa-levodopa (SINEMET IR) 25-100 MG tablet Take 1.5 tablets by mouth 5 (five) times daily.  . clonazePAM (KLONOPIN) 0.25 MG  disintegrating tablet Take 1 tablet (0.25 mg total) by mouth at bedtime.  . Ropinirole HCl 6 MG TB24 2 (two) times daily.   . [DISCONTINUED] clonazePAM (KLONOPIN) 0.25 MG disintegrating tablet Take 1 tablet (0.25 mg total) by mouth at bedtime.  . [DISCONTINUED] MELATONIN PO Take by mouth.  . [DISCONTINUED] Probiotic Product (PROBIOTIC PO) Take by mouth.  . [DISCONTINUED] VITAMIN D PO Take by mouth.   Facility-Administered Encounter Medications as of 05/03/2019  Medication  . 0.9 %  sodium chloride infusion  :  Review of Systems:  Out of a complete 14 point review of systems, all are reviewed and negative with the exception of these symptoms as listed below: Review of Systems  Neurological:       Reports he has been doing well since last visit. 1 fall to report that happened back in the summer when he was working in the yard.     Objective:  Neurological Exam  Physical Exam Physical Examination:   Vitals:   05/03/19 1526  BP: 134/81  Pulse: 61  Temp: (!) 96.9 F (36.1 C)   General Examination: The patient is a very pleasant 74 y.o. male in no acute distress. He appears well-developed and well-nourished and well groomed.   HEENT:Normocephalic, atraumatic, pupils are equal, round and reactive to light and accommodation. Extraocular tracking shows mild saccadic breakdown without nystagmus noted. There is mildlimitation toupgaze, and no significant blepharospasmof the left eye.Left eyelid lower than right, he does have intermittent facial involuntary grimacing. Neck is moderately rigidwith neck tilt to the R, oropharynx exam reveals mild mouth dryness. No significant airway crowding is noted. Mallampati is class II. Tongue protrudes centrally and palate elevates symmetrically. There is no drooling. There are no dyskinesias. His stimulator wires are tunneled on the left side.   Chest:is clear to auscultation without wheezing, rhonchi or crackles noted.  His generator site is not  tender.   Heart:sounds are regular and normal without murmurs, rubs or gallops noted.   Abdomen:is soft, non-tender and non-distended with normal bowel sounds appreciated on auscultation.  Extremities:There isno obvious edema in the distal lower extremities bilaterally.  Skin: is warm and dry with no trophic changes noted. Age-related changes are noted on the skin and scars from skin cancer removals.   Musculoskeletal: exam reveals no obvious joint deformities, tenderness, joint swelling or erythema, with the exception of low back pain reported and right shoulder pain.Upper body tilt to R, better. Reduced ROM R knee.  Neurologically:  Mental status: The patient is awake and alert, paying good attention. He is able to completely provide the history. He is oriented to: person, place, time/date, situation, day of week, month of year and year. His memory, attention, language and knowledge areappropriate.There is no aphasia, agnosia, apraxia or anomia. There is amilddegree of bradyphrenia. Speech is mild to moderately hypophonic withminimal dysarthria noted. Mood is congruent and affect is normal.   Cranial nerves are as described above under HEENT exam. In addition, shoulder shrug is normal with equal shoulder height noted.  Motor exam: Normal bulk, and strength for age is noted. There are no dyskinesias noted today.No resting tremor. Tone is mild to moderately rigid with presence of cogwheeling in the right upper extremity. There is overall mild to moderate bradykinesia. There is no drift or rebound.  There in anintermittent myoclonus in theupper body. Romberg is negative, with the exception of mild sway.  Fine motor skills exam: Fine motor skills are mild to moderately impaired, more pronounced on the R than L,butstable.  Cerebellar testing shows no dysmetria or intention tremor. There is no truncal or gait ataxia.  Sensory exam is intact to light touch throughout.   Gait, station and balance: He stands up from the seated position with no significant difficulty and does not need to push up with His hands. He needs no assistance. No veering to one side is noted. He isnoted to lean to the right more and postureis mildlystooped. He walks withdecreased arm swing bilaterally. Mild dystonic posturing of the right arm. Balance is mildly impaired.   Assessment and Plan:   In summary, Tra Wilemon is a very pleasant27 year old male with an underlying medical history of skin cancer including melanoma, enlarged prostate (s/p TURP), who presents for followup consultation of his R sided predominant Parkinson's disease, complicated by RBD and sleep disturbance as well as mild dyskinesias. He is s/p bilateral DBS placements at [facility], s/p L IPG replacement on the L in 09/2018. He is On Sinemet, Requip long-acting and amantadine. He has had orthostatic hypotension, syncopal spells, fallswith injuries in the past, constipation and bladder hyperactivity,and nows/p b/l DBS placement at [facility] on 06/01/14 and generator placement on 06/13/2014. Left-sided battery replacement/generator replacement in June 2020. Overall, he has done well,has had some interim issues withlow back pain,andarthritis in his right shoulder, somesleep disturbance. And more recently, he had right knee pain. He did have 2 distinct syncopal spells that led to collapse, falls, and injuries.He sustained Fx of nose when he fell in his office, and then also fell in his driveway and chipped a tooth, needed a crown placed. He hadto have nose surgery. He saw a vascular specialist and had a tilt table test at [facility]. He had a24-hour blood pressure monitor test. Hewas started on amantadine for dyskinesias.He had a one month trial of Gocovri, but a Rx was cost prohibitiveand  he went back to amantadine twice a day. He tried to reduce it to once daily but found it not as effective.He isgetting  regularBotox injection for blepharospasmat [facility].He used to be on Stalevo in the past but was able to stop this in or around early 2018. I suggested hecontinue with the current regimen. He also takes clonazepam namely 0.25 mg at night for his REM behavior disorder and nighttime sleep disruption.He tried melatonin.He is advised to continue with Sinemet at the current dose, 1-1/2 pills 5 times a dayand Requip6 mg every evening. He has not had any recent falls thankfully.  He had a fall in the summer and hurt his right hip, had some subsequent issues with the right knee, overall has been doing better, he had received a epidural steroid injection into the back and had physical therapy for the right knee, does not use a cane any longer.  He does not have any recent issues with constipation thankfully.  He continues to take low-dose clonazepam, no longer on melatonin. I suggested a 6 month follow-up, sooner as needed.I answered allhis questions today and the patient and his wife were in agreement. I spent 30 minutes in total face-to-face time and in reviewing records during pre-charting, more than 50% of which was spent in counseling and coordination of care, reviewing test results, reviewing medication and discussing or reviewing the diagnosis of PD, the prognosis and treatment options. Pertinent laboratory and imaging test results that were available during this visit with the patient were reviewed by me and considered in my medical decision making (see chart for details).

## 2019-05-19 DIAGNOSIS — Z125 Encounter for screening for malignant neoplasm of prostate: Secondary | ICD-10-CM | POA: Diagnosis not present

## 2019-05-19 DIAGNOSIS — R82998 Other abnormal findings in urine: Secondary | ICD-10-CM | POA: Diagnosis not present

## 2019-05-19 DIAGNOSIS — M859 Disorder of bone density and structure, unspecified: Secondary | ICD-10-CM | POA: Diagnosis not present

## 2019-05-19 DIAGNOSIS — E7849 Other hyperlipidemia: Secondary | ICD-10-CM | POA: Diagnosis not present

## 2019-05-20 ENCOUNTER — Ambulatory Visit: Payer: Medicare Other | Attending: Internal Medicine

## 2019-05-20 DIAGNOSIS — Z23 Encounter for immunization: Secondary | ICD-10-CM | POA: Insufficient documentation

## 2019-05-20 NOTE — Progress Notes (Signed)
   Covid-19 Vaccination Clinic  Name:  [name]    MRN: [id number] DOB: 1945-10-05  05/20/2019  Mr. East was observed post Covid-19 immunization for 15 minutes without incidence. He was provided with Vaccine Information Sheet and instruction to access the V-Safe system.   [name] was instructed to call 911 with any severe reactions post vaccine: Marland Kitchen Difficulty breathing  . Swelling of your face and throat  . A fast heartbeat  . A bad rash all over your body  . Dizziness and weakness    Immunizations Administered    Name Date Dose VIS Date Route   Pfizer COVID-19 Vaccine 05/20/2019 11:53 AM 0.3 mL 03/17/2019 Intramuscular   Manufacturer: [place]   Lot: [id number]   [place]: [id number]

## 2019-05-24 DIAGNOSIS — Z1212 Encounter for screening for malignant neoplasm of rectum: Secondary | ICD-10-CM | POA: Diagnosis not present

## 2019-05-26 DIAGNOSIS — G20 Parkinson's disease: Secondary | ICD-10-CM | POA: Diagnosis not present

## 2019-05-26 DIAGNOSIS — H9190 Unspecified hearing loss, unspecified ear: Secondary | ICD-10-CM | POA: Diagnosis not present

## 2019-05-26 DIAGNOSIS — R5383 Other fatigue: Secondary | ICD-10-CM | POA: Diagnosis not present

## 2019-05-26 DIAGNOSIS — G4733 Obstructive sleep apnea (adult) (pediatric): Secondary | ICD-10-CM | POA: Diagnosis not present

## 2019-05-26 DIAGNOSIS — Z1331 Encounter for screening for depression: Secondary | ICD-10-CM | POA: Diagnosis not present

## 2019-05-26 DIAGNOSIS — C801 Malignant (primary) neoplasm, unspecified: Secondary | ICD-10-CM | POA: Diagnosis not present

## 2019-05-26 DIAGNOSIS — Z0000 Encounter for general adult medical examination without abnormal findings: Secondary | ICD-10-CM | POA: Diagnosis not present

## 2019-05-26 DIAGNOSIS — M8589 Other specified disorders of bone density and structure, multiple sites: Secondary | ICD-10-CM | POA: Diagnosis not present

## 2019-05-26 DIAGNOSIS — R634 Abnormal weight loss: Secondary | ICD-10-CM | POA: Diagnosis not present

## 2019-05-26 DIAGNOSIS — N401 Enlarged prostate with lower urinary tract symptoms: Secondary | ICD-10-CM | POA: Diagnosis not present

## 2019-05-26 DIAGNOSIS — N529 Male erectile dysfunction, unspecified: Secondary | ICD-10-CM | POA: Diagnosis not present

## 2019-05-26 DIAGNOSIS — A6920 Lyme disease, unspecified: Secondary | ICD-10-CM | POA: Diagnosis not present

## 2019-05-26 DIAGNOSIS — R269 Unspecified abnormalities of gait and mobility: Secondary | ICD-10-CM | POA: Diagnosis not present

## 2019-05-27 ENCOUNTER — Ambulatory Visit: Payer: Medicare Other

## 2019-06-07 DIAGNOSIS — N401 Enlarged prostate with lower urinary tract symptoms: Secondary | ICD-10-CM | POA: Diagnosis not present

## 2019-06-07 DIAGNOSIS — N35011 Post-traumatic bulbous urethral stricture: Secondary | ICD-10-CM | POA: Diagnosis not present

## 2019-06-07 DIAGNOSIS — R350 Frequency of micturition: Secondary | ICD-10-CM | POA: Diagnosis not present

## 2019-06-07 DIAGNOSIS — R972 Elevated prostate specific antigen [PSA]: Secondary | ICD-10-CM | POA: Diagnosis not present

## 2019-06-07 DIAGNOSIS — N5201 Erectile dysfunction due to arterial insufficiency: Secondary | ICD-10-CM | POA: Diagnosis not present

## 2019-06-09 ENCOUNTER — Other Ambulatory Visit: Payer: Self-pay | Admitting: Urology

## 2019-06-09 ENCOUNTER — Encounter (HOSPITAL_BASED_OUTPATIENT_CLINIC_OR_DEPARTMENT_OTHER): Payer: Self-pay | Admitting: Urology

## 2019-06-09 ENCOUNTER — Other Ambulatory Visit: Payer: Self-pay

## 2019-06-09 NOTE — Progress Notes (Signed)
Spoke w/ via phone for pre-op interview---[facility] needs dos---- I stat 8             Lab results------ COVID test ------06-13-2019 Arrive at -------930 am 06-15-2019 NPO after ------midnight Medications to take morning of surgery -----amantadine, carbidope-levodopa, ropiniole Diabetic medication -----n/a Patient Special Instructions -----bring remote for deep brain stimulator Pre-Op special Istructions ----- Patient verbalized understanding of instructions that were given at this phone interview. Patient denies shortness of breath, chest pain, fever, cough a this phone interview.  Patient has ring stuck on left ring finger cannot remove ring for years.  Anesthesia : deep brain burr hole with stimulator for parkinson's  PCP: dr Elta Guadeloupe Renie Ora neurology dr Rexene Alberts 05-03-2019 epic, lov jenn[facility] 09-28-2019 [facility] Cardiologist :none Chest x-ray :none EKG :none Echo :none Cardiac Cath : none Sleep Study/ CPAP : sleep study 8 yrs ago mild sleep apnea no cpap needed Fasting Blood Sugar :      / Checks Blood Sugar -- times a day:  n/a Blood Thinner/ Instructions /Last Dose:n/a ASA / Instructions/ Last Dose : n/a  Patient denies shortness of breath, chest pain, fever, and cough at this phone interview.

## 2019-06-12 ENCOUNTER — Other Ambulatory Visit (HOSPITAL_COMMUNITY): Payer: Medicare Other

## 2019-06-13 ENCOUNTER — Other Ambulatory Visit (HOSPITAL_COMMUNITY)
Admission: RE | Admit: 2019-06-13 | Discharge: 2019-06-13 | Disposition: A | Discharge status: Home / Self Care | Payer: Medicare Other | Source: Ambulatory Visit | Attending: Urology | Admitting: Urology

## 2019-06-13 DIAGNOSIS — Z01812 Encounter for preprocedural laboratory examination: Secondary | ICD-10-CM | POA: Insufficient documentation

## 2019-06-13 DIAGNOSIS — Z20822 Contact with and (suspected) exposure to covid-19: Secondary | ICD-10-CM | POA: Diagnosis not present

## 2019-06-13 NOTE — Progress Notes (Signed)
ADDENDUM to progress note by Zelphia Cairo RN dated 06-09-2019.  Chart reviewed by anesthesia, Konrad Felix PA, ok to proceed.

## 2019-06-14 LAB — SARS CORONAVIRUS 2 (TAT 6-24 HRS): SARS Coronavirus 2: NEGATIVE

## 2019-06-14 NOTE — H&P (Addendum)
H&P  Chief Complaint: Difficulty urinating  History of Present Illness: 74 yo male w/ h/o Parkinson's dz, years out from TURP (2008), presents for cysto/baloon dilation of recurrent urethral stricture.  Past Medical History:  Diagnosis Date  . BPH (benign prostatic hyperplasia)   . Parkinson disease ([place])   . Skin cancer of face    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  . Sleep apnea    mild  . Spider bite    brown recluse/ left ankle/08/13  . Urethral stricture     Past Surgical History:  Procedure Laterality Date  . BURR HOLE W/ STEREOTACTIC INSERTION OF DBS LEADS / INTRAOP MICROELECTRODE RECORDING  04/2014  . COLONOSCOPY    . KNEE SURGERY     exploratory/left  . SKIN CANCER EXCISION  01/2014   face  . TRANSURETHRAL RESECTION OF PROSTATE     2008    Home Medications:  Allergies as of 06/14/2019   No Known Allergies     Medication List    Notice   Cannot display discharge medications because the patient has not yet been admitted.     Allergies: No Known Allergies  Family History  Problem Relation Age of Onset  . COPD Mother   . Cancer Father        Bladder  . Colon cancer Neg Hx   . Esophageal cancer Neg Hx   . Stomach cancer Neg Hx     Social History:  reports that he has never smoked. He has never used smokeless tobacco. He reports current alcohol use of about 5.0 standard drinks of alcohol per week. He reports that he does not use drugs.  ROS: A complete review of systems was performed.  All systems are negative except for pertinent findings as noted.  Physical Exam:  Vital signs in last 24 hours: Ht 5\' 11"  (1.803 m)   Wt 70.3 kg   BMI 21.62 kg/m  Constitutional:  Alert and oriented, No acute distress Cardiovascular: Regular rate  Respiratory: Normal respiratory effort GI: Abdomen is soft, nontender, nondistended, no abdominal masses. No CVAT.  Genitourinary: Normal male phallus, testes are descended bilaterally and non-tender and  without masses, scrotum is normal in appearance without lesions or masses, perineum is normal on inspection. Lymphatic: No lymphadenopathy Neurologic: Grossly intact, no focal deficits Psychiatric: Normal mood and affect  Laboratory Data:  No results for input(s): WBC, HGB, HCT, PLT in the last 72 hours.  No results for input(s): NA, K, CL, GLUCOSE, BUN, CALCIUM, CREATININE in the last 72 hours.  Invalid input(s): CO3   No results found for this or any previous visit (from the past 24 hour(s)). Recent Results (from the past 240 hour(s))  SARS CORONAVIRUS 2 (TAT 6-24 HRS) Nasopharyngeal Nasopharyngeal Swab     Status: None   Collection Time: 06/13/19  2:47 PM   Specimen: Nasopharyngeal Swab  Result Value Ref Range Status   SARS Coronavirus 2 NEGATIVE NEGATIVE Final    Comment: (NOTE) SARS-CoV-2 target nucleic acids are NOT DETECTED. The SARS-CoV-2 RNA is generally detectable in upper and lower respiratory specimens during the acute phase of infection. Negative results do not preclude SARS-CoV-2 infection, do not rule out co-infections with other pathogens, and should not be used as the sole basis for treatment or other patient management decisions. Negative results must be combined with clinical observations, patient history, and epidemiological information. The expected result is Negative. Fact Sheet for Patients: SugarRoll.be Fact Sheet for Healthcare Providers: https://www.woods-mathews.com/ This test is  not yet approved or cleared by the [place] and  has been authorized for detection and/or diagnosis of SARS-CoV-2 by FDA under an Emergency Use Authorization (EUA). This EUA will remain  in effect (meaning this test can be used) for the duration of the COVID-19 declaration under Section 56 4(b)(1) of the Act, 21 U.S.C. section 360bbb-3(b)(1), unless the authorization is terminated or revoked sooner. Performed at [facility], [place] [address], [place], [place] [address]     Renal Function: No results for input(s): CREATININE in the last 168 hours. CrCl cannot be calculated (Patient's most recent lab result is older than the maximum 21 days allowed.).  Radiologic Imaging: No results found.  Impression/Assessment:  Recurrent urethral stricture  Plan:  Cysto, urethral dilation

## 2019-06-15 ENCOUNTER — Ambulatory Visit (HOSPITAL_BASED_OUTPATIENT_CLINIC_OR_DEPARTMENT_OTHER): Payer: Medicare Other | Admitting: Physician Assistant

## 2019-06-15 ENCOUNTER — Encounter (HOSPITAL_BASED_OUTPATIENT_CLINIC_OR_DEPARTMENT_OTHER): Payer: Self-pay | Admitting: Urology

## 2019-06-15 ENCOUNTER — Telehealth: Payer: Self-pay | Admitting: Neurology

## 2019-06-15 ENCOUNTER — Ambulatory Visit (HOSPITAL_BASED_OUTPATIENT_CLINIC_OR_DEPARTMENT_OTHER)
Admission: RE | Admit: 2019-06-15 | Discharge: 2019-06-15 | Disposition: A | Discharge status: Home / Self Care | Payer: Medicare Other | Attending: Urology | Admitting: Urology

## 2019-06-15 ENCOUNTER — Encounter (HOSPITAL_BASED_OUTPATIENT_CLINIC_OR_DEPARTMENT_OTHER)
Admission: RE | Disposition: A | Discharge status: Home / Self Care | Payer: Self-pay | Source: Home / Self Care | Attending: Urology

## 2019-06-15 DIAGNOSIS — N135 Crossing vessel and stricture of ureter without hydronephrosis: Secondary | ICD-10-CM | POA: Diagnosis not present

## 2019-06-15 DIAGNOSIS — Z85828 Personal history of other malignant neoplasm of skin: Secondary | ICD-10-CM | POA: Insufficient documentation

## 2019-06-15 DIAGNOSIS — G4730 Sleep apnea, unspecified: Secondary | ICD-10-CM | POA: Insufficient documentation

## 2019-06-15 DIAGNOSIS — N35919 Unspecified urethral stricture, male, unspecified site: Secondary | ICD-10-CM | POA: Insufficient documentation

## 2019-06-15 DIAGNOSIS — G20 Parkinson's disease: Secondary | ICD-10-CM

## 2019-06-15 DIAGNOSIS — N400 Enlarged prostate without lower urinary tract symptoms: Secondary | ICD-10-CM | POA: Diagnosis not present

## 2019-06-15 DIAGNOSIS — M1991 Primary osteoarthritis, unspecified site: Secondary | ICD-10-CM | POA: Diagnosis not present

## 2019-06-15 DIAGNOSIS — N35011 Post-traumatic bulbous urethral stricture: Secondary | ICD-10-CM | POA: Diagnosis not present

## 2019-06-15 HISTORY — DX: Unspecified urethral stricture, male, unspecified site: N35.919

## 2019-06-15 HISTORY — PX: CYSTOSCOPY WITH URETHRAL DILATATION: SHX5125

## 2019-06-15 LAB — POCT I-STAT, CHEM 8
BUN: 24 mg/dL — ABNORMAL HIGH (ref 8–23)
Calcium, Ion: 1.21 mmol/L (ref 1.15–1.40)
Chloride: 101 mmol/L (ref 98–111)
Creatinine, Ser: 0.9 mg/dL (ref 0.61–1.24)
Glucose, Bld: 84 mg/dL (ref 70–99)
HCT: 49 % (ref 39.0–52.0)
Hemoglobin: 16.7 g/dL (ref 13.0–17.0)
Potassium: 4.3 mmol/L (ref 3.5–5.1)
Sodium: 141 mmol/L (ref 135–145)
TCO2: 30 mmol/L (ref 22–32)

## 2019-06-15 SURGERY — CYSTOSCOPY, WITH URETHRAL DILATION
Anesthesia: General | Site: Urethra

## 2019-06-15 MED ORDER — ONDANSETRON HCL 4 MG/2ML IJ SOLN
INTRAMUSCULAR | Status: DC | PRN
  Administered 2019-06-15: 4 mg via INTRAVENOUS

## 2019-06-15 MED ORDER — MEPERIDINE HCL 25 MG/ML IJ SOLN
6.2500 mg | INTRAMUSCULAR | Status: DC | PRN
  Filled 2019-06-15: qty 1

## 2019-06-15 MED ORDER — CEFAZOLIN SODIUM-DEXTROSE 2-4 GM/100ML-% IV SOLN
2.0000 g | INTRAVENOUS | Status: AC
  Administered 2019-06-15: 2 g via INTRAVENOUS
  Filled 2019-06-15: qty 100

## 2019-06-15 MED ORDER — MIDAZOLAM HCL 2 MG/2ML IJ SOLN
0.5000 mg | Freq: Once | INTRAMUSCULAR | Status: DC | PRN
  Filled 2019-06-15: qty 2

## 2019-06-15 MED ORDER — FENTANYL CITRATE (PF) 100 MCG/2ML IJ SOLN
INTRAMUSCULAR | Status: AC
  Filled 2019-06-15: qty 2

## 2019-06-15 MED ORDER — LACTATED RINGERS IV SOLN
INTRAVENOUS | Status: DC
  Filled 2019-06-15: qty 1000

## 2019-06-15 MED ORDER — LIDOCAINE 2% (20 MG/ML) 5 ML SYRINGE
INTRAMUSCULAR | Status: DC | PRN
  Administered 2019-06-15: 80 mg via INTRAVENOUS

## 2019-06-15 MED ORDER — PROPOFOL 10 MG/ML IV BOLUS
INTRAVENOUS | Status: DC | PRN
  Administered 2019-06-15: 180 mg via INTRAVENOUS

## 2019-06-15 MED ORDER — FENTANYL CITRATE (PF) 100 MCG/2ML IJ SOLN
25.0000 ug | INTRAMUSCULAR | Status: DC | PRN
  Filled 2019-06-15: qty 1

## 2019-06-15 MED ORDER — PROPOFOL 10 MG/ML IV BOLUS
INTRAVENOUS | Status: AC
  Filled 2019-06-15: qty 20

## 2019-06-15 MED ORDER — PROMETHAZINE HCL 25 MG/ML IJ SOLN
6.2500 mg | INTRAMUSCULAR | Status: DC | PRN
  Filled 2019-06-15: qty 1

## 2019-06-15 MED ORDER — LIDOCAINE 2% (20 MG/ML) 5 ML SYRINGE
INTRAMUSCULAR | Status: AC
  Filled 2019-06-15: qty 5

## 2019-06-15 MED ORDER — DEXAMETHASONE SODIUM PHOSPHATE 10 MG/ML IJ SOLN
INTRAMUSCULAR | Status: AC
  Filled 2019-06-15: qty 1

## 2019-06-15 MED ORDER — CEFAZOLIN SODIUM-DEXTROSE 2-4 GM/100ML-% IV SOLN
INTRAVENOUS | Status: AC
  Filled 2019-06-15: qty 100

## 2019-06-15 MED ORDER — SODIUM CHLORIDE 0.9 % IR SOLN
Status: DC | PRN
  Administered 2019-06-15: 200 mL via INTRAVESICAL

## 2019-06-15 MED ORDER — DEXAMETHASONE SODIUM PHOSPHATE 10 MG/ML IJ SOLN
INTRAMUSCULAR | Status: DC | PRN
  Administered 2019-06-15: 10 mg via INTRAVENOUS

## 2019-06-15 MED ORDER — ONDANSETRON HCL 4 MG/2ML IJ SOLN
INTRAMUSCULAR | Status: AC
  Filled 2019-06-15: qty 2

## 2019-06-15 MED ORDER — FENTANYL CITRATE (PF) 100 MCG/2ML IJ SOLN
INTRAMUSCULAR | Status: DC | PRN
  Administered 2019-06-15: 50 ug via INTRAVENOUS
  Administered 2019-06-15 (×2): 25 ug via INTRAVENOUS

## 2019-06-15 SURGICAL SUPPLY — 25 items
BAG DRAIN URO-CYSTO SKYTR STRL (DRAIN) ×3 IMPLANT
BAG DRN UROCATH (DRAIN) ×1
BAG URINE LEG 500ML (DRAIN) IMPLANT
BALLN NEPHROSTOMY (BALLOONS) ×3
BALLOON NEPHROSTOMY (BALLOONS) IMPLANT
CATH FOLEY 2WAY SLVR  5CC 20FR (CATHETERS)
CATH FOLEY 2WAY SLVR  5CC 22FR (CATHETERS)
CATH FOLEY 2WAY SLVR 5CC 20FR (CATHETERS) IMPLANT
CATH FOLEY 2WAY SLVR 5CC 22FR (CATHETERS) IMPLANT
CATH INTERMIT  6FR 70CM (CATHETERS) IMPLANT
CLOTH BEACON ORANGE TIMEOUT ST (SAFETY) ×2 IMPLANT
GLOVE BIO SURGEON STRL SZ8 (GLOVE) ×3 IMPLANT
GOWN STRL REUS W/ TWL LRG LVL3 (GOWN DISPOSABLE) ×1 IMPLANT
GOWN STRL REUS W/ TWL XL LVL3 (GOWN DISPOSABLE) ×1 IMPLANT
GOWN STRL REUS W/TWL LRG LVL3 (GOWN DISPOSABLE) ×3
GOWN STRL REUS W/TWL XL LVL3 (GOWN DISPOSABLE) ×3
GUIDEWIRE ANG ZIPWIRE 038X150 (WIRE) IMPLANT
GUIDEWIRE STR DUAL SENSOR (WIRE) ×2 IMPLANT
KIT TURNOVER CYSTO (KITS) ×3 IMPLANT
MANIFOLD NEPTUNE II (INSTRUMENTS) ×3 IMPLANT
NS IRRIG 500ML POUR BTL (IV SOLUTION) ×2 IMPLANT
PACK CYSTO (CUSTOM PROCEDURE TRAY) ×3 IMPLANT
TUBE CONNECTING 12'X1/4 (SUCTIONS) ×1
TUBE CONNECTING 12X1/4 (SUCTIONS) ×1 IMPLANT
WATER STERILE IRR 3000ML UROMA (IV SOLUTION) ×1 IMPLANT

## 2019-06-15 NOTE — Interval H&P Note (Signed)
History and Physical Interval Note:  06/15/2019 10:52 AM  Ronald Noble  has presented today for surgery, with the diagnosis of URETHRAL STRICTURE.  The various methods of treatment have been discussed with the patient and family. After consideration of risks, benefits and other options for treatment, the patient has consented to  Procedure(s) with comments: CYSTOSCOPY WITH URETHRAL DILATATION (N/A) - 30 MINS as a surgical intervention.  The patient's history has been reviewed, patient examined, no change in status, stable for surgery.  I have reviewed the patient's chart and labs.  Questions were answered to the patient's satisfaction.     Lillette Boxer Jose Corvin

## 2019-06-15 NOTE — Op Note (Signed)
Preoperative diagnosis: Recurrent posterior urethral stricture  Postop diagnosis: Same  Principal procedure: Cystoscopy, balloon dilation of recurrent posterior urethral stricture  Surgeon: Caisen Mangas  Anesthesia: General with LMA  Complications: None  Drains: None  Estimated blood loss: Less than 10 mL  Indications: 74 year old male who underwent TURP in 2008.  He has had 1 posterior urethral stricture dilated in the past.  He was recently found to have significant lower urinary tract symptoms and cystoscopically was noted to have a very tight posterior urethral stricture.  He presents at this time for cystoscopic management of this with balloon dilation.  I discussed the procedure with the patient as well as risks and complications.  He understands these and desires to proceed.  Findings: Anterior urethra was normal cystoscopically.  There was a very tight proximal urethral stricture.  Following dilation, this was easily traversed with the beak of the cystoscope.  The bladder urothelium appeared normal.  There were mild trabeculations.  Ureteral orifice ease were normal in configuration and location.  Description of procedure: The patient was properly identified in the holding area.  He received preoperative IV antibiotics.  Taken to the operating room where general anesthetic was administered with the LMA.  He was placed in the dorsolithotomy position.  Genitalia and perineum were prepped and draped.  Proper timeout was performed.  62 French panendoscope was advanced directly through the urethra up to the stricture.  The stricture was identified, and a sensor tip guidewire was passed through this and fluoroscopically was seen to curl in the bladder.  The cystoscope was then removed.  The 24 [place] NephroMax balloon was then passed across the stricture, properly positioned both cystoscopically and fluoroscopically, and then inflated to 20 atm of pressure for 10 minutes.  It was then deflated.   The balloon and guidewire were then both removed.  I then passed the cystoscope and inspected the bladder with the above-mentioned findings noted.  There being no significant bleeding, I did not pass a catheter.  Approximately 100 cc of water was left within the bladder for voiding later on.  At this point the procedure was terminated.  The scope was removed, the patient awakened and taken to the PACU in stable condition.  He tolerated procedure well.

## 2019-06-15 NOTE — Anesthesia Postprocedure Evaluation (Signed)
Anesthesia Post Note  Patient: Ronald Noble  Procedure(s) Performed: CYSTOSCOPY WITH URETHRAL DILATATION (N/A Urethra)     Patient location during evaluation: PACU Anesthesia Type: General Level of consciousness: awake and alert, patient cooperative and awake Pain management: pain level controlled Vital Signs Assessment: post-procedure vital signs reviewed and stable Respiratory status: spontaneous breathing, nonlabored ventilation and respiratory function stable Cardiovascular status: blood pressure returned to baseline and stable Postop Assessment: no apparent nausea or vomiting and adequate PO intake Anesthetic complications: no    Last Vitals:  Vitals:   06/15/19 1230 06/15/19 1245  BP: 135/71 140/76  Pulse: 62 (!) 58  Resp: 13 11  Temp:    SpO2: 100% 100%    Last Pain:  Vitals:   06/15/19 1245  TempSrc:   PainSc: 0-No pain                 Ireoluwa Gorsline,E. Jaydi Bray

## 2019-06-15 NOTE — Discharge Instructions (Signed)
1. You may see some blood in the urine and may have some burning with urination for 48-72 hours. You also may notice that you have to urinate more frequently or urgently after your procedure which is normal.  2. You should call should you develop an inability urinate, fever > 101, persistent nausea and vomiting that prevents you from eating or drinking to stay hydrated.  3. If you have a stent, you will likely urinate more frequently and urgently until the stent is removed and you may experience some discomfort/pain in the lower abdomen and flank especially when urinating. You may take pain medication prescribed to you if needed for pain. You may also intermittently have blood in the urine until the stent is removed. 4. If you have a catheter, you will be taught how to take care of the catheter by the nursing staff prior to discharge from the hospital.  You may periodically feel a strong urge to void with the catheter in place.  This is a bladder spasm and most often can occur when having a bowel movement or moving around. It is typically self-limited and usually will stop after a few minutes.  You may use some Vaseline or Neosporin around the tip of the catheter to reduce friction at the tip of the penis. You may also see some blood in the urine.  A very small amount of blood can make the urine look quite red.  As long as the catheter is draining well, there usually is not a problem.  However, if the catheter is not draining well and is bloody, you should call the office ([phone]) to notify us.  Post Anesthesia Home Care Instructions  Activity: Get plenty of rest for the remainder of the day. A responsible individual must stay with you for 24 hours following the procedure.  For the next 24 hours, DO NOT: -Drive a car -Operate machinery -Drink alcoholic beverages -Take any medication unless instructed by your physician -Make any legal decisions or sign important papers.  Meals: Start with  liquid foods such as gelatin or soup. Progress to regular foods as tolerated. Avoid greasy, spicy, heavy foods. If nausea and/or vomiting occur, drink only clear liquids until the nausea and/or vomiting subsides. Call your physician if vomiting continues.  Special Instructions/Symptoms: Your throat may feel dry or sore from the anesthesia or the breathing tube placed in your throat during surgery. If this causes discomfort, gargle with warm salt water. The discomfort should disappear within 24 hours.  If you had a scopolamine patch placed behind your ear for the management of post- operative nausea and/or vomiting:  1. The medication in the patch is effective for 72 hours, after which it should be removed.  Wrap patch in a tissue and discard in the trash. Wash hands thoroughly with soap and water. 2. You may remove the patch earlier than 72 hours if you experience unpleasant side effects which may include dry mouth, dizziness or visual disturbances. 3. Avoid touching the patch. Wash your hands with soap and water after contact with the patch.     

## 2019-06-15 NOTE — Telephone Encounter (Signed)
Referral placed to neuro rehab for reevaluation on a 6 monthly basis for Parkinson's disease for therapy needs, referral to PT, ST, and OT at neuro rehab.  FYI.

## 2019-06-15 NOTE — Anesthesia Preprocedure Evaluation (Addendum)
Anesthesia Evaluation  Patient identified by MRN, date of birth, ID band Patient awake    Reviewed: Allergy & Precautions, NPO status , Patient's Chart, lab work & pertinent test results  History of Anesthesia Complications Negative for: history of anesthetic complications  Airway Mallampati: I  TM Distance: >3 FB Neck ROM: Full    Dental  (+) Dental Advisory Given   Pulmonary sleep apnea (does not use CPAP) ,  06/13/2019 SARS coronavirus NEG   breath sounds clear to auscultation       Cardiovascular negative cardio ROS   Rhythm:Regular Rate:Normal     Neuro/Psych Parkinson's: Deep brain stimulator   Discussed with Dr Diona Fanti and [employer] rep, no cautery anticipated, will leave device functional through procedure    GI/Hepatic negative GI ROS, Neg liver ROS,   Endo/Other  negative endocrine ROS  Renal/GU negative Renal ROS     Musculoskeletal  (+) Arthritis ,   Abdominal   Peds  Hematology negative hematology ROS (+)   Anesthesia Other Findings   Reproductive/Obstetrics                            Anesthesia Physical Anesthesia Plan  ASA: III  Anesthesia Plan: General   Post-op Pain Management:    Induction: Intravenous  PONV Risk Score and Plan: 2 and Ondansetron and Dexamethasone  Airway Management Planned: LMA  Additional Equipment:   Intra-op Plan:   Post-operative Plan:   Informed Consent: I have reviewed the patients History and Physical, chart, labs and discussed the procedure including the risks, benefits and alternatives for the proposed anesthesia with the patient or authorized representative who has indicated his/her understanding and acceptance.     Dental advisory given  Plan Discussed with: CRNA and Surgeon  Anesthesia Plan Comments:        Anesthesia Quick Evaluation

## 2019-06-15 NOTE — Anesthesia Procedure Notes (Signed)
Procedure Name: LMA Insertion Date/Time: 06/15/2019 11:13 AM Performed by: Egor Fullilove D, CRNA Pre-anesthesia Checklist: Patient identified, Emergency Drugs available, Suction available and Patient being monitored Patient Re-evaluated:Patient Re-evaluated prior to induction Oxygen Delivery Method: Circle system utilized Preoxygenation: Pre-oxygenation with 100% oxygen Induction Type: IV induction Ventilation: Mask ventilation without difficulty LMA: LMA inserted LMA Size: 5.0 Tube type: Oral Number of attempts: 1 Placement Confirmation: positive ETCO2 and breath sounds checked- equal and bilateral Tube secured with: Tape Dental Injury: Teeth and Oropharynx as per pre-operative assessment

## 2019-06-15 NOTE — Transfer of Care (Signed)
Immediate Anesthesia Transfer of Care Note  Patient: Ronald Noble  Procedure(s) Performed: CYSTOSCOPY WITH URETHRAL DILATATION (N/A Urethra)  Patient Location: PACU  Anesthesia Type:General  Level of Consciousness: awake, alert  and oriented  Airway & Oxygen Therapy: Patient Spontanous Breathing and Patient connected to nasal cannula oxygen  Post-op Assessment: Report given to RN and Post -op Vital signs reviewed and stable  Post vital signs: Reviewed and stable  Last Vitals:  Vitals Value Taken Time  BP 120/75 06/15/19 1146  Temp    Pulse 60 06/15/19 1150  Resp 9 06/15/19 1150  SpO2 100 % 06/15/19 1150  Vitals shown include unvalidated device data.  Last Pain:  Vitals:   06/15/19 1002  TempSrc: Oral  PainSc: 0-No pain      Patients Stated Pain Goal: 6 ([id number])  Complications: No apparent anesthesia complications

## 2019-06-15 NOTE — Telephone Encounter (Signed)
NOted per MD.

## 2019-06-27 ENCOUNTER — Ambulatory Visit: Payer: Medicare Other

## 2019-06-27 ENCOUNTER — Ambulatory Visit: Payer: Medicare Other | Attending: Orthopaedic Surgery | Admitting: Physical Therapy

## 2019-06-27 ENCOUNTER — Ambulatory Visit: Payer: Medicare Other | Admitting: Occupational Therapy

## 2019-06-27 ENCOUNTER — Other Ambulatory Visit: Payer: Self-pay

## 2019-06-27 DIAGNOSIS — R29818 Other symptoms and signs involving the nervous system: Secondary | ICD-10-CM | POA: Diagnosis not present

## 2019-06-27 DIAGNOSIS — M25611 Stiffness of right shoulder, not elsewhere classified: Secondary | ICD-10-CM

## 2019-06-27 DIAGNOSIS — M6281 Muscle weakness (generalized): Secondary | ICD-10-CM | POA: Diagnosis not present

## 2019-06-27 DIAGNOSIS — R29898 Other symptoms and signs involving the musculoskeletal system: Secondary | ICD-10-CM | POA: Insufficient documentation

## 2019-06-27 DIAGNOSIS — R293 Abnormal posture: Secondary | ICD-10-CM | POA: Diagnosis not present

## 2019-06-27 DIAGNOSIS — R2681 Unsteadiness on feet: Secondary | ICD-10-CM

## 2019-06-27 DIAGNOSIS — R41840 Attention and concentration deficit: Secondary | ICD-10-CM | POA: Insufficient documentation

## 2019-06-27 DIAGNOSIS — R278 Other lack of coordination: Secondary | ICD-10-CM | POA: Diagnosis not present

## 2019-06-27 DIAGNOSIS — R41841 Cognitive communication deficit: Secondary | ICD-10-CM | POA: Insufficient documentation

## 2019-06-27 DIAGNOSIS — R2689 Other abnormalities of gait and mobility: Secondary | ICD-10-CM | POA: Diagnosis not present

## 2019-06-27 DIAGNOSIS — R471 Dysarthria and anarthria: Secondary | ICD-10-CM | POA: Diagnosis not present

## 2019-06-27 NOTE — Therapy (Signed)
[facility] [address] [place], [name], [address] Phone: [phone]   Fax:  [phone]  Occupational Therapy Treatment  Patient Details  Name: Ronald Noble MRN: [id number] Date of Birth: 11-21-1945 Referring Provider (OT): Dr. Rexene Alberts   Encounter Date: 06/27/2019  OT End of Session - 06/27/19 1215    Visit Number  1    Number of Visits  1    Date for OT Re-Evaluation  --   n/a   Authorization Type  Medicare and BCBS    OT Start Time  [phone]    OT Stop Time  0755    OT Time Calculation (min)  32 min    Activity Tolerance  Patient tolerated treatment well    Behavior During Therapy  [facility] for tasks assessed/performed       Past Medical History:  Diagnosis Date  . BPH (benign prostatic hyperplasia)   . Parkinson disease ([place])   . Skin cancer of face    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  . Sleep apnea    mild  . Spider bite    brown recluse/ left ankle/08/13  . Urethral stricture     Past Surgical History:  Procedure Laterality Date  . BURR HOLE W/ STEREOTACTIC INSERTION OF DBS LEADS / INTRAOP MICROELECTRODE RECORDING  04/2014  . COLONOSCOPY    . CYSTOSCOPY WITH URETHRAL DILATATION N/A 06/15/2019   Procedure: CYSTOSCOPY WITH URETHRAL DILATATION;  Surgeon: Franchot Gallo, MD;  Location: [facility];  Service: Urology;  Laterality: N/A;  30 MINS  . KNEE SURGERY     exploratory/left  . SKIN CANCER EXCISION  01/2014   face  . TRANSURETHRAL RESECTION OF PROSTATE     2008    There were no vitals filed for this visit.  Subjective Assessment - 06/27/19 1214    Currently in Pain?  Yes    Pain Score  2     Pain Location  Knee    Pain Orientation  Right    Pain Descriptors / Indicators  Aching    Pain Onset  More than a month ago    Pain Frequency  Intermittent    Aggravating Factors   standing    Pain Relieving Factors  sitting         [facility] OT Assessment -  06/27/19 1547      Assessment   Medical Diagnosis  Parkinson's disease    Referring Provider (OT)  Dr. Rexene Alberts    Onset Date/Surgical Date  06/15/19      Precautions   Precautions  Fall      Balance Screen   Has the patient fallen in the past 6 months  No    Has the patient had a decrease in activity level because of a fear of falling?   No    Is the patient reluctant to leave their home because of a fear of falling?   No      Home  Environment   Family/patient expects to be discharged to:  Private residence    Lives With  [place]  Full time employment    Vocation Requirements  attorney    Leisure  was doing yoga,       ADL   Eating/Feeding  Modified independent   increased spills   Grooming  Modified independent    Upper Body Bathing  Modified independent    Lower Body  Bathing  Modified independent    Upper Body Dressing  Increased time;Independent    Lower Body Dressing  Increased time    Toilet Transfer  Modified independent    Tub/Shower Transfer  Modified independent      IADL   Medication Management  Is responsible for taking medication in correct dosages at correct time      Mobility   Mobility Status  Freezing;History of falls    Mobility Status Comments  acceleration, difficulty stopping, "controlled falls"      Written Expression   Handwriting  100% legible;Mild micrographia      Cognition   Overall Cognitive Status  Impaired/Different from baseline    Memory  Impaired    Memory Impairment  Decreased short term memory    Awareness  Impaired    Behaviors  Impulsive      Observation/Other Assessments   Simulated Eating Time (seconds)  11.41    Donning Doffing Jacket Time (seconds)  11.21    Donning Doffing Jacket Comments  3 button/ unbutton 34.60      Posture/Postural Control   Posture/Postural Control  Postural limitations    Postural Limitations  Rounded Shoulders;Weight shift right;Flexed trunk;Forward head       Coordination   Gross Motor Movements are Fluid and Coordinated  Yes    9 Hole Peg Test  Right;Left    Right 9 Hole Peg Test  28.12    Left 9 Hole Peg Test  34.90    Box and Blocks  RUE 58 LUE 53      Edema   Edema  edema present bilateral elbows after [place]. Pt reports it is not painful . Therapist recommends pt discontinues weighted biceps curls until edema resolves. Therapist recommends pt sees MD if edema does not resolve.                                   Plan - 06/27/19 1216    Clinical Impression Statement  Pt with hx of Parkinson's disease returns for a re-evaluation. Pt is maintaining ADL function and fine motor scores are remaining steady. Pt does not require occupational therapy at this time. Therapist recommends a 6 month screen.    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  PMH: PD diagnosed in 2008, s/p bilateral DBS, skin cancer, sleep apnea, BPH, TURP, orthostatic hypotension, syncopal episodes; arthritic changes in spine with hx of back pain    Occupational performance deficits (Please refer to evaluation for details):  ADL's;IADL's;Work;Leisure;Social Participation    Body Structure / Function / Physical Skills  ADL;Coordination;Endurance;UE functional use;GMC;Decreased knowledge of precautions;Balance;Decreased knowledge of use of DME;Flexibility;IADL;Pain;FMC;Dexterity;Mobility;Gait;ROM;Tone    Cognitive Skills  Attention;Safety Awareness;Memory    Rehab Potential  Good    OT Frequency  1x visist    OT Duration  4 weeks    OT Treatment/Interventions  Self-care/ADL training;Therapeutic exercise;Patient/family education;Moist Heat;Energy conservation;Therapist, nutritional;Therapeutic activities;Balance training;Cognitive remediation/compensation;Passive range of  motion;Manual Therapy;DME and/or AE instruction;Cryotherapy;Fluidtherapy;Aquatic Therapy;Neuromuscular education    Plan  follow up in approx 6-8 months for reassessment/screen, no therapy needed at this time    Recommended Other Services     Consulted and Agree with Plan of Care  Patient       Patient will benefit from skilled therapeutic intervention  in order to improve the following deficits and impairments:   Body Structure / Function / Physical Skills: ADL, Coordination, Endurance, UE functional use, GMC, Decreased knowledge of precautions, Balance, Decreased knowledge of use of DME, Flexibility, IADL, Pain, FMC, Dexterity, Mobility, Gait, ROM, Tone Cognitive Skills: Attention, Safety Awareness, Memory     Visit Diagnosis: Other symptoms and signs involving the nervous system  Other symptoms and signs involving the musculoskeletal system  Stiffness of right shoulder, not elsewhere classified  Attention and concentration deficit  Unsteadiness on feet  Muscle weakness (generalized)  Other lack of coordination  Abnormal posture    Problem List Patient Active Problem List   Diagnosis Date Noted  . Other bursitis of knee, left knee 12/06/2018  . Pain in right leg 12/06/2018  . Parkinson disease ([name])     Marcele Kosta 06/27/2019, 3:48 PM Theone Murdoch, OTR/L [phone] Phone: [phone] 3:48 PM 06/27/19  [facility] [address] [place], [name], [address] Phone: [phone]   Fax:  [phone]  Name: Kaushal Dirico MRN: [id number] Date of Birth: 05-19-1945

## 2019-06-27 NOTE — Therapy (Signed)
[facility] [address] [place], [name], [address] Phone: [phone]   Fax:  [phone]  Physical Therapy Evaluation  Patient Details  Name: Ronald Noble MRN: [id number] Date of Birth: 08/16/1945 Referring Provider (PT): Star Age ((Also sees [name]))   Encounter Date: 06/27/2019  PT End of Session - 06/27/19 1126    Visit Number  1    Number of Visits  1    Authorization Type  Medicare & BCBS    PT Start Time  0806    PT Stop Time  0837    PT Time Calculation (min)  31 min    Activity Tolerance  Patient tolerated treatment well    Behavior During Therapy  [facility] for tasks assessed/performed       Past Medical History:  Diagnosis Date  . BPH (benign prostatic hyperplasia)   . Parkinson disease ([place])   . Skin cancer of face    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  . Sleep apnea    mild  . Spider bite    brown recluse/ left ankle/08/13  . Urethral stricture     Past Surgical History:  Procedure Laterality Date  . BURR HOLE W/ STEREOTACTIC INSERTION OF DBS LEADS / INTRAOP MICROELECTRODE RECORDING  04/2014  . COLONOSCOPY    . CYSTOSCOPY WITH URETHRAL DILATATION N/A 06/15/2019   Procedure: CYSTOSCOPY WITH URETHRAL DILATATION;  Surgeon: Franchot Gallo, MD;  Location: [facility];  Service: Urology;  Laterality: N/A;  30 MINS  . KNEE SURGERY     exploratory/left  . SKIN CANCER EXCISION  01/2014   face  . TRANSURETHRAL RESECTION OF PROSTATE     2008    There were no vitals filed for this visit.   Subjective Assessment - 06/27/19 0809    Subjective  Do my exercises each day-combinations of things.  Saw Richardson Landry at [facility] for knee issues; still doing those exercises.  No falls in the past 6 months; do have stumbles, but I'm able to catch myself now.    Pertinent History  PD 2008, bilateral DBS (2018)    Patient Stated Goals  Think I'm doing okay-no specific  goals.    Currently in Pain?  No/denies         [facility] PT Assessment - 06/27/19 [id number]      Assessment   Medical Diagnosis  Parkinson's disease    Referring Provider (PT)  Star Age   (Also sees [name])   Onset Date/Surgical Date  11/30/18   d/c from previous PD specific PT   Hand Dominance  Right    Prior Therapy  PT for hamstring pain, tightness (02/2019)      Precautions   Precautions  Fall    Precaution Comments  DBS      Balance Screen   Has the patient fallen in the past 6 months  Yes    How many times?  0    Has the patient had a decrease in activity level because of a fear of falling?   No    Is the patient reluctant to leave their home because of a fear of falling?   No      Home Environment   Living Environment  Private residence    Living Arrangements  Spouse/significant other    Available Help at Discharge  Family    Type of [place] to enter  Entrance Stairs-Number of Steps  2    Entrance Stairs-Rails  Left    Home Layout  Two level;Able to live on main level with bedroom/bathroom    Home Equipment  Other (comment);Cane - quad;Walker - standard   2 walking poles     Prior Function   Level of Independence  Independent    Vocation  Full time employment    Vocation Requirements  attorney    Leisure  Does yoga, walks in the morning      Posture/Postural Control   Posture/Postural Control  Postural limitations    Postural Limitations  Rounded Shoulders;Weight shift right;Flexed trunk;Forward head      ROM / Strength   AROM / PROM / Strength  Strength      Strength   Overall Strength  Within functional limits for tasks performed      Transfers   Transfers  Sit to Stand;Stand to Sit    Sit to Stand  6: Modified independent (Device/Increase time);Without upper extremity assist;From chair/3-in-1    Five time sit to stand comments   10.44    Stand to Sit  6: Modified independent (Device/Increase time);Without upper  extremity assist;To chair/3-in-1      Ambulation/Gait   Ambulation/Gait  Yes    Ambulation/Gait Assistance  7: Independent    Ambulation Distance (Feet)  200 Feet    Assistive device  None    Gait Pattern  Decreased arm swing - right;Lateral trunk lean to right;Decreased trunk rotation;Decreased step length - left;Decreased step length - right    Ambulation Surface  Level;Indoor    Gait velocity  8.13 sec = 4.03 f/tsec      Standardized Balance Assessment   Standardized Balance Assessment  Timed Up and Go Test      Mini-BESTest   Sit To Stand  Normal: Comes to stand without use of hands and stabilizes independently.    Rise to Toes  Normal: Stable for 3 s with maximum height.    Stand on one leg (left)  Moderate: < 20 s   LLE:  1.03, 4.28; RLE 9.12, 12   Stand on one leg (right)  Moderate: < 20 s    Stand on one leg - lowest score  1    Compensatory Stepping Correction - Forward  Normal: Recovers independently with a single, large step (second realignement is allowed).    Compensatory Stepping Correction - Backward  Normal: Recovers independently with a single, large step    Compensatory Stepping Correction - Left Lateral  Normal: Recovers independently with 1 step (crossover or lateral OK)    Compensatory Stepping Correction - Right Lateral  Normal: Recovers independently with 1 step (crossover or lateral OK)    Stepping Corredtion Lateral - lowest score  2    Stance - Feet together, eyes open, firm surface   Normal: 30s    Stance - Feet together, eyes closed, foam surface   Normal: 30s    Incline - Eyes Closed  Normal: Stands independently 30s and aligns with gravity    Change in Gait Speed  Normal: Significantly changes walkling speed without imbalance    Walk with head turns - Horizontal  Normal: performs head turns with no change in gait speed and good balance    Walk with pivot turns  Moderate:Turns with feet close SLOW (>4 steps) with good balance.    Step over obstacles   Moderate: Steps over box but touches box OR displays cautious behavior by slowing gait.  Timed UP & GO with Dual Task  Normal: No noticeable change in sitting, standing or walking while backward counting when compared to TUG without    Mini-BEST total score  25      Timed Up and Go Test   TUG  Normal TUG;Manual TUG;Cognitive TUG    Normal TUG (seconds)  8.04    Manual TUG (seconds)  8.1    Cognitive TUG (seconds)  7.72      High Level Balance   High Level Balance Comments  --                Objective measurements completed on examination: See above findings.              PT Education - 06/27/19 1125    Education Details  Results of eval; no further PT needs at this time; continue current exercise program that he is performing; plan for return PT screen/eval in 6-9 months    Person(s) Educated  Patient    Methods  Explanation    Comprehension  Verbalized understanding                  Plan - 06/27/19 1127    Clinical Impression Statement  Pt is a 74 year old male with history of Parkinson's disease, deep brain stimulator placement, who has had falls in the past.  He reports no falls in the past 6 months, and is able to recover stumbles better.  He verbalizes consistent exercise routine, which consists of lower body strengthening, some yoga/TaiChi moves, and walking.  He presents with scores in comparison to previous bout of therapy discharge, and his 5x sit<>stand score, gait velocity, TUG scores and MiniBESTest scores are all better than at his discharge.  He does report that morning is his best time of the day, some additional slowing noted in afternoons (eval completed in the morning); however, pt does not appear to demonstrate need for skilled PT at this time.    Personal Factors and Comorbidities  Comorbidity 1    Comorbidities  Parkinson's disease    Examination-Activity Limitations  Locomotion Level    Stability/Clinical Decision Making   Stable/Uncomplicated    Clinical Decision Making  Low    PT Frequency  One time visit   eval only   PT Next Visit Plan  No further skilled PT needed; plan for PT screen versus eval in 6-9 months.    Consulted and Agree with Plan of Care  Patient       Patient will benefit from skilled therapeutic intervention in order to improve the following deficits and impairments:     Visit Diagnosis: Other abnormalities of gait and mobility     Problem List Patient Active Problem List   Diagnosis Date Noted  . Other bursitis of knee, left knee 12/06/2018  . Pain in right leg 12/06/2018  . Parkinson disease ([place])     Demri Poulton W. 06/27/2019, 11:32 AM  Frazier Butt., PT   [facility] [address] [place], [name], [address] Phone: [phone]   Fax:  [phone]  Name: Daisean Almasri MRN: [id number] Date of Birth: 1945/08/17

## 2019-06-27 NOTE — Patient Instructions (Signed)
Get back to doing your loud "ah"s twice daily!

## 2019-06-27 NOTE — Therapy (Signed)
[facility] [address] [place], [name], [address] Phone: [phone]   Fax:  [phone]  Speech Language Pathology Evaluation  Patient Details  Name: Ronald Noble MRN: [id number] Date of Birth: March 15, 1946 Referring Provider (SLP): Star Age, MD   Encounter Date: 06/27/2019  End of Session - 06/27/19 1254    Visit Number  1    Number of Visits  17    Date for SLP Re-Evaluation  09/25/19   90 days   SLP Start Time  0848    SLP Stop Time   0925    SLP Time Calculation (min)  37 min    Activity Tolerance  Patient tolerated treatment well       Past Medical History:  Diagnosis Date  . BPH (benign prostatic hyperplasia)   . Parkinson disease ([place])   . Skin cancer of face    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  . Sleep apnea    mild  . Spider bite    brown recluse/ left ankle/08/13  . Urethral stricture     Past Surgical History:  Procedure Laterality Date  . BURR HOLE W/ STEREOTACTIC INSERTION OF DBS LEADS / INTRAOP MICROELECTRODE RECORDING  04/2014  . COLONOSCOPY    . CYSTOSCOPY WITH URETHRAL DILATATION N/A 06/15/2019   Procedure: CYSTOSCOPY WITH URETHRAL DILATATION;  Surgeon: Franchot Gallo, MD;  Location[facility];  Service: Urology;  Laterality: N/A;  30 MINS  . KNEE SURGERY     exploratory/left  . SKIN CANCER EXCISION  01/2014   face  . TRANSURETHRAL RESECTION OF PROSTATE     2008    There were no vitals filed for this visit.  Subjective Assessment - 06/27/19 1047    Subjective  "My wife asks me to speak up." Pt reports wife sees him primarily after 5pm, which is at a time when his speech seems softer to him due to fatigue.    Currently in Pain?  No/denies         SLP Evaluation [facility] - 06/27/19 1047      SLP Visit Information   SLP Received On  06/27/19    Referring Provider (SLP)  Star Age, MD    Onset Date  2012    Medical Diagnosis   Parkinson's Disease      Subjective   Patient/Family Stated Goal  "Talk in order to be better understood at home."      General Information   HPI  Pt well-known to this SLP from previous therapy courses for volume increase and articulatory degradation as a result of Parkinson's disease. Pt had DBS placed March 2012 and as time passes, note that pt's speech becomes rushed on a more frequent basis.    Mobility Status  PT eval today - pt was not picked up for PT      Balance Screen   Has the patient fallen in the past 6 months  No      Prior Functional Status   Cognitive/Linguistic Baseline  Baseline deficits    Baseline deficit details  impulsivity, (divided) attention    Type of Home  House     Lives With  Spouse    Available Support  Family    Vocation  --   employed as attorney - still practicing     Cognition   Overall Cognitive Status  History of cognitive impairments - at baseline   not formally assessed but may be in the  future PRN   Area of Impairment  Attention;Memory   impulsivity   Attention Comments  Historically when pt has engaged in conversation requiring higher cognitive demand he has had more difficulty with loudness. Today however, this difficulty was not observed by SLP.     Memory Comments  pt was provided memory strategies in previous therapy courses; pt verbalized one strategy that he uses regularly (association) to SLP today     Behaviors  Impulsive      Auditory Comprehension   Overall Auditory Comprehension  Appears within functional limits for tasks assessed      Verbal Expression   Overall Verbal Expression  Appears within functional limits for tasks assessed    Other Verbal Expression Comments  In order to assess volume in a myriad of different types of conversation SLP engaged pt in simple to complex conversation - pt without notable anomia in conversation today.       Oral Motor/Sensory Function   Overall Oral Motor/Sensory Function  Impaired     Labial Coordination  Reduced    Lingual Coordination  Reduced    Overall Oral Motor/Sensory Function  Limited oral mech exam due to COVID-19 risk.       Motor Speech   Overall Motor Speech  Appears within functional limits for tasks assessed    Respiration  Within functional limits    Phonation  Normal    Articulation  Impaired    Level of Impairment  Phrase    Intelligibility  Intelligible   SLP-careful listening every 5 min due to rushes of speech   Interfering Components  --   DBS placement     In ST room and outdoors today pt maintained volumes in the WNL, and SLP had to engage in minimal careful listening every 5 minutes due to rushes in pt's speech. However, he states he has increased difficulty with both  loudness and clarity in the mid to late afternoons, consistently, due to fatigue.                 SLP Education - 06/27/19 1252    Education Details  pt would best be seen later in the day if he has difficulty with loudness during that time of day    Person(s) Educated  Patient    Methods  Explanation    Comprehension  Verbalized understanding       SLP Short Term Goals - 06/27/19 1300      SLP SHORT TERM GOAL #1   Title  Pt will average low-mid 90sdB with loud /a/ for 3 sessions with rare min A    Time  4    Period  Weeks    Status  New      SLP SHORT TERM GOAL #2   Title  Pt will average low-mid 70sdB on structured speech tasks with rare min A for three sessions    Time  4    Period  Weeks    Status  New      SLP SHORT TERM GOAL #3   Title  Pt will maintain average low 70s dB over 8 minute simple conversation with rare min A, for 2 sessions    Time  4    Period  Weeks    Status  New      SLP SHORT TERM GOAL #4   Title  pt will maintain speech intelligibility at 95%+ using intelligibility compensations(due to rushed speech) for 8 minutes simple conversation in three sesssions  Time  4    Period  Weeks    Status  New       SLP Long Term  Goals - 06/27/19 1301      SLP LONG TERM GOAL #1   Title  Pt will maintain low 70sdB over 10 minute mod complex conversation with rare min A in three therapy sessions    Time  8    Period  Weeks   or 17 sessions   Status  New      SLP LONG TERM GOAL #2   Title  Pt will achieve 95%+ speech intelligibility outside of tx room (due to rushing speech) over 12 minutes mod complex conversation with rare min A, in three sessions    Time  8    Period  Weeks    Status  New       Plan - 06/27/19 1255    Clinical Impression Statement  Pt presents with WNL volume today both in ST room and outdoors in conversation (simple to complex) of 25 minutes, however pt endorses loudness is more challenging in mid to late afternoon, and this is the time when he communicates with wife. Pt states little difficulty with volume or intelligbility at work due to it being earlier in the day. Although pt does not exhibit sub-normal volume at time of eval, ST is warranted as pt fully endorses loudness decay and reduced intelligibliity later in the day. SLP strongly encouraged pt to schedule ST during the mid to late afternoon so work can be done on his speech clarity when functionally necessary for him. Additionally, pt has history of cognitive-communication deficits even though they were not formally tested today - they may need to be formally tested later in therapy course and addressed. Pt denies overt s/sx aspiraiton with POs.    Speech Therapy Frequency  2x / week    Duration  --   8 weeks or 17 total sessions   Treatment/Interventions  Compensatory techniques;Functional tasks;Cueing hierarchy;Language facilitation;Cognitive reorganization;SLP instruction and feedback;Patient/family education;Internal/external aids    Potential to Achieve Goals  Good       Patient will benefit from skilled therapeutic intervention in order to improve the following deficits and impairments:   Dysarthria and anarthria - Plan: SLP plan  of care cert/re-cert  Cognitive communication deficit - Plan: SLP plan of care cert/re-cert    Problem List Patient Active Problem List   Diagnosis Date Noted  . Other bursitis of knee, left knee 12/06/2018  . Pain in right leg 12/06/2018  . Parkinson disease ([place])     [place] ,[place], [place]  06/27/2019, 1:08 PM  [facility] [address] [place], [name], [address] Phone: [phone]   Fax:  [phone]  Name: Levoy Geisen MRN: [id number] Date of Birth: 1945/09/27

## 2019-06-29 DIAGNOSIS — M7032 Other bursitis of elbow, left elbow: Secondary | ICD-10-CM | POA: Diagnosis not present

## 2019-07-05 ENCOUNTER — Encounter: Payer: Self-pay | Admitting: Orthopaedic Surgery

## 2019-07-05 ENCOUNTER — Ambulatory Visit (INDEPENDENT_AMBULATORY_CARE_PROVIDER_SITE_OTHER): Payer: Medicare Other | Admitting: Orthopaedic Surgery

## 2019-07-05 ENCOUNTER — Other Ambulatory Visit: Payer: Self-pay

## 2019-07-05 DIAGNOSIS — M7021 Olecranon bursitis, right elbow: Secondary | ICD-10-CM | POA: Insufficient documentation

## 2019-07-05 DIAGNOSIS — M7022 Olecranon bursitis, left elbow: Secondary | ICD-10-CM | POA: Diagnosis not present

## 2019-07-05 NOTE — Progress Notes (Signed)
Office Visit Note   Patient: Ronald Noble           Date of Birth: June 06, 1945           MRN: [id number] Visit Date: 07/05/2019              Requested by: [name], MD [address] [place],   [address] PCP: [name], MD   Assessment & Plan: Visit Diagnoses:  1. Bilateral olecranon bursitis     Plan: Ronald Noble has bilateral olecranon bursitis with a larger mass on the left versus the right elbow.  Presently minimally symptomatic.  No evidence of gout or infection.  Long discussion regarding different treatment options including aspiration, cortisone or even surgery.  For the moment Ronald Noble is not having any significant problems and just wanted some reassurance.  We will plan to see back as needed  Follow-Up Instructions: Return if symptoms worsen or fail to improve.   Orders:  No orders of the defined types were placed in this encounter.  No orders of the defined types were placed in this encounter.     Procedures: No procedures performed   Clinical Data: No additional findings.   Subjective: Chief Complaint  Patient presents with  . Right Elbow - Edema  . Left Elbow - Edema  Patient presents today for swelling in both elbows. He said that it started while hiking up a steep hill with poles on 06/17/2019. No injury. He saw his PCP and was told he had bursitis in his elbows. He was told to apply ice and take Ibuprofen. His swelling is located on the backside of each elbow, with the left side being the worse. No pain. He is right hand dominant.  Has not developed any redness or any pain.  Has had some swelling in both elbows but more prominent on the left  HPI  Review of Systems   Objective: Vital Signs: Ht 5\' 11"  (1.803 m)   Wt 155 lb (70.3 kg)   BMI 21.62 kg/m   Physical Exam Constitutional:      Appearance: He is well-developed.  Eyes:     Pupils: Pupils are equal, round, and reactive to light.  Pulmonary:     Effort: Pulmonary effort is  normal.  Skin:    General: Skin is warm and dry.  Neurological:     Mental Status: He is alert and oriented to person, place, and time.  Psychiatric:        Behavior: Behavior normal.     Ortho Exam left elbow with typical olecranon bursitis.  There is about a centimeter and a half mass that is not tender and not associated with ecchymosis or erythema.  Full range of motion of elbow.  Has a much smaller area of swelling over the right elbow with fluctuation but no redness or pain Specialty Comments:  No specialty comments available.  Imaging: No results found.   PMFS History: Patient Active Problem List   Diagnosis Date Noted  . Bilateral olecranon bursitis 07/05/2019  . Other bursitis of knee, left knee 12/06/2018  . Pain in right leg 12/06/2018  . Parkinson disease [facility])    Past Medical History:  Diagnosis Date  . BPH (benign prostatic hyperplasia)   . Parkinson disease ([place])   . Skin cancer of face    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  . Sleep apnea    mild  . Spider bite    brown recluse/ left ankle/08/13  .  Urethral stricture     Family History  Problem Relation Age of Onset  . COPD Mother   . Cancer Father        Bladder  . Colon cancer Neg Hx   . Esophageal cancer Neg Hx   . Stomach cancer Neg Hx     Past Surgical History:  Procedure Laterality Date  . BURR HOLE W/ STEREOTACTIC INSERTION OF DBS LEADS / INTRAOP MICROELECTRODE RECORDING  04/2014  . COLONOSCOPY    . CYSTOSCOPY WITH URETHRAL DILATATION N/A 06/15/2019   Procedure: CYSTOSCOPY WITH URETHRAL DILATATION;  Surgeon: Franchot Gallo, MD;  Location: [facility];  Service: Urology;  Laterality: N/A;  30 MINS  . KNEE SURGERY     exploratory/left  . SKIN CANCER EXCISION  01/2014   face  . TRANSURETHRAL RESECTION OF PROSTATE     2008   Social History   Occupational History  . Occupation: LAWYER    Employer: [employer]  Tobacco Use  . Smoking status:  Never Smoker  . Smokeless tobacco: Never Used  Substance and Sexual Activity  . Alcohol use: Yes    Alcohol/week: 5.0 standard drinks    Types: 3 Glasses of wine, 2 Cans of beer per week    Comment: occasional  . Drug use: No  . Sexual activity: Not on file

## 2019-07-11 ENCOUNTER — Other Ambulatory Visit: Payer: Self-pay

## 2019-07-11 ENCOUNTER — Ambulatory Visit: Payer: Medicare Other | Attending: Orthopaedic Surgery | Admitting: Speech Pathology

## 2019-07-11 DIAGNOSIS — R471 Dysarthria and anarthria: Secondary | ICD-10-CM | POA: Insufficient documentation

## 2019-07-11 DIAGNOSIS — R41841 Cognitive communication deficit: Secondary | ICD-10-CM | POA: Diagnosis not present

## 2019-07-11 NOTE — Patient Instructions (Addendum)
Speech exercises: twice a day  1) Say "ah" with your loud, good quality voice (use your abs). Aim for effort level 7 or 8. Make sure you take a full breath before each one (no rushing).    5 times  2) Ask Ronald Noble to help you come up with some short phrases and sentences that you say every day. Write them down below. Read them out loud with same effort as your loud "ahhh"  1. How was your day?  2. What did you do?  3.    4.   5.   6.  7.  8.  9.  10.

## 2019-07-11 NOTE — Therapy (Signed)
[facility] [address] [place], [name], [address] Phone: [phone]   Fax:  [phone]  Speech Language Pathology Treatment  Patient Details  Name: Ronald Noble MRN: [id number] Date of Birth: 30-Nov-1945 Referring Provider (SLP): Star Age, MD   Encounter Date: 07/11/2019  End of Session - 07/11/19 1817    Visit Number  2    Number of Visits  17    Date for SLP Re-Evaluation  09/25/19   90 days   SLP Start Time  1702    SLP Stop Time   [id number]    SLP Time Calculation (min)  43 min    Activity Tolerance  Patient tolerated treatment well       Past Medical History:  Diagnosis Date  . BPH (benign prostatic hyperplasia)   . Parkinson disease ([place])   . Skin cancer of face    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  . Sleep apnea    mild  . Spider bite    brown recluse/ left ankle/08/13  . Urethral stricture     Past Surgical History:  Procedure Laterality Date  . BURR HOLE W/ STEREOTACTIC INSERTION OF DBS LEADS / INTRAOP MICROELECTRODE RECORDING  04/2014  . COLONOSCOPY    . CYSTOSCOPY WITH URETHRAL DILATATION N/A 06/15/2019   Procedure: CYSTOSCOPY WITH URETHRAL DILATATION;  Surgeon: Franchot Gallo, MD;  Location: [facility];  Service: Urology;  Laterality: N/A;  30 MINS  . KNEE SURGERY     exploratory/left  . SKIN CANCER EXCISION  01/2014   face  . TRANSURETHRAL RESECTION OF PROSTATE     2008    There were no vitals filed for this visit.  Subjective Assessment - 07/11/19 1707    Subjective  "I remembered you as Stanton Kidney in the manger."    Currently in Pain?  No/denies            ADULT SLP TREATMENT - 07/11/19 1708      General Information   Behavior/Cognition  Alert;Cooperative      Treatment Provided   Treatment provided  Cognitive-Linquistic      Pain Assessment   Pain Assessment  No/denies pain      Cognitive-Linquistic Treatment   Treatment  focused on  Dysarthria    Skilled Treatment  Patient reports he has been doing tongue twisters daily. He also sometimes uses his voice recognition software: "it doesn't lie." He entered tx room with rapid rate of speech which impacted intelligibility more than volume. Vocal intensity averaged ~70dB at 30 cm in conversation, and there were several periods where pt maintained average in normal range (low 70s). SLP established HEP for pt with loud /a/, and pt is to collaborate with wife to generate some everyday phrases, as he had difficulty focusing on this task in session today (frequent redirections to topic necessary). Loud /a/ averaged low 90s dB at 30 cm with occasional min cues to slow rate and take a full breath prior to each repetition. In sentence level reading tasks, pt maintained average of 73dB, but required frequent min-mod A for slower rate.       Assessment / Recommendations / Plan   Plan  Continue with current plan of care      Progression Toward Goals   Progression toward goals  Progressing toward goals       SLP Education - 07/11/19 1817    Education Details  loud /a/. when running out of air, need to  stop and take a breath    Person(s) Educated  Patient    Methods  Explanation    Comprehension  Verbalized understanding       SLP Short Term Goals - 07/11/19 1711      SLP SHORT TERM GOAL #1   Title  Pt will average low-mid 90sdB with loud /a/ for 3 sessions with rare min A    Time  4    Period  Weeks    Status  On-going      SLP SHORT TERM GOAL #2   Title  Pt will average low-mid 70sdB on structured speech tasks with rare min A for three sessions    Time  4    Period  Weeks    Status  On-going      SLP SHORT TERM GOAL #3   Title  Pt will maintain average low 70s dB over 8 minute simple conversation with rare min A, for 2 sessions    Time  4    Period  Weeks    Status  On-going      SLP SHORT TERM GOAL #4   Title  pt will maintain speech intelligibility at 95%+  using intelligibility compensations(due to rushed speech) for 8 minutes simple conversation in three sesssions    Time  4    Period  Weeks    Status  On-going       SLP Long Term Goals - 07/11/19 1711      SLP LONG TERM GOAL #1   Title  Pt will maintain low 70sdB over 10 minute mod complex conversation with rare min A in three therapy sessions    Time  8    Period  Weeks   or 17 sessions   Status  On-going      SLP LONG TERM GOAL #2   Title  Pt will achieve 95%+ speech intelligibility outside of tx room (due to rushing speech) over 12 minutes mod complex conversation with modified independence (external cues) in three sessions    Time  8    Period  Weeks    Status  On-going       Plan - 07/11/19 1817    Clinical Impression Statement  Pt loudness averaged 70 dB today, with intermittent periods of WNL intensity. Intelligibility moreso affected by short rushes of speech than vocal intensity. Pt endorses loudness is more challenging in mid to late afternoon, and this is the time when he communicates with wife. Pt states little difficulty with volume or intelligbility at work due to it being earlier in the day. Although pt does not exhibit sub-normal volume at time of eval, ST is warranted as pt fully endorses loudness decay and reduced intelligibliity later in the day. SLP strongly encouraged pt to schedule ST during the mid to late afternoon so work can be done on his speech clarity when functionally necessary for him. Additionally, pt has history of cognitive-communication deficits even though they were not formally tested today - they may need to be formally tested later in therapy course and addressed.    Speech Therapy Frequency  2x / week    Duration  --   8 weeks or 17 total sessions   Treatment/Interventions  Compensatory techniques;Functional tasks;Cueing hierarchy;Language facilitation;Cognitive reorganization;SLP instruction and feedback;Patient/family education;Internal/external  aids    Potential to Achieve Goals  Good       Patient will benefit from skilled therapeutic intervention in order to improve the following deficits and impairments:  Dysarthria and anarthria  Cognitive communication deficit    Problem List Patient Active Problem List   Diagnosis Date Noted  . Bilateral olecranon bursitis 07/05/2019  . Other bursitis of knee, left knee 12/06/2018  . Pain in right leg 12/06/2018  . Parkinson disease [facility])    Deneise Lever, [place], CCC-SLP Speech-Language Pathologist  Aliene Altes 07/11/2019, 6:19 PM  [facility] [address] [place], [name], [address] Phone: [phone]   Fax:  [phone]   Name: Ronald Noble MRN: [id number] Date of Birth: Feb 01, 1946

## 2019-07-12 DIAGNOSIS — N401 Enlarged prostate with lower urinary tract symptoms: Secondary | ICD-10-CM | POA: Diagnosis not present

## 2019-07-12 DIAGNOSIS — R350 Frequency of micturition: Secondary | ICD-10-CM | POA: Diagnosis not present

## 2019-07-12 DIAGNOSIS — N35011 Post-traumatic bulbous urethral stricture: Secondary | ICD-10-CM | POA: Diagnosis not present

## 2019-07-21 ENCOUNTER — Other Ambulatory Visit: Payer: Self-pay

## 2019-07-21 ENCOUNTER — Ambulatory Visit: Payer: Medicare Other

## 2019-07-21 DIAGNOSIS — R41841 Cognitive communication deficit: Secondary | ICD-10-CM

## 2019-07-21 DIAGNOSIS — R471 Dysarthria and anarthria: Secondary | ICD-10-CM

## 2019-07-21 NOTE — Therapy (Signed)
[facility] [address] [place], [name], [address] Phone: [phone]   Fax:  [phone]  Speech Language Pathology Treatment  Patient Details  Name: Ronald Noble MRN: [id number] Date of Birth: 10/17/1945 Referring Provider (SLP): Star Age, MD   Encounter Date: 07/21/2019  End of Session - 07/21/19 1438    Visit Number  3    Number of Visits  17    Date for SLP Re-Evaluation  09/25/19    SLP Start Time  [id number]    SLP Stop Time   1402    SLP Time Calculation (min)  38 min    Activity Tolerance  Patient tolerated treatment well       Past Medical History:  Diagnosis Date  . BPH (benign prostatic hyperplasia)   . Parkinson disease ([place])   . Skin cancer of face    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  . Sleep apnea    mild  . Spider bite    brown recluse/ left ankle/08/13  . Urethral stricture     Past Surgical History:  Procedure Laterality Date  . BURR HOLE W/ STEREOTACTIC INSERTION OF DBS LEADS / INTRAOP MICROELECTRODE RECORDING  04/2014  . COLONOSCOPY    . CYSTOSCOPY WITH URETHRAL DILATATION N/A 06/15/2019   Procedure: CYSTOSCOPY WITH URETHRAL DILATATION;  Surgeon: Franchot Gallo, MD;  Location: [facility];  Service: Urology;  Laterality: N/A;  30 MINS  . KNEE SURGERY     exploratory/left  . SKIN CANCER EXCISION  01/2014   face  . TRANSURETHRAL RESECTION OF PROSTATE     2008    There were no vitals filed for this visit.  Subjective Assessment - 07/21/19 1323    Subjective  Pt nearly 10 minutes late to session today. Pt with incr'd dyskenesias today than previous time SLP saw pt.    Currently in Pain?  No/denies            ADULT SLP TREATMENT - 07/21/19 1325      General Information   Behavior/Cognition  Alert;Cooperative;Pleasant mood      Treatment Provided   Treatment provided  Cognitive-Linquistic      Cognitive-Linquistic Treatment   Treatment focused on  Dysarthria    Skilled Treatment  (re: everyday sentences) "Basically those sentences are very routine things,  How was your day and the like." Pt did not write down everyday sentences. SLP assisted pt with one more sentence - pt was slightly distracted wanting to write down sentences he feels he SHOULD say - req'd min cues occasionally to tihnk of what he WOULD say. After cues for pt to think of things he says each day at work as well, pt generated "Hello, this is Ronald Noble" as one sentence. SLP told pt to focus on this with his wife this weekend. SLP had pt focus on louder and  exaggerated speech by reciting tongue twisters pt brought. Little success in slowing rate with these, so SLP again explained rationale for pt reducing his rate and had him read two sentences of a news article "slower than you think you should read" and cued pt that THAT was WNL rate. Pt read the remainder of the article (8 paragraphs split into 2-paragraph segments) with rare min A for slowing speech. Pt responded best to the cue "It has to feel TOO SLOW to you - then you know it's WNL rate."      Assessment / Recommendations / Plan  Plan  Continue with current plan of care      Progression Toward Goals   Progression toward goals  Progressing toward goals         SLP Short Term Goals - 07/21/19 1440      SLP SHORT TERM GOAL #1   Title  Pt will average low-mid 90sdB with loud /a/ for 3 sessions with rare min A    Time  3    Period  Weeks    Status  On-going      SLP SHORT TERM GOAL #2   Title  Pt will average low-mid 70sdB on structured speech tasks with rare min A for three sessions    Time  3    Period  Weeks    Status  On-going      SLP SHORT TERM GOAL #3   Title  Pt will maintain average low 70s dB over 8 minute simple conversation with rare min A, for 2 sessions    Time  3    Period  Weeks    Status  On-going      SLP SHORT TERM GOAL #4   Title  pt will maintain speech  intelligibility at 95%+ using intelligibility compensations(due to rushed speech) for 8 minutes simple conversation in three sesssions    Time  3    Period  Weeks    Status  On-going       SLP Long Term Goals - 07/21/19 1440      SLP LONG TERM GOAL #1   Title  Pt will maintain low 70sdB over 10 minute mod complex conversation with rare min A in three therapy sessions    Time  7    Period  Weeks   or 17 sessions   Status  On-going      SLP LONG TERM GOAL #2   Title  Pt will achieve 95%+ speech intelligibility outside of tx room (due to rushing speech) over 12 minutes mod complex conversation with modified independence (external cues) in three sessions    Time  7    Period  Weeks    Status  On-going       Plan - 07/21/19 1438    Clinical Impression Statement  Pt loudness WNL today, however intelligibilty was decr'd to require careful listening from SLP. Pt states little difficulty with volume or intelligbility at work due to it being earlier in the day. Although pt does not exhibit sub-normal volume at time of eval, ST is warranted as pt fully endorses loudness decay and reduced intelligibliity later in the day. SLP strongly encouraged pt to schedule ST during the mid to late afternoon so work can be done on his speech clarity when functionally necessary for him. Additionally, pt has history of cognitive-communication deficits even though they were not formally tested today - they may need to be formally tested later in therapy course and addressed.    Speech Therapy Frequency  2x / week    Duration  --   8 weeks or 17 total sessions   Treatment/Interventions  Compensatory techniques;Functional tasks;Cueing hierarchy;Language facilitation;Cognitive reorganization;SLP instruction and feedback;Patient/family education;Internal/external aids    Potential to Achieve Goals  Good       Patient will benefit from skilled therapeutic intervention in order to improve the following deficits and  impairments:   Dysarthria and anarthria  Cognitive communication deficit    Problem List Patient Active Problem List   Diagnosis Date Noted  . Bilateral olecranon bursitis 07/05/2019  .  Other bursitis of knee, left knee 12/06/2018  . Pain in right leg 12/06/2018  . Parkinson disease ([place])     [place] ,[place], [place]  07/21/2019, 2:41 PM  [facility] [address] [place], [name], [address] Phone: [phone]   Fax:  [phone]   Name: Ronald Noble MRN: [id number] Date of Birth: 23-Feb-1946

## 2019-07-25 ENCOUNTER — Ambulatory Visit: Payer: Medicare Other | Admitting: Speech Pathology

## 2019-07-25 ENCOUNTER — Other Ambulatory Visit: Payer: Self-pay

## 2019-07-25 DIAGNOSIS — R41841 Cognitive communication deficit: Secondary | ICD-10-CM | POA: Diagnosis not present

## 2019-07-25 DIAGNOSIS — R471 Dysarthria and anarthria: Secondary | ICD-10-CM

## 2019-07-25 NOTE — Patient Instructions (Signed)
Home exercises  1) Say "ah" with your loud, good quality voice (use your abs). Aim for effort level 7 or 8. Full breath before each "ahh". No rushing!   5 times  2) Read aloud from your list of 10 phrases in your loud, good quality voice. You need to read these SLOWER than you think you need to. Take a breath before each phrase  1. How was your day?  2. What did you do?  3. Good morning.   4. How did you sleep last night?   5. And how did I sleep?  6. Is the coffee ready?  7. Would you like a scrambled egg?  8. Would you like some oatmeal?  9. Would you help me with the jumble?  10. Good morning, Pat.  11. This is [employer] speaking.  12. What's for supper?

## 2019-07-26 NOTE — Therapy (Signed)
[facility] [address] [place], [name], [address] Phone: [phone]   Fax:  [phone]  Speech Language Pathology Treatment  Patient Details  Name: Ronald Noble MRN: [id number] Date of Birth: 08/12/1945 Referring Provider (SLP): Star Age, MD   Encounter Date: 07/25/2019  End of Session - 07/26/19 0852    Visit Number  4    Number of Visits  17    Date for SLP Re-Evaluation  09/25/19    SLP Start Time  [id number]    SLP Stop Time   [id number]    SLP Time Calculation (min)  43 min    Activity Tolerance  Patient tolerated treatment well       Past Medical History:  Diagnosis Date  . BPH (benign prostatic hyperplasia)   . Parkinson disease ([place])   . Skin cancer of face    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  . Sleep apnea    mild  . Spider bite    brown recluse/ left ankle/08/13  . Urethral stricture     Past Surgical History:  Procedure Laterality Date  . BURR HOLE W/ STEREOTACTIC INSERTION OF DBS LEADS / INTRAOP MICROELECTRODE RECORDING  04/2014  . COLONOSCOPY    . CYSTOSCOPY WITH URETHRAL DILATATION N/A 06/15/2019   Procedure: CYSTOSCOPY WITH URETHRAL DILATATION;  Surgeon: Franchot Gallo, MD;  Location: [facility];  Service: Urology;  Laterality: N/A;  30 MINS  . KNEE SURGERY     exploratory/left  . SKIN CANCER EXCISION  01/2014   face  . TRANSURETHRAL RESECTION OF PROSTATE     2008    There were no vitals filed for this visit.  Subjective Assessment - 07/25/19 1704    Subjective  "I always take my pills without water."    Currently in Pain?  No/denies            ADULT SLP TREATMENT - 07/26/19 0847      General Information   Behavior/Cognition  Alert;Cooperative;Pleasant mood      Treatment Provided   Treatment provided  Cognitive-Linquistic      Pain Assessment   Pain Assessment  No/denies pain      Cognitive-Linquistic Treatment   Treatment  focused on  Dysarthria    Skilled Treatment  Based on pt "s" statement, SLP educated pt re: nature of swallowing difficulties with Parkinson's disease and advised pt he should always take medications with something to drink. Pt showed SLP everday sentences he generated with wife's help, which were appropriate. Even with cues for pt to read them "too slowly" as was effective previous session, pt required usual mod cues and modeling to do this. SLP typed sentences for pt and advised he continue daily practice at home. Volume subjectively was mildly degraded this session, and pt had significantly more dyskinesias today than in previous session with this therapist. Rapid rate, hypoarticulation impacting intelligibility primarily for this listener today, at approximately 80% when therapist not actively cuing pt. In structured task (paragraph reading, news article), patient required occasional min-mod A for slower rate, breath pacing.       Assessment / Recommendations / Plan   Plan  Continue with current plan of care      Progression Toward Goals   Progression toward goals  Progressing toward goals       SLP Education - 07/26/19 0852    Education Details  pt needs to read/speak more slowly than he feels is necessary  Person(s) Educated  Patient    Methods  Explanation;Demonstration;Verbal cues    Comprehension  Verbalized understanding;Verbal cues required;Returned demonstration;Need further instruction       SLP Short Term Goals - 07/26/19 0855      SLP SHORT TERM GOAL #1   Title  Pt will average low-mid 90sdB with loud /a/ for 3 sessions with rare min A    Time  2    Period  Weeks    Status  On-going      SLP SHORT TERM GOAL #2   Title  Pt will average low-mid 70sdB on structured speech tasks with rare min A for three sessions    Time  2    Period  Weeks    Status  On-going      SLP SHORT TERM GOAL #3   Title  Pt will maintain average low 70s dB over 8 minute simple conversation with  rare min A, for 2 sessions    Time  2    Period  Weeks    Status  On-going      SLP SHORT TERM GOAL #4   Title  pt will maintain speech intelligibility at 95%+ using intelligibility compensations(due to rushed speech) for 8 minutes simple conversation in three sesssions    Time  2    Period  Weeks    Status  On-going       SLP Long Term Goals - 07/26/19 0855      SLP LONG TERM GOAL #1   Title  Pt will maintain low 70sdB over 10 minute mod complex conversation with rare min A in three therapy sessions    Time  6    Period  Weeks   or 17 sessions   Status  On-going      SLP LONG TERM GOAL #2   Title  Pt will achieve 95%+ speech intelligibility outside of tx room (due to rushing speech) over 12 minutes mod complex conversation with modified independence (external cues) in three sessions    Time  6    Period  Weeks    Status  On-going       Plan - 07/26/19 [id number]    Clinical Impression Statement  Pt loudness mildly decreased today, however intelligibilty was decr'd due to rapid rate and hypoarticulation, to require careful listening from SLP. Pt states little difficulty with volume or intelligbility at work due to it being earlier in the day. Intelligibility notably decreased during 2 most recent late afternoon appointments. Additionally, pt has history of cognitive-communication deficits even though they were not formally tested today - they may need to be formally tested later in therapy course and addressed. Continue skilled ST to address dysarthria and if deemed necessary per eval/pt goals, cognitive communication deficits.    Speech Therapy Frequency  2x / week    Duration  --   8 weeks or 17 total sessions   Treatment/Interventions  Compensatory techniques;Functional tasks;Cueing hierarchy;Language facilitation;Cognitive reorganization;SLP instruction and feedback;Patient/family education;Internal/external aids    Potential to Achieve Goals  Good       Patient will benefit  from skilled therapeutic intervention in order to improve the following deficits and impairments:   Dysarthria and anarthria  Cognitive communication deficit    Problem List Patient Active Problem List   Diagnosis Date Noted  . Bilateral olecranon bursitis 07/05/2019  . Other bursitis of knee, left knee 12/06/2018  . Pain in right leg 12/06/2018  . Parkinson disease [facility])    Stanton Kidney  Rhunette Croft, [place], CCC-SLP Speech-Language Pathologist   Aliene Altes 07/26/2019, 8:56 AM  [facility] [address] [place], [name], [address] Phone: [phone]   Fax:  [phone]   Name: Ronald Noble MRN: [id number] Date of Birth: 30-Mar-1946

## 2019-07-31 ENCOUNTER — Telehealth: Payer: Self-pay

## 2019-07-31 DIAGNOSIS — G4752 REM sleep behavior disorder: Secondary | ICD-10-CM

## 2019-07-31 DIAGNOSIS — G479 Sleep disorder, unspecified: Secondary | ICD-10-CM

## 2019-07-31 NOTE — Telephone Encounter (Signed)
Pt is requesting a refill on his Clonazepam 0.25 mg. [place] drug registry has been verified. Last refill was on 06/26/2019 # 30 for a 30 day supply prescribed by Dr. Rexene Alberts but 2 refills are still on file per the [facility] drug registry.  I have sent notice to [place].

## 2019-08-01 ENCOUNTER — Other Ambulatory Visit: Payer: Self-pay

## 2019-08-01 ENCOUNTER — Ambulatory Visit: Payer: Medicare Other

## 2019-08-01 DIAGNOSIS — R41841 Cognitive communication deficit: Secondary | ICD-10-CM

## 2019-08-01 DIAGNOSIS — R471 Dysarthria and anarthria: Secondary | ICD-10-CM

## 2019-08-01 NOTE — Therapy (Signed)
[facility] [address] [place], [name], [address] Phone: [phone]   Fax:  [phone]  Speech Language Pathology Treatment  Patient Details  Name: Ronald Noble MRN: [id number] Date of Birth: 11-26-1945 Referring Provider (SLP): Star Age, MD   Encounter Date: 08/01/2019  End of Session - 08/01/19 1705    Visit Number  5    Number of Visits  17    Date for SLP Re-Evaluation  09/25/19    SLP Start Time  1535    SLP Stop Time   [id number]    SLP Time Calculation (min)  40 min    Activity Tolerance  Patient tolerated treatment well       Past Medical History:  Diagnosis Date  . BPH (benign prostatic hyperplasia)   . Parkinson disease ([place])   . Skin cancer of face    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  . Sleep apnea    mild  . Spider bite    brown recluse/ left ankle/08/13  . Urethral stricture     Past Surgical History:  Procedure Laterality Date  . BURR HOLE W/ STEREOTACTIC INSERTION OF DBS LEADS / INTRAOP MICROELECTRODE RECORDING  04/2014  . COLONOSCOPY    . CYSTOSCOPY WITH URETHRAL DILATATION N/A 06/15/2019   Procedure: CYSTOSCOPY WITH URETHRAL DILATATION;  Surgeon: Franchot Gallo, MD;  Location: [facility];  Service: Urology;  Laterality: N/A;  30 MINS  . KNEE SURGERY     exploratory/left  . SKIN CANCER EXCISION  01/2014   face  . TRANSURETHRAL RESECTION OF PROSTATE     2008    There were no vitals filed for this visit.  Subjective Assessment - 08/01/19 1543    Subjective  "She will let me know (if I'm too soft)." Little dyskenesias today - last PD 2:10 pm, next dose 5pm. Pt at his peak.    Currently in Pain?  No/denies            ADULT SLP TREATMENT - 08/01/19 1544      General Information   Behavior/Cognition  Alert;Cooperative;Pleasant mood      Treatment Provided   Treatment provided  Cognitive-Linquistic      Cognitive-Linquistic  Treatment   Treatment focused on  Dysarthria    Skilled Treatment  Pt did not recall SLP Bardin's directive last week to take H2O with meds, until SLP asked him directly. Loud /a/ average upper 80s dB. Pt did not bring his sentences today so SLP suggested pt put a copy in his car so that he will have them for ST in order to bring to ST each session. SLP explained rationale for the sentences as pt stated "Some of those sentences will change daily."  In 30-second spans of monologue pt maintained WNL speech 80% of the time with very little articulatory degrading due to rushed speech. Suspect timing of pt PD med plays a large role in amount of progress towards goals.      Assessment / Recommendations / Plan   Plan  Continue with current plan of care      Progression Toward Goals   Progression toward goals  Progressing toward goals       SLP Education - 08/01/19 1705    Education Details  rationale for HEP sentences    Person(s) Educated  Patient    Methods  Explanation    Comprehension  Verbalized understanding       SLP Short Term Goals -  08/01/19 1707      SLP SHORT TERM GOAL #1   Title  Pt will average low-mid 90sdB with loud /a/ for 3 sessions with rare min A    Time  1    Period  Weeks    Status  On-going      SLP SHORT TERM GOAL #2   Title  Pt will average low-mid 70sdB on structured speech tasks with rare min A for three sessions    Time  1    Period  Weeks    Status  On-going      SLP SHORT TERM GOAL #3   Title  Pt will maintain average low 70s dB over 8 minute simple conversation with rare min A, for 2 sessions    Time  1    Period  Weeks    Status  On-going      SLP SHORT TERM GOAL #4   Title  pt will maintain speech intelligibility at 95%+ using intelligibility compensations(due to rushed speech) for 8 minutes simple conversation in three sesssions    Time  1    Period  Weeks    Status  On-going       SLP Long Term Goals - 08/01/19 1709      SLP LONG TERM GOAL  #1   Title  Pt will maintain low 70sdB over 10 minute mod complex conversation with rare min A in three therapy sessions    Time  5    Period  Weeks   or 17 sessions   Status  On-going      SLP LONG TERM GOAL #2   Title  Pt will achieve 95%+ speech intelligibility outside of tx room (due to rushing speech) over 12 minutes mod complex conversation with modified independence (external cues) in three sessions    Time  5    Period  Weeks    Status  On-going       Plan - 08/01/19 1705    Clinical Impression Statement  Pt with 5 ST appointments over 4 weeks - pt did not schedule ST according to SLP recommendation of x2/week - pt scheduled himself once a week ST. This significantly decreases pt's potential to meet STGs and LTGs. Loudness largely WNL today. Pt states little difficulty with volume or intelligbility at work due to it being earlier in the day, and pt reports his wife "lets (him) know if she can't understand me.". Although pt does not exhibit sub-normal volume at time of eval, ST continues warranted as pt fully endorses loudness decay and reduced intelligibliity later in the day. Pt has history of cognitive-communication deficits, and these may need to be formally tested later in therapy course and addressed.    Speech Therapy Frequency  2x / week    Duration  --   8 weeks or 17 total sessions   Treatment/Interventions  Compensatory techniques;Functional tasks;Cueing hierarchy;Language facilitation;Cognitive reorganization;SLP instruction and feedback;Patient/family education;Internal/external aids    Potential to Achieve Goals  Fair    Potential Considerations  Cooperation/participation level       Patient will benefit from skilled therapeutic intervention in order to improve the following deficits and impairments:   Dysarthria and anarthria  Cognitive communication deficit    Problem List Patient Active Problem List   Diagnosis Date Noted  . Bilateral olecranon bursitis  07/05/2019  . Other bursitis of knee, left knee 12/06/2018  . Pain in right leg 12/06/2018  . Parkinson disease ([place])  [facility] ,[place], [place]  08/01/2019, 5:15 PM  [facility] [address] [place], [name], [address] Phone: [phone]   Fax:  [phone]   Name: Ronald Noble MRN: [id number] Date of Birth: 1945-08-20

## 2019-08-08 ENCOUNTER — Other Ambulatory Visit: Payer: Self-pay

## 2019-08-08 ENCOUNTER — Ambulatory Visit: Payer: Medicare Other | Attending: Orthopaedic Surgery | Admitting: Speech Pathology

## 2019-08-08 DIAGNOSIS — R41841 Cognitive communication deficit: Secondary | ICD-10-CM | POA: Insufficient documentation

## 2019-08-08 DIAGNOSIS — R471 Dysarthria and anarthria: Secondary | ICD-10-CM | POA: Insufficient documentation

## 2019-08-08 NOTE — Patient Instructions (Signed)
Keep doing your "ahs" and everyday sentences. When you read the paper, read one article out loud every day. Practice taking those slow, deep breaths beforehand. Put your hand on your belly and make your exhale match your inhale. Count to 5, SLOWLY to yourself. Then, keep your hand on your belly to remind you of this slow, deep, full breath while you're reading.

## 2019-08-08 NOTE — Therapy (Signed)
[facility] [address] [place], [name], [address] Phone: [phone]   Fax:  [phone]  Speech Language Pathology Treatment  Patient Details  Name: Ronald Noble MRN: [id number] Date of Birth: 06/21/1945 Referring Provider (SLP): Star Age, MD   Encounter Date: 08/08/2019  End of Session - 08/08/19 1751    Visit Number  6    Number of Visits  17    Date for SLP Re-Evaluation  09/25/19    SLP Start Time  4536    SLP Stop Time   4680    SLP Time Calculation (min)  46 min    Activity Tolerance  Patient tolerated treatment well       Past Medical History:  Diagnosis Date  . BPH (benign prostatic hyperplasia)   . Parkinson disease ([place])   . Skin cancer of face    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  . Sleep apnea    mild  . Spider bite    brown recluse/ left ankle/08/13  . Urethral stricture     Past Surgical History:  Procedure Laterality Date  . BURR HOLE W/ STEREOTACTIC INSERTION OF DBS LEADS / INTRAOP MICROELECTRODE RECORDING  04/2014  . COLONOSCOPY    . CYSTOSCOPY WITH URETHRAL DILATATION N/A 06/15/2019   Procedure: CYSTOSCOPY WITH URETHRAL DILATATION;  Surgeon: Franchot Gallo, MD;  Location: [facility];  Service: Urology;  Laterality: N/A;  30 MINS  . KNEE SURGERY     exploratory/left  . SKIN CANCER EXCISION  01/2014   face  . TRANSURETHRAL RESECTION OF PROSTATE     2008    There were no vitals filed for this visit.  Subjective Assessment - 08/08/19 1708    Subjective  "I'm doing the ahhs when I drive to work and back."    Currently in Pain?  No/denies            ADULT SLP TREATMENT - 08/08/19 1818      General Information   Behavior/Cognition  Alert;Cooperative;Pleasant mood      Treatment Provided   Treatment provided  Cognitive-Linquistic      Pain Assessment   Pain Assessment  No/denies pain      Cognitive-Linquistic Treatment   Treatment focused on  Dysarthria    Skilled Treatment  Pt with shallow breathing during initial conversation with SLP, loudness was [facility] however pt with rapid rate of speech which degraded intelligibility to approx 95%. SLP worked with pt on abdominal breathing and then integrated loud /a/ (avg low 90s dB)and structured speech tasks with AB (75dB, 72dB with cog load). In simple conversation with modeling of rate and breathing, and pt using tactile cue of hand on abdomen, pt required occasional mod cues for awareness/correction of shallow inspiration or rushed utteranced. Self-corrected x 2. Pt told SLP he reads NYT daily; SLP printed article and had pt read this aloud, with occasional min-mod cues for pauses, breath support. SLP told pt to read an article out loud daily in addition to his HEP practice. Pt was due for medication dose at 6pm. Minimal dyskinesias noted this session.       Assessment / Recommendations / Plan   Plan  Continue with current plan of care      Progression Toward Goals   Progression toward goals  Progressing toward goals   slow progress; pt has agreed to schedule 2x per week      SLP Education - 08/08/19 1819  Education Details  need to commit to more frequent attendance to meet goals    Person(s) Educated  Patient    Methods  Explanation    Comprehension  Verbalized understanding       SLP Short Term Goals - 08/08/19 1800      SLP SHORT TERM GOAL #1   Title  Pt will average low-mid 90sdB with loud /a/ for 3 sessions with rare min A    Time  1    Period  Weeks    Status  Partially Met      SLP SHORT TERM GOAL #2   Title  Pt will average low-mid 70sdB on structured speech tasks with rare min A for three sessions    Baseline  08/08/19    Time  1    Period  Weeks    Status  On-going      SLP SHORT TERM GOAL #3   Title  Pt will maintain average low 70s dB over 8 minute simple conversation with rare min A, for 2 sessions    Time  1    Period  Weeks    Status   On-going      SLP SHORT TERM GOAL #4   Title  pt will maintain speech intelligibility at 95%+ using intelligibility compensations(due to rushed speech) for 8 minutes simple conversation in three sesssions    Time  1    Period  Weeks    Status  On-going       SLP Long Term Goals - 08/08/19 1800      SLP LONG TERM GOAL #1   Title  Pt will maintain low 70sdB over 10 minute mod complex conversation with rare min A in three therapy sessions    Time  5    Period  Weeks   or 17 sessions   Status  On-going      SLP LONG TERM GOAL #2   Title  Pt will achieve 95%+ speech intelligibility outside of tx room (due to rushing speech) over 12 minutes mod complex conversation with modified independence (external cues) in three sessions    Time  5    Period  Weeks    Status  On-going       Plan - 08/08/19 1754    Clinical Impression Statement  SLP told pt today that potential to meet STGs/LTGs decreased without pt attending at recommended frequency of 2x per week. Pt agreed he would like to continue and scheduled 2x per week for the next for weeks. Loudness largely WNL today, although pt speaking on functional residual capacity, with short rushes of speech intermittently. He reports decreased intelligibility at the end of the day; increased dyskinesias and intelligibility noted more frequently during late afternoon (5pm) appointments, depending on when pt takes his medications. I recommend skilled ST to maximize intelligibility for conversations at home and work. Patient has a history of cognitive-communication deficits; will defer formal assessment at this time for focus on pt's intelligibility concerns.    Speech Therapy Frequency  2x / week    Duration  --   8 weeks or 17 total sessions   Treatment/Interventions  Compensatory techniques;Functional tasks;Cueing hierarchy;Language facilitation;Cognitive reorganization;SLP instruction and feedback;Patient/family education;Internal/external aids     Potential to Achieve Goals  Fair    Potential Considerations  Cooperation/participation level       Patient will benefit from skilled therapeutic intervention in order to improve the following deficits and impairments:   Dysarthria and anarthria  Cognitive  communication deficit    Problem List Patient Active Problem List   Diagnosis Date Noted  . Bilateral olecranon bursitis 07/05/2019  . Other bursitis of knee, left knee 12/06/2018  . Pain in right leg 12/06/2018  . Parkinson disease [facility])    Deneise Lever, [place], [place] 08/08/2019, 6:22 PM  [facility] [address] [place], [name], [address] Phone: [phone]   Fax:  [phone]   Name: Ronald Noble MRN: [id number] Date of Birth: 12-22-1945

## 2019-08-10 ENCOUNTER — Other Ambulatory Visit: Payer: Self-pay

## 2019-08-10 ENCOUNTER — Ambulatory Visit: Payer: Medicare Other | Admitting: Speech Pathology

## 2019-08-10 DIAGNOSIS — R471 Dysarthria and anarthria: Secondary | ICD-10-CM

## 2019-08-10 DIAGNOSIS — R41841 Cognitive communication deficit: Secondary | ICD-10-CM | POA: Diagnosis not present

## 2019-08-10 NOTE — Patient Instructions (Signed)
Abdominal Breathing   First, lie down on your back with a plastic cup or something lightweight on your belly. Breathe in and feel the expansion in your belly. It should rise with your inhale and relax/fall back with your exhale. Just pay attention and notice, don't try to to control it. Once you feel you have the rhythm, sit up on the side of the bed and try to keep it going.   . Shoulders down and back . Place your hand on your abdomen - this helps you focus on easy abdominal breath support - the best and most relaxed way to breathe . Breathe in through your nose and fill your belly with air, watching your hand move outward . Breathe out through your mouth and watch your belly move in. An audible "sh"  may help   Think of your belly as a balloon, when you fill with air (inhale), the balloon gets bigger. As the air goes out (exhale), the balloon deflates.  Practice breathing in and out in front of a mirror, watching your belly Breathe in for a count of 5 and breathe out for a count of 5  Once you have this down, you can add some speech.  1. Full breath, then your "ahhhs." 2. Try counting, 2-3 numbers per breath.  3. Read some of the phrases and sentences Glendell Docker gave you. Remember, full breath/expansion before each one.

## 2019-08-10 NOTE — Therapy (Deleted)
[facility] [address] [place], [name], [address] Phone: [phone]   Fax:  [phone]  Aug 10, 2019   No Recipients  Speech Language Pathology Therapy Discharge Summary   Patient: Ronald Noble  MRN: [id number]  Date of Birth: 01-Jul-1945   Diagnosis: Dysarthria and anarthria  Cognitive communication deficit Referring Provider (SLP): Star Age, MD   The above patient had been seen in Speech Language Pathology *** times of *** treatments scheduled with *** no shows and *** cancellations.  The treatment consisted of *** The patient is: {improved/worse/unchanged:[id number]}  Subjective: ***  Discharge Findings: ***  Functional Status at Discharge: ***  [id number]  Plan - 08/10/19 1825    Clinical Impression Statement  Loudness largely WNL today; trained pt in abdominal breathing to increase respiratory support for vocal quality and slower rate. With focus on breath support and pacing, patient with some carryover to simple conversation today. In general pt reports decreased intelligibility at the end of the day; increased dyskinesias and intelligibility noted more frequently during late afternoon (5pm) appointments, depending on when pt takes his medications. I recommend skilled ST to maximize intelligibility for conversations at home and work. Patient has a history of cognitive-communication deficits; will defer formal assessment at this time for focus on pt's intelligibility concerns.    Speech Therapy Frequency  2x / week    Duration  --   8 weeks or 17 total sessions   Treatment/Interventions  Compensatory techniques;Functional tasks;Cueing hierarchy;Language facilitation;Cognitive reorganization;SLP instruction and feedback;Patient/family education;Internal/external aids    Potential to Achieve Goals  Fair    Potential Considerations  Cooperation/participation level           Sincerely,   Aliene Altes, CCC-SLP    CC No Recipients  [facility] [address] [place], [name], [address] Phone: [phone]   Fax:  [phone]

## 2019-08-10 NOTE — Therapy (Signed)
[facility] [address] [place], [name], [address] Phone: [phone]   Fax:  [phone]  Speech Language Pathology Treatment  Patient Details  Name: Ronald Noble MRN: [id number] Date of Birth: December 23, 1945 Referring Provider (SLP): Star Age, MD   Encounter Date: 08/10/2019  End of Session - 08/10/19 1824    Visit Number  7    Number of Visits  17    Date for SLP Re-Evaluation  09/25/19    SLP Start Time  1703    SLP Stop Time   1744    SLP Time Calculation (min)  41 min    Activity Tolerance  Patient tolerated treatment well       Past Medical History:  Diagnosis Date  . BPH (benign prostatic hyperplasia)   . Parkinson disease ([place])   . Skin cancer of face    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  . Sleep apnea    mild  . Spider bite    brown recluse/ left ankle/08/13  . Urethral stricture     Past Surgical History:  Procedure Laterality Date  . BURR HOLE W/ STEREOTACTIC INSERTION OF DBS LEADS / INTRAOP MICROELECTRODE RECORDING  04/2014  . COLONOSCOPY    . CYSTOSCOPY WITH URETHRAL DILATATION N/A 06/15/2019   Procedure: CYSTOSCOPY WITH URETHRAL DILATATION;  Surgeon: Franchot Gallo, MD;  Location: [facility];  Service: Urology;  Laterality: N/A;  30 MINS  . KNEE SURGERY     exploratory/left  . SKIN CANCER EXCISION  01/2014   face  . TRANSURETHRAL RESECTION OF PROSTATE     2008    There were no vitals filed for this visit.  Subjective Assessment - 08/10/19 1704    Subjective  "My wife is convinced it's a matter of posture and breathing."    Currently in Pain?  No/denies            ADULT SLP TREATMENT - 08/10/19 1705      General Information   Behavior/Cognition  Alert;Cooperative;Pleasant mood      Treatment Provided   Treatment provided  Cognitive-Linquistic      Pain Assessment   Pain Assessment  No/denies pain      Cognitive-Linquistic  Treatment   Treatment focused on  Dysarthria    Skilled Treatment  SLP worked with pt on abdominal breathing; with verbal cues and demonstration pt maintained shallow, chest-centered breathing. SLP took patient to therapy mat to train in supine position. Required mod cues upon sitting upright after training in supine for 5 min. Integrated automatic speech tasks and progressed to phrases, sentences with cognitive load gradually with occasional mod cues (cue for "full expansion" was helpful for pt. After training breath and pacing, able to transition to simple conversation with carryover of slower rate; occasional cues necessary for full breath. Patient took med at 5:40 (with water provided at beginning of session).       Assessment / Recommendations / Plan   Plan  Continue with current plan of care      Progression Toward Goals   Progression toward goals  Progressing toward goals       SLP Education - 08/10/19 1824    Education Details  abdominal breathing    Person(s) Educated  Patient    Methods  Explanation    Comprehension  Verbalized understanding       SLP Short Term Goals - 08/10/19 1827      SLP SHORT TERM GOAL #1  Title  Pt will average low-mid 90sdB with loud /a/ for 3 sessions with rare min A    Time  1    Period  Weeks    Status  Partially Met      SLP SHORT TERM GOAL #2   Title  Pt will average low-mid 70sdB on structured speech tasks with rare min A for three sessions    Baseline  08/08/19    Time  1    Period  Weeks    Status  On-going      SLP SHORT TERM GOAL #3   Title  Pt will maintain average low 70s dB over 8 minute simple conversation with rare min A, for 2 sessions    Time  1    Period  Weeks    Status  On-going      SLP SHORT TERM GOAL #4   Title  pt will maintain speech intelligibility at 95%+ using intelligibility compensations(due to rushed speech) for 8 minutes simple conversation in three sesssions    Time  1    Period  Weeks    Status  On-going        SLP Long Term Goals - 08/10/19 1827      SLP LONG TERM GOAL #1   Title  Pt will maintain low 70sdB over 10 minute mod complex conversation with rare min A in three therapy sessions    Time  5    Period  Weeks   or 17 sessions   Status  On-going      SLP LONG TERM GOAL #2   Title  Pt will achieve 95%+ speech intelligibility outside of tx room (due to rushing speech) over 12 minutes mod complex conversation with modified independence (external cues) in three sessions    Time  5    Period  Weeks    Status  On-going       Plan - 08/10/19 1825    Clinical Impression Statement  Loudness largely WNL today; trained pt in abdominal breathing to increase respiratory support for vocal quality and slower rate. With focus on breath support and pacing, patient with some carryover to simple conversation today. In general pt reports decreased intelligibility at the end of the day; increased dyskinesias and intelligibility noted more frequently during late afternoon (5pm) appointments, depending on when pt takes his medications. I recommend skilled ST to maximize intelligibility for conversations at home and work. Patient has a history of cognitive-communication deficits; will defer formal assessment at this time for focus on pt's intelligibility concerns.    Speech Therapy Frequency  2x / week    Duration  --   8 weeks or 17 total sessions   Treatment/Interventions  Compensatory techniques;Functional tasks;Cueing hierarchy;Language facilitation;Cognitive reorganization;SLP instruction and feedback;Patient/family education;Internal/external aids    Potential to Achieve Goals  Fair    Potential Considerations  Cooperation/participation level       Patient will benefit from skilled therapeutic intervention in order to improve the following deficits and impairments:   Dysarthria and anarthria  Cognitive communication deficit    Problem List Patient Active Problem List   Diagnosis Date Noted   . Bilateral olecranon bursitis 07/05/2019  . Other bursitis of knee, left knee 12/06/2018  . Pain in right leg 12/06/2018  . Parkinson disease [facility])    Deneise Lever, MS, [place] 08/10/2019, 6:27 PM  [facility] [address] [place], [name], [address] Phone:  [phone]   Fax:  [phone]   Name: Joe Gee MRN: [id number] Date of Birth: 1946-02-18

## 2019-08-15 ENCOUNTER — Ambulatory Visit: Payer: Medicare Other

## 2019-08-17 ENCOUNTER — Ambulatory Visit: Payer: Medicare Other | Admitting: Speech Pathology

## 2019-08-17 ENCOUNTER — Other Ambulatory Visit: Payer: Self-pay

## 2019-08-17 DIAGNOSIS — R41841 Cognitive communication deficit: Secondary | ICD-10-CM

## 2019-08-17 DIAGNOSIS — R471 Dysarthria and anarthria: Secondary | ICD-10-CM | POA: Diagnosis not present

## 2019-08-17 NOTE — Therapy (Signed)
[facility] [address] [place], [name], [address] Phone: [phone]   Fax:  [phone]  Speech Language Pathology Treatment  Patient Details  Name: Ronald Noble MRN: [id number] Date of Birth: 06-21-1945 Referring Provider (SLP): Star Age, MD   Encounter Date: 08/17/2019  End of Session - 08/17/19 1821    Visit Number  8    Number of Visits  17    Date for SLP Re-Evaluation  09/25/19    SLP Start Time  1619    SLP Stop Time   1700    SLP Time Calculation (min)  41 min    Activity Tolerance  Patient tolerated treatment well       Past Medical History:  Diagnosis Date  . BPH (benign prostatic hyperplasia)   . Parkinson disease ([place])   . Skin cancer of face    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  . Sleep apnea    mild  . Spider bite    brown recluse/ left ankle/08/13  . Urethral stricture     Past Surgical History:  Procedure Laterality Date  . BURR HOLE W/ STEREOTACTIC INSERTION OF DBS LEADS / INTRAOP MICROELECTRODE RECORDING  04/2014  . COLONOSCOPY    . CYSTOSCOPY WITH URETHRAL DILATATION N/A 06/15/2019   Procedure: CYSTOSCOPY WITH URETHRAL DILATATION;  Surgeon: Franchot Gallo, MD;  Location: [facility];  Service: Urology;  Laterality: N/A;  30 MINS  . KNEE SURGERY     exploratory/left  . SKIN CANCER EXCISION  01/2014   face  . TRANSURETHRAL RESECTION OF PROSTATE     2008    There were no vitals filed for this visit.  Subjective Assessment - 08/17/19 1626    Subjective  "It was a disaster," re: practice with wife last night.    Currently in Pain?  No/denies            ADULT SLP TREATMENT - 08/17/19 1627      General Information   Behavior/Cognition  Alert;Cooperative;Pleasant mood      Treatment Provided   Treatment provided  Cognitive-Linquistic      Pain Assessment   Pain Assessment  No/denies pain      Cognitive-Linquistic Treatment    Treatment focused on  Dysarthria    Skilled Treatment  Patient took med at 3:30, approx 1 hour prior to session. Vocal intensity was WNL for most of session. Abdominal breathing at rest 100% accuracy, in sentences 95% accuracy. Averaged 73dB in structured speech tasks. Required occasional mod cues for slower rate in sentences with cognitive load. Multiparagraph reading with occasional mod cues for slower rate, breath support. In simple conversation (8 minutes) SLP modeled slow rate and pt conversed with SLP maintaining average dB in low 70s independently, min cues for slower rate.      Assessment / Recommendations / Plan   Plan  Continue with current plan of care      Progression Toward Goals   Progression toward goals  Progressing toward goals         SLP Short Term Goals - 08/17/19 1633      SLP SHORT TERM GOAL #1   Title  Pt will average low-mid 90sdB with loud /a/ for 3 sessions with rare min A    Time  1    Period  Weeks    Status  Partially Met      SLP SHORT TERM GOAL #2   Title  Pt will average  low-mid 70sdB on structured speech tasks with rare min A for three sessions    Baseline  08/08/19 08/17/19    Time  1    Period  Weeks    Status  Partially Met      SLP SHORT TERM GOAL #3   Title  Pt will maintain average low 70s dB over 8 minute simple conversation with rare min A, for 2 sessions    Baseline  08/17/19    Time  1    Period  Weeks    Status  Partially Met      SLP SHORT TERM GOAL #4   Title  pt will maintain speech intelligibility at 95%+ using intelligibility compensations(due to rushed speech) for 8 minutes simple conversation in three sesssions    Time  1    Period  Weeks    Status  Not Met       SLP Long Term Goals - 08/17/19 1633      SLP LONG TERM GOAL #1   Title  Pt will maintain low 70sdB over 10 minute mod complex conversation with rare min A in three therapy sessions    Time  4    Period  Weeks   or 17 sessions   Status  On-going      SLP  LONG TERM GOAL #2   Title  Pt will achieve 95%+ speech intelligibility outside of tx room (due to rushing speech) over 12 minutes mod complex conversation with modified independence (external cues) in three sessions    Time  4    Period  Weeks    Status  On-going       Plan - 08/17/19 1822    Clinical Impression Statement  Loudness largely WNL today; continued training with abdominal breathing to increase respiratory support for vocal quality and slower rate. With focus on breath support and pacing, patient with some carryover to simple conversation today. In general pt reports decreased intelligibility at the end of the day; increased dyskinesias and intelligibility noted more frequently during late afternoon (5pm) appointments, depending on when pt takes his medications. I recommend skilled ST to maximize intelligibility for conversations at home and work. Patient has a history of cognitive-communication deficits; will defer formal assessment at this time for focus on pt's intelligibility concerns.    Speech Therapy Frequency  2x / week    Duration  --   8 weeks or 17 total sessions   Treatment/Interventions  Compensatory techniques;Functional tasks;Cueing hierarchy;Language facilitation;Cognitive reorganization;SLP instruction and feedback;Patient/family education;Internal/external aids    Potential to Achieve Goals  Fair    Potential Considerations  Cooperation/participation level       Patient will benefit from skilled therapeutic intervention in order to improve the following deficits and impairments:   Dysarthria and anarthria  Cognitive communication deficit    Problem List Patient Active Problem List   Diagnosis Date Noted  . Bilateral olecranon bursitis 07/05/2019  . Other bursitis of knee, left knee 12/06/2018  . Pain in right leg 12/06/2018  . Parkinson disease [facility])    Deneise Lever, [place], [place] 08/17/2019, 6:23  PM  [facility] [address] [place], [name], [address] Phone: [phone]   Fax:  [phone]   Name: Ronald Noble MRN: [id number] Date of Birth: 10/01/1945

## 2019-08-22 ENCOUNTER — Other Ambulatory Visit: Payer: Self-pay

## 2019-08-22 ENCOUNTER — Ambulatory Visit: Payer: Medicare Other

## 2019-08-22 DIAGNOSIS — R41841 Cognitive communication deficit: Secondary | ICD-10-CM

## 2019-08-22 DIAGNOSIS — R471 Dysarthria and anarthria: Secondary | ICD-10-CM | POA: Diagnosis not present

## 2019-08-22 NOTE — Therapy (Signed)
[facility] [address] [place], [name], [address] Phone: [phone]   Fax:  [phone]  Speech Language Pathology Treatment  Patient Details  Name: Ronald Noble MRN: [id number] Date of Birth: Sep 24, 1945 Referring Provider (SLP): Star Age, MD   Encounter Date: 08/22/2019  End of Session - 08/22/19 1727    Visit Number  9    Number of Visits  17    Date for SLP Re-Evaluation  09/25/19    SLP Start Time  1533    SLP Stop Time   1613    SLP Time Calculation (min)  40 min    Activity Tolerance  Patient tolerated treatment well       Past Medical History:  Diagnosis Date  . BPH (benign prostatic hyperplasia)   . Parkinson disease ([place])   . Skin cancer of face    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  . Sleep apnea    mild  . Spider bite    brown recluse/ left ankle/08/13  . Urethral stricture     Past Surgical History:  Procedure Laterality Date  . BURR HOLE W/ STEREOTACTIC INSERTION OF DBS LEADS / INTRAOP MICROELECTRODE RECORDING  04/2014  . COLONOSCOPY    . CYSTOSCOPY WITH URETHRAL DILATATION N/A 06/15/2019   Procedure: CYSTOSCOPY WITH URETHRAL DILATATION;  Surgeon: Franchot Gallo, MD;  Location: [facility];  Service: Urology;  Laterality: N/A;  30 MINS  . KNEE SURGERY     exploratory/left  . SKIN CANCER EXCISION  01/2014   face  . TRANSURETHRAL RESECTION OF PROSTATE     2008    There were no vitals filed for this visit.  Subjective Assessment - 08/22/19 1536    Subjective  "I get mixed reviews." (pt, re: practicing with his wife)    Currently in Pain?  No/denies            ADULT SLP TREATMENT - 08/22/19 1538      General Information   Behavior/Cognition  Alert;Cooperative;Pleasant mood      Treatment Provided   Treatment provided  Cognitive-Linquistic      Cognitive-Linquistic Treatment   Treatment focused on  Dysarthria    Skilled  Treatment  (self care/home management 11 minutes) Last med at 3pm "I took it about an hour late today. When I'm feeling good I don't think about taking it." SLP talked with pt about dosage with PD meds are being timed to attempt to regulate on and off times to more middle instead of wide shifts.  Pt walked into room with mild mumbling. (speech tx individual) Pt began with loud /a/ with cues necessary for loudness x1, then pt remained at higher volumes x3. Structured speech sentence tasks produced without SLP difficulty hearing pt in min noisy environment, abdominal breathing (AB) completd 90% success. In 2 -3 sentence responses pt req'd min A x2 for breath support and occasional mod cues for slowed rate with mod complex responses. Pt read 5-7 sentence paragraphs with mod A from SLP occasionally to slow rate.       Assessment / Recommendations / Plan   Plan  Continue with current plan of care      Progression Toward Goals   Progression toward goals  Progressing toward goals       SLP Education - 08/22/19 1727    Education Details  important to time PD meds the same each day    Person(s) Educated  Patient  Methods  Explanation    Comprehension  Verbalized understanding       SLP Short Term Goals - 08/17/19 1633      SLP SHORT TERM GOAL #1   Title  Pt will average low-mid 90sdB with loud /a/ for 3 sessions with rare min A    Time  1    Period  Weeks    Status  Partially Met      SLP SHORT TERM GOAL #2   Title  Pt will average low-mid 70sdB on structured speech tasks with rare min A for three sessions    Baseline  08/08/19 08/17/19    Time  1    Period  Weeks    Status  Partially Met      SLP SHORT TERM GOAL #3   Title  Pt will maintain average low 70s dB over 8 minute simple conversation with rare min A, for 2 sessions    Baseline  08/17/19    Time  1    Period  Weeks    Status  Partially Met      SLP SHORT TERM GOAL #4   Title  pt will maintain speech intelligibility at 95%+  using intelligibility compensations(due to rushed speech) for 8 minutes simple conversation in three sesssions    Time  1    Period  Weeks    Status  Not Met       SLP Long Term Goals - 08/22/19 1729      SLP LONG TERM GOAL #1   Title  Pt will maintain low 70sdB over 10 minute mod complex conversation with rare min A in three therapy sessions    Time  3    Period  Weeks   or 17 sessions   Status  On-going      SLP LONG TERM GOAL #2   Title  Pt will achieve 95%+ speech intelligibility outside of tx room (due to rushing speech) over 12 minutes mod complex conversation with modified independence (external cues) in three sessions    Time  3    Period  Weeks    Status  On-going       Plan - 08/22/19 1728    Clinical Impression Statement  Loudness largely WNL today; continued training with abdominal breathing to increase respiratory support for vocal quality and slower rate. With focus on breath support and pacing, patient continued with occasional carryover to simple conversation today. SLP talked with pt today about the importance of timing his PD meds the same each day, at the recommended intervals and explained rationale for this (pt stated he was 1 hour late with his meds today because he "felt good".) I recommend skilled ST to maximize intelligibility for conversations at home and work. Patient has a history of cognitive-communication deficits; will defer formal assessment at this time for focus on pt's intelligibility concerns.    Speech Therapy Frequency  2x / week    Duration  --   8 weeks or 17 total sessions   Treatment/Interventions  Compensatory techniques;Functional tasks;Cueing hierarchy;Language facilitation;Cognitive reorganization;SLP instruction and feedback;Patient/family education;Internal/external aids    Potential to Achieve Goals  Fair    Potential Considerations  Cooperation/participation level       Patient will benefit from skilled therapeutic intervention in  order to improve the following deficits and impairments:   Dysarthria and anarthria  Cognitive communication deficit    Problem List Patient Active Problem List   Diagnosis Date Noted  . Bilateral olecranon  bursitis 07/05/2019  . Other bursitis of knee, left knee 12/06/2018  . Pain in right leg 12/06/2018  . Parkinson disease ([place])     [place] ,[place], [place]  08/22/2019, 5:30 PM  [facility] [address] [place], [name], [address] Phone: [phone]   Fax:  [phone]   Name: Austen Oyster MRN: [id number] Date of Birth: [birth year]

## 2019-08-24 ENCOUNTER — Ambulatory Visit: Payer: Medicare Other | Admitting: Speech Pathology

## 2019-08-24 ENCOUNTER — Other Ambulatory Visit: Payer: Self-pay

## 2019-08-24 DIAGNOSIS — R41841 Cognitive communication deficit: Secondary | ICD-10-CM | POA: Diagnosis not present

## 2019-08-24 DIAGNOSIS — R471 Dysarthria and anarthria: Secondary | ICD-10-CM | POA: Diagnosis not present

## 2019-08-24 NOTE — Therapy (Signed)
[facility] [address] [place], [name], [address] Phone: [phone]   Fax:  [phone]  Speech Language Pathology Treatment and Progress Note Patient Details  Name: Ronald Noble MRN: [id number] Date of Birth: [birth year] Referring Provider (SLP): Star Age, MD   Encounter Date: 08/24/2019  End of Session - 08/24/19 0933    Visit Number  10    Number of Visits  17    Date for SLP Re-Evaluation  09/25/19    SLP Start Time  0848    SLP Stop Time   0930    SLP Time Calculation (min)  42 min    Activity Tolerance  Patient tolerated treatment well       Past Medical History:  Diagnosis Date  . BPH (benign prostatic hyperplasia)   . Parkinson disease ([place])   . Skin cancer of face    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  . Sleep apnea    mild  . Spider bite    brown recluse/ left ankle/08/13  . Urethral stricture     Past Surgical History:  Procedure Laterality Date  . BURR HOLE W/ STEREOTACTIC INSERTION OF DBS LEADS / INTRAOP MICROELECTRODE RECORDING  04/2014  . COLONOSCOPY    . CYSTOSCOPY WITH URETHRAL DILATATION N/A 06/15/2019   Procedure: CYSTOSCOPY WITH URETHRAL DILATATION;  Surgeon: Franchot Gallo, MD;  Location: [facility];  Service: Urology;  Laterality: N/A;  30 MINS  . KNEE SURGERY     exploratory/left  . SKIN CANCER EXCISION  01/2014   face  . TRANSURETHRAL RESECTION OF PROSTATE     2008    There were no vitals filed for this visit.  Subjective Assessment - 08/24/19 0851    Subjective  "I'm curious if you see a difference between morning and afternoon."    Currently in Pain?  No/denies            ADULT SLP TREATMENT - 08/24/19 0851      General Information   Behavior/Cognition  Alert;Cooperative;Pleasant mood      Treatment Provided   Treatment provided  Cognitive-Linquistic      Pain Assessment   Pain Assessment  No/denies pain       Cognitive-Linquistic Treatment   Treatment focused on  Dysarthria    Skilled Treatment  Pt required cues initially for posture, abdominal breathing. Once awareness drawn to this accuracy was 95% at rest over 3 minute period. Loud /a/ with AB. Pt generated sentences with cognitive load (2 multisyllabic words), with occasional min-mod A for breath pacing, overarticulation. SLP paused pt periodically to refocus on abdominal breathing in isolation. Pt then made second attempts at sentences when he noticed shallow breathing about 75% of the time. In conversation pt required occasional mod cues for deeper breathing.      Assessment / Recommendations / Plan   Plan  Continue with current plan of care      Progression Toward Goals   Progression toward goals  Progressing toward goals         SLP Short Term Goals - 08/24/19 0934      SLP SHORT TERM GOAL #1   Title  Pt will average low-mid 90sdB with loud /a/ for 3 sessions with rare min A    Time  1    Period  Weeks    Status  Partially Met      SLP SHORT TERM GOAL #2   Title  Pt will average  low-mid 70sdB on structured speech tasks with rare min A for three sessions    Baseline  08/08/19 08/17/19    Time  1    Period  Weeks    Status  Partially Met      SLP SHORT TERM GOAL #3   Title  Pt will maintain average low 70s dB over 8 minute simple conversation with rare min A, for 2 sessions    Baseline  08/17/19    Time  1    Period  Weeks    Status  Partially Met      SLP SHORT TERM GOAL #4   Title  pt will maintain speech intelligibility at 95%+ using intelligibility compensations(due to rushed speech) for 8 minutes simple conversation in three sesssions    Time  1    Period  Weeks    Status  Not Met       SLP Long Term Goals - 08/24/19 0934      SLP LONG TERM GOAL #1   Title  Pt will maintain low 70sdB over 10 minute mod complex conversation with rare min A in three therapy sessions    Baseline  08/24/19    Time  2    Period  Weeks    or 17 sessions   Status  On-going      SLP LONG TERM GOAL #2   Title  Pt will achieve 95%+ speech intelligibility outside of tx room (due to rushing speech) over 12 minutes mod complex conversation with modified independence (external cues) in three sessions    Time  3    Period  Weeks    Status  On-going       Plan - 08/24/19 6222    Clinical Impression Statement  Loudness largely WNL today; continued training with abdominal breathing to increase respiratory support for vocal quality and slower rate. With focus on breath support and pacing, patient continued with occasional carryover to simple conversation today. Anticipate d/c in next 1-2 sessions. I recommend skilled ST to maximize intelligibility for conversations at home and work. Patient has a history of cognitive-communication deficits; will defer formal assessment at this time for focus on pt's intelligibility concerns.    Speech Therapy Frequency  2x / week    Duration  --   8 weeks or 17 total sessions   Treatment/Interventions  Compensatory techniques;Functional tasks;Cueing hierarchy;Language facilitation;Cognitive reorganization;SLP instruction and feedback;Patient/family education;Internal/external aids    Potential to Achieve Goals  Fair    Potential Considerations  Cooperation/participation level       Patient will benefit from skilled therapeutic intervention in order to improve the following deficits and impairments:   Dysarthria and anarthria  Cognitive communication deficit    Problem List Patient Active Problem List   Diagnosis Date Noted  . Bilateral olecranon bursitis 07/05/2019  . Other bursitis of knee, left knee 12/06/2018  . Pain in right leg 12/06/2018  . Parkinson disease [facility])    Speech Therapy Progress Note  Dates of Reporting Period: 06/27/19 to 08/24/19  Patient has been seen for 10 speech therapy sessions targeting dysarthria. See goals and clinical impressions above for details.  Deneise Lever, [place], CCC-SLP Speech-Language Pathologist   Aliene Altes 08/24/2019, 9:34 AM  [facility] [address] [place], [name], [address] Phone: [phone]   Fax:  [phone]   Name: Ronald Noble MRN: [id number] Date of Birth: May 03, 1945

## 2019-08-29 ENCOUNTER — Other Ambulatory Visit: Payer: Self-pay

## 2019-08-29 ENCOUNTER — Ambulatory Visit: Payer: Medicare Other | Admitting: Speech Pathology

## 2019-08-29 DIAGNOSIS — R471 Dysarthria and anarthria: Secondary | ICD-10-CM | POA: Diagnosis not present

## 2019-08-29 DIAGNOSIS — R41841 Cognitive communication deficit: Secondary | ICD-10-CM | POA: Diagnosis not present

## 2019-08-29 NOTE — Therapy (Signed)
[facility] [address] [place], [name], [address] Phone: [phone]   Fax:  [phone]  Speech Language Pathology Treatment  Patient Details  Name: Ronald Noble MRN: [id number] Date of Birth: 03-12-1946 Referring Provider (SLP): Star Age, MD   Encounter Date: 08/29/2019  End of Session - 08/29/19 1718    Visit Number  11    Number of Visits  17    Date for SLP Re-Evaluation  09/25/19    SLP Start Time  1533    SLP Stop Time   1615    SLP Time Calculation (min)  42 min    Activity Tolerance  Patient tolerated treatment well       Past Medical History:  Diagnosis Date  . BPH (benign prostatic hyperplasia)   . Parkinson disease ([place])   . Skin cancer of face    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  . Sleep apnea    mild  . Spider bite    brown recluse/ left ankle/08/13  . Urethral stricture     Past Surgical History:  Procedure Laterality Date  . BURR HOLE W/ STEREOTACTIC INSERTION OF DBS LEADS / INTRAOP MICROELECTRODE RECORDING  04/2014  . COLONOSCOPY    . CYSTOSCOPY WITH URETHRAL DILATATION N/A 06/15/2019   Procedure: CYSTOSCOPY WITH URETHRAL DILATATION;  Surgeon: Franchot Gallo, MD;  Location: [facility];  Service: Urology;  Laterality: N/A;  30 MINS  . KNEE SURGERY     exploratory/left  . SKIN CANCER EXCISION  01/2014   face  . TRANSURETHRAL RESECTION OF PROSTATE     2008    There were no vitals filed for this visit.  Subjective Assessment - 08/29/19 1534    Subjective  "I was an hour early and then I was almost late."    Currently in Pain?  No/denies            ADULT SLP TREATMENT - 08/29/19 1535      General Information   Behavior/Cognition  Alert;Cooperative;Pleasant mood      Treatment Provided   Treatment provided  Cognitive-Linquistic      Pain Assessment   Pain Assessment  No/denies pain      Cognitive-Linquistic Treatment   Treatment focused on  Dysarthria    Skilled Treatment  Patient was rushed getting to ST and made the observation that he was carrying this over to his speech as well. After focus on AB, pt was still not able to maintain slow rate in conversational tasks, so worked with structured sentences and gradually increased cognitive load (occasional min cues for full breath, loudness). Progressed to 10 min conversation averaging WNL intensity and >95% intelligibility. Moved outdoors for conversation and pt maintained rate/intensity/intelligibility over 15 minute period.      Assessment / Recommendations / Plan   Plan  Continue with current plan of care      Progression Toward Goals   Progression toward goals  Progressing toward goals       SLP Education - 08/29/19 1717    Education Details  If rushing, stop and focus on just abdominal breathing, to start with    Person(s) Educated  Patient    Methods  Explanation    Comprehension  Verbalized understanding       SLP Short Term Goals - 08/29/19 1559      SLP SHORT TERM GOAL #1   Title  Pt will average low-mid 90sdB with loud /a/ for 3 sessions  with rare min A    Time  1    Period  Weeks    Status  Partially Met      SLP SHORT TERM GOAL #2   Title  Pt will average low-mid 70sdB on structured speech tasks with rare min A for three sessions    Baseline  08/08/19 08/17/19    Time  1    Period  Weeks    Status  Partially Met      SLP SHORT TERM GOAL #3   Title  Pt will maintain average low 70s dB over 8 minute simple conversation with rare min A, for 2 sessions    Baseline  08/17/19    Time  1    Period  Weeks    Status  Partially Met      SLP SHORT TERM GOAL #4   Title  pt will maintain speech intelligibility at 95%+ using intelligibility compensations(due to rushed speech) for 8 minutes simple conversation in three sesssions    Time  1    Period  Weeks    Status  Not Met       SLP Long Term Goals - 08/29/19 1559      SLP LONG TERM  GOAL #1   Title  Pt will maintain low 70sdB over 10 minute mod complex conversation with rare min A in three therapy sessions    Baseline  08/24/19    Time  1    Period  Weeks   or 17 sessions   Status  On-going      SLP LONG TERM GOAL #2   Title  Pt will achieve 95%+ speech intelligibility outside of tx room (due to rushing speech) over 12 minutes mod complex conversation with modified independence (external cues) in three sessions    Baseline  08/29/19    Time  3    Period  Weeks    Status  On-going       Plan - 08/29/19 1718    Clinical Impression Statement  Loudness largely WNL today; required cues initially for abdominal breathing vs rushed speech.. With focus on breath support and pacing, patient able to carryover to conversation outside [place] room for 15 minutes today. Anticipate d/c in next session. I recommend skilled ST to maximize intelligibility for conversations at home and work.    Speech Therapy Frequency  2x / week    Duration  --   8 weeks or 17 total sessions   Treatment/Interventions  Compensatory techniques;Functional tasks;Cueing hierarchy;Language facilitation;Cognitive reorganization;SLP instruction and feedback;Patient/family education;Internal/external aids    Potential to Achieve Goals  Fair    Potential Considerations  Cooperation/participation level       Patient will benefit from skilled therapeutic intervention in order to improve the following deficits and impairments:   Dysarthria and anarthria    Problem List Patient Active Problem List   Diagnosis Date Noted  . Bilateral olecranon bursitis 07/05/2019  . Other bursitis of knee, left knee 12/06/2018  . Pain in right leg 12/06/2018  . Parkinson disease [facility])    Deneise Lever, [place], CCC-SLP Speech-Language Pathologist  Aliene Altes 08/29/2019, 5:19 PM  [facility] [address] [place], [name], [address] Phone: [phone]   Fax:   [phone]   Name: Christphor Groft MRN: [id number] Date of Birth: [birth year]

## 2019-08-31 ENCOUNTER — Ambulatory Visit: Payer: Medicare Other

## 2019-08-31 ENCOUNTER — Other Ambulatory Visit: Payer: Self-pay

## 2019-08-31 DIAGNOSIS — R41841 Cognitive communication deficit: Secondary | ICD-10-CM

## 2019-08-31 DIAGNOSIS — R471 Dysarthria and anarthria: Secondary | ICD-10-CM | POA: Diagnosis not present

## 2019-08-31 NOTE — Therapy (Signed)
[facility] [address] [place], [name], [address] Phone: [phone]   Fax:  [phone]  Speech Language Pathology Treatment/Discharge summary  Patient Details  Name: Ronald Noble MRN: [id number] Date of Birth: Dec 22, 1945 Referring Provider (SLP): Star Age, MD   Encounter Date: 08/31/2019  End of Session - 08/31/19 1801    Visit Number  12    Number of Visits  17    Date for SLP Re-Evaluation  09/25/19    SLP Start Time  12    SLP Stop Time   1400    SLP Time Calculation (min)  41 min    Activity Tolerance  Patient tolerated treatment well       Past Medical History:  Diagnosis Date  . BPH (benign prostatic hyperplasia)   . Parkinson disease ([place])   . Skin cancer of face    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  . Sleep apnea    mild  . Spider bite    brown recluse/ left ankle/08/13  . Urethral stricture     Past Surgical History:  Procedure Laterality Date  . BURR HOLE W/ STEREOTACTIC INSERTION OF DBS LEADS / INTRAOP MICROELECTRODE RECORDING  04/2014  . COLONOSCOPY    . CYSTOSCOPY WITH URETHRAL DILATATION N/A 06/15/2019   Procedure: CYSTOSCOPY WITH URETHRAL DILATATION;  Surgeon: Franchot Gallo, MD;  Location: [facility];  Service: Urology;  Laterality: N/A;  30 MINS  . KNEE SURGERY     exploratory/left  . SKIN CANCER EXCISION  01/2014   face  . TRANSURETHRAL RESECTION OF PROSTATE     2008    There were no vitals filed for this visit.         ADULT SLP TREATMENT - 08/31/19 1729      General Information   Behavior/Cognition  Alert;Cooperative;Pleasant mood      Treatment Provided   Treatment provided  Cognitive-Linquistic      Cognitive-Linquistic Treatment   Treatment focused on  Dysarthria    Skilled Treatment  Pt due for next dose of PD meds in 40 minutes however pt stated "I should be good - I'm in the middle of my dose now." Previous  dose was reported by pt as 11AM. Pt produced loud /a/ with low 90s dB and cues for holding /a/ longer instead of stopping after 6 seconds due to "I want to make sure I feel a good breath." SLP told pt that the focus of this task was to promote muscle memory for abdominal support for WNL volume speech and not necessarily for working on breath management, although pt hsould acheive abdominal breathing when performing loud /a/.  When SLP asked pt to perform his everyday sentences pt reminded SLP that his sentences changed every day - and told pt two sentences regarding waking up in the morning and providing coffee for his wife, then pt continued and began talking about pt's and wife's differing morning routines until SLP stopped pt and brought him back to task. Approximately [phone] pt began to develop incr'd amplitude of dyskenesia in hands, trunk, and arms without awareness until SLP addressed the topic. "I hadn't noticed it," pt stated. In structured sentences pt req'd mod cues occasionly to usually as session progrssed, for slower rate. SLP progressed to short semi-structured task of pt-pertinent sequences (cutting down a tree, building a fire, etc) - pt req'd usual mod-max A for reduced rate. Pt's volume at this time was widely variable, from mid  60s to mid 70s dB and with largely a fast rate. SLP suspects pt's difficulties today were largely explained by pt at the end of his PD dose cycle. Reiterated to pt to practice loud /a/ each day BID and pt agreed to return in 6 months for PD screen.       Assessment / Recommendations / Plan   Plan  Discharge SLP treatment due to (comment)   pt pleased with his progress     Progression Toward Goals   Progression toward goals  --   see goal summary - d/c day      SLP Education - 08/31/19 1800    Education Details  hold out loud /a/, rationale for loud /a/, need to do loud /a/ BID    Person(s) Educated  Patient    Methods  Explanation;Demonstration     Comprehension  Verbalized understanding;Returned demonstration;Verbal cues required       SLP Short Term Goals - 08/29/19 1559      SLP SHORT TERM GOAL #1   Title  Pt will average low-mid 90sdB with loud /a/ for 3 sessions with rare min A    Time  1    Period  Weeks    Status  Partially Met      SLP SHORT TERM GOAL #2   Title  Pt will average low-mid 70sdB on structured speech tasks with rare min A for three sessions    Baseline  08/08/19 08/17/19    Time  1    Period  Weeks    Status  Partially Met      SLP SHORT TERM GOAL #3   Title  Pt will maintain average low 70s dB over 8 minute simple conversation with rare min A, for 2 sessions    Baseline  08/17/19    Time  1    Period  Weeks    Status  Partially Met      SLP SHORT TERM GOAL #4   Title  pt will maintain speech intelligibility at 95%+ using intelligibility compensations(due to rushed speech) for 8 minutes simple conversation in three sesssions    Time  1    Period  Weeks    Status  Not Met       SLP Long Term Goals - 08/31/19 1803      SLP LONG TERM GOAL #1   Title  Pt will maintain low 70sdB over 10 minute mod complex conversation with rare min A in three therapy sessions    Baseline  08/24/19    Period  --   or 17 sessions   Status  Partially Met      SLP LONG TERM GOAL #2   Title  Pt will achieve 95%+ speech intelligibility outside of tx room (due to rushing speech) over 12 minutes mod complex conversation with modified independence (external cues) in three sessions    Baseline  08/29/19    Status  Partially Met       Plan - 08/31/19 1802    Clinical Impression Statement  Pt with difficulty in session today with loudness and with rushes of speech, moreso as session progressed likely due to end of PD med dose (next dose reportedly needed adminstered at end of session). Pt agreed with d/c today.    Treatment/Interventions  Compensatory techniques;Functional tasks;Cueing hierarchy;Language facilitation;Cognitive  reorganization;SLP instruction and feedback;Patient/family education;Internal/external aids    Potential to Achieve Goals  Fair    Potential Considerations  Cooperation/participation level  Patient will benefit from skilled therapeutic intervention in order to improve the following deficits and impairments:   Dysarthria and anarthria  Cognitive communication deficit   SPEECH THERAPY DISCHARGE SUMMARY  Visits from Start of Care: 12  Current functional level related to goals / functional outcomes: See pt's goals above. Pt partially met all LTGs, and mostly paritally met all STGs. Pt cont to practice as he wishes, not always what is recommended by SLP.    Remaining deficits: Mild-mod dysarthria and cog-comm deficits, depending on on-off times and fatigue.   Education / Equipment: Loud /a/ procedure and rationale, need to do loud /a/ BID  Plan: Patient agrees to discharge.  Patient goals were partially met. Patient is being discharged due to being pleased with the current functional level.  ?????       Problem List Patient Active Problem List   Diagnosis Date Noted  . Bilateral olecranon bursitis 07/05/2019  . Other bursitis of knee, left knee 12/06/2018  . Pain in right leg 12/06/2018  . Parkinson disease ([place])     [place] ,[place], [place]  08/31/2019, 6:04 PM  [facility] [address] [place], [name], [address] Phone: [phone]   Fax:  [phone]   Name: Ridgely Anastacio MRN: [id number] Date of Birth: June 24, 1945

## 2019-09-20 DIAGNOSIS — G20 Parkinson's disease: Secondary | ICD-10-CM | POA: Diagnosis not present

## 2019-09-20 DIAGNOSIS — G245 Blepharospasm: Secondary | ICD-10-CM | POA: Diagnosis not present

## 2019-09-20 DIAGNOSIS — Z9689 Presence of other specified functional implants: Secondary | ICD-10-CM | POA: Diagnosis not present

## 2019-10-23 DIAGNOSIS — S91201A Unspecified open wound of right great toe with damage to nail, initial encounter: Secondary | ICD-10-CM | POA: Diagnosis not present

## 2019-10-23 DIAGNOSIS — L089 Local infection of the skin and subcutaneous tissue, unspecified: Secondary | ICD-10-CM | POA: Diagnosis not present

## 2019-10-23 DIAGNOSIS — S91202A Unspecified open wound of left great toe with damage to nail, initial encounter: Secondary | ICD-10-CM | POA: Diagnosis not present

## 2019-10-24 ENCOUNTER — Other Ambulatory Visit: Payer: Self-pay

## 2019-10-24 ENCOUNTER — Encounter: Payer: Self-pay | Admitting: Orthopedic Surgery

## 2019-10-24 ENCOUNTER — Ambulatory Visit (INDEPENDENT_AMBULATORY_CARE_PROVIDER_SITE_OTHER): Payer: Medicare Other | Admitting: Physician Assistant

## 2019-10-24 ENCOUNTER — Ambulatory Visit (INDEPENDENT_AMBULATORY_CARE_PROVIDER_SITE_OTHER): Payer: Medicare Other

## 2019-10-24 ENCOUNTER — Ambulatory Visit: Payer: Self-pay

## 2019-10-24 VITALS — Ht 71.0 in | Wt 155.0 lb

## 2019-10-24 DIAGNOSIS — M79671 Pain in right foot: Secondary | ICD-10-CM

## 2019-10-24 DIAGNOSIS — M79672 Pain in left foot: Secondary | ICD-10-CM

## 2019-10-24 NOTE — Progress Notes (Signed)
Office Visit Note   Patient: Ronald Noble           Date of Birth: [birth year]           MRN: [id number] Visit Date: 10/24/2019              Requested by: [name], MD [address] [place],  [place] [address] PCP: [name], MD  Chief Complaint  Patient presents with  . Left Foot - Pain, New Patient (Initial Visit)  . Right Foot - Pain      HPI: This is a pleasant 74 year old gentleman who is referred today for evaluation of bilateral great toenails.  Patient states that his right great toenail has longstanding history of toe fungus.  The nail has gotten darker and black.  Also has begun to get loose.  On his left foot he over the weekend jammed his toes up against the end of the bed and fell down on his knees.  He has abrasions and bleeding of the great toe and second toe.  It is not particularly painful to him  Assessment & Plan: Visit Diagnoses:  1. Pain in both feet     Plan: He will cleanse daily with mild antibiotic soap and water.  I have encouraged him to wear socks over his feet especially on the left while it is recovering.  He will follow-up in 3 weeks  Follow-Up Instructions: No follow-ups on file.   Ortho Exam  Patient is alert, oriented, no adenopathy, well-dressed, normal affect, normal respiratory effort. Right foot: Onychomycotic great toenail.  It is trimmed back to the base surface.  There is no evidence of an infective process no cellulitis good capillary refill on his great toe.  Left great toe he has abrasions on the great toe and second toe.  There is an onychomycotic great toenail.  Nontender to palpation.  Imaging: XR Foot 2 Views Left  Result Date: 10/24/2019 2 views of the left foot were reviewed today no acute osseous injuries no sign of infective process well-maintained alignment  XR Foot 2 Views Right  Result Date: 10/24/2019 His foot were reviewed today there is no acute osseous injuries no sign of any abscess no soft tissue  swelling  No images are attached to the encounter.  Labs: Lab Results  Component Value Date   REPTSTATUS 02/08/2014 FINAL 02/07/2014   CULT NO GROWTH Performed at [facility]  02/07/2014     Lab Results  Component Value Date   ALBUMIN 4.4 11/14/2011    No results found for: MG No results found for: VD25OH  No results found for: PREALBUMIN CBC EXTENDED Latest Ref Rng & Units 06/15/2019 10/29/2014 02/07/2014  WBC 4.0 - 10.5 K/uL - 5.4 -  RBC 4.22 - 5.81 MIL/uL - 5.09 -  HGB 13.0 - 17.0 g/dL 16.7 15.9 16.0  HCT 39 - 52 % 49.0 45.6 47.0  PLT 150 - 400 K/uL - 137(L) -     Body mass index is 21.62 kg/m.  Orders:  Orders Placed This Encounter  Procedures  . XR Foot 2 Views Left  . XR Foot 2 Views Right   No orders of the defined types were placed in this encounter.    Procedures: No procedures performed  Clinical Data: No additional findings.  ROS:  All other systems negative, except as noted in the HPI. Review of Systems  Objective: Vital Signs: Ht 5\' 11"  (1.803 m)   Wt 155 lb (70.3 kg)  BMI 21.62 kg/m   Specialty Comments:  No specialty comments available.  PMFS History: Patient Active Problem List   Diagnosis Date Noted  . Bilateral olecranon bursitis 07/05/2019  . Other bursitis of knee, left knee 12/06/2018  . Pain in right leg 12/06/2018  . Parkinson disease [facility])    Past Medical History:  Diagnosis Date  . BPH (benign prostatic hyperplasia)   . Parkinson disease ([place])   . Skin cancer of face    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  . Sleep apnea    mild  . Spider bite    brown recluse/ left ankle/08/13  . Urethral stricture     Family History  Problem Relation Age of Onset  . COPD Mother   . Cancer Father        Bladder  . Colon cancer Neg Hx   . Esophageal cancer Neg Hx   . Stomach cancer Neg Hx     Past Surgical History:  Procedure Laterality Date  . BURR HOLE W/ STEREOTACTIC INSERTION OF DBS LEADS /  INTRAOP MICROELECTRODE RECORDING  04/2014  . COLONOSCOPY    . CYSTOSCOPY WITH URETHRAL DILATATION N/A 06/15/2019   Procedure: CYSTOSCOPY WITH URETHRAL DILATATION;  Surgeon: Franchot Gallo, MD;  Location[facility];  Service: Urology;  Laterality: N/A;  30 MINS  . KNEE SURGERY     exploratory/left  . SKIN CANCER EXCISION  01/2014   face  . TRANSURETHRAL RESECTION OF PROSTATE     2008   Social History   Occupational History  . Occupation: LAWYER    Employer: [employer]  Tobacco Use  . Smoking status: Never Smoker  . Smokeless tobacco: Never Used  Vaping Use  . Vaping Use: Never used  Substance and Sexual Activity  . Alcohol use: Yes    Alcohol/week: 5.0 standard drinks    Types: 3 Glasses of wine, 2 Cans of beer per week    Comment: occasional  . Drug use: No  . Sexual activity: Not on file

## 2019-10-31 ENCOUNTER — Ambulatory Visit: Payer: Medicare Other | Admitting: Neurology

## 2019-10-31 ENCOUNTER — Telehealth: Payer: Self-pay | Admitting: Neurology

## 2019-10-31 ENCOUNTER — Encounter: Payer: Self-pay | Admitting: Neurology

## 2019-10-31 NOTE — Telephone Encounter (Signed)
I called pt. No answer, left a message asking pt to call me back.  Also advised I had sent a my chart message.

## 2019-10-31 NOTE — Telephone Encounter (Signed)
Pt is asking for a call to discuss great concerns about his Paranoia and hallucinations he is having

## 2019-10-31 NOTE — Telephone Encounter (Signed)
My chart message sent in regards to this message.

## 2019-11-02 ENCOUNTER — Ambulatory Visit: Payer: Medicare Other | Admitting: Neurology

## 2019-11-06 ENCOUNTER — Encounter: Payer: Self-pay | Admitting: Neurology

## 2019-11-14 ENCOUNTER — Ambulatory Visit: Payer: Medicare Other | Admitting: Orthopedic Surgery

## 2019-11-14 ENCOUNTER — Ambulatory Visit (INDEPENDENT_AMBULATORY_CARE_PROVIDER_SITE_OTHER): Payer: Medicare Other | Admitting: Physician Assistant

## 2019-11-14 ENCOUNTER — Encounter: Payer: Self-pay | Admitting: Orthopedic Surgery

## 2019-11-14 VITALS — Ht 71.0 in | Wt 155.0 lb

## 2019-11-14 DIAGNOSIS — B351 Tinea unguium: Secondary | ICD-10-CM

## 2019-11-14 NOTE — Progress Notes (Signed)
Office Visit Note   Patient: Ronald Noble           Date of Birth: 29-Jun-1945           MRN: [id number] Visit Date: 11/14/2019              Requested by: [name], MD [address] [place],  [place] [address] PCP: [name], MD  Chief Complaint  Patient presents with  . Right Foot - Follow-up  . Left Foot - Follow-up      HPI: Patient is here to follow-up on his bilateral feet.  He has a history of onychomycotic nails.  On his left great toenail he has a history of what he calls toe fungus he does state that this morning that the toenail came off.  On his right foot he had some concerns about his lesser toes because he had jammed his nails.  He says these are also much better  Assessment & Plan: Visit Diagnoses: No diagnosis found.  Plan: Patient can apply a little antibiotic to the pressure area for about a week and keep it covered.  Follow-up as needed.  Follow-Up Instructions: No follow-ups on file.   Ortho Exam  Patient is alert, oriented, no adenopathy, well-dressed, normal affect, normal respiratory effort. Onychomycotic toenails.  On the left side the nail has completely come off.  There is healthy vascular tissue no drainage no surrounding cellulitis.  On the right great toenail he has onychomycosis that is degenerated great toenail there is no surrounding cellulitis no drainage no cellulitis no evidence of infection  Imaging: No results found. No images are attached to the encounter.  Labs: Lab Results  Component Value Date   REPTSTATUS 02/08/2014 FINAL 02/07/2014   CULT NO GROWTH Performed at [facility]  02/07/2014     Lab Results  Component Value Date   ALBUMIN 4.4 11/14/2011    No results found for: MG No results found for: VD25OH  No results found for: PREALBUMIN CBC EXTENDED Latest Ref Rng & Units 06/15/2019 10/29/2014 02/07/2014  WBC 4.0 - 10.5 K/uL - 5.4 -  RBC 4.22 - 5.81 MIL/uL - 5.09 -  HGB 13.0 - 17.0 g/dL 16.7  15.9 16.0  HCT 39 - 52 % 49.0 45.6 47.0  PLT 150 - 400 K/uL - 137(L) -     Body mass index is 21.62 kg/m.  Orders:  No orders of the defined types were placed in this encounter.  No orders of the defined types were placed in this encounter.    Procedures: No procedures performed  Clinical Data: No additional findings.  ROS:  All other systems negative, except as noted in the HPI. Review of Systems  Objective: Vital Signs: Ht 5\' 11"  (1.803 m)   Wt 155 lb (70.3 kg)   BMI 21.62 kg/m   Specialty Comments:  No specialty comments available.  PMFS History: Patient Active Problem List   Diagnosis Date Noted  . Bilateral olecranon bursitis 07/05/2019  . Other bursitis of knee, left knee 12/06/2018  . Pain in right leg 12/06/2018  . Parkinson disease [facility])    Past Medical History:  Diagnosis Date  . BPH (benign prostatic hyperplasia)   . Parkinson disease ([place])   . Skin cancer of face    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  . Sleep apnea    mild  . Spider bite    brown recluse/ left ankle/08/13  . Urethral stricture  Family History  Problem Relation Age of Onset  . COPD Mother   . Cancer Father        Bladder  . Colon cancer Neg Hx   . Esophageal cancer Neg Hx   . Stomach cancer Neg Hx     Past Surgical History:  Procedure Laterality Date  . BURR HOLE W/ STEREOTACTIC INSERTION OF DBS LEADS / INTRAOP MICROELECTRODE RECORDING  04/2014  . COLONOSCOPY    . CYSTOSCOPY WITH URETHRAL DILATATION N/A 06/15/2019   Procedure: CYSTOSCOPY WITH URETHRAL DILATATION;  Surgeon: Franchot Gallo, MD;  Location: [facility];  Service: Urology;  Laterality: N/A;  30 MINS  . KNEE SURGERY     exploratory/left  . SKIN CANCER EXCISION  01/2014   face  . TRANSURETHRAL RESECTION OF PROSTATE     2008   Social History   Occupational History  . Occupation: LAWYER    Employer: [employer]  Tobacco Use  . Smoking status: Never Smoker   . Smokeless tobacco: Never Used  Vaping Use  . Vaping Use: Never used  Substance and Sexual Activity  . Alcohol use: Yes    Alcohol/week: 5.0 standard drinks    Types: 3 Glasses of wine, 2 Cans of beer per week    Comment: occasional  . Drug use: No  . Sexual activity: Not on file

## 2019-11-22 ENCOUNTER — Ambulatory Visit: Payer: Medicare Other | Admitting: Neurology

## 2019-11-23 ENCOUNTER — Other Ambulatory Visit: Payer: Self-pay | Admitting: Neurology

## 2019-11-23 ENCOUNTER — Encounter: Payer: Self-pay | Admitting: Neurology

## 2019-11-23 ENCOUNTER — Ambulatory Visit (INDEPENDENT_AMBULATORY_CARE_PROVIDER_SITE_OTHER): Payer: Medicare Other | Admitting: Neurology

## 2019-11-23 VITALS — BP 112/68 | HR 72 | Ht 71.0 in | Wt 159.3 lb

## 2019-11-23 DIAGNOSIS — G4752 REM sleep behavior disorder: Secondary | ICD-10-CM | POA: Diagnosis not present

## 2019-11-23 DIAGNOSIS — G479 Sleep disorder, unspecified: Secondary | ICD-10-CM | POA: Diagnosis not present

## 2019-11-23 DIAGNOSIS — Z9689 Presence of other specified functional implants: Secondary | ICD-10-CM | POA: Diagnosis not present

## 2019-11-23 DIAGNOSIS — G20 Parkinson's disease: Secondary | ICD-10-CM

## 2019-11-23 MED ORDER — CLONAZEPAM 0.25 MG PO TBDP: 0.2500 mg | ORAL_TABLET | Freq: Every day | ORAL | 5 refills | Status: DC

## 2019-11-23 NOTE — Patient Instructions (Signed)
I am glad to hear that you are doing fairly well and feels stable.  Let us continue with your medications.  To help you sleep a little better and tone down treatment excellent behavior, you can try taking clonazepam 0.25 mg strength 2 pills at bedtime for the next few days.  If you find this more effective, I will have to change your prescription to allow for 2 pills at night as your current prescription is only for 1 pill at bedtime.  I do appreciate and understand your concern for your sister.  Let me assure you that I am doing the best I can to take care of her.  You are on the DPR for her.

## 2019-11-23 NOTE — Progress Notes (Signed)
Subjective:    Patient ID: Ronald Noble is a 74 y.o. male.  HPI     Interim history:   Ronald Noble is a 74 year old right-handed gentleman with an underlying medical history of skin cancer including melanoma, enlarged prostate (s/p TURP), who presents for followup consultation of his R sided predominant Parkinson's disease, complicated by RBD and sleep disturbance as well as mild dyskinesias, with s/p bilateral DBS placements at [facility], s/p L IPG replacement on the L in 09/2018. He is accompanied by his wife again today. I last saw him on 05/03/2019, at which time he reported a problem with his lower back and right knee.  He had to use a cane for a while after a fall in the summer.  He had physical therapy and also a steroid injection in November 2020.  He was overall stable.  He was advised to follow-up routinely in 6 months and continue with his medications.  He emailed in the interim with concerns regarding his sisters Parkinson's disease and associated delusions and hallucinations.  Today, 11/23/2019: He reports feeling stable, Botox injection for his blepharospasm has not been effective so he has stopped getting those.  He slipped and fell on wet floor recently as he was carrying groceries but did not hurt himself thankfully.  He is quite concerned about his sister, he had emailed me about her as well and I reassured him that we had a phone visit yesterday and that I was in the process of ordering more testing and that I would do the best I can to take care of her.  They do report that they have discussed with his sister about seeing a geriatric specialist at [facility].  I ensured him that I would offer this to her.   He had an interim appointment with Dr. Deboraha Sprang at [facility] on 09/20/19, next appointment in September and an appointment with Elbert Ewings in October.    The patient's allergies, current medications, family history, past medical history, past social history, past surgical  history and problem list were reviewed and updated as appropriate.    Previously (copied from previous notes for reference):   I saw him on 11/01/2018, at which time he reported more problems with his balance and difficulty with freezing and also difficulty stopping when needed.  He had interim left IPG replacement on 09/23/2018.  He has been receiving Botox injections for his blepharospasm.  He did report a fall and bumped his right hip.  He had an x-ray which was negative for any bony injuries.  He was advised to monitor his hip pain.  He was advised to continue with his Parkinson's medications.    I saw him on 04/28/2018, at which time he reported doing well, had no falls, weight has been stable.  I suggested he keep his medications the same.       I saw him on 10/26/2017, at which time he reported doing okay, tried Gocovri for one month, but it was cost prohibitive. He was back on amantadine 100 mg bid. He tried the reduced dose of amantadine but did not do well on the lower dose. He reported no recent falls. Was getting Botox injections for blepharospasm through [name]. He was on Sinemet 1-1/2 pills 5 times a day, long-acting Requip once daily at 5 PM. I asked him to continue with his medication regimen.   I saw him on 04/28/2017, at which time he reported some daytime somnolence. He was supposed to get cataract  surgeries and colonoscopy done. He had done well after Botox injections at [facility]. He was on amantadine twice daily and I suggested we reduce it to once daily and consider taking him off altogether. I suggested he take his long-acting Requip 6 mg once daily in the evening, instead of during the day. We kept his Sinemet at 1-1/2 pills 5 times a day.     I saw him on 10/29/2016, at which time he reported doing okay, he had no recent falls, his left eye ptosis improved after adjustment of his DBS. He did not end up having any Botox injections to his eyelid. He was on Sinemet 1-1/2  pills 5 times a day, 3 hourly intervals, starting approximately at 8 AM. He was on amantadine twice daily and Requip XL around 1 PM.    I saw him on 04/09/2016, at which time he was doing okay, he had improvement in his blood first specimen on the left in facial dyskinesias. He had been followed at [facility]. He was started on amantadine and was taking it twice daily. He was off of Stalevo completely. He felt that his abdominal cramping and facial dyskinesias improved after he came off of Stalevo altogether. He was in the process of being scheduled for Botox injections for his blepharospasm at [facility]. He was still on once daily long-acting Requip, when he tried to stop it completely he noticed a flareup of his RLS. He denied any recent falls. He was exercising regularly. He did have some constipation but generally under control with some regularity in his bowel movements reported.    I saw him on 07/09/2015, at which time he reported no recent syncope. He had a tilt table test in January 2017 which showed abnormal findings but minimal symptoms reported. He saw Dr. Hartford Poli with vascular surgery for consultation on 05/28/2015. He had a 24-hour blood pressure monitor. He had a follow-up appointment pending with his vascular specialist. He also had an appointment pending with [name] in October 2017. He felt he pulled a muscle in his upper back on the right. He was on Stalevo about 4 times a day, sometimes 5 times a day depending on his day to day activities. He was on long-acting Requip 6 mg once daily. He was sleeping a little better. He had been advised to start using compression stockings but had not started yet. He had noticed worsening of droopy eyelid. He mailed in December 2017 Re: Blepharospasm and treatment for this. He had been seeing an ophthalmologist.   I saw him on 03/11/2015, at which time he reported a recent fall in September 2016. He had just come back from a road trip and after being  in the car for about 2-1/2 hours he got out of the car and fell to the ground for his wife even realize what happened. He fell on pavement and chipped his tooth. They were with some friends who helped. He had to have a crown placed over his chipped tooth. His DBS settings were adjusted when he had his follow-up at [facility]. His orthostatic blood pressure values were stable. Since his settings were changed he had no other syncopal spell. He did have some medication changes as well. He was no longer on clonazepam. Stalevo was 100 mg strength 4-5 times a day. Requip long-acting was 6 mg once daily. She was on long-acting melatonin 6 mg each night. He was advised to drink 4 bottles of water. He was scheduled for  tilt table test as well. His wife was concerned about him climbing on ladders. We talked about gait safety and syncope risk. He was advised not to climb on any ladders or work at heights.    I saw him on 11/07/2014, at which time he reported doing reasonably well. He had nasal congestion after his fall and injury. Of note, unfortunately, he had a syncopal spell on 10/29/2014 and hit his nose as he fell and sustained a nasal fracture. I reviewed the emergency room records. He was referred to ENT. His laceration was repaired with Dermabond. He reported that he stood up and felt lightheaded and then passed out. He fell and hit his face. He had a CT head without contrast as well as maxillofacial CT without contrast on 10/29/2014 which showed: RIGHT and LEFT nasal bone fractures. Small displaced fracture fragment on the LEFT. No skull fracture or intracranial hemorrhage. Unremarkable appearing deep brain stimulator leads. He fell at work. He had taken Levitra at night and 2 days prior he was working hard in the yard. He is notoriously not drinking enough water he admits. He had taken his Stalevo in the morning and was due for his next dose and became lightheaded when he got up quickly from his desk.  He had been sitting for about 45 minutes consistently at his desk. He did have a warning sign and kept walking instead of sitting down again. He had a follow-up appointment at [facility] on 11/02/2014 and his Requip was increased slightly again. He had re-programming of his DBS as well. He had reduced the Requip XL to 3 mg twice daily but this was increased to 4 mg twice daily. His Stalevo was at 4-6 pills daily depending on his work day. He does get sleepy at night around 9:30 and often falls asleep while watching TV. He tried Melatonin long-acting but in combination with clonazepam at night it did not work well and in fact he had insomnia. He tried to reduce his clonazepam to 0.25 mg each night but increased it back to 0.5 mg each night. For his nose fracture he saw Dr. Redmond Baseman and was scheduled for surgery. I did not change his medications. I asked him to drink more water and reminded him to change positions slowly.   I saw him on 08/07/2014, at which time he reported doing quite well after his DBS placement. His wife felt that he was doing great. He was eating well. He was not drinking enough water. He had trace edema in his distal lower extremities. He had 2 appointments for DBS programming. His Stalevo was reduced from 6 pills a day to 4 pills a day but then he increased it back to 5 pills a day. He was off generic Sinemet. He was on Azilect once daily and Requip long-acting 8 mg twice daily.    He's been back to work. He is currently on Azilect once daily, Requip XL 8 mg twice daily, Stalevo 100 mg strength one pill 5 times a day and he has stopped the carbidopa-levodopa. I reviewed his outside records from Dr. Salomon Fick at [facility] neurosurgery. Patient had bilateral STN DBS electrodes placed on 06/01/2014. He had pulse generators placed approximately 3 weeks after that, 06/13/14.   I saw him on 02/01/2014, at which time he reported difficulty staying asleep. He had not  consistently tried melatonin. He had problems with urinary urgency and was advised to try Kegel exercises by his urologist. His PCP  talked to an infectious diseases specialist and it was determined he did not actually have Lyme disease.    He did not show for an appointment on 01/30/2014 - he genuinely forgot!   I saw him on 12/19/2013, at which time he reported that his medication was not working as well. He reported more freezing episodes and more off time. Of note, he had been using a new protein milkshake that he was drinking first thing in the morning. He had lost some weight and in an effort to improve his weight loss he had started this new supplements. His primary care physician had referred him to a nutritionist. We also talked about DBS evaluation and he requested to see another specialist for DBS candidacy. I referred him to [name] at [facility] I also advised him to discontinue using the new protein supplement as I felt it may be interfering with the absorption of his Stalevo. I also suggested a trial of Parcopa half a pill 3 times a day at 11, 2 PM and 7 PM and we kept the Stalevo the same. He was advised to use MiraLAX as needed for constipation.   I saw him on 07/31/13, at which time [name], including swimming, yoga, and weights. He has been on Stalevo 100 mg every 2 1/2 hours starting at 7:30 AM for 6 doses. He stopped clonazepam, as he felt some daytime sluggishness. He has been taking C/L 1/2 tid in between. He had no cognitive issues, but did have some "downtime" midday. I did not change his medications. He had an appointment with [name] at [facility] in June, but there was a mix up with his appointment and he does not have an appointment until next year.   In the interim, he was diagnosed with Lyme d/s in July. He was tested and treated by his PCP has has been referred to an infectious d/s doctor. He brings in his latest lying  test results from 12/05/2013 which I reviewed: It looks like his IgM antibody status is negative but his IgG antibody status has not quite turn positive as far as I understand the report. That means he is probably in the subacute or early chronic phase if I interpret this correctly. He requested a sooner than scheduled appointment, due to feeling more freezing and more off time. This starts in the late morning hours but can also go into the afternoon after lunch. Cognitively and mood wise he feels stable. He does not sleep well at night. He has not consistently tried melatonin I recommended.   I saw him on 01/30/2013, at which time I felt he was stable. We talked about DBS surgery. I did not make any medication changes. He is aware of the increase risk of melanoma associated with DAs and his dermatologist is aware that he is taking Requip XL.   I first met him on 09/27/2012, which time I did not make any changes to his medication and did not order any new test as he was stable. He saw [name] at [facility] and discussed DBS in the past but was not deemed a cadidate for it at the time as he was doing well. He was told by [name] to stay on schedule with his medication. 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 Sleep has been an issue. He goes to sleep well, but wakes up after 45 minutes  to 1 hour. He is seeing a urologist. He exercises regularly.   He previously followed with [name] and was last seen by him on 05/25/2012, at which time [name] felt he was stable, but because of off time at 2 hours he increased his carbidopa-levodopa by adding half of a 10-100 mg strength 3 times a day to his regimen. He recommended decreasing his Stalevo to 125 mg from 100 mg 6 times a day. He has been on Stalevo 125 mg 6 times a day, at 7:30, 10, 12:30, 3 PM, 5:30 PM and 8 PM. He is on clonazepam 0.5 mg at night, Requip long-acting 6 mg twice daily, rasagiline 1 mg once daily, carbidopa-levodopa  10-100 mg strength half a tablet 3 times a day, 7:30 to 10, then midday and later in the evening.   He was followed by [name] since 2008 with a history of Parkinson's disease. He has a history of increased tone and slowness as well as tremor with R sided predominance. He was initially placed on Requip XL and then rasagiline was added in October 2008. In February 2010 Stalevo was added. He had low vitamin D levels in 2008. ANA, ESR, ceruloplasmin were normal except for slightly low ceruloplasmin of 16. Urine copper test was negative. Methylmalonic acid was normal. Urine for heavy metals is normal as well. MRI brain with and without contrast on 01/07/2007 was normal. He had a sleep study in October 2008 showing PLMs and evidence of RBD. Repeat sleep study in September 2011 showed supine obstructive sleep apnea and RBD. He had a CPAP titration study in late September 2011 which was not very successful. He has been on clonazepam at night. He has not noticed any compulsive behaviors, memory loss, depression, swelling, or orthostatic dizziness. He recently had a skin biopsy showing atypical cells, concerning for melanoma.   He works full-time as an Forensic psychologist, Barista. He exercises regularly and does yoga.     His Past Medical History Is Significant For: Past Medical History:  Diagnosis Date  . BPH (benign prostatic hyperplasia)   . Parkinson disease ([place])   . Skin cancer of face    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  . Sleep apnea    mild  . Spider bite    brown recluse/ left ankle/08/13  . Urethral stricture     His Past Surgical History Is Significant For: Past Surgical History:  Procedure Laterality Date  . BURR HOLE W/ STEREOTACTIC INSERTION OF DBS LEADS / INTRAOP MICROELECTRODE RECORDING  04/2014  . COLONOSCOPY    . CYSTOSCOPY WITH URETHRAL DILATATION N/A 06/15/2019   Procedure: CYSTOSCOPY WITH URETHRAL DILATATION;  Surgeon: Franchot Gallo, MD;  Location: [facility];  Service: Urology;  Laterality: N/A;  30 MINS  . KNEE SURGERY     exploratory/left  . SKIN CANCER EXCISION  01/2014   face  . TRANSURETHRAL RESECTION OF PROSTATE     2008    His Family History Is Significant For: Family History  Problem Relation Age of Onset  . COPD Mother   . Cancer Father        Bladder  . Colon cancer Neg Hx   . Esophageal cancer Neg Hx   . Stomach cancer Neg Hx     His Social History Is Significant For: Social History   Socioeconomic History  . Marital status: Married    Spouse name: Levada Dy  . Number of children: 4  . Years  of education: 5  . Highest education level: Not on file  Occupational History  . Occupation: LAWYER    Employer: [employer]  Tobacco Use  . Smoking status: Never Smoker  . Smokeless tobacco: Never Used  Vaping Use  . Vaping Use: Never used  Substance and Sexual Activity  . Alcohol use: Yes    Alcohol/week: 5.0 standard drinks    Types: 3 Glasses of wine, 2 Cans of beer per week    Comment: occasional  . Drug use: No  . Sexual activity: Not on file  Other Topics Concern  . Not on file  Social History Narrative   Patient is right handed and resides with wife   1-2 cups of coffee a day    Social Determinants of Health   Financial Resource Strain:   . Difficulty of Paying Living Expenses: Not on file  Food Insecurity:   . Worried About Programme researcher, broadcasting/film/video in the Last Year: Not on file  . Ran Out of Food in the Last Year: Not on file  Transportation Needs:   . Lack of Transportation (Medical): Not on file  . Lack of Transportation (Non-Medical): Not on file  Physical Activity:   . Days of Exercise per Week: Not on file  . Minutes of Exercise per Session: Not on file  Stress:   . Feeling of Stress : Not on file  Social Connections:   . Frequency of Communication with Friends and Family: Not on file  . Frequency of Social Gatherings with Friends and Family: Not on file  . Attends  Religious Services: Not on file  . Active Member of Clubs or Organizations: Not on file  . Attends Banker Meetings: Not on file  . Marital Status: Not on file    His Allergies Are:  No Known Allergies:   His Current Medications Are:  Outpatient Encounter Medications as of 11/23/2019  Medication Sig  . amantadine (SYMMETREL) 100 MG capsule 2 (two) times daily.   . carbidopa-levodopa (SINEMET IR) 25-100 MG tablet Take 1.5 tablets by mouth 5 (five) times daily.  . clonazePAM (KLONOPIN) 0.25 MG disintegrating tablet Take 1 tablet (0.25 mg total) by mouth at bedtime.  . Ropinirole HCl 6 MG TB24 daily. 1 tablet at 5 pm  . UNABLE TO FIND Med Name: vitamin d 2000 in units daily   No facility-administered encounter medications on file as of 11/23/2019.  :  Review of Systems:  Out of a complete 14 point review of systems, all are reviewed and negative with the exception of these symptoms as listed below:  Review of Systems  Neurological:       Here for 6 month f/u on PD.  Pt reports he has been stable over the last 6 months.  Pt is in need of refill for clonazepam.    Objective:  Neurological Exam  Physical Exam Physical Examination:   Vitals:   11/23/19 1255  BP: 112/68  Pulse: 72  SpO2: 96%    General Examination: The patient is a very pleasant 74 y.o. male in no acute distress. He appears well-developed and well-nourished and well groomed.   HEENT:Normocephalic, atraumatic, pupils are equal, round and reactive to light and accommodation. Extraocular tracking shows mild saccadic breakdown without nystagmus noted. There is mildlimitation toupgaze, and no significant blepharospasmof the left eye.Left eyelid lower than right, he does have intermittent facial involuntary grimacing. L eyelid spasm more today. Neck is moderately rigidwith neck tilt to the  R, oropharynx exam reveals mild mouth dryness. No significant airway crowding is noted. Mallampati is class II.  Tongue protrudes centrally and palate elevates symmetrically. There is no drooling. There are no dyskinesias. His stimulator wires are tunneled on the left side.   Chest:is clear to auscultation without wheezing, rhonchi or crackles noted.   Heart:sounds are regular and normal without murmurs, rubs or gallops noted.   Abdomen:is soft, non-tender and non-distended with normal bowel sounds appreciated on auscultation.  Extremities:There isno obvious edema in the distal lower extremities bilaterally.  Skin: is warm and dry with no trophic changes noted. Age-related changes are noted on the skin and scars from skin cancer removals.   Musculoskeletal: exam reveals no obvious joint deformities, tenderness, joint swelling or erythema, with the exception of low back pain reported and right shoulder pain.Upper body tilt to R.  Neurologically:  Mental status: The patient is awake and alert, paying good attention. He is able to completely provide the history. He is oriented to: person, place, time/date, situation, day of week, month of year and year. His memory, attention, language and knowledge areappropriate.There is no aphasia, agnosia, apraxia or anomia. There is amilddegree of bradyphrenia. Speech is mild to moderately hypophonic withminimal dysarthria noted. Mood is congruent and affect is normal.   Cranial nerves are as described above under HEENT exam. Motor exam: Normal bulk, and strength for age is noted. There are no dyskinesias noted today.No resting tremor. Tone ismild to moderatelyrigid with presence of cogwheeling in the right upper extremity. There is overall mild to moderate bradykinesia. There is no drift or rebound.   Fine motor skills exam: Fine motor skills are mild to moderately impaired, more pronounced on the R than L,butstable.  Cerebellar testing shows no dysmetria or intention tremor. There is no truncal or gait ataxia.  Sensory exam is intact to light  touch throughout.  Gait, station and balance: He stands up from the seated position with no significant difficulty and does not need to push up with His hands. He needs no assistance. No veering to one side is noted. He isnoted to lean to the rightmore andpostureismoderatelystooped. He walks withdecreased arm swing bilaterally. Mild dystonic posturing of the right arm. Balance is mildly impaired.   Assessment and Plan:   In summary, Ronald Noble is a very pleasant39 year old malewith an underlying medical history of skin cancer including melanoma, enlarged prostate (s/p TURP), who presents for followup consultation of his R sided predominant Parkinson's disease, complicated by RBD and sleep disturbance as well as mild dyskinesias. He iss/p bilateral DBS placements at [facility], s/p L IPG replacement on the L in 09/2018. He isOn Sinemet, Requip long-acting and amantadine. He has hadorthostatic hypotension, syncopal spells, fallswith injuries in the past, constipation and bladder hyperactivity,and nows/p b/l DBS placement at [facility] on 06/01/14 and generator placement on 06/13/2014.Left-sided battery replacement/generator replacement in June 2020.Overall, he has done well,has had some interim issues withlow back pain,andarthritis in his right shoulder, somesleep disturbance. And more recently, he had right knee pain. He did have 2 distinct syncopal spells that led to collapse, falls, and injuries.He sustained Fx of nose when he fell in his office, and then also fell in his driveway and chipped a tooth, needed a crown placed. He hadto have nose surgery. He saw a vascular specialist and had a tilt table test at [facility]. He had a24-hour blood pressure monitor test. Hewas started on amantadine for dyskinesias.He had a one month trial of Gocovri, but a Rx  was cost prohibitiveand hewentback to amantadine twice a day. He tried to reduce it to once daily but found it not as  effective.He wasgetting regularBotox injection for blepharospasmat [facility], but stopped recently.He used to be on Stalevo in the past but was able to stop this in or around early 2018. I suggested hecontinue with the current regimen. He also takes clonazepam namely 0.25 mg at night for his REM behavior disorder and nighttime sleep disruption.Hetriedmelatonin.He is advised to continue with Sinemet at the current dose, 1-1/2 pills 5 times a dayand Requip6 mg every evening. He had received a epidural steroid injection into the back and had physical therapy for the right knee, does not use a cane any longer. He does not have any recent issues with constipation thankfully.  He continues to take low-dose clonazepam, no longer on melatonin.  We discussed increasing the clonazepam.  Generally speaking, he would like to keep it the same, his wife is wondering if he could have benefit from an increased dose.  He is advised to try taking 2 pills for total dose of 0.5 mg each night for the next few nights and let us know.  If this is more effective for him we can always adjust that prescription.  I renewed the current prescription of 0.25 mg.  He is advised to follow-up in 6 months, sooner if needed.  I answered all the questions today to the best of my abilities.  He also voiced significant concern about his sister who has Parkinson's disease.  I assured him that I would order more testing and also a referral as they had discussed previously.  I would like to do everything I can to help his sister as well.  I tried to reassure them.  I do appreciate their genuine and sincere concern for his sister and would like to help her just as well.  I spent 30 minutes in total face-to-face time and in reviewing records during pre-charting, more than 50% of which was spent in counseling and coordination of care, reviewing test results, reviewing medications and treatment regimen and/or in discussing or reviewing the diagnosis of  PD, the prognosis and treatment options. Pertinent laboratory and imaging test results that were available during this visit with the patient were reviewed by me and considered in my medical decision making (see chart for details).

## 2019-12-07 DIAGNOSIS — G20 Parkinson's disease: Secondary | ICD-10-CM | POA: Diagnosis not present

## 2019-12-07 DIAGNOSIS — A6920 Lyme disease, unspecified: Secondary | ICD-10-CM | POA: Diagnosis not present

## 2019-12-07 DIAGNOSIS — M8580 Other specified disorders of bone density and structure, unspecified site: Secondary | ICD-10-CM | POA: Diagnosis not present

## 2019-12-07 DIAGNOSIS — N401 Enlarged prostate with lower urinary tract symptoms: Secondary | ICD-10-CM | POA: Diagnosis not present

## 2019-12-07 DIAGNOSIS — E785 Hyperlipidemia, unspecified: Secondary | ICD-10-CM | POA: Diagnosis not present

## 2019-12-07 DIAGNOSIS — M545 Low back pain: Secondary | ICD-10-CM | POA: Diagnosis not present

## 2019-12-12 ENCOUNTER — Other Ambulatory Visit: Payer: Self-pay | Admitting: Internal Medicine

## 2019-12-12 DIAGNOSIS — E785 Hyperlipidemia, unspecified: Secondary | ICD-10-CM

## 2019-12-14 ENCOUNTER — Telehealth: Payer: Self-pay | Admitting: Neurology

## 2019-12-14 DIAGNOSIS — G479 Sleep disorder, unspecified: Secondary | ICD-10-CM

## 2019-12-14 DIAGNOSIS — G4752 REM sleep behavior disorder: Secondary | ICD-10-CM

## 2019-12-14 MED ORDER — CLONAZEPAM 0.25 MG PO TBDP: 0.5000 mg | ORAL_TABLET | Freq: Every day | ORAL | 5 refills | Status: DC

## 2019-12-14 NOTE — Telephone Encounter (Signed)
Rx for clonazepam updated to 0.25 mg strength take 2 pills each bedtime.

## 2019-12-14 NOTE — Telephone Encounter (Signed)
Pt notified via my chart that rx has been sent.

## 2019-12-25 ENCOUNTER — Other Ambulatory Visit: Payer: Medicare Other

## 2019-12-30 DIAGNOSIS — Z23 Encounter for immunization: Secondary | ICD-10-CM | POA: Diagnosis not present

## 2020-01-01 DIAGNOSIS — N5201 Erectile dysfunction due to arterial insufficiency: Secondary | ICD-10-CM | POA: Diagnosis not present

## 2020-01-01 DIAGNOSIS — N35011 Post-traumatic bulbous urethral stricture: Secondary | ICD-10-CM | POA: Diagnosis not present

## 2020-01-13 DIAGNOSIS — Z23 Encounter for immunization: Secondary | ICD-10-CM | POA: Diagnosis not present

## 2020-03-06 DIAGNOSIS — G20 Parkinson's disease: Secondary | ICD-10-CM | POA: Diagnosis not present

## 2020-03-06 DIAGNOSIS — G249 Dystonia, unspecified: Secondary | ICD-10-CM | POA: Diagnosis not present

## 2020-03-06 DIAGNOSIS — Z79899 Other long term (current) drug therapy: Secondary | ICD-10-CM | POA: Diagnosis not present

## 2020-03-14 ENCOUNTER — Ambulatory Visit: Payer: Medicare Other | Admitting: Physical Therapy

## 2020-03-14 ENCOUNTER — Other Ambulatory Visit: Payer: Self-pay

## 2020-03-14 ENCOUNTER — Ambulatory Visit: Payer: Medicare Other | Attending: Orthopaedic Surgery | Admitting: Occupational Therapy

## 2020-03-14 ENCOUNTER — Telehealth: Payer: Self-pay | Admitting: Physical Therapy

## 2020-03-14 ENCOUNTER — Encounter: Payer: Self-pay | Admitting: *Deleted

## 2020-03-14 ENCOUNTER — Ambulatory Visit: Payer: Medicare Other | Admitting: Occupational Therapy

## 2020-03-14 DIAGNOSIS — G20 Parkinson's disease: Secondary | ICD-10-CM

## 2020-03-14 DIAGNOSIS — R2689 Other abnormalities of gait and mobility: Secondary | ICD-10-CM | POA: Insufficient documentation

## 2020-03-14 DIAGNOSIS — R498 Other voice and resonance disorders: Secondary | ICD-10-CM

## 2020-03-14 DIAGNOSIS — G20A1 Parkinson's disease without dyskinesia, without mention of fluctuations: Secondary | ICD-10-CM

## 2020-03-14 DIAGNOSIS — R278 Other lack of coordination: Secondary | ICD-10-CM | POA: Insufficient documentation

## 2020-03-14 NOTE — Telephone Encounter (Signed)
Mr. Brodhead was seen in our clinic this morning for multi-disciplinary for PT, OT, and screens.  Speech therapist not present to perform screen, but PT asked questions regarding potential need for speech therapy and pt reports due to decreased voice volume and difficulty with people understanding him.  If you agree, please write order for speech therapy via Epic.  Thank you.  Mady Haagensen, PT 03/14/20 11:13 AM Phone: [phone] Fax: [phone]

## 2020-03-14 NOTE — Telephone Encounter (Signed)
Sent my chart to advise of referral placed.

## 2020-03-14 NOTE — Therapy (Signed)
[facility] [address] [place], [name], [address] Phone: [phone]   Fax:  [phone]  Patient Details  Name: Ronald Noble MRN: [id number] Date of Birth: January 31, 1946 Referring Provider:  [name], MD  Encounter Date: 03/14/2020  Physical Therapy Parkinson's Disease Screen   Timed Up and Go test: 8.88  10 meter walk test: 8.4 sec = 3.9 ft/sec  5 time sit to stand test: 9.59 sec   Have had a few falls in the past 6 months.  Doing my exercises -stretches and bike exercises.    Patient does not require Physical Therapy services at this time.  Recommend Physical Therapy screen in 6-9 months.  Asked speech-related questions, and pt feels he is talking fast and low and people have to ask him to repeat himself.  Pt in agreement to ask for speech order.    Ronald Giangregorio W. 03/14/2020, 8:20 AM  Frazier Butt., PT  [facility] [address] [place], [name], [address] Phone: [phone]   Fax:  [phone]

## 2020-03-14 NOTE — Therapy (Signed)
[facility] [address] [place], [name], [address] Phone: [phone]   Fax:  [phone]  Patient Details  Name: Ronald Noble MRN: [id number] Date of Birth: 1945/11/02 Referring Provider:  [name], MD  Encounter Date: 03/14/2020 Occupational Therapy Parkinson's Disease Screen    Physical Performance Test item #2 (simulated eating):  7.50 sec  Physical Performance Test item #4 (donning/doffing jacket):  9.72 sec   9-hole peg test:    RUE  32.50 sec        LUE  32.44 sec   Change in ability to perform ADLs/IADLs:  No, handwriting is legible and good letter size.     Pt does not require occupational therapy services at this time.  Recommended occupational therapy screen in   6-8 months Shanea Karney 03/14/2020, 11:40 AM  [facility] [address] [place], [name], [address] Phone: [phone]   Fax:  [phone]

## 2020-03-14 NOTE — Telephone Encounter (Signed)
I placed a referral to neuro rehab speech therapy as per patient request.  Patient with worsening hypophonia in the context of Parkinson's disease.

## 2020-03-25 DIAGNOSIS — Z20822 Contact with and (suspected) exposure to covid-19: Secondary | ICD-10-CM | POA: Diagnosis not present

## 2020-03-25 NOTE — Telephone Encounter (Signed)
Ronald Noble , Can you place order's For PT , SP and OT . All orders have to be placed separately . They are trying to bring him in in the morning . Thanks [employer].  I will have to call Anderson Malta and the patient patient back . [phone].

## 2020-03-25 NOTE — Telephone Encounter (Signed)
Order for SP placed in [facility].

## 2020-03-25 NOTE — Telephone Encounter (Signed)
Thanks Cyril Mourning so Much . Called and spoke to patient he does not want to go this week to have speech .

## 2020-03-25 NOTE — Addendum Note (Signed)
Addended by: Lester [place] A on: 03/25/2020 11:54 AM   Modules accepted: Orders

## 2020-03-26 ENCOUNTER — Other Ambulatory Visit: Payer: Self-pay | Admitting: Internal Medicine

## 2020-03-26 ENCOUNTER — Telehealth: Payer: Self-pay | Admitting: Neurology

## 2020-03-26 DIAGNOSIS — E785 Hyperlipidemia, unspecified: Secondary | ICD-10-CM

## 2020-03-26 NOTE — Telephone Encounter (Signed)
I called pt to discuss. No answer, left a message asking him to call me back. 

## 2020-03-26 NOTE — Telephone Encounter (Signed)
Pt is asking for a call to discuss Covid-19 concerns with RN for Dr Rexene Alberts, please call

## 2020-03-26 NOTE — Telephone Encounter (Signed)
I called pt. His step son was exposed to [employer] on 03/24/2020 but has tested negative. Pt wants to know if it is safe to watch him on 03/28/2020. I asked pt to discuss this further with his PCP office. Pt verbalized understanding.

## 2020-04-12 DIAGNOSIS — N35011 Post-traumatic bulbous urethral stricture: Secondary | ICD-10-CM | POA: Diagnosis not present

## 2020-04-18 ENCOUNTER — Other Ambulatory Visit: Payer: Self-pay | Admitting: Urology

## 2020-04-24 ENCOUNTER — Encounter (HOSPITAL_BASED_OUTPATIENT_CLINIC_OR_DEPARTMENT_OTHER): Payer: Self-pay | Admitting: Urology

## 2020-04-24 ENCOUNTER — Other Ambulatory Visit: Payer: Self-pay

## 2020-04-24 ENCOUNTER — Other Ambulatory Visit (HOSPITAL_COMMUNITY)
Admission: RE | Admit: 2020-04-24 | Discharge: 2020-04-24 | Disposition: A | Discharge status: Home / Self Care | Payer: Medicare Other | Source: Ambulatory Visit | Attending: Urology | Admitting: Urology

## 2020-04-24 DIAGNOSIS — U071 COVID-19: Secondary | ICD-10-CM | POA: Diagnosis not present

## 2020-04-24 DIAGNOSIS — Z01812 Encounter for preprocedural laboratory examination: Secondary | ICD-10-CM | POA: Diagnosis not present

## 2020-04-24 HISTORY — DX: COVID-19: U07.1

## 2020-04-24 LAB — SARS CORONAVIRUS 2 (TAT 6-24 HRS): SARS Coronavirus 2: POSITIVE — AB

## 2020-04-24 NOTE — H&P (Signed)
H&P  Chief Complaint: Difficulty urinating  History of Present Illness:  75 year old male status post TURP years ago.  He developed a stricture which was dilated  In 2021.  He now has a recurrent urethral stricture and presents for repeat cystoscopy and dilation  Past Medical History:  Diagnosis Date  . BPH (benign prostatic hyperplasia)   . Parkinson disease ([place])   . Skin cancer of face    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  . Sleep apnea    mild no cpap used  . Urethral stricture     Past Surgical History:  Procedure Laterality Date  . BURR HOLE W/ STEREOTACTIC INSERTION OF DBS LEADS / INTRAOP MICROELECTRODE RECORDING  04/2014   [facility], [facility]  . COLONOSCOPY  2019  . CYSTOSCOPY WITH URETHRAL DILATATION N/A 06/15/2019   Procedure: CYSTOSCOPY WITH URETHRAL DILATATION;  Surgeon: Franchot Gallo, MD;  [facility];  Service: Urology;  Laterality: N/A;  30 MINS  . KNEE SURGERY  yrs ago   exploratory/left  . SKIN CANCER EXCISION  01/2014   face  . TRANSURETHRAL RESECTION OF PROSTATE     2008    Home Medications:  Allergies as of 04/24/2020   No Known Allergies     Medication List    Notice   Cannot display discharge medications because the patient has not yet been admitted.     Allergies: No Known Allergies  Family History  Problem Relation Age of Onset  . COPD Mother   . Cancer Father        Bladder  . Colon cancer Neg Hx   . Esophageal cancer Neg Hx   . Stomach cancer Neg Hx     Social History:  reports that he has never smoked. He has never used smokeless tobacco. He reports current alcohol use of about 5.0 standard drinks of alcohol per week. He reports that he does not use drugs.  ROS: A complete review of systems was performed.  All systems are negative except for pertinent findings as noted.  Physical Exam:  Vital signs in last 24 hours: Ht 5\' 11"  (1.803 m)   Wt 69.9 kg   BMI 21.48 kg/m   Constitutional:  Alert and oriented, No acute distress Cardiovascular: Regular rate  Respiratory: Normal respiratory effort GI: Abdomen is soft, nontender, nondistended, no abdominal masses. No CVAT.  Genitourinary: Normal male phallus, testes are descended bilaterally and non-tender and without masses, scrotum is normal in appearance without lesions or masses, perineum is normal on inspection. Lymphatic: No lymphadenopathy Neurologic: Grossly intact, no focal deficits Psychiatric: Normal mood and affect  Laboratory Data:  No results for input(s): WBC, HGB, HCT, PLT in the last 72 hours.  No results for input(s): NA, K, CL, GLUCOSE, BUN, CALCIUM, CREATININE in the last 72 hours.  Invalid input(s): CO3   No results found for this or any previous visit (from the past 24 hour(s)). No results found for this or any previous visit (from the past 240 hour(s)).  Renal Function: No results for input(s): CREATININE in the last 168 hours. CrCl cannot be calculated (Patient's most recent lab result is older than the maximum 21 days allowed.).  Radiologic Imaging: No results found.  Impression/Assessment:   recurrent urethral stricture  Plan:   cystoscopy, balloon dilation of urethral stricture

## 2020-04-24 NOTE — Progress Notes (Signed)
Spoke w/ via phone for pre-op interview---pt Lab needs dos---- I stat 8              Lab results------lov dr Rexene Alberts 11-23-2019 epic COVID test -----04-24-2020 905 am- Arrive at -------845 am 04-25-2020 NPO after MN NO Solid Food.  Clear liquids from MN until---745 am then npo Medications to take morning of surgery -----amantadine, carbidopa-levidopa Diabetic medication -----n/a Patient Special Instructions -----none Pre-Op special Istructions -----none Patient verbalized understanding of instructions that were given at this phone interview. Patient denies shortness of breath, chest pain, fever, cough at this phone interview.  Patient has ring stuck on left 4th ring finger

## 2020-04-25 DIAGNOSIS — Z1159 Encounter for screening for other viral diseases: Secondary | ICD-10-CM | POA: Diagnosis not present

## 2020-04-25 NOTE — Progress Notes (Signed)
Patient positive for COVID, resutls called to [facility] spoke with Surgery scheduler. Patient is scheduled for surgery today

## 2020-04-29 DIAGNOSIS — Z1159 Encounter for screening for other viral diseases: Secondary | ICD-10-CM | POA: Diagnosis not present

## 2020-05-07 ENCOUNTER — Encounter (HOSPITAL_BASED_OUTPATIENT_CLINIC_OR_DEPARTMENT_OTHER): Payer: Self-pay | Admitting: Urology

## 2020-05-07 NOTE — Progress Notes (Addendum)
Spoke w/ via phone for pre-op interview---pt Lab needs dos---- none              Lab results------lov dr Rexene Alberts 11-23-2019 epic COVID test -----04-24-2020 905 am-posiitve no retest needed Arrive at ------730 am 05-09-2020 NPO after MN NO Solid Food.  Clear liquids from MN until---630  am then npo Medications to take morning of surgery ----pt wishes to take no meds am of surgery Diabetic medication -----n/a Patient Special Instructions -----none Pre-Op special Istructions -----none Patient verbalized understanding of instructions that were given at this phone interview. Patient denies shortness of breath, chest pain, fever, cough at this phone interview.  Patient has ring stuck on left 4th ring finger

## 2020-05-09 ENCOUNTER — Encounter (HOSPITAL_BASED_OUTPATIENT_CLINIC_OR_DEPARTMENT_OTHER)
Admission: RE | Disposition: A | Discharge status: Home / Self Care | Payer: Self-pay | Source: Home / Self Care | Attending: Urology

## 2020-05-09 ENCOUNTER — Ambulatory Visit (HOSPITAL_BASED_OUTPATIENT_CLINIC_OR_DEPARTMENT_OTHER)
Admission: RE | Admit: 2020-05-09 | Discharge: 2020-05-09 | Disposition: A | Discharge status: Home / Self Care | Payer: Medicare Other | Attending: Urology | Admitting: Urology

## 2020-05-09 ENCOUNTER — Encounter (HOSPITAL_BASED_OUTPATIENT_CLINIC_OR_DEPARTMENT_OTHER): Payer: Self-pay | Admitting: Urology

## 2020-05-09 ENCOUNTER — Ambulatory Visit (HOSPITAL_BASED_OUTPATIENT_CLINIC_OR_DEPARTMENT_OTHER): Payer: Medicare Other | Admitting: Anesthesiology

## 2020-05-09 DIAGNOSIS — G20 Parkinson's disease: Secondary | ICD-10-CM | POA: Diagnosis not present

## 2020-05-09 DIAGNOSIS — N35919 Unspecified urethral stricture, male, unspecified site: Secondary | ICD-10-CM | POA: Diagnosis not present

## 2020-05-09 DIAGNOSIS — G4730 Sleep apnea, unspecified: Secondary | ICD-10-CM | POA: Diagnosis not present

## 2020-05-09 DIAGNOSIS — N35011 Post-traumatic bulbous urethral stricture: Secondary | ICD-10-CM | POA: Diagnosis not present

## 2020-05-09 DIAGNOSIS — N400 Enlarged prostate without lower urinary tract symptoms: Secondary | ICD-10-CM | POA: Diagnosis not present

## 2020-05-09 HISTORY — PX: CYSTOSCOPY WITH URETHRAL DILATATION: SHX5125

## 2020-05-09 SURGERY — CYSTOSCOPY, WITH URETHRAL DILATION
Anesthesia: General | Site: Urethra

## 2020-05-09 MED ORDER — OXYCODONE HCL 5 MG/5ML PO SOLN: 5.0000 mg | Freq: Once | ORAL | Status: DC | PRN

## 2020-05-09 MED ORDER — CEFAZOLIN SODIUM-DEXTROSE 2-4 GM/100ML-% IV SOLN
2.0000 g | INTRAVENOUS | Status: AC
  Administered 2020-05-09: 2 g via INTRAVENOUS

## 2020-05-09 MED ORDER — STERILE WATER FOR IRRIGATION IR SOLN
Status: DC | PRN
  Administered 2020-05-09: 3000 mL

## 2020-05-09 MED ORDER — PHENYLEPHRINE 40 MCG/ML (10ML) SYRINGE FOR IV PUSH (FOR BLOOD PRESSURE SUPPORT)
PREFILLED_SYRINGE | INTRAVENOUS | Status: AC
  Filled 2020-05-09: qty 10

## 2020-05-09 MED ORDER — ONDANSETRON HCL 4 MG/2ML IJ SOLN
INTRAMUSCULAR | Status: DC | PRN
  Administered 2020-05-09: 4 mg via INTRAVENOUS

## 2020-05-09 MED ORDER — CEPHALEXIN 500 MG PO CAPS: 500.0000 mg | ORAL_CAPSULE | Freq: Two times a day (BID) | ORAL | 0 refills | Status: AC

## 2020-05-09 MED ORDER — PROPOFOL 10 MG/ML IV BOLUS
INTRAVENOUS | Status: DC | PRN
  Administered 2020-05-09: 100 mg via INTRAVENOUS
  Administered 2020-05-09: 50 mg via INTRAVENOUS

## 2020-05-09 MED ORDER — EPHEDRINE 5 MG/ML INJ
INTRAVENOUS | Status: AC
  Filled 2020-05-09: qty 10

## 2020-05-09 MED ORDER — OXYCODONE HCL 5 MG PO TABS: 5.0000 mg | ORAL_TABLET | Freq: Once | ORAL | Status: DC | PRN

## 2020-05-09 MED ORDER — FENTANYL CITRATE (PF) 100 MCG/2ML IJ SOLN
INTRAMUSCULAR | Status: DC | PRN
  Administered 2020-05-09: 25 ug via INTRAVENOUS
  Administered 2020-05-09: 50 ug via INTRAVENOUS

## 2020-05-09 MED ORDER — CEFAZOLIN SODIUM-DEXTROSE 2-4 GM/100ML-% IV SOLN
INTRAVENOUS | Status: AC
  Filled 2020-05-09: qty 100

## 2020-05-09 MED ORDER — PROPOFOL 10 MG/ML IV BOLUS
INTRAVENOUS | Status: AC
  Filled 2020-05-09: qty 40

## 2020-05-09 MED ORDER — FENTANYL CITRATE (PF) 100 MCG/2ML IJ SOLN
INTRAMUSCULAR | Status: AC
  Filled 2020-05-09: qty 2

## 2020-05-09 MED ORDER — LACTATED RINGERS IV SOLN
INTRAVENOUS | Status: DC
  Administered 2020-05-09: 1000 mL via INTRAVENOUS

## 2020-05-09 MED ORDER — FENTANYL CITRATE (PF) 100 MCG/2ML IJ SOLN: 25.0000 ug | INTRAMUSCULAR | Status: DC | PRN

## 2020-05-09 MED ORDER — LIDOCAINE HCL (CARDIAC) PF 100 MG/5ML IV SOSY
PREFILLED_SYRINGE | INTRAVENOUS | Status: DC | PRN
  Administered 2020-05-09: 100 mg via INTRAVENOUS

## 2020-05-09 MED ORDER — ONDANSETRON HCL 4 MG/2ML IJ SOLN
INTRAMUSCULAR | Status: AC
  Filled 2020-05-09: qty 2

## 2020-05-09 MED ORDER — ONDANSETRON HCL 4 MG/2ML IJ SOLN: 4.0000 mg | Freq: Once | INTRAMUSCULAR | Status: DC | PRN

## 2020-05-09 MED ORDER — SODIUM CHLORIDE 0.9 % IV SOLN
INTRAVENOUS | Status: DC | PRN
  Administered 2020-05-09: 10 mL

## 2020-05-09 SURGICAL SUPPLY — 16 items
BAG DRAIN URO-CYSTO SKYTR STRL (DRAIN) ×2 IMPLANT
BAG DRN UROCATH (DRAIN) ×1
BALLN NEPHROSTOMY (BALLOONS) ×2
BALLOON NEPHROSTOMY (BALLOONS) IMPLANT
CLOTH BEACON ORANGE TIMEOUT ST (SAFETY) ×3 IMPLANT
GLOVE SURG ENC MOIS LTX SZ6.5 (GLOVE) ×1 IMPLANT
GLOVE SURG ENC MOIS LTX SZ8 (GLOVE) ×2 IMPLANT
GLOVE SURG UNDER POLY LF SZ7 (GLOVE) ×1 IMPLANT
GOWN STRL REUS W/TWL LRG LVL3 (GOWN DISPOSABLE) ×4 IMPLANT
GUIDEWIRE STR DUAL SENSOR (WIRE) ×1 IMPLANT
KIT TURNOVER CYSTO (KITS) ×2 IMPLANT
MANIFOLD NEPTUNE II (INSTRUMENTS) ×2 IMPLANT
NS IRRIG 500ML POUR BTL (IV SOLUTION) ×1 IMPLANT
PACK CYSTO (CUSTOM PROCEDURE TRAY) ×2 IMPLANT
TUBE CONNECTING 12X1/4 (SUCTIONS) ×1 IMPLANT
WATER STERILE IRR 3000ML UROMA (IV SOLUTION) ×2 IMPLANT

## 2020-05-09 NOTE — Anesthesia Preprocedure Evaluation (Signed)
Anesthesia Evaluation  Patient identified by MRN, date of birth, ID band Patient awake    Reviewed: Allergy & Precautions, H&P , NPO status , Patient's Chart, lab work & pertinent test results  Airway Mallampati: II  TM Distance: >3 FB Neck ROM: Full    Dental no notable dental hx.    Pulmonary neg pulmonary ROS,    Pulmonary exam normal breath sounds clear to auscultation       Cardiovascular negative cardio ROS Normal cardiovascular exam Rhythm:Regular Rate:Normal     Neuro/Psych parkinsons dz  Neuromuscular disease negative psych ROS   GI/Hepatic negative GI ROS, Neg liver ROS,   Endo/Other  negative endocrine ROS  Renal/GU negative Renal ROS  negative genitourinary   Musculoskeletal negative musculoskeletal ROS (+)   Abdominal   Peds negative pediatric ROS (+)  Hematology negative hematology ROS (+)   Anesthesia Other Findings   Reproductive/Obstetrics negative OB ROS                             Anesthesia Physical Anesthesia Plan  ASA: II  Anesthesia Plan: General   Post-op Pain Management:    Induction: Intravenous  PONV Risk Score and Plan: 2 and Ondansetron, Dexamethasone and Treatment may vary due to age or medical condition  Airway Management Planned: LMA  Additional Equipment:   Intra-op Plan:   Post-operative Plan: Extubation in OR  Informed Consent: I have reviewed the patients History and Physical, chart, labs and discussed the procedure including the risks, benefits and alternatives for the proposed anesthesia with the patient or authorized representative who has indicated his/her understanding and acceptance.     Dental advisory given  Plan Discussed with: CRNA and Surgeon  Anesthesia Plan Comments: (COVID 04/24/2020 scratchy throat x 1 day all symptoms resolved )        Anesthesia Quick Evaluation

## 2020-05-09 NOTE — Anesthesia Postprocedure Evaluation (Signed)
Anesthesia Post Note  Patient: Ronald Noble  Procedure(s) Performed: CYSTOSCOPY WITH BALLOON DILATATION OF URETHRAL STRICTURE (N/A Urethra)     Patient location during evaluation: PACU Anesthesia Type: General Level of consciousness: awake and alert Pain management: pain level controlled Vital Signs Assessment: post-procedure vital signs reviewed and stable Respiratory status: spontaneous breathing, nonlabored ventilation, respiratory function stable and patient connected to nasal cannula oxygen Cardiovascular status: blood pressure returned to baseline and stable Postop Assessment: no apparent nausea or vomiting Anesthetic complications: no   No complications documented.  Last Vitals:  Vitals:   05/09/20 1030 05/09/20 1045  BP: (!) 154/71 (!) 141/76  Pulse: (!) 59 (!) 54  Resp: (!) 8 10  Temp:    SpO2: 100% 97%    Last Pain:  Vitals:   05/09/20 1045  TempSrc:   PainSc: 0-No pain                 Howard Patton S

## 2020-05-09 NOTE — Transfer of Care (Signed)
Immediate Anesthesia Transfer of Care Note  Patient: Ronald Noble  Procedure(s) Performed: CYSTOSCOPY WITH BALLOON DILATATION OF URETHRAL STRICTURE (N/A Urethra)  Patient Location: PACU  Anesthesia Type:General  Level of Consciousness: drowsy and patient cooperative  Airway & Oxygen Therapy: Patient Spontanous Breathing and Patient connected to face mask oxygen  Post-op Assessment: Report given to RN and Post -op Vital signs reviewed and stable  Post vital signs: Reviewed and stable  Last Vitals:  Vitals Value Taken Time  BP 131/74 05/09/20 1013  Temp    Pulse 52 05/09/20 1015  Resp 8 05/09/20 1015  SpO2 100 % 05/09/20 1015  Vitals shown include unvalidated device data.  Last Pain:  Vitals:   05/09/20 0815  TempSrc: Oral  PainSc: 0-No pain      Patients Stated Pain Goal: 7 (83/25/49 8264)  Complications: No complications documented.

## 2020-05-09 NOTE — H&P (Signed)
H&P  Chief Complaint: Difficulty with urination  History of Present Illness: 75 year old male presents at this time for cystoscopy and dilation of a urethral stricture.  He has a history of transurethral resection of his prostate.  He developed bladder neck contracture, and has previously had 1 balloon dilation.  He presents for repeat procedure with recent cystoscopy revealing a tight stricture.  Past Medical History:  Diagnosis Date  . BPH (benign prostatic hyperplasia)   . COVID 04/24/2020   scratchy throat x 1 day all symptoms resolved  . Parkinson disease ([place])   . Skin cancer of face    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  . Sleep apnea    mild no cpap used  . Urethral stricture     Past Surgical History:  Procedure Laterality Date  . BURR HOLE W/ STEREOTACTIC INSERTION OF DBS LEADS / INTRAOP MICROELECTRODE RECORDING  04/2014   [facility]  . COLONOSCOPY  2019  . CYSTOSCOPY WITH URETHRAL DILATATION N/A 06/15/2019   Procedure: CYSTOSCOPY WITH URETHRAL DILATATION;  Surgeon: Franchot Gallo, MD;  Location: [facility];  Service: Urology;  Laterality: N/A;  30 MINS  . KNEE SURGERY  yrs ago   exploratory/left  . SKIN CANCER EXCISION  01/2014   face  . TRANSURETHRAL RESECTION OF PROSTATE     2008    Home Medications:    Allergies: No Known Allergies  Family History  Problem Relation Age of Onset  . COPD Mother   . Cancer Father        Bladder  . Colon cancer Neg Hx   . Esophageal cancer Neg Hx   . Stomach cancer Neg Hx     Social History:  reports that he has never smoked. He has never used smokeless tobacco. He reports current alcohol use of about 5.0 standard drinks of alcohol per week. He reports that he does not use drugs.  ROS: A complete review of systems was performed.  All systems are negative except for pertinent findings as noted.  Physical Exam:  Vital signs in last 24 hours: BP 134/67   Pulse (!)  58   Temp 98.6 F (37 C) (Oral)   Resp 14   Ht 5\' 11"  (1.803 m)   Wt 69 kg   SpO2 99%   BMI 21.21 kg/m  Constitutional:  Alert and oriented, No acute distress Cardiovascular: Regular rate  Respiratory: Normal respiratory effort GI: Abdomen is soft, nontender, nondistended, no abdominal masses. No CVAT.  Genitourinary: Normal male phallus, testes are descended bilaterally and non-tender and without masses, scrotum is normal in appearance without lesions or masses, perineum is normal on inspection. Lymphatic: No lymphadenopathy Neurologic: Grossly intact, no focal deficits Psychiatric: Normal mood and affect  Laboratory Data:  No results for input(s): WBC, HGB, HCT, PLT in the last 72 hours.  No results for input(s): NA, K, CL, GLUCOSE, BUN, CALCIUM, CREATININE in the last 72 hours.  Invalid input(s): CO3   No results found for this or any previous visit (from the past 24 hour(s)). No results found for this or any previous visit (from the past 240 hour(s)).  Renal Function: No results for input(s): CREATININE in the last 168 hours. CrCl cannot be calculated (Patient's most recent lab result is older than the maximum 21 days allowed.).  Radiologic Imaging: No results found.  Impression/Assessment:  Symptomatic urethral stricture  Plan:  Cystoscopy, balloon dilation

## 2020-05-09 NOTE — Anesthesia Procedure Notes (Signed)
Procedure Name: LMA Insertion Date/Time: 05/09/2020 9:39 AM Performed by: Georgeanne Nim, CRNA Pre-anesthesia Checklist: Patient identified, Emergency Drugs available, Suction available, Patient being monitored and Timeout performed Patient Re-evaluated:Patient Re-evaluated prior to induction Oxygen Delivery Method: Circle system utilized Preoxygenation: Pre-oxygenation with 100% oxygen Induction Type: IV induction Ventilation: Mask ventilation without difficulty LMA: LMA inserted LMA Size: 4.0 Number of attempts: 1 Placement Confirmation: positive ETCO2,  CO2 detector and breath sounds checked- equal and bilateral Tube secured with: Tape Dental Injury: Teeth and Oropharynx as per pre-operative assessment

## 2020-05-09 NOTE — Interval H&P Note (Signed)
History and Physical Interval Note:  05/09/2020 9:28 AM  Ronald Noble  has presented today for surgery, with the diagnosis of URETHRAL STRICTURE.  The various methods of treatment have been discussed with the patient and family. After consideration of risks, benefits and other options for treatment, the patient has consented to  Procedure(s) with comments: [place] (N/A) - 30 MINS as a surgical intervention.  The patient's history has been reviewed, patient examined, no change in status, stable for surgery.  I have reviewed the patient's chart and labs.  Questions were answered to the patient's satisfaction.     Lillette Boxer Shuan Statzer

## 2020-05-09 NOTE — Discharge Instructions (Signed)
Cystoscopy Cystoscopy is a procedure that is used to help diagnose and sometimes treat conditions that affect the lower urinary tract. The lower urinary tract includes the bladder and the urethra. The urethra is the tube that drains urine from the bladder. Cystoscopy is done using a thin, tube-shaped instrument with a light and camera at the end (cystoscope). The cystoscope may be hard or flexible, depending on the goal of the procedure. The cystoscope is inserted through the urethra, into the bladder. Cystoscopy may be recommended if you have:  Urinary tract infections that keep coming back.  Blood in the urine (hematuria).  An inability to control when you urinate (urinary incontinence) or an overactive bladder.  Unusual cells found in a urine sample.  A blockage in the urethra, such as a urinary stone.  Painful urination.  An abnormality in the bladder found during an intravenous pyelogram (IVP) or CT scan. Cystoscopy may also be done to remove a sample of tissue to be examined under a microscope (biopsy). Tell a health care provider about:  Any allergies you have.  All medicines you are taking, including vitamins, herbs, eye drops, creams, and over-the-counter medicines.  Any problems you or family members have had with anesthetic medicines.  Any blood disorders you have.  Any surgeries you have had.  Any medical conditions you have.  Whether you are pregnant or may be pregnant. What are the risks? Generally, this is a safe procedure. However, problems may occur, including:  Infection.  Bleeding.  Allergic reactions to medicines.  Damage to other structures or organs. What happens before the procedure? Medicines Ask your health care provider about:  Changing or stopping your regular medicines. This is especially important if you are taking diabetes medicines or blood thinners.  Taking medicines such as aspirin and ibuprofen. These medicines can thin your blood. Do  not take these medicines unless your health care provider tells you to take them.  Taking over-the-counter medicines, vitamins, herbs, and supplements. Tests You may have an exam or testing, such as:  X-rays of the bladder, urethra, or kidneys.  CT scan of the abdomen or pelvis.  Urine tests to check for signs of infection. General instructions  Follow instructions from your health care provider about eating or drinking restrictions.  Ask your health care provider what steps will be taken to help prevent infection. These steps may include: ? Washing skin with a germ-killing soap. ? Taking antibiotic medicine.  Plan to have a responsible adult take you home from the hospital or clinic. What happens during the procedure?  You will be given one or more of the following: ? A medicine to help you relax (sedative). ? A medicine to numb the area (local anesthetic).  The area around the opening of your urethra will be cleaned.  The cystoscope will be passed through your urethra into your bladder.  Germ-free (sterile) fluid will flow through the cystoscope to fill your bladder. The fluid will stretch your bladder so that your health care provider can clearly examine your bladder walls.  Your doctor will look at the urethra and bladder. Your doctor may take a biopsy or remove stones.  The cystoscope will be removed, and your bladder will be emptied. The procedure may vary among health care providers and hospitals.   What can I expect after the procedure? After the procedure, it is common to have:  Some soreness or pain in your abdomen and urethra.  Urinary symptoms. These include: ? Mild pain or burning  when you urinate. Pain should stop within a few minutes after you urinate. This may last for up to 1 week. ? A small amount of blood in your urine for several days. ? Feeling like you need to urinate but producing only a small amount of urine. Follow these instructions at  home: Medicines  Take over-the-counter and prescription medicines only as told by your health care provider.  If you were prescribed an antibiotic medicine, take it as told by your health care provider. Do not stop taking the antibiotic even if you start to feel better. General instructions  Return to your normal activities as told by your health care provider. Ask your health care provider what activities are safe for you.  If you were given a sedative during the procedure, it can affect you for several hours. Do not drive or operate machinery until your health care provider says that it is safe.  Watch for any blood in your urine. If the amount of blood in your urine increases, call your health care provider.  Follow instructions from your health care provider about eating or drinking restrictions.  If a tissue sample was removed for testing (biopsy) during your procedure, it is up to you to get your test results. Ask your health care provider, or the department that is doing the test, when your results will be ready.  Drink enough fluid to keep your urine pale yellow.  Keep all follow-up visits. This is important. Contact a health care provider if:  You have pain that gets worse or does not get better with medicine, especially pain when you urinate.  You have trouble urinating.  You have more blood in your urine. Get help right away if:  You have blood clots in your urine.  You have abdominal pain.  You have a fever or chills.  You are unable to urinate. Summary  Cystoscopy is a procedure that is used to help diagnose and sometimes treat conditions that affect the lower urinary tract.  Cystoscopy is done using a thin, tube-shaped instrument with a light and camera at the end.  After the procedure, it is common to have some soreness or pain in your abdomen and urethra.  Watch for any blood in your urine. If the amount of blood in your urine increases, call your health  care provider.  If you were prescribed an antibiotic medicine, take it as told by your health care provider. Do not stop taking the antibiotic even if you start to feel better. This information is not intended to replace advice given to you by your health care provider. Make sure you discuss any questions you have with your health care provider. Document Revised: 11/03/2019 Document Reviewed: 11/03/2019 Elsevier Patient Education  Cook Instructions  Activity: Get plenty of rest for the remainder of the day. A responsible individual must stay with you for 24 hours following the procedure.  For the next 24 hours, DO NOT: -Drive a car -Paediatric nurse -Drink alcoholic beverages -Take any medication unless instructed by your physician -Make any legal decisions or sign important papers.  Meals: Start with liquid foods such as gelatin or soup. Progress to regular foods as tolerated. Avoid greasy, spicy, heavy foods. If nausea and/or vomiting occur, drink only clear liquids until the nausea and/or vomiting subsides. Call your physician if vomiting continues.  Special Instructions/Symptoms: Your throat may feel dry or sore from the anesthesia or the breathing  tube placed in your throat during surgery. If this causes discomfort, gargle with warm salt water. The discomfort should disappear within 24 hours.

## 2020-05-09 NOTE — Addendum Note (Signed)
Addendum  created 05/09/20 1108 by Georgeanne Nim, CRNA   Flowsheet accepted, Intraprocedure Event edited, Intraprocedure Flowsheets edited

## 2020-05-09 NOTE — Op Note (Signed)
Preoperative diagnosis: Posterior urethral stricture  Postoperative diagnosis: Same  Principal procedure: Cystoscopy, balloon dilation of posterior urethral stricture, fluoroscopic interpretation  Surgeon: Braxden Lovering  Anesthesia: General with LMA  Complications: None  Specimen: None  Drains: None  Indications: 75 year old male status post TURP in 2008.  He has had a recurrent urethral stricture with significant symptomatology.  He presents at this time for balloon dilation.  I discussed the procedure with the patient as well as expected outcome and risks and complications.  He understands and desires to proceed.  Description of procedure: The patient was properly identified in the holding area.  He is taken to the operating room where general anesthetic was administered with the LMA.  He was placed in the dorsolithotomy position.  Genitalia and perineum were prepped and draped, proper timeout performed.  21 French panendoscope advanced under direct vision through the urethra with the stricture seen in the posterior/bulbous urethra.  This was traversed with a sensor tip guidewire which was advanced into the bladder using fluoroscopic localization.  Once the guidewire was adequately positioned, the scope was removed with the guidewire left in place.  I then negotiated a 8 [place] NephroMax balloon over top of the guidewire and positioned this appropriately across the stricture using fluoroscopic guidance.  The balloon was then inflated to 20 atm of pressure, left inflated for 5 minutes.  It was then deflated and both the guidewire and the balloon catheter were removed.  I then passed the cystoscope easily through this treated area and into the bladder.  There was mild nodular regrowth of the prostate.  There were moderate trabeculations in the bladder.  No urothelial lesions were noted.  Ureteral orifice ease were normal in configuration.  The bladder was mostly drained at this point.  Scope was  removed and the procedure terminated.  The patient was awakened, taken to the PACU in stable condition, having tolerated the procedure well.

## 2020-05-10 ENCOUNTER — Encounter (HOSPITAL_BASED_OUTPATIENT_CLINIC_OR_DEPARTMENT_OTHER): Payer: Self-pay | Admitting: Urology

## 2020-05-17 ENCOUNTER — Other Ambulatory Visit: Payer: Self-pay

## 2020-05-17 ENCOUNTER — Ambulatory Visit: Payer: Medicare Other | Attending: Orthopaedic Surgery

## 2020-05-17 DIAGNOSIS — R471 Dysarthria and anarthria: Secondary | ICD-10-CM | POA: Insufficient documentation

## 2020-05-17 DIAGNOSIS — R41841 Cognitive communication deficit: Secondary | ICD-10-CM | POA: Diagnosis not present

## 2020-05-17 NOTE — Therapy (Signed)
[facility] [address] [place], [name], [address] Phone: [phone]   Fax:  [phone]  Speech Language Pathology Evaluation  Patient Details  Name: Ronald Noble MRN: [id number] Date of Birth: [birth year] Referring Provider (SLP): Star Age, MD   Encounter Date: 05/17/2020   End of Session - 05/17/20 1132    Visit Number 1    Number of Visits 17    Date for SLP Re-Evaluation 08/15/20    SLP Start Time 0805    SLP Stop Time  0845    SLP Time Calculation (min) 40 min    Activity Tolerance Patient tolerated treatment well           Past Medical History:  Diagnosis Date  . BPH (benign prostatic hyperplasia)   . COVID 04/24/2020   scratchy throat x 1 day all symptoms resolved  . Parkinson disease ([place])   . Skin cancer of face    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  . Sleep apnea    mild no cpap used  . Urethral stricture     Past Surgical History:  Procedure Laterality Date  . BURR HOLE W/ STEREOTACTIC INSERTION OF DBS LEADS / INTRAOP MICROELECTRODE RECORDING  04/2014   [facility], [facility] leads [facility] in [facility]  . COLONOSCOPY  2019  . CYSTOSCOPY WITH URETHRAL DILATATION N/A 06/15/2019   Procedure: CYSTOSCOPY WITH URETHRAL DILATATION;  Surgeon: Franchot Gallo, MD;  Location: [facility];  Service: Urology;  Laterality: N/A;  30 MINS  . CYSTOSCOPY WITH URETHRAL DILATATION N/A 05/09/2020   Procedure: CYSTOSCOPY WITH BALLOON DILATATION OF POSTERIOR URETHRAL STRICTURE, FLUOROSCOPIC INTERPRETATION;  Surgeon: Franchot Gallo, MD;  Location: [facility];  Service: Urology;  Laterality: N/A;  . KNEE SURGERY  yrs ago   exploratory/left  . SKIN CANCER EXCISION  01/2014   face  . TRANSURETHRAL RESECTION OF PROSTATE     2008    There were no vitals filed for this visit.   Subjective Assessment - 05/17/20 0813    Subjective "When I get to speech I'm going to  flunk. I'm not speaking as distinctly as I want to."    Currently in Pain? No/denies              SLP Evaluation [facility] - 05/17/20 1937      SLP Visit Information   SLP Received On 05/17/20    Referring Provider (SLP) Star Age, MD    Onset Date 2008    Medical Diagnosis Parkinson's Disease      Subjective   Patient/Family Stated Goal "Get out of intentionality and get into habit (with loud tone of voice)."      General Information   HPI Pt well-known to this SLP from previous therapy courses for volume increase and articulatory degradation as a result of Parkinson's disease. Pt had DBS placed March 2012. He states talking at home is more difficult than it has been in the past - wife asking him to repeat more frequently now.      Prior Functional Status   Cognitive/Linguistic Baseline Baseline deficits    Baseline deficit details impulsivity, (divided) attention    Type of Home House     Lives With Spouse    Available Support Family    Education [name]    Vocation Other (Comment)   9am - 4pm     Cognition   Overall Cognitive Status History of cognitive impairments - at baseline    Attention  Divided   difficult at times for pt to target both breathing and loudness; more difficult now than 10 years ago to habitualize loudness and message content together   Problem Solving --    Behaviors Impulsive      Auditory Comprehension   Overall Auditory Comprehension Appears within functional limits for tasks assessed      Verbal Expression   Overall Verbal Expression Appears within functional limits for tasks assessed      Oral Motor/Sensory Function   Overall Oral Motor/Sensory Function Impaired    Labial Coordination Reduced    Lingual Coordination Reduced    Overall Oral Motor/Sensory Function Somewhat limited oral mech assessment due to COVID-19 risk. Pt reports that when he has "on" times he feels he has more dyskenesias of jaw and tongue.      Motor Speech   Respiration  Impaired    Level of Impairment --   chest breathing 60% "Sometimes its challenging thinking about speech and breathing"   Phonation Normal   "It's first thing in the morning so (the volume) is more normal now." However pt states beginning in mid afternoon it is challenging to maintain WNL loudness.   Intelligibility Intelligible    Effective Techniques Over-articulate;Slow rate;Increased vocal intensity                           SLP Education - 05/17/20 1059    Education Details need to do loud /a/ and everyday sentences BID and rationale for this    Person(s) Educated Patient    Methods Explanation    Comprehension Verbalized understanding;Need further instruction            SLP Short Term Goals - 05/17/20 1141      SLP SHORT TERM GOAL #1   Title Pt will  maintain average low-mid 90sdB with loud /a/ for 4 sessions with rare min A for loudness    Time 4    Period Weeks    Status New      SLP SHORT TERM GOAL #2   Title Pt will average low-mid 70sdB on structured speech tasks with rare min A for three sessions    Time 4    Period Weeks    Status New      SLP SHORT TERM GOAL #3   Title Pt will maintain average low 70s dB over 12 minute simple conversation with rare min A, for 2 sessions    Period Weeks    Status New      SLP SHORT TERM GOAL #4   Title pt will maintain speech intelligibility at 95%+ using intelligibility compensations(due to rushed speech) for 8 minutes simple conversation in three sesssions    Time 1    Period Weeks    Status New            SLP Long Term Goals - 05/17/20 1143      SLP LONG TERM GOAL #1   Title Pt will maintain low 70sdB over 10 minute mod complex conversation with rare min A for loudness in three therapy sessions    Time 8    Period Weeks   or 17 sessions   Status New      SLP LONG TERM GOAL #2   Title Pt will achieve 95%+ speech intelligibility outside of tx room (due to rushing speech) over 12 minutes mod  complex conversation with modified independence (external cues) in three sessions    Time  8    Period Weeks    Status New      SLP LONG TERM GOAL #3   Title pt will maintain average low-mod 90s dB with loud /a/ in 7 total sessions    Time 8    Status New            Plan - 05/17/20 1133    Clinical Impression Statement Hassell Done "[name]" Alvester Chou presents today with [facility] speech loudness over 40 minute evaluation however reports that speech volume declines in late afternoon so that his wife has needed to ask him more frequently for repeats of his spoken messages. [name] agrees that his speech seems softer than in the past and endorses that in the afternoons he "just (wants) to relax" due to fatigue and this plays a negative role in maintaining WNL speech loudness at home. Rarely today, (x2) pt exhibited "rushes" of speech where he combined 3-4 words in an utterance into one less-intelligible word. He noted that he needs to "shout slowly" in order to improve intelligibility.    Speech Therapy Frequency 2x / week    Duration 8 weeks   or 17 total sessions   Treatment/Interventions Compensatory techniques;Functional tasks;Cueing hierarchy;SLP instruction and feedback;Patient/family education;Internal/external aids    Potential to Achieve Goals Good    Potential Considerations Cooperation/participation level   pt has, historically, chosen to be more independent rather than always heeding SLP suggestions          Patient will benefit from skilled therapeutic intervention in order to improve the following deficits and impairments:   Dysarthria and anarthria  Cognitive communication deficit    Problem List Patient Active Problem List   Diagnosis Date Noted  . Bilateral olecranon bursitis 07/05/2019  . Other bursitis of knee, left knee 12/06/2018  . Pain in right leg 12/06/2018  . Parkinson disease ([place])     [place] ,[place], [place]  05/17/2020, 11:46 AM  [facility] [address] [place], [name], [address] Phone: [phone]   Fax:  [phone]  Name: Nahiem Dredge MRN: [id number] Date of Birth: 03/30/1946

## 2020-05-17 NOTE — Patient Instructions (Signed)
   We will work on compensations for energy conservation so it is not so difficult in the afternoons/evenings with staying loud  We may not always work in the [place] room due to generalization of louder speech with the environment of [place] room.

## 2020-05-27 DIAGNOSIS — M8589 Other specified disorders of bone density and structure, multiple sites: Secondary | ICD-10-CM | POA: Diagnosis not present

## 2020-05-27 DIAGNOSIS — E785 Hyperlipidemia, unspecified: Secondary | ICD-10-CM | POA: Diagnosis not present

## 2020-05-27 DIAGNOSIS — E559 Vitamin D deficiency, unspecified: Secondary | ICD-10-CM | POA: Diagnosis not present

## 2020-05-27 DIAGNOSIS — W010XXA Fall on same level from slipping, tripping and stumbling without subsequent striking against object, initial encounter: Secondary | ICD-10-CM | POA: Diagnosis not present

## 2020-05-27 DIAGNOSIS — R269 Unspecified abnormalities of gait and mobility: Secondary | ICD-10-CM | POA: Diagnosis not present

## 2020-05-27 DIAGNOSIS — G20 Parkinson's disease: Secondary | ICD-10-CM | POA: Diagnosis not present

## 2020-05-27 DIAGNOSIS — Z125 Encounter for screening for malignant neoplasm of prostate: Secondary | ICD-10-CM | POA: Diagnosis not present

## 2020-05-28 ENCOUNTER — Ambulatory Visit (INDEPENDENT_AMBULATORY_CARE_PROVIDER_SITE_OTHER): Payer: Medicare Other | Admitting: Neurology

## 2020-05-28 ENCOUNTER — Encounter: Payer: Self-pay | Admitting: Neurology

## 2020-05-28 ENCOUNTER — Other Ambulatory Visit: Payer: Self-pay

## 2020-05-28 VITALS — BP 112/82 | HR 92 | Ht 71.0 in | Wt 157.3 lb

## 2020-05-28 DIAGNOSIS — G20 Parkinson's disease: Secondary | ICD-10-CM | POA: Diagnosis not present

## 2020-05-28 DIAGNOSIS — G4752 REM sleep behavior disorder: Secondary | ICD-10-CM | POA: Diagnosis not present

## 2020-05-28 DIAGNOSIS — G249 Dystonia, unspecified: Secondary | ICD-10-CM | POA: Diagnosis not present

## 2020-05-28 DIAGNOSIS — Z9689 Presence of other specified functional implants: Secondary | ICD-10-CM

## 2020-05-28 NOTE — Patient Instructions (Addendum)
I am so sorry for the loss of your brother-in-law.  Your exam is fairly stable, currently no significant facial dyskinesias.  I would like for you to avoid taking half pills of the levodopa in half hour increments, I think it complicates the regimen too much and may not provide any additional benefit for the dyskinesias.  As discussed, please try to take your Sinemet in the following way: Take 1 pill every 2 hours starting at 8 AM for a total of 7 doses:   1 pill at 8 AM, 1 pill at 10 AM, 1 pill at noon, 1 pill at 2 PM, 1 pill at 4 PM, 1 pill at 6 PM, 1 pill at 8 PM.  You can continue taking clonazepam 0.25 mg at bedtime along with melatonin 3 mg, 1 or 2 hours before your bedtime.  Continue with ropinirole long-acting 6 mg strength 1 pill daily, continue with amantadine 100 mg twice daily.  Please continue to try to stay active.  Monitor for constipation issues.  Follow-up routinely in 6 months, sooner if needed.

## 2020-05-28 NOTE — Progress Notes (Signed)
Subjective:    Patient ID: Ronald Noble is a 75 y.o. male.  HPI    Interim history:   Mr. Ronald Noble is a 75 year old right-handed gentleman with an underlying medical history of skin cancer including melanoma, enlarged prostate (s/p TURP), who presents for followup consultation of Ronald R sided predominant Parkinson's disease, complicated by RBD and sleep disturbance as well as mild dyskinesias, with s/p bilateral DBS placements at [facility], s/p L IPG replacement on the L in 09/2018. He is accompanied by Ronald Noble again today. I last saw him on 11/23/2019, at which time he felt fairly stable.  He had one fall as he slipped on wet floor carrying groceries but thankfully did not have any injuries.  He voiced concern about Ronald sister who was having trouble with her Parkinson's disease and we discussed this stressor at the time.  He had discontinued Botox injections at [facility] for Ronald blepharospasm.  He was advised to increase Ronald clonazepam to 0.5 mg at bedtime due to increase in dream enactment behavior.  He had an interim appointment with Ronald Noble on 03/06/2020.  Ronald Noble recently requested to resume Botox injections at [facility].  Today, 05/28/2020: He reports feeling fairly stable, but he is bothered by facial dyskinesias, sometimes he feels that he has a angry, distorted face.  He is not sure when Ronald dyskinesias are more bothersome, probably later in the day.  He has been taking Ronald Sinemet in half pill increments at times taking 1-1/2 pills for Ronald first dose along with Ronald amantadine but then for Ronald 11 AM dose he will take 1/2 pill at 11, another half at 1130, another half at 95.  He will do the same thing around 2 PM taking half a pill every 1/2 hour x3.  Sometimes he does not get all the half pills in.  He has not noticed a telltale improvement in Ronald dyskinesias when he started doing this.  He has not had any falls.  Sadly, they lost Ronald Noble's younger brother suddenly at age 59 recently.   They are still dealing with the loss and does not put the house on the market.  He was single, he lived in the parents home.  Patient's Noble is still dealing with the loss of her only sibling.  They have a good support system.  For Ronald sleep, he has been taking clonazepam 0.25 mg 1 pill each night along with melatonin.  He did not actually end up taking the 2 pills as we have discussed last time on a consistent bed he says.  0.5 mg asShe reports that he sleeps fairly well with a combination of clonazepam and melatonin.     The patient's allergies, current medications, family history, past medical history, past social history, past surgical history and problem list were reviewed and updated as appropriate.    Previously (copied from previous notes for reference):   He had an interim appointment with Dr. Deboraha Noble at [facility] on 09/20/19, next appointment in September and an appointment with Ronald Noble in October.   I saw him on 05/03/2019, at which time he reported a problem with Ronald lower back and right knee.  He had to use a cane for a while after a fall in the summer.  He had physical therapy and also a steroid injection in November 2020.  He was overall stable.  He was advised to follow-up routinely in 6 months and continue with Ronald medications.   He emailed in  the interim with concerns regarding Ronald sisters Parkinson's disease and associated delusions and hallucinations.     I saw him on 11/01/2018, at which time he reported more problems with Ronald balance and difficulty with freezing and also difficulty stopping when needed.  He had interim left IPG replacement on 09/23/2018.  He has been receiving Botox injections for Ronald blepharospasm.  He did report a fall and bumped Ronald right hip.  He had an x-ray which was negative for any bony injuries.  He was advised to monitor Ronald hip pain.  He was advised to continue with Ronald Parkinson's medications.    I saw him on 04/28/2018, at which time he reported  doing well, had no falls, weight has been stable.  I suggested he keep Ronald medications the same.       I saw him on 10/26/2017, at which time he reported doing okay, tried Gocovri for one month, but it was cost prohibitive. He was back on amantadine 100 mg bid. He tried the reduced dose of amantadine but did not do well on the lower dose. He reported no recent falls. Was getting Botox injections for blepharospasm through [name]. He was on Sinemet 1-1/2 pills 5 times a day, long-acting Requip once daily at 5 PM. I asked him to continue with Ronald medication regimen.   I saw him on 04/28/2017, at which time he reported some daytime somnolence. He was supposed to get cataract surgeries and colonoscopy done. He had done well after Botox injections at [facility]. He was on amantadine twice daily and I suggested we reduce it to once daily and consider taking him off altogether. I suggested he take Ronald long-acting Requip 6 mg once daily in the evening, instead of during the day. We kept Ronald Sinemet at 1-1/2 pills 5 times a day.     I saw him on 10/29/2016, at which time he reported doing okay, he had no recent falls, Ronald left eye ptosis improved after adjustment of Ronald DBS. He did not end up having any Botox injections to Ronald eyelid. He was on Sinemet 1-1/2 pills 5 times a day, 3 hourly intervals, starting approximately at 8 AM. He was on amantadine twice daily and Requip XL around 1 PM.    I saw him on 04/09/2016, at which time he was doing okay, he had improvement in Ronald blood first specimen on the left in facial dyskinesias. He had been followed at [facility]. He was started on amantadine and was taking it twice daily. He was off of Stalevo completely. He felt that Ronald abdominal cramping and facial dyskinesias improved after he came off of Stalevo altogether. He was in the process of being scheduled for Botox injections for Ronald blepharospasm at [facility]. He was still on once daily long-acting Requip,  when he tried to stop it completely he noticed a flareup of Ronald RLS. He denied any recent falls. He was exercising regularly. He did have some constipation but generally under control with some regularity in Ronald bowel movements reported.    I saw him on 07/09/2015, at which time he reported no recent syncope. He had a tilt table test in January 2017 which showed abnormal findings but minimal symptoms reported. He saw Dr. Hartford Poli with vascular surgery for consultation on 05/28/2015. He had a 24-hour blood pressure monitor. He had a follow-up appointment pending with Ronald vascular specialist. He also had an appointment pending with [name] in October 2017. He felt he pulled  a muscle in Ronald upper back on the right. He was on Stalevo about 4 times a day, sometimes 5 times a day depending on Ronald day to day activities. He was on long-acting Requip 6 mg once daily. He was sleeping a little better. He had been advised to start using compression stockings but had not started yet. He had noticed worsening of droopy eyelid. He mailed in December 2017 Re: Blepharospasm and treatment for this. He had been seeing an ophthalmologist.   I saw him on 03/11/2015, at which time he reported a recent fall in September 2016. He had just come back from a road trip and after being in the car for about 2-1/2 hours he got out of the car and fell to the ground for Ronald Noble even realize what happened. He fell on pavement and chipped Ronald tooth. They were with some friends who helped. He had to have a crown placed over Ronald chipped tooth. Ronald DBS settings were adjusted when he had Ronald follow-up at [facility]. Ronald orthostatic blood pressure values were stable. Since Ronald settings were changed he had no other syncopal spell. He did have some medication changes as well. He was no longer on clonazepam. Stalevo was 100 mg strength 4-5 times a day. Requip long-acting was 6 mg once daily. Ronald Noble was on long-acting melatonin 6 mg each night.  He was advised to drink 4 bottles of water. He was scheduled for tilt table test as well. Ronald Noble was concerned about him climbing on ladders. We talked about gait safety and syncope risk. He was advised not to climb on any ladders or work at heights.    I saw him on 11/07/2014, at which time he reported doing reasonably well. He had nasal congestion after Ronald fall and injury. Of note, unfortunately, he had a syncopal spell on 10/29/2014 and hit Ronald nose as he fell and sustained a nasal fracture. I reviewed the emergency room records. He was referred to ENT. Ronald laceration was repaired with Dermabond. He reported that he stood up and felt lightheaded and then passed out. He fell and hit Ronald face. He had a CT head without contrast as well as maxillofacial CT without contrast on 10/29/2014 which showed: RIGHT and LEFT nasal bone fractures. Small displaced fracture fragment on the LEFT. No skull fracture or intracranial hemorrhage. Unremarkable appearing deep brain stimulator leads. He fell at work. He had taken Levitra at night and 2 days prior he was working hard in the yard. He is notoriously not drinking enough water he admits. He had taken Ronald Stalevo in the morning and was due for Ronald next dose and became lightheaded when he got up quickly from Ronald desk. He had been sitting for about 45 minutes consistently at Ronald desk. He did have a warning sign and kept walking instead of sitting down again. He had a follow-up appointment at [facility] on 11/02/2014 and Ronald Requip was increased slightly again. He had re-programming of Ronald DBS as well. He had reduced the Requip XL to 3 mg twice daily but this was increased to 4 mg twice daily. Ronald Stalevo was at 4-6 pills daily depending on Ronald work day. He does get sleepy at night around 9:30 and often falls asleep while watching TV. He tried Melatonin long-acting but in combination with clonazepam at night it did not work well and in fact he had insomnia. He tried to  reduce Ronald clonazepam to 0.25 mg each night but increased  it back to 0.5 mg each night. For Ronald nose fracture he saw Dr. Redmond Baseman and was scheduled for surgery. I did not change Ronald medications. I asked him to drink more water and reminded him to change positions slowly.   I saw him on 08/07/2014, at which time he reported doing quite well after Ronald DBS placement. Ronald Noble felt that he was doing great. He was eating well. He was not drinking enough water. He had trace edema in Ronald distal lower extremities. He had 2 appointments for DBS programming. Ronald Stalevo was reduced from 6 pills a day to 4 pills a day but then he increased it back to 5 pills a day. He was off generic Sinemet. He was on Azilect once daily and Requip long-acting 8 mg twice daily.    He's been back to work. He is currently on Azilect once daily, Requip XL 8 mg twice daily, Stalevo 100 mg strength one pill 5 times a day and he has stopped the carbidopa-levodopa. I reviewed Ronald outside records from Dr. Salomon Fick at [facility] neurosurgery. Patient had bilateral STN DBS electrodes placed on 06/01/2014. He had pulse generators placed approximately 3 weeks after that, 06/13/14.   I saw him on 02/01/2014, at which time he reported difficulty staying asleep. He had not consistently tried melatonin. He had problems with urinary urgency and was advised to try Kegel exercises by Ronald urologist. Ronald PCP talked to an infectious diseases specialist and it was determined he did not actually have Lyme disease.    He did not show for an appointment on 01/30/2014 - he genuinely forgot!   I saw him on 12/19/2013, at which time he reported that Ronald medication was not working as well. He reported more freezing episodes and more off time. Of note, he had been using a new protein milkshake that he was drinking first thing in the morning. He had lost some weight and in an effort to improve Ronald weight loss he had started this new  supplements. Ronald primary care physician had referred him to a nutritionist. We also talked about DBS evaluation and he requested to see another specialist for DBS candidacy. I referred him to [name] at [facility] I also advised him to discontinue using the new protein supplement as I felt it may be interfering with the absorption of Ronald Stalevo. I also suggested a trial of Parcopa half a pill 3 times a day at 11, 2 PM and 7 PM and we kept the Stalevo the same. He was advised to use MiraLAX as needed for constipation.   I saw him on 07/31/13, at which time [name], including swimming, yoga, and weights. He has been on Stalevo 100 mg every 2 1/2 hours starting at 7:30 AM for 6 doses. He stopped clonazepam, as he felt some daytime sluggishness. He has been taking C/L 1/2 tid in between. He had no cognitive issues, but did have some "downtime" midday. I did not change Ronald medications. He had an appointment with [name] at [facility] in June, but there was a mix up with Ronald appointment and he does not have an appointment until next year.   In the interim, he was diagnosed with Lyme d/s in July. He was tested and treated by Ronald PCP has has been referred to an infectious d/s doctor. He brings in Ronald latest lying test results from 12/05/2013 which I reviewed: It  looks like Ronald IgM antibody status is negative but Ronald IgG antibody status has not quite turn positive as far as I understand the report. That means he is probably in the subacute or early chronic phase if I interpret this correctly. He requested a sooner than scheduled appointment, due to feeling more freezing and more off time. This starts in the late morning hours but can also go into the afternoon after lunch. Cognitively and mood wise he feels stable. He does not sleep well at night. He has not consistently tried melatonin I recommended.   I saw him on 01/30/2013, at which time I felt he was  stable. We talked about DBS surgery. I did not make any medication changes. He is aware of the increase risk of melanoma associated with DAs and Ronald dermatologist is aware that he is taking Requip XL.   I first met him on 09/27/2012, which time I did not make any changes to Ronald medication and did not order any new test as he was stable. He saw [name] at [facility] and discussed DBS in the past but was not deemed a cadidate for it at the time as he was doing well. He was told by [name] to stay on schedule with Ronald medication. 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 Sleep has been an issue. He goes to sleep well, but wakes up after 45 minutes to 1 hour. He is seeing a urologist. He exercises regularly.   He previously followed with [name] and was last seen by him on 05/25/2012, at which time [name] felt he was stable, but because of off time at 2 hours he increased Ronald carbidopa-levodopa by adding half of a 10-100 mg strength 3 times a day to Ronald regimen. He recommended decreasing Ronald Stalevo to 125 mg from 100 mg 6 times a day. He has been on Stalevo 125 mg 6 times a day, at 7:30, 10, 12:30, 3 PM, 5:30 PM and 8 PM. He is on clonazepam 0.5 mg at night, Requip long-acting 6 mg twice daily, rasagiline 1 mg once daily, carbidopa-levodopa 10-100 mg strength half a tablet 3 times a day, 7:30 to 10, then midday and later in the evening.   He was followed by [name] since 2008 with a history of Parkinson's disease. He has a history of increased tone and slowness as well as tremor with R sided predominance. He was initially placed on Requip XL and then rasagiline was added in October 2008. In February 2010 Stalevo was added. He had low vitamin D levels in 2008. ANA, ESR, ceruloplasmin were normal except for slightly low ceruloplasmin of 16. Urine copper test was negative. Methylmalonic acid was normal. Urine for heavy metals is normal as well. MRI brain with and without contrast on  01/07/2007 was normal. He had a sleep study in October 2008 showing PLMs and evidence of RBD. Repeat sleep study in September 2011 showed supine obstructive sleep apnea and RBD. He had a CPAP titration study in late September 2011 which was not very successful. He has been on clonazepam at night. He has not noticed any compulsive behaviors, memory loss, depression, swelling, or orthostatic dizziness. He recently had a skin biopsy showing atypical cells, concerning for melanoma.   He works full-time as an Forensic psychologist, [employer]. He exercises regularly and does yoga.    Ronald Past Medical History Is Significant For: Past Medical History:  Diagnosis Date  .  BPH (benign prostatic hyperplasia)   . COVID 04/24/2020   scratchy throat x 1 day all symptoms resolved  . Parkinson disease ([place])   . Skin cancer of face    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  . Sleep apnea    mild no cpap used  . Urethral stricture     Ronald Past Surgical History Is Significant For: Past Surgical History:  Procedure Laterality Date  . BURR HOLE W/ STEREOTACTIC INSERTION OF DBS LEADS / INTRAOP MICROELECTRODE RECORDING  04/2014   [facility], [facility] leads [facility] in 2020  . COLONOSCOPY  2019  . CYSTOSCOPY WITH URETHRAL DILATATION N/A 06/15/2019   Procedure: CYSTOSCOPY WITH URETHRAL DILATATION;  Surgeon: Franchot Gallo, MD;  Location: [facility];  Service: Urology;  Laterality: N/A;  30 MINS  . CYSTOSCOPY WITH URETHRAL DILATATION N/A 05/09/2020   Procedure: CYSTOSCOPY WITH BALLOON DILATATION OF POSTERIOR URETHRAL STRICTURE, FLUOROSCOPIC INTERPRETATION;  Surgeon: Franchot Gallo, MD;  Location: [facility];  Service: Urology;  Laterality: N/A;  . KNEE SURGERY  yrs ago   exploratory/left  . SKIN CANCER EXCISION  01/2014   face  . TRANSURETHRAL RESECTION OF PROSTATE     2008    Ronald Family History Is Significant For: Family History  Problem Relation Age of Onset  . COPD  Mother   . Cancer Father        Bladder  . Colon cancer Neg Hx   . Esophageal cancer Neg Hx   . Stomach cancer Neg Hx     Ronald Social History Is Significant For: Social History   Socioeconomic History  . Marital status: Married    Spouse name: Levada Dy  . Number of children: 4  . Years of education: 66  . Highest education level: Not on file  Occupational History  . Occupation: LAWYER    Employer: [employer]  Tobacco Use  . Smoking status: Never Smoker  . Smokeless tobacco: Never Used  Vaping Use  . Vaping Use: Never used  Substance and Sexual Activity  . Alcohol use: Yes    Alcohol/week: 5.0 standard drinks    Types: 3 Glasses of wine, 2 Cans of beer per week    Comment: occasional  . Drug use: No  . Sexual activity: Not on file  Other Topics Concern  . Not on file  Social History Narrative   Patient is right handed and resides with Noble   1-2 cups of coffee a day    Social Determinants of Health   Financial Resource Strain: Not on file  Food Insecurity: Not on file  Transportation Needs: Not on file  Physical Activity: Not on file  Stress: Not on file  Social Connections: Not on file    Ronald Allergies Are:  No Known Allergies:   Ronald Current Medications Are:  Outpatient Encounter Medications as of 05/28/2020  Medication Sig  . amantadine (SYMMETREL) 100 MG capsule 2 (two) times daily.   . carbidopa-levodopa (SINEMET IR) 25-100 MG tablet Take 1.5 tablets by mouth 5 (five) times daily.  . cholecalciferol (VITAMIN D3) 25 MCG (1000 UNIT) tablet Take 2,000 Units by mouth daily.  . clonazePAM (KLONOPIN) 0.25 MG disintegrating tablet Take 2 tablets (0.5 mg total) by mouth at bedtime. (Patient taking differently: Take 0.25 mg by mouth at bedtime.)  . magnesium 30 MG tablet Take 30 mg by mouth daily. Over the counter unsure of mg  . Ropinirole HCl 6 MG TB24 daily. 1 tablet at  5 pm   No facility-administered encounter medications on file as of 05/28/2020.   :  Review of Systems:  Out of a complete 14 point review of systems, all are reviewed and negative with the exception of these symptoms as listed below: Review of Systems  Neurological:       Here for f/u on PD reports overall he has been doing fairly well.     Objective:  Neurological Exam  Physical Exam Physical Examination:   Vitals:   05/28/20 1309  BP: 112/82  Pulse: 92    General Examination: The patient is a very pleasant 75 y.o. male in no acute distress. He appears well-developed and well-nourished and well groomed.   HEENT:Normocephalic, atraumatic, pupils are equal, round and reactive to light, extraocular tracking is mildly impaired, no significant blepharospasm, no significant ptosis.  He does not have any significant involuntary grimacing or dyskinesias currently.  Neck is moderately rigid, mild decrease in passive range of motion, no significant facial tremor.  Airway examination shows stable findings, mild excess moisture. Ronald stimulator wires are tunneled on the left side.   Chest:is clear to auscultation without wheezing, rhonchi or crackles noted.   Heart:sounds are regular and normal without murmurs, rubs or gallops noted.   Abdomen:is soft, non-tender and non-distended with normal bowel sounds appreciated on auscultation.  Extremities:There isno obvious edema in the distal lower extremities bilaterally.  Skin: is warm and dry with no trophic changes noted. Age-related changes are noted on the skin and scars from skin cancer removals.   Musculoskeletal: exam reveals no obvious joint deformities, tenderness, joint swelling or erythema, with the exception of low back pain reported and right shoulder pain.Upper body tilt to R seems to be fairly stable.  Neurologically:  Mental status: The patient is awake and alert, paying good attention. He is able to completely provide the history. He is oriented to: person, place, time/date, situation, day of  week, month of year and year. Ronald memory, attention, language and knowledge areappropriate.There is no aphasia, agnosia, apraxia or anomia. There is amilddegree of bradyphrenia. Speech is mild to moderately hypophonic withminimal dysarthria noted. Mood is congruent and affect is normal.   Cranial nerves are as described above under HEENT exam. Motor exam: Normal bulk, and strength for age is noted. There are no dyskinesias noted today.No resting tremor. Tone ismild to moderatelyrigid with presence of cogwheeling in the right upper extremity. There is overall mild to moderate bradykinesia. There is no drift or rebound.   Fine motor skills exam: Fine motor skills are mild to moderately impaired, more pronounced on the R than L,butstable.  Cerebellar testing shows no dysmetria or intention tremor. There is no truncal or gait ataxia.  Sensory exam is intact to light touch throughout.  Gait, station and balance: He stands up from the seated position with mild difficulty and does fall back into the chair once.  He has to push himself up with both hands.  He does not require assistance.  He isnoted to lean to the rightmore andpostureismoderatelystooped. He walks withdecreased arm swing bilaterally.Mild dystonic posturing of the right arm.Balance is mildly impaired.   Assessment and Plan:   In summary, Tank Difiore is a very pleasant80 year old malewith an underlying medical history of skin cancer including melanoma, enlarged prostate (s/p TURP), who presents for followup consultation of Ronald R sided predominant Parkinson's disease, complicated by RBD and sleep disturbance as well as mild dyskinesias. He iss/p bilateral DBS placements at [facility], s/p L IPG  replacement on the L in 09/2018. He isOn Sinemet, Requip long-acting and amantadine. He has hadorthostatic hypotension, syncopal spells, fallswith injuries in the past, constipation and bladder hyperactivity,and nows/p b/l  DBS placement at [facility] on 06/01/14 and generator placement on 06/13/2014.Left-sided battery replacement/generator replacement in June 2020.Overall, he has done well over the course of years but he has had interim issues with low back pain and arthritis of the shoulder on the right.  He has had right knee pain.  He has had syncopal spells in the past including falls with injuries.  We had talked about melanoma and taking Parkinson's medicine concomitantly at length before.  He was agreeable to monitoring for skin cancer with Ronald dermatologist. He sustained Fx of nose when he fell in Ronald office, and then also fell in Ronald driveway and chipped a tooth, needed a crown placed. He hadto have nose surgery. He saw a vascular specialist and had a tilt table test at [facility]. He had a24-hour blood pressure monitor test. Hewas started on amantadine for dyskinesias.He had a one month trial of Gocovri, but a Rx was cost prohibitiveand hewentback to amantadine twice a day. He tried to reduce it to once daily but found it not as effective.He wasgetting regularBotox injection for blepharospasmat [facility], but stopped.  He does not have any significant evidence of blepharospasm at this time.  He used to be on Stalevo in the past but this was stopped in 2018.  He has had intermittent facial dyskinesias, not apparent at this time.  He has been taking Ronald levodopa and half pill increments at times in the middle of the day but he is discouraged from doing so as it has not added much in the way of benefit for Ronald facial dyskinesias and unnecessarily complicates Ronald regimen.  He is advised to continue with ropinirole long-acting once daily in the evening, amantadine 100 mg twice daily, clonazepam 0.25 mg in the evening and melatonin 3 mg before bedtime.  As far as Ronald levodopa, he is advised to take 1 pill every 2 hours for total of 7 doses rather than 1-1/2 pills 5 times a day and he is discouraged from taking half pill doses  on a half hourly schedule.   He was given detailed written instructions.  He is advised to follow-up in this clinic routinely in 6 months, sooner if needed. He is advised to keep Ronald appointments as scheduled or planned at [facility]. I answered all their questions today and the patient and Ronald Noble were in agreement. I spent 30 minutes in total face-to-face time and in reviewing records during pre-charting, more than 50% of which was spent in counseling and coordination of care, reviewing test results, reviewing medications and treatment regimen and/or in discussing or reviewing the diagnosis of PD, the prognosis and treatment options. Pertinent laboratory and imaging test results that were available during this visit with the patient were reviewed by me and considered in my medical decision making (see chart for details).

## 2020-05-30 DIAGNOSIS — N35011 Post-traumatic bulbous urethral stricture: Secondary | ICD-10-CM | POA: Diagnosis not present

## 2020-06-03 DIAGNOSIS — Z1212 Encounter for screening for malignant neoplasm of rectum: Secondary | ICD-10-CM | POA: Diagnosis not present

## 2020-06-03 DIAGNOSIS — R82998 Other abnormal findings in urine: Secondary | ICD-10-CM | POA: Diagnosis not present

## 2020-06-04 DIAGNOSIS — E785 Hyperlipidemia, unspecified: Secondary | ICD-10-CM | POA: Diagnosis not present

## 2020-06-04 DIAGNOSIS — Z23 Encounter for immunization: Secondary | ICD-10-CM | POA: Diagnosis not present

## 2020-06-04 DIAGNOSIS — M8589 Other specified disorders of bone density and structure, multiple sites: Secondary | ICD-10-CM | POA: Diagnosis not present

## 2020-06-04 DIAGNOSIS — N401 Enlarged prostate with lower urinary tract symptoms: Secondary | ICD-10-CM | POA: Diagnosis not present

## 2020-06-04 DIAGNOSIS — Z1331 Encounter for screening for depression: Secondary | ICD-10-CM | POA: Diagnosis not present

## 2020-06-04 DIAGNOSIS — M545 Low back pain, unspecified: Secondary | ICD-10-CM | POA: Diagnosis not present

## 2020-06-04 DIAGNOSIS — R269 Unspecified abnormalities of gait and mobility: Secondary | ICD-10-CM | POA: Diagnosis not present

## 2020-06-04 DIAGNOSIS — Z0000 Encounter for general adult medical examination without abnormal findings: Secondary | ICD-10-CM | POA: Diagnosis not present

## 2020-06-04 DIAGNOSIS — M546 Pain in thoracic spine: Secondary | ICD-10-CM | POA: Diagnosis not present

## 2020-06-04 DIAGNOSIS — G4733 Obstructive sleep apnea (adult) (pediatric): Secondary | ICD-10-CM | POA: Diagnosis not present

## 2020-06-04 DIAGNOSIS — G20 Parkinson's disease: Secondary | ICD-10-CM | POA: Diagnosis not present

## 2020-06-26 ENCOUNTER — Other Ambulatory Visit: Payer: Self-pay

## 2020-06-26 ENCOUNTER — Ambulatory Visit: Payer: Medicare Other | Attending: Orthopaedic Surgery

## 2020-06-26 DIAGNOSIS — R41841 Cognitive communication deficit: Secondary | ICD-10-CM | POA: Diagnosis not present

## 2020-06-26 DIAGNOSIS — R471 Dysarthria and anarthria: Secondary | ICD-10-CM | POA: Diagnosis not present

## 2020-06-26 NOTE — Therapy (Signed)
[facility] [address] [place], [name], [address] Phone: [phone]   Fax:  [phone]  Speech Language Pathology Treatment  Patient Details  Name: Ronald Noble MRN: [id number] Date of Birth: 1945/07/20 Referring Provider (SLP): Star Age, MD   Encounter Date: 06/26/2020   End of Session - 06/26/20 1224    Visit Number 2    Number of Visits 17    Date for SLP Re-Evaluation 08/15/20    SLP Start Time 1018    SLP Stop Time  1100    SLP Time Calculation (min) 42 min    Activity Tolerance Patient tolerated treatment well           Past Medical History:  Diagnosis Date  . BPH (benign prostatic hyperplasia)   . COVID 04/24/2020   scratchy throat x 1 day all symptoms resolved  . Parkinson disease ([place])   . Skin cancer of face    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  . Sleep apnea    mild no cpap used  . Urethral stricture     Past Surgical History:  Procedure Laterality Date  . BURR HOLE W/ STEREOTACTIC INSERTION OF DBS LEADS / INTRAOP MICROELECTRODE RECORDING  04/2014   [facility] in [facility]  . COLONOSCOPY  2019  . CYSTOSCOPY WITH URETHRAL DILATATION N/A 06/15/2019   Procedure: CYSTOSCOPY WITH URETHRAL DILATATION;  Surgeon: Franchot Gallo, MD;  Location: [facility];  Service: Urology;  Laterality: N/A;  30 MINS  . CYSTOSCOPY WITH URETHRAL DILATATION N/A 05/09/2020   Procedure: CYSTOSCOPY WITH BALLOON DILATATION OF POSTERIOR URETHRAL STRICTURE, FLUOROSCOPIC INTERPRETATION;  Surgeon: Franchot Gallo, MD;  [facility];  Service: Urology;  Laterality: N/A;  . KNEE SURGERY  yrs ago   exploratory/left  . SKIN CANCER EXCISION  01/2014   face  . TRANSURETHRAL RESECTION OF PROSTATE     2008    There were no vitals filed for this visit.   Subjective Assessment - 06/26/20 1026    Subjective "One problem I have is that my body  fatigue affects my talking."    Currently in Pain? No/denies                 ADULT SLP TREATMENT - 06/26/20 1027      General Information   Behavior/Cognition Alert;Cooperative;Pleasant mood      Treatment Provided   Treatment provided Cognitive-Linquistic      Cognitive-Linquistic Treatment   Treatment focused on Dysarthria    Skilled Treatment Given pt's "s" statement, SLP educated pt about energy conservation. Pt acknowledged understanding and has endorsed having more difficulty with speech intelligibilty in the evenings.SLP encouraged pt to think about energy conservation during the day and pt reminded SLP that exercise was important - SLP clarified for "extra" exercise, or doing LESS exercise on days when pt knows he will have an important conversation with his wife after work. It took pt 3 reps of /a/ to get mid 80s dB routinely with min A for loudness - told SLP to do 7-8 reps of /a/ if this is the case, or to think about increasing the loudness and effort prior to doing loud /a/ so to his his most effortful and stronger speech. "I have to be careful when using the speakerphone - people will often tell me that we have a bad connection. I know then I ahve to either shout loudly or put on my headpiece." SLP asked pt  if he watned to make a goal for using speakerphone.      Assessment / Recommendations / Plan   Plan Continue with current plan of care   goal for speakerphone added     Progression Toward Goals   Progression toward goals Progressing toward goals            SLP Education - 06/26/20 1223    Education Details loud /a/ procedure    Person(s) Educated Patient    Methods Explanation;Demonstration;Verbal cues    Comprehension Verbalized understanding;Returned demonstration;Verbal cues required;Need further instruction            SLP Short Term Goals - 06/26/20 1224      SLP SHORT TERM GOAL #1   Title Pt will  maintain average low-mid 90sdB with loud /a/ for 4  sessions with rare min A for loudness    Time 4    Period Weeks    Status On-going      SLP SHORT TERM GOAL #2   Title Pt will average low-mid 70sdB on structured speech tasks with rare min A for three sessions    Time 4    Period Weeks    Status On-going      SLP SHORT TERM GOAL #3   Title Pt will maintain average low 70s dB over 12 minute simple conversation with rare min A, for 2 sessions    Period Weeks    Status On-going      SLP SHORT TERM GOAL #4   Title pt will maintain speech intelligibility at 95%+ using intelligibility compensations(due to rushed speech) for 8 minutes simple conversation in three sesssions    Time 1    Period Weeks    Status On-going            SLP Long Term Goals - 06/26/20 1052      SLP LONG TERM GOAL #1   Title Pt will maintain low 70sdB over 10 minute mod complex conversation with rare min A for loudness in three therapy sessions    Time 8    Period Weeks   or 17 sessions   Status On-going      SLP LONG TERM GOAL #2   Title Pt will achieve 95%+ speech intelligibility outside of tx room (due to rushing speech) over 12 minutes mod complex conversation with modified independence (external cues) in three sessions    Time 8    Period Weeks    Status On-going      SLP LONG TERM GOAL #3   Title pt will maintain average low-mod 90s dB with loud /a/ in 7 total sessions    Time 8    Status On-going      SLP LONG TERM GOAL #4   Title using speakerphone pt will demo articulation in a 5 minute conversation adequate for no more than 1 listener request for pt repeat    Time 8    Period Weeks    Status New            Plan - 06/26/20 1224    Clinical Impression Statement Ronald Noble presents today with [facility] speech loudness over 40 minute evaluation however reports that speech volume declines in late afternoon so that his wife has needed to ask him more frequently for repeats of his spoken messages. [name] agrees that his speech seems softer  than in the past and endorses that in the afternoons he "just (wants) to relax" due to fatigue and this  plays a negative role in maintaining WNL speech loudness at home. Rarely today, (x2) pt exhibited "rushes" of speech where he combined 3-4 words in an utterance into one less-intelligible word. He noted that he needs to "shout slowly" in order to improve intelligibility.    Speech Therapy Frequency 2x / week    Duration 8 weeks   or 17 total sessions   Treatment/Interventions Compensatory techniques;Functional tasks;Cueing hierarchy;SLP instruction and feedback;Patient/family education;Internal/external aids    Potential to Achieve Goals Good    Potential Considerations Cooperation/participation level   pt has, historically, chosen to be more independent rather than always heeding SLP suggestions          Patient will benefit from skilled therapeutic intervention in order to improve the following deficits and impairments:   Dysarthria and anarthria  Cognitive communication deficit    Problem List Patient Active Problem List   Diagnosis Date Noted  . Bilateral olecranon bursitis 07/05/2019  . Other bursitis of knee, left knee 12/06/2018  . Pain in right leg 12/06/2018  . Parkinson disease ([place])     [place] ,[place], [place]  06/26/2020, 12:25 PM  [facility] [address] [place], [name], [address] Phone: [phone]   Fax:  [phone]   Name: Ronald Noble MRN: [id number] Date of Birth: Dec 20, 1945

## 2020-06-28 ENCOUNTER — Ambulatory Visit: Payer: Medicare Other

## 2020-06-28 ENCOUNTER — Other Ambulatory Visit: Payer: Self-pay

## 2020-06-28 DIAGNOSIS — R41841 Cognitive communication deficit: Secondary | ICD-10-CM | POA: Diagnosis not present

## 2020-06-28 DIAGNOSIS — R471 Dysarthria and anarthria: Secondary | ICD-10-CM

## 2020-06-28 NOTE — Therapy (Signed)
[facility] [address] [place], [name], [address] Phone: [phone]   Fax:  [phone]  Speech Language Pathology Treatment  Patient Details  Name: Ronald Noble MRN: [id number] Date of Birth: April 05, 1946 Referring Provider (SLP): Star Age, MD   Encounter Date: 06/28/2020   End of Session - 06/28/20 1254    Visit Number 3    Number of Visits 17    Date for SLP Re-Evaluation 08/15/20    SLP Start Time 1232    SLP Stop Time  6761    SLP Time Calculation (min) 43 min    Activity Tolerance Patient tolerated treatment well           Past Medical History:  Diagnosis Date  . BPH (benign prostatic hyperplasia)   . COVID 04/24/2020   scratchy throat x 1 day all symptoms resolved  . Parkinson disease ([place])   . Skin cancer of face    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  . Sleep apnea    mild no cpap used  . Urethral stricture     Past Surgical History:  Procedure Laterality Date  . BURR HOLE W/ STEREOTACTIC INSERTION OF DBS LEADS / INTRAOP MICROELECTRODE RECORDING  04/2014   [facility] in [facility]  . COLONOSCOPY  2019  . CYSTOSCOPY WITH URETHRAL DILATATION N/A 06/15/2019   Procedure: CYSTOSCOPY WITH URETHRAL DILATATION;  Surgeon: Franchot Gallo, MD;  [facility];  Service: Urology;  Laterality: N/A;  30 MINS  . CYSTOSCOPY WITH URETHRAL DILATATION N/A 05/09/2020   Procedure: CYSTOSCOPY WITH BALLOON DILATATION OF POSTERIOR URETHRAL STRICTURE, FLUOROSCOPIC INTERPRETATION;  Surgeon: Franchot Gallo, MD;  Location: [facility];  Service: Urology;  Laterality: N/A;  . KNEE SURGERY  yrs ago   exploratory/left  . SKIN CANCER EXCISION  01/2014   face  . TRANSURETHRAL RESECTION OF PROSTATE     2008    There were no vitals filed for this visit.   Subjective Assessment - 06/28/20 1234    Subjective "Im tired"    Currently in Pain?  No/denies                 ADULT SLP TREATMENT - 06/28/20 1235      General Information   Behavior/Cognition Alert;Cooperative;Pleasant mood      Treatment Provided   Treatment provided Cognitive-Linquistic      Cognitive-Linquistic Treatment   Treatment focused on Dysarthria;Patient/family/caregiver education    Skilled Treatment SLP reviewed HEP, with inconsistent carryover reported. SLP emphasized importance of completing HEP BID as recommended to maximize results and increase carryover of compensations. SLP targeted use of abdominal breathing, slow rate, and appropriate loudness with rare min A required for sentence level. Pt able to self-correct with increased accuracy demonstrating improving awareness. SLP engaged pt in short conversation for ~2-3 minutes, in which gradual volume decay observed from mid/low 70s to upper/mid 60s. Increased cues required to increase volume, slow rate, and stress each word/sound for improved intelligibility in conversation.      Assessment / Recommendations / Plan   Plan Continue with current plan of care      Progression Toward Goals   Progression toward goals Progressing toward goals            SLP Education - 06/28/20 1255    Education Details HEP, compensations    Person(s) Educated Patient    Methods Explanation;Demonstration    Comprehension Verbalized understanding;Returned demonstration;Need further instruction  SLP Short Term Goals - 06/28/20 1232      SLP SHORT TERM GOAL #1   Title Pt will  maintain average low-mid 90sdB with loud /a/ for 4 sessions with rare min A for loudness    Baseline 06-28-20    Time 4    Period Weeks    Status On-going      SLP SHORT TERM GOAL #2   Title Pt will average low-mid 70sdB on structured speech tasks with rare min A for three sessions    Time 4    Period Weeks    Status On-going      SLP SHORT TERM GOAL #3   Title Pt will maintain average low 70s dB over 12 minute simple  conversation with rare min A, for 2 sessions    Time 4    Period Weeks    Status On-going      SLP SHORT TERM GOAL #4   Title pt will maintain speech intelligibility at 95%+ using intelligibility compensations(due to rushed speech) for 8 minutes simple conversation in three sesssions    Time 4    Period Weeks    Status On-going            SLP Long Term Goals - 06/28/20 1232      SLP LONG TERM GOAL #1   Title Pt will maintain low 70sdB over 10 minute mod complex conversation with rare min A for loudness in three therapy sessions    Time 8    Period Weeks   or 17 sessions   Status On-going      SLP LONG TERM GOAL #2   Title Pt will achieve 95%+ speech intelligibility outside of tx room (due to rushing speech) over 12 minutes mod complex conversation with modified independence (external cues) in three sessions    Time 8    Period Weeks    Status On-going      SLP LONG TERM GOAL #3   Title pt will maintain average low-mod 90s dB with loud /a/ in 7 total sessions    Time 8    Status On-going      SLP LONG TERM GOAL #4   Title using speakerphone pt will demo articulation in a 5 minute conversation adequate for no more than 1 listener request for pt repeat    Time 8    Period Weeks    Status On-going            Plan - 06/28/20 1319    Clinical Impression Statement Blong "Ronald" Alvester Noble presents today with mild dysarthria c/b fluctuations of loudness in conversation and occasional imprecise articulation. Pt rarely exhibited "rushes" of speech where he combined 2-3 words in an utterance into one less-intelligible word this session. SLP reviewed HEP with emphasis to complete BID for increased carryover. Occasional min A required to slow rate, increase effort for volume, and emphasize each word at sentence and conversational levels. SLP recommends continuation of skilled ST to maximize communication effectiveness and improve QOL.    Speech Therapy Frequency 2x / week    Duration 8  weeks   or 17 total visits   Treatment/Interventions Compensatory techniques;Functional tasks;Cueing hierarchy;SLP instruction and feedback;Patient/family education;Internal/external aids    Potential to Achieve Goals Good    Potential Considerations Cooperation/participation level   pt has, historically, chosen to be more independent rather than always heeding SLP suggestions   SLP Home Exercise Plan provided    Consulted and Agree with Plan of Care Patient  Patient will benefit from skilled therapeutic intervention in order to improve the following deficits and impairments:   Dysarthria and anarthria  Cognitive communication deficit    Problem List Patient Active Problem List   Diagnosis Date Noted  . Bilateral olecranon bursitis 07/05/2019  . Other bursitis of knee, left knee 12/06/2018  . Pain in right leg 12/06/2018  . Parkinson disease ([place])     Alinda Deem, MA CCC-SLP 06/28/2020, 1:30 PM  [facility] [address] [place], [name], [address] Phone: [phone]   Fax:  [phone]   Name: Chasen Mendell MRN: [id number] Date of Birth: 09-May-1945

## 2020-07-01 ENCOUNTER — Other Ambulatory Visit: Payer: Self-pay

## 2020-07-01 ENCOUNTER — Ambulatory Visit: Payer: Medicare Other

## 2020-07-01 DIAGNOSIS — R41841 Cognitive communication deficit: Secondary | ICD-10-CM

## 2020-07-01 DIAGNOSIS — R471 Dysarthria and anarthria: Secondary | ICD-10-CM | POA: Diagnosis not present

## 2020-07-01 NOTE — Patient Instructions (Signed)

## 2020-07-01 NOTE — Therapy (Signed)
[facility] [address] [place], [name], [address] Phone: [phone]   Fax:  [phone]  Speech Language Pathology Treatment  Patient Details  Name: Ronald Noble MRN: [id number] Date of Birth: [birth year] Referring Provider (SLP): Star Age, MD   Encounter Date: 07/01/2020   End of Session - 07/01/20 1130    Visit Number 4    Number of Visits 17    Date for SLP Re-Evaluation 08/15/20    SLP Start Time 1100    SLP Stop Time  1145    SLP Time Calculation (min) 45 min    Activity Tolerance Patient tolerated treatment well           Past Medical History:  Diagnosis Date  . BPH (benign prostatic hyperplasia)   . COVID 04/24/2020   scratchy throat x 1 day all symptoms resolved  . Parkinson disease ([place])   . Skin cancer of face    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  . Sleep apnea    mild no cpap used  . Urethral stricture     Past Surgical History:  Procedure Laterality Date  . BURR HOLE W/ STEREOTACTIC INSERTION OF DBS LEADS / INTRAOP MICROELECTRODE RECORDING  04/2014   [facility] in [facility]  . COLONOSCOPY  2019  . CYSTOSCOPY WITH URETHRAL DILATATION N/A 06/15/2019   Procedure: CYSTOSCOPY WITH URETHRAL DILATATION;  Surgeon: Franchot Gallo, MD;  Location: [facility];  Service: Urology;  Laterality: N/A;  30 MINS  . CYSTOSCOPY WITH URETHRAL DILATATION N/A 05/09/2020   Procedure: CYSTOSCOPY WITH BALLOON DILATATION OF POSTERIOR URETHRAL STRICTURE, FLUOROSCOPIC INTERPRETATION;  Surgeon: Franchot Gallo, MD;  Location: [facility];  Service: Urology;  Laterality: N/A;  . KNEE SURGERY  yrs ago   exploratory/left  . SKIN CANCER EXCISION  01/2014   face  . TRANSURETHRAL RESECTION OF PROSTATE     2008    There were no vitals filed for this visit.   Subjective Assessment - 07/01/20 1101    Subjective "I root for [place]"    Currently in  Pain? No/denies                 ADULT SLP TREATMENT - 07/01/20 1102      General Information   Behavior/Cognition Alert;Cooperative;Pleasant mood      Treatment Provided   Treatment provided Cognitive-Linquistic      Cognitive-Linquistic Treatment   Treatment focused on Dysarthria;Patient/family/caregiver education    Skilled Treatment Pt reports he completed minimal HEP this weekend, although visual reminder for "ahh" was effective for completion in car. SLP targeted loud /a/ with frequent strain exhibited. SLP instructed gentle onset and AB, which did improved quality with loudness averaging high 80s db. SLP educated patient on throat clear alternatives and importance of hydration for good vocal hygiene due to "scratchy" feeling reported. Pt reports he completes exercises with "huh" after every word as instructed in book he is reading. SLP provided education re: completing recommended HEP instead. SLP targeted 6-8 words sentences, with pt self-correcting rushed sentences that occurred intermittently during task. Pt ID'd "I rushed through it."Loudness averaged mid 70s db for sentences. Min A required to coordinate slow speech and abdominal breathing. Usual min A required to relax shoulders and reduce tension. In short structured conversation, mod fading to min A required to increase volume and utilize adbdominal breathing. Loudness ranged from mid 60s to low 70s db in conversation, which improved with intermittent cues.  Assessment / Recommendations / Plan   Plan Continue with current plan of care      Progression Toward Goals   Progression toward goals Progressing toward goals            SLP Education - 07/01/20 1119    Education Details throat clear alternatives, hydration    Person(s) Educated Patient    Methods Explanation;Demonstration;Handout    Comprehension Verbalized understanding;Returned demonstration;Need further instruction            SLP Short Term Goals  - 07/01/20 1106      SLP SHORT TERM GOAL #1   Title Pt will  maintain average low-mid 90sdB with loud /a/ for 4 sessions with rare min A for loudness    Baseline 06-28-20    Time 3    Period Weeks    Status On-going      SLP SHORT TERM GOAL #2   Title Pt will average low-mid 70sdB on structured speech tasks with rare min A for three sessions    Baseline 07-01-20    Time 3    Period Weeks    Status On-going      SLP SHORT TERM GOAL #3   Title Pt will maintain average low 70s dB over 12 minute simple conversation with rare min A, for 2 sessions    Time 3    Period Weeks    Status On-going      SLP SHORT TERM GOAL #4   Title pt will maintain speech intelligibility at 95%+ using intelligibility compensations(due to rushed speech) for 8 minutes simple conversation in three sesssions    Time 3    Period Weeks    Status On-going            SLP Long Term Goals - 07/01/20 1106      SLP LONG TERM GOAL #1   Title Pt will maintain low 70sdB over 10 minute mod complex conversation with rare min A for loudness in three therapy sessions    Time 7    Period Weeks   or 17 sessions   Status On-going      SLP LONG TERM GOAL #2   Title Pt will achieve 95%+ speech intelligibility outside of tx room (due to rushing speech) over 12 minutes mod complex conversation with modified independence (external cues) in three sessions    Time 7    Period Weeks    Status On-going      SLP LONG TERM GOAL #3   Title pt will maintain average low-mod 90s dB with loud /a/ in 7 total sessions    Time 7    Period Weeks    Status On-going      SLP LONG TERM GOAL #4   Title using speakerphone pt will demo articulation in a 5 minute conversation adequate for no more than 1 listener request for pt repeat    Time 7    Period Weeks    Status On-going            Plan - 07/01/20 1331    Clinical Impression Statement Ronald Noble presents today with mild dysarthria c/b fluctuations of loudness in  conversation and occasional imprecise articulation. Pt occasionally exhibited "rushes" of speech during structured sentence tasks. Rare increasing to occasional min A required to slow rate, increase effort for volume, and emphasize each word from setence level up to conversational level. Usual cues required to reduce tension in shoulders and promote abdominal breathing. SLP recommends continuation  of skilled ST to maximize communication effectiveness and improve QOL.    Speech Therapy Frequency 2x / week    Duration 8 weeks   or 17 visits   Treatment/Interventions Compensatory techniques;Functional tasks;Cueing hierarchy;SLP instruction and feedback;Patient/family education;Internal/external aids    Potential to Achieve Goals Good    Potential Considerations Cooperation/participation level;Ability to learn/carryover information    SLP Home Exercise Plan provided    Consulted and Agree with Plan of Care Patient           Patient will benefit from skilled therapeutic intervention in order to improve the following deficits and impairments:   Dysarthria and anarthria  Cognitive communication deficit    Problem List Patient Active Problem List   Diagnosis Date Noted  . Bilateral olecranon bursitis 07/05/2019  . Other bursitis of knee, left knee 12/06/2018  . Pain in right leg 12/06/2018  . Parkinson disease ([place])     Alinda Deem, MA CCC-SLP 07/01/2020, 1:36 PM  [facility] [address] [place], [name], [address] Phone: [phone]   Fax:  [phone]   Name: Ronald Noble MRN: [id number] Date of Birth: [birth year]

## 2020-07-04 ENCOUNTER — Ambulatory Visit: Payer: Medicare Other

## 2020-07-04 ENCOUNTER — Other Ambulatory Visit: Payer: Self-pay

## 2020-07-04 DIAGNOSIS — R471 Dysarthria and anarthria: Secondary | ICD-10-CM

## 2020-07-04 DIAGNOSIS — R41841 Cognitive communication deficit: Secondary | ICD-10-CM

## 2020-07-04 NOTE — Therapy (Signed)
[facility] [address] [place], [name], [address] Phone: [phone]   Fax:  [phone]  Speech Language Pathology Treatment  Patient Details  Name: Ronald Noble MRN: [id number] Date of Birth: Feb 14, 1946 Referring Provider (SLP): Star Age, MD   Encounter Date: 07/04/2020   End of Session - 07/04/20 1507    Visit Number 5    Number of Visits 17    Date for SLP Re-Evaluation 08/15/20    SLP Start Time 1320    SLP Stop Time  1400    SLP Time Calculation (min) 40 min    Activity Tolerance Patient tolerated treatment well           Past Medical History:  Diagnosis Date  . BPH (benign prostatic hyperplasia)   . COVID 04/24/2020   scratchy throat x 1 day all symptoms resolved  . Parkinson disease ([place])   . Skin cancer of face    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  . Sleep apnea    mild no cpap used  . Urethral stricture     Past Surgical History:  Procedure Laterality Date  . BURR HOLE W/ STEREOTACTIC INSERTION OF DBS LEADS / INTRAOP MICROELECTRODE RECORDING  04/2014   [facility], [facility] in 2020  . COLONOSCOPY  2019  . CYSTOSCOPY WITH URETHRAL DILATATION N/A 06/15/2019   Procedure: CYSTOSCOPY WITH URETHRAL DILATATION;  Surgeon: Franchot Gallo, MD;  Location: [facility];  Service: Urology;  Laterality: N/A;  30 MINS  . CYSTOSCOPY WITH URETHRAL DILATATION N/A 05/09/2020   Procedure: CYSTOSCOPY WITH BALLOON DILATATION OF POSTERIOR URETHRAL STRICTURE, FLUOROSCOPIC INTERPRETATION;  Surgeon: Franchot Gallo, MD;  Location: [facility];  Service: Urology;  Laterality: N/A;  . KNEE SURGERY  yrs ago   exploratory/left  . SKIN CANCER EXCISION  01/2014   face  . TRANSURETHRAL RESECTION OF PROSTATE     2008    There were no vitals filed for this visit.   Subjective Assessment - 07/04/20 1325    Subjective Pt's last PD meds 60-75 minutes ago.     Currently in Pain? No/denies                 ADULT SLP TREATMENT - 07/04/20 1326      General Information   Behavior/Cognition Alert;Cooperative;Pleasant mood      Treatment Provided   Treatment provided Cognitive-Linquistic      Cognitive-Linquistic Treatment   Treatment focused on Dysarthria;Patient/family/caregiver education    Skilled Treatment SLP targeted loud /a/ with gentle onset and AB, which did improved quality with loudness averaging upper 80s-low 90s db. Pt reports today he's doing better with hydration. Minimal rushed speech in first 25-30 minutes of session today (conversation and structured tasks). Multiple (2-3) sentences responses with WNL loudness and that occurred intermittently during task. Pt ID'd "I rushed through it."Loudness averaged mid 70s db for sentences. Min A faded to standby A required to coordinate abdominal breathing and loudness in 6 minutes mod complex conversation.      Assessment / Recommendations / Plan   Plan Continue with current plan of care      Progression Toward Goals   Progression toward goals Progressing toward goals              SLP Short Term Goals - 07/04/20 1507      SLP SHORT TERM GOAL #1   Title Pt will  maintain average low-mid 90sdB with loud /a/ for 4  sessions with rare min A for loudness    Baseline 06-28-20    Time 3    Period Weeks    Status On-going      SLP SHORT TERM GOAL #2   Title Pt will average low-mid 70sdB on structured speech tasks with rare min A for three sessions    Baseline 07-01-20, 07-04-20    Time 3    Period Weeks    Status On-going      SLP SHORT TERM GOAL #3   Title Pt will maintain average low 70s dB over 12 minute simple conversation with rare min A, for 2 sessions    Baseline 07-04-20    Time 3    Period Weeks    Status On-going      SLP SHORT TERM GOAL #4   Title pt will maintain speech intelligibility at 95%+ using intelligibility compensations(due to rushed speech) for 8  minutes simple conversation in three sesssions    Baseline 07-04-20    Time 3    Period Weeks    Status On-going            SLP Long Term Goals - 07/04/20 1508      SLP LONG TERM GOAL #1   Title Pt will maintain low 70sdB over 10 minute mod complex conversation with rare min A for loudness in three therapy sessions    Time 7    Period Weeks   or 17 sessions   Status On-going      SLP LONG TERM GOAL #2   Title Pt will achieve 95%+ speech intelligibility outside of tx room (due to rushing speech) over 12 minutes mod complex conversation with modified independence (external cues) in three sessions    Time 7    Period Weeks    Status On-going      SLP LONG TERM GOAL #3   Title pt will maintain average low-mod 90s dB with loud /a/ in 7 total sessions    Time 7    Period Weeks    Status On-going      SLP LONG TERM GOAL #4   Title using speakerphone pt will demo articulation in a 5 minute conversation adequate for no more than 1 listener request for pt repeat    Time 7    Period Weeks    Status On-going            Plan - 07/04/20 1507    Clinical Impression Statement Mayank "[name]" Alvester Chou presents today with mild dysarthria c/b fluctuations of loudness in conversation and occasional imprecise articulation. Pt occasionally exhibited "rushes" of speech during structured sentence tasks. Rare increasing to occasional min A required to slow rate, increase effort for volume, and emphasize each word from setence level up to conversational level. Usual cues required to reduce tension in shoulders and promote abdominal breathing. SLP recommends continuation of skilled ST to maximize communication effectiveness and improve QOL.    Speech Therapy Frequency 2x / week    Duration 8 weeks   or 17 visits   Treatment/Interventions Compensatory techniques;Functional tasks;Cueing hierarchy;SLP instruction and feedback;Patient/family education;Internal/external aids    Potential to Achieve Goals Good     Potential Considerations Cooperation/participation level;Ability to learn/carryover information    SLP Home Exercise Plan provided    Consulted and Agree with Plan of Care Patient           Patient will benefit from skilled therapeutic intervention in order to improve the following deficits and impairments:  Dysarthria and anarthria  Cognitive communication deficit    Problem List Patient Active Problem List   Diagnosis Date Noted  . Bilateral olecranon bursitis 07/05/2019  . Other bursitis of knee, left knee 12/06/2018  . Pain in right leg 12/06/2018  . Parkinson disease ([place])     Phillips ,[place], Cyril  07/04/2020, 3:09 PM  [facility] [address] [place], [name], [address] Phone: [phone]   Fax:  [phone]   Name: Nashua Homewood MRN: [id number] Date of Birth: 12/27/1945

## 2020-07-08 ENCOUNTER — Other Ambulatory Visit: Payer: Self-pay

## 2020-07-08 ENCOUNTER — Ambulatory Visit: Payer: Medicare Other | Attending: Orthopaedic Surgery

## 2020-07-08 ENCOUNTER — Ambulatory Visit: Payer: Medicare Other

## 2020-07-08 DIAGNOSIS — R471 Dysarthria and anarthria: Secondary | ICD-10-CM

## 2020-07-08 DIAGNOSIS — R41841 Cognitive communication deficit: Secondary | ICD-10-CM | POA: Diagnosis not present

## 2020-07-08 NOTE — Therapy (Signed)
[facility] [address] [place], [name], [address] Phone: [phone]   Fax:  [phone]  Speech Language Pathology Treatment  Patient Details  Name: Ronald Noble MRN: [id number] Date of Birth: [birth year] Referring Provider (SLP): Star Age, MD   Encounter Date: 07/08/2020   End of Session - 07/08/20 1114    Visit Number 6    Number of Visits 17    Date for SLP Re-Evaluation 08/15/20    SLP Start Time 1101    SLP Stop Time  1145    SLP Time Calculation (min) 44 min    Activity Tolerance Patient tolerated treatment well           Past Medical History:  Diagnosis Date  . BPH (benign prostatic hyperplasia)   . COVID 04/24/2020   scratchy throat x 1 day all symptoms resolved  . Parkinson disease ([place])   . Skin cancer of face    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  . Sleep apnea    mild no cpap used  . Urethral stricture     Past Surgical History:  Procedure Laterality Date  . BURR HOLE W/ STEREOTACTIC INSERTION OF DBS LEADS / INTRAOP MICROELECTRODE RECORDING  04/2014   [facility] in 2020  . COLONOSCOPY  2019  . CYSTOSCOPY WITH URETHRAL DILATATION N/A 06/15/2019   Procedure: CYSTOSCOPY WITH URETHRAL DILATATION;  Surgeon: Franchot Gallo, MD;  Location: [facility];  Service: Urology;  Laterality: N/A;  30 MINS  . CYSTOSCOPY WITH URETHRAL DILATATION N/A 05/09/2020   Procedure: CYSTOSCOPY WITH BALLOON DILATATION OF POSTERIOR URETHRAL STRICTURE, FLUOROSCOPIC INTERPRETATION;  Surgeon: Franchot Gallo, MD;  Location: [facility];  Service: Urology;  Laterality: N/A;  . KNEE SURGERY  yrs ago   exploratory/left  . SKIN CANCER EXCISION  01/2014   face  . TRANSURETHRAL RESECTION OF PROSTATE     2008    There were no vitals filed for this visit.   Subjective Assessment - 07/08/20 1101    Subjective "I watched some basketball this  weekend"    Currently in Pain? No/denies                 ADULT SLP TREATMENT - 07/08/20 1103      General Information   Behavior/Cognition Alert;Cooperative;Pleasant mood      Treatment Provided   Treatment provided Cognitive-Linquistic      Cognitive-Linquistic Treatment   Treatment focused on Dysarthria;Patient/family/caregiver education    Skilled Treatment Loud /a/ targeted with occasional min A for gentle onset and abdominal breathing. Loudness averaged mid 80s db. SLP cued longer pauses between repeitions as pt reported feeling "out of breath" after completion of 5 repetitions. Pt reported he has completed HEP with fluctuations in consistency endorsed. SLP targeted structured conversation for ~3-7 minutes, in which pt able to maintain appropriate conversational loudness with rare min A. Occasional min A required to use abdominal breathing during conversation. Pt demo'd improving insight of how breath support impacts his conversational speech and increases frequency of rushed speech. SLP recommended patient practice speaking with family/friends who can provide feedback re: breath support and loudness.      Assessment / Recommendations / Plan   Plan Continue with current plan of care      Progression Toward Goals   Progression toward goals Progressing toward goals            SLP Education - 07/08/20 1130    Education Details  abdominal breathing, compensations    Person(s) Educated Patient    Methods Explanation;Demonstration    Comprehension Verbalized understanding;Returned demonstration;Need further instruction            SLP Short Term Goals - 07/08/20 1115      SLP SHORT TERM GOAL #1   Title Pt will  maintain average low-mid 90sdB with loud /a/ for 4 sessions with rare min A for loudness    Baseline 06-28-20    Time 2    Period Weeks    Status On-going      SLP SHORT TERM GOAL #2   Title Pt will average low-mid 70sdB on structured speech tasks with rare  min A for three sessions    Baseline 07-01-20, 07-04-20, 07-08-20    Time --    Period --    Status Achieved      SLP SHORT TERM GOAL #3   Title Pt will maintain average low 70s dB over 12 minute simple conversation with rare min A, for 2 sessions    Baseline 07-04-20    Time 2    Period Weeks    Status On-going      SLP SHORT TERM GOAL #4   Title pt will maintain speech intelligibility at 95%+ using intelligibility compensations(due to rushed speech) for 8 minutes simple conversation in three sesssions    Baseline 07-04-20    Time 2    Period Weeks    Status On-going            SLP Long Term Goals - 07/08/20 1125      SLP LONG TERM GOAL #1   Title Pt will maintain low 70sdB over 10 minute mod complex conversation with rare min A for loudness in three therapy sessions    Time 6    Period Weeks   or 17 sessions   Status On-going      SLP LONG TERM GOAL #2   Title Pt will achieve 95%+ speech intelligibility outside of tx room (due to rushing speech) over 12 minutes mod complex conversation with modified independence (external cues) in three sessions    Time 6    Period Weeks    Status On-going      SLP LONG TERM GOAL #3   Title pt will maintain average low-mod 90s dB with loud /a/ in 7 total sessions    Time 6    Period Weeks    Status On-going      SLP LONG TERM GOAL #4   Title using speakerphone pt will demo articulation in a 5 minute conversation adequate for no more than 1 listener request for pt repeat    Time 6    Period Weeks    Status On-going            Plan - 07/08/20 1148    Clinical Impression Statement Ronald Noble presents today with mild dysarthria c/b rare fluctuations of loudness in conversation and occasional imprecise articulation. Pt occasionally exhibited "rushes" of speech during short structured conversations. Usual to occasional min A required to implement abdominal breathing in conversation due to SOB impacting rushes in speech and  overall speech intelligibility. Overall good conversational loudness exhibited in conversation this session. SLP recommends continuation of skilled ST to maximize communication effectiveness and improve QOL.    Speech Therapy Frequency 2x / week    Duration 8 weeks   or 17 total visits   Treatment/Interventions Compensatory techniques;Functional tasks;Cueing hierarchy;SLP instruction and feedback;Patient/family education;Internal/external aids  Potential to Achieve Goals Good    Potential Considerations Cooperation/participation level;Ability to learn/carryover information    SLP Home Exercise Plan provided    Consulted and Agree with Plan of Care Patient           Patient will benefit from skilled therapeutic intervention in order to improve the following deficits and impairments:   Dysarthria and anarthria  Cognitive communication deficit    Problem List Patient Active Problem List   Diagnosis Date Noted  . Bilateral olecranon bursitis 07/05/2019  . Other bursitis of knee, left knee 12/06/2018  . Pain in right leg 12/06/2018  . Parkinson disease ([place])     Alinda Deem, MA CCC-SLP 07/08/2020, 11:54 AM  [facility] [address] [place], [name], [address] Phone: [phone]   Fax:  [phone]   Name: Ronald Noble MRN: [id number] Date of Birth: 1946/01/25

## 2020-07-17 ENCOUNTER — Ambulatory Visit: Payer: Medicare Other

## 2020-07-17 ENCOUNTER — Other Ambulatory Visit: Payer: Self-pay

## 2020-07-17 DIAGNOSIS — R471 Dysarthria and anarthria: Secondary | ICD-10-CM | POA: Diagnosis not present

## 2020-07-17 DIAGNOSIS — R41841 Cognitive communication deficit: Secondary | ICD-10-CM

## 2020-07-17 NOTE — Therapy (Signed)
[facility] [address] [place], [name], [address] Phone: [phone]   Fax:  [phone]  Speech Language Pathology Treatment  Patient Details  Name: Ronald Noble MRN: [id number] Date of Birth: [birth year] Referring Provider (SLP): Star Age, MD   Encounter Date: 07/17/2020   End of Session - 07/17/20 1528    Visit Number 7    Number of Visits 17    Date for SLP Re-Evaluation 08/15/20    SLP Start Time 0934    SLP Stop Time  1015    SLP Time Calculation (min) 41 min    Activity Tolerance Patient tolerated treatment well           Past Medical History:  Diagnosis Date  . BPH (benign prostatic hyperplasia)   . COVID 04/24/2020   scratchy throat x 1 day all symptoms resolved  . Parkinson disease ([place])   . Skin cancer of face    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  . Sleep apnea    mild no cpap used  . Urethral stricture     Past Surgical History:  Procedure Laterality Date  . BURR HOLE W/ STEREOTACTIC INSERTION OF DBS LEADS / INTRAOP MICROELECTRODE RECORDING  04/2014   [facility] in [facility]  . COLONOSCOPY  2019  . CYSTOSCOPY WITH URETHRAL DILATATION N/A 06/15/2019   Procedure: CYSTOSCOPY WITH URETHRAL DILATATION;  Surgeon: Franchot Gallo, MD;  [facility]: [facility];  Service: Urology;  Laterality: N/A;  30 MINS  . CYSTOSCOPY WITH URETHRAL DILATATION N/A 05/09/2020   Procedure: CYSTOSCOPY WITH BALLOON DILATATION OF POSTERIOR URETHRAL STRICTURE, FLUOROSCOPIC INTERPRETATION;  Surgeon: Franchot Gallo, MD;  Location: [facility];  Service: Urology;  Laterality: N/A;  . KNEE SURGERY  yrs ago   exploratory/left  . SKIN CANCER EXCISION  01/2014   face  . TRANSURETHRAL RESECTION OF PROSTATE     2008    There were no vitals filed for this visit.   Subjective Assessment - 07/17/20 0946    Subjective "If I remember to do it, I'll do it.  (re: /a/)    Currently in Pain? No/denies                 ADULT SLP TREATMENT - 07/17/20 0947      General Information   Behavior/Cognition Alert;Cooperative;Pleasant mood      Treatment Provided   Treatment provided Cognitive-Linquistic      Cognitive-Linquistic Treatment   Treatment focused on Dysarthria;Patient/family/caregiver education    Skilled Treatment Pt reports he has worked on breathing more than working on loud /a/. SLP reiterated to pt that he needs to also work on /a/. SLP tried to work with pt about how to make a reminder or a system to assist his memory in completing loud /a/. Pt was unsure if that would be helpful or not. SLP gave pt two post it notes to use to see if this improves his /a/ frequency. Loud /a/ targeted to improve muscle memory on conversational loudness. Average mid 80s dB adn pt took good abdominal breath (AB). In short (2-3 minute)converstaional segments x3, pt maintained volume in low 70s dB. Pt did demo rare rushes of speech and req'd occasional min A for AB in these times. Pt homework to give 2-3 minute responses focusing on AB, volume, and posture.      Assessment / Recommendations / Plan   Plan Continue with current plan of care  Progression Toward Goals   Progression toward goals Progressing toward goals              SLP Short Term Goals - 07/17/20 1530      SLP SHORT TERM GOAL #1   Title Pt will  maintain average low-mid 90sdB with loud /a/ for 4 sessions with rare min A for loudness    Baseline 06-28-20    Time 1    Period Weeks    Status On-going      SLP SHORT TERM GOAL #2   Title Pt will average low-mid 70sdB on structured speech tasks with rare min A for three sessions    Baseline 07-01-20, 07-04-20, 07-08-20    Status Achieved      SLP SHORT TERM GOAL #3   Title Pt will maintain average low 70s dB over 12 minute simple conversation with rare min A, for 2 sessions    Baseline 07-04-20    Status Achieved      SLP  SHORT TERM GOAL #4   Title pt will maintain speech intelligibility at 95%+ using intelligibility compensations(due to rushed speech) for 8 minutes simple conversation in three sesssions    Baseline 07-04-20    Time 1    Period Weeks    Status On-going            SLP Long Term Goals - 07/17/20 1530      SLP LONG TERM GOAL #1   Title Pt will maintain low 70sdB over 10 minute mod complex conversation with rare min A for loudness in three therapy sessions    Time 5    Period Weeks   or 17 sessions   Status On-going      SLP LONG TERM GOAL #2   Title Pt will achieve 95%+ speech intelligibility outside of tx room (due to rushing speech) over 12 minutes mod complex conversation with modified independence (external cues) in three sessions    Time 5    Period Weeks    Status On-going      SLP LONG TERM GOAL #3   Title pt will maintain average low-mod 90s dB with loud /a/ in 7 total sessions    Time 5    Period Weeks    Status On-going      SLP LONG TERM GOAL #4   Title using speakerphone pt will demo articulation in a 5 minute conversation adequate for no more than 1 listener request for pt repeat    Time 5    Period Weeks    Status On-going            Plan - 07/17/20 1529    Clinical Impression Statement Ronald Noble presents today with mild dysarthria c/b rare fluctuations of loudness in conversation and occasional imprecise articulation. Pt occasionally exhibited "rushes" of speech during short structured conversations. Overall good conversational loudness exhibited in conversation this session. Pt remarked he also needs to work on his posture. SLP recommends continuation of skilled ST to maximize communication effectiveness and improve QOL.    Speech Therapy Frequency 2x / week    Duration 8 weeks   or 17 total visits   Treatment/Interventions Compensatory techniques;Functional tasks;Cueing hierarchy;SLP instruction and feedback;Patient/family education;Internal/external  aids    Potential to Achieve Goals Good    Potential Considerations Cooperation/participation level;Ability to learn/carryover information    SLP Home Exercise Plan provided    Consulted and Agree with Plan of Care Patient  Patient will benefit from skilled therapeutic intervention in order to improve the following deficits and impairments:   Dysarthria and anarthria  Cognitive communication deficit    Problem List Patient Active Problem List   Diagnosis Date Noted  . Bilateral olecranon bursitis 07/05/2019  . Other bursitis of knee, left knee 12/06/2018  . Pain in right leg 12/06/2018  . Parkinson disease ([place])     Andrew ,[place], [place]  07/17/2020, 3:31 PM  [facility] [address] [place], [name], [address] Phone: [phone]   Fax:  [phone]   Name: Ronald Noble MRN: [id number] Date of Birth: Mar 11, 1946

## 2020-07-17 NOTE — Patient Instructions (Signed)
  Please complete the assigned speech therapy homework prior to your next session, working 30 minutes a day on the worksheets.

## 2020-07-22 ENCOUNTER — Ambulatory Visit: Payer: Medicare Other

## 2020-07-23 ENCOUNTER — Ambulatory Visit: Payer: Medicare Other

## 2020-07-23 ENCOUNTER — Other Ambulatory Visit: Payer: Self-pay

## 2020-07-23 DIAGNOSIS — K117 Disturbances of salivary secretion: Secondary | ICD-10-CM | POA: Diagnosis not present

## 2020-07-23 DIAGNOSIS — R41841 Cognitive communication deficit: Secondary | ICD-10-CM

## 2020-07-23 DIAGNOSIS — R471 Dysarthria and anarthria: Secondary | ICD-10-CM

## 2020-07-23 DIAGNOSIS — G245 Blepharospasm: Secondary | ICD-10-CM | POA: Diagnosis not present

## 2020-07-23 NOTE — Therapy (Signed)
[facility] [address] [place], [name], [address] Phone: [phone]   Fax:  [phone]  Speech Language Pathology Treatment  Patient Details  Name: Ronald Noble MRN: [id number] Date of Birth: Aug 25, 1945 Referring Provider (SLP): Star Age, MD   Encounter Date: 07/23/2020   End of Session - 07/23/20 0854    Visit Number 8    Number of Visits 17    Date for SLP Re-Evaluation 08/15/20    SLP Start Time 0804    SLP Stop Time  0840    SLP Time Calculation (min) 36 min    Activity Tolerance Patient tolerated treatment well           Past Medical History:  Diagnosis Date  . BPH (benign prostatic hyperplasia)   . COVID 04/24/2020   scratchy throat x 1 day all symptoms resolved  . Parkinson disease ([place])   . Skin cancer of face    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  . Sleep apnea    mild no cpap used  . Urethral stricture     Past Surgical History:  Procedure Laterality Date  . BURR HOLE W/ STEREOTACTIC INSERTION OF DBS LEADS / INTRAOP MICROELECTRODE RECORDING  04/2014   [facility] in [facility]  . COLONOSCOPY  2019  . CYSTOSCOPY WITH URETHRAL DILATATION N/A 06/15/2019   Procedure: CYSTOSCOPY WITH URETHRAL DILATATION;  Surgeon: Franchot Gallo, MD;  Location: [facility]  Service: Urology;  Laterality: N/A;  30 MINS  . CYSTOSCOPY WITH URETHRAL DILATATION N/A 05/09/2020   Procedure: CYSTOSCOPY WITH BALLOON DILATATION OF POSTERIOR URETHRAL STRICTURE, FLUOROSCOPIC INTERPRETATION;  Surgeon: Franchot Gallo, MD;  Location: [facility];  Service: Urology;  Laterality: N/A;  . KNEE SURGERY  yrs ago   exploratory/left  . SKIN CANCER EXCISION  01/2014   face  . TRANSURETHRAL RESECTION OF PROSTATE     2008    There were no vitals filed for this visit.   Subjective Assessment - 07/23/20 0808    Subjective "Yesterday I had the opportunity to  talk over the ambient noise in the car for 4 hours. Sometimes I looked at the driver and sometimes I didn't."    Currently in Pain? No/denies                 ADULT SLP TREATMENT - 07/23/20 0809      General Information   Behavior/Cognition Alert;Cooperative;Pleasant mood      Treatment Provided   Treatment provided Cognitive-Linquistic      Cognitive-Linquistic Treatment   Treatment focused on Dysarthria;Patient/family/caregiver education    Skilled Treatment "My cousin we went to visit has a hearing aid, so I was talking louder to begin with." Pt told SLP that this time of day is his "best time of day" before his morning meds. Loud /a/ completed with average high 80s dB. In a 5 minute monologue about his exercises pt maintained a WNL volume. In a 15 minute conversation about his weekend and his cousin's life pt kept volume at WNL range, with breath support via abdominal breathing as WNL Pt again attributed this to his medication cycle. SLP encourraged pt to keep his appointments with afternoon so he and SLP worked to get all his appointmetns after 2:30. Pt maintained WNL volume during this time.      Assessment / Recommendations / Plan   Plan Continue with current plan of care      Progression Toward Goals  Progression toward goals Progressing toward goals              SLP Short Term Goals - 07/23/20 0855      SLP SHORT TERM GOAL #1   Title Pt will  maintain average low-mid 90sdB with loud /a/ for 4 sessions with rare min A for loudness    Baseline 06-28-20    Status Partially Met      SLP SHORT TERM GOAL #2   Title Pt will average low-mid 70sdB on structured speech tasks with rare min A for three sessions    Baseline 07-01-20, 07-04-20, 07-08-20    Status Achieved      SLP SHORT TERM GOAL #3   Title Pt will maintain average low 70s dB over 12 minute simple conversation with rare min A, for 2 sessions    Baseline 07-04-20    Status Achieved      SLP SHORT TERM GOAL #4    Title pt will maintain speech intelligibility at 95%+ using intelligibility compensations(due to rushed speech) for 8 minutes simple conversation in three sesssions    Baseline 07-04-20    Time 1    Status Partially Met            SLP Long Term Goals - 07/23/20 0856      SLP LONG TERM GOAL #1   Title Pt will maintain low 70sdB over 10 minute mod complex conversation with rare min A for loudness in three therapy sessions    Baseline 07-23-20    Time 4    Period Weeks   or 17 sessions   Status On-going      SLP LONG TERM GOAL #2   Title Pt will achieve 95%+ speech intelligibility outside of tx room (due to rushing speech) over 12 minutes mod complex conversation with modified independence (external cues) in three sessions    Time 4    Period Weeks    Status On-going      SLP LONG TERM GOAL #3   Title pt will maintain average low-mod 90s dB with loud /a/ in 7 total sessions    Time 4    Period Weeks    Status On-going      SLP LONG TERM GOAL #4   Title using speakerphone pt will demo articulation in a 5 minute conversation adequate for no more than 1 listener request for pt repeat    Time 4    Period Weeks    Status On-going            Plan - 07/23/20 0854    Clinical Impression Statement [name] presents today with mild dysarthria c/b rare fluctuations of loudness in conversation and occasional imprecise articulation. Pt rarely exhibited "rushes" of speech today during conversations. Overall good conversational loudness exhibited in conversation this session. SLP and pt worked to change all patient's appointmetns to the afternoons when he states he has more issues with his speech clarity. SLP recommends continuation of skilled ST to maximize communication effectiveness and improve QOL.    Speech Therapy Frequency 2x / week    Duration 8 weeks   or 17 total visits   Treatment/Interventions Compensatory techniques;Functional tasks;Cueing hierarchy;SLP instruction  and feedback;Patient/family education;Internal/external aids    Potential to Achieve Goals Good    Potential Considerations Cooperation/participation level;Ability to learn/carryover information    SLP Home Exercise Plan provided    Consulted and Agree with Plan of Care Patient  Patient will benefit from skilled therapeutic intervention in order to improve the following deficits and impairments:   Dysarthria and anarthria  Cognitive communication deficit    Problem List Patient Active Problem List   Diagnosis Date Noted  . Bilateral olecranon bursitis 07/05/2019  . Other bursitis of knee, left knee 12/06/2018  . Pain in right leg 12/06/2018  . Parkinson disease ([place])     [place] ,[place], [place]  07/23/2020, 8:57 AM  [facility] [address] [place], [name], [address] Phone: [phone]   Fax:  [phone]   Name: Jamale Spangler MRN: [id number] Date of Birth: [birth year]

## 2020-07-24 ENCOUNTER — Ambulatory Visit: Payer: Medicare Other

## 2020-07-24 DIAGNOSIS — N5201 Erectile dysfunction due to arterial insufficiency: Secondary | ICD-10-CM | POA: Diagnosis not present

## 2020-07-24 DIAGNOSIS — R471 Dysarthria and anarthria: Secondary | ICD-10-CM | POA: Diagnosis not present

## 2020-07-24 DIAGNOSIS — N35011 Post-traumatic bulbous urethral stricture: Secondary | ICD-10-CM | POA: Diagnosis not present

## 2020-07-24 DIAGNOSIS — R41841 Cognitive communication deficit: Secondary | ICD-10-CM | POA: Diagnosis not present

## 2020-07-24 NOTE — Therapy (Signed)
[facility] [address] [place], [name], [address] Phone: [phone]   Fax:  [phone]  Speech Language Pathology Treatment  Patient Details  Name: Ronald Noble MRN: [id number] Date of Birth: 08/24/1945 Referring Provider (SLP): Star Age, MD   Encounter Date: 07/24/2020   End of Session - 07/24/20 1818    Visit Number 9    Number of Visits 17    Date for SLP Re-Evaluation 08/15/20    SLP Start Time 9244   pt arrived 6 minutes late   SLP Stop Time  1700    SLP Time Calculation (min) 39 min    Activity Tolerance Patient tolerated treatment well           Past Medical History:  Diagnosis Date  . BPH (benign prostatic hyperplasia)   . COVID 04/24/2020   scratchy throat x 1 day all symptoms resolved  . Parkinson disease ([place])   . Skin cancer of face    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  . Sleep apnea    mild no cpap used  . Urethral stricture     Past Surgical History:  Procedure Laterality Date  . BURR HOLE W/ STEREOTACTIC INSERTION OF DBS LEADS / INTRAOP MICROELECTRODE RECORDING  04/2014   [facility] in [facility]  . COLONOSCOPY  2019  . CYSTOSCOPY WITH URETHRAL DILATATION N/A 06/15/2019   Procedure: CYSTOSCOPY WITH URETHRAL DILATATION;  Surgeon: Franchot Gallo, MD;  Location: [facility];  Service: Urology;  Laterality: N/A;  30 MINS  . CYSTOSCOPY WITH URETHRAL DILATATION N/A 05/09/2020   Procedure: CYSTOSCOPY WITH BALLOON DILATATION OF POSTERIOR URETHRAL STRICTURE, FLUOROSCOPIC INTERPRETATION;  Surgeon: Franchot Gallo, MD;  [facility];  Service: Urology;  Laterality: N/A;  . KNEE SURGERY  yrs ago   exploratory/left  . SKIN CANCER EXCISION  01/2014   face  . TRANSURETHRAL RESECTION OF PROSTATE     2008    There were no vitals filed for this visit.   Subjective Assessment - 07/24/20 1623    Subjective "I reduced  my dosage. I feel relaxed"    Currently in Pain? No/denies                 ADULT SLP TREATMENT - 07/24/20 1628      General Information   Behavior/Cognition Alert;Cooperative;Pleasant mood      Treatment Provided   Treatment provided Cognitive-Linquistic      Cognitive-Linquistic Treatment   Treatment focused on Dysarthria;Patient/family/caregiver education    Skilled Treatment Loud /a/ completed x10 repetitions, in which pt achieved low 90s db. Pt demonstrates occasional improved awareness and utilization of compensations. Pt aware of strain in /a/ trials x1. Pt stated "I'm going to picture you to outside the window to help my loudness." In slightly structured conversation of ~10 minutes, pt averaged low 70s db with rare non-verbal cues. SLP recommended continued practice of use of compensations at home and work to promote increased carryover across settings.      Assessment / Recommendations / Plan   Plan Continue with current plan of care      Progression Toward Goals   Progression toward goals Progressing toward goals            SLP Education - 07/24/20 1818    Education Details compensations, visual feedback    Person(s) Educated Patient    Methods Explanation;Demonstration;Verbal cues    Comprehension Verbalized understanding;Returned demonstration;Verbal cues required  SLP Short Term Goals - 07/23/20 0855      SLP SHORT TERM GOAL #1   Title Pt will  maintain average low-mid 90sdB with loud /a/ for 4 sessions with rare min A for loudness    Baseline 06-28-20    Status Partially Met      SLP SHORT TERM GOAL #2   Title Pt will average low-mid 70sdB on structured speech tasks with rare min A for three sessions    Baseline 07-01-20, 07-04-20, 07-08-20    Status Achieved      SLP SHORT TERM GOAL #3   Title Pt will maintain average low 70s dB over 12 minute simple conversation with rare min A, for 2 sessions    Baseline 07-04-20    Status Achieved       SLP SHORT TERM GOAL #4   Title pt will maintain speech intelligibility at 95%+ using intelligibility compensations(due to rushed speech) for 8 minutes simple conversation in three sesssions    Baseline 07-04-20    Time 1    Status Partially Met            SLP Long Term Goals - 07/24/20 1819      SLP LONG TERM GOAL #1   Title Pt will maintain low 70sdB over 10 minute mod complex conversation with rare min A for loudness in three therapy sessions    Baseline 07-23-20, 07-24-20    Time 4    Period Weeks   or 17 sessions   Status On-going      SLP LONG TERM GOAL #2   Title Pt will achieve 95%+ speech intelligibility outside of tx room (due to rushing speech) over 12 minutes mod complex conversation with modified independence (external cues) in three sessions    Time 4    Period Weeks    Status On-going      SLP LONG TERM GOAL #3   Title pt will maintain average low-mod 90s dB with loud /a/ in 7 total sessions    Baseline 07-24-20    Time 4    Period Weeks    Status On-going      SLP LONG TERM GOAL #4   Title using speakerphone pt will demo articulation in a 5 minute conversation adequate for no more than 1 listener request for pt repeat    Time 4    Period Weeks    Status On-going            Plan - 07/24/20 1820    Clinical Impression Statement Ronald Noble presents today with mild dysarthria c/b rare fluctuations of loudness in conversation and occasional imprecise articulation. Pt rarely exhibited "rushes" of speech today during conversations. Overall good conversational loudness exhibited in conversation this session with rare non-verbal cues. SLP recommends continuation of skilled ST to maximize communication effectiveness and improve QOL.    Speech Therapy Frequency 2x / week    Duration 8 weeks   or 17 total visits   Treatment/Interventions Compensatory techniques;Functional tasks;Cueing hierarchy;SLP instruction and feedback;Patient/family  education;Internal/external aids    Potential to Achieve Goals Good    Potential Considerations Cooperation/participation level;Ability to learn/carryover information    SLP Home Exercise Plan provided    Consulted and Agree with Plan of Care Patient           Patient will benefit from skilled therapeutic intervention in order to improve the following deficits and impairments:   Dysarthria and anarthria    Problem List Patient Active Problem List  Diagnosis Date Noted  . Bilateral olecranon bursitis 07/05/2019  . Other bursitis of knee, left knee 12/06/2018  . Pain in right leg 12/06/2018  . Parkinson disease ([place])     Alinda Deem, MA CCC-SLP 07/24/2020, 6:21 PM  [name] [address] [place], [name], [address] Phone: [phone]   Fax:  [phone]   Name: Ronald Noble MRN: [id number] Date of Birth: [birth year]

## 2020-07-29 ENCOUNTER — Other Ambulatory Visit: Payer: Self-pay

## 2020-07-29 ENCOUNTER — Ambulatory Visit: Payer: Medicare Other

## 2020-07-29 DIAGNOSIS — R41841 Cognitive communication deficit: Secondary | ICD-10-CM | POA: Diagnosis not present

## 2020-07-29 DIAGNOSIS — R471 Dysarthria and anarthria: Secondary | ICD-10-CM | POA: Diagnosis not present

## 2020-07-29 NOTE — Therapy (Signed)
[facility] [address] [place], [name], [address] Phone: [phone]   Fax:  [phone]  Speech Language Pathology Treatment-Progress Note  Patient Details  Name: Ronald Noble MRN: [id number] Date of Birth: 1945/10/13 Referring Provider (SLP): Star Age, MD   Encounter Date: 07/29/2020   Speech Therapy Progress Note  Dates of Reporting Period: 05-17-20 to current  Objective Reports of Subjective Statement: Pt has participated in 10 ST sessions targeting mild dysarthria secondary to Parkinson's disease. Pt exhibits improvements in speech volume and intelligibility given ST education and training.  Objective Measurements: Pt presents with improvements in volume intensity and volume averaging low 70s db (WNL) with occasional fading to rare min A to utilize abdominal breathing, slow rate of speech, and over-enunciation at sentence, paragraph, and conversational levels.  Goal Update: see goals below   Plan: Continue per POC  Reason Skilled Services are Required: to increase communication effectiveness for work and home for improved QOL    End of Session - 07/29/20 1631    Visit Number 10    Number of Visits 17    Date for SLP Re-Evaluation 08/15/20    SLP Start Time 1618    SLP Stop Time  1700    SLP Time Calculation (min) 42 min    Activity Tolerance Patient tolerated treatment well            Past Medical History:  Diagnosis Date  . BPH (benign prostatic hyperplasia)   . COVID 04/24/2020   scratchy throat x 1 day all symptoms resolved  . Parkinson disease ([place])   . Skin cancer of face    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  . Sleep apnea    mild no cpap used  . Urethral stricture     Past Surgical History:  Procedure Laterality Date  . BURR HOLE W/ STEREOTACTIC INSERTION OF DBS LEADS / INTRAOP MICROELECTRODE RECORDING  04/2014   [facility] in [facility]  .  COLONOSCOPY  2019  . CYSTOSCOPY WITH URETHRAL DILATATION N/A 06/15/2019   Procedure: CYSTOSCOPY WITH URETHRAL DILATATION;  Surgeon: Franchot Gallo, MD;  Location: [facility];  Service: Urology;  Laterality: N/A;  30 MINS  . CYSTOSCOPY WITH URETHRAL DILATATION N/A 05/09/2020   Procedure: CYSTOSCOPY WITH BALLOON DILATATION OF POSTERIOR URETHRAL STRICTURE, FLUOROSCOPIC INTERPRETATION;  Surgeon: Franchot Gallo, MD;  Location: [facility];  Service: Urology;  Laterality: N/A;  . KNEE SURGERY  yrs ago   exploratory/left  . SKIN CANCER EXCISION  01/2014   face  . TRANSURETHRAL RESECTION OF PROSTATE     2008    There were no vitals filed for this visit.   Subjective Assessment - 07/29/20 1619    Subjective "it's going good"    Currently in Pain? No/denies                ADULT SLP TREATMENT - 07/29/20 1620      General Information   Behavior/Cognition Alert;Cooperative;Pleasant mood      Treatment Provided   Treatment provided Cognitive-Linquistic      Cognitive-Linquistic Treatment   Treatment focused on Dysarthria;Patient/family/caregiver education    Skilled Treatment Pt reports having reduced PD medication without consult of MD, in which SLP recommending pt follow up with MD. Pt reports occasionally reports speaking too fast in order to "quickly get idea out" which impacts speech intelligibility. Loud /a/ completed x10 repetitions with average of low 90s db. Pt averaged low 70s  db in structured parapraph level readings and in converation with rare min A. SLP used recordings to improve awareness of AB and rushes of speech. SLP recommended reading aloud for practice      Assessment / Recommendations / [place] with current plan of care      Progression Toward Goals   Progression toward goals Progressing toward goals            SLP Education - 07/29/20 1621    Education Details compensations, functional practice    Person(s)  Educated Patient    Methods Explanation;Demonstration;Handout    Comprehension Verbalized understanding;Returned demonstration;Need further instruction            SLP Short Term Goals - 07/23/20 0855      SLP SHORT TERM GOAL #1   Title Pt will  maintain average low-mid 90sdB with loud /a/ for 4 sessions with rare min A for loudness    Baseline 06-28-20    Status Partially Met      SLP SHORT TERM GOAL #2   Title Pt will average low-mid 70sdB on structured speech tasks with rare min A for three sessions    Baseline 07-01-20, 07-04-20, 07-08-20    Status Achieved      SLP SHORT TERM GOAL #3   Title Pt will maintain average low 70s dB over 12 minute simple conversation with rare min A, for 2 sessions    Baseline 07-04-20    Status Achieved      SLP SHORT TERM GOAL #4   Title pt will maintain speech intelligibility at 95%+ using intelligibility compensations(due to rushed speech) for 8 minutes simple conversation in three sesssions    Baseline 07-04-20    Time 1    Status Partially Met            SLP Long Term Goals - 07/29/20 1630      SLP LONG TERM GOAL #1   Title Pt will maintain low 70sdB over 10 minute mod complex conversation with rare min A for loudness in three therapy sessions    Baseline 07-23-20, 07-24-20    Time 3    Period Weeks   or 17 sessions   Status On-going      SLP LONG TERM GOAL #2   Title Pt will achieve 95%+ speech intelligibility outside of tx room (due to rushing speech) over 12 minutes mod complex conversation with modified independence (external cues) in three sessions    Time 3    Period Weeks    Status On-going      SLP LONG TERM GOAL #3   Title pt will maintain average low-mod 90s dB with loud /a/ in 7 total sessions    Baseline 07-24-20, 07-29-20    Time 3    Period Weeks    Status On-going      SLP LONG TERM GOAL #4   Title using speakerphone pt will demo articulation in a 5 minute conversation adequate for no more than 1 listener request for  pt repeat    Time 3    Period Weeks    Status On-going            Plan - 07/29/20 1641    Clinical Impression Statement Ronald Noble presents today with mild dysarthria c/b rare fluctuations of loudness in conversation and occasional imprecise articulation. Pt reports occasional "rushes" of speech particularly during dictation at work. Overall good conversational loudness exhibited during paragraph level reading tasks and in  conversation this session with occasional fading to rare min A. Auditory feedback was effective in increasing pt's awareness of rushed speech and decreased use of abdominal breathing. SLP recommends continuation of skilled ST to maximize communication effectiveness and improve QOL.    Speech Therapy Frequency 2x / week    Duration 8 weeks   or 17 total visits   Treatment/Interventions Compensatory techniques;Functional tasks;Cueing hierarchy;SLP instruction and feedback;Patient/family education;Internal/external aids    Potential to Achieve Goals Good    Potential Considerations Cooperation/participation level;Ability to learn/carryover information    SLP Home Exercise Plan provided    Consulted and Agree with Plan of Care Patient           Patient will benefit from skilled therapeutic intervention in order to improve the following deficits and impairments:   Dysarthria and anarthria    Problem List Patient Active Problem List   Diagnosis Date Noted  . Bilateral olecranon bursitis 07/05/2019  . Other bursitis of knee, left knee 12/06/2018  . Pain in right leg 12/06/2018  . Parkinson disease ([place])     Alinda Deem, MA CCC-SLP 07/29/2020, 6:52 PM  [facility] [address] [place], [name], [address] Phone: [phone]   Fax:  [phone]   Name: Ronald Noble MRN: [id number] Date of Birth: 28-Feb-1946

## 2020-08-01 ENCOUNTER — Ambulatory Visit: Payer: Medicare Other

## 2020-08-01 ENCOUNTER — Other Ambulatory Visit: Payer: Self-pay

## 2020-08-01 ENCOUNTER — Ambulatory Visit (HOSPITAL_COMMUNITY)
Admission: RE | Admit: 2020-08-01 | Discharge: 2020-08-01 | Disposition: A | Discharge status: Home / Self Care | Payer: Medicare Other | Source: Ambulatory Visit | Attending: Cardiology | Admitting: Cardiology

## 2020-08-01 ENCOUNTER — Encounter (HOSPITAL_COMMUNITY): Payer: Self-pay | Admitting: Cardiology

## 2020-08-01 VITALS — BP 138/86 | HR 61 | Ht 71.0 in | Wt 155.8 lb

## 2020-08-01 DIAGNOSIS — I2510 Atherosclerotic heart disease of native coronary artery without angina pectoris: Secondary | ICD-10-CM | POA: Diagnosis not present

## 2020-08-01 DIAGNOSIS — I238 Other current complications following acute myocardial infarction: Secondary | ICD-10-CM | POA: Diagnosis not present

## 2020-08-01 DIAGNOSIS — G20 Parkinson's disease: Secondary | ICD-10-CM | POA: Diagnosis not present

## 2020-08-01 DIAGNOSIS — Z8249 Family history of ischemic heart disease and other diseases of the circulatory system: Secondary | ICD-10-CM | POA: Insufficient documentation

## 2020-08-01 DIAGNOSIS — Z7983 Long term (current) use of bisphosphonates: Secondary | ICD-10-CM | POA: Diagnosis not present

## 2020-08-01 DIAGNOSIS — E785 Hyperlipidemia, unspecified: Secondary | ICD-10-CM | POA: Insufficient documentation

## 2020-08-01 DIAGNOSIS — Z9682 Presence of neurostimulator: Secondary | ICD-10-CM | POA: Insufficient documentation

## 2020-08-01 DIAGNOSIS — Z13220 Encounter for screening for lipoid disorders: Secondary | ICD-10-CM

## 2020-08-01 DIAGNOSIS — Z79899 Other long term (current) drug therapy: Secondary | ICD-10-CM | POA: Diagnosis not present

## 2020-08-01 MED ORDER — COQ10 200 MG PO CAPS: 200.0000 mg | ORAL_CAPSULE | Freq: Every day | ORAL | 3 refills | Status: AC

## 2020-08-01 MED ORDER — ASPIRIN EC 81 MG PO TBEC: 81.0000 mg | DELAYED_RELEASE_TABLET | Freq: Every day | ORAL | 2 refills | Status: AC

## 2020-08-01 MED ORDER — ROSUVASTATIN CALCIUM 5 MG PO TABS: 5.0000 mg | ORAL_TABLET | Freq: Every day | ORAL | 11 refills | Status: DC

## 2020-08-01 NOTE — Progress Notes (Signed)
PCP: Ronald Infante, MD Cardiology: Dr. Aundra Dubin  75 y.o. with history of Parkinsons was self-referred for evaluation of abnormal calcium score scan.  Patient has no cardiac history.  He does have a long history of Parkinsons disease.  He has a deep brain stimulator followed at [place] is characterized by dysphonia and akinetic rigidity.  He remains mobile and is able to ride a bike for exercise.    He had a calcium score scan done at [facility] recently.  I am not able to review the images, but there was comment about a "minimally calcified papillary muscle that could be associated with prior myocardial infarction." CAC placed him in the 64th percentile for age and gender, but absolute score was > 400 Agatston units and most of the calcium was located in the LAD.  He does not get chest pain/tightness/heaviness with exertion.  He is limited by parkinsonian rigidity but generally does not get exertional dyspnea.  He will note dyspnea with heavy activity.  He rides a bike for exercise and is able to do yardwork.   ECG (personally reviewed): NSR, normal  PMH: 1. Parkinsons disease: s/p deep brain stimulator (followed at [facility]).  - H/o orthostatic hypotension thought to be related to Parkinsons.  2. H/o melanoma 3. BPH s/p TURP 4. CAD: Calcium score 4/22 410 Agatston units (355 in LAD), 64th percentile for age/gender.  Also noted was "minimally calcified papillary muscle that could be associated with prior myocardial infarction."   SH: Practicing attorney, married, nonsmoker, rare ETOH.   FH: Uncle with MI (was a smoker).   ROS: All systems reviewed and negative except as per HPI.   Current Outpatient Medications  Medication Sig Dispense Refill  . amantadine (SYMMETREL) 100 MG capsule 2 (two) times daily.   2  . aspirin EC 81 MG tablet Take 1 tablet (81 mg total) by mouth daily. Swallow whole. 90 tablet 2  . carbidopa-levodopa (SINEMET IR) 25-100 MG tablet Take 1.5 tablets  by mouth 5 (five) times daily.    . cholecalciferol (VITAMIN D3) 25 MCG (1000 UNIT) tablet Take 2,000 Units by mouth daily.    . clonazePAM (KLONOPIN) 0.25 MG disintegrating tablet Take 2 tablets (0.5 mg total) by mouth at bedtime. (Patient taking differently: Take 0.25 mg by mouth at bedtime.) 60 tablet 5  . Coenzyme Q10 (COQ10) 200 MG CAPS Take 200 mg by mouth daily. 90 capsule 3  . ibandronate (BONIVA) 150 MG tablet Take 150 mg by mouth every 30 (thirty) days. Take in the morning with a full glass of water, on an empty stomach, and do not take anything else by mouth or lie down for the next 30 min.    . Ropinirole HCl 6 MG TB24 daily. 1 tablet at 5 pm  0  . rosuvastatin (CRESTOR) 5 MG tablet Take 1 tablet (5 mg total) by mouth daily. 30 tablet 11  . sildenafil (REVATIO) 20 MG tablet Take 20 mg by mouth as needed.     No current facility-administered medications for this encounter.   BP 138/86   Pulse 61   Ht 5\' 11"  (1.803 m)   Wt 70.7 kg (155 lb 12.8 oz)   SpO2 98%   BMI 21.73 kg/m  General: NAD Neck: No JVD, no thyromegaly or thyroid nodule.  Lungs: Clear to auscultation bilaterally with normal respiratory effort. CV: Nondisplaced PMI.  Heart regular S1/S2, no S3/S4, no murmur.  1+ ankle edema.  No carotid bruit.  Normal pedal  pulses.  Abdomen: Soft, nontender, no hepatosplenomegaly, no distention.  Skin: Intact without lesions or rashes.  Neurologic: Alert and oriented x 3.  Psych: Normal affect. Extremities: No clubbing or cyanosis.  HEENT: Normal.   Assessment/Plan: 1. CAD: Patient has CAD based on coronary calcium score scan.  64th percentile for age/gender suggests intermediate risk, but absolute score > 400 makes hemodynamically significant CAD more likely and calcium is concentrated in the LAD.  He does not have exertional chest pain.  There is concern based on the CT that a "mildly calcified" papillary muscle may represent prior MI.  Alternatively, this could just be  degenerative calcification of the mitral valve apparatus. He has had no past episode that could be identified as a myocardial infarction.  His ECG is normal.  - I suggested that he start ASA 81 mg daily.  - I started him on Crestor 5 mg daily.  I started him on low dose as he has Parkinson's disease and is concerned about possible effects of the statin on his muscular strength.  I will call his PCP to get a copy of the most recent lipids and will have him get Lipids/LFTs after 2 months on statin.  He can take over the counter Coenzyme Q10 200 mg daily to decrease risk of statin-related myalgias.  - I am going to arrange for an echocardiogram to assess for LV wall motion abnormalities consistent with prior MI and also to assess the mitral valve apparatus for degenerative calcification and mitral regurgitation (no murmur heard). Further workup will depend on the results of the echo.  2. Hyperlipidemia: Starting statin as above, need to get recent lipids from PCP.   Loralie Champagne 08/01/2020

## 2020-08-01 NOTE — Patient Instructions (Signed)
START Aspirin 81 mg one tab daily START Crestor 5 mg, one tab daily START CoQ10 200 mg, one tab daily (this maybe found OTC)  Labs needed in 2 months  Your physician has requested that you have an echocardiogram. Echocardiography is a painless test that uses sound waves to create images of your heart. It provides your doctor with information about the size and shape of your heart and how well your heart's chambers and valves are working. This procedure takes approximately one hour. There are no restrictions for this procedure.   Your physician recommends that you schedule a follow-up appointment in: 12 months with Dr Aundra Dubin and repeat echo  At the [facility], you and your health needs are our priority. As part of our continuing mission to provide you with exceptional heart care, we have created designated Provider Care Teams. These Care Teams include your primary Cardiologist (physician) and Advanced Practice Providers (APPs- Physician Assistants and Nurse Practitioners) who all work together to provide you with the care you need, when you need it.     You may see any of the following providers on your designated Care Team at your next follow up: Marland Kitchen Dr Glori Bickers . Dr Loralie Champagne . Dr Vickki Muff . Darrick Grinder, NP . Lyda Jester, [place] . Audry Riles, PharmD   Please be sure to bring in all your medications bottles to every appointment.

## 2020-08-02 ENCOUNTER — Ambulatory Visit: Payer: Medicare Other

## 2020-08-02 DIAGNOSIS — R41841 Cognitive communication deficit: Secondary | ICD-10-CM | POA: Diagnosis not present

## 2020-08-02 DIAGNOSIS — R471 Dysarthria and anarthria: Secondary | ICD-10-CM | POA: Diagnosis not present

## 2020-08-02 NOTE — Patient Instructions (Signed)
   Talk for about 30-40 seconds on the topics I provided. You can talk longer than 30-40 seconds on one topic, I just want you to pause and regroup after that period of time. Focus on louder and CLEARER speech - over-enunciate the words Do this homework for 20-25 minutes/day. This will habitualize your slower speech to compensate for your "mumbling".

## 2020-08-02 NOTE — Therapy (Signed)
[facility] [address] [place], [name], [address] Phone: [phone]   Fax:  [phone]  Speech Language Pathology Treatment  Patient Details  Name: Ronald Noble MRN: [id number] Date of Birth: 05-25-1945 Referring Provider (SLP): Star Age, MD   Encounter Date: 08/02/2020   End of Session - 08/02/20 1612    Visit Number 11    Number of Visits 17    Date for SLP Re-Evaluation 08/15/20    SLP Start Time 1452   5 minutes late   SLP Stop Time  93    SLP Time Calculation (min) 38 min    Activity Tolerance Patient tolerated treatment well           Past Medical History:  Diagnosis Date  . BPH (benign prostatic hyperplasia)   . COVID 04/24/2020   scratchy throat x 1 day all symptoms resolved  . Parkinson disease ([place])   . Skin cancer of face    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  . Sleep apnea    mild no cpap used  . Urethral stricture     Past Surgical History:  Procedure Laterality Date  . BURR HOLE W/ STEREOTACTIC INSERTION OF DBS LEADS / INTRAOP MICROELECTRODE RECORDING  04/2014   [facility] in [facility]  . COLONOSCOPY  2019  . CYSTOSCOPY WITH URETHRAL DILATATION N/A 06/15/2019   Procedure: CYSTOSCOPY WITH URETHRAL DILATATION;  Surgeon: Franchot Gallo, MD;  Location: [facility];  Service: Urology;  Laterality: N/A;  30 MINS  . CYSTOSCOPY WITH URETHRAL DILATATION N/A 05/09/2020   Procedure: CYSTOSCOPY WITH BALLOON DILATATION OF POSTERIOR URETHRAL STRICTURE, FLUOROSCOPIC INTERPRETATION;  Surgeon: Franchot Gallo, MD;  Location: [facility];  Service: Urology;  Laterality: N/A;  . KNEE SURGERY  yrs ago   exploratory/left  . SKIN CANCER EXCISION  01/2014   face  . TRANSURETHRAL RESECTION OF PROSTATE     2008    There were no vitals filed for this visit.   Subjective Assessment - 08/02/20 1455    Subjective "Sorry I had a  meeting that lasted until 2:40."    Currently in Pain? No/denies                 ADULT SLP TREATMENT - 08/02/20 1455      General Information   Behavior/Cognition Alert;Cooperative;Pleasant mood      Treatment Provided   Treatment provided Cognitive-Linquistic      Cognitive-Linquistic Treatment   Treatment focused on Dysarthria;Patient/family/caregiver education    Skilled Treatment SLP targeted incr'd speech loudness and clarity today in conversation by having pt complete loud /a/, with low-mid 80s dB average. SLP targeted reduced rushing of speech and had pt read paragraphs out loud. As task progressed pt production improved due to pt "thinking about each word." SLP then moved into monologue of 30-40 seconds - pt with notable challenges with message content vs. maintaining loudness and articulation appropriate for WNL speech clarity/loudness. SLP had pt practice with this task at home for approx 20-25 minutes/day.      Assessment / Recommendations / Plan   Plan Continue with current plan of care      Progression Toward Goals   Progression toward goals Progressing toward goals            SLP Education - 08/02/20 1611    Education Details compensations for rushed speech, practice procedure (see pt instructions)    Person(s) Educated Patient  Methods Explanation;Handout    Comprehension Verbalized understanding            SLP Short Term Goals - 07/23/20 0855      SLP SHORT TERM GOAL #1   Title Pt will  maintain average low-mid 90sdB with loud /a/ for 4 sessions with rare min A for loudness    Baseline 06-28-20    Status Partially Met      SLP SHORT TERM GOAL #2   Title Pt will average low-mid 70sdB on structured speech tasks with rare min A for three sessions    Baseline 07-01-20, 07-04-20, 07-08-20    Status Achieved      SLP SHORT TERM GOAL #3   Title Pt will maintain average low 70s dB over 12 minute simple conversation with rare min A, for 2 sessions     Baseline 07-04-20    Status Achieved      SLP SHORT TERM GOAL #4   Title pt will maintain speech intelligibility at 95%+ using intelligibility compensations(due to rushed speech) for 8 minutes simple conversation in three sesssions    Baseline 07-04-20    Time 1    Status Partially Met            SLP Long Term Goals - 08/02/20 1612      SLP LONG TERM GOAL #1   Title Pt will maintain low 70sdB over 10 minute mod complex conversation with rare min A for loudness in three therapy sessions    Baseline 07-23-20, 07-24-20    Time 3    Period Weeks   or 17 sessions   Status On-going      SLP LONG TERM GOAL #2   Title Pt will achieve 95%+ speech intelligibility outside of tx room (due to rushing speech) over 12 minutes mod complex conversation with modified independence (external cues) in three sessions    Time 3    Period Weeks    Status On-going      SLP LONG TERM GOAL #3   Title pt will maintain average low-mod 90s dB with loud /a/ in 7 total sessions    Baseline 07-24-20, 07-29-20    Time 3    Period Weeks    Status On-going      SLP LONG TERM GOAL #4   Title using speakerphone pt will demo articulation in a 5 minute conversation adequate for no more than 1 listener request for pt repeat    Time 3    Period Weeks    Status On-going            Plan - 08/02/20 1612    Clinical Impression Statement Ronald Noble presents today with mild dysarthria c/b rare fluctuations of loudness in conversation and occasional imprecise articulation. Pt reports occasional "rushes" of speech particularly during dictation at work. Overall good conversational loudness exhibited during paragraph level reading tasks and in conversation this session with occasional fading to rare min A. Auditory feedback was effective in increasing pt's awareness of rushed speech and decreased use of abdominal breathing. SLP recommends continuation of skilled ST to maximize communication effectiveness and improve  QOL.    Speech Therapy Frequency 2x / week    Duration 8 weeks   or 17 total visits   Treatment/Interventions Compensatory techniques;Functional tasks;Cueing hierarchy;SLP instruction and feedback;Patient/family education;Internal/external aids    Potential to Achieve Goals Good    Potential Considerations Cooperation/participation level;Ability to learn/carryover information    SLP Home Exercise Plan provided      Consulted and Agree with Plan of Care Patient           Patient will benefit from skilled therapeutic intervention in order to improve the following deficits and impairments:   Dysarthria and anarthria  Cognitive communication deficit    Problem List Patient Active Problem List   Diagnosis Date Noted  . Bilateral olecranon bursitis 07/05/2019  . Other bursitis of knee, left knee 12/06/2018  . Pain in right leg 12/06/2018  . Parkinson disease ([facility])     SCHINKE,CARL ,MS, CCC-SLP  08/02/2020, 4:13 PM  [facility] [address] [place], [place], [address] Phone: [phone]   Fax:  [phone]   Name: Ronald Noble MRN: [id number] Date of Birth: 10/24/1945 

## 2020-08-05 ENCOUNTER — Ambulatory Visit: Payer: Medicare Other

## 2020-08-06 ENCOUNTER — Ambulatory Visit: Payer: Medicare Other | Attending: Orthopaedic Surgery

## 2020-08-06 ENCOUNTER — Other Ambulatory Visit: Payer: Self-pay

## 2020-08-06 DIAGNOSIS — R471 Dysarthria and anarthria: Secondary | ICD-10-CM | POA: Diagnosis not present

## 2020-08-06 DIAGNOSIS — R41841 Cognitive communication deficit: Secondary | ICD-10-CM | POA: Diagnosis not present

## 2020-08-06 NOTE — Therapy (Signed)
[facility] [address] [place], [name], [address] Phone: [phone]   Fax:  [phone]  Speech Language Pathology Treatment  Patient Details  Name: Ronald Noble MRN: [id number] Date of Birth: 01-01-1946 Referring Provider (SLP): Star Age, MD   Encounter Date: 08/06/2020   End of Session - 08/06/20 1512    Visit Number 13    Number of Visits 17    Date for SLP Re-Evaluation 08/15/20    SLP Start Time 1450    SLP Stop Time  1530    SLP Time Calculation (min) 40 min    Activity Tolerance Patient tolerated treatment well           Past Medical History:  Diagnosis Date  . BPH (benign prostatic hyperplasia)   . COVID 04/24/2020   scratchy throat x 1 day all symptoms resolved  . Parkinson disease ([place])   . Skin cancer of face    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  . Sleep apnea    mild no cpap used  . Urethral stricture     Past Surgical History:  Procedure Laterality Date  . BURR HOLE W/ STEREOTACTIC INSERTION OF DBS LEADS / INTRAOP MICROELECTRODE RECORDING  04/2014   [facility] in [facility]  . COLONOSCOPY  2019  . CYSTOSCOPY WITH URETHRAL DILATATION N/A 06/15/2019   Procedure: CYSTOSCOPY WITH URETHRAL DILATATION;  Surgeon: Franchot Gallo, MD;  Location: [facility];  Service: Urology;  Laterality: N/A;  30 MINS  . CYSTOSCOPY WITH URETHRAL DILATATION N/A 05/09/2020   Procedure: CYSTOSCOPY WITH BALLOON DILATATION OF POSTERIOR URETHRAL STRICTURE, FLUOROSCOPIC INTERPRETATION;  Surgeon: Franchot Gallo, MD;  Location: [facility];  Service: Urology;  Laterality: N/A;  . KNEE SURGERY  yrs ago   exploratory/left  . SKIN CANCER EXCISION  01/2014   face  . TRANSURETHRAL RESECTION OF PROSTATE     2008    There were no vitals filed for this visit.   Subjective Assessment - 08/06/20 1456    Subjective "Second time's a charm." (pt came at  the wrong time yesterday for appointment yesterday)    Currently in Pain? No/denies                 ADULT SLP TREATMENT - 08/06/20 1456      General Information   Behavior/Cognition Alert;Cooperative;Pleasant mood      Treatment Provided   Treatment provided Cognitive-Linquistic      Cognitive-Linquistic Treatment   Treatment focused on Dysarthria;Patient/family/caregiver education    Skilled Treatment Pt did not complete homework - SLP reiterated pt must complete the homework, apart from mere conversation, as pt will lose focus in conversation outside ST throughout the day. SLP targeted incr'd speech loudness and clarity today in conversation by having pt complete loud /a/, with low-mid 80s dB average. SLP targeted reduced rushing of speech and had pt produce monologues of 30-40 seconds - pt with notable challenges with message content vs. maintaining loudness and articulation appropriate for WNL speech clarity/loudness in 60% of these monologues despite SLP mod cues. Noted pt with reduced awareness of gait speed outside as he walked in front of SLP for 75% of outside portion of ST today (8  minutes).      Assessment / Recommendations / Plan   Plan Continue with current plan of care      Progression Toward Goals   Progression toward goals Not progressing toward goals (comment)   pt does  not always complete homework as directed           SLP Education - 08/06/20 2346    Education Details importance of completing homework as directed    Person(s) Educated Patient    Methods Explanation    Comprehension Verbalized understanding            SLP Short Term Goals - 07/23/20 0855      SLP SHORT TERM GOAL #1   Title Pt will  maintain average low-mid 90sdB with loud /a/ for 4 sessions with rare min A for loudness    Baseline 06-28-20    Status Partially Met      SLP SHORT TERM GOAL #2   Title Pt will average low-mid 70sdB on structured speech tasks with rare min A for three  sessions    Baseline 07-01-20, 07-04-20, 07-08-20    Status Achieved      SLP SHORT TERM GOAL #3   Title Pt will maintain average low 70s dB over 12 minute simple conversation with rare min A, for 2 sessions    Baseline 07-04-20    Status Achieved      SLP SHORT TERM GOAL #4   Title pt will maintain speech intelligibility at 95%+ using intelligibility compensations(due to rushed speech) for 8 minutes simple conversation in three sesssions    Baseline 07-04-20    Time 1    Status Partially Met            SLP Long Term Goals - 08/06/20 2349      SLP LONG TERM GOAL #1   Title Pt will maintain low 70sdB over 10 minute mod complex conversation with rare min A for loudness in three therapy sessions    Baseline 07-23-20, 07-24-20    Time 2    Period --   or 17 sessions   Status On-going      SLP LONG TERM GOAL #2   Title Pt will achieve 95%+ speech intelligibility outside of tx room (due to rushing speech) over 12 minutes mod complex conversation with modified independence (external cues) in three sessions    Time 2    Period Weeks    Status On-going      SLP LONG TERM GOAL #3   Title pt will maintain average low-mod 90s dB with loud /a/ in 7 total sessions    Baseline 07-24-20, 07-29-20    Time 2    Period Weeks    Status On-going      SLP LONG TERM GOAL #4   Title using speakerphone pt will demo articulation in a 5 minute conversation adequate for no more than 1 listener request for pt repeat    Time 2    Period Weeks    Status On-going            Plan - 08/06/20 2347    Clinical Impression Statement Ronald Noble presents today with mild-moderate dysarthria c/b usual imprecise articulation/rushes of speech. Overall good conversational loudness exhibited during conversation this session with rare min A. SLP recommends continuation of skilled ST to maximize communication effectiveness and improve QOL. SLP questions pt's overall progress with loudness and speech rate if  homework is not completed as directed.    Speech Therapy Frequency 2x / week    Duration 8 weeks   or 17 total visits   Treatment/Interventions Compensatory techniques;Functional tasks;Cueing hierarchy;SLP instruction and feedback;Patient/family education;Internal/external aids    Potential to Achieve Goals Good    Potential Considerations  Cooperation/participation level;Ability to learn/carryover information    SLP Home Exercise Plan provided    Consulted and Agree with Plan of Care Patient           Patient will benefit from skilled therapeutic intervention in order to improve the following deficits and impairments:   Dysarthria and anarthria  Cognitive communication deficit    Problem List Patient Active Problem List   Diagnosis Date Noted  . Bilateral olecranon bursitis 07/05/2019  . Other bursitis of knee, left knee 12/06/2018  . Pain in right leg 12/06/2018  . Parkinson disease ([place])     [place] ,[place], [place]  08/06/2020, 11:50 PM  [facility] [address] [place], [name], [address] Phone: [phone]   Fax:  [phone]   Name: Ronald Noble MRN: [id number] Date of Birth: 1946/01/07

## 2020-08-08 ENCOUNTER — Ambulatory Visit: Payer: Medicare Other

## 2020-08-08 ENCOUNTER — Other Ambulatory Visit: Payer: Self-pay

## 2020-08-08 DIAGNOSIS — R471 Dysarthria and anarthria: Secondary | ICD-10-CM

## 2020-08-08 DIAGNOSIS — R41841 Cognitive communication deficit: Secondary | ICD-10-CM | POA: Diagnosis not present

## 2020-08-08 NOTE — Therapy (Signed)
[facility] [address] [place], [name], [address] Phone: [phone]   Fax:  [phone]  Speech Language Pathology Treatment  Patient Details  Name: Ronald Noble MRN: [id number] Date of Birth: 02-26-1946 Referring Provider (SLP): Star Age, MD   Encounter Date: 08/08/2020   End of Session - 08/08/20 1625    Visit Number 14    Number of Visits 17    Date for SLP Re-Evaluation 08/15/20    SLP Start Time 0350    SLP Stop Time  1700    SLP Time Calculation (min) 43 min    Activity Tolerance Patient tolerated treatment well           Past Medical History:  Diagnosis Date  . BPH (benign prostatic hyperplasia)   . COVID 04/24/2020   scratchy throat x 1 day all symptoms resolved  . Parkinson disease ([facility])   . Skin cancer of face    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  . Sleep apnea    mild no cpap used  . Urethral stricture     Past Surgical History:  Procedure Laterality Date  . BURR HOLE W/ STEREOTACTIC INSERTION OF DBS LEADS / INTRAOP MICROELECTRODE RECORDING  04/2014   [facility], [facility] in [facility]  . COLONOSCOPY  2019  . CYSTOSCOPY WITH URETHRAL DILATATION N/A 06/15/2019   Procedure: CYSTOSCOPY WITH URETHRAL DILATATION;  Surgeon: Franchot Gallo, MD;  Location: [facility];  Service: Urology;  Laterality: N/A;  30 MINS  . CYSTOSCOPY WITH URETHRAL DILATATION N/A 05/09/2020   Procedure: CYSTOSCOPY WITH BALLOON DILATATION OF POSTERIOR URETHRAL STRICTURE, FLUOROSCOPIC INTERPRETATION;  Surgeon: Franchot Gallo, MD;  Location: [facility];  Service: Urology;  Laterality: N/A;  . KNEE SURGERY  yrs ago   exploratory/left  . SKIN CANCER EXCISION  01/2014   face  . TRANSURETHRAL RESECTION OF PROSTATE     2008    There were no vitals filed for this visit.   Subjective Assessment - 08/08/20 1618    Subjective "some ups and downs but overall pretty  good"    Currently in Pain? No/denies                 ADULT SLP TREATMENT - 08/08/20 1619      General Information   Behavior/Cognition Alert;Cooperative;Pleasant mood      Treatment Provided   Treatment provided Cognitive-Linquistic      Cognitive-Linquistic Treatment   Treatment focused on Dysarthria;Patient/family/caregiver education    Skilled Treatment Pt reviewed targeted tasks in last ST session, including need to articulate and breath otherwise speech will "run together." Pt indicated he has been doing homework "somewhat." SLP reviewed importance of HEP to optimize volume and clarity to carryover compensations and maximize communication effectiveness, in which pt verbalized understanding. Loud /a/ completed with average of low 90s DB.  Improved awareness noted for fast rate while reading. Occasional min A required to cue  abdominal breath, slow rate and over-enunnicate for various reading tasks. Increasing rushes in speech exhibited during article ~1/2 way, with increasing cues required. Occasional self-corrections for rushes in speech noted. Volume WNL for sentences and multi-paragraph article.      Assessment / Recommendations / Plan   Plan Continue with current plan of care      Progression Toward Goals   Progression toward goals Progressing toward goals            SLP Education - 08/08/20 1621  Education Details monitoring for rushes in speech, reading aloud    Person(s) Educated Patient    Methods Explanation;Demonstration;Handout    Comprehension Verbalized understanding;Returned demonstration;Need further instruction            SLP Short Term Goals - 07/23/20 0855      SLP SHORT TERM GOAL #1   Title Pt will  maintain average low-mid 90sdB with loud /a/ for 4 sessions with rare min A for loudness    Baseline 06-28-20    Status Partially Met      SLP SHORT TERM GOAL #2   Title Pt will average low-mid 70sdB on structured speech tasks with rare min A  for three sessions    Baseline 07-01-20, 07-04-20, 07-08-20    Status Achieved      SLP SHORT TERM GOAL #3   Title Pt will maintain average low 70s dB over 12 minute simple conversation with rare min A, for 2 sessions    Baseline 07-04-20    Status Achieved      SLP SHORT TERM GOAL #4   Title pt will maintain speech intelligibility at 95%+ using intelligibility compensations(due to rushed speech) for 8 minutes simple conversation in three sesssions    Baseline 07-04-20    Time 1    Status Partially Met            SLP Long Term Goals - 08/08/20 1625      SLP LONG TERM GOAL #1   Title Pt will maintain low 70sdB over 10 minute mod complex conversation with rare min A for loudness in three therapy sessions    Baseline 07-23-20, 07-24-20, 08-08-20    Time --    Period --   or 17 sessions   Status Achieved      SLP LONG TERM GOAL #2   Title Pt will achieve 95%+ speech intelligibility outside of tx room (due to rushing speech) over 12 minutes mod complex conversation with modified independence (external cues) in three sessions    Time 2    Period Weeks    Status On-going      SLP LONG TERM GOAL #3   Title pt will maintain average low-mod 90s dB with loud /a/ in 7 total sessions    Baseline 07-24-20, 07-29-20, 08-08-20    Time 2    Period Weeks    Status On-going      SLP LONG TERM GOAL #4   Title using speakerphone pt will demo articulation in a 5 minute conversation adequate for no more than 1 listener request for pt repeat    Time 2    Period Weeks    Status On-going            Plan - 08/08/20 1637    Clinical Impression Statement Ronald Noble presents today with mild-moderate dysarthria c/b occasional imprecise articulation/rushes of speech. Occasional min A required to slow rate of speech and over-ennunicate to reduce rushes in speech. Overall good conversational loudness exhibited during reading tasks and in conversation this session with rare min A. SLP re-educated patient  on importance of completing HEP to optimize carryover of compensation to increase communication effectiveness. SLP recommends continuation of skilled ST to maximize communication effectiveness and improve QOL. SLP questions pt's overall progress with loudness and speech rate if homework is not completed as directed.    Speech Therapy Frequency 2x / week    Duration 8 weeks   or 17 total visits   Treatment/Interventions Compensatory techniques;Functional tasks;Cueing  hierarchy;SLP instruction and feedback;Patient/family education;Internal/external aids    Potential to Achieve Goals Fair    Potential Considerations Cooperation/participation level;Ability to learn/carryover information    SLP Home Exercise Plan provided    Consulted and Agree with Plan of Care Patient           Patient will benefit from skilled therapeutic intervention in order to improve the following deficits and impairments:   Dysarthria and anarthria  Cognitive communication deficit    Problem List Patient Active Problem List   Diagnosis Date Noted  . Bilateral olecranon bursitis 07/05/2019  . Other bursitis of knee, left knee 12/06/2018  . Pain in right leg 12/06/2018  . Parkinson disease ([place])     Alinda Deem, MA CCC-SLP 08/08/2020, 4:59 PM  [facility] [address] [place], [name], [address] Phone: [phone]   Fax:  [phone]   Name: Ronald Noble MRN: [id number] Date of Birth: July 04, 1945

## 2020-08-13 ENCOUNTER — Ambulatory Visit: Payer: Medicare Other

## 2020-08-13 ENCOUNTER — Other Ambulatory Visit (HOSPITAL_COMMUNITY): Payer: Self-pay | Admitting: Cardiology

## 2020-08-13 ENCOUNTER — Other Ambulatory Visit: Payer: Self-pay

## 2020-08-13 DIAGNOSIS — R471 Dysarthria and anarthria: Secondary | ICD-10-CM | POA: Diagnosis not present

## 2020-08-13 DIAGNOSIS — R41841 Cognitive communication deficit: Secondary | ICD-10-CM | POA: Diagnosis not present

## 2020-08-13 NOTE — Therapy (Signed)
[facility] [address] [place], [place], [address] Phone: [phone]   Fax:  [phone]  Speech Language Pathology Treatment/Renewal Summary  Patient Details  Name: Ronald Noble MRN: [id number] Date of Birth: 02-15-1946 Referring Provider (SLP): Huston Foley, MD   Encounter Date: 08/13/2020   End of Session - 08/13/20 1652    Visit Number 15    Number of Visits 17    Date for SLP Re-Evaluation 09/02/20    SLP Start Time 1450    SLP Stop Time  1530    SLP Time Calculation (min) 40 min    Activity Tolerance Patient tolerated treatment well           Past Medical History:  Diagnosis Date  . BPH (benign prostatic hyperplasia)   . COVID 04/24/2020   scratchy throat x 1 day all symptoms resolved  . Parkinson disease ([facility])   . Skin cancer of face    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  . Sleep apnea    mild no cpap used  . Urethral stricture     Past Surgical History:  Procedure Laterality Date  . BURR HOLE W/ STEREOTACTIC INSERTION OF DBS LEADS / INTRAOP MICROELECTRODE RECORDING  04/2014   [facility], [facility] in [facility]  . COLONOSCOPY  2019  . CYSTOSCOPY WITH URETHRAL DILATATION N/A 06/15/2019   Procedure: CYSTOSCOPY WITH URETHRAL DILATATION;  Surgeon: Marcine Matar, MD;  Location: [facility];  Service: Urology;  Laterality: N/A;  30 MINS  . CYSTOSCOPY WITH URETHRAL DILATATION N/A 05/09/2020   Procedure: CYSTOSCOPY WITH BALLOON DILATATION OF POSTERIOR URETHRAL STRICTURE, FLUOROSCOPIC INTERPRETATION;  Surgeon: Marcine Matar, MD;  Location: [facility];  Service: Urology;  Laterality: N/A;  . KNEE SURGERY  yrs ago   exploratory/left  . SKIN CANCER EXCISION  01/2014   face  . TRANSURETHRAL RESECTION OF PROSTATE     2008    There were no vitals filed for this visit.          ADULT SLP TREATMENT - 08/13/20 1503      General  Information   Behavior/Cognition Alert;Cooperative;Pleasant mood      Treatment Provided   Treatment provided Cognitive-Linquistic      Cognitive-Linquistic Treatment   Treatment focused on Dysarthria;Patient/family/caregiver education    Skilled Treatment "Been doing it in the car." (pt's loud /a/). Not adhering to frequency or scope - as pt stated he would sometimes to 10 reps once a day; "I'm averaging 5 a day probably." SLP reiterated that pt could do up to 7 reps at once, but really needed to do BID due to muscle memory. Averaged mid-upper 80s dB. Pt's conversation (mod-complex/complex) for 15 minutes with WNL volume. In a ~12-minute phone conversation with pt using his speakerphone SLP misunderstood pt once, outside of therapy room.      Assessment / Recommendations / Plan   Plan Continue with current plan of care      Progression Toward Goals   Progression toward goals Progressing toward goals            SLP Education - 08/13/20 1652    Education Details pt should complete home program like directed to by SLP, rationale for home program reps/frequency    Person(s) Educated Patient    Methods Explanation    Comprehension Verbalized understanding;Need further instruction            SLP Short Term Goals - 07/23/20 8591  SLP SHORT TERM GOAL #1   Title Pt will  maintain average low-mid 90sdB with loud /a/ for 4 sessions with rare min A for loudness    Baseline 06-28-20    Status Partially Met      SLP SHORT TERM GOAL #2   Title Pt will average low-mid 70sdB on structured speech tasks with rare min A for three sessions    Baseline 07-01-20, 07-04-20, 07-08-20    Status Achieved      SLP SHORT TERM GOAL #3   Title Pt will maintain average low 70s dB over 12 minute simple conversation with rare min A, for 2 sessions    Baseline 07-04-20    Status Achieved      SLP SHORT TERM GOAL #4   Title pt will maintain speech intelligibility at 95%+ using intelligibility  compensations(due to rushed speech) for 8 minutes simple conversation in three sesssions    Baseline 07-04-20    Time 1    Status Partially Met            SLP Long Term Goals - 08/13/20 1532      SLP LONG TERM GOAL #1   Title Pt will maintain low 70sdB over 10 minute mod complex conversation with rare min A for loudness in three therapy sessions    Baseline 07-23-20, 07-24-20, 08-08-20    Period --   or 17 sessions   Status Achieved      SLP LONG TERM GOAL #2   Title Pt will achieve 95%+ speech intelligibility outside of tx room (due to rushing speech) over 12 minutes mod complex conversation with modified independence (external cues) in three sessions    Baseline 08-13-20    Time 1    Period Weeks    Status On-going      SLP LONG TERM GOAL #3   Title pt will maintain average low-mod 90s dB with loud /a/ in 7 total sessions    Baseline 07-24-20, 07-29-20, 08-08-20    Time 1    Period Weeks    Status On-going      SLP LONG TERM GOAL #4   Title using speakerphone pt will demo articulation in a 5 minute conversation adequate for no more than 1 listener request for pt repeat    Status Achieved            Plan - 08/13/20 1653    Clinical Impression Statement Ronald Noble presents today with mild-moderate dysarthria c/b occasional imprecise articulation/rushes of speech. Overall good conversational loudness and speech rate exhibited during conversation today. SLP, like last session, re-educated patient on importance of completing HEP as directed to optimize carryover of compensation to increase communication effectiveness. SLP recommends continuation of skilled ST for two more sessions to maximize communication effectiveness and improve QOL. SLP questions pt's overall progress with loudness and speech rate if homework is not completed as directed.    Speech Therapy Frequency 2x / week    Duration 8 weeks   or 17 total visits   Treatment/Interventions Compensatory  techniques;Functional tasks;Cueing hierarchy;SLP instruction and feedback;Patient/family education;Internal/external aids    Potential to Achieve Goals Fair    Potential Considerations Cooperation/participation level;Ability to learn/carryover information    SLP Home Exercise Plan provided    Consulted and Agree with Plan of Care Patient           Patient will benefit from skilled therapeutic intervention in order to improve the following deficits and impairments:   Dysarthria and anarthria  Cognitive communication deficit    Problem List Patient Active Problem List   Diagnosis Date Noted  . Bilateral olecranon bursitis 07/05/2019  . Other bursitis of knee, left knee 12/06/2018  . Pain in right leg 12/06/2018  . Parkinson disease ([place])     [place] ,[place], [place]  08/13/2020, 4:54 PM  [facility] [address] [place], [name], [address] Phone: [phone]   Fax:  [phone]   Name: Ronald Noble MRN: [id number] Date of Birth: 1945-06-12

## 2020-08-14 ENCOUNTER — Other Ambulatory Visit: Payer: Self-pay | Admitting: Neurology

## 2020-08-14 DIAGNOSIS — G479 Sleep disorder, unspecified: Secondary | ICD-10-CM

## 2020-08-14 DIAGNOSIS — G4752 REM sleep behavior disorder: Secondary | ICD-10-CM

## 2020-08-15 ENCOUNTER — Other Ambulatory Visit: Payer: Self-pay

## 2020-08-15 ENCOUNTER — Ambulatory Visit: Payer: Medicare Other

## 2020-08-15 DIAGNOSIS — R41841 Cognitive communication deficit: Secondary | ICD-10-CM | POA: Diagnosis not present

## 2020-08-15 DIAGNOSIS — R471 Dysarthria and anarthria: Secondary | ICD-10-CM | POA: Diagnosis not present

## 2020-08-15 NOTE — Therapy (Signed)
[facility] [address] [place], [name], [address] Phone: [phone]   Fax:  [phone]  Speech Language Pathology Treatment  Patient Details  Name: Ronald Noble MRN: [id number] Date of Birth: 27-Aug-1945 Referring Provider (SLP): Star Age, MD   Encounter Date: 08/15/2020   End of Session - 08/15/20 1621    Visit Number 16    Number of Visits 17    Date for SLP Re-Evaluation 09/02/20    SLP Start Time 1620   pt arrived 5 mins late   SLP Stop Time  1700    SLP Time Calculation (min) 40 min    Activity Tolerance Patient tolerated treatment well           Past Medical History:  Diagnosis Date  . BPH (benign prostatic hyperplasia)   . COVID 04/24/2020   scratchy throat x 1 day all symptoms resolved  . Parkinson disease ([place])   . Skin cancer of face    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  . Sleep apnea    mild no cpap used  . Urethral stricture     Past Surgical History:  Procedure Laterality Date  . BURR HOLE W/ STEREOTACTIC INSERTION OF DBS LEADS / INTRAOP MICROELECTRODE RECORDING  04/2014   [facility] in [facility]  . COLONOSCOPY  2019  . CYSTOSCOPY WITH URETHRAL DILATATION N/A 06/15/2019   Procedure: CYSTOSCOPY WITH URETHRAL DILATATION;  Surgeon: Franchot Gallo, MD;  Location: [facility];  Service: Urology;  Laterality: N/A;  30 MINS  . CYSTOSCOPY WITH URETHRAL DILATATION N/A 05/09/2020   Procedure: CYSTOSCOPY WITH BALLOON DILATATION OF POSTERIOR URETHRAL STRICTURE, FLUOROSCOPIC INTERPRETATION;  Surgeon: Franchot Gallo, MD;  Location: [facility];  Service: Urology;  Laterality: N/A;  . KNEE SURGERY  yrs ago   exploratory/left  . SKIN CANCER EXCISION  01/2014   face  . TRANSURETHRAL RESECTION OF PROSTATE     2008    There were no vitals filed for this visit.   Subjective Assessment - 08/15/20 1631    Subjective "been doing  okay. haven't slept well the past two nights"    Currently in Pain? No/denies                 ADULT SLP TREATMENT - 08/15/20 1624      General Information   Behavior/Cognition Alert;Cooperative;Pleasant mood      Treatment Provided   Treatment provided Cognitive-Linquistic      Cognitive-Linquistic Treatment   Treatment focused on Dysarthria;Patient/family/caregiver education    Skilled Treatment SLP re-educated patient on importance of completing HEP with clarification of recent instructions of completing loud /a/ for 7-10 repetitions twice a day due to confusion. Loud /a/ conducted this session, which increased from mid 80s to low 90s db. Pt reported fatigue this session, which seemingly impacted consistency and carryover of compensations. Occasional visual cues required to slow rate and increase volume in unstructured conversation. Despite fatigue today, pt reported "volume is better" and aware of tendency to "speed in conversation."      Assessment / Recommendations / Plan   Plan Continue with current plan of care      Progression Toward Goals   Progression toward goals Progressing toward goals            SLP Education - 08/15/20 1646    Education Details review of HEP, visual cues    Person(s) Educated Patient    Methods Explanation;Demonstration  Comprehension Verbalized understanding;Returned demonstration;Need further instruction            SLP Short Term Goals - 07/23/20 0855      SLP SHORT TERM GOAL #1   Title Pt will  maintain average low-mid 90sdB with loud /a/ for 4 sessions with rare min A for loudness    Baseline 06-28-20    Status Partially Met      SLP SHORT TERM GOAL #2   Title Pt will average low-mid 70sdB on structured speech tasks with rare min A for three sessions    Baseline 07-01-20, 07-04-20, 07-08-20    Status Achieved      SLP SHORT TERM GOAL #3   Title Pt will maintain average low 70s dB over 12 minute simple conversation with rare min  A, for 2 sessions    Baseline 07-04-20    Status Achieved      SLP SHORT TERM GOAL #4   Title pt will maintain speech intelligibility at 95%+ using intelligibility compensations(due to rushed speech) for 8 minutes simple conversation in three sesssions    Baseline 07-04-20    Time 1    Status Partially Met            SLP Long Term Goals - 08/15/20 1622      SLP LONG TERM GOAL #1   Title Pt will maintain low 70sdB over 10 minute mod complex conversation with rare min A for loudness in three therapy sessions    Baseline 07-23-20, 07-24-20, 08-08-20    Period --   or 17 sessions   Status Achieved      SLP LONG TERM GOAL #2   Title Pt will achieve 95%+ speech intelligibility outside of tx room (due to rushing speech) over 12 minutes mod complex conversation with modified independence (external cues) in three sessions    Baseline 08-13-20    Time 1    Period Weeks    Status On-going      SLP LONG TERM GOAL #3   Title pt will maintain average low-mod 90s dB with loud /a/ in 7 total sessions    Baseline 07-24-20, 07-29-20, 08-08-20    Time 1    Period Weeks    Status On-going      SLP LONG TERM GOAL #4   Title using speakerphone pt will demo articulation in a 5 minute conversation adequate for no more than 1 listener request for pt repeat    Status Achieved            Plan - 08/15/20 1714    Clinical Impression Statement [name] presents today with mild-moderate dysarthria c/b occasional imprecise articulation/rushes of speech. Reduced conversational loudness and increased speech rate exhibited during conversation today, seemingly related to fatigue. SLP again re-educated patient on importance of completing HEP as directed to optimize carryover of compensation to increase communication effectiveness. SLP questions pt's overall progress with loudness and speech rate if homework is not completed as directed.    Speech Therapy Frequency 2x / week    Duration 8 weeks   or 17  total visits   Treatment/Interventions Compensatory techniques;Functional tasks;Cueing hierarchy;SLP instruction and feedback;Patient/family education;Internal/external aids    Potential to Achieve Goals Fair    Potential Considerations Cooperation/participation level;Ability to learn/carryover information    SLP Home Exercise Plan provided    Consulted and Agree with Plan of Care Patient           Patient will benefit from skilled therapeutic intervention in  order to improve the following deficits and impairments:   Dysarthria and anarthria    Problem List Patient Active Problem List   Diagnosis Date Noted  . Bilateral olecranon bursitis 07/05/2019  . Other bursitis of knee, left knee 12/06/2018  . Pain in right leg 12/06/2018  . Parkinson disease ([place])     Alinda Deem, MA CCC-SLP 08/15/2020, 5:15 PM  [facility] [address] [place], [name], [address] Phone: [phone]   Fax:  [phone]   Name: Alyx Gee MRN: [id number] Date of Birth: [birth year]

## 2020-08-15 NOTE — Telephone Encounter (Signed)
Pt requesting a refill of his Clonazepam. Per [place] [facility] registry, last refill was 05/25/20 # 60 for a 30 day supply. Pt has been compliant with f/u's and is due for refill.

## 2020-08-18 DIAGNOSIS — Z23 Encounter for immunization: Secondary | ICD-10-CM | POA: Diagnosis not present

## 2020-08-20 ENCOUNTER — Ambulatory Visit: Payer: Medicare Other

## 2020-08-20 ENCOUNTER — Other Ambulatory Visit: Payer: Self-pay

## 2020-08-20 DIAGNOSIS — R41841 Cognitive communication deficit: Secondary | ICD-10-CM | POA: Diagnosis not present

## 2020-08-20 DIAGNOSIS — R471 Dysarthria and anarthria: Secondary | ICD-10-CM | POA: Diagnosis not present

## 2020-08-20 NOTE — Therapy (Signed)
[facility] [address] [place], [name], [address] Phone: [phone]   Fax:  [phone]  Speech Language Pathology Treatment- Discharge Summary  Patient Details  Name: Ronald Noble MRN: [id number] Date of Birth: 10-Oct-1945 Referring Provider (SLP): Star Age, MD   Encounter Date: 08/20/2020   SPEECH THERAPY DISCHARGE SUMMARY  Visits from Start of Care: 17  Current functional level related to goals / functional outcomes: Ronald Noble presents with improvements in mild dysarthria secondary to Parkinson's disease. More consistent WNL volume exhibited in conversation with occasional rushes in speech noted. Pt able to intermittently ID and self-correct rushes in speech and fluctuations in volume with varying accuracy and intermittent need for verbal/visual cues. SLP team has educated and trained recommended HEP and dysarthria compensations to optimize volume and speech intelligibility. Inconsistent carryover and implementation of HEP reported, as pt does not always complete HEP as instructed despite re-education. Pt aware of SLP recommendations for continuation of HEP s/p ST discharge to maximize carryover and optimize communication effectiveness. Pt is pleased with current progress and agrees to ST discharge at this time as POC is complete.    Remaining deficits: Inconsistent carryover and implementation of recommended HEP    Education / Equipment: HEP, abdominal breathing, dysarthria compensations, functional practice opportunities Plan: Patient agrees to discharge.  Patient goals were partially met. Patient is being discharged due to being pleased with the current functional level.  ?????          End of Session - 08/20/20 1140    Visit Number 17    Number of Visits 17    Date for SLP Re-Evaluation 09/02/20    SLP Start Time 2010    SLP Stop Time  1225    SLP Time Calculation (min) 40 min    Activity  Tolerance Patient tolerated treatment well           Past Medical History:  Diagnosis Date  . BPH (benign prostatic hyperplasia)   . COVID 04/24/2020   scratchy throat x 1 day all symptoms resolved  . Parkinson disease ([place])   . Skin cancer of face    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  . Sleep apnea    mild no cpap used  . Urethral stricture     Past Surgical History:  Procedure Laterality Date  . BURR HOLE W/ STEREOTACTIC INSERTION OF DBS LEADS / INTRAOP MICROELECTRODE RECORDING  04/2014   [facility], new leads replaced in 2020  . COLONOSCOPY  2019  . CYSTOSCOPY WITH URETHRAL DILATATION N/A 06/15/2019   Procedure: CYSTOSCOPY WITH URETHRAL DILATATION;  Surgeon: Franchot Gallo, MD;  Location: [facility];  Service: Urology;  Laterality: N/A;  30 MINS  . CYSTOSCOPY WITH URETHRAL DILATATION N/A 05/09/2020   Procedure: CYSTOSCOPY WITH BALLOON DILATATION OF POSTERIOR URETHRAL STRICTURE, FLUOROSCOPIC INTERPRETATION;  Surgeon: Franchot Gallo, MD;  Location: [facility];  Service: Urology;  Laterality: N/A;  . KNEE SURGERY  yrs ago   exploratory/left  . SKIN CANCER EXCISION  01/2014   face  . TRANSURETHRAL RESECTION OF PROSTATE     2008    There were no vitals filed for this visit.   Subjective Assessment - 08/20/20 1144    Subjective "I'm playing Wordle"    Currently in Pain? No/denies                 ADULT SLP TREATMENT - 08/20/20 1137      General Information   Behavior/Cognition  Alert;Cooperative;Pleasant mood      Treatment Provided   Treatment provided Cognitive-Linquistic      Cognitive-Linquistic Treatment   Treatment focused on Dysarthria;Patient/family/caregiver education    Skilled Treatment SLP reviewed HEP, including loud /a/ x7-10 reps BID, functional reading, and conversational practice. Handout provided to aid carryover and comprehension. Pt verbalized understanding and agreement. Loud /a/ completed x10  with average of mid-high 80s db. In structured reading task, pt averaged low 70s db with rare min A to slow rate. In conversation, pt averaged high 60s to low 70s db with occasional cues required to increase volume and utilize AB. Occasional rushes in speech noted in conversation, in which pt able to intermittently ID and self-correct with occasional verbal cues. Discharge summary completed and reviewed with patient. Pt verbalized understanding and agreement with ST discharge as pt is pleased with current progress.      Assessment / Recommendations / Plan   Plan Discharge SLP treatment due to (comment)   ST program complete     Progression Toward Goals   Progression toward goals Goals partially met, education completed, patient discharged from [facility] - 08/20/20 1139    Education Details rec to complete HEP as instructed, reading aloud, conversational practice    Person(s) Educated Patient    Methods Explanation;Demonstration    Comprehension Verbalized understanding;Returned demonstration            SLP Short Term Goals - 07/23/20 0855      SLP SHORT TERM GOAL #1   Title Pt will  maintain average low-mid 90sdB with loud /a/ for 4 sessions with rare min A for loudness    Baseline 06-28-20    Status Partially Met      SLP SHORT TERM GOAL #2   Title Pt will average low-mid 70sdB on structured speech tasks with rare min A for three sessions    Baseline 07-01-20, 07-04-20, 07-08-20    Status Achieved      SLP SHORT TERM GOAL #3   Title Pt will maintain average low 70s dB over 12 minute simple conversation with rare min A, for 2 sessions    Baseline 07-04-20    Status Achieved      SLP SHORT TERM GOAL #4   Title pt will maintain speech intelligibility at 95%+ using intelligibility compensations(due to rushed speech) for 8 minutes simple conversation in three sesssions    Baseline 07-04-20    Time 1    Status Partially Met            SLP Long Term Goals -  08/20/20 1140      SLP LONG TERM GOAL #1   Title Pt will maintain low 70sdB over 10 minute mod complex conversation with rare min A for loudness in three therapy sessions    Baseline 07-23-20, 07-24-20, 08-08-20    Period --   or 17 sessions   Status Achieved      SLP LONG TERM GOAL #2   Title Pt will achieve 95%+ speech intelligibility outside of tx room (due to rushing speech) over 12 minutes mod complex conversation with modified independence (external cues) in three sessions    Baseline 08-13-20    Status Partially Met      SLP LONG TERM GOAL #3   Title pt will maintain average low-mod 90s dB with loud /a/ in 7 total sessions    Baseline 07-24-20, 07-29-20, 08-08-20    Status  Partially Met      SLP LONG TERM GOAL #4   Title using speakerphone pt will demo articulation in a 5 minute conversation adequate for no more than 1 listener request for pt repeat    Status Achieved            Plan - 08/20/20 1141    Clinical Impression Statement Ronald Noble presents with improvements in mild-moderate dysarthria c/b fluctuations in volume and occasional imprecise articulation/rushes of speech. Occasional reduced conversational loudness and more consistent speech rate exhibited during conversation today. SLP again, re-educated patient on importance of completing HEP as directed to optimize carryover of compensations to increase communication effectiveness s/p ST discharge. Pt verbalized understanding understanding and agreement with ST discharge as pt is pleased with current progress. Recommended f/u in 6 months for Parkinson's screen discussed.    Treatment/Interventions Compensatory techniques;Functional tasks;Cueing hierarchy;SLP instruction and feedback;Patient/family education;Internal/external aids    Potential to Achieve Goals Fair    Potential Considerations Cooperation/participation level;Ability to learn/carryover information    SLP Home Exercise Plan provided    Consulted and Agree  with Plan of Care Patient           Patient will benefit from skilled therapeutic intervention in order to improve the following deficits and impairments:   Dysarthria and anarthria    Problem List Patient Active Problem List   Diagnosis Date Noted  . Bilateral olecranon bursitis 07/05/2019  . Other bursitis of knee, left knee 12/06/2018  . Pain in right leg 12/06/2018  . Parkinson disease ([place])     Alinda Deem, MA CCC-SLP 08/20/2020, 12:26 PM  [facility] [address] [place], [name], [address] Phone: [phone]   Fax:  [phone]   Name: Ronald Noble MRN: [id number] Date of Birth: Mar 13, 1946

## 2020-08-20 NOTE — Patient Instructions (Signed)
  Home Exercise Program:  Loud "ah" for 7-10 repetitions twice a day. Remember: focus on your belly breath and ease into it to reduce strain  Practice reading aloud every day. Breath at punctuation and go slowly  Be mindful in conversation about your volume, rate, and breathing  Fatigue may impact your voice as day goes on. You may need to increase your effort as the day goes on

## 2020-08-30 ENCOUNTER — Ambulatory Visit (HOSPITAL_COMMUNITY)
Admission: RE | Admit: 2020-08-30 | Discharge: 2020-08-30 | Disposition: A | Discharge status: Home / Self Care | Payer: Medicare Other | Source: Ambulatory Visit | Attending: Cardiology | Admitting: Cardiology

## 2020-08-30 ENCOUNTER — Other Ambulatory Visit: Payer: Self-pay

## 2020-08-30 DIAGNOSIS — G20 Parkinson's disease: Secondary | ICD-10-CM | POA: Insufficient documentation

## 2020-08-30 DIAGNOSIS — I238 Other current complications following acute myocardial infarction: Secondary | ICD-10-CM | POA: Diagnosis not present

## 2020-08-30 DIAGNOSIS — I517 Cardiomegaly: Secondary | ICD-10-CM | POA: Diagnosis not present

## 2020-08-30 DIAGNOSIS — E785 Hyperlipidemia, unspecified: Secondary | ICD-10-CM | POA: Insufficient documentation

## 2020-08-30 DIAGNOSIS — I2510 Atherosclerotic heart disease of native coronary artery without angina pectoris: Secondary | ICD-10-CM | POA: Insufficient documentation

## 2020-08-30 LAB — ECHOCARDIOGRAM COMPLETE
AR max vel: 4.07 cm2
AV Area VTI: 3.64 cm2
AV Area mean vel: 3.43 cm2
AV Mean grad: 2 mmHg
AV Peak grad: 4.6 mmHg
Ao pk vel: 1.07 m/s
Area-P 1/2: 1.76 cm2
MV VTI: 2.68 cm2
S' Lateral: 2.6 cm

## 2020-08-30 NOTE — Progress Notes (Signed)
  Echocardiogram 2D Echocardiogram has been performed.  Ronald Noble 08/30/2020, 9:10 AM

## 2020-09-03 ENCOUNTER — Encounter (HOSPITAL_COMMUNITY): Payer: Self-pay

## 2020-09-25 DIAGNOSIS — Z9682 Presence of neurostimulator: Secondary | ICD-10-CM | POA: Diagnosis not present

## 2020-09-25 DIAGNOSIS — K117 Disturbances of salivary secretion: Secondary | ICD-10-CM | POA: Diagnosis not present

## 2020-09-25 DIAGNOSIS — Z9689 Presence of other specified functional implants: Secondary | ICD-10-CM | POA: Diagnosis not present

## 2020-09-25 DIAGNOSIS — G245 Blepharospasm: Secondary | ICD-10-CM | POA: Diagnosis not present

## 2020-09-25 DIAGNOSIS — G20 Parkinson's disease: Secondary | ICD-10-CM | POA: Diagnosis not present

## 2020-10-01 ENCOUNTER — Telehealth: Payer: Self-pay | Admitting: Neurology

## 2020-10-01 ENCOUNTER — Ambulatory Visit (HOSPITAL_COMMUNITY)
Admission: RE | Admit: 2020-10-01 | Discharge: 2020-10-01 | Disposition: A | Discharge status: Home / Self Care | Payer: Medicare Other | Source: Ambulatory Visit | Attending: Internal Medicine | Admitting: Internal Medicine

## 2020-10-01 ENCOUNTER — Encounter (HOSPITAL_COMMUNITY): Payer: Self-pay

## 2020-10-01 ENCOUNTER — Other Ambulatory Visit: Payer: Self-pay

## 2020-10-01 DIAGNOSIS — Z13220 Encounter for screening for lipoid disorders: Secondary | ICD-10-CM | POA: Insufficient documentation

## 2020-10-01 DIAGNOSIS — I238 Other current complications following acute myocardial infarction: Secondary | ICD-10-CM | POA: Diagnosis not present

## 2020-10-01 LAB — LIPID PANEL
Cholesterol: 107 mg/dL (ref 0–200)
HDL: 29 mg/dL — ABNORMAL LOW (ref >40)
LDL Cholesterol: 70 mg/dL (ref 0–99)
Total CHOL/HDL Ratio: 3.7 RATIO
Triglycerides: 42 mg/dL (ref <150)
VLDL: 8 mg/dL (ref 0–40)

## 2020-10-01 LAB — HEPATIC FUNCTION PANEL
ALT: 12 U/L (ref 0–44)
AST: 17 U/L (ref 15–41)
Albumin: 3.9 g/dL (ref 3.5–5.0)
Alkaline Phosphatase: 76 U/L (ref 38–126)
Bilirubin, Direct: 0.1 mg/dL (ref 0.0–0.2)
Indirect Bilirubin: 0.7 mg/dL (ref 0.3–0.9)
Total Bilirubin: 0.8 mg/dL (ref 0.3–1.2)
Total Protein: 6.8 g/dL (ref 6.5–8.1)

## 2020-10-01 NOTE — Telephone Encounter (Signed)
Pt is requesting a call back, says doctor did not complete the handicap placard form. Please advise

## 2020-10-01 NOTE — Telephone Encounter (Signed)
See mychart message from 10/01/20. 

## 2020-10-03 ENCOUNTER — Telehealth (HOSPITAL_COMMUNITY): Payer: Self-pay | Admitting: Surgery

## 2020-10-03 DIAGNOSIS — Z13220 Encounter for screening for lipoid disorders: Secondary | ICD-10-CM

## 2020-10-03 NOTE — Telephone Encounter (Signed)
Patient referred to Lipid clinic for initiation of Repatha per Dr. Aundra Dubin.

## 2020-10-06 DIAGNOSIS — Z20822 Contact with and (suspected) exposure to covid-19: Secondary | ICD-10-CM | POA: Diagnosis not present

## 2020-10-10 ENCOUNTER — Ambulatory Visit: Payer: Medicare Other

## 2020-10-10 ENCOUNTER — Ambulatory Visit (INDEPENDENT_AMBULATORY_CARE_PROVIDER_SITE_OTHER): Payer: Medicare Other | Admitting: Pharmacist Clinician (PhC)/ Clinical Pharmacy Specialist

## 2020-10-10 ENCOUNTER — Other Ambulatory Visit: Payer: Self-pay

## 2020-10-10 DIAGNOSIS — E785 Hyperlipidemia, unspecified: Secondary | ICD-10-CM | POA: Diagnosis not present

## 2020-10-10 MED ORDER — ATORVASTATIN CALCIUM 10 MG PO TABS: 10.0000 mg | ORAL_TABLET | Freq: Every day | ORAL | 3 refills | Status: AC

## 2020-10-10 NOTE — Patient Instructions (Addendum)
Your Results:             Your most recent labs Goal  Total Cholesterol 107 < 200  Triglycerides 42 < 150  HDL (happy/good cholesterol) 29 > 40  LDL (lousy/bad cholesterol 70 < 70     Medication changes:  Start CoQ10 200-300 mcg daily, after 2-3 weeks, start atorvastatin 10 mg once daily  If you have any problems with the atorvastatin please reach out to me thru My Chart - Tommy Medal PharmD  Future options to lower cholesterol:  Zetia (tablet that blocks absorption from food), Repatha (injection), Nexletol (tablet), Leqvio (6 month injection)    Thank you for choosing [facility]

## 2020-10-10 NOTE — Progress Notes (Signed)
10/11/2020 Ronald Noble 1945-08-07 [id number]   HPI:  Ronald Noble is a 75 y.o. male patient of Dr Aundra Dubin, who presents today for a lipid clinic evaluation.  See pertinent past medical history below.  He saw Dr. Aundra Dubin after having a coronary CT which showed his coronary calcium score to be 410.  He was placed on rosuvastatin, but unfortunately this interfered with his Parkinson's disease, causing him to have more difficulty with movement.  He noted that with his disease he has trouble "starting and stopping", and this was worsened when he took the rosuvastatin.  Having discontinued, he feels to be back to his baseline level.    Past Medical History: ASCVD Coronary calcium score 410 (concentrated in LAD  Parkinson's Disease Has deep brain stimulator, has some issues with mobility, but gets around fairly well; history of orthostatic hypotension    Current Medications: none  Cholesterol Goals: LDL < 70   Intolerant/previously tried: rosuvastatin - myalgias, parkinson's symptoms worsened  Family history: one uncle with MI, 1 sister with some heart issues (also has Parkinson's)  Diet: mostly home cooked, eats sandwiches out for lunch at local [employer]  Exercise:  PT with neurology  Labs: 10/01/20 TC 107, TG 42, HDL 29, LDL 70 (stopped rosuvastatin 2-3 weeks prior to test)   Current Outpatient Medications  Medication Sig Dispense Refill   amantadine (SYMMETREL) 100 MG capsule 2 (two) times daily.   2   aspirin EC 81 MG tablet Take 1 tablet (81 mg total) by mouth daily. Swallow whole. 90 tablet 2   atorvastatin (LIPITOR) 10 MG tablet Take 1 tablet (10 mg total) by mouth daily. 30 tablet 3   carbidopa-levodopa (SINEMET IR) 25-100 MG tablet Take 1.5 tablets by mouth 5 (five) times daily.     cholecalciferol (VITAMIN D3) 25 MCG (1000 UNIT) tablet Take 2,000 Units by mouth daily.     clonazePAM (KLONOPIN) 0.25 MG disintegrating tablet Take 2 tablets (0.5 mg total) by mouth at  bedtime. 60 tablet 3   Coenzyme Q10 (COQ10) 200 MG CAPS Take 200 mg by mouth daily. 90 capsule 3   ibandronate (BONIVA) 150 MG tablet Take 150 mg by mouth every 30 (thirty) days. Take in the morning with a full glass of water, on an empty stomach, and do not take anything else by mouth or lie down for the next 30 min.     Ropinirole HCl 6 MG TB24 daily. 1 tablet at 5 pm  0   sildenafil (REVATIO) 20 MG tablet Take 20 mg by mouth as needed.     No current facility-administered medications for this visit.    Allergies  Allergen Reactions   Rosuvastatin     myalgias    Past Medical History:  Diagnosis Date   BPH (benign prostatic hyperplasia)    COVID 04/24/2020   scratchy throat x 1 day all symptoms resolved   Parkinson disease ([place])    Skin cancer of face    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   Sleep apnea    mild no cpap used   Urethral stricture     Blood pressure 98/62, pulse 75, resp. rate 16, height 5\' 11"  (1.803 m), weight 155 lb (70.3 kg), SpO2 96 %.   Hyperlipidemia Patient with ASCVD and hyperlipidemia.  His most recent LDL was at 70, however he discontinued rosuvastatin shortly before labs were drawn.  Because he has not tried and failed 2 statin drugs will have him start atorvastatin 10  mg daily.  Recommended that he start CoQ10  2-3 weeks prior to starting the atorvastatin.  Reviewed future options should he not tolerate the atorvastatin, including ezetimibe, PCSK-9 inhibitors, bempedoic acid and inclisiran.  Discussed mechanisms of action, dosing, side effects and potential decreases in LDL cholesterol.  Answered all patient questions.  If he can tolerate the atorvastatin we will repeat labs in 2 months.  If he develops side effects he will call the office and we can then determine next best course of action.     Tommy Medal PharmD CPP [facility] [address] [place],  [address] [phone]

## 2020-10-11 ENCOUNTER — Telehealth: Payer: Self-pay | Admitting: *Deleted

## 2020-10-11 DIAGNOSIS — E785 Hyperlipidemia, unspecified: Secondary | ICD-10-CM | POA: Insufficient documentation

## 2020-10-11 NOTE — Telephone Encounter (Signed)
I called and spoke to [facility] after receiving at my desk at outpt ref request relating to order from [name] and is in [place].  Ronald Noble with [facility] will call him later today re: referral.  Pt had  been seen there before.

## 2020-10-11 NOTE — Assessment & Plan Note (Signed)
Patient with ASCVD and hyperlipidemia.  His most recent LDL was at 70, however he discontinued rosuvastatin shortly before labs were drawn.  Because he has not tried and failed 2 statin drugs will have him start atorvastatin 10 mg daily.  Recommended that he start CoQ10  2-3 weeks prior to starting the atorvastatin.  Reviewed future options should he not tolerate the atorvastatin, including ezetimibe, PCSK-9 inhibitors, bempedoic acid and inclisiran.  Discussed mechanisms of action, dosing, side effects and potential decreases in LDL cholesterol.  Answered all patient questions.  If he can tolerate the atorvastatin we will repeat labs in 2 months.  If he develops side effects he will call the office and we can then determine next best course of action.

## 2020-10-23 DIAGNOSIS — N390 Urinary tract infection, site not specified: Secondary | ICD-10-CM | POA: Diagnosis not present

## 2020-10-23 DIAGNOSIS — R8271 Bacteriuria: Secondary | ICD-10-CM | POA: Diagnosis not present

## 2020-10-23 DIAGNOSIS — N35011 Post-traumatic bulbous urethral stricture: Secondary | ICD-10-CM | POA: Diagnosis not present

## 2020-10-30 DIAGNOSIS — K117 Disturbances of salivary secretion: Secondary | ICD-10-CM | POA: Diagnosis not present

## 2020-10-30 DIAGNOSIS — G245 Blepharospasm: Secondary | ICD-10-CM | POA: Diagnosis not present

## 2020-11-01 ENCOUNTER — Encounter: Payer: Self-pay | Admitting: Physical Therapy

## 2020-11-01 ENCOUNTER — Other Ambulatory Visit: Payer: Self-pay

## 2020-11-01 ENCOUNTER — Ambulatory Visit: Payer: Medicare Other | Attending: Orthopaedic Surgery | Admitting: Physical Therapy

## 2020-11-01 DIAGNOSIS — R293 Abnormal posture: Secondary | ICD-10-CM | POA: Insufficient documentation

## 2020-11-01 DIAGNOSIS — R2681 Unsteadiness on feet: Secondary | ICD-10-CM | POA: Diagnosis not present

## 2020-11-01 DIAGNOSIS — R29818 Other symptoms and signs involving the nervous system: Secondary | ICD-10-CM | POA: Diagnosis not present

## 2020-11-01 DIAGNOSIS — M6281 Muscle weakness (generalized): Secondary | ICD-10-CM | POA: Diagnosis not present

## 2020-11-01 DIAGNOSIS — R2689 Other abnormalities of gait and mobility: Secondary | ICD-10-CM | POA: Diagnosis not present

## 2020-11-01 NOTE — Therapy (Signed)
[facility] [address] [place], [name], [address] Phone: [phone]   Fax:  [phone]  Physical Therapy Evaluation  Patient Details  Name: Ronald Noble MRN: [id number] Date of Birth: 20-Aug-1945 Referring Provider (PT): Eldridge Abrahams   Encounter Date: 11/01/2020   PT End of Session - 11/01/20 1236     Visit Number 1    Number of Visits 13    Date for PT Re-Evaluation 12/13/20    Authorization Type Medicare & BCBS    PT Start Time 0804    PT Stop Time 0845    PT Time Calculation (min) 41 min    Activity Tolerance Patient tolerated treatment well    Behavior During Therapy [facility] for tasks assessed/performed             Past Medical History:  Diagnosis Date   BPH (benign prostatic hyperplasia)    COVID 04/24/2020   scratchy throat x 1 day all symptoms resolved   Parkinson disease ([place])    Skin cancer of face    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   Sleep apnea    mild no cpap used   Urethral stricture     Past Surgical History:  Procedure Laterality Date   BURR HOLE W/ STEREOTACTIC INSERTION OF DBS LEADS / INTRAOP MICROELECTRODE RECORDING  04/2014   [facility]   COLONOSCOPY  2019   CYSTOSCOPY WITH URETHRAL DILATATION N/A 06/15/2019   Procedure: CYSTOSCOPY WITH URETHRAL DILATATION;  Surgeon: Franchot Gallo, MD;  Location: [facility];  Service: Urology;  Laterality: N/A;  [place] N/A 05/09/2020   Procedure: CYSTOSCOPY WITH BALLOON DILATATION OF POSTERIOR URETHRAL STRICTURE, FLUOROSCOPIC INTERPRETATION;  Surgeon: Franchot Gallo, MD;  Location: [facility];  Service: Urology;  Laterality: N/A;   KNEE SURGERY  yrs ago   exploratory/left   SKIN CANCER EXCISION  01/2014   face   TRANSURETHRAL RESECTION OF PROSTATE     2008    There were no vitals filed for this visit.     Subjective Assessment - 11/01/20 0808     Subjective Saw a heart doctor and put me on statin, and it made me very weak.  He took me off that and now wants me to try another.  This all started a few months ago.  Told neurologist that I am having trouble "braking" or stopping.  Worse in the afternoon/evenings when I'm tired.  Pt describes as body getting ahead of feet and toes landing first.  Had some stumbles, occasional falls tripping over something.  At least 5 falls.    Pertinent History PD 2008, bilateral DBS (2018); hyperlipidemia    Patient Stated Goals Want to stop the falls.    Currently in Pain? No/denies                [facility] PT Assessment - 11/01/20 0001       Assessment   Medical Diagnosis Parkinson's disease    Referring Provider (PT) Eldridge Abrahams    Onset Date/Surgical Date 10/01/20   MD order   Hand Dominance Right    Prior Therapy [facility] Neuro [facility]      Precautions   Precautions Fall    Precaution Comments DBS      Balance Screen   Has the patient fallen in the past 6 months Yes    How many times? 5  Has the patient had a decrease in activity level because of a fear of falling?  No    Is the patient reluctant to leave their home because of a fear of falling?  No      Home Social worker Private residence    Living Arrangements Spouse/significant other    Available Help at Discharge Family    Type of [place] to enter    Entrance Stairs-Number of Steps 2    Entrance Stairs-Rails Left    [place] Two level;Able to live on main level with bedroom/bathroom    Home Equipment Other (comment);Cane - quad;Walker - standard    Additional Comments 2 steps down into den      Prior Function   Level of Independence Independent    Vocation Full time employment    Event organiser    Leisure Floor stretches/PT exercise and yoga several times per week, rides bike in neighborhood       Observation/Other Assessments   Focus on Therapeutic Outcomes (FOTO)  NA      Posture/Postural Control   Posture/Postural Control Postural limitations    Postural Limitations Rounded Shoulders;Weight shift right;Flexed trunk;Forward head    Posture Comments R hip rotated anteriorly      ROM / Strength   AROM / PROM / Strength Strength;AROM      AROM   Overall AROM  Within functional limits for tasks performed    Overall AROM Comments BLEs      Strength   Overall Strength Within functional limits for tasks performed    Overall Strength Comments Grossly 5/5 LLE, RLE 4+/5 hip flexion, 4+/5 knee flexion, grossly 5/5 remainder of LE      Transfers   Transfers Sit to Stand;Stand to Sit    Sit to Stand 6: Modified independent (Device/Increase time);Without upper extremity assist;From chair/3-in-1    Sit to Stand Details (indicate cue type and reason) Decreased forward lean with sit>stand    Five time sit to stand comments  10.81   1st trial of sit>stand, has retropulsion   Stand to Sit 6: Modified independent (Device/Increase time);Without upper extremity assist;To chair/3-in-1      Ambulation/Gait   Ambulation/Gait Yes    Ambulation/Gait Assistance 5: Supervision    Ambulation/Gait Assistance Details Not noted in PT session, but pt describes hastening of gait (body forward, toes hitting ground first, increased speed), which typically occurs at the end of the day    Ambulation Distance (Feet) 100 Feet    Assistive device None    Gait Pattern Decreased arm swing - right;Lateral trunk lean to right;Decreased trunk rotation;Decreased step length - left;Decreased step length - right;Trunk flexed;Poor foot clearance - right    Ambulation Surface Level;Indoor    Gait velocity 9.06 sec =3.62 ft/sec      Mini-BESTest   Sit To Stand Normal: Comes to stand without use of hands and stabilizes independently.    Rise to Toes Normal: Stable for 3 s with maximum height.    Stand on one leg (left)  Severe: Unable    Stand on one leg (right) Moderate: < 20 s   3.22, 6.47   Stand on one leg - lowest score 0    Compensatory Stepping Correction - Forward Normal: Recovers independently with a single, large step (second realignement is allowed).    Compensatory Stepping Correction - Backward Moderate: More than one step is required to recover equilibrium  Compensatory Stepping Correction - Left Lateral Moderate: Several steps to recover equilibrium    Compensatory Stepping Correction - Right Lateral Severe:  Falls, or cannot step    Stepping Corredtion Lateral - lowest score 0    Stance - Feet together, eyes open, firm surface  Normal: 30s    Stance - Feet together, eyes closed, foam surface  Moderate: < 30s   8.03   Incline - Eyes Closed Normal: Stands independently 30s and aligns with gravity    Change in Gait Speed Normal: Significantly changes walkling speed without imbalance    Walk with head turns - Horizontal Normal: performs head turns with no change in gait speed and good balance    Walk with pivot turns Normal: Turns with feet close FAST (< 3 steps) with good balance.    Step over obstacles Moderate: Steps over box but touches box OR displays cautious behavior by slowing gait.    Timed UP & GO with Dual Task Normal: No noticeable change in sitting, standing or walking while backward counting when compared to TUG without    Mini-BEST total score 21      Timed Up and Go Test   TUG Normal TUG;Manual TUG;Cognitive TUG    Normal TUG (seconds) 9.84    Manual TUG (seconds) 8.81    Cognitive TUG (seconds) 10.35    TUG Comments Scores >13.5-15 sec indicate increased fall risk.      RLE Tone   RLE Tone Mild      LLE Tone   LLE Tone Within Functional Limits                        Objective measurements completed on examination: See above findings.               PT Education - 11/01/20 [phone]     Education Details Results of eval; POC    Person(s)  Educated Patient    Methods Explanation    Comprehension Verbalized understanding              PT Short Term Goals - 11/01/20 1243       PT SHORT TERM GOAL #1   Title Pt will be independent with HEP for improved strength, balance, transfers, and gait.  TARGET 11/29/2020    Time 4    Period Weeks    Status New      PT SHORT TERM GOAL #2   Title Patient will verbalize fall prevention strategies in the home, including tips to reduce hastening of gait, in order to decrease risk of future falls.    Time 4    Period Weeks      PT SHORT TERM GOAL #3   Title Pt will perform at least 8 of 10 reps, minimal to no UE support, with NO retropulsion or posterior lean for imrpoved trasnfer safety and efficiency.    Time 4    Period Weeks    Status New      PT SHORT TERM GOAL #4   Title Pt will improve MiniBESTest score to at least 24/28 for decreased fall risk.    Baseline 21/28    Time 4    Period Weeks    Status New               PT Long Term Goals - 11/01/20 1246       PT LONG TERM GOAL #1   Title Pt will be independent with progression of  HEP for improved strength, balance, transfers, and gait.  TARGET 12/13/2020    Time 6    Period Weeks    Status New      PT LONG TERM GOAL #2   Title Pt will verbalize plans for ongoing community fitness upon d/c from PT to maximize gains made in therapy.    Time 6    Period Weeks    Status New      PT LONG TERM GOAL #3   Title Pt will report at least 50% improvement in (decrease in) hastening episodes at home, for improved functional mobility.    Time 6    Period Weeks    Status New                    Plan - 11/01/20 1238     Clinical Impression Statement Pt is a 75 yo male with history of parkinson's disease, deep brain stimulator placement, with reports of increased hastneing of gait and at least 5 falls in the past 6 months.  Hastening of gait, he notes, typically occurs later in the day and with fatigue, and he  often needs to reach out to stop to prevent falls.  Pt presents with abnormal posture, bradykinesia, rigidity, decreased balance, decreased timing and coordiantion of gait, postural instability.  He is at fall risk per MiniBESTest score of 21/28.  He does require >1-2 steps in posterior direction and to the right with push and release test.  He would benefit from skilled PT to address the above stated deficits to decrease fall risk and improve overall functional mobility.    Personal Factors and Comorbidities Comorbidity 2    Comorbidities Parkinson's disease, HLD    Examination-Activity Limitations Locomotion Level;Transfers;Stand    Examination-Participation Restrictions Community Activity;Occupation    Stability/Clinical Decision Making Stable/Uncomplicated    Clinical Decision Making Low    Rehab Potential Good    PT Frequency 2x / week    PT Duration 6 weeks   plus eval   PT Treatment/Interventions Therapeutic activities;Therapeutic exercise;Patient/family education;Balance training;Neuromuscular re-education;DME Instruction;Gait training;Functional mobility training    PT Next Visit Plan Tips to reduce hastening wtih gait, PWR! Moves (especially PWR! Up and PWR! Step for posture, balance); can consider trial of cane/device to help with safety/stability with gait at end of day.    Consulted and Agree with Plan of Care Patient             Patient will benefit from skilled therapeutic intervention in order to improve the following deficits and impairments:  Abnormal gait, Decreased balance, Decreased coordination, Decreased range of motion, Decreased mobility, Decreased strength, Difficulty walking, Postural dysfunction, Decreased activity tolerance, Pain  Visit Diagnosis: Other abnormalities of gait and mobility  Unsteadiness on feet  Abnormal posture  Other symptoms and signs involving the nervous system  Muscle weakness (generalized)     Problem List Patient Active Problem  List   Diagnosis Date Noted   Hyperlipidemia 10/11/2020   Bilateral olecranon bursitis 07/05/2019   Other bursitis of knee, left knee 12/06/2018   Pain in right leg 12/06/2018   Parkinson disease ([place])     Nigeria Lasseter W. 11/01/2020, 12:49 PM Frazier Butt., PT  [facility] [address] [place], [name], [address] Phone: [phone]   Fax:  [phone]  Name: Denilson Langeland MRN: [id number] Date of Birth: 11/26/1945

## 2020-11-06 DIAGNOSIS — Z20822 Contact with and (suspected) exposure to covid-19: Secondary | ICD-10-CM | POA: Diagnosis not present

## 2020-11-06 IMAGING — CR DG HIP (WITH OR WITHOUT PELVIS) 2-3V RIGHT
2 series · 2 of 2 positions shown · non-contrast
Comparison: None.

CLINICAL DATA: Fall 3 days ago with persistent pain, initial
encounter

EXAM:
DG HIP (WITH OR WITHOUT PELVIS) 2V RIGHT

[w hip ap right]
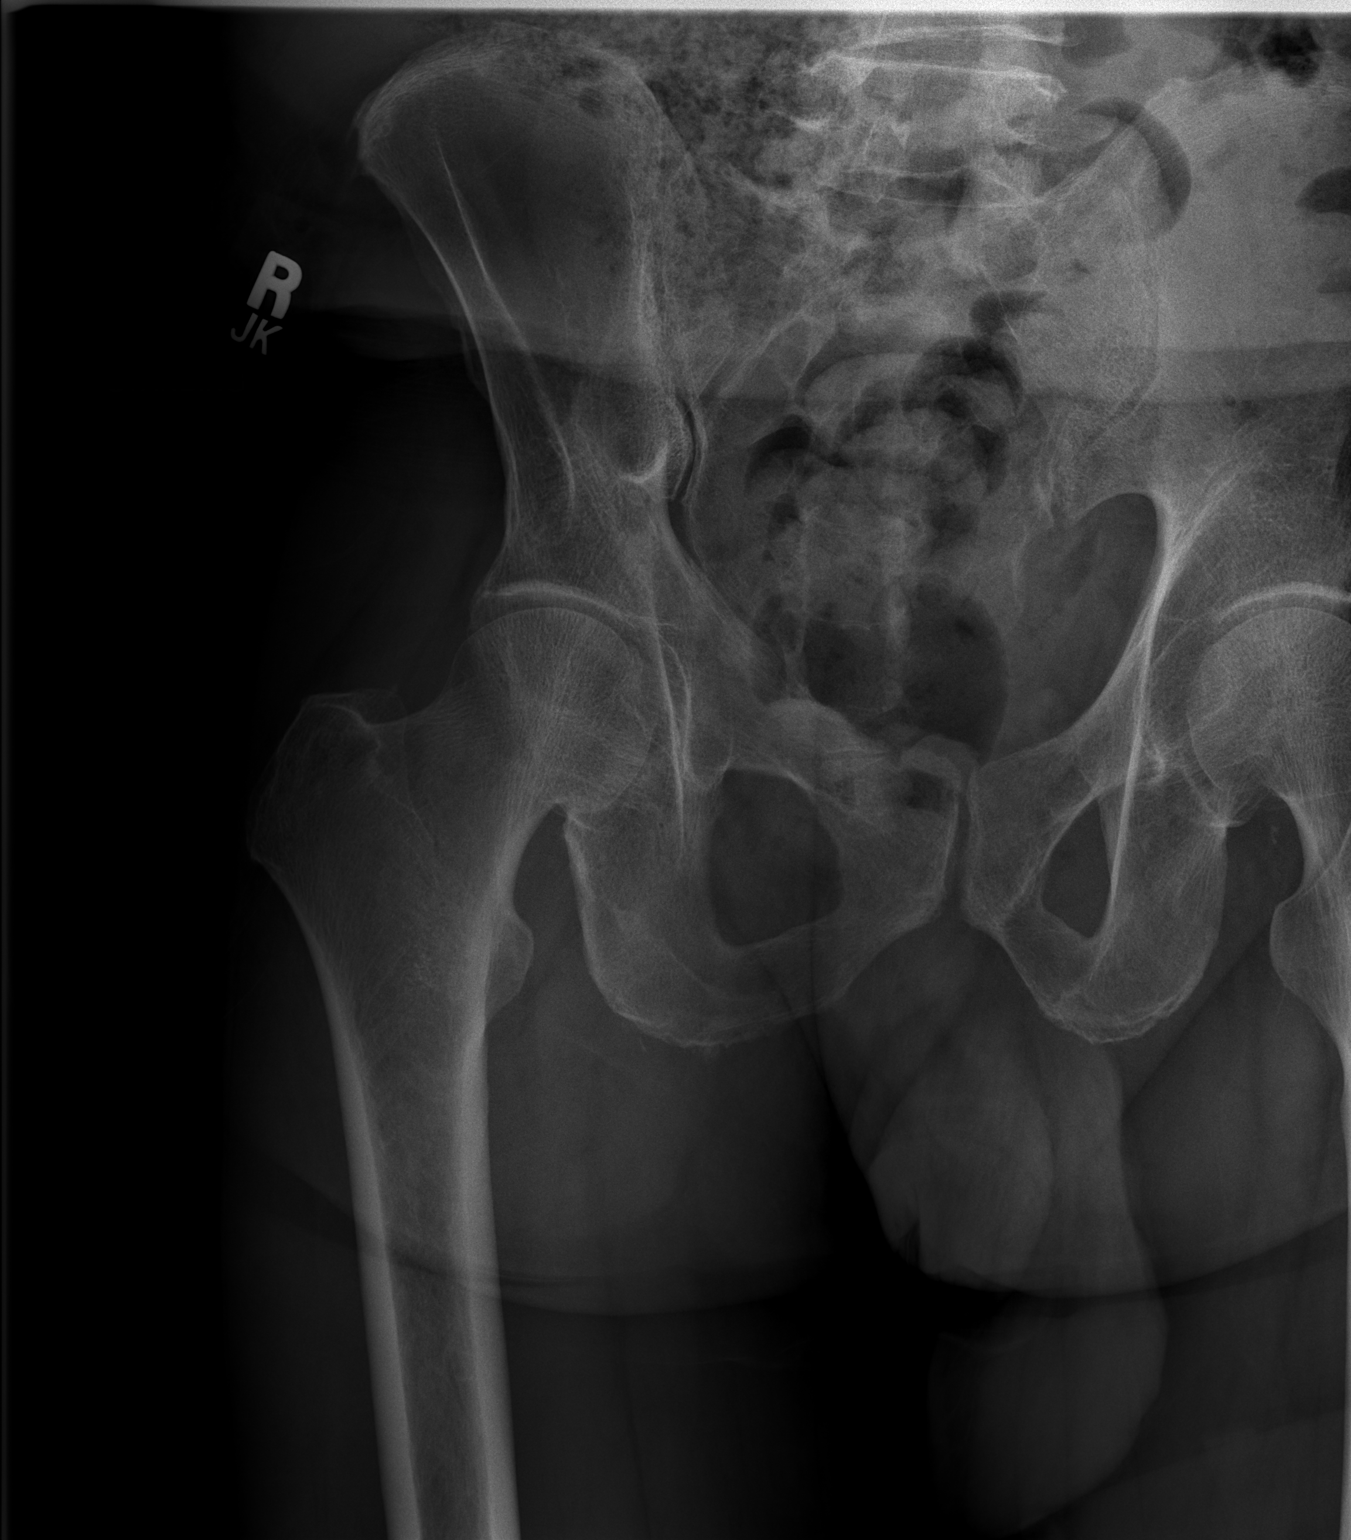

[w hip frog right]
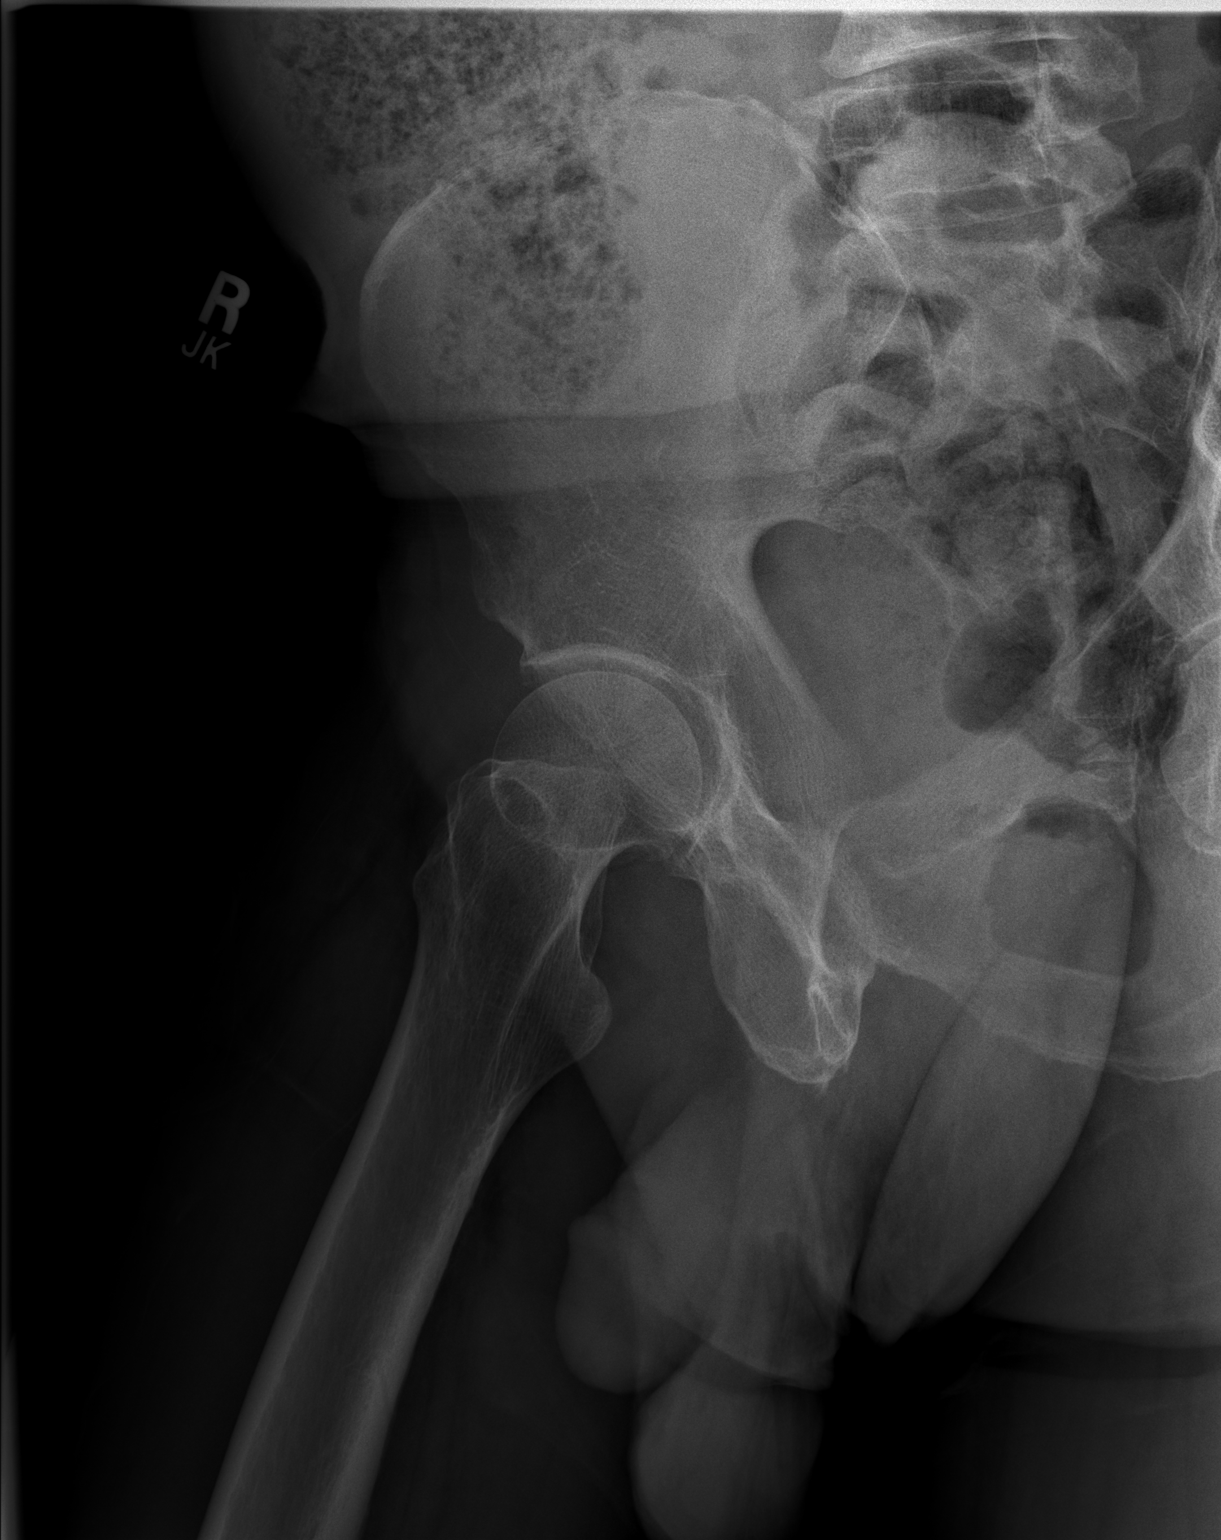

[2 of 2 positions shown; findings below may reference images not displayed]

FINDINGS: Pelvic ring is intact. No acute fracture or dislocation is noted. No
soft tissue abnormality is seen.
IMPRESSION: No acute abnormality noted.

## 2020-11-07 ENCOUNTER — Ambulatory Visit: Payer: Medicare Other | Attending: Orthopaedic Surgery | Admitting: Physical Therapy

## 2020-11-07 ENCOUNTER — Other Ambulatory Visit: Payer: Self-pay

## 2020-11-07 ENCOUNTER — Encounter: Payer: Self-pay | Admitting: Physical Therapy

## 2020-11-07 DIAGNOSIS — R2689 Other abnormalities of gait and mobility: Secondary | ICD-10-CM | POA: Insufficient documentation

## 2020-11-07 DIAGNOSIS — M6281 Muscle weakness (generalized): Secondary | ICD-10-CM | POA: Diagnosis not present

## 2020-11-07 DIAGNOSIS — R293 Abnormal posture: Secondary | ICD-10-CM | POA: Diagnosis not present

## 2020-11-07 DIAGNOSIS — R29818 Other symptoms and signs involving the nervous system: Secondary | ICD-10-CM | POA: Insufficient documentation

## 2020-11-07 DIAGNOSIS — R2681 Unsteadiness on feet: Secondary | ICD-10-CM | POA: Insufficient documentation

## 2020-11-07 NOTE — Patient Instructions (Signed)
Provided handouts for:  PWR! Up seated, x 10-20 reps  PWR! Up standing, x 10-20 reps  Do at least once per day.

## 2020-11-07 NOTE — Therapy (Signed)
[facility] [address] [place], [name], [address] Phone: [phone]   Fax:  [phone]  Physical Therapy Treatment  Patient Details  Name: Ronald Noble MRN: [id number] Date of Birth: 1945-05-10 Referring Provider (PT): Eldridge Abrahams   Encounter Date: 11/07/2020   PT End of Session - 11/07/20 1401     Visit Number 2    Number of Visits 13    Date for PT Re-Evaluation 12/13/20    Authorization Type Medicare & BCBS    PT Start Time [id number]    PT Stop Time 1444    PT Time Calculation (min) 42 min    Equipment Utilized During Treatment Gait belt    Activity Tolerance Patient tolerated treatment well    Behavior During Therapy WFL for tasks assessed/performed             Past Medical History:  Diagnosis Date   BPH (benign prostatic hyperplasia)    COVID 04/24/2020   scratchy throat x 1 day all symptoms resolved   Parkinson disease ([place])    Skin cancer of face    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   Sleep apnea    mild no cpap used   Urethral stricture     Past Surgical History:  Procedure Laterality Date   BURR HOLE W/ STEREOTACTIC INSERTION OF DBS LEADS / INTRAOP MICROELECTRODE RECORDING  04/2014   [facility]   COLONOSCOPY  2019   CYSTOSCOPY WITH URETHRAL DILATATION N/A 06/15/2019   Procedure: CYSTOSCOPY WITH URETHRAL DILATATION;  Surgeon: Franchot Gallo, MD;  Location: [facility];  Service: Urology;  Laterality: N/A;  [place] N/A 05/09/2020   Procedure: CYSTOSCOPY WITH BALLOON DILATATION OF POSTERIOR URETHRAL STRICTURE, FLUOROSCOPIC INTERPRETATION;  Surgeon: Franchot Gallo, MD;  Location: [facility];  Service: Urology;  Laterality: N/A;   KNEE SURGERY  yrs ago   exploratory/left   SKIN CANCER EXCISION  01/2014   face   TRANSURETHRAL RESECTION OF PROSTATE     2008    There  were no vitals filed for this visit.   Subjective Assessment - 11/07/20 1401     Subjective No falls, no changes.  "You haven't seen the worst of it, with the speeding up." (Remarking about how he is walking fast into therapy session, carrying items in both hands)    Pertinent History PD 2008, bilateral DBS (2018); hyperlipidemia    Patient Stated Goals Want to stop the falls.    Currently in Pain? No/denies                               [facility] Adult PT Treatment/Exercise - 11/07/20 0001       Transfers   Transfers Sit to Stand;Stand to Sit    Sit to Stand 6: Modified independent (Device/Increase time);Without upper extremity assist;From bed    Sit to Stand Details Verbal cues for technique;Verbal cues for sequencing    Sit to Stand Details (indicate cue type and reason) Cues for initial increased forward lean to avoid retropulsion    Stand to Sit 6: Modified independent (Device/Increase time);Without upper extremity assist;To bed    Number of Reps 1 set;Other reps (comment)   5 reps, cues for upright posture upon standing.     Ambulation/Gait   Ambulation/Gait Yes    Ambulation/Gait Assistance 5: Supervision  Ambulation Distance (Feet) 465 Feet   115 ft x 2 at end of session   Assistive device None    Gait Pattern Decreased arm swing - right;Lateral trunk lean to right;Decreased trunk rotation;Decreased step length - left;Decreased step length - right;Trunk flexed;Poor foot clearance - right;Decreased arm swing - left    Ambulation Surface Level;Indoor    Gait Comments Cues to PWR! Up with best posture to initiate gait and to stop, PWR! Up with wide BOS and upright posture, to reset and start again with deliberate pacing, when he feels he is beginning to hasten with gait.      High Level Balance   High Level Balance Comments Forward step and weightshift with cues for tall posture through stance leg, x 8 reps each.  Seated PWR! Up at edge of mat, x 10 reps;  standing PWR! Up with chair in front if needed, x 10 reps.  Verbal, visual, tactile cues for scapular retraction, VCs for glut/quad activation and hold time 5 seconds.  At counter, resisted forward walking, 20 ft x 6 reps, with cues for upright posture, forward excursion through hips to maintain posture.                    PT Education - 11/07/20 1858     Education Details Initial HEP-see instructions    Person(s) Educated Patient    Methods Explanation;Demonstration;Tactile cues;Verbal cues;Handout    Comprehension Verbalized understanding;Returned demonstration;Verbal cues required;Need further instruction              PT Short Term Goals - 11/01/20 1243       PT SHORT TERM GOAL #1   Title Pt will be independent with HEP for improved strength, balance, transfers, and gait.  TARGET 11/29/2020    Time 4    Period Weeks    Status New      PT SHORT TERM GOAL #2   Title Patient will verbalize fall prevention strategies in the home, including tips to reduce hastening of gait, in order to decrease risk of future falls.    Time 4    Period Weeks      PT SHORT TERM GOAL #3   Title Pt will perform at least 8 of 10 reps, minimal to no UE support, with NO retropulsion or posterior lean for imrpoved trasnfer safety and efficiency.    Time 4    Period Weeks    Status New      PT SHORT TERM GOAL #4   Title Pt will improve MiniBESTest score to at least 24/28 for decreased fall risk.    Baseline 21/28    Time 4    Period Weeks    Status New               PT Long Term Goals - 11/01/20 1246       PT LONG TERM GOAL #1   Title Pt will be independent with progression of HEP for improved strength, balance, transfers, and gait.  TARGET 12/13/2020    Time 6    Period Weeks    Status New      PT LONG TERM GOAL #2   Title Pt will verbalize plans for ongoing community fitness upon d/c from PT to maximize gains made in therapy.    Time 6    Period Weeks    Status New       PT LONG TERM GOAL #3   Title Pt will report at least 50%  improvement in (decrease in) hastening episodes at home, for improved functional mobility.    Time 6    Period Weeks    Status New                   Plan - 11/07/20 1859     Clinical Impression Statement Initiated HEP today to address posture for improved postural stability in standing and with gait.  With it being the afternoon time, pt does demonstrate some hastening of gait with conversation tasks and carrying items in both hands (from lobby to gym area).  Skilled PT focused on exercises, tips, strategies to improve posture, deliberate and paced movement patterns to address decreasing the hastening of gait.  With attention and focus to task, he is able to demo decreased hastening with activities in session, but he will likely need addition repetition and practice to establish optimal movement patterns.    Personal Factors and Comorbidities Comorbidity 2    Comorbidities Parkinson's disease, HLD    Examination-Activity Limitations Locomotion Level;Transfers;Stand    Examination-Participation Restrictions Community Activity;Occupation    Stability/Clinical Decision Making Stable/Uncomplicated    Rehab Potential Good    PT Frequency 2x / week    PT Duration 6 weeks   plus eval   PT Treatment/Interventions Therapeutic activities;Therapeutic exercise;Patient/family education;Balance training;Neuromuscular re-education;DME Instruction;Gait training;Functional mobility training    PT Next Visit Plan Review initial HEP, Tips to reduce hastening wtih gait, PWR! Moves (especially PWR! Up and PWR! Step for posture, balance); can consider trial of cane/device to help with safety/stability with gait at end of day.  Resisted standing and gait.    Consulted and Agree with Plan of Care Patient             Patient will benefit from skilled therapeutic intervention in order to improve the following deficits and impairments:  Abnormal  gait, Decreased balance, Decreased coordination, Decreased range of motion, Decreased mobility, Decreased strength, Difficulty walking, Postural dysfunction, Decreased activity tolerance, Pain  Visit Diagnosis: Other abnormalities of gait and mobility  Abnormal posture  Unsteadiness on feet     Problem List Patient Active Problem List   Diagnosis Date Noted   Hyperlipidemia 10/11/2020   Bilateral olecranon bursitis 07/05/2019   Other bursitis of knee, left knee 12/06/2018   Pain in right leg 12/06/2018   Parkinson disease ([place])     Amon Costilla W. 11/07/2020, 7:08 PM Frazier Butt., PT  [facility] [address] [place], [name], [address] Phone: [phone]   Fax:  [phone]  Name: Jash Jason MRN: [id number] Date of Birth: 1945/04/08

## 2020-11-08 DIAGNOSIS — Z85828 Personal history of other malignant neoplasm of skin: Secondary | ICD-10-CM | POA: Diagnosis not present

## 2020-11-08 DIAGNOSIS — L821 Other seborrheic keratosis: Secondary | ICD-10-CM | POA: Diagnosis not present

## 2020-11-08 DIAGNOSIS — L570 Actinic keratosis: Secondary | ICD-10-CM | POA: Diagnosis not present

## 2020-11-08 DIAGNOSIS — L565 Disseminated superficial actinic porokeratosis (DSAP): Secondary | ICD-10-CM | POA: Diagnosis not present

## 2020-11-08 DIAGNOSIS — D1801 Hemangioma of skin and subcutaneous tissue: Secondary | ICD-10-CM | POA: Diagnosis not present

## 2020-11-08 DIAGNOSIS — D0462 Carcinoma in situ of skin of left upper limb, including shoulder: Secondary | ICD-10-CM | POA: Diagnosis not present

## 2020-11-12 ENCOUNTER — Ambulatory Visit: Payer: Medicare Other | Admitting: Physical Therapy

## 2020-11-18 ENCOUNTER — Ambulatory Visit: Payer: Medicare Other | Admitting: Physical Therapy

## 2020-11-18 ENCOUNTER — Other Ambulatory Visit: Payer: Self-pay

## 2020-11-18 DIAGNOSIS — R2681 Unsteadiness on feet: Secondary | ICD-10-CM | POA: Diagnosis not present

## 2020-11-18 DIAGNOSIS — R293 Abnormal posture: Secondary | ICD-10-CM

## 2020-11-18 DIAGNOSIS — R29818 Other symptoms and signs involving the nervous system: Secondary | ICD-10-CM | POA: Diagnosis not present

## 2020-11-18 DIAGNOSIS — R2689 Other abnormalities of gait and mobility: Secondary | ICD-10-CM | POA: Diagnosis not present

## 2020-11-18 DIAGNOSIS — M6281 Muscle weakness (generalized): Secondary | ICD-10-CM | POA: Diagnosis not present

## 2020-11-18 NOTE — Therapy (Signed)
[facility] [address] [place], [name], [address] Phone: [phone]   Fax:  [phone]  Physical Therapy Treatment  Patient Details  Name: Ronald Noble MRN: [id number] Date of Birth: [birth year] Referring Provider (PT): Eldridge Abrahams   Encounter Date: 11/18/2020   PT End of Session - 11/18/20 0716     Visit Number 3    Number of Visits 13    Date for PT Re-Evaluation 12/13/20    Authorization Type Medicare & BCBS    PT Start Time 0719    PT Stop Time 0758    PT Time Calculation (min) 39 min    Equipment Utilized During Treatment --    Activity Tolerance Patient tolerated treatment well    Behavior During Therapy [facility] for tasks assessed/performed             Past Medical History:  Diagnosis Date   BPH (benign prostatic hyperplasia)    COVID 04/24/2020   scratchy throat x 1 day all symptoms resolved   Parkinson disease ([place])    Skin cancer of face    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   Sleep apnea    mild no cpap used   Urethral stricture     Past Surgical History:  Procedure Laterality Date   BURR HOLE W/ STEREOTACTIC INSERTION OF DBS LEADS / INTRAOP MICROELECTRODE RECORDING  04/2014   [facility] in [facility]   COLONOSCOPY  2019   CYSTOSCOPY WITH URETHRAL DILATATION N/A 06/15/2019   Procedure: CYSTOSCOPY WITH URETHRAL DILATATION;  Surgeon: Franchot Gallo, MD;  Location: [place];  Service: Urology;  Laterality: N/A;  [place] N/A 05/09/2020   Procedure: CYSTOSCOPY WITH BALLOON DILATATION OF POSTERIOR URETHRAL STRICTURE, FLUOROSCOPIC INTERPRETATION;  Surgeon: Franchot Gallo, MD;  Location: [facility];  Service: Urology;  Laterality: N/A;   KNEE SURGERY  yrs ago   exploratory/left   SKIN CANCER EXCISION  01/2014   face   TRANSURETHRAL RESECTION OF PROSTATE     2008    There were no  vitals filed for this visit.   Subjective Assessment - 11/18/20 0716     Subjective No falls.  Have been exercising more and that helps.    Pertinent History PD 2008, bilateral DBS (2018); hyperlipidemia    Patient Stated Goals Want to stop the falls.    Currently in Pain? No/denies                     Reviewed standing PWR! Moves, x 10 reps, 5 sec hold.  Cues for glut activation and scapular retraction for optimal posture in standing.  Quadruped PWR! Moves: PWR! Up, x 10 reps for posture PWR! Rock x 10 for flexibility and weightshifting PWR! Twist x 5 reps each side for trunk rotation PWR! Step, x 5 reps each side, with PWR! Up posture position  Cues for technique, hold time with focus on posture              [facility] Adult PT Treatment/Exercise - 11/18/20 0001       Transfers   Transfers Sit to Stand;Stand to Sit    Sit to Stand 6: Modified independent (Device/Increase time);Without upper extremity assist;From bed    Sit to Stand Details Verbal cues for technique;Verbal cues for sequencing    Sit to Stand Details (indicate cue type and reason) Cues upon standing for scapular and glut squeeze  Stand to Sit 6: Modified independent (Device/Increase time);Without upper extremity assist;To bed    Number of Reps 10 reps;2 sets   2nd set standing on Airex   Comments Cues for initial foward lean, towards end of set.      Ambulation/Gait   Ambulation/Gait Yes    Ambulation/Gait Assistance 5: Supervision    Ambulation Distance (Feet) 445 Feet    Assistive device None    Gait Pattern Decreased arm swing - right;Lateral trunk lean to right;Decreased trunk rotation;Decreased step length - left;Decreased step length - right;Trunk flexed;Poor foot clearance - right;Decreased arm swing - left    Ambulation Surface Level;Indoor    Gait Comments At end of session, cues for upright posture, heelstrike, arm swing, varied speeds; no evidence of hastening with gait this  session.                 Balance Exercises - 11/18/20 0001       Balance Exercises: Standing   Stepping Strategy Anterior;Foam/compliant surface;10 reps;Limitations;Lateral;Posterior    Stepping Strategy Limitations Cues for posture, foot clearance               PT Education - 11/18/20 0801     Education Details Added quadruped PWR! Moves to HEP; discussed how he could incoporate his quadruped/tall kneel stretches into this exercise routine    Person(s) Educated Patient    Methods Explanation;Demonstration;Handout;Verbal cues    Comprehension Verbalized understanding;Returned demonstration;Verbal cues required;Need further instruction              PT Short Term Goals - 11/01/20 1243       PT SHORT TERM GOAL #1   Title Pt will be independent with HEP for improved strength, balance, transfers, and gait.  TARGET 11/29/2020    Time 4    Period Weeks    Status New      PT SHORT TERM GOAL #2   Title Patient will verbalize fall prevention strategies in the home, including tips to reduce hastening of gait, in order to decrease risk of future falls.    Time 4    Period Weeks      PT SHORT TERM GOAL #3   Title Pt will perform at least 8 of 10 reps, minimal to no UE support, with NO retropulsion or posterior lean for imrpoved trasnfer safety and efficiency.    Time 4    Period Weeks    Status New      PT SHORT TERM GOAL #4   Title Pt will improve MiniBESTest score to at least 24/28 for decreased fall risk.    Baseline 21/28    Time 4    Period Weeks    Status New               PT Long Term Goals - 11/01/20 1246       PT LONG TERM GOAL #1   Title Pt will be independent with progression of HEP for improved strength, balance, transfers, and gait.  TARGET 12/13/2020    Time 6    Period Weeks    Status New      PT LONG TERM GOAL #2   Title Pt will verbalize plans for ongoing community fitness upon d/c from PT to maximize gains made in therapy.    Time 6     Period Weeks    Status New      PT LONG TERM GOAL #3   Title Pt will report at least 50% improvement in (  decrease in) hastening episodes at home, for improved functional mobility.    Time 6    Period Weeks    Status New                   Plan - 11/18/20 0802     Clinical Impression Statement Continued skilled PT sessions focused on posture and step length/foot clearance today.  With addition of standing on Airex, pt challenged with posture and foot clearance.  He demonstrates good pacing with exercises throughout session, does need cues for glut and scapular activation for posture.  He will continue to benefit from further skilled PT towards goals for improved functional mobility, decreased fall risk.    Personal Factors and Comorbidities Comorbidity 2    Comorbidities Parkinson's disease, HLD    Examination-Activity Limitations Locomotion Level;Transfers;Stand    Examination-Participation Restrictions Community Activity;Occupation    Stability/Clinical Decision Making Stable/Uncomplicated    Rehab Potential Good    PT Frequency 2x / week    PT Duration 6 weeks   plus eval   PT Treatment/Interventions Therapeutic activities;Therapeutic exercise;Patient/family education;Balance training;Neuromuscular re-education;DME Instruction;Gait training;Functional mobility training    PT Next Visit Plan Review additions to HEP, Tips to reduce hastening wtih gait, PWR! Moves (especially PWR! Up and PWR! Step for posture, balance-on Airex for balance challenge); can consider trial of cane/device to help with safety/stability with gait at end of day.  Resisted standing and gait.    Consulted and Agree with Plan of Care Patient             Patient will benefit from skilled therapeutic intervention in order to improve the following deficits and impairments:  Abnormal gait, Decreased balance, Decreased coordination, Decreased range of motion, Decreased mobility, Decreased strength,  Difficulty walking, Postural dysfunction, Decreased activity tolerance, Pain  Visit Diagnosis: Abnormal posture  Unsteadiness on feet  Other abnormalities of gait and mobility     Problem List Patient Active Problem List   Diagnosis Date Noted   Hyperlipidemia 10/11/2020   Bilateral olecranon bursitis 07/05/2019   Other bursitis of knee, left knee 12/06/2018   Pain in right leg 12/06/2018   Parkinson disease ([place])     Odette Watanabe W. 11/18/2020, 8:05 AM Frazier Butt., PT   [facility] [address] [place], [name], [address] Phone: [phone]   Fax:  [phone]  Name: Geovonnie Koonz MRN: [id number] Date of Birth: 06/01/1945

## 2020-11-19 ENCOUNTER — Ambulatory Visit: Payer: Medicare Other | Admitting: Physical Therapy

## 2020-11-20 ENCOUNTER — Ambulatory Visit: Payer: Medicare Other | Admitting: Physical Therapy

## 2020-11-21 ENCOUNTER — Other Ambulatory Visit: Payer: Self-pay

## 2020-11-21 ENCOUNTER — Ambulatory Visit: Payer: Medicare Other | Admitting: Physical Therapy

## 2020-11-21 DIAGNOSIS — R29818 Other symptoms and signs involving the nervous system: Secondary | ICD-10-CM

## 2020-11-21 DIAGNOSIS — R2689 Other abnormalities of gait and mobility: Secondary | ICD-10-CM | POA: Diagnosis not present

## 2020-11-21 DIAGNOSIS — R2681 Unsteadiness on feet: Secondary | ICD-10-CM | POA: Diagnosis not present

## 2020-11-21 DIAGNOSIS — M6281 Muscle weakness (generalized): Secondary | ICD-10-CM | POA: Diagnosis not present

## 2020-11-21 DIAGNOSIS — R293 Abnormal posture: Secondary | ICD-10-CM | POA: Diagnosis not present

## 2020-11-21 NOTE — Therapy (Signed)
[facility] [address] [place], [name], [address] Phone: [phone]   Fax:  [phone]  Physical Therapy Treatment  Patient Details  Name: Ronald Noble MRN: [id number] Date of Birth: 12/18/1945 Referring Provider (PT): Eldridge Abrahams   Encounter Date: 11/21/2020   PT End of Session - 11/21/20 0804     Visit Number 4    Number of Visits 13    Date for PT Re-Evaluation 12/13/20    Authorization Type Medicare & BCBS    PT Start Time 0804    PT Stop Time 0848    PT Time Calculation (min) 44 min    Activity Tolerance Patient tolerated treatment well    Behavior During Therapy [facility] for tasks assessed/performed             Past Medical History:  Diagnosis Date   BPH (benign prostatic hyperplasia)    COVID 04/24/2020   scratchy throat x 1 day all symptoms resolved   Parkinson disease ([place])    Skin cancer of face    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   Sleep apnea    mild no cpap used   Urethral stricture     Past Surgical History:  Procedure Laterality Date   BURR HOLE W/ STEREOTACTIC INSERTION OF DBS LEADS / INTRAOP MICROELECTRODE RECORDING  04/2014   [facility] in [facility]   COLONOSCOPY  2019   CYSTOSCOPY WITH URETHRAL DILATATION N/A 06/15/2019   Procedure: CYSTOSCOPY WITH URETHRAL DILATATION;  Surgeon: Franchot Gallo, MD;  Location: [facility];  Service: Urology;  Laterality: N/A;  [place] N/A 05/09/2020   Procedure: CYSTOSCOPY WITH BALLOON DILATATION OF POSTERIOR URETHRAL STRICTURE, FLUOROSCOPIC INTERPRETATION;  Surgeon: Franchot Gallo, MD;  Location: [facility];  Service: Urology;  Laterality: N/A;   KNEE SURGERY  yrs ago   exploratory/left   SKIN CANCER EXCISION  01/2014   face   TRANSURETHRAL RESECTION OF PROSTATE     2008    There were no vitals filed for this visit.    Subjective Assessment - 11/21/20 0805     Subjective Has been doing exercises prescribed at previous session.  Has also been back to swimming.  Had one fall this morning, went to stand up quickly and slid off of the front of the chair.  Scraped R knee.  L hip and R shoulder are a little sore from swimming.    Pertinent History PD 2008, bilateral DBS (2018); hyperlipidemia    Patient Stated Goals Want to stop the falls.    Currently in Pain? Yes    Pain Score 5                                [facility] Adult PT Treatment/Exercise - 11/21/20 0910       Ambulation/Gait   Ambulation/Gait Yes    Ambulation/Gait Assistance 5: Supervision    Ambulation/Gait Assistance Details ongoing verbal cues for arm swing and upright chest, pt demonstrated increased step/stride length and heel strike.    Ambulation Distance (Feet) 230 Feet    Assistive device None    Gait Pattern Step-through pattern;Decreased arm swing - right;Decreased arm swing - left;Decreased trunk rotation;Trunk flexed    Ambulation Surface Level;Indoor               PWR [facility]) - 11/21/20 [id number]  PWR! exercises Moves in [place]! Up x 10    PWR! Rock x 10    PWR! Twist x 10 each side    PWR! Step x 10    Comments Cues for increased hip extension and bigger opening with UE and through chest.  When performing rotation, cues through trunk for increased rotation              Balance Exercises - 11/21/20 0833       Balance Exercises: Standing   Sit to Stand Standard surface;Without upper extremity support;Limitations    Sit to Stand Limitations Against resistance of black theraband pulling backwards with focus on increased hip extension when standing and slow, controlled descent back into sitting.  Performed 10 reps of sit > stand with controlled step and weight shift forwards against resistance with controlled return to bilat stance > sitting.  Greater difficulty shifting weight to L to step  with R.  Continued sit > stand NMR training at wall with reaching up the wall up and to the L for increased extension through RLE and weight shift to L.  Pt continued to perform with significant lateral lean to R.  Changed to bilat UE on wall leaning forwards in midline with therapist providing facilitation of trunk elongation on R and hip shift to R to bring COG to midline and maintaing during sit <> stand x 5 reps.               PT Education - 11/21/20 0912     Education Details continued to review quadruped PWR! Moves    Person(s) Educated Patient    Methods Explanation;Demonstration    Comprehension Tactile cues required;Verbal cues required              PT Short Term Goals - 11/01/20 1243       PT SHORT TERM GOAL #1   Title Pt will be independent with HEP for improved strength, balance, transfers, and gait.  TARGET 11/29/2020    Time 4    Period Weeks    Status New      PT SHORT TERM GOAL #2   Title Patient will verbalize fall prevention strategies in the home, including tips to reduce hastening of gait, in order to decrease risk of future falls.    Time 4    Period Weeks      PT SHORT TERM GOAL #3   Title Pt will perform at least 8 of 10 reps, minimal to no UE support, with NO retropulsion or posterior lean for imrpoved trasnfer safety and efficiency.    Time 4    Period Weeks    Status New      PT SHORT TERM GOAL #4   Title Pt will improve MiniBESTest score to at least 24/28 for decreased fall risk.    Baseline 21/28    Time 4    Period Weeks    Status New               PT Long Term Goals - 11/01/20 1246       PT LONG TERM GOAL #1   Title Pt will be independent with progression of HEP for improved strength, balance, transfers, and gait.  TARGET 12/13/2020    Time 6    Period Weeks    Status New      PT LONG TERM GOAL #2   Title Pt will verbalize plans for ongoing community fitness upon d/c from PT  to maximize gains made in therapy.    Time 6     Period Weeks    Status New      PT LONG TERM GOAL #3   Title Pt will report at least 50% improvement in (decrease in) hastening episodes at home, for improved functional mobility.    Time 6    Period Weeks    Status New                   Plan - 11/21/20 0912     Clinical Impression Statement Continued to review and provide cues for quadruped PWR! Moves to promote increased hip extension and upright trunk.  Incorporated resistance into sit <> stand and stepping to promote increased hip extension and eccentric control.  Pt continues to require cues for weight shift to L to improve initiation of R step.    Personal Factors and Comorbidities Comorbidity 2    Comorbidities Parkinson's disease, HLD    Examination-Activity Limitations Locomotion Level;Transfers;Stand    Examination-Participation Restrictions Community Activity;Occupation    Stability/Clinical Decision Making Stable/Uncomplicated    Rehab Potential Good    PT Frequency 2x / week    PT Duration 6 weeks   plus eval   PT Treatment/Interventions Therapeutic activities;Therapeutic exercise;Patient/family education;Balance training;Neuromuscular re-education;DME Instruction;Gait training;Functional mobility training    PT Next Visit Plan Tips to reduce hastening wtih gait, PWR! Moves (especially PWR! Up and PWR! Step for posture, balance-on Airex for balance challenge); can consider trial of cane/device to help with safety/stability with gait at end of day.  Resisted standing and gait.    Consulted and Agree with Plan of Care Patient             Patient will benefit from skilled therapeutic intervention in order to improve the following deficits and impairments:  Abnormal gait, Decreased balance, Decreased coordination, Decreased range of motion, Decreased mobility, Decreased strength, Difficulty walking, Postural dysfunction, Decreased activity tolerance, Pain  Visit Diagnosis: Abnormal posture  Unsteadiness on  feet  Other abnormalities of gait and mobility  Other symptoms and signs involving the nervous system  Muscle weakness (generalized)     Problem List Patient Active Problem List   Diagnosis Date Noted   Hyperlipidemia 10/11/2020   Bilateral olecranon bursitis 07/05/2019   Other bursitis of knee, left knee 12/06/2018   Pain in right leg 12/06/2018   Parkinson disease ([place])     Rico Junker, PT, DPT 11/21/20    9:17 AM    [facility] [address] [place], [name], [address] Phone: [phone]   Fax:  [phone]  Name: Demetrice Legleiter MRN: [id number] Date of Birth: 09/14/1945

## 2020-11-26 ENCOUNTER — Other Ambulatory Visit: Payer: Self-pay

## 2020-11-26 ENCOUNTER — Ambulatory Visit (INDEPENDENT_AMBULATORY_CARE_PROVIDER_SITE_OTHER): Payer: Medicare Other | Admitting: Neurology

## 2020-11-26 ENCOUNTER — Encounter: Payer: Self-pay | Admitting: Neurology

## 2020-11-26 ENCOUNTER — Ambulatory Visit: Payer: Medicare Other | Admitting: Physical Therapy

## 2020-11-26 VITALS — BP 105/61 | HR 69 | Ht 71.0 in | Wt 153.6 lb

## 2020-11-26 DIAGNOSIS — G249 Dystonia, unspecified: Secondary | ICD-10-CM

## 2020-11-26 DIAGNOSIS — G4752 REM sleep behavior disorder: Secondary | ICD-10-CM

## 2020-11-26 DIAGNOSIS — G20 Parkinson's disease: Secondary | ICD-10-CM

## 2020-11-26 DIAGNOSIS — G479 Sleep disorder, unspecified: Secondary | ICD-10-CM

## 2020-11-26 DIAGNOSIS — Z9689 Presence of other specified functional implants: Secondary | ICD-10-CM | POA: Diagnosis not present

## 2020-11-26 MED ORDER — CARBIDOPA-LEVODOPA 25-100 MG PO TABS: ORAL_TABLET | ORAL | 5 refills | Status: DC

## 2020-11-26 MED ORDER — ROPINIROLE HCL ER 4 MG PO TB24: 4.0000 mg | ORAL_TABLET | Freq: Every day | ORAL | 5 refills | Status: DC

## 2020-11-26 NOTE — Progress Notes (Signed)
Subjective:    Patient ID: Ronald Noble is a 75 y.o. male.  HPI    Interim history:   Ronald Noble is a 75 year old right-handed gentleman with an underlying medical history of skin cancer including melanoma, enlarged prostate (s/p TURP), who presents for followup consultation of his R sided predominant Parkinson's disease, complicated by RBD and sleep disturbance as well as mild dyskinesias, with s/p bilateral DBS placements at [facility], s/p L IPG replacement on the L in 09/2018. He is accompanied by his wife again today. I last saw him on 05/28/2020, at which time he reported that he was taking levodopa 1-1/2 pills 5 times a day but he would also add half pills at times with half a pill of levodopa every 1/2 hour in the first part of the day.  He was advised to continue with Sinemet 1 pill every 2 hours for total of 7 doses.  He was advised to continue with amantadine 100 mg twice daily, clonazepam at night, ropinirole long-acting and melatonin in the evening as well.  They had a recent death in the family, it was unexpected.  He had an interim appointment with [name] in June 2022.  Today, 11/26/2020: He reports that dyskinesias in his face including eyelid spasms are bothersome.  His wife also has had concerns about his imbalance and difficulty with control of his walking speed.  He has been referred to physical therapy at neuro rehab by [name].  He has had some therapy already.  He had interim Botox injections for blepharospasm, no recent programming changes.  He has not really implemented those changes we discussed last time for his levodopa, continues to take half pill increments particularly in the morning.  He feels that the long-acting Requip also adds to his dyskinesias but it helps his restless leg symptoms.  The patient's allergies, current medications, family history, past medical history, past social history, past surgical history and problem list were reviewed and updated  as appropriate.    Previously (copied from previous notes for reference):      I saw him on 11/23/2019, at which time he felt fairly stable.  He had one fall as he slipped on wet floor carrying groceries but thankfully did not have any injuries.  He voiced concern about his sister who was having trouble with her Parkinson's disease and we discussed this stressor at the time.  He had discontinued Botox injections at [facility] for his blepharospasm.  He was advised to increase his clonazepam to 0.5 mg at bedtime due to increase in dream enactment behavior.   He had an interim appointment with Elbert Ewings on 03/06/2020.  She recently requested to resume Botox injections at [facility].   He had an interim appointment with Dr. Deboraha Sprang at [facility] on 09/20/19, next appointment in September and an appointment with Elbert Ewings in October.    I saw him on 05/03/2019, at which time he reported a problem with his lower back and right knee.  He had to use a cane for a while after a fall in the summer.  He had physical therapy and also a steroid injection in November 2020.  He was overall stable.  He was advised to follow-up routinely in 6 months and continue with his medications.   He emailed in the interim with concerns regarding his sisters Parkinson's disease and associated delusions and hallucinations.     I saw him on 11/01/2018, at which time he reported more problems with  his balance and difficulty with freezing and also difficulty stopping when needed.  He had interim left IPG replacement on 09/23/2018.  He has been receiving Botox injections for his blepharospasm.  He did report a fall and bumped his right hip.  He had an x-ray which was negative for any bony injuries.  He was advised to monitor his hip pain.  He was advised to continue with his Parkinson's medications.    I saw him on 04/28/2018, at which time he reported doing well, had no falls, weight has been stable.  I suggested he keep his  medications the same.       I saw him on 10/26/2017, at which time he reported doing okay, tried Gocovri for one month, but it was cost prohibitive. He was back on amantadine 100 mg bid. He tried the reduced dose of amantadine but did not do well on the lower dose. He reported no recent falls. Was getting Botox injections for blepharospasm through [name]. He was on Sinemet 1-1/2 pills 5 times a day, long-acting Requip once daily at 5 PM. I asked him to continue with his medication regimen.   I saw him on 04/28/2017, at which time he reported some daytime somnolence. He was supposed to get cataract surgeries and colonoscopy done. He had done well after Botox injections at [facility]. He was on amantadine twice daily and I suggested we reduce it to once daily and consider taking him off altogether. I suggested he take his long-acting Requip 6 mg once daily in the evening, instead of during the day. We kept his Sinemet at 1-1/2 pills 5 times a day.     I saw him on 10/29/2016, at which time he reported doing okay, he had no recent falls, his left eye ptosis improved after adjustment of his DBS. He did not end up having any Botox injections to his eyelid. He was on Sinemet 1-1/2 pills 5 times a day, 3 hourly intervals, starting approximately at 8 AM. He was on amantadine twice daily and Requip XL around 1 PM.    I saw him on 04/09/2016, at which time he was doing okay, he had improvement in his blood first specimen on the left in facial dyskinesias. He had been followed at [facility]. He was started on amantadine and was taking it twice daily. He was off of Stalevo completely. He felt that his abdominal cramping and facial dyskinesias improved after he came off of Stalevo altogether. He was in the process of being scheduled for Botox injections for his blepharospasm at [facility]. He was still on once daily long-acting Requip, when he tried to stop it completely he noticed a flareup of his RLS. He  denied any recent falls. He was exercising regularly. He did have some constipation but generally under control with some regularity in his bowel movements reported.    I saw him on 07/09/2015, at which time he reported no recent syncope. He had a tilt table test in January 2017 which showed abnormal findings but minimal symptoms reported. He saw Dr. Hartford Poli with vascular surgery for consultation on 05/28/2015. He had a 24-hour blood pressure monitor. He had a follow-up appointment pending with his vascular specialist. He also had an appointment pending with [name] in October 2017. He felt he pulled a muscle in his upper back on the right. He was on Stalevo about 4 times a day, sometimes 5 times a day depending on his day to day activities. He  was on long-acting Requip 6 mg once daily. He was sleeping a little better. He had been advised to start using compression stockings but had not started yet. He had noticed worsening of droopy eyelid. He mailed in December 2017 Re: Blepharospasm and treatment for this. He had been seeing an ophthalmologist.   I saw him on 03/11/2015, at which time he reported a recent fall in September 2016. He had just come back from a road trip and after being in the car for about 2-1/2 hours he got out of the car and fell to the ground for his wife even realize what happened. He fell on pavement and chipped his tooth. They were with some friends who helped. He had to have a crown placed over his chipped tooth. His DBS settings were adjusted when he had his follow-up at [facility]. His orthostatic blood pressure values were stable. Since his settings were changed he had no other syncopal spell. He did have some medication changes as well. He was no longer on clonazepam. Stalevo was 100 mg strength 4-5 times a day. Requip long-acting was 6 mg once daily. She was on long-acting melatonin 6 mg each night. He was advised to drink 4 bottles of water. He was scheduled for tilt  table test as well. His wife was concerned about him climbing on ladders. We talked about gait safety and syncope risk. He was advised not to climb on any ladders or work at heights.    I saw him on 11/07/2014, at which time he reported doing reasonably well. He had nasal congestion after his fall and injury. Of note, unfortunately, he had a syncopal spell on 10/29/2014 and hit his nose as he fell and sustained a nasal fracture. I reviewed the emergency room records. He was referred to ENT. His laceration was repaired with Dermabond. He reported that he stood up and felt lightheaded and then passed out. He fell and hit his face. He had a CT head without contrast as well as maxillofacial CT without contrast on 10/29/2014 which showed: RIGHT and LEFT nasal bone fractures. Small displaced fracture fragment on the LEFT. No skull fracture or intracranial hemorrhage. Unremarkable appearing deep brain stimulator leads. He fell at work. He had taken Levitra at night and 2 days prior he was working hard in the yard. He is notoriously not drinking enough water he admits. He had taken his Stalevo in the morning and was due for his next dose and became lightheaded when he got up quickly from his desk. He had been sitting for about 45 minutes consistently at his desk. He did have a warning sign and kept walking instead of sitting down again. He had a follow-up appointment at [facility] on 11/02/2014 and his Requip was increased slightly again. He had re-programming of his DBS as well. He had reduced the Requip XL to 3 mg twice daily but this was increased to 4 mg twice daily. His Stalevo was at 4-6 pills daily depending on his work day. He does get sleepy at night around 9:30 and often falls asleep while watching TV. He tried Melatonin long-acting but in combination with clonazepam at night it did not work well and in fact he had insomnia. He tried to reduce his clonazepam to 0.25 mg each night but increased it back to 0.5 mg  each night. For his nose fracture he saw Dr. Redmond Baseman and was scheduled for surgery. I did not change his medications. I asked him to drink  more water and reminded him to change positions slowly.   I saw him on 08/07/2014, at which time he reported doing quite well after his DBS placement. His wife felt that he was doing great. He was eating well. He was not drinking enough water. He had trace edema in his distal lower extremities. He had 2 appointments for DBS programming. His Stalevo was reduced from 6 pills a day to 4 pills a day but then he increased it back to 5 pills a day. He was off generic Sinemet. He was on Azilect once daily and Requip long-acting 8 mg twice daily.    He's been back to work. He is currently on Azilect once daily, Requip XL 8 mg twice daily, Stalevo 100 mg strength one pill 5 times a day and he has stopped the carbidopa-levodopa. I reviewed his outside records from Dr. Salomon Fick at [facility] neurosurgery. Patient had bilateral STN DBS electrodes placed on 06/01/2014. He had pulse generators placed approximately 3 weeks after that, 06/13/14.   I saw him on 02/01/2014, at which time he reported difficulty staying asleep. He had not consistently tried melatonin. He had problems with urinary urgency and was advised to try Kegel exercises by his urologist. His PCP talked to an infectious diseases specialist and it was determined he did not actually have Lyme disease.    He did not show for an appointment on 01/30/2014 - he genuinely forgot!   I saw him on 12/19/2013, at which time he reported that his medication was not working as well. He reported more freezing episodes and more off time. Of note, he had been using a new protein milkshake that he was drinking first thing in the morning. He had lost some weight and in an effort to improve his weight loss he had started this new supplements. His primary care physician had referred him to a nutritionist. We  also talked about DBS evaluation and he requested to see another specialist for DBS candidacy. I referred him to [name] at [facility] I also advised him to discontinue using the new protein supplement as I felt it may be interfering with the absorption of his Stalevo. I also suggested a trial of Parcopa half a pill 3 times a day at 11, 2 PM and 7 PM and we kept the Stalevo the same. He was advised to use MiraLAX as needed for constipation.   I saw him on 07/31/13, at which time [name], including swimming, yoga, and weights. He has been on Stalevo 100 mg every 2 1/2 hours starting at 7:30 AM for 6 doses. He stopped clonazepam, as he felt some daytime sluggishness. He has been taking C/L 1/2 tid in between. He had no cognitive issues, but did have some "downtime" midday. I did not change his medications. He had an appointment with [name] at [facility] in June, but there was a mix up with his appointment and he does not have an appointment until next year.   In the interim, he was diagnosed with Lyme d/s in July. He was tested and treated by his PCP has has been referred to an infectious d/s doctor. He brings in his latest lying test results from 12/05/2013 which I reviewed: It looks like his IgM antibody status is negative but his IgG antibody status has not quite turn positive as far as I understand the report. That means he is probably in  the subacute or early chronic phase if I interpret this correctly. He requested a sooner than scheduled appointment, due to feeling more freezing and more off time. This starts in the late morning hours but can also go into the afternoon after lunch. Cognitively and mood wise he feels stable. He does not sleep well at night. He has not consistently tried melatonin I recommended.   I saw him on 01/30/2013, at which time I felt he was stable. We talked about DBS surgery. I did not make any medication changes. He is  aware of the increase risk of melanoma associated with DAs and his dermatologist is aware that he is taking Requip XL.   I first met him on 09/27/2012, which time I did not make any changes to his medication and did not order any new test as he was stable. He saw [name] at [facility] and discussed DBS in the past but was not deemed a cadidate for it at the time as he was doing well. He was told by [name] to stay on schedule with his medication. 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 Sleep has been an issue. He goes to sleep well, but wakes up after 45 minutes to 1 hour. He is seeing a urologist. He exercises regularly.   He previously followed with [name] and was last seen by him on 05/25/2012, at which time [name] felt he was stable, but because of off time at 2 hours he increased his carbidopa-levodopa by adding half of a 10-100 mg strength 3 times a day to his regimen. He recommended decreasing his Stalevo to 125 mg from 100 mg 6 times a day. He has been on Stalevo 125 mg 6 times a day, at 7:30, 10, 12:30, 3 PM, 5:30 PM and 8 PM. He is on clonazepam 0.5 mg at night, Requip long-acting 6 mg twice daily, rasagiline 1 mg once daily, carbidopa-levodopa 10-100 mg strength half a tablet 3 times a day, 7:30 to 10, then midday and later in the evening.   He was followed by [name] since 2008 with a history of Parkinson's disease. He has a history of increased tone and slowness as well as tremor with R sided predominance. He was initially placed on Requip XL and then rasagiline was added in October 2008. In February 2010 Stalevo was added. He had low vitamin D levels in 2008. ANA, ESR, ceruloplasmin were normal except for slightly low ceruloplasmin of 16. Urine copper test was negative. Methylmalonic acid was normal. Urine for heavy metals is normal as well. MRI brain with and without contrast on 01/07/2007 was normal. He had a sleep study in October 2008 showing PLMs and evidence  of RBD. Repeat sleep study in September 2011 showed supine obstructive sleep apnea and RBD. He had a CPAP titration study in late September 2011 which was not very successful. He has been on clonazepam at night. He has not noticed any compulsive behaviors, memory loss, depression, swelling, or orthostatic dizziness. He recently had a skin biopsy showing atypical cells, concerning for melanoma.   He works full-time as an Forensic psychologist, [employer]. He exercises regularly and does yoga.    His Past Medical History Is Significant For: Past Medical History:  Diagnosis Date   BPH (benign prostatic hyperplasia)    COVID 04/24/2020   scratchy throat x 1 day all symptoms resolved   Parkinson disease ([place])    Skin cancer  of face    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   Sleep apnea    mild no cpap used   Urethral stricture     His Past Surgical History Is Significant For: Past Surgical History:  Procedure Laterality Date   BURR HOLE W/ STEREOTACTIC INSERTION OF DBS LEADS / INTRAOP MICROELECTRODE RECORDING  04/2014   [facility], new leads replaced in 2020   COLONOSCOPY  2019   [place] N/A 06/15/2019   Procedure: CYSTOSCOPY WITH URETHRAL DILATATION;  Surgeon: Franchot Gallo, MD;  Location: [facility];  Service: Urology;  Laterality: N/A;  [place] N/A 05/09/2020   Procedure: CYSTOSCOPY WITH BALLOON DILATATION OF POSTERIOR URETHRAL STRICTURE, FLUOROSCOPIC INTERPRETATION;  Surgeon: Franchot Gallo, MD;  Location: [facility];  Service: Urology;  Laterality: N/A;   KNEE SURGERY  yrs ago   exploratory/left   SKIN CANCER EXCISION  01/2014   face   TRANSURETHRAL RESECTION OF PROSTATE     2008    His Family History Is Significant For: Family History  Problem Relation Age of Onset   COPD Mother    Cancer Father        Bladder   Colon cancer Neg Hx    Esophageal cancer Neg Hx     Stomach cancer Neg Hx     His Social History Is Significant For: Social History   Socioeconomic History   Marital status: Married    Spouse name: Levada Dy   Number of children: 4   Years of education: 47   Highest education level: Not on file  Occupational History   Occupation: LAWYER    Employer: [employer]  Tobacco Use   Smoking status: Never   Smokeless tobacco: Never  Vaping Use   Vaping Use: Never used  Substance and Sexual Activity   Alcohol use: Yes    Alcohol/week: 5.0 standard drinks    Types: 3 Glasses of wine, 2 Cans of beer per week    Comment: occasional   Drug use: No   Sexual activity: Not on file  Other Topics Concern   Not on file  Social History Narrative   Patient is right handed and resides with wife   1-2 cups of coffee a day    Social Determinants of Health   Financial Resource Strain: Not on file  Food Insecurity: Not on file  Transportation Needs: Not on file  Physical Activity: Not on file  Stress: Not on file  Social Connections: Not on file    His Allergies Are:  Allergies  Allergen Reactions   Rosuvastatin     myalgias  :   His Current Medications Are:  Outpatient Encounter Medications as of 11/26/2020  Medication Sig   amantadine (SYMMETREL) 100 MG capsule 2 (two) times daily.    aspirin EC 81 MG tablet Take 1 tablet (81 mg total) by mouth daily. Swallow whole.   atorvastatin (LIPITOR) 10 MG tablet Take 1 tablet (10 mg total) by mouth daily.   carbidopa-levodopa (SINEMET IR) 25-100 MG tablet Take 1.5 tablets by mouth 5 (five) times daily.   cholecalciferol (VITAMIN D3) 25 MCG (1000 UNIT) tablet Take 2,000 Units by mouth daily.   clonazePAM (KLONOPIN) 0.25 MG disintegrating tablet Take 2 tablets (0.5 mg total) by mouth at bedtime.   Coenzyme Q10 (COQ10) 200 MG CAPS Take 200 mg by mouth daily.   ibandronate (BONIVA) 150 MG tablet Take 150 mg by  mouth every 30 (thirty) days. Take in the morning with a full glass of water,  on an empty stomach, and do not take anything else by mouth or lie down for the next 30 min.   Melatonin 5 MG SUBL Place under the tongue.   Ropinirole HCl 6 MG TB24 daily. 1 tablet at 5 pm   sildenafil (REVATIO) 20 MG tablet Take 20 mg by mouth as needed.   No facility-administered encounter medications on file as of 11/26/2020.  :  Review of Systems:  Out of a complete 14 point review of systems, all are reviewed and negative with the exception of these symptoms as listed below:  Review of Systems  Neurological:        Pt is here for 6 month follow up . Pt wife  states his gait is worsening and he is during Neuro PT  Dr Linus Mako ordered for patient   Objective:  Neurological Exam  Physical Exam Physical Examination:   Vitals:   11/26/20 1326  BP: 105/61  Pulse: 69    General Examination: The patient is a very pleasant 75 y.o. male in no acute distress. He appears well-developed and well-nourished and well groomed.   HEENT: Normocephalic, atraumatic, pupils are equal, round and reactive to light, extraocular tracking is mildly impaired, with significant blepharospasm, and significant today.  Neck is mildly rigid.  Airway examination shows stable findings, mild excess moisture. His stimulator wires are tunneled on the left side.    Chest: is clear to auscultation without wheezing, rhonchi or crackles noted.    Heart: sounds are regular and normal without murmurs, rubs or gallops noted.    Abdomen: is soft, non-tender and non-distended with normal bowel sounds appreciated on auscultation.   Extremities: There is no obvious edema in the distal lower extremities bilaterally.    Skin: is warm and dry with no trophic changes noted. Age-related changes are noted on the skin and scars from skin cancer removals.    Musculoskeletal: exam reveals no obvious joint deformities. Upper body tilt to R seems to be fairly stable.   Neurologically:  Mental status: The patient is awake and  alert, paying good attention. He is able to completely provide the history. He is oriented to: person, place, time/date, situation, day of week, month of year and year. His memory, attention, language and knowledge are appropriate. There is no aphasia, agnosia, apraxia or anomia. There is a mild degree of bradyphrenia. Speech is mild to moderately hypophonic with minimal dysarthria noted. Mood is congruent and affect is normal.    Cranial nerves are as described above under HEENT exam. Motor exam: Normal bulk, and strength for age is noted. There are mild upper body dyskinesias noted today. No resting tremor.  Tone is mild to moderately rigid with presence of cogwheeling in the right upper extremity. There is overall mild to moderate bradykinesia. There is no drift or rebound.    Fine motor skills exam: Fine motor skills are mild to moderately impaired, more pronounced on the R than L, but stable.  Cerebellar testing shows no dysmetria or intention tremor. There is no truncal or gait ataxia.  Sensory exam is intact to light touch throughout.  Gait, station and balance: He stands up from the seated position with mild difficulty and does fall back into the chair once.  He has to push himself up with both hands.  He does not require assistance.  He is noted to lean to the right more and posture  is moderately stooped. He walks with decreased arm swing bilaterally. Mild dystonic posturing of the right arm. Balance is mildly impaired.    Assessment and Plan:    In summary, Ronald Noble is a very pleasant 75 year old male with an underlying medical history of skin cancer including melanoma, enlarged prostate (s/p TURP), who presents for followup consultation of his R sided predominant Parkinson's disease, complicated by RBD and sleep disturbance as well as mild dyskinesias. He is s/p bilateral DBS placements at [facility], s/p L IPG replacement on the L in 09/2018. He is On Sinemet, Requip long-acting and  amantadine. He has had orthostatic hypotension, syncopal spells, falls with injuries in the past, constipation and bladder hyperactivity, and now s/p b/l DBS placement at [facility] on 06/01/14 and generator placement on 06/13/2014. Left-sided battery replacement/generator replacement in June 2020. 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 He has had syncopal spells in the past including falls with injuries.  We had talked about melanoma and taking Parkinson's medicine concomitantly at length before.  He was agreeable to monitoring for skin cancer with his dermatologist. He sustained Fx of nose when he fell in his office, and then also fell in his driveway and chipped a tooth, needed a crown placed. He had to have nose surgery. He saw a vascular specialist and had a tilt table test at [facility]. He had a 24-hour blood pressure monitor test. He was started on amantadine for dyskinesias. He had a one month trial of Gocovri, but a Rx was cost prohibitive and he went back to amantadine twice a day. He tried to reduce it to once daily but found it not as effective. He has been getting regular Botox injection for blepharospasm at [facility].  He used to be on Stalevo in the past but this was stopped in 2018.  He has had intermittent facial dyskinesias, more noticeable today.  He has been taking his levodopa in half pill increments at times in the morning.  Today, he is advised to reduce his long-acting Requip to 4 mg once daily.  He is advised to take Sinemet 25-100 mg strength 1 pill 7 times a day in 2-hour intervals, starting at 8 AM. He can continue with amantadine 100 mg twice daily, clonazepam 0.25 mg, 2 pills in the evening and melatonin 3 mg before bedtime.  I answered all the questions today and the patient and his wife were in agreement. I spent 40 minutes in total face-to-face time and in reviewing records during  pre-charting, more than 50% of which was spent in counseling and coordination of care, reviewing test results, reviewing medications and treatment regimen and/or in discussing or reviewing the diagnosis of PD, the prognosis and treatment options. Pertinent laboratory and imaging test results that were available during this visit with the patient were reviewed by me and considered in my medical decision making (see chart for details).

## 2020-11-26 NOTE — Patient Instructions (Signed)
It was nice to see you both today.  As discussed, I recommend, you take your Sinemet/levodopa 1 pill every 2 hours starting at 8 AM.  You will take it therefore at 8 AM, 10 AM, noon, 2 PM, 4 PM, 6 PM and 8 PM daily.  We will reduce your ropinirole long-acting from 6 mg daily to 4 mg daily in the hope that you will have less dyskinesia.  Please continue with physical therapy to help with your gait instability.  Follow-up with [name] and see if additional programming is recommended.  We will follow-up with you here in 4 months.

## 2020-11-27 ENCOUNTER — Ambulatory Visit: Payer: Medicare Other | Admitting: Physical Therapy

## 2020-11-27 ENCOUNTER — Encounter: Payer: Self-pay | Admitting: Physical Therapy

## 2020-11-27 DIAGNOSIS — R2681 Unsteadiness on feet: Secondary | ICD-10-CM | POA: Diagnosis not present

## 2020-11-27 DIAGNOSIS — R293 Abnormal posture: Secondary | ICD-10-CM | POA: Diagnosis not present

## 2020-11-27 DIAGNOSIS — M6281 Muscle weakness (generalized): Secondary | ICD-10-CM | POA: Diagnosis not present

## 2020-11-27 DIAGNOSIS — R29818 Other symptoms and signs involving the nervous system: Secondary | ICD-10-CM | POA: Diagnosis not present

## 2020-11-27 DIAGNOSIS — R2689 Other abnormalities of gait and mobility: Secondary | ICD-10-CM | POA: Diagnosis not present

## 2020-11-27 NOTE — Therapy (Signed)
[facility] [address] [place], [name], [address] Phone: [phone]   Fax:  [phone]  Physical Therapy Treatment  Patient Details  Name: Ronald Noble MRN: [id number] Date of Birth: 07-12-1945 Referring Provider (PT): Eldridge Abrahams   Encounter Date: 11/27/2020   PT End of Session - 11/27/20 0957     Visit Number 5    Number of Visits 13    Date for PT Re-Evaluation 12/13/20    Authorization Type Medicare & BCBS    PT Start Time 0802    PT Stop Time 0842    PT Time Calculation (min) 40 min    Activity Tolerance Patient tolerated treatment well    Behavior During Therapy [facility] for tasks assessed/performed             Past Medical History:  Diagnosis Date   BPH (benign prostatic hyperplasia)    COVID 04/24/2020   scratchy throat x 1 day all symptoms resolved   Parkinson disease ([place])    Skin cancer of face    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   Sleep apnea    mild no cpap used   Urethral stricture     Past Surgical History:  Procedure Laterality Date   BURR HOLE W/ STEREOTACTIC INSERTION OF DBS LEADS / INTRAOP MICROELECTRODE RECORDING  04/2014   [facility] in [facility]   COLONOSCOPY  2019   CYSTOSCOPY WITH URETHRAL DILATATION N/A 06/15/2019   Procedure: CYSTOSCOPY WITH URETHRAL DILATATION;  Surgeon: Franchot Gallo, MD;  Location: [facility];  Service: Urology;  Laterality: N/A;  [place] N/A 05/09/2020   Procedure: CYSTOSCOPY WITH BALLOON DILATATION OF POSTERIOR URETHRAL STRICTURE, FLUOROSCOPIC INTERPRETATION;  Surgeon: Franchot Gallo, MD;  Location: [facility];  Service: Urology;  Laterality: N/A;   KNEE SURGERY  yrs ago   exploratory/left   SKIN CANCER EXCISION  01/2014   face   TRANSURETHRAL RESECTION OF PROSTATE     2008    There were no vitals filed for this visit.    Subjective Assessment - 11/27/20 0804     Subjective No new falls. Feels like when he gets tired that is when his walking gets out of control.    Pertinent History PD 2008, bilateral DBS (2018); hyperlipidemia    Patient Stated Goals Want to stop the falls.    Currently in Pain? No/denies                               [facility] Adult PT Treatment/Exercise - 11/27/20 0818       Transfers   Transfers Sit to Stand;Stand to Sit    Sit to Stand 5: Supervision;Without upper extremity assist    Sit to Stand Details Verbal cues for technique;Verbal cues for sequencing    Sit to Stand Details (indicate cue type and reason) cues for PWR up posture and scap retraction once standing, cues to reset once standing with tall posture vs. immediately standing and starting to walk with more forward flexed posture    Stand to Sit 5: Supervision;Without upper extremity assist    Number of Reps 10 reps;2 sets   1 set from mat table, additional set from chair, no retropulsion throughout     Ambulation/Gait   Ambulation/Gait Yes    Ambulation/Gait Assistance 5: Supervision    Ambulation/Gait Assistance Details cues for posture,  heel strike and incr foot clearance. pt did demo 2 episodes of beginning to slightly speed up gait and decr foot clearance. reviewed technique with pt to decr hastening with gait to STOP, reset with tall PWR Up posture and widen BOS before continuing with gait again.    Ambulation Distance (Feet) 345 Feet    Assistive device None    Gait Pattern Step-through pattern;Decreased arm swing - right;Decreased arm swing - left;Decreased trunk rotation;Trunk flexed    Ambulation Surface Level;Indoor    Gait Comments resisted forward gait at countertop with use of black tband around pelvis, x6 reps total of 20',with initial rep pt speeding up and going off to the side and needing mod A to regain balance. pt needs cues throughout to [facility] Up and for slowed pace and using countertop  for balance. additional cues for pressing hips into band for hip extension               PWR [facility]) - 11/27/20 0840     PWR! Up x10 reps, initial cues for proper technique, used mirror as visual cue for posture    PWR! Up x10 reps reviewed from HEP as pt has just been performing in standing, cues for scap retraction and holding for a couple seconds for incr postural awareness               Balance Exercises - 11/27/20 0834       Balance Exercises: Standing   Stepping Strategy Anterior;Lateral;Limitations    Stepping Strategy Limitations with use of black resistance around pelvis; x10 reps B lateral stepping, x10 reps B alternating forward stepping with BUE support in // bars and use of mirror for posture. cues to make sure pt maintains space between his feet for wider BOS for balance vs. keeping feet together               PT Education - 11/27/20 0957     Education Details getting scheduled for additional appts within POC    Person(s) Educated Patient    Methods Explanation    Comprehension Verbalized understanding              PT Short Term Goals - 11/27/20 0958       PT SHORT TERM GOAL #1   Title Pt will be independent with HEP for improved strength, balance, transfers, and gait.  TARGET 11/29/2020    Time 4    Period Weeks    Status New      PT SHORT TERM GOAL #2   Title Patient will verbalize fall prevention strategies in the home, including tips to reduce hastening of gait, in order to decrease risk of future falls.    Time 4    Period Weeks      PT SHORT TERM GOAL #3   Title Pt will perform at least 8 of 10 reps, minimal to no UE support, with NO retropulsion or posterior lean for imrpoved trasnfer safety and efficiency.    Baseline performed x10 reps from chair and an additional x10 reps from mat table with no UE support and no retropulsion.    Time 4    Period Weeks    Status Achieved      PT SHORT TERM GOAL #4   Title Pt will improve  MiniBESTest score to at least 24/28 for decreased fall risk.    Baseline 21/28    Time 4    Period Weeks    Status New  PT Long Term Goals - 11/01/20 1246       PT LONG TERM GOAL #1   Title Pt will be independent with progression of HEP for improved strength, balance, transfers, and gait.  TARGET 12/13/2020    Time 6    Period Weeks    Status New      PT LONG TERM GOAL #2   Title Pt will verbalize plans for ongoing community fitness upon d/c from PT to maximize gains made in therapy.    Time 6    Period Weeks    Status New      PT LONG TERM GOAL #3   Title Pt will report at least 50% improvement in (decrease in) hastening episodes at home, for improved functional mobility.    Time 6    Period Weeks    Status New                   Plan - 11/27/20 7893     Clinical Impression Statement Began to check pt's STGs today. Pt met STG #3 - able to perform 2 sets of 10 reps of sit <> stands with no UE support with no episodes of retropulsion and proper technique. Pt does need cues before standing to walk, to stand and perform PWR Up for improved posture before walking. Continued to perform forward resisted gait - with pt needing cues throughout for slowed pace. Intiaited standing stepping strategies with black tband resistance around therapist with cues for wider BOS upon return to midline. Will continue to progress towards LTGs.    Personal Factors and Comorbidities Comorbidity 2    Comorbidities Parkinson's disease, HLD    Examination-Activity Limitations Locomotion Level;Transfers;Stand    Examination-Participation Restrictions Community Activity;Occupation    Stability/Clinical Decision Making Stable/Uncomplicated    Rehab Potential Good    PT Frequency 2x / week    PT Duration 6 weeks   plus eval   PT Treatment/Interventions Therapeutic activities;Therapeutic exercise;Patient/family education;Balance training;Neuromuscular re-education;DME  Instruction;Gait training;Functional mobility training    PT Next Visit Plan check remainder of STGs. review tips to reduce hastening wtih gait and to PWR up before walking, PWR! Moves (especially PWR! Up and PWR! Step for posture, balance-on Airex for balance challenge); can consider trial of cane/device to help with safety/stability with gait at end of day.  Resisted standing and gait.    Consulted and Agree with Plan of Care Patient             Patient will benefit from skilled therapeutic intervention in order to improve the following deficits and impairments:  Abnormal gait, Decreased balance, Decreased coordination, Decreased range of motion, Decreased mobility, Decreased strength, Difficulty walking, Postural dysfunction, Decreased activity tolerance, Pain  Visit Diagnosis: Abnormal posture  Unsteadiness on feet  Other abnormalities of gait and mobility     Problem List Patient Active Problem List   Diagnosis Date Noted   Hyperlipidemia 10/11/2020   Bilateral olecranon bursitis 07/05/2019   Other bursitis of knee, left knee 12/06/2018   Pain in right leg 12/06/2018   Parkinson disease ([place])     Arliss Journey, PT, DPT  11/27/2020, 10:07 AM  [facility] [address] [place], [name], [address] Phone: [phone]   Fax:  [phone]  Name: Ronald Noble MRN: [id number] Date of Birth: 25-Nov-1945

## 2020-11-28 ENCOUNTER — Ambulatory Visit: Payer: Medicare Other | Admitting: Physical Therapy

## 2020-11-28 ENCOUNTER — Other Ambulatory Visit: Payer: Self-pay

## 2020-11-28 ENCOUNTER — Encounter: Payer: Self-pay | Admitting: Physical Therapy

## 2020-11-28 DIAGNOSIS — R2681 Unsteadiness on feet: Secondary | ICD-10-CM

## 2020-11-28 DIAGNOSIS — R2689 Other abnormalities of gait and mobility: Secondary | ICD-10-CM | POA: Diagnosis not present

## 2020-11-28 DIAGNOSIS — R29818 Other symptoms and signs involving the nervous system: Secondary | ICD-10-CM | POA: Diagnosis not present

## 2020-11-28 DIAGNOSIS — R293 Abnormal posture: Secondary | ICD-10-CM | POA: Diagnosis not present

## 2020-11-28 DIAGNOSIS — M6281 Muscle weakness (generalized): Secondary | ICD-10-CM | POA: Diagnosis not present

## 2020-11-28 NOTE — Patient Instructions (Addendum)
Tips to reduce freezing episodes with standing or walking:  Stand tall with your feet wide, so that you can rock and weight shift through your hips. Don't try to fight the freeze: if you begin taking slower, faster, smaller steps, STOP, get your posture tall, and RESET your posture and balance.  Take a deep breath before taking the BIG step to start again. March in place, with high knee stepping, to get started walking again. Use auditory cues:  Count out loud, think of a familiar tune or song or cadence, use pocket metronome, to use rhythm to get started walking again. Use visual cues:  Use a line to step over, use laser pointer line to step over, (using BIG steps) to start walking again. Use visual targets to keep your posture tall (look ahead and focus on an object or target at eye level). As you approach where your destination with walking, count your steps out loud and/or focus on your target with your eyes until you are fully there. Use appropriate assistive device, as advised by your physical therapist to assist with taking longer, consistent steps.    Access Code: [email or web address] URL: [email or web address] Date: 11/28/2020 Prepared by: Mady Haagensen  Exercises Alternating Step Forward with Support - 1 x daily - 5 x weekly - 2 sets - 10 reps

## 2020-11-29 NOTE — Therapy (Signed)
[facility] [address] [place], [name], [address] Phone: [phone]   Fax:  [phone]  Physical Therapy Treatment  Patient Details  Name: Ronald Noble MRN: [id number] Date of Birth: 1945-11-15 Referring Provider (PT): Eldridge Abrahams   Encounter Date: 11/28/2020   PT End of Session - 11/28/20 1404     Visit Number 6    Number of Visits 13    Date for PT Re-Evaluation 12/13/20    Authorization Type Medicare & BCBS    PT Start Time 7262    PT Stop Time 0355    PT Time Calculation (min) 41 min    Activity Tolerance Patient tolerated treatment well    Behavior During Therapy [facility] for tasks assessed/performed             Past Medical History:  Diagnosis Date   BPH (benign prostatic hyperplasia)    COVID 04/24/2020   scratchy throat x 1 day all symptoms resolved   Parkinson disease ([place])    Skin cancer of face    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   Sleep apnea    mild no cpap used   Urethral stricture     Past Surgical History:  Procedure Laterality Date   BURR HOLE W/ STEREOTACTIC INSERTION OF DBS LEADS / INTRAOP MICROELECTRODE RECORDING  04/2014   [facility] in [facility]   COLONOSCOPY  2019   CYSTOSCOPY WITH URETHRAL DILATATION N/A 06/15/2019   Procedure: CYSTOSCOPY WITH URETHRAL DILATATION;  Surgeon: Franchot Gallo, MD;  Location: [facility];  Service: Urology;  Laterality: N/A;  [place] N/A 05/09/2020   Procedure: CYSTOSCOPY WITH BALLOON DILATATION OF POSTERIOR URETHRAL STRICTURE, FLUOROSCOPIC INTERPRETATION;  Surgeon: Franchot Gallo, MD;  Location: [facility];  Service: Urology;  Laterality: N/A;   KNEE SURGERY  yrs ago   exploratory/left   SKIN CANCER EXCISION  01/2014   face   TRANSURETHRAL RESECTION OF PROSTATE     2008    There were no vitals filed for this visit.    Subjective Assessment - 11/28/20 1403     Subjective In a rush to get everywhere today.   I need to slow down, I know.  Changed my medication, decreasing ropinerole.  I couldn't sleep last night at all because of my legs moving and jerking.  I plan to go back up on the medication.    Pertinent History PD 2008, bilateral DBS (2018); hyperlipidemia    Patient Stated Goals Want to stop the falls.    Currently in Pain? No/denies                               [facility] Adult PT Treatment/Exercise - 11/28/20 1405       Transfers   Transfers Sit to Stand;Stand to Sit    Sit to Stand 5: Supervision;Without upper extremity assist    Stand to Sit 5: Supervision;Without upper extremity assist      Ambulation/Gait   Ambulation/Gait Yes    Ambulation/Gait Assistance 5: Supervision    Ambulation/Gait Assistance Details Cues for posture initially. One episode at beginning of session and one at end, where pt becomes more forward and needs cues to STOP and reset posture. Later in session, pt responds better to cues for deliberate lifting of feet to help slow pace with gait.    Ambulation Distance (Feet)  80 Feet   x 2, 250, 400   Assistive device None    Gait Pattern Step-through pattern;Decreased arm swing - right;Decreased arm swing - left;Decreased trunk rotation;Trunk flexed    Ambulation Surface Level;Indoor    Gait Comments Pt more consistently maintains upright posture and no episodes of hastening with gait, when his focus is on increaseing step length/height.      Mini-BESTest   Sit To Stand Normal: Comes to stand without use of hands and stabilizes independently.    Rise to Toes Normal: Stable for 3 s with maximum height.    Stand on one leg (left) Moderate: < 20 s   0.65, 2.72   Stand on one leg (right) Moderate: < 20 s   6.72, 4.44   Stand on one leg - lowest score 1    Compensatory Stepping Correction - Forward Normal: Recovers independently with a single, large step  (second realignement is allowed).    Compensatory Stepping Correction - Backward Moderate: More than one step is required to recover equilibrium    Compensatory Stepping Correction - Left Lateral Moderate: Several steps to recover equilibrium    Compensatory Stepping Correction - Right Lateral Moderate: Several steps to recover equilibrium    Stepping Corredtion Lateral - lowest score 1    Stance - Feet together, eyes open, firm surface  Normal: 30s    Stance - Feet together, eyes closed, foam surface  Normal: 30s    Incline - Eyes Closed Normal: Stands independently 30s and aligns with gravity    Change in Gait Speed Normal: Significantly changes walkling speed without imbalance    Walk with head turns - Horizontal Normal: performs head turns with no change in gait speed and good balance    Walk with pivot turns Normal: Turns with feet close FAST (< 3 steps) with good balance.    Step over obstacles Moderate: Steps over box but touches box OR displays cautious behavior by slowing gait.    Timed UP & GO with Dual Task Moderate: Dual Task affects either counting OR walking (>10%) when compared to the TUG without Dual Task.   9.07 sec, 11.16 sec   Mini-BEST total score 23      High Level Balance   High Level Balance Comments Forward step and weightshift with cues for tall posture through stance leg, x 10 reps each, 3 sets; utilized hurdles to step over, with UE support at counter.  Cues to slow pace for increased foot clearance and step length.  Reviewed HEP, with pt not performing seated PWR! Up at home.  He demo sit to stand with  PWR! Up at edge of mat, x 10 reps; standing PWR! Up x 10 reps.      Self-Care   Self-Care Other Self-Care Comments;Posture    Other Self-Care Comments  Reviewed tips to reduce freezing and hastening episodes with gait.            Additional gait 115 ft with single walking pole; pt has difficulty sequencing, is used to using bilateral poles, if he uses them (not  frequently, per report)        PT Education - 11/29/20 1413     Education Details Reviewed tips to reduce hastening with gait; addition to HEP-see instructions    Person(s) Educated Patient    Methods Explanation;Demonstration;Handout    Comprehension Verbalized understanding;Returned demonstration              PT Short Term Goals - 11/28/20 1432  PT SHORT TERM GOAL #1   Title Pt will be independent with HEP for improved strength, balance, transfers, and gait.  TARGET 11/29/2020    Baseline currently doing PWR! Moves stand, sit<>stand every other day    Time 4    Period Weeks    Status Partially Met      PT SHORT TERM GOAL #2   Title Patient will verbalize fall prevention strategies in the home, including tips to reduce hastening of gait, in order to decrease risk of future falls.    Baseline verbalizes 11/28/20    Time 4    Period Weeks    Status Achieved      PT SHORT TERM GOAL #3   Title Pt will perform at least 8 of 10 reps, minimal to no UE support, with NO retropulsion or posterior lean for imrpoved trasnfer safety and efficiency.    Baseline performed x10 reps from chair and an additional x10 reps from mat table with no UE support and no retropulsion.    Time 4    Period Weeks    Status Achieved      PT SHORT TERM GOAL #4   Title Pt will improve MiniBESTest score to at least 24/28 for decreased fall risk.    Baseline 21/28; 23/28 11/28/2020    Time 4    Period Weeks    Status Not Met               PT Long Term Goals - 11/01/20 1246       PT LONG TERM GOAL #1   Title Pt will be independent with progression of HEP for improved strength, balance, transfers, and gait.  TARGET 12/13/2020    Time 6    Period Weeks    Status New      PT LONG TERM GOAL #2   Title Pt will verbalize plans for ongoing community fitness upon d/c from PT to maximize gains made in therapy.    Time 6    Period Weeks    Status New      PT LONG TERM GOAL #3   Title Pt  will report at least 50% improvement in (decrease in) hastening episodes at home, for improved functional mobility.    Time 6    Period Weeks    Status New                   Plan - 11/29/20 1414     Clinical Impression Statement Assessed remaining STGs:  Pt has partially met STG 1 for HEP (doing part, not all of HEP).  Pt has met STG 2 for tips to reduce hastening with gait.  Pt has not met STG for MiniBESTest, but score improved from 21/28 to 23/28, just not to goal level.  Pt responded well to work on deliberate step length/foot clearance using an obstacle, with pt able to have good carryover with foot clearance and slowed, deliberate pace with gait when he is focusing on this.    Personal Factors and Comorbidities Comorbidity 2    Comorbidities Parkinson's disease, HLD    Examination-Activity Limitations Locomotion Level;Transfers;Stand    Examination-Participation Restrictions Community Activity;Occupation    Stability/Clinical Decision Making Stable/Uncomplicated    Rehab Potential Good    PT Frequency 2x / week    PT Duration 6 weeks   plus eval   PT Treatment/Interventions Therapeutic activities;Therapeutic exercise;Patient/family education;Balance training;Neuromuscular re-education;DME Instruction;Gait training;Functional mobility training    PT Next Visit Plan Remind pt  to PWR up before walking, PWR! Moves (especially PWR! Up and PWR! Step for posture, balance-on Airex for balance challenge); review forward step and weightshift added to HEP; resisted standing and gait.    Consulted and Agree with Plan of Care Patient             Patient will benefit from skilled therapeutic intervention in order to improve the following deficits and impairments:  Abnormal gait, Decreased balance, Decreased coordination, Decreased range of motion, Decreased mobility, Decreased strength, Difficulty walking, Postural dysfunction, Decreased activity tolerance, Pain  Visit  Diagnosis: Unsteadiness on feet  Other abnormalities of gait and mobility  Abnormal posture  Other symptoms and signs involving the nervous system     Problem List Patient Active Problem List   Diagnosis Date Noted   Hyperlipidemia 10/11/2020   Bilateral olecranon bursitis 07/05/2019   Other bursitis of knee, left knee 12/06/2018   Pain in right leg 12/06/2018   Parkinson disease ([place])     Isbella Arline W. 11/29/2020, 2:19 PM Frazier Butt., PT   [facility] [address] [place], [name], [address] Phone: [phone]   Fax:  [phone]  Name: Xxavier Noon MRN: [id number] Date of Birth: [birth year]

## 2020-12-03 ENCOUNTER — Ambulatory Visit: Payer: Medicare Other | Admitting: Physical Therapy

## 2020-12-03 ENCOUNTER — Other Ambulatory Visit: Payer: Self-pay

## 2020-12-03 DIAGNOSIS — R293 Abnormal posture: Secondary | ICD-10-CM | POA: Diagnosis not present

## 2020-12-03 DIAGNOSIS — R29818 Other symptoms and signs involving the nervous system: Secondary | ICD-10-CM

## 2020-12-03 DIAGNOSIS — M6281 Muscle weakness (generalized): Secondary | ICD-10-CM

## 2020-12-03 DIAGNOSIS — R2689 Other abnormalities of gait and mobility: Secondary | ICD-10-CM | POA: Diagnosis not present

## 2020-12-03 DIAGNOSIS — R2681 Unsteadiness on feet: Secondary | ICD-10-CM | POA: Diagnosis not present

## 2020-12-03 NOTE — Patient Instructions (Signed)
Access Code: [email or web address] URL: [email or web address] Date: 12/03/2020 Prepared by: Mady Haagensen  Exercises Alternating Step Forward with Support - 1 x daily - 5 x weekly - 2 sets - 10 reps Prone Hip Extension - 1 x daily - 5 x weekly - 1-2 sets - 10 reps Prone Hip Extension with Bent Knee - 1 x daily - 5 x weekly - 1-2 sets - 10 reps

## 2020-12-03 NOTE — Therapy (Signed)
[facility] [address] [place], [name], [address] Phone: [phone]   Fax:  [phone]  Physical Therapy Treatment  Patient Details  Name: Ronald Noble MRN: [id number] Date of Birth: Sep 03, 1945 Referring Provider (PT): Eldridge Abrahams   Encounter Date: 12/03/2020   PT End of Session - 12/03/20 0718     Visit Number 7    Number of Visits 13    Date for PT Re-Evaluation 12/13/20    Authorization Type Medicare & BCBS    PT Start Time 0718    PT Stop Time 0800    PT Time Calculation (min) 42 min    Activity Tolerance Patient tolerated treatment well    Behavior During Therapy [facility] for tasks assessed/performed             Past Medical History:  Diagnosis Date   BPH (benign prostatic hyperplasia)    COVID 04/24/2020   scratchy throat x 1 day all symptoms resolved   Parkinson disease ([place])    Skin cancer of face    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   Sleep apnea    mild no cpap used   Urethral stricture     Past Surgical History:  Procedure Laterality Date   BURR HOLE W/ STEREOTACTIC INSERTION OF DBS LEADS / INTRAOP MICROELECTRODE RECORDING  04/2014   [facility] in [facility]   COLONOSCOPY  2019   CYSTOSCOPY WITH URETHRAL DILATATION N/A 06/15/2019   Procedure: CYSTOSCOPY WITH URETHRAL DILATATION;  Surgeon: Franchot Gallo, MD;  Location: [facility];  Service: Urology;  Laterality: N/A;  [place] N/A 05/09/2020   Procedure: CYSTOSCOPY WITH BALLOON DILATATION OF POSTERIOR URETHRAL STRICTURE, FLUOROSCOPIC INTERPRETATION;  Surgeon: Franchot Gallo, MD;  Location: [facility]  Service: Urology;  Laterality: N/A;   KNEE SURGERY  yrs ago   exploratory/left   SKIN CANCER EXCISION  01/2014   face   TRANSURETHRAL RESECTION OF PROSTATE     2008    There were no vitals filed for this visit.    Subjective Assessment - 12/03/20 0718     Subjective Feel that I still have a harder time braking later in the day.  Had several episodes of realizing to lift up my feet to slow down, but it's hard to lift up my feet later in the day.    Pertinent History PD 2008, bilateral DBS (2018); hyperlipidemia    Patient Stated Goals Want to stop the falls.    Currently in Pain? No/denies                               [facility] Adult PT Treatment/Exercise - 12/03/20 0001       Ambulation/Gait   Ambulation/Gait Yes    Ambulation/Gait Assistance 5: Supervision    Ambulation/Gait Assistance Details Cues for posture, increased effort level (8-9/10) to keep lifting feet for optimal foot clearance.    Ambulation Distance (Feet) 630 Feet    Assistive device None    Gait Pattern Step-through pattern;Decreased arm swing - right;Decreased arm swing - left;Decreased trunk rotation;Trunk flexed    Ambulation Surface Level;Indoor      High Level Balance   High Level Balance Comments Forward step and weightshift with cues for tall posture through stance leg, x 10 reps each, 2 sets; utilized hurdles to step over, with UE support at counter.  Cues to slow pace for increased foot clearance and step length.  He performs sit to stand with  PWR! Up at edge of mat, x 10 reps; standing PWR! Up x 10 reps.      Exercises   Exercises Other Exercises    Other Exercises  Quadruped hip extension/glut strengthening:  Leg kicks back, straight legs, 5 reps each side, then leg lifts with bent knee, 5 reps with assist, R more difficult.  Side hip kicks for abduction with knee bent, 5 reps      Knee/Hip Exercises: Prone   Hip Extension Limitations PT held knee in 90  degrees flexion 1 set, 5 reps; straight leg, hip extension 5 reps each side.            Additional NMR:  Seated PWR! Up for posture, 3 second hold at edge of mat, 10 reps Standing PWR! Up on Airex, x 10 reps, cues for glut activation in  upright standing       Balance Exercises - 12/03/20 0001       Balance Exercises: Standing   Stepping Strategy Anterior;10 reps    Stepping Strategy Limitations Stepping over line in tile, cues for step length, height, foot clearance    Other Standing Exercises STanding on Airex, Forward step and weightshift x 10 reps               PT Education - 12/03/20 0804     Education Details Additions to HEP    Person(s) Educated Patient    Methods Explanation;Demonstration;Other (comment)   emailed MedBridge link, as printer not working   Comprehension Verbalized understanding;Returned demonstration              PT Short Term Goals - 11/28/20 1432       PT SHORT TERM GOAL #1   Title Pt will be independent with HEP for improved strength, balance, transfers, and gait.  TARGET 11/29/2020    Baseline currently doing PWR! Moves stand, sit<>stand every other day    Time 4    Period Weeks    Status Partially Met      PT SHORT TERM GOAL #2   Title Patient will verbalize fall prevention strategies in the home, including tips to reduce hastening of gait, in order to decrease risk of future falls.    Baseline verbalizes 11/28/20    Time 4    Period Weeks    Status Achieved      PT SHORT TERM GOAL #3   Title Pt will perform at least 8 of 10 reps, minimal to no UE support, with NO retropulsion or posterior lean for imrpoved trasnfer safety and efficiency.    Baseline performed x10 reps from chair and an additional x10 reps from mat table with no UE support and no retropulsion.    Time 4    Period Weeks    Status Achieved      PT SHORT TERM GOAL #4   Title Pt will improve MiniBESTest score to at least 24/28 for decreased fall risk.    Baseline 21/28; 23/28 11/28/2020    Time 4    Period Weeks    Status Not Met               PT Long Term Goals - 11/01/20 1246       PT LONG TERM GOAL #1   Title Pt will be independent with progression of HEP for improved strength,  balance, transfers, and gait.  TARGET 12/13/2020  Time 6    Period Weeks    Status New      PT LONG TERM GOAL #2   Title Pt will verbalize plans for ongoing community fitness upon d/c from PT to maximize gains made in therapy.    Time 6    Period Weeks    Status New      PT LONG TERM GOAL #3   Title Pt will report at least 50% improvement in (decrease in) hastening episodes at home, for improved functional mobility.    Time 6    Period Weeks    Status New                   Plan - 12/03/20 8182     Clinical Impression Statement Continued skilled PT session focusing on foot clearance and posture, to assist with improved posture and pace with gait.  Also focused on hip/glut strengthening, with pt demonstrating difficulty with hip extension/knee flexed in quadruped nad prone position.  With gait, pt initially has improved posture and foot clearance, but with conversation tasks, he goes to decreased foot clearance, no hastening noted (but this is typically at end of day).  Cued patient to focus on increased intensity of movement patterns, thinking 8-9/10 on effort scale for foot clearance and posture; pt needs cues.    Personal Factors and Comorbidities Comorbidity 2    Comorbidities Parkinson's disease, HLD    Examination-Activity Limitations Locomotion Level;Transfers;Stand    Examination-Participation Restrictions Community Activity;Occupation    Stability/Clinical Decision Making Stable/Uncomplicated    Rehab Potential Good    PT Frequency 2x / week    PT Duration 6 weeks   plus eval   PT Treatment/Interventions Therapeutic activities;Therapeutic exercise;Patient/family education;Balance training;Neuromuscular re-education;DME Instruction;Gait training;Functional mobility training    PT Next Visit Plan Remind pt to PWR up before walking, PWR! Moves (especially PWR! Up and PWR! Step for posture, balance-on Airex for balance challenge); review forward step and weightshift added  to HEP; resisted standing and gait.  Review hip strengthening; how did it go with thinking of increased intensity for foot clearance?    Consulted and Agree with Plan of Care Patient             Patient will benefit from skilled therapeutic intervention in order to improve the following deficits and impairments:  Abnormal gait, Decreased balance, Decreased coordination, Decreased range of motion, Decreased mobility, Decreased strength, Difficulty walking, Postural dysfunction, Decreased activity tolerance, Pain  Visit Diagnosis: Abnormal posture  Other abnormalities of gait and mobility  Unsteadiness on feet  Muscle weakness (generalized)  Other symptoms and signs involving the nervous system     Problem List Patient Active Problem List   Diagnosis Date Noted   Hyperlipidemia 10/11/2020   Bilateral olecranon bursitis 07/05/2019   Other bursitis of knee, left knee 12/06/2018   Pain in right leg 12/06/2018   Parkinson disease ([place])     Luther Newhouse W. 12/03/2020, 8:09 AM Frazier Butt., PT  [facility] [address] [place], [name], [address] Phone: [phone]   Fax:  [phone]  Name: Gust Eugene MRN: [id number] Date of Birth: 1945-06-15

## 2020-12-06 ENCOUNTER — Ambulatory Visit: Payer: Medicare Other | Admitting: Physical Therapy

## 2020-12-10 ENCOUNTER — Ambulatory Visit (INDEPENDENT_AMBULATORY_CARE_PROVIDER_SITE_OTHER): Payer: Medicare Other | Admitting: Orthopaedic Surgery

## 2020-12-10 ENCOUNTER — Ambulatory Visit (INDEPENDENT_AMBULATORY_CARE_PROVIDER_SITE_OTHER): Payer: Medicare Other

## 2020-12-10 ENCOUNTER — Encounter: Payer: Self-pay | Admitting: Orthopaedic Surgery

## 2020-12-10 ENCOUNTER — Other Ambulatory Visit: Payer: Self-pay

## 2020-12-10 ENCOUNTER — Ambulatory Visit: Payer: Medicare Other | Admitting: Physical Therapy

## 2020-12-10 DIAGNOSIS — R0781 Pleurodynia: Secondary | ICD-10-CM

## 2020-12-10 NOTE — Progress Notes (Signed)
Office Visit Note   Patient: Ronald Noble           Date of Birth: [birth year]           MRN: [id number] Visit Date: 12/10/2020              Requested by: [name], MD [address] [place],  [place] [address] PCP: [name], MD   Assessment & Plan: Visit Diagnoses:  1. Rib pain on right side     Plan: Ronald Noble had a recent fall related to his Parkinson's and balance issues landing on the right side.  He scraped both of his knees the right side of his face and has had some rib pain.  His wife thinks he might be a little short of breath.  He also may have bruised his shoulder.  Films of the right ribs demonstrate a possible nondisplaced fracture of either the eighth or ninth rib.  The lung is fully expanded and limited views of the right shoulder was negative for any obvious fracture.  He had excellent range of motion of the shoulder without any cutaneous changes.  Doubt that there is any problem but would consider an MRI scan if he has any issues lingering over the next 3 to 4 weeks.  The rib fracture should heal without any problem but might be painful over the next 4 weeks or so.  He can use Advil or Aleve.  He does walk with a cane did not seem to have any problem with his knees other than just the abrasions  Follow-Up Instructions: Return if symptoms worsen or fail to improve.   Orders:  Orders Placed This Encounter  Procedures   XR Ribs Unilateral Right   No orders of the defined types were placed in this encounter.     Procedures: No procedures performed   Clinical Data: No additional findings.   Subjective: Chief Complaint  Patient presents with   Right Shoulder - Pain  Patient presents today for right shoulder and right sided rib pain. He states that he fell on 12/06/2020 while outside and landed on his right side and the pavement. He is having a difficult time using his right arm. He is right hand dominant. He also has been having right sided rib pain when  he bends over.  HPI  Review of Systems   Objective: Vital Signs: There were no vitals taken for this visit.  Physical Exam Constitutional:      Appearance: He is well-developed.  Eyes:     Pupils: Pupils are equal, round, and reactive to light.  Pulmonary:     Effort: Pulmonary effort is normal.  Skin:    General: Skin is warm and dry.  Neurological:     Mental Status: He is alert and oriented to person, place, and time.  Psychiatric:        Behavior: Behavior normal.    Ortho Exam awake and alert and oriented x3.  Comfortable sitting.  Did not appear to have any difficulty with respirations.  Did have some tenderness along the mid axillary line on the right around the eighth and ninth rib.  No neck pain.  He was able to place his arm over his head.  He is rigid related to his Parkinson's but no skin issues over the right shoulder and negative impingement testing  Specialty Comments:  No specialty comments available.  Imaging: XR Ribs Unilateral Right  Result Date: 12/10/2020 Right rib detail films were obtained in  multiple projections.  There might be a nondisplaced fracture of either the eighth or ninth rib.  Right lung is fully inflated.  A minute view of the right shoulder was negative for any obvious fracture    PMFS History: Patient Active Problem List   Diagnosis Date Noted   Rib pain on right side 12/10/2020   Hyperlipidemia 10/11/2020   Bilateral olecranon bursitis 07/05/2019   Other bursitis of knee, left knee 12/06/2018   Pain in right leg 12/06/2018   Parkinson disease ([place])    Past Medical History:  Diagnosis Date   BPH (benign prostatic hyperplasia)    COVID 04/24/2020   scratchy throat x 1 day all symptoms resolved   Parkinson disease ([place])    Skin cancer of face    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   Sleep apnea    mild no cpap used   Urethral stricture     Family History  Problem Relation Age of Onset   COPD Mother    Cancer  Father        Bladder   Colon cancer Neg Hx    Esophageal cancer Neg Hx    Stomach cancer Neg Hx     Past Surgical History:  Procedure Laterality Date   BURR HOLE W/ STEREOTACTIC INSERTION OF DBS LEADS / INTRAOP MICROELECTRODE RECORDING  04/2014   [facility] in [facility]   COLONOSCOPY  2019   [place] N/A 06/15/2019   Procedure: CYSTOSCOPY WITH URETHRAL DILATATION;  Surgeon: Franchot Gallo, MD;  Location: [facility];  Service: Urology;  Laterality: N/A;  [place] N/A 05/09/2020   Procedure: CYSTOSCOPY WITH BALLOON DILATATION OF POSTERIOR URETHRAL STRICTURE, FLUOROSCOPIC INTERPRETATION;  Surgeon: Franchot Gallo, MD;  Location: [facility];  Service: Urology;  Laterality: N/A;   KNEE SURGERY  yrs ago   exploratory/left   SKIN CANCER EXCISION  01/2014   face   TRANSURETHRAL RESECTION OF PROSTATE     2008   Social History   Occupational History   Occupation: LAWYER    Employer: [employer]  Tobacco Use   Smoking status: Never   Smokeless tobacco: Never  Vaping Use   Vaping Use: Never used  Substance and Sexual Activity   Alcohol use: Yes    Alcohol/week: 5.0 standard drinks    Types: 3 Glasses of wine, 2 Cans of beer per week    Comment: occasional   Drug use: No   Sexual activity: Not on file

## 2020-12-11 ENCOUNTER — Ambulatory Visit: Payer: Medicare Other | Admitting: Physical Therapy

## 2020-12-16 ENCOUNTER — Other Ambulatory Visit: Payer: Self-pay

## 2020-12-16 ENCOUNTER — Ambulatory Visit: Payer: Medicare Other | Attending: Orthopaedic Surgery | Admitting: Physical Therapy

## 2020-12-16 DIAGNOSIS — R2689 Other abnormalities of gait and mobility: Secondary | ICD-10-CM | POA: Diagnosis not present

## 2020-12-16 DIAGNOSIS — R293 Abnormal posture: Secondary | ICD-10-CM | POA: Diagnosis not present

## 2020-12-16 DIAGNOSIS — R2681 Unsteadiness on feet: Secondary | ICD-10-CM | POA: Diagnosis not present

## 2020-12-16 DIAGNOSIS — R29818 Other symptoms and signs involving the nervous system: Secondary | ICD-10-CM | POA: Insufficient documentation

## 2020-12-16 DIAGNOSIS — M6281 Muscle weakness (generalized): Secondary | ICD-10-CM | POA: Diagnosis not present

## 2020-12-16 NOTE — Therapy (Signed)
[facility] [address] [place], [name], [address] Phone: [phone]   Fax:  [phone]  Physical Therapy Treatment/Recert  Patient Details  Name: Ronald Noble MRN: [id number] Date of Birth: 06-Jun-1945 Referring Provider (PT): Eldridge Abrahams   Encounter Date: 12/16/2020   PT End of Session - 12/16/20 0801     Visit Number 8    Number of Visits 15    Date for PT Re-Evaluation 54/65/03   per recert 5/46/5681   Authorization Type Medicare & BCBS    PT Start Time 0719    PT Stop Time 0800    PT Time Calculation (min) 41 min    Activity Tolerance Patient tolerated treatment well    Behavior During Therapy [facility] for tasks assessed/performed             Past Medical History:  Diagnosis Date   BPH (benign prostatic hyperplasia)    COVID 04/24/2020   scratchy throat x 1 day all symptoms resolved   Parkinson disease ([place])    Skin cancer of face    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   Sleep apnea    mild no cpap used   Urethral stricture     Past Surgical History:  Procedure Laterality Date   BURR HOLE W/ STEREOTACTIC INSERTION OF DBS LEADS / INTRAOP MICROELECTRODE RECORDING  04/2014   [facility]   COLONOSCOPY  2019   CYSTOSCOPY WITH URETHRAL DILATATION N/A 06/15/2019   Procedure: CYSTOSCOPY WITH URETHRAL DILATATION;  Surgeon: Franchot Gallo, MD;  Location: [facility];  Service: Urology;  Laterality: N/A;  [place] N/A 05/09/2020   Procedure: CYSTOSCOPY WITH BALLOON DILATATION OF POSTERIOR URETHRAL STRICTURE, FLUOROSCOPIC INTERPRETATION;  Surgeon: Franchot Gallo, MD;  Location: [facility];  Service: Urology;  Laterality: N/A;   KNEE SURGERY  yrs ago   exploratory/left   SKIN CANCER EXCISION  01/2014   face   TRANSURETHRAL RESECTION OF PROSTATE     2008    There were no vitals filed  for this visit.   Subjective Assessment - 12/16/20 0722     Subjective Had a fall 10 days ago, moving quickly in the garage and fell on my knees/hit my shoulder.  Saw [facility] and they said non-displace fracture of the 8th rib.  Had to cancel the visits last week.  MD said no restrictions or precautions, just let pain be my guide.    Pertinent History PD 2008, bilateral DBS (2018); hyperlipidemia    Patient Stated Goals Want to stop the falls.    Currently in Pain? No/denies   in sitting still   Pain Score 3     Pain Location Rib cage    Pain Orientation Right    Pain Descriptors / Indicators Aching    Pain Type Acute pain    Pain Onset 1 to 4 weeks ago    Pain Frequency Intermittent    Aggravating Factors  certain movements    Pain Relieving Factors rest                               [facility] Adult PT Treatment/Exercise - 12/16/20 0001       Ambulation/Gait   Ambulation/Gait Yes    Ambulation/Gait Assistance 5: Supervision    Ambulation/Gait Assistance Details Initial cues for cane sequenceing and technique.  Ambulation Distance (Feet) 345 Feet   330; 230 ft x 2 (using U-step rollator, then cane again)   Assistive device Straight cane;Other (Comment)   U-step Rollator   Gait Pattern Step-through pattern;Decreased arm swing - right;Decreased arm swing - left;Decreased trunk rotation;Trunk flexed    Ambulation Surface Level;Indoor    Gait Comments Discussed pt's recent fall and recommendations for assistive device for safety.  Discussed SLOWED pace with gait-slowing down so much that PT would have to ask pt to speed up, being deliberate with gait patterns, and avoiding carrying things in his hands with gait.  Gait training with cane during most of session, with Gait with environmental scanning tasks and cognitive (naming college mascot tasks).  Pt tends to speed up with dual task activities.  Introduced [employer] as a way to help with safe quick stops with gait, with  pt "trying it out" at increased speeds and feeling he would trip forward over it due to increased momentum.  PT explained that purpose of therapy is to assist with strategies for slowed, safer gait and that his hastening of gait is likely to not go fully away, instead we are teaching him to stay ahead of it, increase his safety awareness and strategies to use to stay safe with gait.  Ultimately decided cane is likely better option now, and PT requests wife come in for additional training.      Self-Care   Self-Care Other Self-Care Comments;Posture    Other Self-Care Comments  Discussed progress towards goals, PT POC, especially given pt's recent fall.  Pt requests to take 1-2 weeks off to allow for rib healing then to return to PT and therapist agrees to continue to work on slowed, deliberate pace iwth gait.  Also, PT requests pt invite wife in for therapy session for instruction in cueing for improved carryover for safe gait.                     PT Education - 12/16/20 1527     Education Details See self-care/gait training; discussed POC and decided to wait for return to therapy 1-2 weeks, to allow for rib fracture healing    Person(s) Educated Patient    Methods Explanation    Comprehension Verbalized understanding              PT Short Term Goals - 11/28/20 1432       PT SHORT TERM GOAL #1   Title Pt will be independent with HEP for improved strength, balance, transfers, and gait.  TARGET 11/29/2020    Baseline currently doing PWR! Moves stand, sit<>stand every other day    Time 4    Period Weeks    Status Partially Met      PT SHORT TERM GOAL #2   Title Patient will verbalize fall prevention strategies in the home, including tips to reduce hastening of gait, in order to decrease risk of future falls.    Baseline verbalizes 11/28/20    Time 4    Period Weeks    Status Achieved      PT SHORT TERM GOAL #3   Title Pt will perform at least 8 of 10 reps, minimal to no UE  support, with NO retropulsion or posterior lean for imrpoved trasnfer safety and efficiency.    Baseline performed x10 reps from chair and an additional x10 reps from mat table with no UE support and no retropulsion.    Time 4    Period Weeks  Status Achieved      PT SHORT TERM GOAL #4   Title Pt will improve MiniBESTest score to at least 24/28 for decreased fall risk.    Baseline 21/28; 23/28 11/28/2020    Time 4    Period Weeks    Status Not Met               PT Long Term Goals - 12/16/20 9242       PT LONG TERM GOAL #1   Title Pt will be independent with progression of HEP for improved strength, balance, transfers, and gait.  TARGET 12/13/2020    Time 6    Period Weeks    Status On-going      PT LONG TERM GOAL #2   Title Pt will verbalize plans for ongoing community fitness upon d/c from PT to maximize gains made in therapy.    Time 6    Period Weeks    Status On-going      PT LONG TERM GOAL #3   Title Pt will report at least 50% improvement in (decrease in) hastening episodes at home, for improved functional mobility.    Baseline Had 1 fall about 10 days ago; overall, pt reports approx 50% decrease in hastening of gait episodes    Time 6    Period Weeks    Status Achieved            Updated goals for recert:   PT Short Term Goals - 12/16/20 1547       PT SHORT TERM GOAL #1   Title Pt will be independent with progression of  HEP for improved strength, balance, transfers, and gait.  TARGET10/10/2020    Time 4    Period Weeks    Status On-going      PT SHORT TERM GOAL #2   Title Pt will demonstrate appropriate use of cane for indoor and household activities, to assist with gait safety, at least 200 ft, supervision.    Time 4    Period Weeks    Status New      PT SHORT TERM GOAL #3   Title Pt will improve MiniBESTest score to at least 24/28 for decreased fall risk.    Time 4    Period Weeks    Status New             PT Long Term Goals -  12/16/20 1551       PT LONG TERM GOAL #1   Title Pt will be independent with final progression of HEP for improved strength, balance, transfers, and gait.  TARGET 02/07/2021    Time 8    Period Weeks    Status On-going      PT LONG TERM GOAL #2   Title Pt will verbalize plans for ongoing community fitness upon d/c from PT to maximize gains made in therapy.    Time 8    Period Weeks    Status On-going      PT LONG TERM GOAL #3   Title Pt will demonstrate at least ways to decrease hastening episodes in gait pattern, for improved functional mobility.    Baseline Can verbalize, but needs cues to demonstrate    Time 8    Period Weeks    Status Revised                   Plan - 12/16/20 1528     Clinical Impression Statement Pt has missed several visits due  to cancelling PT because of a fall resulting in non-displaced rib fracture on R side.  He returns today, for recert visit.  Pt has met 1 of 3 LTGs.  He has not met LTG 1 and 2; he reports meeting LTG 3 for overall lessened hastening with gait, though he did have above reported fall.  He will continue to benefit from skilled PT for additional short course (PT has requested wife come in for education/carryover for improved safety with gait).  Pt remains at fall risk with continued decreased timing and coordination of gait, and he would benefit from further skilled PT towards improved functional mobility and decreased fall risk.    Personal Factors and Comorbidities Comorbidity 2    Comorbidities Parkinson's disease, HLD    Examination-Activity Limitations Locomotion Level;Transfers;Stand    Examination-Participation Restrictions Community Activity;Occupation    Stability/Clinical Decision Making Stable/Uncomplicated    Rehab Potential Good    PT Frequency 1x / week    PT Duration 8 weeks   per recert 9/79/8921   PT Treatment/Interventions Therapeutic activities;Therapeutic exercise;Patient/family education;Balance  training;Neuromuscular re-education;DME Instruction;Gait training;Functional mobility training    PT Next Visit Plan Pt to be out of therapy for several weeks (allowing rib fracture to heal) and to return 01/06/2021.  Pt to bring wife into PT session at some point for education on gait training with gait; cues to SLOW pace with gait, PWR! Up posture in standing and with gait.  Review HEP, including forward step and weigthshifting, continued posture and balance work to slow/decrease hastening with gait.    Consulted and Agree with Plan of Care Patient             Patient will benefit from skilled therapeutic intervention in order to improve the following deficits and impairments:  Abnormal gait, Decreased balance, Decreased coordination, Decreased range of motion, Decreased mobility, Decreased strength, Difficulty walking, Postural dysfunction, Decreased activity tolerance, Pain  Visit Diagnosis: Other abnormalities of gait and mobility  Unsteadiness on feet  Abnormal posture  Other symptoms and signs involving the nervous system  Muscle weakness (generalized)     Problem List Patient Active Problem List   Diagnosis Date Noted   Rib pain on right side 12/10/2020   Hyperlipidemia 10/11/2020   Bilateral olecranon bursitis 07/05/2019   Other bursitis of knee, left knee 12/06/2018   Pain in right leg 12/06/2018   Parkinson disease ([place])     Anwar Crill W., PT 12/16/2020, 3:46 PM Frazier Butt., PT  [facility] [address] [place], [name], [address] Phone: [phone]   Fax:  [phone]  Name: Ronald Noble MRN: [id number] Date of Birth: 06/06/1945

## 2020-12-17 ENCOUNTER — Telehealth: Payer: Self-pay | Admitting: Neurology

## 2020-12-17 NOTE — Telephone Encounter (Signed)
I called pt and he relayed that he went back up to taking ropinirole XL '6mg'$  as he had increase of restless leg symptoms and night and could not rest.  He stated the decrease did not have any effect on the dyskinesias.  He still has these through out the day.  I reminded him of [name] request to f/u with him.  I relayed that you were out today and will address tomorrow or when you returns.  He verbalized understanding.

## 2020-12-17 NOTE — Telephone Encounter (Signed)
Pt called states the rOPINIRole (REQUIP XL) 4 MG 24 hr tablet is not helping. Pt is requesting a call back.

## 2020-12-18 MED ORDER — ROPINIROLE HCL ER 2 MG PO TB24: 2.0000 mg | ORAL_TABLET | Freq: Every day | ORAL | 0 refills | Status: DC

## 2020-12-18 NOTE — Telephone Encounter (Signed)
I placed a 90-day order for the ropinirole long-acting 2 mg strength once daily.  No refills, we can then change to the 6 mg once daily after he has finished his supply of 4 mg +2 mg.

## 2020-12-18 NOTE — Telephone Encounter (Signed)
It is okay for him to go back up on the ropinirole to 6 mg once daily long-acting.  Please provide a refill if he needs it.

## 2020-12-18 NOTE — Addendum Note (Signed)
Addended by: Star Age on: 12/18/2020 03:37 PM   Modules accepted: Orders

## 2020-12-18 NOTE — Telephone Encounter (Signed)
I spoke with the patient and discussed that Dr Rexene Alberts sent in a 90-day prescription for ropinirole long-acting 2 mg strength once daily.  Advised he can take 1 of these with his 4 mg tablet for total dose of 6 mg once daily and then once he finishes his supply, we can switch the prescription over to a 6 mg strength tablet.  Patient verbalized understanding was very appreciative.  He will follow-up in the office on December 12.

## 2020-12-18 NOTE — Telephone Encounter (Signed)
Called patient relayed that per Dr. Rexene Alberts it was okay to go back to taking ropinirole extended release 6 mg tablets once a day for his restless leg.  Patient questions whether he could take 90-day supply of extended release 2 mg to take with his extended release 4 mg tablet just that it cost him $150 picking it up is a 90-day supply.  If that is not possible then would send a 6 mg to [facility] on [address].

## 2021-01-06 ENCOUNTER — Ambulatory Visit: Payer: Medicare Other | Attending: Orthopaedic Surgery | Admitting: Physical Therapy

## 2021-01-06 ENCOUNTER — Encounter: Payer: Self-pay | Admitting: Physical Therapy

## 2021-01-06 ENCOUNTER — Other Ambulatory Visit: Payer: Self-pay

## 2021-01-06 DIAGNOSIS — M6281 Muscle weakness (generalized): Secondary | ICD-10-CM | POA: Diagnosis not present

## 2021-01-06 DIAGNOSIS — R29818 Other symptoms and signs involving the nervous system: Secondary | ICD-10-CM | POA: Diagnosis not present

## 2021-01-06 DIAGNOSIS — R2689 Other abnormalities of gait and mobility: Secondary | ICD-10-CM | POA: Diagnosis not present

## 2021-01-06 DIAGNOSIS — R2681 Unsteadiness on feet: Secondary | ICD-10-CM | POA: Diagnosis not present

## 2021-01-06 DIAGNOSIS — R293 Abnormal posture: Secondary | ICD-10-CM | POA: Insufficient documentation

## 2021-01-06 NOTE — Therapy (Addendum)
[facility] [address] [place], [name], [address] Phone: [phone]   Fax:  [phone]  Physical Therapy Treatment  Patient Details  Name: Ronald Noble MRN: [id number] Date of Birth: [birth year] Referring Provider (PT): Eldridge Abrahams   Encounter Date: 01/06/2021   PT End of Session - 01/06/21 0801     Visit Number 9    Number of Visits 15    Date for PT Re-Evaluation 69/62/95   per recert 2/84/1324   Authorization Type Medicare & BCBS    PT Start Time 0717    PT Stop Time 0758    PT Time Calculation (min) 41 min    Equipment Utilized During Treatment Gait belt    Activity Tolerance Patient tolerated treatment well    Behavior During Therapy WFL for tasks assessed/performed             Past Medical History:  Diagnosis Date   BPH (benign prostatic hyperplasia)    COVID 04/24/2020   scratchy throat x 1 day all symptoms resolved   Parkinson disease ([place])    Skin cancer of face    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   Sleep apnea    mild no cpap used   Urethral stricture     Past Surgical History:  Procedure Laterality Date   BURR HOLE W/ STEREOTACTIC INSERTION OF DBS LEADS / INTRAOP MICROELECTRODE RECORDING  04/2014   [facility]   COLONOSCOPY  2019   CYSTOSCOPY WITH URETHRAL DILATATION N/A 06/15/2019   Procedure: CYSTOSCOPY WITH URETHRAL DILATATION;  Surgeon: Franchot Gallo, MD;  Location: [facility];  Service: Urology;  Laterality: N/A;  [place] N/A 05/09/2020   Procedure: CYSTOSCOPY WITH BALLOON DILATATION OF POSTERIOR URETHRAL STRICTURE, FLUOROSCOPIC INTERPRETATION;  Surgeon: Franchot Gallo, MD;  Location: [facility];  Service: Urology;  Laterality: N/A;   KNEE SURGERY  yrs ago   exploratory/left   SKIN CANCER EXCISION  01/2014   face   TRANSURETHRAL RESECTION OF PROSTATE      2008    There were no vitals filed for this visit.   Subjective Assessment - 01/06/21 0719     Subjective Rib is feeling much better. No new falls. Still have the tendency in the afternoon when he gets out of control.    Pertinent History PD 2008, bilateral DBS (2018); hyperlipidemia    Patient Stated Goals Want to stop the falls.    Currently in Pain? No/denies    Pain Onset 1 to 4 weeks ago                               [facility] Adult PT Treatment/Exercise - 01/06/21 0726       Ambulation/Gait   Ambulation/Gait Yes    Ambulation/Gait Assistance 5: Supervision    Ambulation/Gait Assistance Details At start of session pt ambulating into clinic with no AD and looking at his phone and speeding up gait. Discussed making sure pt puts phone away in sitting and focuses on the task of walking. Working with [facility] with cues for proper sequencing and cane placement (as pt with tendency at times to have cane placed too far posteriorly). Continued to work on [employer] down pace of gait and to [employer] Up prior to gait. Later on in the day especially for pt to focus on slowed pace, stopping when  pt feels like he is speeding up, resetting with PWR Up and then continuing with slowed pace of gait. Continued to reiterate that PT won't be able to stop pt's hastening of gait, but instead will help work on strategies and awareness to help improve safety with gait    Ambulation Distance (Feet) 230 Feet   x2   Assistive device Straight cane    Gait Pattern Step-through pattern;Decreased arm swing - right;Decreased arm swing - left;Decreased trunk rotation;Trunk flexed    Ambulation Surface Level;Indoor    Gait Comments Worked on obstacle negotiation with cane around 4 cones, with cues for cane placement and proper sequencing, down and back x4 reps      Neuro Re-ed    Neuro Re-ed Details  Seated PWR Up x10 reps, standing PWR Up x10 reps, cues for holding posture for a sustained stretch. Importance  of performing both daily.                 Access Code: [email or web address] URL: [email or web address] Date: 01/06/2021 Prepared by: Janann August  Reviewed HEP and purpose of each exercise:   Exercises Alternating Step Forward with Support - 1 x daily - 5 x weekly - 2 sets - 10 reps - with single UE support next to countertop, cues for weight shift and incr foot clearance. Performed an additional x10 reps bilat with use of 4" obstacle for incr foot clearance.  Prone Hip Extension - 1 x daily - 5 x weekly - 1-2 sets - 10 reps Prone Hip Extension with Bent Knee - 1 x daily - 5 x weekly - 1-2 sets - 10 reps     PT Education - 01/06/21 0800     Education Details Re-scheduling remainder of pt's PT episodes in the afternoon (when pt tends to move faster and is more fatigued), pt would like to come in at this time. Pt to call back and schedule so wife can come to appts. Reviewed HEP.    Person(s) Educated Patient    Methods Explanation;Demonstration    Comprehension Verbalized understanding;Returned demonstration              PT Short Term Goals - 12/16/20 1547       PT SHORT TERM GOAL #1   Title Pt will be independent with progression of  HEP for improved strength, balance, transfers, and gait.  TARGET10/10/2020    Time 4    Period Weeks    Status On-going      PT SHORT TERM GOAL #2   Title Pt will demonstrate appropriate use of cane for indoor and household activities, to assist with gait safety, at least 200 ft, supervision.    Time 4    Period Weeks    Status New      PT SHORT TERM GOAL #3   Title Pt will improve MiniBESTest score to at least 24/28 for decreased fall risk.    Time 4    Period Weeks    Status New               PT Long Term Goals - 12/16/20 1551       PT LONG TERM GOAL #1   Title Pt will be independent with final progression of HEP for improved strength, balance, transfers, and gait.  TARGET 02/07/2021    Time 8    Period Weeks     Status On-going      PT LONG TERM GOAL #2   Title Pt will verbalize plans  for ongoing community fitness upon d/c from PT to maximize gains made in therapy.    Time 8    Period Weeks    Status On-going      PT LONG TERM GOAL #3   Title Pt will demonstrate at least ways to decrease hastening episodes in gait pattern, for improved functional mobility.    Baseline Can verbalize, but needs cues to demonstrate    Time 8    Period Weeks    Status Revised                   Plan - 01/06/21 1100     Clinical Impression Statement Pt returns today after taking a break from therapy to let his rib fx heal, which pt reports has been feeling much better. Continued to work on slowed pace with gait with use of SPC with initial cues for proper sequencing and cane placement. Pt wishes to reschedule remainder of his appts to the afternoon to work with therapy when pt tends to have his hastening of gait and is more fatigued. Pt to also bring his wife in to future appts for incr carryover for home. Began to review pt's HEP with reminders for technique as pt had not been performing consistently. Discussed importance of performing his seated/standing PWR Up daily for improved postural awareness. Will continue to progress towards LTGs.    Personal Factors and Comorbidities Comorbidity 2    Comorbidities Parkinson's disease, HLD    Examination-Activity Limitations Locomotion Level;Transfers;Stand    Examination-Participation Restrictions Community Activity;Occupation    Stability/Clinical Decision Making Stable/Uncomplicated    Rehab Potential Good    PT Frequency 1x / week    PT Duration 8 weeks   per recert 07/07/4740   PT Treatment/Interventions Therapeutic activities;Therapeutic exercise;Patient/family education;Balance training;Neuromuscular re-education;DME Instruction;Gait training;Functional mobility training    PT Next Visit Plan 10th visit PN. Check STGs. Floor transfers, review quadraped PWR  moves.  Pt to bring wife into PT session at some point for education on gait training with gait; cues to SLOW pace with gait, PWR! Up posture in standing and with gait.  continued posture and balance work to slow/decrease hastening with gait.    Consulted and Agree with Plan of Care Patient             Patient will benefit from skilled therapeutic intervention in order to improve the following deficits and impairments:  Abnormal gait, Decreased balance, Decreased coordination, Decreased range of motion, Decreased mobility, Decreased strength, Difficulty walking, Postural dysfunction, Decreased activity tolerance, Pain  Visit Diagnosis: Other abnormalities of gait and mobility  Unsteadiness on feet  Abnormal posture     Problem List Patient Active Problem List   Diagnosis Date Noted   Rib pain on right side 12/10/2020   Hyperlipidemia 10/11/2020   Bilateral olecranon bursitis 07/05/2019   Other bursitis of knee, left knee 12/06/2018   Pain in right leg 12/06/2018   Parkinson disease ([place])     Arliss Journey, PT, DPT  01/06/2021, 11:04 AM  [facility] [address] [place], [name], [address] Phone: [phone]   Fax:  [phone]  Name: Ronald Noble MRN: [id number] Date of Birth: Jul 13, 1945

## 2021-01-11 DIAGNOSIS — Z23 Encounter for immunization: Secondary | ICD-10-CM | POA: Diagnosis not present

## 2021-01-13 ENCOUNTER — Ambulatory Visit: Payer: Medicare Other | Admitting: Physical Therapy

## 2021-01-13 ENCOUNTER — Other Ambulatory Visit: Payer: Self-pay

## 2021-01-13 ENCOUNTER — Encounter: Payer: Self-pay | Admitting: Physical Therapy

## 2021-01-13 DIAGNOSIS — R29818 Other symptoms and signs involving the nervous system: Secondary | ICD-10-CM | POA: Diagnosis not present

## 2021-01-13 DIAGNOSIS — R2681 Unsteadiness on feet: Secondary | ICD-10-CM

## 2021-01-13 DIAGNOSIS — R2689 Other abnormalities of gait and mobility: Secondary | ICD-10-CM | POA: Diagnosis not present

## 2021-01-13 DIAGNOSIS — R293 Abnormal posture: Secondary | ICD-10-CM | POA: Diagnosis not present

## 2021-01-13 DIAGNOSIS — M6281 Muscle weakness (generalized): Secondary | ICD-10-CM | POA: Diagnosis not present

## 2021-01-13 NOTE — Therapy (Addendum)
[facility] [address] [place], [name], [address] Phone: [phone]   Fax:  [phone]  Physical Therapy Treatment/10th Visit Progress Note  Patient Details  Name: Ronald Noble MRN: [id number] Date of Birth: [birth year] Referring Provider (PT): Eldridge Abrahams  10th Visit Physical Therapy Progress Note  Dates of Reporting Period: 11/01/20 to 01/13/21     Encounter Date: 01/13/2021   PT End of Session - 01/13/21 0852     Visit Number 10    Number of Visits 15    Date for PT Re-Evaluation 16/07/37   per recert 04/11/2692   Authorization Type Medicare & BCBS    PT Start Time 0719    PT Stop Time 0758    PT Time Calculation (min) 39 min    Equipment Utilized During Treatment Gait belt    Activity Tolerance Patient tolerated treatment well    Behavior During Therapy WFL for tasks assessed/performed             Past Medical History:  Diagnosis Date   BPH (benign prostatic hyperplasia)    COVID 04/24/2020   scratchy throat x 1 day all symptoms resolved   Parkinson disease ([place])    Skin cancer of face    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   Sleep apnea    mild no cpap used   Urethral stricture     Past Surgical History:  Procedure Laterality Date   BURR HOLE W/ STEREOTACTIC INSERTION OF DBS LEADS / INTRAOP MICROELECTRODE RECORDING  04/2014   [facility] in [facility]   COLONOSCOPY  2019   CYSTOSCOPY WITH URETHRAL DILATATION N/A 06/15/2019   Procedure: CYSTOSCOPY WITH URETHRAL DILATATION;  Surgeon: Franchot Gallo, MD;  Location: [facility];  Service: Urology;  Laterality: N/A;  [place] N/A 05/09/2020   Procedure: CYSTOSCOPY WITH BALLOON DILATATION OF POSTERIOR URETHRAL STRICTURE, FLUOROSCOPIC INTERPRETATION;  Surgeon: Franchot Gallo, MD;  Location: [facility];  Service: Urology;  Laterality: N/A;    KNEE SURGERY  yrs ago   exploratory/left   SKIN CANCER EXCISION  01/2014   face   TRANSURETHRAL RESECTION OF PROSTATE     2008    There were no vitals filed for this visit.   Subjective Assessment - 01/13/21 0722     Subjective No falls. Still moving fast in the afternoons.    Pertinent History PD 2008, bilateral DBS (2018); hyperlipidemia    Patient Stated Goals Want to stop the falls.    Currently in Pain? No/denies    Pain Onset 1 to 4 weeks ago                [facility] PT Assessment - 01/13/21 0744       Mini-BESTest   Sit To Stand Normal: Comes to stand without use of hands and stabilizes independently.    Rise to Toes Normal: Stable for 3 s with maximum height.    Stand on one leg (left) Severe: Unable    Stand on one leg (right) Normal: 20 s.   20 seconds   Stand on one leg - lowest score 0    Compensatory Stepping Correction - Forward Normal: Recovers independently with a single, large step (second realignement is allowed).    Compensatory Stepping Correction - Backward Normal: Recovers independently with a single, large step    Compensatory Stepping Correction - Left Lateral Normal: Recovers independently with 1 step (crossover  or lateral OK)    Compensatory Stepping Correction - Right Lateral Normal: Recovers independently with 1 step (crossover or lateral OK)    Stepping Corredtion Lateral - lowest score 2    Stance - Feet together, eyes open, firm surface  Normal: 30s    Stance - Feet together, eyes closed, foam surface  Normal: 30s    Incline - Eyes Closed Normal: Stands independently 30s and aligns with gravity    Change in Gait Speed Normal: Significantly changes walkling speed without imbalance    Walk with head turns - Horizontal Normal: performs head turns with no change in gait speed and good balance    Walk with pivot turns Normal: Turns with feet close FAST (< 3 steps) with good balance.    Step over obstacles Normal: Able to step over box with minimal  change of gait speed and with good balance.    Timed UP & GO with Dual Task Normal: No noticeable change in sitting, standing or walking while backward counting when compared to TUG without    Mini-BEST total score 26      Timed Up and Go Test   Normal TUG (seconds) 7.37    Cognitive TUG (seconds) 7.44                           [facility] Adult PT Treatment/Exercise - 01/13/21 0744       Transfers   Transfers Floor to Transfer    Floor to Transfer 5: Supervision    Floor to Transfer Details (indicate cue type and reason) From mat on floor; pt demonstrating how he gets down to the floor with using UE support and then kneeling down and pt demonstrating how he gets up from downward dog position and walking his hands up. Pt with more narrow BOS when doing this and needing one episode of min guard for balance. Discussed best way would be to have pt have a chair/sturdy object next to him (like when performing his quadraped PWR moves) and then getting into half kneel and pressing up to stand with UE support and being more mindful of wider BOS, performed x1 rep with half kneel position             Quadruped PWR! Moves (pt able to perform with mirror in front of him as visual cue)  PWR! Up, x 10 reps for posture PWR! Rock x 10 for flexibility and weightshifting PWR! Twist x 5 reps each side for trunk rotation PWR! Step, x 5 reps each side, with PWR! Up posture position   Cues for technique, hold time with focus on posture.           PT Education - 01/13/21 [phone]     Education Details Progress towards goals, having pt bring in his wife to next session for carryover at home.    Person(s) Educated Patient    Methods Explanation    Comprehension Verbalized understanding              PT Short Term Goals - 01/13/21 0853       PT SHORT TERM GOAL #1   Title Pt will be independent with progression of  HEP for improved strength, balance, transfers, and gait.   TARGET10/10/2020    Time 4    Period Weeks    Status Achieved      PT SHORT TERM GOAL #2   Title Pt will demonstrate appropriate use of cane for  indoor and household activities, to assist with gait safety, at least 200 ft, supervision.    Time 4    Period Weeks    Status New      PT SHORT TERM GOAL #3   Title Pt will improve MiniBESTest score to at least 24/28 for decreased fall risk.    Baseline 26/28 on 01/13/21    Time 4    Period Weeks    Status Achieved               PT Long Term Goals - 12/16/20 1551       PT LONG TERM GOAL #1   Title Pt will be independent with final progression of HEP for improved strength, balance, transfers, and gait.  TARGET 02/07/2021    Time 8    Period Weeks    Status On-going      PT LONG TERM GOAL #2   Title Pt will verbalize plans for ongoing community fitness upon d/c from PT to maximize gains made in therapy.    Time 8    Period Weeks    Status On-going      PT LONG TERM GOAL #3   Title Pt will demonstrate at least ways to decrease hastening episodes in gait pattern, for improved functional mobility.    Baseline Can verbalize, but needs cues to demonstrate    Time 8    Period Weeks    Status Revised                   Plan - 01/13/21 1219     Clinical Impression Statement 10th visit progress note: Checked pt's STGs today with pt meeting #1 and #3. Did not have time to assess STG #2 at end of session due to time constraints. Pt did improve miniBEST score to a 26/28 (previously was 23/28). Reviewed pt's quadraped PWR moves for HEP with focus on posture and use of mirror as visual cue. Pt with more hastening of gait episodes later on in the day- rescheduled pt's last 2 appts for later on in the day for training during these episodes. Will continue to progress towards LTGs.    Personal Factors and Comorbidities Comorbidity 2    Comorbidities Parkinson's disease, HLD    Examination-Activity Limitations Locomotion  Level;Transfers;Stand    Examination-Participation Restrictions Community Activity;Occupation    Stability/Clinical Decision Making Stable/Uncomplicated    Rehab Potential Good    PT Frequency 1x / week    PT Duration 8 weeks   per recert 7/82/9562   PT Treatment/Interventions Therapeutic activities;Therapeutic exercise;Patient/family education;Balance training;Neuromuscular re-education;DME Instruction;Gait training;Functional mobility training    PT Next Visit Plan Pt to bring wife into PT session at some point for education on gait training with gait; cues to SLOW pace with gait, PWR! Up posture in standing and with gait.  continued posture and balance work to slow/decrease hastening with gait. SLS tasks, stepping over obstacles.    Consulted and Agree with Plan of Care Patient             Patient will benefit from skilled therapeutic intervention in order to improve the following deficits and impairments:  Abnormal gait, Decreased balance, Decreased coordination, Decreased range of motion, Decreased mobility, Decreased strength, Difficulty walking, Postural dysfunction, Decreased activity tolerance, Pain  Visit Diagnosis: Other abnormalities of gait and mobility  Unsteadiness on feet  Other symptoms and signs involving the nervous system  Abnormal posture     Problem List Patient Active Problem List  Diagnosis Date Noted   Rib pain on right side 12/10/2020   Hyperlipidemia 10/11/2020   Bilateral olecranon bursitis 07/05/2019   Other bursitis of knee, left knee 12/06/2018   Pain in right leg 12/06/2018   Parkinson disease ([place])     Arliss Journey, PT, DPT 01/13/2021, 12:22 PM  [facility] [address] [place], [name], [address] Phone: [phone]   Fax:  [phone]  Name: Armand Preast MRN: [id number] Date of Birth: 01-04-1946

## 2021-01-20 ENCOUNTER — Ambulatory Visit: Payer: Medicare Other | Admitting: Physical Therapy

## 2021-01-27 ENCOUNTER — Ambulatory Visit: Payer: Medicare Other | Admitting: Physical Therapy

## 2021-01-28 ENCOUNTER — Other Ambulatory Visit: Payer: Self-pay

## 2021-01-28 ENCOUNTER — Ambulatory Visit: Payer: Medicare Other | Admitting: Physical Therapy

## 2021-01-28 ENCOUNTER — Encounter: Payer: Self-pay | Admitting: Physical Therapy

## 2021-01-28 DIAGNOSIS — R2681 Unsteadiness on feet: Secondary | ICD-10-CM

## 2021-01-28 DIAGNOSIS — R2689 Other abnormalities of gait and mobility: Secondary | ICD-10-CM

## 2021-01-28 DIAGNOSIS — R293 Abnormal posture: Secondary | ICD-10-CM | POA: Diagnosis not present

## 2021-01-28 DIAGNOSIS — M6281 Muscle weakness (generalized): Secondary | ICD-10-CM | POA: Diagnosis not present

## 2021-01-28 DIAGNOSIS — R29818 Other symptoms and signs involving the nervous system: Secondary | ICD-10-CM

## 2021-01-28 NOTE — Therapy (Signed)
[facility] [address] [place], [name], [address] Phone: [phone]   Fax:  [phone]  Physical Therapy Treatment  Patient Details  Name: Ronald Noble MRN: [id number] Date of Birth: 12-25-1945 Referring Provider (PT): Eldridge Abrahams   Encounter Date: 01/28/2021   PT End of Session - 01/28/21 1537     Visit Number 11    Number of Visits 15    Date for PT Re-Evaluation 51/88/41   per recert 6/60/6301   Authorization Type Medicare & BCBS    PT Start Time 6010    PT Stop Time 9323    PT Time Calculation (min) 42 min    Equipment Utilized During Treatment Gait belt    Activity Tolerance Patient tolerated treatment well    Behavior During Therapy WFL for tasks assessed/performed             Past Medical History:  Diagnosis Date   BPH (benign prostatic hyperplasia)    COVID 04/24/2020   scratchy throat x 1 day all symptoms resolved   Parkinson disease ([place])    Skin cancer of face    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   Sleep apnea    mild no cpap used   Urethral stricture     Past Surgical History:  Procedure Laterality Date   BURR HOLE W/ STEREOTACTIC INSERTION OF DBS LEADS / INTRAOP MICROELECTRODE RECORDING  04/2014   [facility]   COLONOSCOPY  2019   CYSTOSCOPY WITH URETHRAL DILATATION N/A 06/15/2019   Procedure: CYSTOSCOPY WITH URETHRAL DILATATION;  Surgeon: Franchot Gallo, MD;  Location: [facility];  Service: Urology;  Laterality: N/A;  [place] N/A 05/09/2020   Procedure: CYSTOSCOPY WITH BALLOON DILATATION OF POSTERIOR URETHRAL STRICTURE, FLUOROSCOPIC INTERPRETATION;  Surgeon: Franchot Gallo, MD;  Location: [facility];  Service: Urology;  Laterality: N/A;   KNEE SURGERY  yrs ago   exploratory/left   SKIN CANCER EXCISION  01/2014   face   TRANSURETHRAL RESECTION OF  PROSTATE     2008    There were no vitals filed for this visit.   Subjective Assessment - 01/28/21 1538     Subjective Had a fall the other night - was getting up to go to the bathroom and thinks he was moving too fast. Main issue is still walking too fast. Brought in his cane from home.    Pertinent History PD 2008, bilateral DBS (2018); hyperlipidemia    Patient Stated Goals Want to stop the falls.    Currently in Pain? No/denies    Pain Onset 1 to 4 weeks ago                               [facility] Adult PT Treatment/Exercise - 01/28/21 1604       Ambulation/Gait   Ambulation/Gait Yes    Ambulation/Gait Assistance 5: Supervision;4: Min guard    Ambulation/Gait Assistance Details Pt brought in [facility] to practice with during today's session. Worked on gait training with cane with focus on SLOWED PACE, tall posture, picking up feet (thinking about stepping over an obstacle). When pt focused on foot clearance, pt with more controlled pace of gait. Needs cues to focus on incr effort when thinking about foot clearance. Discussed making sure that pt is focusing on the task of walking at home vs. dual tasking. Continued  to educate that purpose of therapy is to assist with strategies for slowed, safer gait and incr his safety awareness if he notices his posture is too flexed/steps get smaller then to stop and reset with gait to take longer steps.    Ambulation Distance (Feet) 115 Feet   x5   Assistive device Straight cane    Gait Pattern Step-through pattern;Decreased arm swing - right;Decreased arm swing - left;Decreased trunk rotation;Trunk flexed    Ambulation Surface Level;Indoor    Gait Comments Discussed potentially trying a RW for later on in the day when pt notices that his gait gets too fast. Pt stating that he would not want to use one.      Neuro Re-ed    Neuro Re-ed Details  Standing with UE support: Wide BOS lateral weight shifting 2 x 10 reps, cues for slowed pace  when performing and to perform initially in standing to help pt weight shift to take a big step. Alternating forward stepping to colorful floor discs x10 reps bilat, performed an additional x10 reps as review of pt's exercise from HEP with focus on BIG intensity when stepping for foot clearance. x10 reps bilat alternating stepping over 2" obstacle - focusing on slowed pace. At bottom of stair case- alternating toe taps to bottom step x10 reps then to 2nd step x10 reps                     PT Education - 01/28/21 1727     Education Details Asking pt's wife to come into next session for incr carryover for home. Having night lights put in pt's bathroom to help with light as pt had a recent fall in the bathroom. Pt was holding flashlight, but discussed that might not have been enough    Person(s) Educated Patient    Methods Explanation    Comprehension Verbalized understanding              PT Short Term Goals - 01/13/21 0853       PT SHORT TERM GOAL #1   Title Pt will be independent with progression of  HEP for improved strength, balance, transfers, and gait.  TARGET10/10/2020    Time 4    Period Weeks    Status Achieved      PT SHORT TERM GOAL #2   Title Pt will demonstrate appropriate use of cane for indoor and household activities, to assist with gait safety, at least 200 ft, supervision.    Time 4    Period Weeks    Status New      PT SHORT TERM GOAL #3   Title Pt will improve MiniBESTest score to at least 24/28 for decreased fall risk.    Baseline 26/28 on 01/13/21    Time 4    Period Weeks    Status Achieved               PT Long Term Goals - 12/16/20 1551       PT LONG TERM GOAL #1   Title Pt will be independent with final progression of HEP for improved strength, balance, transfers, and gait.  TARGET 02/07/2021    Time 8    Period Weeks    Status On-going      PT LONG TERM GOAL #2   Title Pt will verbalize plans for ongoing community fitness upon  d/c from PT to maximize gains made in therapy.    Time 8    Period Weeks  Status On-going      PT LONG TERM GOAL #3   Title Pt will demonstrate at least ways to decrease hastening episodes in gait pattern, for improved functional mobility.    Baseline Can verbalize, but needs cues to demonstrate    Time 8    Period Weeks    Status Revised                   Plan - 01/28/21 1731     Clinical Impression Statement Pt changed appt to a later time in the day to show how fast he moves as the day progresses. Pt also brought in his cane for additional practice. Practiced on tall posture, DELIBERATE foot clearance with incr effort when picking up feet. When pt really focused on foot clearance he was able to take longer strides and it helped slow down pace of gait. Pt will need continued practice, pt's spouse to come in to next session for education.    Personal Factors and Comorbidities Comorbidity 2    Comorbidities Parkinson's disease, HLD    Examination-Activity Limitations Locomotion Level;Transfers;Stand    Examination-Participation Restrictions Community Activity;Occupation    Stability/Clinical Decision Making Stable/Uncomplicated    Rehab Potential Good    PT Frequency 1x / week    PT Duration 8 weeks   per recert 2/70/3500   PT Treatment/Interventions Therapeutic activities;Therapeutic exercise;Patient/family education;Balance training;Neuromuscular re-education;DME Instruction;Gait training;Functional mobility training    PT Next Visit Plan check LTGs and re-cert?. Pt to bring wife into PT session at some point for education on gait training with gait; cues to SLOW pace with gait, PWR! Up posture in standing and with gait.  continued posture and balance work to slow/decrease hastening with gait. SLS tasks, stepping over obstacles.    Consulted and Agree with Plan of Care Patient             Patient will benefit from skilled therapeutic intervention in order to improve  the following deficits and impairments:  Abnormal gait, Decreased balance, Decreased coordination, Decreased range of motion, Decreased mobility, Decreased strength, Difficulty walking, Postural dysfunction, Decreased activity tolerance, Pain  Visit Diagnosis: Other abnormalities of gait and mobility  Other symptoms and signs involving the nervous system  Unsteadiness on feet  Muscle weakness (generalized)     Problem List Patient Active Problem List   Diagnosis Date Noted   Rib pain on right side 12/10/2020   Hyperlipidemia 10/11/2020   Bilateral olecranon bursitis 07/05/2019   Other bursitis of knee, left knee 12/06/2018   Pain in right leg 12/06/2018   Parkinson disease ([place])     Arliss Journey, PT, DPT  01/28/2021, 5:34 PM  [facility] [address] [place], [name], [address] Phone: [phone]   Fax:  [phone]  Name: Ronald Noble MRN: [id number] Date of Birth: May 05, 1945

## 2021-02-04 ENCOUNTER — Ambulatory Visit: Payer: Medicare Other | Attending: Orthopaedic Surgery | Admitting: Physical Therapy

## 2021-02-04 ENCOUNTER — Other Ambulatory Visit: Payer: Self-pay

## 2021-02-04 DIAGNOSIS — R29818 Other symptoms and signs involving the nervous system: Secondary | ICD-10-CM | POA: Diagnosis not present

## 2021-02-04 DIAGNOSIS — M6281 Muscle weakness (generalized): Secondary | ICD-10-CM | POA: Insufficient documentation

## 2021-02-04 DIAGNOSIS — R2689 Other abnormalities of gait and mobility: Secondary | ICD-10-CM | POA: Insufficient documentation

## 2021-02-04 DIAGNOSIS — R2681 Unsteadiness on feet: Secondary | ICD-10-CM | POA: Diagnosis not present

## 2021-02-04 NOTE — Therapy (Addendum)
[facility] [address] [place], [name], [address] Phone: [phone]   Fax:  [phone]  Physical Therapy Treatment/Re-Cert  Patient Details  Name: Ronald Noble MRN: [id number] Date of Birth: 04-29-1945 Referring Provider (PT): Eldridge Abrahams   Encounter Date: 02/04/2021    02/04/21 1626  PT Visits / Re-Eval  Visit Number 12  Number of Visits 18  Date for PT Re-Evaluation 70/01/74 (per recert on 94/4/96)  Authorization  Authorization Type Medicare & BCBS  PT Time Calculation  PT Start Time 7591 (PT running late)  PT Stop Time 1707  PT Time Calculation (min) 44 min  PT - End of Session  Equipment Utilized During Treatment Gait belt  Activity Tolerance Patient tolerated treatment well  Behavior During Therapy WFL for tasks assessed/performed     Past Medical History:  Diagnosis Date   BPH (benign prostatic hyperplasia)    COVID 04/24/2020   scratchy throat x 1 day all symptoms resolved   Parkinson disease ([place])    Skin cancer of face    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   Sleep apnea    mild no cpap used   Urethral stricture     Past Surgical History:  Procedure Laterality Date   BURR HOLE W/ STEREOTACTIC INSERTION OF DBS LEADS / INTRAOP MICROELECTRODE RECORDING  04/2014   [facility]   COLONOSCOPY  2019   CYSTOSCOPY WITH URETHRAL DILATATION N/A 06/15/2019   Procedure: CYSTOSCOPY WITH URETHRAL DILATATION;  Surgeon: Franchot Gallo, MD;  Location: [facility];  Service: Urology;  Laterality: N/A;  [place] N/A 05/09/2020   Procedure: CYSTOSCOPY WITH BALLOON DILATATION OF POSTERIOR URETHRAL STRICTURE, FLUOROSCOPIC INTERPRETATION;  Surgeon: Franchot Gallo, MD;  Location: [facility];  Service: Urology;  Laterality: N/A;   KNEE SURGERY  yrs ago   exploratory/left   SKIN CANCER  EXCISION  01/2014   face   TRANSURETHRAL RESECTION OF PROSTATE     2008    There were no vitals filed for this visit.   Subjective Assessment - 02/04/21 1626     Subjective Nothing new. No falls. Had some stumbles and was going too fast. Has been practicing with the cane. Has restarted doing his exercises and they're going well. Is going to have his battery replaced for his DBS next week.    Pertinent History PD 2008, bilateral DBS (2018); hyperlipidemia    Patient Stated Goals Want to stop the falls.    Currently in Pain? No/denies    Pain Onset 1 to 4 weeks ago                [facility] PT Assessment - 02/04/21 2011       Assessment   Medical Diagnosis Parkinson's disease    Referring Provider (PT) Eldridge Abrahams    Onset Date/Surgical Date 10/01/20    Hand Dominance Right      Prior Function   Level of Independence Independent                            02/04/21 2011  Transfers  Comments Cues with sit <> stands for tall posture  Ambulation/Gait  Ambulation/Gait Yes  Ambulation/Gait Assistance 5: Supervision;4: Min guard  Ambulation/Gait Assistance Details Pt brought in his [place] from home, determined it was one notch too high (unable to be adjusted and will have to  purchase a new one). Worked on gait training with cane with spouse present for educated for home with working on [employer] of gait. Pt's spouse reports that he is still going super fast at home and when he does will bump into walls/couches to stop him from going so fast. With gait with SPC, cued for posture, BIG effort with steps (picking up feet each time for a longer stride) - this helps slow patient down when he is focusing on taking big steps. Discussed that when his steps start to get smaller he needs to STOP reset with tall posture and continue with taking BIG STEPS working on incr effort with foot clearance (esp with RLE). Continued to reiterate that PT won't be able to stop pt's  hastening of gait, but instead will help work on strategies and awareness to help improve safety with gait. Trialed a RW to see if it would help slow patient down (just to use later on in the day when pt moves too quickly, as it might be a good option to help prevent falls), pt able to slow down gait speed, did need cues for posture. Pt did not like it as he reports it made him too forward flexed.  Ambulation Distance (Feet) 115 Feet (x4)  Assistive device Straight cane;Rolling walker  Gait Pattern Step-through pattern;Decreased arm swing - right;Decreased arm swing - left;Decreased trunk rotation;Trunk flexed  Ambulation Surface Level;Indoor  Therapeutic Activites   Therapeutic Activities Other Therapeutic Activities  Other Therapeutic Activities Discussed fall prevention regarding focusing on the task at hand especially with gait vs. dual tasking. Pt's wife is concerned about him going up/down the stairs when going into the office holding multiple things in his hand at once. Discussed making multiple trips, using the handrail, and having items kept in his car that he doesn't need to bring in (keeping a cane in his house and a cane in his car), putting items in his pockets/bag that he can put on his shoulder vs. holding objects in hand. Pt's wife asking about pt's HEP as pt is performing exercises for 1-1.5 hours each night (pt does not mind this).  Pt reports that he is performing exercises from previous bouts of therapy (including ortho) and neuro PT as well as other exercises that he has in a folder. Discussed having pt bring in his folder next time so that exercises can be condensed/can make an appropriate exercise chart to spread exercises out throughout the week and put incr effort into performing.      PT Education - 02/04/21 2008     Education Details See therapeutic activity/gait training section. Adding an additional 4 visits (after pt gets his DBS battery replaced) and that pt will need  resume orders from his doctor to return to therapy.    Person(s) Educated Patient;Spouse    Methods Explanation    Comprehension Verbalized understanding              PT Short Term Goals - 01/13/21 0853       PT SHORT TERM GOAL #1   Title Pt will be independent with progression of  HEP for improved strength, balance, transfers, and gait.  TARGET10/10/2020    Time 4    Period Weeks    Status Achieved      PT SHORT TERM GOAL #2   Title Pt will demonstrate appropriate use of cane for indoor and household activities, to assist with gait safety, at least 200 ft, supervision.  Time 4    Period Weeks    Status New      PT SHORT TERM GOAL #3   Title Pt will improve MiniBESTest score to at least 24/28 for decreased fall risk.    Baseline 26/28 on 01/13/21    Time 4    Period Weeks    Status Achieved               PT Long Term Goals - 02/07/21 1008       PT LONG TERM GOAL #1   Title Pt will be independent with final progression of HEP for improved strength, balance, transfers, and gait.  TARGET 02/07/2021    Baseline still need to review and update HEP, ran out of time on 02/04/21    Time 8    Period Weeks    Status On-going      PT LONG TERM GOAL #2   Title Pt will verbalize plans for ongoing community fitness upon d/c from PT to maximize gains made in therapy.    Time 8    Period Weeks    Status On-going      PT LONG TERM GOAL #3   Title Pt will demonstrate at least ways to decrease hastening episodes in gait pattern, for improved functional mobility.    Baseline Can verbalize, continues to need cues to perform.    Time 8    Period Weeks    Status Partially Met            Revised/ongoing LTGs for recert:   PT Long Term Goals - 02/07/21 1009       PT LONG TERM GOAL #1   Title Pt will be independent with final progression of HEP for improved strength, balance, transfers, and gait.  TARGET 03/07/21    Baseline still need to review and update HEP, ran out  of time on 02/04/21    Time 4    Period Weeks    Status On-going    Target Date 03/07/21      PT LONG TERM GOAL #2   Title Pt will verbalize plans for ongoing community fitness upon d/c from PT to maximize gains made in therapy.    Time 4    Period Weeks    Status On-going      PT LONG TERM GOAL #3   Title Pt will demonstrate at least ways to decrease hastening episodes in gait pattern, for improved functional mobility.    Baseline Can verbalize, continues to need cues to perform.    Time 4    Period Weeks    Status On-going      PT LONG TERM GOAL #4   Title Pt will demonstrate appropriate use of cane for indoor and household activities, to assist with gait safety, at least 200 ft, supervision.    Time 4    Status New               02/07/21 0748  Plan  Clinical Impression Statement Pt brought wife in to session today for education. Continued to work on gait with SPC with cues for posture, DELIBERATE foot clearance, BIG steps (with incr effort when picking up feet). When pt focuses on the task of foot clearance, pt does do a better job with incr stride length and slowing down pace of gait. Does need continued cues for this and wife present for education. Due to time constraints, did not have the time to review pt's exercise program. Currently pt's  wife reports that he is performing exercises for 1.5 hours each night. Discussed we will need to review and work on condensing exercises/getting pt set up with an exercise chart. Pt remains at fall risk with continued decreased timing and coordination of gait and hastening of gait. Pt will be getting his DBS battery replaced next week, and will resume therapy a couple weeks afterwards once pt has resume orders.  Will continue to benefit from skilled PT in order to improve functional mobility and decr fall risk. LTGs updated/revised as appropriate.  Personal Factors and Comorbidities Comorbidity 2  Comorbidities Parkinson's disease, HLD   Examination-Activity Limitations Locomotion Level;Transfers;Stand  Examination-Participation Restrictions Community Activity;Occupation  Pt will benefit from skilled therapeutic intervention in order to improve on the following deficits Abnormal gait;Decreased balance;Decreased coordination;Decreased range of motion;Decreased mobility;Decreased strength;Difficulty walking;Postural dysfunction;Decreased activity tolerance;Pain  Stability/Clinical Decision Making Stable/Uncomplicated  Rehab Potential Good  PT Frequency 1x / week  PT Duration 8 weeks (per recert on 61/6/07)  PT Treatment/Interventions Therapeutic activities;Therapeutic exercise;Patient/family education;Balance training;Neuromuscular re-education;DME Instruction;Gait training;Functional mobility training  PT Next Visit Plan pt to bring in his exercise folder from home - review and updated/condense. Pt to bring wife into PT session at some point for education on gait training with gait; cues to SLOW pace with gait, PWR! Up posture in standing and with gait.  continued posture and balance work to slow/decrease hastening with gait. SLS tasks, stepping over obstacles.  Consulted and Agree with Plan of Care Patient         Patient will benefit from skilled therapeutic intervention in order to improve the following deficits and impairments:     Visit Diagnosis: Other symptoms and signs involving the nervous system  Other abnormalities of gait and mobility  Unsteadiness on feet     Problem List Patient Active Problem List   Diagnosis Date Noted   Rib pain on right side 12/10/2020   Hyperlipidemia 10/11/2020   Bilateral olecranon bursitis 07/05/2019   Other bursitis of knee, left knee 12/06/2018   Pain in right leg 12/06/2018   Parkinson disease ([place])     Arliss Journey, PT, DPT 02/04/2021, 8:15 PM  [facility] [address] [place], [name],  [address] Phone: [phone]   Fax:  [phone]  Name: Janson Lamar MRN: [id number] Date of Birth: 04/29/1945

## 2021-02-05 DIAGNOSIS — G245 Blepharospasm: Secondary | ICD-10-CM | POA: Diagnosis not present

## 2021-02-05 DIAGNOSIS — K117 Disturbances of salivary secretion: Secondary | ICD-10-CM | POA: Diagnosis not present

## 2021-02-05 DIAGNOSIS — I951 Orthostatic hypotension: Secondary | ICD-10-CM | POA: Diagnosis not present

## 2021-02-07 NOTE — Addendum Note (Signed)
Addended by: Arliss Journey on: 02/07/2021 10:12 AM   Modules accepted: Orders

## 2021-02-12 DIAGNOSIS — G20 Parkinson's disease: Secondary | ICD-10-CM | POA: Diagnosis not present

## 2021-02-12 DIAGNOSIS — Z472 Encounter for removal of internal fixation device: Secondary | ICD-10-CM | POA: Diagnosis not present

## 2021-02-12 DIAGNOSIS — I951 Orthostatic hypotension: Secondary | ICD-10-CM | POA: Diagnosis not present

## 2021-02-12 DIAGNOSIS — G4733 Obstructive sleep apnea (adult) (pediatric): Secondary | ICD-10-CM | POA: Diagnosis not present

## 2021-02-12 DIAGNOSIS — Z4542 Encounter for adjustment and management of neuropacemaker (brain) (peripheral nerve) (spinal cord): Secondary | ICD-10-CM | POA: Diagnosis not present

## 2021-02-14 ENCOUNTER — Other Ambulatory Visit: Payer: Self-pay | Admitting: Neurology

## 2021-02-14 DIAGNOSIS — G479 Sleep disorder, unspecified: Secondary | ICD-10-CM

## 2021-02-14 DIAGNOSIS — G4752 REM sleep behavior disorder: Secondary | ICD-10-CM

## 2021-02-26 DIAGNOSIS — Z4802 Encounter for removal of sutures: Secondary | ICD-10-CM | POA: Diagnosis not present

## 2021-02-26 DIAGNOSIS — Z9682 Presence of neurostimulator: Secondary | ICD-10-CM | POA: Diagnosis not present

## 2021-02-26 DIAGNOSIS — Z9889 Other specified postprocedural states: Secondary | ICD-10-CM | POA: Diagnosis not present

## 2021-03-04 ENCOUNTER — Other Ambulatory Visit: Payer: Self-pay

## 2021-03-04 ENCOUNTER — Encounter: Payer: Self-pay | Admitting: Physical Therapy

## 2021-03-04 ENCOUNTER — Ambulatory Visit: Payer: Medicare Other | Admitting: Physical Therapy

## 2021-03-04 DIAGNOSIS — M6281 Muscle weakness (generalized): Secondary | ICD-10-CM | POA: Diagnosis not present

## 2021-03-04 DIAGNOSIS — R2681 Unsteadiness on feet: Secondary | ICD-10-CM | POA: Diagnosis not present

## 2021-03-04 DIAGNOSIS — R29818 Other symptoms and signs involving the nervous system: Secondary | ICD-10-CM | POA: Diagnosis not present

## 2021-03-04 DIAGNOSIS — R2689 Other abnormalities of gait and mobility: Secondary | ICD-10-CM | POA: Diagnosis not present

## 2021-03-04 NOTE — Therapy (Signed)
[facility] [address] [place], [name], [address] Phone: [phone]   Fax:  [phone]  Physical Therapy Treatment  Patient Details  Name: Ronald Noble MRN: [id number] Date of Birth: 08-07-1945 Referring Provider (PT): Eldridge Abrahams   Encounter Date: 03/04/2021   PT End of Session - 03/04/21 1725     Visit Number 13    Number of Visits 18    Date for PT Re-Evaluation 44/96/75   per recert on 91/6/38   Authorization Type Medicare & BCBS    PT Start Time 4665    PT Stop Time 1702    PT Time Calculation (min) 45 min    Equipment Utilized During Treatment Gait belt    Activity Tolerance Patient tolerated treatment well    Behavior During Therapy WFL for tasks assessed/performed             Past Medical History:  Diagnosis Date   BPH (benign prostatic hyperplasia)    COVID 04/24/2020   scratchy throat x 1 day all symptoms resolved   Parkinson disease ([place])    Skin cancer of face    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   Sleep apnea    mild no cpap used   Urethral stricture     Past Surgical History:  Procedure Laterality Date   BURR HOLE W/ STEREOTACTIC INSERTION OF DBS LEADS / INTRAOP MICROELECTRODE RECORDING  04/2014   [facility] in [facility]   COLONOSCOPY  2019   CYSTOSCOPY WITH URETHRAL DILATATION N/A 06/15/2019   Procedure: CYSTOSCOPY WITH URETHRAL DILATATION;  Surgeon: Franchot Gallo, MD;  Location: [facility];  Service: Urology;  Laterality: N/A;  [place] N/A 05/09/2020   Procedure: CYSTOSCOPY WITH BALLOON DILATATION OF POSTERIOR URETHRAL STRICTURE, FLUOROSCOPIC INTERPRETATION;  Surgeon: Franchot Gallo, MD;  Location: [facility];  Service: Urology;  Laterality: N/A;   KNEE SURGERY  yrs ago   exploratory/left   SKIN CANCER EXCISION  01/2014   face   TRANSURETHRAL RESECTION OF  PROSTATE     2008    There were no vitals filed for this visit.   Subjective Assessment - 03/04/21 1621     Subjective Got new batteries for his DBS. Got his stitches out last week, incision healed well. Started doing exercises 3 days ago. Has had some stumbles, but no falls.    Pertinent History PD 2008, bilateral DBS (2018); hyperlipidemia    Patient Stated Goals Want to stop the falls.    Currently in Pain? No/denies    Pain Onset 1 to 4 weeks ago                               [facility] Adult PT Treatment/Exercise - 03/04/21 1727       Ambulation/Gait   Ambulation/Gait Yes    Ambulation/Gait Assistance 5: Supervision    Ambulation/Gait Assistance Details Worked on using a metronome as an auditory cue on when to step to help slow pace of gait, esp in the afternoon when pt with hastening of gait. Cued initially for posture and foot clearance. Pt did best at 65 bpm with metronome. At one point pt would begin to speed up but then would go back to the proper pace when following the beat. Discussed pt can use this technique at home to help slow down gait by using a metronome  app. when ambulating out of the clinic, pt demonstrating a more slowed pace with pt reporting thinking of counting the beats of the metronome to slow down.    Ambulation Distance (Feet) 230 Feet   x1, 115' x 2   Assistive device None    Gait Pattern Step-through pattern;Decreased arm swing - right;Decreased arm swing - left;Decreased trunk rotation;Trunk flexed    Ambulation Surface Level;Indoor    Gait Comments Showed pt how to access/download the metronome app on his phone and to have it set on the proper 65 bpm. Pt put phone in his pocket and practiced 2 laps with his own phone. Discussed making sure when pt tries to use his own phone at home that he has it secure in his pocket or has a phone holder on his belt vs. holding it. Pt verbalized understanding.      Therapeutic Activites    Therapeutic  Activities Other Therapeutic Activities    Other Therapeutic Activities Went to review how pt gets down on the floor to review his quadraped exercises with red mat on floor, pt demonstrating how he gets down with bending his knees and then rolling onto his back with momentum. Pt showing how he gets up with walking feet back to hands and then pressing up to stand. Discussed and educated pt how this is not a safe way to get up and down to the floor due to incr pressure on pt's spine, potential of hitting head/getting hurt, fall risk, and decr control getting down to floor. Showed pt and educated how to safely get up and down with using a chair with BUE support and going from half kneel>tall kneel> sitting down on floor. and how pt can use a sturdy chair next to his mat to safely get up/down. Pt adamant that he can't have any items in his "fall zone" and this would not be an option to have a chair nearby. Discussed pt bringing his spouse to next session to discuss and educate how to safely get down in the floor and have a safe space for pt to do so in their house. Or update PT exercises to modify in standing with UE support.                       PT Short Term Goals - 02/07/21 1010       PT SHORT TERM GOAL #1   Title ALL STGS = LTGS               PT Long Term Goals - 02/07/21 1009       PT LONG TERM GOAL #1   Title Pt will be independent with final progression of HEP for improved strength, balance, transfers, and gait.  TARGET 03/07/21    Baseline still need to review and update HEP, ran out of time on 02/04/21    Time 4    Period Weeks    Status On-going    Target Date 03/07/21      PT LONG TERM GOAL #2   Title Pt will verbalize plans for ongoing community fitness upon d/c from PT to maximize gains made in therapy.    Time 4    Period Weeks    Status On-going      PT LONG TERM GOAL #3   Title Pt will demonstrate at least ways to decrease hastening episodes in gait  pattern, for improved functional mobility.    Baseline Can verbalize, continues to need  cues to perform.    Time 4    Period Weeks    Status On-going      PT LONG TERM GOAL #4   Title Pt will demonstrate appropriate use of cane for indoor and household activities, to assist with gait safety, at least 200 ft, supervision.    Time 4    Status New                   Plan - 03/04/21 1736     Clinical Impression Statement Trialed a metronome today to help slow down pace of gait when pt has incr episodes of hastening, esp in the afternoon. Pt did well with an auditory cue to focus on slowed pace and foot clearance. Showed pt how to download the metronome app on his phone to use at home. PT showed and educated pt on safe technique to get on and off the floor, but pt not willing to use a chair for support despite education.    Personal Factors and Comorbidities Comorbidity 2    Comorbidities Parkinson's disease, HLD    Examination-Activity Limitations Locomotion Level;Transfers;Stand    Examination-Participation Restrictions Community Activity;Occupation    Stability/Clinical Decision Making Stable/Uncomplicated    Rehab Potential Good    PT Frequency 1x / week    PT Duration 8 weeks   per recert on 47/6/54   PT Treatment/Interventions Therapeutic activities;Therapeutic exercise;Patient/family education;Balance training;Neuromuscular re-education;DME Instruction;Gait training;Functional mobility training    PT Next Visit Plan pt to bring in his exercise folder from home - review and updated/condense. floor recovery with pt's spouse. how was metronome with gait? PWR! Up posture in standing and with gait.  continued posture and balance work to slow/decrease hastening with gait. SLS tasks, stepping over obstacles.    Consulted and Agree with Plan of Care Patient             Patient will benefit from skilled therapeutic intervention in order to improve the following deficits and  impairments:  Abnormal gait, Decreased balance, Decreased coordination, Decreased range of motion, Decreased mobility, Decreased strength, Difficulty walking, Postural dysfunction, Decreased activity tolerance, Pain  Visit Diagnosis: Other symptoms and signs involving the nervous system  Other abnormalities of gait and mobility  Unsteadiness on feet  Muscle weakness (generalized)     Problem List Patient Active Problem List   Diagnosis Date Noted   Rib pain on right side 12/10/2020   Hyperlipidemia 10/11/2020   Bilateral olecranon bursitis 07/05/2019   Other bursitis of knee, left knee 12/06/2018   Pain in right leg 12/06/2018   Parkinson disease ([place])     Arliss Journey, PT, DPT  03/04/2021, 5:38 PM  [facility] [address] [place], [name], [address] Phone: [phone]   Fax:  [phone]  Name: Ronald Noble MRN: [id number] Date of Birth: 1946-01-09

## 2021-03-11 ENCOUNTER — Encounter: Payer: Self-pay | Admitting: Physical Therapy

## 2021-03-11 ENCOUNTER — Ambulatory Visit: Payer: Medicare Other | Attending: Orthopaedic Surgery | Admitting: Physical Therapy

## 2021-03-11 ENCOUNTER — Other Ambulatory Visit: Payer: Self-pay

## 2021-03-11 DIAGNOSIS — M6281 Muscle weakness (generalized): Secondary | ICD-10-CM | POA: Insufficient documentation

## 2021-03-11 DIAGNOSIS — R29818 Other symptoms and signs involving the nervous system: Secondary | ICD-10-CM | POA: Diagnosis not present

## 2021-03-11 DIAGNOSIS — R2689 Other abnormalities of gait and mobility: Secondary | ICD-10-CM | POA: Diagnosis not present

## 2021-03-11 DIAGNOSIS — R2681 Unsteadiness on feet: Secondary | ICD-10-CM | POA: Insufficient documentation

## 2021-03-11 NOTE — Therapy (Signed)
[facility] [address] [place], [name], [address] Phone: [phone]   Fax:  [phone]  Physical Therapy Treatment  Patient Details  Name: Ronald Noble MRN: [id number] Date of Birth: 08/18/1945 Referring Provider (PT): Eldridge Abrahams   Encounter Date: 03/11/2021   PT End of Session - 03/11/21 1614     Visit Number 14    Number of Visits 18    Date for PT Re-Evaluation 73/41/93   per recert on 79/0/24   Authorization Type Medicare & BCBS    PT Start Time 1613    PT Stop Time 1658    PT Time Calculation (min) 45 min    Equipment Utilized During Treatment Gait belt    Activity Tolerance Patient tolerated treatment well    Behavior During Therapy WFL for tasks assessed/performed             Past Medical History:  Diagnosis Date   BPH (benign prostatic hyperplasia)    COVID 04/24/2020   scratchy throat x 1 day all symptoms resolved   Parkinson disease ([place])    Skin cancer of face    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   Sleep apnea    mild no cpap used   Urethral stricture     Past Surgical History:  Procedure Laterality Date   BURR HOLE W/ STEREOTACTIC INSERTION OF DBS LEADS / INTRAOP MICROELECTRODE RECORDING  04/2014   [facility], new leads replaced in 2020   COLONOSCOPY  2019   CYSTOSCOPY WITH URETHRAL DILATATION N/A 06/15/2019   Procedure: CYSTOSCOPY WITH URETHRAL DILATATION;  Surgeon: Franchot Gallo, MD;  Location: [facility];  Service: Urology;  Laterality: N/A;  [place] N/A 05/09/2020   Procedure: CYSTOSCOPY WITH BALLOON DILATATION OF POSTERIOR URETHRAL STRICTURE, FLUOROSCOPIC INTERPRETATION;  Surgeon: Franchot Gallo, MD;  Location: [facility];  Service: Urology;  Laterality: N/A;   KNEE SURGERY  yrs ago   exploratory/left   SKIN CANCER EXCISION  01/2014   face   TRANSURETHRAL RESECTION OF  PROSTATE     2008    There were no vitals filed for this visit.   Subjective Assessment - 03/11/21 1615     Subjective Had a fall in the yard, reports he worked himself to exhaustion and had nothing left in his legs. Moved too fast. Did not hurt himself, just had a couple abrasions. Tried to use the metronome a little bit.    Pertinent History PD 2008, bilateral DBS (2018); hyperlipidemia    Patient Stated Goals Want to stop the falls.    Currently in Pain? No/denies    Pain Onset 1 to 4 weeks ago                               [facility] Adult PT Treatment/Exercise - 03/11/21 2148       Ambulation/Gait   Ambulation/Gait Yes    Ambulation/Gait Assistance 4: Min guard;5: Supervision    Ambulation/Gait Assistance Details Continued to work on using a metronome as an auditory cue on when to step to help slow pace of gait, esp in the afternoon when pt with hastening of gait. With pt having metronome app on his phone, set to 65 bpm and pt having his phone in his pocket, working on slowing down pace of gait and incr foot clearance. Pt needing intermittent guard at times due to  almost bumping into objects on his R. Pt's spouse present today for education on use of a metronome at home.    Ambulation Distance (Feet) 230 Feet   x2   Assistive device None    Gait Pattern Step-through pattern;Decreased arm swing - right;Decreased arm swing - left;Decreased trunk rotation;Trunk flexed    Ambulation Surface Level;Indoor      Therapeutic Activites    Therapeutic Activities Other Therapeutic Activities    Other Therapeutic Activities With spouse present; educated and performed a more safe way to get on and off the floor - having a sturdy chair nearby and using BUE support > half kneeling and then sitting on bottom once on mat and then coming up from half kneel and using BUE support as well. Vs pt performing standing and rolling backwards onto his back with momentum (which he is currently  doing). Pt performing x2 reps with cues with supervision.             NMR:  Reviewed pt's PWR moves for HEP with spouse present:  Seated PWR UP: x10 reps Standing PWR Up: x10 reps  Initial cues for scap retraction, open hands. Educated on purpose of performing and trying to perform these daily.  Pt performs PWR! Moves in quadraped position on mat on floor:    PWR! Up for improved posture x10 reps, cues for upright posture and hip extensor activation, opening hands   PWR! Rock for improved weighshifting x10 reps  PWR! Twist for improved trunk rotation x5 reps each side, cues for breathing   PWR! Step for improved step initiation x8 reps each side (pt did better with this one after taking his shoes off)  Cues provided for technique and intensity. Showed ways for pt to also modify on the floor by having a chair in front of him as needed.         PT Education - 03/11/21 2152     Education Details Gait with metronome, safe floor <> stand transfers, reviewed PWR moves for HEP    Person(s) Educated Patient;Spouse    Methods Explanation;Demonstration;Verbal cues    Comprehension Verbalized understanding;Returned demonstration;Verbal cues required              PT Short Term Goals - 02/07/21 1010       PT SHORT TERM GOAL #1   Title ALL STGS = LTGS               PT Long Term Goals - 03/11/21 2158       PT LONG TERM GOAL #1   Title Pt will be independent with final progression of HEP for improved strength, balance, transfers, and gait.  TARGET 03/07/21    Baseline reviewed PWR moves with pt and pt's spouse, will need to review additional exercises.    Time 4    Period Weeks    Status Partially Met    Target Date 03/07/21      PT LONG TERM GOAL #2   Title Pt will verbalize plans for ongoing community fitness upon d/c from PT to maximize gains made in therapy.    Time 4    Period Weeks    Status On-going      PT LONG TERM GOAL #3   Title Pt will  demonstrate at least ways to decrease hastening episodes in gait pattern, for improved functional mobility.    Baseline using metronome, pt will need to practice more at home.    Time 4  Period Weeks    Status Partially Met      PT LONG TERM GOAL #4   Title Pt will demonstrate appropriate use of cane for indoor and household activities, to assist with gait safety, at least 200 ft, supervision.    Time 4    Status New             Ongoing LTGs:     PT Long Term Goals - 03/11/21 2158       PT LONG TERM GOAL #1   Title Pt will be independent with final progression of HEP for improved strength, balance, transfers, and gait.  TARGET 04/05/21    Baseline reviewed PWR moves with pt and pt's spouse, will need to review additional exercises.    Time 8    Period Weeks    Status On-going    Target Date 04/05/21      PT LONG TERM GOAL #2   Title Pt will verbalize plans for ongoing community fitness upon d/c from PT to maximize gains made in therapy.    Time 8    Period Weeks    Status On-going      PT LONG TERM GOAL #3   Title Pt will demonstrate at least ways to decrease hastening episodes in gait pattern, for improved functional mobility.    Baseline using metronome, pt will need to practice more at home.    Time 8    Period Weeks    Status On-going      PT LONG TERM GOAL #4   Title Pt will demonstrate appropriate use of cane for indoor and household activities, to assist with gait safety, at least 200 ft, supervision.    Time 8    Status On-going                Plan - 03/11/21 2156     Clinical Impression Statement Pt's spouse present during session today. Educated on use of metronome to help slow pt's pace of gait down during hastening episodes later on in the day. Went over improved safety with floor <> stand transfers when pt does his exercises at home - using a chair for UE support and going into half kneeling. Began to review pt's PD specific exercises for  seated/standing PWR Up and quadraped PWR moves. Will benefit from further review. Will continue to progress towards LTGs.    Personal Factors and Comorbidities Comorbidity 2    Comorbidities Parkinson's disease, HLD    Examination-Activity Limitations Locomotion Level;Transfers;Stand    Examination-Participation Restrictions Community Activity;Occupation    Stability/Clinical Decision Making Stable/Uncomplicated    Rehab Potential Good    PT Frequency 1x / week    PT Duration 8 weeks   per recert on 67/1/24   PT Treatment/Interventions Therapeutic activities;Therapeutic exercise;Patient/family education;Balance training;Neuromuscular re-education;DME Instruction;Gait training;Functional mobility training    PT Next Visit Plan pt to bring in his exercise folder from home - review and updated/condense. how was metronome with gait? PWR! Up posture in standing and with gait.  continued posture and balance work to slow/decrease hastening with gait. SLS tasks, stepping over obstacles.    Consulted and Agree with Plan of Care Patient             Patient will benefit from skilled therapeutic intervention in order to improve the following deficits and impairments:  Abnormal gait, Decreased balance, Decreased coordination, Decreased range of motion, Decreased mobility, Decreased strength, Difficulty walking, Postural dysfunction, Decreased activity tolerance, Pain  Visit Diagnosis:  Other symptoms and signs involving the nervous system  Other abnormalities of gait and mobility  Unsteadiness on feet  Muscle weakness (generalized)     Problem List Patient Active Problem List   Diagnosis Date Noted   Rib pain on right side 12/10/2020   Hyperlipidemia 10/11/2020   Bilateral olecranon bursitis 07/05/2019   Other bursitis of knee, left knee 12/06/2018   Pain in right leg 12/06/2018   Parkinson disease ([place])     Arliss Journey, PT, DPT  03/11/2021, 9:58 PM  [facility] [address] [place], [name], [address] Phone: [phone]   Fax:  [phone]  Name: Ronald Noble MRN: [id number] Date of Birth: Jul 20, 1945

## 2021-03-17 ENCOUNTER — Encounter: Payer: Self-pay | Admitting: Neurology

## 2021-03-17 ENCOUNTER — Ambulatory Visit (INDEPENDENT_AMBULATORY_CARE_PROVIDER_SITE_OTHER): Payer: Medicare Other | Admitting: Neurology

## 2021-03-17 ENCOUNTER — Other Ambulatory Visit: Payer: Self-pay

## 2021-03-17 VITALS — BP 94/55 | HR 78 | Ht 71.0 in | Wt 159.6 lb

## 2021-03-17 DIAGNOSIS — G20 Parkinson's disease: Secondary | ICD-10-CM | POA: Diagnosis not present

## 2021-03-17 DIAGNOSIS — G249 Dystonia, unspecified: Secondary | ICD-10-CM

## 2021-03-17 DIAGNOSIS — Z9689 Presence of other specified functional implants: Secondary | ICD-10-CM | POA: Diagnosis not present

## 2021-03-17 MED ORDER — ROPINIROLE HCL ER 6 MG PO TB24: 6.0000 mg | ORAL_TABLET | Freq: Every day | ORAL | 3 refills | Status: DC

## 2021-03-17 NOTE — Progress Notes (Signed)
Subjective:    Patient ID: Ronald Noble is a 75 y.o. male.  HPI    Interim history:   Ronald Noble is a 75 year old right-handed gentleman with an underlying medical history of skin cancer including melanoma, enlarged prostate (s/p TURP), who presents for followup consultation of his R sided predominant Parkinson's disease, complicated by blepharospasm, sialorrhea (receives Xeomin inj. At [facility]), RBD and sleep disturbance as well as mild dyskinesias, with s/p bilateral DBS placements at [facility], s/p L IPG replacement on the L in 09/2018. He is accompanied by his wife again today. I last saw him on 11/26/2020, at which time he reported increase in facial dyskinesias.  He had concerns about his balance and difficulty controlling his walking speed.  He had not made much in the way of changes to his medication regimen we had discussed at our last appointment.  He was advised to reduce his long-acting ropinirole to 4 mg daily.  He was advised to take Sinemet 25-100 mg strength 1 pill 7 times a day at 2-hour intervals.  He was advised to continue with amantadine 100 mg twice daily, clonazepam 0.5 mg at bedtime and melatonin at night.  He called in the interim in September 2022 reporting that the reduced ropinirole was not helping especially with his restless leg symptoms.  He wanted to get on the ropinirole long-acting 6 mg strength.  I provided an interim prescription for the 2 mg strength so he could use up his 4 mg pills.  Today, 03/17/2021: He reports that his movements are uncontrollable at times and he does not know how to stop when he starts walking.  He has facial dyskinesias which are still bothersome, botulinum toxin injections have been helpful, but he still has dyskinesias particularly after he takes his levodopa.  He is taking it approximately every 2 hours but is not always on schedule.  He has not fallen with injuries but has fallen or braced his fall, sometimes walks into a wall.  He is  working with physical therapy.  He has been using a metronome to time his steps and walking speed.  He has worked with a nutritionist to add more calories and has actually gained about 5 pounds.  He had a recent Xeomin injection for his blepharospasm and sialorrhea with [name] at [facility] in November 2022.  He has an appointment with Elbert Ewings at [facility] in January. His also wanted to discuss a recent update about his sister.  He has had mild swelling of the left eyelid for the past day or 2.  He is mildly fatigued.  He thought it was a stye but does not appear to me to be a stye. He had a L IPG replacement under Dr. Salomon Fick at [facility] on 02/12/2021.  I have reviewed the surgical records in his chart.  His wife is wondering if he is taking his levodopa too close together.  The patient's allergies, current medications, family history, past medical history, past social history, past surgical history and problem list were reviewed and updated as appropriate.    Previously (copied from previous notes for reference):     I saw him on 05/28/2020, at which time he reported that he was taking levodopa 1-1/2 pills 5 times a day but he would also add half pills at times with half a pill of levodopa every 1/2 hour in the first part of the day.  He was advised to continue with Sinemet 1 pill every 2  hours for total of 7 doses.  He was advised to continue with amantadine 100 mg twice daily, clonazepam at night, ropinirole long-acting and melatonin in the evening as well.  They had a recent death in the family, it was unexpected.   He had an interim appointment with [name] in June 2022.    I saw him on 11/23/2019, at which time he felt fairly stable.  He had one fall as he slipped on wet floor carrying groceries but thankfully did not have any injuries.  He voiced concern about his sister who was having trouble with her Parkinson's disease and we discussed this stressor at the time.  He had  discontinued Botox injections at [facility] for his blepharospasm.  He was advised to increase his clonazepam to 0.5 mg at bedtime due to increase in dream enactment behavior.   He had an interim appointment with Elbert Ewings on 03/06/2020.  She recently requested to resume Botox injections at [facility].   He had an interim appointment with Dr. Deboraha Sprang at [facility] on 09/20/19, next appointment in September and an appointment with Elbert Ewings in October.    I saw him on 05/03/2019, at which time he reported a problem with his lower back and right knee.  He had to use a cane for a while after a fall in the summer.  He had physical therapy and also a steroid injection in November 2020.  He was overall stable.  He was advised to follow-up routinely in 6 months and continue with his medications.   He emailed in the interim with concerns regarding his sisters Parkinson's disease and associated delusions and hallucinations.     I saw him on 11/01/2018, at which time he reported more problems with his balance and difficulty with freezing and also difficulty stopping when needed.  He had interim left IPG replacement on 09/23/2018.  He has been receiving Botox injections for his blepharospasm.  He did report a fall and bumped his right hip.  He had an x-ray which was negative for any bony injuries.  He was advised to monitor his hip pain.  He was advised to continue with his Parkinson's medications.    I saw him on 04/28/2018, at which time he reported doing well, had no falls, weight has been stable.  I suggested he keep his medications the same.       I saw him on 10/26/2017, at which time he reported doing okay, tried Gocovri for one month, but it was cost prohibitive. He was back on amantadine 100 mg bid. He tried the reduced dose of amantadine but did not do well on the lower dose. He reported no recent falls. Was getting Botox injections for blepharospasm through [name]. He was on Sinemet  1-1/2 pills 5 times a day, long-acting Requip once daily at 5 PM. I asked him to continue with his medication regimen.   I saw him on 04/28/2017, at which time he reported some daytime somnolence. He was supposed to get cataract surgeries and colonoscopy done. He had done well after Botox injections at [facility]. He was on amantadine twice daily and I suggested we reduce it to once daily and consider taking him off altogether. I suggested he take his long-acting Requip 6 mg once daily in the evening, instead of during the day. We kept his Sinemet at 1-1/2 pills 5 times a day.     I saw him on 10/29/2016, at which time he reported  doing okay, he had no recent falls, his left eye ptosis improved after adjustment of his DBS. He did not end up having any Botox injections to his eyelid. He was on Sinemet 1-1/2 pills 5 times a day, 3 hourly intervals, starting approximately at 8 AM. He was on amantadine twice daily and Requip XL around 1 PM.    I saw him on 04/09/2016, at which time he was doing okay, he had improvement in his blood first specimen on the left in facial dyskinesias. He had been followed at [facility]. He was started on amantadine and was taking it twice daily. He was off of Stalevo completely. He felt that his abdominal cramping and facial dyskinesias improved after he came off of Stalevo altogether. He was in the process of being scheduled for Botox injections for his blepharospasm at [facility]. He was still on once daily long-acting Requip, when he tried to stop it completely he noticed a flareup of his RLS. He denied any recent falls. He was exercising regularly. He did have some constipation but generally under control with some regularity in his bowel movements reported.    I saw him on 07/09/2015, at which time he reported no recent syncope. He had a tilt table test in January 2017 which showed abnormal findings but minimal symptoms reported. He saw Dr. Hartford Poli with vascular surgery for  consultation on 05/28/2015. He had a 24-hour blood pressure monitor. He had a follow-up appointment pending with his vascular specialist. He also had an appointment pending with [name] in October 2017. He felt he pulled a muscle in his upper back on the right. He was on Stalevo about 4 times a day, sometimes 5 times a day depending on his day to day activities. He was on long-acting Requip 6 mg once daily. He was sleeping a little better. He had been advised to start using compression stockings but had not started yet. He had noticed worsening of droopy eyelid. He mailed in December 2017 Re: Blepharospasm and treatment for this. He had been seeing an ophthalmologist.   I saw him on 03/11/2015, at which time he reported a recent fall in September 2016. He had just come back from a road trip and after being in the car for about 2-1/2 hours he got out of the car and fell to the ground for his wife even realize what happened. He fell on pavement and chipped his tooth. They were with some friends who helped. He had to have a crown placed over his chipped tooth. His DBS settings were adjusted when he had his follow-up at [facility]. His orthostatic blood pressure values were stable. Since his settings were changed he had no other syncopal spell. He did have some medication changes as well. He was no longer on clonazepam. Stalevo was 100 mg strength 4-5 times a day. Requip long-acting was 6 mg once daily. She was on long-acting melatonin 6 mg each night. He was advised to drink 4 bottles of water. He was scheduled for tilt table test as well. His wife was concerned about him climbing on ladders. We talked about gait safety and syncope risk. He was advised not to climb on any ladders or work at heights.    I saw him on 11/07/2014, at which time he reported doing reasonably well. He had nasal congestion after his fall and injury. Of note, unfortunately, he had a syncopal spell on 10/29/2014 and hit his  nose as he fell and sustained  a nasal fracture. I reviewed the emergency room records. He was referred to ENT. His laceration was repaired with Dermabond. He reported that he stood up and felt lightheaded and then passed out. He fell and hit his face. He had a CT head without contrast as well as maxillofacial CT without contrast on 10/29/2014 which showed: RIGHT and LEFT nasal bone fractures. Small displaced fracture fragment on the LEFT. No skull fracture or intracranial hemorrhage. Unremarkable appearing deep brain stimulator leads. He fell at work. He had taken Levitra at night and 2 days prior he was working hard in the yard. He is notoriously not drinking enough water he admits. He had taken his Stalevo in the morning and was due for his next dose and became lightheaded when he got up quickly from his desk. He had been sitting for about 45 minutes consistently at his desk. He did have a warning sign and kept walking instead of sitting down again. He had a follow-up appointment at [facility] on 11/02/2014 and his Requip was increased slightly again. He had re-programming of his DBS as well. He had reduced the Requip XL to 3 mg twice daily but this was increased to 4 mg twice daily. His Stalevo was at 4-6 pills daily depending on his work day. He does get sleepy at night around 9:30 and often falls asleep while watching TV. He tried Melatonin long-acting but in combination with clonazepam at night it did not work well and in fact he had insomnia. He tried to reduce his clonazepam to 0.25 mg each night but increased it back to 0.5 mg each night. For his nose fracture he saw Dr. Redmond Baseman and was scheduled for surgery. I did not change his medications. I asked him to drink more water and reminded him to change positions slowly.   I saw him on 08/07/2014, at which time he reported doing quite well after his DBS placement. His wife felt that he was doing great. He was eating well. He was not drinking enough water. He  had trace edema in his distal lower extremities. He had 2 appointments for DBS programming. His Stalevo was reduced from 6 pills a day to 4 pills a day but then he increased it back to 5 pills a day. He was off generic Sinemet. He was on Azilect once daily and Requip long-acting 8 mg twice daily.    He's been back to work. He is currently on Azilect once daily, Requip XL 8 mg twice daily, Stalevo 100 mg strength one pill 5 times a day and he has stopped the carbidopa-levodopa. I reviewed his outside records from Dr. Salomon Fick at [facility] neurosurgery. Patient had bilateral STN DBS electrodes placed on 06/01/2014. He had pulse generators placed approximately 3 weeks after that, 06/13/14.   I saw him on 02/01/2014, at which time he reported difficulty staying asleep. He had not consistently tried melatonin. He had problems with urinary urgency and was advised to try Kegel exercises by his urologist. His PCP talked to an infectious diseases specialist and it was determined he did not actually have Lyme disease.    He did not show for an appointment on 01/30/2014 - he genuinely forgot!   I saw him on 12/19/2013, at which time he reported that his medication was not working as well. He reported more freezing episodes and more off time. Of note, he had been using a new protein milkshake that he was drinking first thing in the  morning. He had lost some weight and in an effort to improve his weight loss he had started this new supplements. His primary care physician had referred him to a nutritionist. We also talked about DBS evaluation and he requested to see another specialist for DBS candidacy. I referred him to [name] at [facility] I also advised him to discontinue using the new protein supplement as I felt it may be interfering with the absorption of his Stalevo. I also suggested a trial of Parcopa half a pill 3 times a day at 11, 2 PM and 7 PM and we kept the Stalevo the  same. He was advised to use MiraLAX as needed for constipation.   I saw him on 07/31/13, at which time [name], including swimming, yoga, and weights. He has been on Stalevo 100 mg every 2 1/2 hours starting at 7:30 AM for 6 doses. He stopped clonazepam, as he felt some daytime sluggishness. He has been taking C/L 1/2 tid in between. He had no cognitive issues, but did have some "downtime" midday. I did not change his medications. He had an appointment with [name] at [facility] in June, but there was a mix up with his appointment and he does not have an appointment until next year.   In the interim, he was diagnosed with Lyme d/s in July. He was tested and treated by his PCP has has been referred to an infectious d/s doctor. He brings in his latest lying test results from 12/05/2013 which I reviewed: It looks like his IgM antibody status is negative but his IgG antibody status has not quite turn positive as far as I understand the report. That means he is probably in the subacute or early chronic phase if I interpret this correctly. He requested a sooner than scheduled appointment, due to feeling more freezing and more off time. This starts in the late morning hours but can also go into the afternoon after lunch. Cognitively and mood wise he feels stable. He does not sleep well at night. He has not consistently tried melatonin I recommended.   I saw him on 01/30/2013, at which time I felt he was stable. We talked about DBS surgery. I did not make any medication changes. He is aware of the increase risk of melanoma associated with DAs and his dermatologist is aware that he is taking Requip XL.   I first met him on 09/27/2012, which time I did not make any changes to his medication and did not order any new test as he was stable. He saw [name] at [facility] and discussed DBS in the past but was not deemed a cadidate for it at the time as he was doing well. He was  told by [name] to stay on schedule with his medication. 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 Sleep has been an issue. He goes to sleep well, but wakes up after 45 minutes to 1 hour. He is seeing a urologist. He exercises regularly.   He previously followed with [name] and was last seen by him on 05/25/2012, at which time [name] felt he was stable, but because of off time at 2 hours he increased his carbidopa-levodopa by adding half of a 10-100 mg strength 3 times a day to his regimen. He recommended decreasing his Stalevo to 125 mg from 100 mg 6 times a day.  He has been on Stalevo 125 mg 6 times a day, at 7:30, 10, 12:30, 3 PM, 5:30 PM and 8 PM. He is on clonazepam 0.5 mg at night, Requip long-acting 6 mg twice daily, rasagiline 1 mg once daily, carbidopa-levodopa 10-100 mg strength half a tablet 3 times a day, 7:30 to 10, then midday and later in the evening.   He was followed by [name] since 2008 with a history of Parkinson's disease. He has a history of increased tone and slowness as well as tremor with R sided predominance. He was initially placed on Requip XL and then rasagiline was added in October 2008. In February 2010 Stalevo was added. He had low vitamin D levels in 2008. ANA, ESR, ceruloplasmin were normal except for slightly low ceruloplasmin of 16. Urine copper test was negative. Methylmalonic acid was normal. Urine for heavy metals is normal as well. MRI brain with and without contrast on 01/07/2007 was normal. He had a sleep study in October 2008 showing PLMs and evidence of RBD. Repeat sleep study in September 2011 showed supine obstructive sleep apnea and RBD. He had a CPAP titration study in late September 2011 which was not very successful. He has been on clonazepam at night. He has not noticed any compulsive behaviors, memory loss, depression, swelling, or orthostatic dizziness. He recently had a skin biopsy showing atypical cells, concerning for  melanoma.   He works full-time as an Forensic psychologist, [employer]. He exercises regularly and does yoga.    His Past Medical History Is Significant For: Past Medical History:  Diagnosis Date   BPH (benign prostatic hyperplasia)    COVID 04/24/2020   scratchy throat x 1 day all symptoms resolved   Parkinson disease ([place])    Skin cancer of face    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   Sleep apnea    mild no cpap used   Urethral stricture     His Past Surgical History Is Significant For: Past Surgical History:  Procedure Laterality Date   BURR HOLE W/ STEREOTACTIC INSERTION OF DBS LEADS / INTRAOP MICROELECTRODE RECORDING  04/2014   [facility], new leads replaced in 2020   COLONOSCOPY  2019   [place] N/A 06/15/2019   Procedure: CYSTOSCOPY WITH URETHRAL DILATATION;  Surgeon: Franchot Gallo, MD;  Location: [facility];  Service: Urology;  Laterality: N/A;  [place] N/A 05/09/2020   Procedure: CYSTOSCOPY WITH BALLOON DILATATION OF POSTERIOR URETHRAL STRICTURE, FLUOROSCOPIC INTERPRETATION;  Surgeon: Franchot Gallo, MD;  Location: [facility];  Service: Urology;  Laterality: N/A;   KNEE SURGERY  yrs ago   exploratory/left   SKIN CANCER EXCISION  01/2014   face   TRANSURETHRAL RESECTION OF PROSTATE     2008    His Family History Is Significant For: Family History  Problem Relation Age of Onset   COPD Mother    Cancer Father        Bladder   Parkinson's disease Sister    Colon cancer Neg Hx    Esophageal cancer Neg Hx    Stomach cancer Neg Hx     His Social History Is Significant For: Social History   Socioeconomic History   Marital status: Married    Spouse name: Levada Dy   Number of children: 4   Years of education: 19   Highest education level: Not on file  Occupational History   Occupation: Insurance underwriter:  [employer]  Tobacco Use   Smoking  status: Never   Smokeless tobacco: Never  Vaping Use   Vaping Use: Never used  Substance and Sexual Activity   Alcohol use: Yes    Alcohol/week: 5.0 standard drinks    Types: 3 Glasses of wine, 2 Cans of beer per week    Comment: occasional   Drug use: No   Sexual activity: Not on file  Other Topics Concern   Not on file  Social History Narrative   Patient is right handed and resides with wife   1-2 cups of coffee a day    Social Determinants of Health   Financial Resource Strain: Not on file  Food Insecurity: Not on file  Transportation Needs: Not on file  Physical Activity: Not on file  Stress: Not on file  Social Connections: Not on file    His Allergies Are:  Allergies  Allergen Reactions   Rosuvastatin     myalgias  :   His Current Medications Are:  Outpatient Encounter Medications as of 03/17/2021  Medication Sig   amantadine (SYMMETREL) 100 MG capsule 2 (two) times daily.    aspirin EC 81 MG tablet Take 1 tablet (81 mg total) by mouth daily. Swallow whole.   carbidopa-levodopa (SINEMET IR) 25-100 MG tablet Take 1 pill 7 times a day: 8 AM, 10 AM, 12, 2 PM, 4 PM, 6 PM, 8 PM   cholecalciferol (VITAMIN D3) 25 MCG (1000 UNIT) tablet Take 2,000 Units by mouth daily.   clonazePAM (KLONOPIN) 0.25 MG disintegrating tablet DISSOLVE 2 TABLETS(0.5 MG) ON THE TONGUE AT BEDTIME   Coenzyme Q10 (COQ10) 200 MG CAPS Take 200 mg by mouth daily.   ibandronate (BONIVA) 150 MG tablet Take 150 mg by mouth every 30 (thirty) days. Take in the morning with a full glass of water, on an empty stomach, and do not take anything else by mouth or lie down for the next 30 min.   Melatonin 5 MG SUBL Place under the tongue.   rOPINIRole (REQUIP XL) 2 MG 24 hr tablet Take 1 tablet (2 mg total) by mouth at bedtime. To be used with 4 mg to make a total of 6 mg once daily.   rOPINIRole (REQUIP XL) 4 MG 24 hr tablet Take 1 tablet (4 mg total) by mouth at bedtime.   sildenafil (REVATIO) 20 MG tablet  Take 20 mg by mouth as needed.   atorvastatin (LIPITOR) 10 MG tablet Take 1 tablet (10 mg total) by mouth daily.   No facility-administered encounter medications on file as of 03/17/2021.  :  Review of Systems:  Out of a complete 14 point review of systems, all are reviewed and negative with the exception of these symptoms as listed below:   Review of Systems  Neurological:        Pt is here for parkinson's follow up. Pt states that he is walking into doors and walls. Pt states that its hard for him to stop once starting to walk. Pt states he had pacemaker placed 02/12/2021  Wife has concerns when he takes  medication his left eye shuts hard to open. Wife wants to change the time he takes his Requip and Carbidopa-levodopa.    Objective:  Neurological Exam  Physical Exam Physical Examination:   Vitals:   03/17/21 1318  BP: (!) 94/55  Pulse: 78    General Examination: The patient is a very pleasant 75 y.o. male in no acute distress. He appears  well-developed and well-nourished and well groomed.   HEENT: Normocephalic, atraumatic, pupils are equal, round and reactive to light, extraocular tracking is moderately impaired, has intermittent significant blepharospasms.  Left eyelid is swollen with mild redness, no obvious stye.  Neck is mildly rigid. Airway examination shows stable findings, mild excess moisture. His stimulator wires are tunneled on the left side.    Chest: is clear to auscultation without wheezing, rhonchi or crackles noted.    Heart: sounds are regular and normal without murmurs, rubs or gallops noted.    Abdomen: is soft, non-tender and non-distended.   Extremities: There is no obvious edema in the distal lower extremities bilaterally.    Skin: is warm and dry with no trophic changes noted. Age-related changes are noted on the skin and scars from skin cancer removals.    Musculoskeletal: exam reveals no obvious joint deformities. Upper body tilt to R seems to be  fairly stable.    Neurologically:  Mental status: The patient is awake and alert, paying good attention. He is able to provide his history but details are provided by his wife today.  His memory, attention, language and knowledge are appropriate. There is no aphasia, agnosia, apraxia or anomia. There is a mild degree of bradyphrenia. Speech is mild to moderately hypophonic with mild to moderate dysarthria today. Mood is congruent and affect is normal.    Cranial nerves are as described above under HEENT exam. Motor exam: Normal bulk, and strength for age is noted. There are mild upper body dyskinesias noted today. No resting tremor.  Tone is mild to moderately rigid with presence of cogwheeling in the right upper extremity. There is overall mild to moderate bradykinesia. There is no drift or rebound.    Fine motor skills exam: Fine motor skills are mild to moderately impaired, more pronounced on the R than L, but stable.  Cerebellar testing shows no dysmetria or intention tremor. There is no truncal or gait ataxia.  Sensory exam is intact to light touch throughout.  Gait, station and balance: He stands up from the seated position with mild difficulty and has to push himself up, requires no assistance. He is noted to lean to the right, posture is mild to moderately stooped.  He walks with decreased arm swing bilaterally. Mild dystonic posturing of the right arm. Balance is mildly impaired.  No walking aid.   Assessment and Plan:    In summary, Ronald Noble is a very pleasant 75 year old male with an underlying medical history of skin cancer including melanoma, enlarged prostate (s/p TURP), who presents for followup consultation of his R sided predominant Parkinson's disease, complicated by RBD and sleep disturbance as well as mild dyskinesias. He is s/p bilateral DBS placements at [facility], s/p L IPG replacement on the L in 09/2018 and in November 2022. He is On Sinemet, Requip long-acting and  amantadine. He has had orthostatic hypotension, syncopal spells, falls with injuries in the past, constipation and bladder hyperactivity. He had b/l DBS placements at [facility] on 06/01/14 and generator placement on 06/13/2014. Left-sided battery replacement/generator replacement in June 2020 and again on 02/12/21. 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 He has had syncopal spells in the past including falls with injuries.  We had talked about melanoma and taking Parkinson's medicine concomitantly at length before.  He was agreeable to monitoring for skin cancer with his  dermatologist. He sustained Fx of nose when he fell in his office, and then also fell in his driveway and chipped a tooth, needed a crown placed. He had to have nose surgery. He saw a vascular specialist and had a tilt table test at [facility]. He had a 24-hour blood pressure monitor test. He was started on amantadine for dyskinesias. He had a one month trial of Gocovri, but a Rx was cost prohibitive and he went back to amantadine twice a day. He tried to reduce it to once daily but found it not as effective. He has been getting regular Botox injection for blepharospasm at [facility].  He used to be on Stalevo in the past but this was stopped in 2018.  He has had intermittent facial dyskinesias, more noticeable today.  He has been taking his levodopa in half pill increments at times in the morning.  He has been on Sinemet 1 pill 7 times a day, at 2-hour intervals, he was advised to try to stretch the intervals to 2 1/2 to 3 hours if possible, he has had more dyskinesias.  He also has had difficulty with off time unfortunately.  We suggested we reduce his ropinirole long-acting to 4 mg in the hopes of decreasing his dyskinesias but he felt worse on the lower dose.  I called in a prescription for the 2 mg strength at 4 mg.  Today, I suggested we increase his  prescription back to 6 mg strength.  He has an appointment with Elbert Ewings, [place] at [facility] for appointment in January 2023.  Maybe there is additional programming adjustments he can pursue in order to help with his motor symptoms.  It is a complicated issue to get improvement of motor function and off time and not exacerbate dyskinesias.  He is advised to see his eye specialist for his eyelid swelling if possible.  I do not believe this is a simple stye.   an continue wiwe talked about the importance of follow-up prevention.  He is advised to continue with his physical therapy.  He can continue amantadine 100 mg twice daily, clonazepam 0.25 mg, 2 pills in the evening and melatonin 3 mg before bedtime.  He and his wife also wanted to discuss the challenges of advancing Parkinson's disease that his sister is going through.  We did spend some time discussing her care as well. I answered all the questions today and the patient and his wife were in agreement. I spent 40 minutes in total face-to-face time and in reviewing records during pre-charting, more than 50% of which was spent in counseling and coordination of care, reviewing test results, reviewing medications and treatment regimen and/or in discussing or reviewing the diagnosis of PD, the prognosis and treatment options. Pertinent laboratory and imaging test results that were available during this visit with the patient were reviewed by me and considered in my medical decision making (see chart for details).

## 2021-03-18 ENCOUNTER — Ambulatory Visit: Payer: Medicare Other | Admitting: Physical Therapy

## 2021-03-18 DIAGNOSIS — H0100A Unspecified blepharitis right eye, upper and lower eyelids: Secondary | ICD-10-CM | POA: Diagnosis not present

## 2021-03-18 DIAGNOSIS — H0100B Unspecified blepharitis left eye, upper and lower eyelids: Secondary | ICD-10-CM | POA: Diagnosis not present

## 2021-03-18 DIAGNOSIS — H00014 Hordeolum externum left upper eyelid: Secondary | ICD-10-CM | POA: Diagnosis not present

## 2021-03-18 DIAGNOSIS — H1032 Unspecified acute conjunctivitis, left eye: Secondary | ICD-10-CM | POA: Diagnosis not present

## 2021-03-25 ENCOUNTER — Other Ambulatory Visit: Payer: Self-pay

## 2021-03-25 ENCOUNTER — Ambulatory Visit: Payer: Medicare Other | Admitting: Physical Therapy

## 2021-03-25 ENCOUNTER — Encounter: Payer: Self-pay | Admitting: Physical Therapy

## 2021-03-25 DIAGNOSIS — M6281 Muscle weakness (generalized): Secondary | ICD-10-CM

## 2021-03-25 DIAGNOSIS — R2689 Other abnormalities of gait and mobility: Secondary | ICD-10-CM | POA: Diagnosis not present

## 2021-03-25 DIAGNOSIS — R29818 Other symptoms and signs involving the nervous system: Secondary | ICD-10-CM

## 2021-03-25 DIAGNOSIS — R2681 Unsteadiness on feet: Secondary | ICD-10-CM | POA: Diagnosis not present

## 2021-03-26 NOTE — Therapy (Signed)
[facility] [address] [place], [name], [address] Phone: [phone]   Fax:  [phone]  Physical Therapy Treatment/Discharge Summary  Patient Details  Name: Ronald Noble MRN: [id number] Date of Birth: [birth year] Referring Provider (PT): Eldridge Abrahams   Encounter Date: 03/25/2021   PT End of Session - 03/26/21 0835     Visit Number 15    Number of Visits 18    Date for PT Re-Evaluation 70/01/74   per recert on 94/4/96   Authorization Type Medicare & BCBS    PT Start Time 1618    PT Stop Time 1701    PT Time Calculation (min) 43 min    Equipment Utilized During Treatment --    Activity Tolerance Patient tolerated treatment well    Behavior During Therapy [facility] for tasks assessed/performed             Past Medical History:  Diagnosis Date   BPH (benign prostatic hyperplasia)    COVID 04/24/2020   scratchy throat x 1 day all symptoms resolved   Parkinson disease ([place])    Skin cancer of face    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   Sleep apnea    mild no cpap used   Urethral stricture     Past Surgical History:  Procedure Laterality Date   BURR HOLE W/ STEREOTACTIC INSERTION OF DBS LEADS / INTRAOP MICROELECTRODE RECORDING  04/2014   [facility], [facility] in [facility]   COLONOSCOPY  2019   CYSTOSCOPY WITH URETHRAL DILATATION N/A 06/15/2019   Procedure: CYSTOSCOPY WITH URETHRAL DILATATION;  Surgeon: Franchot Gallo, MD;  Location: [facility];  Service: Urology;  Laterality: N/A;  [place] N/A 05/09/2020   Procedure: CYSTOSCOPY WITH BALLOON DILATATION OF POSTERIOR URETHRAL STRICTURE, FLUOROSCOPIC INTERPRETATION;  Surgeon: Franchot Gallo, MD;  Location: [facility];  Service: Urology;  Laterality: N/A;   KNEE SURGERY  yrs ago   exploratory/left   SKIN CANCER EXCISION  01/2014   face   TRANSURETHRAL  RESECTION OF PROSTATE     2008    There were no vitals filed for this visit.   Subjective Assessment - 03/25/21 1619     Subjective Had a good birthday. Had an eye infection, is on medication for it. Had a couple of stumbles and loss of balances. One time was walking too fast and he fell. Has not been able to use the metronome as much due to sometimes not having his phone.    Pertinent History PD 2008, bilateral DBS (2018); hyperlipidemia    Patient Stated Goals Want to stop the falls.    Currently in Pain? Other (Comment)   "just general back soreness"   Pain Onset 1 to 4 weeks ago                            Pt performs PWR! Moves in modified quadruped position at the countertop. Discussed that pt can perform these at home in this position to modify (already performing quadruped PWR moves on the floor).  Cues for wider BOS when performing at countertop.    PWR! Up for improved posture x10 reps   PWR! Rock for improved weighshifting x10 reps   PWR! Twist for improved trunk rotation x10 reps each side, cues to reach under and across   PWR! Step for improved step initiation x10 reps each side, cues  for foot clearance and stepping forward/out at a diagonal.    Initial cues provided for technique. Pt picked up exercises easily in this position as he is already doing these on the floor in quadruped. Gave handout for home. Pt reporting feeling good after performing.    Performed x10 reps seated and standing PWR Up at start of session before standing/gait activities. Educated on relation to function with PWR Up and importance of performing daily. And as a means for pt to be more aware of his posture and know when to PWR Up during gait for improved gait mechanics.    Access Code: [email or web address] URL: [email or web address]   Reviewed bolded exercise below:   Exercises Alternating Step Forward with Support - 1 x daily - 5 x weekly - 2 sets - 10 reps - cues for  incr foot clearance and incr effort when stepping  Pt performing below exercises.  Prone Hip Extension - 1 x daily - 5 x weekly - 1-2 sets - 10 reps Prone Hip Extension with Bent Knee - 1 x daily - 5 x weekly - 1-2 sets - 10 reps   [facility] Adult PT Treatment/Exercise - 03/26/21 0859       Transfers   Sit to Stand 5: Supervision;Without upper extremity assist    Stand to Sit 5: Supervision;Without upper extremity assist    Comments Cues to PWR Up initially in standing before initiating gait.      Ambulation/Gait   Ambulation/Gait Yes    Ambulation/Gait Assistance 5: Supervision    Ambulation/Gait Assistance Details With gait reviewed tips/stratgies to lessen hastening episodes at home for incr safety with gait. Making sure that pt PWRs Up before gait and focuses on tall posture throughout. and for pt to stop and reset if noticing getting more forward.  Pt to focus on incr stride length and picking feet up. Also using a metronome set at 65 bpm is beneficial as this helps pt with an auditory cue to help slow down gait and focus on incr foot clearance. Performed with metronome with no AD and then with SPC, pt would slightly incr speed intermittently, but able to listen to auditory cue to slow down speed. Needing cues intermittently to reset with tall posture. At end of session, ambulated out of session with cane only with pt demonstrating good technique with focusing on slowed pace/foot clearance.    Ambulation Distance (Feet) 230 Feet   x2, plus additional distances   Assistive device None;Straight cane    Gait Pattern Step-through pattern;Decreased arm swing - right;Decreased arm swing - left;Decreased trunk rotation;Trunk flexed    Ambulation Surface Level;Indoor    Gait Comments Pt reports that he sometimes has not been able to use the metronome at home on his phone because he does not have his phone at times. Showed pt an option for purchase on [place] which is a clip on metronome that he can clip  onto his pants and just press the on button to turn on, that way he will have it with him at all time. Wrote directions to make sure it stays at 65 bpm.               PHYSICAL THERAPY DISCHARGE SUMMARY  Visits from Start of Care: 15  Current functional level related to goals / functional outcomes: See LTGs.   Remaining deficits: Gait abnormalities (hastening of gait that is worse in PM) and decr timing/coordination, balance impairments, postural abnormalities    Education / Equipment: HEP, fall  prevention, how to perform safe floor <> stand transfers using chair, and tips to help with hastening episodes at home.    Patient agrees to discharge. Patient goals were partially met. Patient is being discharged due to the patient's request. Pt would like to take a break from PT at this time, will schedule for return PD evals in 6 months.        PT Education - 03/26/21 0835     Education Details Scheduling in 6 months for PT/OT/ST evaluations. Mod quadruped PWR moves at the countertop (pt already doing quadruped on the floor) and gave handout for HEP as a way to modify, reviewed forward stepping to HEP, see gait section.    Person(s) Educated Patient    Methods Explanation;Demonstration;Verbal cues    Comprehension Verbalized understanding;Returned demonstration              PT Short Term Goals - 02/07/21 1010       PT SHORT TERM GOAL #1   Title ALL STGS = LTGS               PT Long Term Goals - 03/26/21 1287       PT LONG TERM GOAL #1   Title Pt will be independent with final progression of HEP for improved strength, balance, transfers, and gait.  TARGET 04/05/21    Baseline pt performing PWR moves at home, did need to review forward stepping today for home.    Time 8    Period Weeks    Status Partially Met    Target Date 04/05/21      PT LONG TERM GOAL #2   Title Pt will verbalize plans for ongoing community fitness upon d/c from PT to maximize gains made  in therapy.    Baseline pt will plan to perform current HEP, not participating in community fitness.    Time 8    Period Weeks    Status Partially Met      PT LONG TERM GOAL #3   Title Pt will demonstrate at least ways to decrease hastening episodes in gait pattern, for improved functional mobility.    Baseline using metronome, pt also verbalizes/demonstrates in session (with cues intermittently) importance on focusing on tall posture, incr stride length to help slow down gait speed.    Time 8    Period Weeks    Status Partially Met      PT LONG TERM GOAL #4   Title Pt will demonstrate appropriate use of cane for indoor and household activities, to assist with gait safety, at least 200 ft, supervision.    Baseline met with cane on 03/25/21, used metronome to help slow down gait    Time 8    Status Achieved                   Plan - 03/26/21 0910     Clinical Impression Statement Pt with last scheduled visit today. Pt reporting that he would like to take a break from therapy at this time and would like to be discharged today. Discussed with pt will get him scheduled with return PD evals in 6 months for all disciplines (since it has been a while since he has seen OT/ST), pt in agreement with plan. Reviewed remainder of pt's HEP today and showed pt that he can also perform his quadruped PWR moves modified by standing at the counter. Reviewed techniques/strategies to help with hastening of gait (esp in PM) - using metronome set  at 65 bpm to help slow down and focus on stride length/foot clearance (auditory cue works well for pt) and to focus with tall posture/deliberate foot clearance during gait. Showed pt where to purchase a clip-on metronome to his pants that he can use as he does not always have his phone on him to use it. Pt demonstrates during therapy sessions and verbalizes understanding of technique - just needs to use the tools/strategies for home. Will be discharged at this time  from PT.    Personal Factors and Comorbidities Comorbidity 2    Comorbidities Parkinson's disease, HLD    Examination-Activity Limitations Locomotion Level;Transfers;Stand    Examination-Participation Restrictions Community Activity;Occupation    Stability/Clinical Decision Making Stable/Uncomplicated    Rehab Potential Good    PT Frequency 1x / week    PT Duration 8 weeks   per recert on 58/3[birth year]   PT Treatment/Interventions Therapeutic activities;Therapeutic exercise;Patient/family education;Balance training;Neuromuscular re-education;DME Instruction;Gait training;Functional mobility training    PT Next Visit Plan D/C - evals in 6 months.    PT Home Exercise Plan [email or web address], PWR Up - seated/standing, quadruped/modified quadruped PWR moves.    Consulted and Agree with Plan of Care Patient             Patient will benefit from skilled therapeutic intervention in order to improve the following deficits and impairments:  Abnormal gait, Decreased balance, Decreased coordination, Decreased range of motion, Decreased mobility, Decreased strength, Difficulty walking, Postural dysfunction, Decreased activity tolerance, Pain  Visit Diagnosis: Other symptoms and signs involving the nervous system  Other abnormalities of gait and mobility  Unsteadiness on feet  Muscle weakness (generalized)     Problem List Patient Active Problem List   Diagnosis Date Noted   Rib pain on right side 12/10/2020   Hyperlipidemia 10/11/2020   Bilateral olecranon bursitis 07/05/2019   Other bursitis of knee, left knee 12/06/2018   Pain in right leg 12/06/2018   Parkinson disease ([place])     Arliss Journey, PT, DPT  03/26/2021, 9:24 AM  [facility] [address] [place], [name], [address] Phone: [phone]   Fax:  [phone]  Name: Ronald Noble MRN: [id number] Date of Birth: March 28, 1946

## 2021-04-01 ENCOUNTER — Emergency Department (HOSPITAL_BASED_OUTPATIENT_CLINIC_OR_DEPARTMENT_OTHER): Payer: Medicare Other

## 2021-04-01 ENCOUNTER — Encounter (HOSPITAL_BASED_OUTPATIENT_CLINIC_OR_DEPARTMENT_OTHER): Payer: Self-pay

## 2021-04-01 ENCOUNTER — Other Ambulatory Visit: Payer: Self-pay

## 2021-04-01 ENCOUNTER — Emergency Department (HOSPITAL_BASED_OUTPATIENT_CLINIC_OR_DEPARTMENT_OTHER)
Admission: EM | Admit: 2021-04-01 | Discharge: 2021-04-01 | Disposition: A | Discharge status: Home / Self Care | Payer: Medicare Other | Attending: Emergency Medicine | Admitting: Emergency Medicine

## 2021-04-01 DIAGNOSIS — S0990XA Unspecified injury of head, initial encounter: Secondary | ICD-10-CM | POA: Diagnosis not present

## 2021-04-01 DIAGNOSIS — M4312 Spondylolisthesis, cervical region: Secondary | ICD-10-CM | POA: Diagnosis not present

## 2021-04-01 DIAGNOSIS — Z79899 Other long term (current) drug therapy: Secondary | ICD-10-CM | POA: Diagnosis not present

## 2021-04-01 DIAGNOSIS — W19XXXA Unspecified fall, initial encounter: Secondary | ICD-10-CM

## 2021-04-01 DIAGNOSIS — Z85828 Personal history of other malignant neoplasm of skin: Secondary | ICD-10-CM | POA: Insufficient documentation

## 2021-04-01 DIAGNOSIS — Z7982 Long term (current) use of aspirin: Secondary | ICD-10-CM | POA: Diagnosis not present

## 2021-04-01 DIAGNOSIS — S022XXA Fracture of nasal bones, initial encounter for closed fracture: Secondary | ICD-10-CM | POA: Diagnosis not present

## 2021-04-01 DIAGNOSIS — Z8616 Personal history of COVID-19: Secondary | ICD-10-CM | POA: Insufficient documentation

## 2021-04-01 DIAGNOSIS — Z043 Encounter for examination and observation following other accident: Secondary | ICD-10-CM | POA: Diagnosis not present

## 2021-04-01 DIAGNOSIS — S0003XA Contusion of scalp, initial encounter: Secondary | ICD-10-CM | POA: Insufficient documentation

## 2021-04-01 DIAGNOSIS — M25511 Pain in right shoulder: Secondary | ICD-10-CM | POA: Diagnosis not present

## 2021-04-01 DIAGNOSIS — Z950 Presence of cardiac pacemaker: Secondary | ICD-10-CM | POA: Insufficient documentation

## 2021-04-01 DIAGNOSIS — W01198A Fall on same level from slipping, tripping and stumbling with subsequent striking against other object, initial encounter: Secondary | ICD-10-CM | POA: Diagnosis not present

## 2021-04-01 DIAGNOSIS — Y9301 Activity, walking, marching and hiking: Secondary | ICD-10-CM | POA: Insufficient documentation

## 2021-04-01 DIAGNOSIS — Y92094 Garage of other non-institutional residence as the place of occurrence of the external cause: Secondary | ICD-10-CM | POA: Diagnosis not present

## 2021-04-01 DIAGNOSIS — M19011 Primary osteoarthritis, right shoulder: Secondary | ICD-10-CM | POA: Diagnosis not present

## 2021-04-01 DIAGNOSIS — G20 Parkinson's disease: Secondary | ICD-10-CM | POA: Diagnosis not present

## 2021-04-01 MED ORDER — ACETAMINOPHEN 325 MG PO TABS: 650.0000 mg | ORAL_TABLET | Freq: Four times a day (QID) | ORAL | 0 refills | Status: AC | PRN

## 2021-04-01 NOTE — ED Provider Notes (Signed)
[facility] EMERGENCY DEPT Provider Note   CSN: [id number] Arrival date & time: 04/01/21  1447     History Chief Complaint  Patient presents with   Ronald Noble is a 75 y.o. male.  This is a 75 y.o. male with significant medical history as below, including parkinson's disease with DBS and pacemeker who presents to the ED with complaint of fall. Accompanied by spouse. Pt reports on 12/23 he was walking in the garage out to the care, lost his footing and fell to the ground. He hit the right side of his face and right shoulder at impact. No LOC, no thinners, does take 81mg  asa daily. He was able to ambulate since the fall, no n/v, no fevers or chills, no ha, no numbness or tingling, no vision changes. Family feels he has been acting more "slow" lately and was having trouble opening a door earlier today, a task that he normally can complete with ease. No other acute complaints offered today. Has not taken ASA x 3 days 2/2 fall,   The history is provided by the patient and the spouse. No language interpreter was used.  Fall Pertinent negatives include no chest pain, no abdominal pain, no headaches and no shortness of breath.      Past Medical History:  Diagnosis Date   BPH (benign prostatic hyperplasia)    COVID 04/24/2020   scratchy throat x 1 day all symptoms resolved   Parkinson disease ([place])    Skin cancer of face    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   Sleep apnea    mild no cpap used   Urethral stricture     Patient Active Problem List   Diagnosis Date Noted   Rib pain on right side 12/10/2020   Hyperlipidemia 10/11/2020   Bilateral olecranon bursitis 07/05/2019   Other bursitis of knee, left knee 12/06/2018   Pain in right leg 12/06/2018   Parkinson disease ([place])     Past Surgical History:  Procedure Laterality Date   BURR HOLE W/ STEREOTACTIC INSERTION OF DBS LEADS / INTRAOP MICROELECTRODE RECORDING  04/2014   [facility],  new leads replaced in 2020   COLONOSCOPY  2019   [place] N/A 06/15/2019   Procedure: CYSTOSCOPY WITH URETHRAL DILATATION;  Surgeon: Franchot Gallo, MD;  Location: [facility];  Service: Urology;  Laterality: N/A;  [place] N/A 05/09/2020   Procedure: CYSTOSCOPY WITH BALLOON DILATATION OF POSTERIOR URETHRAL STRICTURE, FLUOROSCOPIC INTERPRETATION;  Surgeon: Franchot Gallo, MD;  Location: [facility];  Service: Urology;  Laterality: N/A;   KNEE SURGERY  yrs ago   exploratory/left   SKIN CANCER EXCISION  01/2014   face   TRANSURETHRAL RESECTION OF PROSTATE     2008       Family History  Problem Relation Age of Onset   COPD Mother    Cancer Father        Bladder   Parkinson's disease Sister    Colon cancer Neg Hx    Esophageal cancer Neg Hx    Stomach cancer Neg Hx     Social History   Tobacco Use   Smoking status: Never   Smokeless tobacco: Never  Vaping Use   Vaping Use: Never used  Substance Use Topics   Alcohol use: Yes    Alcohol/week: 5.0 standard drinks    Types: 3 Glasses of wine, 2 Cans of beer per  week    Comment: occasional   Drug use: No    Home Medications Prior to Admission medications   Medication Sig Start Date End Date Taking? Authorizing Provider  acetaminophen (TYLENOL) 325 MG tablet Take 2 tablets (650 mg total) by mouth every 6 (six) hours as needed. 04/01/21  Yes Wynona Dove A, DO  amantadine (SYMMETREL) 100 MG capsule 2 (two) times daily.  03/16/16   [provider]  aspirin EC 81 MG tablet Take 1 tablet (81 mg total) by mouth daily. Swallow whole. 08/01/20 08/01/21  Larey Dresser, MD  atorvastatin (LIPITOR) 10 MG tablet Take 1 tablet (10 mg total) by mouth daily. 10/10/20   Larey Dresser, MD  carbidopa-levodopa (SINEMET IR) 25-100 MG tablet Take 1 pill 7 times a day: 8 AM, 10 AM, 12, 2 PM, 4 PM, 6 PM, 8 PM 11/26/20   Star Age, MD   cholecalciferol (VITAMIN D3) 25 MCG (1000 UNIT) tablet Take 2,000 Units by mouth daily.    [provider]  clonazePAM (KLONOPIN) 0.25 MG disintegrating tablet DISSOLVE 2 TABLETS(0.5 MG) ON THE TONGUE AT BEDTIME 02/19/21   Star Age, MD  Coenzyme Q10 (COQ10) 200 MG CAPS Take 200 mg by mouth daily. 08/01/20   Larey Dresser, MD  ibandronate (BONIVA) 150 MG tablet Take 150 mg by mouth every 30 (thirty) days. Take in the morning with a full glass of water, on an empty stomach, and do not take anything else by mouth or lie down for the next 30 min.    [provider]  Melatonin 5 MG SUBL Place under the tongue.    [provider]  Ropinirole HCl 6 MG TB24 Take 1 tablet (6 mg total) by mouth daily. 03/17/21   Star Age, MD  sildenafil (REVATIO) 20 MG tablet Take 20 mg by mouth as needed.    [provider]    Allergies    Rosuvastatin  Review of Systems   Review of Systems  Constitutional:  Negative for chills and fever.  HENT:  Negative for facial swelling and trouble swallowing.   Eyes:  Negative for photophobia and visual disturbance.  Respiratory:  Negative for cough and shortness of breath.   Cardiovascular:  Negative for chest pain and palpitations.  Gastrointestinal:  Negative for abdominal pain, nausea and vomiting.  Endocrine: Negative for polydipsia and polyuria.  Genitourinary:  Negative for difficulty urinating and hematuria.  Musculoskeletal:  Negative for gait problem and joint swelling.  Skin:  Positive for wound. Negative for pallor and rash.  Neurological:  Negative for syncope and headaches.  Psychiatric/Behavioral:  Negative for agitation and confusion.    Physical Exam Updated Vital Signs BP (!) 149/96    Pulse 69    Temp 98.2 F (36.8 C)    Resp 14    SpO2 98%   Physical Exam Vitals and nursing note reviewed.  Constitutional:      General: He is not in acute distress.    Appearance: He is well-developed.  HENT:      Head: Normocephalic. Contusion present. No right periorbital erythema or left periorbital erythema.     Jaw: There is normal jaw occlusion. No trismus, tenderness or swelling.      Comments: No septal hematoma  No pain on palpation to facial bones  EOMI  Globe appears intact.  No trismus or jaw malocclusion     Right Ear: External ear normal. No mastoid tenderness.     Left Ear: External ear normal.  No mastoid tenderness.     Nose:     Right Nostril: No epistaxis or septal hematoma.     Left Nostril: No epistaxis or septal hematoma.     Mouth/Throat:     Mouth: Mucous membranes are moist.  Eyes:     General: No scleral icterus.    Extraocular Movements: Extraocular movements intact.     Pupils: Pupils are equal, round, and reactive to light.  Cardiovascular:     Rate and Rhythm: Normal rate and regular rhythm.     Pulses: Normal pulses.          Radial pulses are 2+ on the right side and 2+ on the left side.       Dorsalis pedis pulses are 2+ on the right side and 2+ on the left side.     Heart sounds: Normal heart sounds.  Pulmonary:     Effort: Pulmonary effort is normal. No respiratory distress.     Breath sounds: Normal breath sounds.  Chest:    Abdominal:     General: Abdomen is flat.     Palpations: Abdomen is soft.     Tenderness: There is no abdominal tenderness.  Musculoskeletal:        General: Normal range of motion.     Cervical back: Full passive range of motion without pain and normal range of motion.     Right lower leg: No edema.     Left lower leg: No edema.     Comments: No midline spinous process tenderness to palpation. Full ROM to b/l UE. Pelvis appears stable.   Skin:    General: Skin is warm and dry.     Capillary Refill: Capillary refill takes less than 2 seconds.  Neurological:     Mental Status: He is alert and oriented to person, place, and time.     GCS: GCS eye subscore is 4. GCS verbal subscore is 5. GCS motor subscore is 6.     Cranial  Nerves: Cranial nerves 2-12 are intact.     Sensory: Sensation is intact.     Motor: Motor function is intact. No tremor or pronator drift.     Coordination: Coordination is intact.     Gait: Gait abnormal (slow).  Psychiatric:        Mood and Affect: Mood normal.        Behavior: Behavior normal.    ED Results / Procedures / Treatments   Labs (all labs ordered are listed, but only abnormal results are displayed) Labs Reviewed - No data to display  EKG None  Radiology DG Shoulder Right  Result Date: 04/01/2021 CLINICAL DATA:  Fall EXAM: RIGHT SHOULDER - 2+ VIEW COMPARISON:  Rib series 12/10/2020. FINDINGS: Mild AC joint degenerative change. No fracture or malalignment. Right lung apex is clear. Probable chronic right sixth rib fracture. IMPRESSION: No acute osseous abnormality. Probable chronic right sixth rib fracture. Electronically Signed   By: Donavan Foil M.D.   On: 04/01/2021 15:58   CT Head Wo Contrast  Result Date: 04/01/2021 CLINICAL DATA:  Fall EXAM: CT HEAD WITHOUT CONTRAST CT MAXILLOFACIAL WITHOUT CONTRAST CT CERVICAL SPINE WITHOUT CONTRAST TECHNIQUE: Multidetector CT imaging of the head, cervical spine, and maxillofacial structures were performed using the standard protocol without intravenous contrast. Multiplanar CT image reconstructions of the cervical spine and maxillofacial structures were also generated. COMPARISON:  CT head and maxillofacial 10/29/2014, no prior C-spine CT FINDINGS: CT HEAD FINDINGS Brain: No evidence of acute infarction, hemorrhage, hydrocephalus,  extra-axial collection or mass lesion/mass effect. Redemonstrated bilateral deep brain stimulators, which appear to be in unchanged position; both leads appear intact. Vascular: No hyperdense vessel. Skull: No acute fracture.  Bifrontal burr holes. Other: None. CT MAXILLOFACIAL FINDINGS Osseous: No fracture or mandibular dislocation. Sequela of prior nasal bone fractures, as seen on 10/29/2014. No  destructive process. Orbits: Negative. No traumatic or inflammatory finding. Status post bilateral lens replacements. Sinuses: Minimal mucosal thickening in the right maxillary sinus. The mastoids are well aerated. Soft tissues: Small right frontal scalp hematoma, with right periorbital, pre malar, and right face skin thickening and subcutaneous stranding CT CERVICAL SPINE FINDINGS Alignment: 3 mm anterolisthesis of C4 on C5, which appears degenerative and longstanding, given partial fusion of the right facets. No other significant listhesis. Skull base and vertebrae: No acute fracture. No primary bone lesion or focal pathologic process. Soft tissues and spinal canal: No prevertebral fluid or swelling. No visible canal hematoma. Disc levels: Multilevel degenerative changes, without significant spinal canal stenosis. Uncovertebral and facet arthropathy, which causes multilevel neural foraminal narrowing. Upper chest: No acute process. Other: None. IMPRESSION: 1.  No acute intracranial process. 2.  No acute fracture or traumatic listhesis in the cervical spine. 3. No acute facial bone fracture. Chronic nasal bone deformities are likely the sequela of the acute fractures seen on 10/29/2014. Electronically Signed   By: Merilyn Baba M.D.   On: 04/01/2021 16:03   CT Cervical Spine Wo Contrast  Result Date: 04/01/2021 CLINICAL DATA:  Fall EXAM: CT HEAD WITHOUT CONTRAST CT MAXILLOFACIAL WITHOUT CONTRAST CT CERVICAL SPINE WITHOUT CONTRAST TECHNIQUE: Multidetector CT imaging of the head, cervical spine, and maxillofacial structures were performed using the standard protocol without intravenous contrast. Multiplanar CT image reconstructions of the cervical spine and maxillofacial structures were also generated. COMPARISON:  CT head and maxillofacial 10/29/2014, no prior C-spine CT FINDINGS: CT HEAD FINDINGS Brain: No evidence of acute infarction, hemorrhage, hydrocephalus, extra-axial collection or mass lesion/mass  effect. Redemonstrated bilateral deep brain stimulators, which appear to be in unchanged position; both leads appear intact. Vascular: No hyperdense vessel. Skull: No acute fracture.  Bifrontal burr holes. Other: None. CT MAXILLOFACIAL FINDINGS Osseous: No fracture or mandibular dislocation. Sequela of prior nasal bone fractures, as seen on 10/29/2014. No destructive process. Orbits: Negative. No traumatic or inflammatory finding. Status post bilateral lens replacements. Sinuses: Minimal mucosal thickening in the right maxillary sinus. The mastoids are well aerated. Soft tissues: Small right frontal scalp hematoma, with right periorbital, pre malar, and right face skin thickening and subcutaneous stranding CT CERVICAL SPINE FINDINGS Alignment: 3 mm anterolisthesis of C4 on C5, which appears degenerative and longstanding, given partial fusion of the right facets. No other significant listhesis. Skull base and vertebrae: No acute fracture. No primary bone lesion or focal pathologic process. Soft tissues and spinal canal: No prevertebral fluid or swelling. No visible canal hematoma. Disc levels: Multilevel degenerative changes, without significant spinal canal stenosis. Uncovertebral and facet arthropathy, which causes multilevel neural foraminal narrowing. Upper chest: No acute process. Other: None. IMPRESSION: 1.  No acute intracranial process. 2.  No acute fracture or traumatic listhesis in the cervical spine. 3. No acute facial bone fracture. Chronic nasal bone deformities are likely the sequela of the acute fractures seen on 10/29/2014. Electronically Signed   By: Merilyn Baba M.D.   On: 04/01/2021 16:03   DG Chest Portable 1 View  Result Date: 04/01/2021 CLINICAL DATA:  RIGHT shoulder pain after fall today. EXAM: PORTABLE CHEST 1 VIEW COMPARISON:  Chest x-ray dated 10/29/2014. FINDINGS: Heart size and mediastinal contours are within normal limits. Lungs are clear. No pleural effusion or pneumothorax is  seen. Visualized osseous structures about the chest are unremarkable. IMPRESSION: No acute findings.  Lungs are clear. Electronically Signed   By: Franki Cabot M.D.   On: 04/01/2021 15:56   CT Maxillofacial Wo Contrast  Result Date: 04/01/2021 CLINICAL DATA:  Fall EXAM: CT HEAD WITHOUT CONTRAST CT MAXILLOFACIAL WITHOUT CONTRAST CT CERVICAL SPINE WITHOUT CONTRAST TECHNIQUE: Multidetector CT imaging of the head, cervical spine, and maxillofacial structures were performed using the standard protocol without intravenous contrast. Multiplanar CT image reconstructions of the cervical spine and maxillofacial structures were also generated. COMPARISON:  CT head and maxillofacial 10/29/2014, no prior C-spine CT FINDINGS: CT HEAD FINDINGS Brain: No evidence of acute infarction, hemorrhage, hydrocephalus, extra-axial collection or mass lesion/mass effect. Redemonstrated bilateral deep brain stimulators, which appear to be in unchanged position; both leads appear intact. Vascular: No hyperdense vessel. Skull: No acute fracture.  Bifrontal burr holes. Other: None. CT MAXILLOFACIAL FINDINGS Osseous: No fracture or mandibular dislocation. Sequela of prior nasal bone fractures, as seen on 10/29/2014. No destructive process. Orbits: Negative. No traumatic or inflammatory finding. Status post bilateral lens replacements. Sinuses: Minimal mucosal thickening in the right maxillary sinus. The mastoids are well aerated. Soft tissues: Small right frontal scalp hematoma, with right periorbital, pre malar, and right face skin thickening and subcutaneous stranding CT CERVICAL SPINE FINDINGS Alignment: 3 mm anterolisthesis of C4 on C5, which appears degenerative and longstanding, given partial fusion of the right facets. No other significant listhesis. Skull base and vertebrae: No acute fracture. No primary bone lesion or focal pathologic process. Soft tissues and spinal canal: No prevertebral fluid or swelling. No visible canal  hematoma. Disc levels: Multilevel degenerative changes, without significant spinal canal stenosis. Uncovertebral and facet arthropathy, which causes multilevel neural foraminal narrowing. Upper chest: No acute process. Other: None. IMPRESSION: 1.  No acute intracranial process. 2.  No acute fracture or traumatic listhesis in the cervical spine. 3. No acute facial bone fracture. Chronic nasal bone deformities are likely the sequela of the acute fractures seen on 10/29/2014. Electronically Signed   By: Merilyn Baba M.D.   On: 04/01/2021 16:03    Procedures Procedures   Medications Ordered in ED Medications - No data to display  ED Course  I have reviewed the triage vital signs and the nursing notes.  Pertinent labs & imaging results that were available during my care of the patient were reviewed by me and considered in my medical decision making (see chart for details).    MDM Rules/Calculators/A&P                          CC: fall, facial injury  This patient complains of above; this involves an extensive number of treatment options and is a complaint that carries with it a high risk of complications and morbidity. Vital signs were reviewed. Serious etiologies considered.  Record review:   Previous records obtained and reviewed   Additional history obtained from spouse  Work up as above, notable for:  imaging results that were available during my care of the patient were reviewed by me and considered in my medical decision making.   I ordered imaging studies which included CTH, CT face, CT c-spine, XR r shoulder/chest and I independently visualized and interpreted imaging which showed no acute process    Reassessment:  Pt is ambulatory, at  baseline per family at bedside.    Patient presents with low mechanism head trauma. On initial evaluation patient appears in no acute distress, afebrile with normal vital signs. Patient has completely intact neurovascular exam, and pain  improved in ED.     Discussed possible etiology of concussion, signs and symptoms, and discharge instructions. Detailed discussions were had with the patient regarding current findings, and need for close f/u with PCP or on call doctor. The patient has been instructed to return immediately if the symptoms worsen in any way for re-evaluation. Patient verbalized understanding and is in agreement with current care plan. All questions answered prior to discharge      This chart was dictated using voice recognition software.  Despite best efforts to proofread,  errors can occur which can change the documentation meaning.    Final Clinical Impression(s) / ED Diagnoses Final diagnoses:  Fall, initial encounter  Contusion of scalp, initial encounter  Closed head injury, initial encounter    Rx / DC Orders ED Discharge Orders          Ordered    acetaminophen (TYLENOL) 325 MG tablet  Every 6 hours PRN        04/01/21 1752             Jeanell Sparrow, DO 04/01/21 1754

## 2021-04-01 NOTE — Discharge Instructions (Addendum)
Based on the events which brought you to the ER today, it is possible that you may have a concussion. A concussion occurs when there is a blow to the head or body, with enough force to shake the brain and disrupt how the brain functions. You may experience symptoms such as headaches, sensitivity to light/noise, dizziness, cognitive slowing, difficulty concentrating / remembering, trouble sleeping and drowsiness. These symptoms may last anywhere from hours/days to potentially weeks/months. While these symptoms are very frustrating and perhaps debilitating, it is important that you remember that they will improve over time. Everyone has a different rate of recovery; it is difficult to predict when your symptoms will resolve. In order to allow for your brain to heal after the injury, we recommend that you see your primary physician or a physician knowledgeable in concussion management. We also advise you to let your body and brain rest: avoid physical activities (sports, gym, and exercise) and reduce cognitive demands (reading, texting, TV watching, computer use, video games, etc). School attendance, after-school activities and work may need to be modified to avoid increasing symptoms. We recommend against driving until until all symptoms have resolved. You should take 650mg  of Acetaminophen (Tylenol) every 4 hours as needed for pain control; however, taking anti-inflammatory medication (Motrin/Advil/Ibuprofen) is not advised. Come back to the ER right away if you are having repeated episodes of vomiting, severe/worsening headache/dizziness or any other symptom that alarms you. We recommended that someone stay with you for the next 24 hours to monitor for these worrisome symptoms.

## 2021-04-01 NOTE — ED Triage Notes (Signed)
Pt sent here by PCP and Cardiologist for evaluation of a fall from 03/28/2021 at 0730.  Pt presents with bruising around his Right eye. Family feels pt is not responding as he normally does. Pt takes ASA 81mg  but has since stopped per request of Cardiology d/t fall.  Pt has a hx of parkinson w/bilateral brain stimulator and pacemaker stimulator that charges brain stimulator

## 2021-04-01 NOTE — ED Notes (Signed)
ED Provider at bedside. 

## 2021-04-03 DIAGNOSIS — H01004 Unspecified blepharitis left upper eyelid: Secondary | ICD-10-CM | POA: Diagnosis not present

## 2021-04-03 DIAGNOSIS — H00014 Hordeolum externum left upper eyelid: Secondary | ICD-10-CM | POA: Diagnosis not present

## 2021-04-10 ENCOUNTER — Telehealth: Payer: Self-pay | Admitting: Neurology

## 2021-04-10 MED ORDER — AMANTADINE HCL 100 MG PO CAPS: 100.0000 mg | ORAL_CAPSULE | Freq: Two times a day (BID) | ORAL | 0 refills | Status: DC

## 2021-04-10 NOTE — Telephone Encounter (Signed)
Thank you :)

## 2021-04-10 NOTE — Telephone Encounter (Signed)
I reviewed the chart.  [facility] started prescribing his medication back in 2017.  Dr. Rexene Alberts has never prescribed it.  I called the patient to discuss and I was informed that he ran out of the medication yesterday and has missed his 8 AM and 2 PM dose today.  He states he is freezing up which is atypical.  However he is stating he has never run out of amantadine before. They called [facility] this AM and are awaiting to hear back. His wife is asking for an emergency refill of amantadine while they wait for more refills from [facility].  Patient states he requested the refill 2 or 3 days ago.  I let him know I would certainly check with Dr. Rexene Alberts and give him a call back.

## 2021-04-10 NOTE — Telephone Encounter (Signed)
Pt is requesting a refill for amantadine (SYMMETREL) 100 MG capsule .  Pharmacy: [facility] [phone]

## 2021-04-10 NOTE — Telephone Encounter (Signed)
Per Dr. Rexene Alberts, okay to send in an emergency 30-day supply of amantadine 100 mg.  Since patient has missed both doses today, he can take a dose as soon as he fills it today and then resume his regular 8 AM and 2 PM schedule tomorrow.  I spoke with the patient and let him know.  He was very Patent attorney. 30 day supply (60 caps) sent to [facility].

## 2021-04-10 NOTE — Addendum Note (Signed)
Addended by: Gildardo Griffes on: 04/10/2021 05:14 PM   Modules accepted: Orders

## 2021-04-11 DIAGNOSIS — Z20822 Contact with and (suspected) exposure to covid-19: Secondary | ICD-10-CM | POA: Diagnosis not present

## 2021-04-11 DIAGNOSIS — G249 Dystonia, unspecified: Secondary | ICD-10-CM | POA: Diagnosis not present

## 2021-04-11 DIAGNOSIS — G20 Parkinson's disease: Secondary | ICD-10-CM | POA: Diagnosis not present

## 2021-04-11 DIAGNOSIS — Z79899 Other long term (current) drug therapy: Secondary | ICD-10-CM | POA: Diagnosis not present

## 2021-04-11 DIAGNOSIS — Z9682 Presence of neurostimulator: Secondary | ICD-10-CM | POA: Diagnosis not present

## 2021-04-17 DIAGNOSIS — H0100A Unspecified blepharitis right eye, upper and lower eyelids: Secondary | ICD-10-CM | POA: Diagnosis not present

## 2021-04-17 DIAGNOSIS — H0100B Unspecified blepharitis left eye, upper and lower eyelids: Secondary | ICD-10-CM | POA: Diagnosis not present

## 2021-04-17 DIAGNOSIS — H0014 Chalazion left upper eyelid: Secondary | ICD-10-CM | POA: Diagnosis not present

## 2021-04-23 DIAGNOSIS — S0502XA Injury of conjunctiva and corneal abrasion without foreign body, left eye, initial encounter: Secondary | ICD-10-CM | POA: Diagnosis not present

## 2021-04-23 DIAGNOSIS — H1820 Unspecified corneal edema: Secondary | ICD-10-CM | POA: Diagnosis not present

## 2021-04-25 DIAGNOSIS — N35011 Post-traumatic bulbous urethral stricture: Secondary | ICD-10-CM | POA: Diagnosis not present

## 2021-04-26 DIAGNOSIS — Z23 Encounter for immunization: Secondary | ICD-10-CM | POA: Diagnosis not present

## 2021-04-30 DIAGNOSIS — H0100A Unspecified blepharitis right eye, upper and lower eyelids: Secondary | ICD-10-CM | POA: Diagnosis not present

## 2021-04-30 DIAGNOSIS — H0100B Unspecified blepharitis left eye, upper and lower eyelids: Secondary | ICD-10-CM | POA: Diagnosis not present

## 2021-04-30 DIAGNOSIS — H16103 Unspecified superficial keratitis, bilateral: Secondary | ICD-10-CM | POA: Diagnosis not present

## 2021-05-07 DIAGNOSIS — G20 Parkinson's disease: Secondary | ICD-10-CM | POA: Diagnosis not present

## 2021-05-07 DIAGNOSIS — G245 Blepharospasm: Secondary | ICD-10-CM | POA: Diagnosis not present

## 2021-05-07 DIAGNOSIS — K117 Disturbances of salivary secretion: Secondary | ICD-10-CM | POA: Diagnosis not present

## 2021-05-07 DIAGNOSIS — Z9682 Presence of neurostimulator: Secondary | ICD-10-CM | POA: Diagnosis not present

## 2021-05-09 DIAGNOSIS — H26492 Other secondary cataract, left eye: Secondary | ICD-10-CM | POA: Diagnosis not present

## 2021-05-09 DIAGNOSIS — H35373 Puckering of macula, bilateral: Secondary | ICD-10-CM | POA: Diagnosis not present

## 2021-05-09 DIAGNOSIS — H0100A Unspecified blepharitis right eye, upper and lower eyelids: Secondary | ICD-10-CM | POA: Diagnosis not present

## 2021-05-09 DIAGNOSIS — H16122 Filamentary keratitis, left eye: Secondary | ICD-10-CM | POA: Diagnosis not present

## 2021-06-04 ENCOUNTER — Other Ambulatory Visit: Payer: Self-pay

## 2021-06-04 ENCOUNTER — Emergency Department (HOSPITAL_BASED_OUTPATIENT_CLINIC_OR_DEPARTMENT_OTHER): Payer: Medicare Other | Admitting: Radiology

## 2021-06-04 ENCOUNTER — Emergency Department (HOSPITAL_BASED_OUTPATIENT_CLINIC_OR_DEPARTMENT_OTHER)
Admission: EM | Admit: 2021-06-04 | Discharge: 2021-06-04 | Disposition: A | Discharge status: Home / Self Care | Payer: Medicare Other | Attending: Emergency Medicine | Admitting: Emergency Medicine

## 2021-06-04 ENCOUNTER — Encounter (HOSPITAL_BASED_OUTPATIENT_CLINIC_OR_DEPARTMENT_OTHER): Payer: Self-pay

## 2021-06-04 DIAGNOSIS — S0083XA Contusion of other part of head, initial encounter: Secondary | ICD-10-CM

## 2021-06-04 DIAGNOSIS — Z85828 Personal history of other malignant neoplasm of skin: Secondary | ICD-10-CM | POA: Diagnosis not present

## 2021-06-04 DIAGNOSIS — S63287A Dislocation of proximal interphalangeal joint of left little finger, initial encounter: Secondary | ICD-10-CM | POA: Diagnosis not present

## 2021-06-04 DIAGNOSIS — Z8616 Personal history of COVID-19: Secondary | ICD-10-CM | POA: Diagnosis not present

## 2021-06-04 DIAGNOSIS — M79645 Pain in left finger(s): Secondary | ICD-10-CM | POA: Insufficient documentation

## 2021-06-04 DIAGNOSIS — W06XXXA Fall from bed, initial encounter: Secondary | ICD-10-CM | POA: Insufficient documentation

## 2021-06-04 DIAGNOSIS — S0181XA Laceration without foreign body of other part of head, initial encounter: Secondary | ICD-10-CM | POA: Insufficient documentation

## 2021-06-04 DIAGNOSIS — S0012XA Contusion of left eyelid and periocular area, initial encounter: Secondary | ICD-10-CM

## 2021-06-04 DIAGNOSIS — S01112A Laceration without foreign body of left eyelid and periocular area, initial encounter: Secondary | ICD-10-CM | POA: Diagnosis not present

## 2021-06-04 DIAGNOSIS — R296 Repeated falls: Secondary | ICD-10-CM | POA: Insufficient documentation

## 2021-06-04 DIAGNOSIS — Z7982 Long term (current) use of aspirin: Secondary | ICD-10-CM | POA: Insufficient documentation

## 2021-06-04 DIAGNOSIS — S6992XA Unspecified injury of left wrist, hand and finger(s), initial encounter: Secondary | ICD-10-CM | POA: Diagnosis present

## 2021-06-04 DIAGNOSIS — G20 Parkinson's disease: Secondary | ICD-10-CM | POA: Diagnosis not present

## 2021-06-04 DIAGNOSIS — Z8546 Personal history of malignant neoplasm of prostate: Secondary | ICD-10-CM | POA: Diagnosis not present

## 2021-06-04 NOTE — ED Provider Notes (Signed)
[facility] Provider Note: Georgena Spurling, MD, FACEP  CSN: [id number] MRN: [id number] ARRIVAL: 06/04/21 at [place]: [place]  [place]   HISTORY OF PRESENT ILLNESS  06/04/21 3:16 AM Ronald Noble is a 76 y.o. male with Parkinson's disease and frequent falls.  He fell yesterday and has an abrasion or superficial laceration to his right temple.  This morning just prior to arrival he awakened on the floor having presumably fallen out of bed.  He struck his left eye on the bedside table and has a laceration to his left upper eyelid with associated swelling and ecchymosis.  He rates associated pain as a 3 out of 10 he denies visual changes.  He denies neck pain.  He has a deformity of his left little finger at the PIP joint.  He rates this pain as a 2 out of 10, throbbing in nature.  He has limited range of motion at the PIP joint.  Tetanus is up-to-date.   Past Medical History:  Diagnosis Date   BPH (benign prostatic hyperplasia)    COVID 04/24/2020   scratchy throat x 1 day all symptoms resolved   Parkinson disease ([place])    Skin cancer of face    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   Sleep apnea    mild no cpap used   Urethral stricture     Past Surgical History:  Procedure Laterality Date   BURR HOLE W/ STEREOTACTIC INSERTION OF DBS LEADS / INTRAOP MICROELECTRODE RECORDING  04/2014   [facility], [facility] in 2020   COLONOSCOPY  2019   [place] N/A 06/15/2019   Procedure: CYSTOSCOPY WITH URETHRAL DILATATION;  Surgeon: Franchot Gallo, MD;  Location: [facility];  Service: Urology;  Laterality: N/A;  [place] N/A 05/09/2020   Procedure: CYSTOSCOPY WITH BALLOON DILATATION OF POSTERIOR URETHRAL STRICTURE, FLUOROSCOPIC INTERPRETATION;  Surgeon: Franchot Gallo, MD;  Location: [facility];  Service: Urology;  Laterality: N/A;   KNEE SURGERY   yrs ago   exploratory/left   SKIN CANCER EXCISION  01/2014   face   TRANSURETHRAL RESECTION OF PROSTATE     2008    Family History  Problem Relation Age of Onset   COPD Mother    Cancer Father        Bladder   Parkinson's disease Sister    Colon cancer Neg Hx    Esophageal cancer Neg Hx    Stomach cancer Neg Hx     Social History   Tobacco Use   Smoking status: Never   Smokeless tobacco: Never  Vaping Use   Vaping Use: Never used  Substance Use Topics   Alcohol use: Yes    Alcohol/week: 5.0 standard drinks    Types: 3 Glasses of wine, 2 Cans of beer per week    Comment: occasional   Drug use: No    Prior to Admission medications   Medication Sig Start Date End Date Taking? Authorizing Provider  acetaminophen (TYLENOL) 325 MG tablet Take 2 tablets (650 mg total) by mouth every 6 (six) hours as needed. 04/01/21   Jeanell Sparrow, DO  amantadine (SYMMETREL) 100 MG capsule Take 1 capsule (100 mg total) by mouth 2 (two) times daily. 04/10/21   Star Age, MD  aspirin EC 81 MG tablet Take 1 tablet (81 mg total) by mouth daily. Swallow whole. 08/01/20 08/01/21  Larey Dresser, MD  atorvastatin (LIPITOR) 10 MG tablet Take 1 tablet (10 mg total) by mouth daily. 10/10/20   Larey Dresser, MD  carbidopa-levodopa (SINEMET IR) 25-100 MG tablet Take 1 pill 7 times a day: 8 AM, 10 AM, 12, 2 PM, 4 PM, 6 PM, 8 PM 11/26/20   Star Age, MD  cholecalciferol (VITAMIN D3) 25 MCG (1000 UNIT) tablet Take 2,000 Units by mouth daily.    [provider]  clonazePAM (KLONOPIN) 0.25 MG disintegrating tablet DISSOLVE 2 TABLETS(0.5 MG) ON THE TONGUE AT BEDTIME 02/19/21   Star Age, MD  Coenzyme Q10 (COQ10) 200 MG CAPS Take 200 mg by mouth daily. 08/01/20   Larey Dresser, MD  ibandronate (BONIVA) 150 MG tablet Take 150 mg by mouth every 30 (thirty) days. Take in the morning with a full glass of water, on an empty stomach, and do not take anything else by mouth or lie down for the next 30  min.    [provider]  Melatonin 5 MG SUBL Place under the tongue.    [provider]  Ropinirole HCl 6 MG TB24 Take 1 tablet (6 mg total) by mouth daily. 03/17/21   Star Age, MD  sildenafil (REVATIO) 20 MG tablet Take 20 mg by mouth as needed.    [provider]    Allergies Rosuvastatin   REVIEW OF SYSTEMS  Negative except as noted here or in the History of Present Illness.   PHYSICAL EXAMINATION  Initial Vital Signs Blood pressure (!) 146/83, pulse 64, temperature 98.2 F (36.8 C), temperature source Oral, resp. rate 16, height 5\' 11"  (1.803 m), weight 73 kg, SpO2 98 %.  Examination General: Well-developed, well-nourished male in no acute distress; appearance consistent with age of record HENT: normocephalic; abrasion and small hematoma right temple, superficial laceration and hematoma of left eyelid:   Eyes: pupils equal, round and reactive to light; extraocular muscles intact; no hyphema Neck: supple; nontender Heart: regular rate and rhythm Lungs: clear to auscultation bilaterally Abdomen: soft; nondistended; nontender; bowel sounds present Extremities: Rigidity; step-off dislocation of left fifth finger PIP consistent with dorsal dislocation of middle phalanx Neurologic: Awake, alert; motor function intact in all extremities and symmetric; no facial droop Skin: Warm and dry Psychiatric: Normal mood and affect   RESULTS  Summary of this visit's results, reviewed and interpreted by myself:   EKG Interpretation  Date/Time:    Ventricular Rate:    PR Interval:    QRS Duration:   QT Interval:    QTC Calculation:   R Axis:     Text Interpretation:         Laboratory Studies: No results found for this or any previous visit (from the past 24 hour(s)). Imaging Studies: DG Finger Little Left  Result Date: 06/04/2021 CLINICAL DATA:  Reduction of dislocation EXAM: LEFT LITTLE FINGER 2+V COMPARISON:  None. FINDINGS: Anatomic alignment  following reduction of left little finger dislocation. No fracture. IMPRESSION: Anatomic alignment of the left little finger following reduction. Electronically Signed   By: Ulyses Jarred M.D.   On: 06/04/2021 03:59    ED COURSE and MDM  Nursing notes, initial and subsequent vitals signs, including pulse oximetry, reviewed and interpreted by myself.  Vitals:   06/04/21 0253 06/04/21 0345  BP: (!) 146/83 122/71  Pulse: 64 (!) 57  Resp: 16 18  Temp: 98.2 F (36.8 C)   TempSrc: Oral   SpO2: 98% 97%  Weight: 73 kg   Height: 5\' 11"  (1.803 m)  Medications - No data to display    PROCEDURES  .Ortho Injury Treatment  Date/Time: 06/04/2021 3:26 AM Performed by: Veronika Heard, MD Authorized by: Adolfo Granieri, MD   Consent:    Consent obtained:  Verbal   Consent given by:  Patient   Risks discussed:  Irreducible dislocationInjury location: finger Location details: left little finger Injury type: dislocation Dislocation type: PIP Pre-procedure neurovascular assessment: neurovascularly intact Pre-procedure distal perfusion: normal Pre-procedure neurological function: normal Pre-procedure range of motion: reduced  Anesthesia: Local anesthesia used: no  Patient sedated: NoManipulation performed: yes Reduction successful: yes X-ray confirmed reduction: yes Immobilization: splint Splint type: static finger Splint Applied by: ED Tech Supplies used: aluminum splint Post-procedure neurovascular assessment: post-procedure neurovascularly intact Post-procedure distal perfusion: normal Post-procedure neurological function: normal Post-procedure range of motion: improved  LACERATION REPAIR Performed by: Karen Chafe Murle Otting Authorized by: Karen Chafe Kellyn Mccary Consent: Verbal consent obtained. Risks and benefits: risks, benefits and alternatives were discussed Consent given by: patient Patient identity confirmed: provided demographic data Prepped and Draped in normal sterile fashion Wound  explored  Laceration Location: Left upper eyelid  Laceration Length: 2 cm  No Foreign Bodies seen or palpated  Anesthesia: None  Irrigation method: syringe Amount of cleaning: standard  Skin closure: Dermabond  Patient tolerance: Patient tolerated the procedure well with no immediate complications.  LACERATION REPAIR Performed by: Karen Chafe Anderson Coppock Authorized by: Karen Chafe Linville Decarolis Consent: Verbal consent obtained. Risks and benefits: risks, benefits and alternatives were discussed Consent given by: patient Patient identity confirmed: provided demographic data Prepped and Draped in normal sterile fashion Wound explored  Laceration Location: Right temple  Laceration Length: 1 cm  No Foreign Bodies seen or palpated  Anesthesia: None  Irrigation method: syringe Amount of cleaning: standard  Skin closure: Dermabond  Patient tolerance: Patient tolerated the procedure well with no immediate complications.   ED DIAGNOSES     ICD-10-CM   1. Fall from bed, initial encounter  W06.XXXA     2. Laceration of skin of left eyelid, initial encounter  S01.112A     3. Contusion of left eyelid, initial encounter  S00.12XA     4. Laceration of temple, initial encounter  S01.81XA     5. Contusion of temple region, initial encounter  S00.83XA     6. Dislocation of proximal interphalangeal joint of left little finger, initial encounter  H41.937T          Shanon Rosser, MD 06/04/21 [phone]

## 2021-06-04 NOTE — ED Notes (Signed)
Pt and wife verbalizes understanding of discharge instructions. Opportunity for questioning and answers were provided. Pt discharged from ED to home with wife.   ? ?

## 2021-06-04 NOTE — ED Triage Notes (Signed)
Rolled out of bed onto carpeted floor possibly striking his head bedside table and injuring left hand and lacerated left upper eyelid. ?

## 2021-06-24 DIAGNOSIS — Z20822 Contact with and (suspected) exposure to covid-19: Secondary | ICD-10-CM | POA: Diagnosis not present

## 2021-06-26 DIAGNOSIS — E785 Hyperlipidemia, unspecified: Secondary | ICD-10-CM | POA: Diagnosis not present

## 2021-06-26 DIAGNOSIS — E559 Vitamin D deficiency, unspecified: Secondary | ICD-10-CM | POA: Diagnosis not present

## 2021-06-26 DIAGNOSIS — Z125 Encounter for screening for malignant neoplasm of prostate: Secondary | ICD-10-CM | POA: Diagnosis not present

## 2021-06-26 DIAGNOSIS — R7989 Other specified abnormal findings of blood chemistry: Secondary | ICD-10-CM | POA: Diagnosis not present

## 2021-06-30 DIAGNOSIS — Z1212 Encounter for screening for malignant neoplasm of rectum: Secondary | ICD-10-CM | POA: Diagnosis not present

## 2021-07-02 DIAGNOSIS — R82998 Other abnormal findings in urine: Secondary | ICD-10-CM | POA: Diagnosis not present

## 2021-07-02 DIAGNOSIS — M8580 Other specified disorders of bone density and structure, unspecified site: Secondary | ICD-10-CM | POA: Diagnosis not present

## 2021-07-02 DIAGNOSIS — R269 Unspecified abnormalities of gait and mobility: Secondary | ICD-10-CM | POA: Diagnosis not present

## 2021-07-02 DIAGNOSIS — N401 Enlarged prostate with lower urinary tract symptoms: Secondary | ICD-10-CM | POA: Diagnosis not present

## 2021-07-02 DIAGNOSIS — G20 Parkinson's disease: Secondary | ICD-10-CM | POA: Diagnosis not present

## 2021-07-02 DIAGNOSIS — Z1331 Encounter for screening for depression: Secondary | ICD-10-CM | POA: Diagnosis not present

## 2021-07-02 DIAGNOSIS — H9190 Unspecified hearing loss, unspecified ear: Secondary | ICD-10-CM | POA: Diagnosis not present

## 2021-07-02 DIAGNOSIS — Z832 Family history of diseases of the blood and blood-forming organs and certain disorders involving the immune mechanism: Secondary | ICD-10-CM | POA: Diagnosis not present

## 2021-07-02 DIAGNOSIS — E559 Vitamin D deficiency, unspecified: Secondary | ICD-10-CM | POA: Diagnosis not present

## 2021-07-02 DIAGNOSIS — E785 Hyperlipidemia, unspecified: Secondary | ICD-10-CM | POA: Diagnosis not present

## 2021-07-02 DIAGNOSIS — Z1339 Encounter for screening examination for other mental health and behavioral disorders: Secondary | ICD-10-CM | POA: Diagnosis not present

## 2021-07-02 DIAGNOSIS — Z0000 Encounter for general adult medical examination without abnormal findings: Secondary | ICD-10-CM | POA: Diagnosis not present

## 2021-07-02 DIAGNOSIS — Z23 Encounter for immunization: Secondary | ICD-10-CM | POA: Diagnosis not present

## 2021-07-02 DIAGNOSIS — R5383 Other fatigue: Secondary | ICD-10-CM | POA: Diagnosis not present

## 2021-07-10 DIAGNOSIS — Z20822 Contact with and (suspected) exposure to covid-19: Secondary | ICD-10-CM | POA: Diagnosis not present

## 2021-07-16 ENCOUNTER — Encounter: Payer: Self-pay | Admitting: Neurology

## 2021-07-16 ENCOUNTER — Ambulatory Visit (INDEPENDENT_AMBULATORY_CARE_PROVIDER_SITE_OTHER): Payer: Medicare Other | Admitting: Neurology

## 2021-07-16 VITALS — BP 106/61 | HR 71 | Ht 71.0 in | Wt 159.4 lb

## 2021-07-16 DIAGNOSIS — G20 Parkinson's disease: Secondary | ICD-10-CM

## 2021-07-16 DIAGNOSIS — G4752 REM sleep behavior disorder: Secondary | ICD-10-CM

## 2021-07-16 DIAGNOSIS — R296 Repeated falls: Secondary | ICD-10-CM | POA: Diagnosis not present

## 2021-07-16 DIAGNOSIS — Z9689 Presence of other specified functional implants: Secondary | ICD-10-CM

## 2021-07-16 DIAGNOSIS — G20A1 Parkinson's disease without dyskinesia, without mention of fluctuations: Secondary | ICD-10-CM

## 2021-07-16 DIAGNOSIS — G249 Dystonia, unspecified: Secondary | ICD-10-CM | POA: Diagnosis not present

## 2021-07-16 NOTE — Patient Instructions (Signed)
It was nice to see you both again today.  As discussed, we will try to taper you off the amantadine, reduce it to once daily for 1 week and then you can stop it.  You can continue with your Sinemet 1-1/2 pills 5 times a day as per current regimen.  We will continue with your ropinirole 6 mg strength also.  Please follow-up routinely in 6 months, sooner if needed.  I would recommend that you get your driving skills evaluated as planned through [facility].  Please also plan to get your U step walker. ?
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
## 2021-07-18 DIAGNOSIS — Z20822 Contact with and (suspected) exposure to covid-19: Secondary | ICD-10-CM | POA: Diagnosis not present

## 2021-07-30 DIAGNOSIS — Z20822 Contact with and (suspected) exposure to covid-19: Secondary | ICD-10-CM | POA: Diagnosis not present

## 2021-08-09 DIAGNOSIS — Z20822 Contact with and (suspected) exposure to covid-19: Secondary | ICD-10-CM | POA: Diagnosis not present

## 2021-08-11 DIAGNOSIS — Z20822 Contact with and (suspected) exposure to covid-19: Secondary | ICD-10-CM | POA: Diagnosis not present

## 2021-08-19 ENCOUNTER — Other Ambulatory Visit: Payer: Self-pay | Admitting: Neurology

## 2021-08-20 DIAGNOSIS — G245 Blepharospasm: Secondary | ICD-10-CM | POA: Diagnosis not present

## 2021-08-20 DIAGNOSIS — K117 Disturbances of salivary secretion: Secondary | ICD-10-CM | POA: Diagnosis not present

## 2021-08-27 DIAGNOSIS — I70203 Unspecified atherosclerosis of native arteries of extremities, bilateral legs: Secondary | ICD-10-CM | POA: Diagnosis not present

## 2021-08-27 DIAGNOSIS — M205X1 Other deformities of toe(s) (acquired), right foot: Secondary | ICD-10-CM | POA: Diagnosis not present

## 2021-08-27 DIAGNOSIS — R2681 Unsteadiness on feet: Secondary | ICD-10-CM | POA: Diagnosis not present

## 2021-08-27 DIAGNOSIS — M6281 Muscle weakness (generalized): Secondary | ICD-10-CM | POA: Diagnosis not present

## 2021-08-27 DIAGNOSIS — R262 Difficulty in walking, not elsewhere classified: Secondary | ICD-10-CM | POA: Diagnosis not present

## 2021-08-27 DIAGNOSIS — M205X2 Other deformities of toe(s) (acquired), left foot: Secondary | ICD-10-CM | POA: Diagnosis not present

## 2021-08-27 DIAGNOSIS — B351 Tinea unguium: Secondary | ICD-10-CM | POA: Diagnosis not present

## 2021-09-03 DIAGNOSIS — E785 Hyperlipidemia, unspecified: Secondary | ICD-10-CM | POA: Diagnosis not present

## 2021-09-03 DIAGNOSIS — M8580 Other specified disorders of bone density and structure, unspecified site: Secondary | ICD-10-CM | POA: Diagnosis not present

## 2021-09-22 DIAGNOSIS — K117 Disturbances of salivary secretion: Secondary | ICD-10-CM | POA: Diagnosis not present

## 2021-09-22 DIAGNOSIS — R296 Repeated falls: Secondary | ICD-10-CM | POA: Diagnosis not present

## 2021-09-22 DIAGNOSIS — Z79899 Other long term (current) drug therapy: Secondary | ICD-10-CM | POA: Diagnosis not present

## 2021-09-22 DIAGNOSIS — G4752 REM sleep behavior disorder: Secondary | ICD-10-CM | POA: Diagnosis not present

## 2021-09-22 DIAGNOSIS — G249 Dystonia, unspecified: Secondary | ICD-10-CM | POA: Diagnosis not present

## 2021-09-22 DIAGNOSIS — T48295A Adverse effect of other drugs acting on muscles, initial encounter: Secondary | ICD-10-CM | POA: Diagnosis not present

## 2021-09-22 DIAGNOSIS — G20 Parkinson's disease: Secondary | ICD-10-CM | POA: Diagnosis not present

## 2021-09-22 DIAGNOSIS — Z9682 Presence of neurostimulator: Secondary | ICD-10-CM | POA: Diagnosis not present

## 2021-09-26 ENCOUNTER — Other Ambulatory Visit: Payer: Self-pay | Admitting: Neurology

## 2021-09-26 DIAGNOSIS — G4752 REM sleep behavior disorder: Secondary | ICD-10-CM

## 2021-09-26 DIAGNOSIS — G479 Sleep disorder, unspecified: Secondary | ICD-10-CM

## 2021-09-27 ENCOUNTER — Other Ambulatory Visit: Payer: Self-pay

## 2021-09-27 ENCOUNTER — Emergency Department (HOSPITAL_BASED_OUTPATIENT_CLINIC_OR_DEPARTMENT_OTHER): Payer: Medicare Other | Admitting: Radiology

## 2021-09-27 ENCOUNTER — Encounter (HOSPITAL_BASED_OUTPATIENT_CLINIC_OR_DEPARTMENT_OTHER): Payer: Self-pay | Admitting: Emergency Medicine

## 2021-09-27 ENCOUNTER — Emergency Department (HOSPITAL_BASED_OUTPATIENT_CLINIC_OR_DEPARTMENT_OTHER)
Admission: EM | Admit: 2021-09-27 | Discharge: 2021-09-27 | Disposition: A | Discharge status: Home / Self Care | Payer: Medicare Other | Attending: Emergency Medicine | Admitting: Emergency Medicine

## 2021-09-27 DIAGNOSIS — Y9301 Activity, walking, marching and hiking: Secondary | ICD-10-CM | POA: Insufficient documentation

## 2021-09-27 DIAGNOSIS — W25XXXA Contact with sharp glass, initial encounter: Secondary | ICD-10-CM | POA: Diagnosis not present

## 2021-09-27 DIAGNOSIS — S8992XA Unspecified injury of left lower leg, initial encounter: Secondary | ICD-10-CM | POA: Diagnosis not present

## 2021-09-27 DIAGNOSIS — S81812A Laceration without foreign body, left lower leg, initial encounter: Secondary | ICD-10-CM | POA: Diagnosis not present

## 2021-09-27 DIAGNOSIS — G20 Parkinson's disease: Secondary | ICD-10-CM | POA: Insufficient documentation

## 2021-09-27 MED ORDER — FENTANYL CITRATE PF 50 MCG/ML IJ SOSY
50.0000 ug | PREFILLED_SYRINGE | Freq: Once | INTRAMUSCULAR | Status: AC
  Administered 2021-09-27: 50 ug via INTRAMUSCULAR
  Filled 2021-09-27: qty 1

## 2021-09-27 MED ORDER — LIDOCAINE HCL (PF) 1 % IJ SOLN
INTRAMUSCULAR | Status: AC
  Filled 2021-09-27: qty 5

## 2021-09-27 MED ORDER — LIDOCAINE HCL (PF) 1 % IJ SOLN
10.0000 mL | Freq: Once | INTRAMUSCULAR | Status: AC
  Administered 2021-09-27: 10 mL via INTRADERMAL
  Filled 2021-09-27: qty 10

## 2021-09-27 NOTE — ED Provider Notes (Signed)
[place] EMERGENCY DEPT Provider Note   CSN: [id number] Arrival date & time: 09/27/21  1456     History Chief Complaint  Patient presents with   Laceration    Left lower leg    Ronald Noble is a 76 y.o. male with history of Parkinson's presents the emergency department for evaluation of left lower leg laceration.  Patient reports that he was shopping for shoes with his wife when he walked by a display and cut his leg.  Denies any falls or any other injury.  Wife reports that the patient is up-to-date on his tetanus.  Allergic to rosuvastatin.   Laceration      Home Medications Prior to Admission medications   Medication Sig Start Date End Date Taking? Authorizing Provider  acetaminophen (TYLENOL) 325 MG tablet Take 2 tablets (650 mg total) by mouth every 6 (six) hours as needed. 04/01/21   Jeanell Sparrow, DO  amantadine (SYMMETREL) 100 MG capsule Take 1 capsule (100 mg total) by mouth 2 (two) times daily. 04/10/21   Star Age, MD  atorvastatin (LIPITOR) 10 MG tablet Take 1 tablet (10 mg total) by mouth daily. 10/10/20   Larey Dresser, MD  carbidopa-levodopa (SINEMET IR) 25-100 MG tablet TAKE 1 AND 1/2 TABLETS BY MOUTH 5 TIMES DAILY. 08/20/21   Star Age, MD  cholecalciferol (VITAMIN D3) 25 MCG (1000 UNIT) tablet Take 12,000 Units by mouth daily.    [provider]  clonazePAM (KLONOPIN) 0.25 MG disintegrating tablet DISSOLVE 2 TABLETS(0.5 MG) ON THE TONGUE AT BEDTIME 02/19/21   Star Age, MD  Coenzyme Q10 (COQ10) 200 MG CAPS Take 200 mg by mouth daily. 08/01/20   Larey Dresser, MD  ibandronate (BONIVA) 150 MG tablet Take 150 mg by mouth every 30 (thirty) days. Take in the morning with a full glass of water, on an empty stomach, and do not take anything else by mouth or lie down for the next 30 min.    [provider]  Melatonin 5 MG SUBL Place under the tongue.    [provider]  NEXLETOL 180 MG TABS Take 1 tablet by  mouth daily. 07/05/21   [provider]  Ropinirole HCl 6 MG TB24 Take 1 tablet (6 mg total) by mouth daily. 03/17/21   Star Age, MD  sildenafil (REVATIO) 20 MG tablet Take 20 mg by mouth as needed.    [provider]      Allergies    Rosuvastatin    Review of Systems   Review of Systems  Skin:  Positive for wound.    Physical Exam Updated Vital Signs BP (!) 145/74   Pulse 64   Temp 98.7 F (37.1 C)   Resp 20   Ht '5\' 11"'$  (1.803 m)   Wt 74.8 kg   SpO2 98%   BMI 23.01 kg/m  Physical Exam Vitals and nursing note reviewed.  Constitutional:      General: He is not in acute distress.    Appearance: He is not toxic-appearing.  Eyes:     General: No scleral icterus. Pulmonary:     Effort: Pulmonary effort is normal. No respiratory distress.  Musculoskeletal:     Comments: Compartments soft. Pulses intact.   Skin:    General: Skin is dry.     Findings: No rash.     Comments: Approximately 5cm laceration noted to the lower left lateral leg. Bleeding controlled. Sensation intact. See sutured image  Neurological:     Mental  Status: He is alert. Mental status is at baseline.  Psychiatric:        Mood and Affect: Mood normal.      ED Results / Procedures / Treatments   Labs (all labs ordered are listed, but only abnormal results are displayed) Labs Reviewed - No data to display  EKG None  Radiology DG Tibia/Fibula Left  Result Date: 09/27/2021 CLINICAL DATA:  Laceration with glass. EXAM: LEFT TIBIA AND FIBULA - 2 VIEW COMPARISON:  None Available. FINDINGS: Negative for fracture Bandage overlying the lateral distal fibula. No foreign body identified. IMPRESSION: Negative for fracture or foreign body. Electronically Signed   By: Franchot Gallo M.D.   On: 09/27/2021 16:56    Procedures Procedures   Medications Ordered in ED Medications  fentaNYL (SUBLIMAZE) injection 50 mcg (has no administration in time range)  lidocaine (PF) (XYLOCAINE) 1 %  injection 10 mL (has no administration in time range)    ED Course/ Medical Decision Making/ A&P                           Medical Decision Making Amount and/or Complexity of Data Reviewed Radiology: ordered.  Risk Prescription drug management.   76 year old male presents the emergency department for evaluation of laceration to his left lower leg.  X-ray obtained rule out any foreign body or fraction.  No foreign body or fraction read on x-ray.  Please see procedure note for additional information.  Patient does not need a tetanus as his tetanus is up-to-date per patient's wife and patient.  Fentanyl 50 mcg was given preprocedurally.  Wound does not appear visibly contaminated.  I do not see a need for antibiotics at this time.  We will discuss wound care and strict return precautions.  Recommend the patient return in 10 to 14 days for suture removal.  In the meantime, advised stay out of standing water to prevent any infection.  Patient and wife verbalized understanding and agreed to plan.  Patient is stable being discharged home in good condition.  Final Clinical Impression(s) / ED Diagnoses Final diagnoses:  Leg laceration, left, initial encounter    Rx / DC Orders ED Discharge Orders     None         Sherrell Puller, Hershal Coria 09/27/21 1909    Margette Fast, MD 10/01/21 1012

## 2021-09-30 DIAGNOSIS — R262 Difficulty in walking, not elsewhere classified: Secondary | ICD-10-CM | POA: Diagnosis not present

## 2021-09-30 DIAGNOSIS — M205X2 Other deformities of toe(s) (acquired), left foot: Secondary | ICD-10-CM | POA: Diagnosis not present

## 2021-09-30 DIAGNOSIS — R2681 Unsteadiness on feet: Secondary | ICD-10-CM | POA: Diagnosis not present

## 2021-09-30 DIAGNOSIS — M205X1 Other deformities of toe(s) (acquired), right foot: Secondary | ICD-10-CM | POA: Diagnosis not present

## 2021-09-30 DIAGNOSIS — M6281 Muscle weakness (generalized): Secondary | ICD-10-CM | POA: Diagnosis not present

## 2021-10-01 ENCOUNTER — Telehealth: Payer: Self-pay | Admitting: Physical Therapy

## 2021-10-01 DIAGNOSIS — R293 Abnormal posture: Secondary | ICD-10-CM

## 2021-10-01 DIAGNOSIS — R278 Other lack of coordination: Secondary | ICD-10-CM

## 2021-10-01 DIAGNOSIS — R471 Dysarthria and anarthria: Secondary | ICD-10-CM

## 2021-10-01 DIAGNOSIS — R498 Other voice and resonance disorders: Secondary | ICD-10-CM

## 2021-10-01 DIAGNOSIS — G20 Parkinson's disease: Secondary | ICD-10-CM

## 2021-10-01 DIAGNOSIS — R2681 Unsteadiness on feet: Secondary | ICD-10-CM

## 2021-10-01 NOTE — Telephone Encounter (Signed)
Refilled last 09-22-2021 with Elbert Ewings PA from [facility] for clonazepam 0.'5mg'$  one tab nightly with 2 refills.

## 2021-10-01 NOTE — Telephone Encounter (Signed)
Dr. Rexene Alberts,  Ronald Noble is scheduled for  OT, PT, and ST re-evaluations on 10/20/21 as recommended when patient was last discharged from therapy due to progressive nature of diagnosis.  Pt was in agreement with this plan.  If you are in agreement, please send updated order for OT, PT, and ST via epic.  Thank you, Janann August, PT, DPT 10/01/21 1:00 PM    [place] [address] [place], [place]  [address] Phone:  [phone] Fax:  [phone]

## 2021-10-02 NOTE — Telephone Encounter (Signed)
Referral to neuro rehab placed for re-evaluations planned for mid July.

## 2021-10-08 DIAGNOSIS — S81812A Laceration without foreign body, left lower leg, initial encounter: Secondary | ICD-10-CM | POA: Diagnosis not present

## 2021-10-08 DIAGNOSIS — Z4802 Encounter for removal of sutures: Secondary | ICD-10-CM | POA: Diagnosis not present

## 2021-10-13 ENCOUNTER — Other Ambulatory Visit: Payer: Self-pay | Admitting: *Deleted

## 2021-10-13 NOTE — Telephone Encounter (Signed)
Received prescription refill request from [place] on [facility] for Carbidopa-Levodopa 25-100 mg 1 tablet 7 times daily (8 AM, 10 AM, 12 PM, 2 PM, 4 PM, 6 PM, 8 PM). However the last office note with Dr Rexene Alberts stated pt was taking 1.5 tablets five times daily. I called the pt and LVM (Ok per [facility]) asking for call back to discuss.

## 2021-10-14 ENCOUNTER — Telehealth: Payer: Self-pay | Admitting: Neurology

## 2021-10-14 NOTE — Telephone Encounter (Signed)
I called the pt back & LVM asking for call back. I did not see a recent change in this medication. I had a question for him regarding his Sinemet IR actually.

## 2021-10-14 NOTE — Telephone Encounter (Signed)
Pt said want to let the nurse know since the change in clonazePAM (KLONOPIN) 0.25 MG disintegrating tablet. Everything is ok.

## 2021-10-15 NOTE — Telephone Encounter (Signed)
I called the pt back & LVM asking for call back.
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
## 2021-10-20 ENCOUNTER — Ambulatory Visit: Payer: Medicare Other | Admitting: Occupational Therapy

## 2021-10-20 ENCOUNTER — Ambulatory Visit: Payer: Medicare Other | Attending: Orthopaedic Surgery | Admitting: Physical Therapy

## 2021-10-20 ENCOUNTER — Encounter: Payer: Self-pay | Admitting: Physical Therapy

## 2021-10-20 ENCOUNTER — Ambulatory Visit: Payer: Medicare Other

## 2021-10-20 ENCOUNTER — Encounter: Payer: Self-pay | Admitting: Occupational Therapy

## 2021-10-20 DIAGNOSIS — Z9181 History of falling: Secondary | ICD-10-CM

## 2021-10-20 DIAGNOSIS — R471 Dysarthria and anarthria: Secondary | ICD-10-CM | POA: Diagnosis not present

## 2021-10-20 DIAGNOSIS — R41844 Frontal lobe and executive function deficit: Secondary | ICD-10-CM

## 2021-10-20 DIAGNOSIS — R29898 Other symptoms and signs involving the musculoskeletal system: Secondary | ICD-10-CM | POA: Diagnosis not present

## 2021-10-20 DIAGNOSIS — R2681 Unsteadiness on feet: Secondary | ICD-10-CM | POA: Diagnosis not present

## 2021-10-20 DIAGNOSIS — R293 Abnormal posture: Secondary | ICD-10-CM | POA: Diagnosis not present

## 2021-10-20 DIAGNOSIS — M25611 Stiffness of right shoulder, not elsewhere classified: Secondary | ICD-10-CM | POA: Insufficient documentation

## 2021-10-20 DIAGNOSIS — M6281 Muscle weakness (generalized): Secondary | ICD-10-CM | POA: Diagnosis not present

## 2021-10-20 DIAGNOSIS — R278 Other lack of coordination: Secondary | ICD-10-CM | POA: Insufficient documentation

## 2021-10-20 DIAGNOSIS — R41841 Cognitive communication deficit: Secondary | ICD-10-CM | POA: Diagnosis not present

## 2021-10-20 DIAGNOSIS — R29818 Other symptoms and signs involving the nervous system: Secondary | ICD-10-CM | POA: Diagnosis not present

## 2021-10-20 DIAGNOSIS — R2689 Other abnormalities of gait and mobility: Secondary | ICD-10-CM | POA: Diagnosis not present

## 2021-10-20 DIAGNOSIS — R41840 Attention and concentration deficit: Secondary | ICD-10-CM | POA: Diagnosis not present

## 2021-10-20 NOTE — Patient Instructions (Signed)
SPEAK OUT! is a structured program targeting voice in patient's with Parkinson's. This program was developed by [employer]. It is evidence based and based on principles of motor learning. The nonprofit provides free materials to patients being treated by a Speak Out! certified SLP.   www.parkinsonvoiceproject.org for more information  Learn about Parkinson's webinar: [employer]cz -- highly recommend watching this webinar!!  Order your stimulus booklet: [phone] Your provider: Leta Speller SLP, [place]  This book is free!! They do offer a "pay if forward" option where you can donate to the non-profit organization so they can continue serving the Parkinson's population and providing these valuable resources to patients.

## 2021-10-20 NOTE — Therapy (Signed)
OUTPATIENT PHYSICAL THERAPY NEURO EVALUATION   Patient Name: Ronald Noble MRN: [id number] DOB:[birth year], 76 y.o., male Today's Date: 10/20/2021   PCP: [name], MD REFERRING PROVIDER: Star Age, MD   PT End of Session - 10/20/21 1225     Visit Number 1    Number of Visits 17    Date for PT Re-Evaluation 01/18/22   Due to delay in scheduling   Authorization Type Medicare & BCBS    PT Start Time 1018    PT Stop Time 1100    PT Time Calculation (min) 42 min    Equipment Utilized During Treatment Gait belt    Activity Tolerance Patient tolerated treatment well    Behavior During Therapy [facility] for tasks assessed/performed;Impulsive             Past Medical History:  Diagnosis Date   BPH (benign prostatic hyperplasia)    COVID 04/24/2020   scratchy throat x 1 day all symptoms resolved   Parkinson disease ([place])    Skin cancer of face    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   Sleep apnea    mild no cpap used   Urethral stricture    Past Surgical History:  Procedure Laterality Date   BURR HOLE W/ STEREOTACTIC INSERTION OF DBS LEADS / INTRAOP MICROELECTRODE RECORDING  04/2014   [facility] in [facility]   COLONOSCOPY  2019   [place] N/A 06/15/2019   Procedure: CYSTOSCOPY WITH URETHRAL DILATATION;  Surgeon: Franchot Gallo, MD;  Location: [facility];  Service: Urology;  Laterality: N/A;  [place] N/A 05/09/2020   Procedure: CYSTOSCOPY WITH BALLOON DILATATION OF POSTERIOR URETHRAL STRICTURE, FLUOROSCOPIC INTERPRETATION;  Surgeon: Franchot Gallo, MD;  Location: [facility];  Service: Urology;  Laterality: N/A;   KNEE SURGERY  yrs ago   exploratory/left   SKIN CANCER EXCISION  01/2014   face   TRANSURETHRAL RESECTION OF PROSTATE     2008   Patient Active Problem List   Diagnosis Date Noted   Rib pain on right side 12/10/2020    Hyperlipidemia 10/11/2020   Bilateral olecranon bursitis 07/05/2019   Other bursitis of knee, left knee 12/06/2018   Pain in right leg 12/06/2018   Parkinson disease ([place])     ONSET DATE: 10/02/2021 (date of referral)  REFERRING DIAG: G20 (ICD-10-CM) - Parkinson's disease ([place])  THERAPY DIAG:  Abnormal posture  Unsteadiness on feet  Other abnormalities of gait and mobility  Other symptoms and signs involving the nervous system  Muscle weakness (generalized)  History of falling  Rationale for Evaluation and Treatment Rehabilitation  SUBJECTIVE:  SUBJECTIVE STATEMENT: Doing RSB at [employer] 2 times a week. Feels like it is helping. 3x a week is doing his stretching/yoga exercises on the floor and some new exercises he has learned in boxing. Trying to walk 3 times a week outside - uses his Nordic walking sticks. Qualified for a U-Step Walker, but has not gone through with it. Uses his cane at night for initial stability when he gest out of bed, otherwise does not use it very much and uses it as a security blanket. Retired recently from being an Forensic psychologist. Problem is still going too fast and accelerating more than he wants to. Having dyskinesias during eval - happens after he takes his meds (took them at 10 AM), mainly facial dyskinesias when he has to close his eyes and lasts for 15-30 minutes.    Pt accompanied by: self  PERTINENT HISTORY: PD 2008, bilateral DBS (2018); hyperlipidemia   Pt with facial dystonias/dyskinesias with his medications that make it difficult to communicate and keep his eyes open (happens approx. 45 minutes after taking meds)  PAIN:  Are you having pain? No  PRECAUTIONS: Fall Bilateral DBS, No driving, freezing/festination episodes  FALLS: Has patient fallen in last  6 months? Yes. Number of falls Yes. Number of falls 2 with ED visits and stumbles/multiple other falls  (1-2x/month)  LIVING ENVIRONMENT: Lives with: lives with their spouse Lives in: House/apartment Stairs: Yes: Internal: 2 steps; can reach both and External: 3 and 2 steps; can reach both Has following equipment at home: Single point cane, shower chair, and Walk in shower  PLOF: Independent with basic ADLs, Independent with household mobility without device, and Independent with community mobility with device Independent with basic ADLs, Vocation/Vocational requirements: retired in March 2023, and Leisure: [employer] 2x/wk, stretching/yoga (and therapy exercises), walk (range is limited approx 2 blocks--uses hiking poles)  PATIENT GOALS Wants to work on slowing down.   OBJECTIVE:   COGNITION: Overall cognitive status: Impaired   MUSCLE TONE: RLE and LLE: Rigidity   POSTURE: rounded shoulders, forward head, flexed trunk , and weight shift right  LOWER EXTREMITY ROM:    Decreased knee extension AROM on LLE   LOWER EXTREMITY MMT:    MMT Right Eval Left Eval  Hip flexion 4+ 5  Hip extension    Hip abduction    Hip adduction    Hip internal rotation    Hip external rotation    Knee flexion 4+ 4+  Knee extension 4+ 4+  Ankle dorsiflexion 5 5  Ankle plantarflexion    Ankle inversion    Ankle eversion    (Blank rows = not tested)   TRANSFERS: Assistive device utilized: None  Sit to stand: SBA Stand to sit: SBA  Pt performs with arms crossed, decr forward lean. BLE bracing at times against chair.   Has trouble at home getting up from his couch or other soft surfaces.    GAIT: Gait pattern: step through pattern, decreased arm swing- Right, decreased arm swing- Left, decreased stride length, lateral lean- Right, decreased trunk rotation, trunk flexed, and narrow BOS Distance walked: Clinic distances.  Assistive device utilized: Single point cane Level of assistance:  CGA and Min A Comments: Pt with eyes closed the majority of the session due to dyskinesias after taking his meds at 10 AM. PT needing to provide close CGA/min A during session to avoid bumping into objects and the wall.   Pt reports incr festination episodes (esp as the day goes on) where  his body goes forward and he is on his toes. Reports he does better with these if he focuses on tall posture first.   Pt ambulates with SPC at night and carries it when he goes out (but doesn't use)  FUNCTIONAL TESTs:  5 times sit to stand: 11.28 seconds, 3 reps of retropulsion and BLE bracing against chair 10 meter walk test: 11.59 seconds = 2.83 ft/sec with SPC (pt holding cane majority of the time)    [facility] PT Assessment - 10/20/21 1041       Mini-BESTest   Sit To Stand Normal: Comes to stand without use of hands and stabilizes independently.    Rise to Toes Moderate: Heels up, but not full range (smaller than when holding hands), OR noticeable instability for 3 s.    Stand on one leg (left) Moderate: < 20 s    Stand on one leg (right) Severe: Unable    Stand on one leg - lowest score 0    Stance - Feet together, eyes open, firm surface  Normal: 30s      Timed Up and Go Test   Normal TUG (seconds) 10.6    Manual TUG (seconds) 12.22    Cognitive TUG (seconds) 13.54    TUG Comments Pt needing CGA when turning, min A during cog TUG            During miniBEST when PT was guarding pt during SLS, PT noticed blood on her forearm. Noted pt had an open cut on his elbow. PT took time to clean her forearm and also sterilize pt's cut and give him a bandaid. Pt did not realize that he had cut his elbow.    TODAY'S TREATMENT:  N/A during eval.    PATIENT EDUCATION: Education details: Clinical findings, POC, Discussed purpose of U-Step Rollator and will practice with it during future sessions (pt has an order from physician, but has not gone through with it as he does not want to use it) Person  educated: Patient Education method: Explanation Education comprehension: verbalized understanding   HOME EXERCISE PROGRAM: From previous bout of therapy: Seated and standing PWR Up, Quadruped PWR moves on floor, [id number]    GOALS: Goals reviewed with patient? Yes  SHORT TERM GOALS: Target date: 11/18/2021  MiniBEST to finish being assessed with LTG written. Baseline: Goal status: INITIAL  2.  Pt will perform at least 8 of 10 reps, minimal to no UE support, with NO retropulsion or posterior lean for imrpoved trasnfer safety and efficiency.  Baseline:  Goal status: INITIAL   LONG TERM GOALS: Target date: 12/16/2021  Pt will be independent with final HEP for improved strength, balance, transfers, and gait.   Baseline:  Goal status: INITIAL  2.  Patient will verbalize fall prevention strategies in the home, including tips to help with festination episodes in order to decrease risk of future falls.  Baseline:  Goal status: INITIAL  3. miniBEST goal to be written as appropriate in order to demo decr fall risk.  Baseline:  Goal status: INITIAL  4.  Pt will demonstrate appropriate use of LRAD for indoor and household activities, to assist with gait safety, at least 200 ft with supervision.  Baseline:  Goal status: INITIAL  5.  Pt will perform at least 10 reps with minimal to no UE support from lower/soft surfaces with supervision and no instances of retropulsion in order to demo improvement getting up from the couch. Baseline:  Goal status: INITIAL   ASSESSMENT:  CLINICAL IMPRESSION: Patient is a 76 year old male referred to Neuro OPPT for return 6 month eval for PD and is well known to this clinic.   Pt's PMH is significant for: PD 2008, bilateral DBS (2018); hyperlipidemia, dystonia/dyskinesia. The following deficits were present during the exam: abnormal posture, bradykinesia, rigidity, impaired balance, decr timing/coordination of gait, gait abnormalities, impaired safety  awareness, postural instability. Pt took his medication right before his PT session today and had incr facial dyskinesias/dystonias that caused him to close his eyes throughout session needing min A at times for gait and safety. MD is aware of this, per pt, and they have not been able to figure it out with his medications yet. Pt has had multiple falls in the past few months with 2 ED visits. Pt would benefit from skilled PT to address these impairments and functional limitations to maximize functional mobility independence    OBJECTIVE IMPAIRMENTS Abnormal gait, decreased activity tolerance, decreased balance, decreased coordination, decreased knowledge of use of DME, difficulty walking, decreased ROM, decreased strength, decreased safety awareness, impaired flexibility, impaired tone, and postural dysfunction.   ACTIVITY LIMITATIONS bending, stairs, transfers, bed mobility, and locomotion level  PARTICIPATION LIMITATIONS: cleaning, driving, shopping, community activity, and occupation - pt retired in March  PERSONAL FACTORS Behavior pattern, Past/current experiences, Time since onset of injury/illness/exacerbation, and 3+ comorbidities: PD 2008, bilateral DBS (2018); hyperlipidemia, dystonia/dyskinesias  are also affecting patient's functional outcome.   REHAB POTENTIAL: Good  CLINICAL DECISION MAKING: Evolving/moderate complexity  EVALUATION COMPLEXITY: Moderate  PLAN: PT FREQUENCY: 2x/week  PT DURATION: 12 weeks  PLANNED INTERVENTIONS: Therapeutic exercises, Therapeutic activity, Neuromuscular re-education, Balance training, Gait training, Patient/Family education, Self Care, Stair training, and DME instructions  PLAN FOR NEXT SESSION: Finish miniBEST and write LTG. Review previous HEP and update as needed for posture/balance (esp PWR Up! In sitting/standing) and sand other aspects of miniBEST. Work on sit <> stands with incr forward lean. Practice with U-Step Rollator (pt has an order  for one, but does not want to use it or go through with it) or determine with AD would be best for pt. Last time he was here, used a metronome with SPC to help slow down his gait which worked well in the past, but dont think pt ever got a clip on metronome to use for home.    Arliss Journey, PT, DPT 10/20/2021, 12:26 PM

## 2021-10-20 NOTE — Therapy (Signed)
OUTPATIENT SPEECH LANGUAGE PATHOLOGY PARKINSON'S EVALUATION   Patient Name: Ronald Noble MRN: [id number] DOB:[birth year], 76 y.o., male Today's Date: 10/20/2021  PCP: [name], MD REFERRING PROVIDER: Star Age, MD    End of Session - 10/20/21 0915     Visit Number 1    Number of Visits 17    Date for SLP Re-Evaluation 12/19/21    Authorization Type Medicare/BCBS    SLP Start Time 0935    SLP Stop Time  8938    SLP Time Calculation (min) 40 min    Activity Tolerance Patient tolerated treatment well             Past Medical History:  Diagnosis Date   BPH (benign prostatic hyperplasia)    COVID 04/24/2020   scratchy throat x 1 day all symptoms resolved   Parkinson disease ([place])    Skin cancer of face    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   Sleep apnea    mild no cpap used   Urethral stricture    Past Surgical History:  Procedure Laterality Date   BURR HOLE W/ STEREOTACTIC INSERTION OF DBS LEADS / INTRAOP MICROELECTRODE RECORDING  04/2014   [facility] in [facility]   COLONOSCOPY  2019   CYSTOSCOPY WITH URETHRAL DILATATION N/A 06/15/2019   Procedure: CYSTOSCOPY WITH URETHRAL DILATATION;  Surgeon: Franchot Gallo, MD;  Location: [facility];  Service: Urology;  Laterality: N/A;  [place] N/A 05/09/2020   Procedure: CYSTOSCOPY WITH BALLOON DILATATION OF POSTERIOR URETHRAL STRICTURE, FLUOROSCOPIC INTERPRETATION;  Surgeon: Franchot Gallo, MD;  Location: [facility];  Service: Urology;  Laterality: N/A;   KNEE SURGERY  yrs ago   exploratory/left   SKIN CANCER EXCISION  01/2014   face   TRANSURETHRAL RESECTION OF PROSTATE     2008   Patient Active Problem List   Diagnosis Date Noted   Rib pain on right side 12/10/2020   Hyperlipidemia 10/11/2020   Bilateral olecranon bursitis 07/05/2019   Other bursitis of knee, left knee 12/06/2018   Pain in right leg  12/06/2018   Parkinson disease ([place])     ONSET DATE: PD dx 2008; 10/02/2021 (referral)  REFERRING DIAG: (ICD-10-CM) - Dysarthria and anarthria R26.81   THERAPY DIAG: Dysarthria and anarthria  Cognitive communication deficit  Rationale for Evaluation and Treatment Rehabilitation  SUBJECTIVE:   SUBJECTIVE STATEMENT: "My volume is lower than it used to be. My articulation is not as good as it used to be. I'm clearing my throat more frequently." Pt accompanied by: self  PERTINENT HISTORY: Pt had DBS placed March 2012. Dx of PD in 2008.   PAIN:  Are you having pain? No  FALLS: Has patient fallen in last 6 months?  Yes, See PT evaluation for details  LIVING ENVIRONMENT: Lives with: lives with their spouse Lives in: House/apartment  PLOF:  Level of assistance: Independent with ADLs, Independent with IADLs (now requires increased assistance)  Employment: Retired (no longer working or driving)   PATIENT GOALS to be better understood  OBJECTIVE:   COGNITION: Overall cognitive status: Impaired Areas of impairment: Attention, Memory, Safety/judgement, Awareness, and Problem solving Comments: Hx of divided attention difficulty per chart review of prior ST notes; varied compliance with medication and HEP in past  Today, pt reports difficulty recalling when to take medication (gets distracted but also reports he intentionally delays due to upcoming social interactions and dyskinsia). Some difficulty with topic maintenance  and recall in conversation reported.   MOTOR SPEECH: Overall motor speech: impaired Level of impairment: Conversation Respiration: thoracic breathing and clavicular breathing Phonation: low vocal intensity Resonance: WFL Articulation: Impaired: conversation Intelligibility: Intelligibility reduced (especially when rushes in speech occur) Motor speech errors: inconsistent Interfering components:  PD Effective technique: slow rate, increased vocal intensity,  over articulate, and pause  ORAL MOTOR EXAMINATION Overall status: Impaired:   Labial: Bilateral (ROM and Strength) Comments: Reduced bilateral labial seal for pucker and breath hold   OBJECTIVE VOICE ASSESSMENT: Sustained "ah" maximum phonation time: 10-12 seconds Sustained "ah" loudness average: 84 dB (varied pitch and loudness initially as that is what he recalled from last ST course) Oral reading (passage) loudness average: 74 dB Oral reading loudness range: 70-76 dB Conversational loudness average: upper 60s (~68) dB Conversational loudness range: 65-72 dB Voice quality: low vocal intensity Stimulability trials: Given SLP modeling and occasional min cues, loudness average increased to 91 dB for loud "ah" level.  Comments: Rare rushes in speech exhibited today (could be medication related) and varied volume in conversation (mid 60s to low 70s dB)   Completed audio recording of patients baseline voice without cueing from SLP: No   Pt does not report difficulty with swallowing which does not warrant further evaluation. Some dry mouth indicated, in which pt attempting to increase hydration and use mouth spray to reduce dryness.   Pt is experiencing some changes in cognition, including wife having to repeat herself more frequently despite having already discussed topics. Pt endorsed could be related to be short term recall and/or attention. Also indicated some difficulty recalling and attending when to take medication (pt did endorse he will intentionally choose not to take medications at times). Denied difficulty managing appointments (wife assisting) and finances.   PATIENT REPORTED OUTCOME MEASURES (PROM): Cognitive Function:  76 "A lot of difficulty" = getting organized, remembering a list without writing it down, making simple mistakes, doing more than one than at a time, trouble remembering things I was supposed to do, walked into room and forgot, trouble thinking clearly, reacted  slowly, trouble concentrating  "Somewhat difficult" = managing time, remember where things were place, words on tip of tongue, trouble keeping track of what I was doing, trouble forming thoughts, thinking was slow, working really hard to pay attention, planning out steps of task    Communication Participation Item Bank: 5 "Very much difficulty" = talking with people you know, communicating quickly, talking with people you don't know, communicating in small group, getting detailed information, getting turn in fast moving conversation "Quite a bit of difficulty" = communicating in community, having long conversation, trying to persuade someone  "A little diffculty" = asking questions in conversation   TODAY'S TREATMENT:  10-20-21: Education provided on initiation of POC to address hypokinetic dysarthria and cognitive changes. Handout and instruction provided to order Speak Out! Workbook.    PATIENT EDUCATION: Education details: see above Person educated: Patient Education method: Explanation, Demonstration, and Handouts Education comprehension: verbalized understanding, returned demonstration, and needs further education   HOME EXERCISE PROGRAM: Speak Out!   GOALS: Goals reviewed with patient? Yes  SHORT TERM GOALS: Target date: 11/17/2021    Pt will complete Speak Out! HEP at least 1x/day given occasional min A over 2 sessions  Baseline: Goal status: INITIAL  2.  Pt will achieve targeted dB (85-90 dB) on warm up exercises with 80% accuracy given occasional min A over 2 sessions  Baseline:  Goal status: INITIAL  3.  Pt  will achieve targeted dB (75-85 dB) on reading exercises with 80% accuracy given occasional min A over 2 sessions Baseline:  Goal status: INITIAL  4.  Pt will achieve targeted dB (72-78 dB) on cognitive exercises with 80% accuracy given occasional min A over 2 sessions Baseline:  Goal status: INITIAL  5.  Pt will utilize dysarthria compensations in 5-10 minute  conversation to optimize vocal intensity and clarity given occasional min A over 2 sessions  Baseline:  Goal status: INITIAL  6.  Pt will utilize internal/external memory and attention aids to optimize medication management and completion of daily tasks given occasional min A over 2 sessions  Baseline:  Goal status: INITIAL  LONG TERM GOALS: Target date: 12/19/2021    Pt will complete Speak Out! HEP at least 1x/day (BID recommended) > 1 week  Baseline:  Goal status: INITIAL  2.  Pt will achieve targeted dB levels in demonstration of Speak Out! Lessons with 90% accuracy given rare min A over 2 sessions  Baseline:  Goal status: INITIAL  3.  Pt will utilize dysarthria compensations in 15+ minute conversation to optimize vocal intensity and clarity given rare min A over 2 sessions Baseline:  Goal status: INITIAL  4.  Pt will utilize internal/external memory and attention aids to optimize medication management and completion of daily tasks given rare min A over 2 sessions  Baseline:  Goal status: INITIAL  5.  Pt will report improved cognitive communication via PROM by 2 point improvements by last ST session  Baseline: CF=84; CPIB=5 Goal status: INITIAL    ASSESSMENT:  CLINICAL IMPRESSION: "[name]" is a 76 y.o. male who was seen today for repeat evaluation related to progressive Parkinson's Disease. Last course of ST treatment was March to May 2022. Today, pt presents with decline in conversational volume (averaged upper 60s dB). Pt reported awareness of volume decline and reduced articulatory precision since last course of ST, with more frequent repetition requested. Pt stated "my wife is asking me to repeat more." No overt rushes in speech exhibited today but SLP expects fluctuations per previous course of SLP intervention. Pt also endorsed some changes in memory and possibly attention with wife having to repeat conversation more frequently and some troubling attending to medication  times. Pt denied any overt changes in swallow function but does endorse some dry mouth. Intermittent throat clearing also observed. Given change in cognition and decline in speech intelligibility, skilled ST intervention is recommended to optimize communication effectiveness as well as increase functional independence and safety through use of cognitive compensatory training.   OBJECTIVE IMPAIRMENTS  Objective impairments include attention, memory, executive functioning, and dysarthria. These impairments are limiting patient from managing medications, household responsibilities, and effectively communicating at home and in community.Factors affecting potential to achieve goals and functional outcome are ability to learn/carryover information, cooperation/participation level, and medical prognosis. Patient will benefit from skilled SLP services to address above impairments and improve overall function.  REHAB POTENTIAL: Fair - hx of reduced HEP compliance   PLAN: SLP FREQUENCY: 2x/week  SLP DURATION: 8 weeks  PLANNED INTERVENTIONS: Cueing hierachy, Cognitive reorganization, Internal/external aids, Functional tasks, Multimodal communication approach, SLP instruction and feedback, Compensatory strategies, and Patient/family education    Marzetta Board, CCC-SLP 10/20/2021, 10:19 AM

## 2021-10-21 DIAGNOSIS — L089 Local infection of the skin and subcutaneous tissue, unspecified: Secondary | ICD-10-CM | POA: Diagnosis not present

## 2021-10-21 DIAGNOSIS — I83029 Varicose veins of left lower extremity with ulcer of unspecified site: Secondary | ICD-10-CM | POA: Diagnosis not present

## 2021-10-21 DIAGNOSIS — L97929 Non-pressure chronic ulcer of unspecified part of left lower leg with unspecified severity: Secondary | ICD-10-CM | POA: Diagnosis not present

## 2021-10-21 DIAGNOSIS — S81812S Laceration without foreign body, left lower leg, sequela: Secondary | ICD-10-CM | POA: Diagnosis not present

## 2021-10-23 NOTE — Therapy (Signed)
OUTPATIENT OCCUPATIONAL THERAPY NEURO EVALUATION  Patient Name: Ronald Noble MRN: [id number] DOB:1945-10-07, 76 y.o., male Today's Date: 10/23/2021  PCP: Dr. [name] REFERRING PROVIDER: Dr. Star Age     Past Medical History:  Diagnosis Date   BPH (benign prostatic hyperplasia)    COVID 04/24/2020   scratchy throat x 1 day all symptoms resolved   Parkinson disease ([place])    Skin cancer of face    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   Sleep apnea    mild no cpap used   Urethral stricture    Past Surgical History:  Procedure Laterality Date   BURR HOLE W/ STEREOTACTIC INSERTION OF DBS LEADS / INTRAOP MICROELECTRODE RECORDING  04/2014   [facility]   COLONOSCOPY  2019   [place] N/A 06/15/2019   Procedure: CYSTOSCOPY WITH URETHRAL DILATATION;  Surgeon: Franchot Gallo, MD;  Location: [facility];  Service: Urology;  Laterality: N/A;  [place] N/A 05/09/2020   Procedure: CYSTOSCOPY WITH BALLOON DILATATION OF POSTERIOR URETHRAL STRICTURE, FLUOROSCOPIC INTERPRETATION;  Surgeon: Franchot Gallo, MD;  Location: [facility];  Service: Urology;  Laterality: N/A;   KNEE SURGERY  yrs ago   exploratory/left   SKIN CANCER EXCISION  01/2014   face   TRANSURETHRAL RESECTION OF PROSTATE     2008   Patient Active Problem List   Diagnosis Date Noted   Rib pain on right side 12/10/2020   Hyperlipidemia 10/11/2020   Bilateral olecranon bursitis 07/05/2019   Other bursitis of knee, left knee 12/06/2018   Pain in right leg 12/06/2018   Parkinson disease ([place])     ONSET DATE: 10/02/21  (referral date, recommended from previous therapy d/c)  REFERRING DIAG: Parkinson's Disease   THERAPY DIAG:  No diagnosis found.  Rationale for Evaluation and Treatment Rehabilitation  SUBJECTIVE:   SUBJECTIVE STATEMENT: Pt reports that he stopped  working in March and stopped driving in February/March  Pt accompanied by: self  PERTINENT HISTORY: Parkinson's Disease s/p bilateral DBS  PMH:  hx of skin CA, sleep apnea, bilateral DBS   PRECAUTIONS: Fall and Other: Bilateral DBS  WEIGHT BEARING RESTRICTIONS No  PAIN:  Are you having pain? No  FALLS: Has patient fallen in last 6 months? Yes. Number of falls 2 with ED visits and stumbles/multiple other falls  (1-2x/month)  LIVING ENVIRONMENT: Lives with: lives with their spouse  PLOF: Independent with basic ADLs, Vocation/Vocational requirements: retired in March 2023, and Leisure: [employer] 2x/wk, stretching/yoga (and therapy exercises), walk (range is limited approx 2 blocks--uses hiking poles)  PATIENT GOALS improve coordination, ADLs  OBJECTIVE:    FUNCTIONAL OUTCOME MEASURES: Physical performance test: PPT#2: 8.84 secs,  PPT#4: 25.04 secs Functional reach: RUE   LUE     OBSERVATIONS: Pt reports facial dystonias associated with medications which make it difficult to communicate and difficult to keep eyes open.  Pt with hx of dystonia/dyskinesias depending on medication cycle.  Bradykinesia noted, impulsivity noted at times.   TODAY'S TREATMENT:  supine PWR! Basic 4, with PWR! step broken down, min v.c for amplitude and positioning. Closed chain shoulder flexion and chest press. Min v.c for elbow extension. Therapist checked PPT#2, #4, and 3 button/ unbutton and standing functional reach, see updates to goals.   PATIENT EDUCATION: Education details: supine PWR! Basic 4, with PWR! step broken down separately Person educated: Patient Education method: Explanation, demonstration, verbal cues Education comprehension:  verbalized understanding, returned demonstration.   HOME EXERCISE PROGRAM:   GOALS: Potential Goals reviewed with patient? Yes  SHORT TERM GOALS: Target date: 11/20/21  Pt will be independent be updated PD-specific HEP. Goal status:  INITIAL  2.  Pt will demo at least 135* R shoulder flex for functional reaching  Baseline:  125* Goal status: INITIAL  3.  Pt will write at least 4 sentences with good size/legibility. Goal status: INITIAL  4.  Pt will improve L hand coordination for ADLs as shown by improving time on 9-hole peg test by at least 5sec. Baseline:  47.09sec Goal status: INITIAL  5.  Pt will verbalize understanding of memory/cognitive compensation strategies. Goal status: INITIAL   LONG TERM GOALS: Target date: 01/19/22  Pt will be independent with strategies/AE for ADLs/IADLs.(Including strategy for donning/ doffing jacket) Goal status: INITIAL  2.  Pt will improve L hand coordination for ADLs as shown by improving time on 9-hole peg test by at least 8sec. Baseline: 47.09sec Goal status: INITIAL  3.  Pt will improve bilateral functional reaching/coordination as shown by improving score on box and blocks test by at 4 blocks with LUE. Baseline: L-44 blocks Goal status: INITIAL  4.  Pt will improve bilateral functional reaching/coordination as shown by improving score on box and blocks test by at 4 blocks with RUE. Baseline:  R-47 blocks Goal status: INITIAL  5.  Pt will increase bilateral standing functional reach to 10 inches or greater to minimize fall risk. Baseline: R 9.5 L 8.5 Goal status: INITIAL  6.  Pt will demonstrate improved ease with fastening buttons as evidenced by decreasing 3 button/ unbutton time to :49 secs or less.  Baseline: 53.46 Goal status: INITIAL     ASSESSMENT:  CLINICAL IMPRESSION: Pt is progressing towards goals. He demonstrates understanding of PWR! Basic 4 in supine following review, with min v.c for amplitude.  PERFORMANCE DEFICITS in functional skills including ADLs, IADLs, coordination, dexterity, tone, ROM, flexibility, FMC, GMC, mobility, balance, body mechanics, endurance, decreased knowledge of precautions, decreased knowledge of use of DME, and UE  functional use, cognitive skills including attention, memory, perception, and safety awareness, and psychosocial skills including environmental adaptation and habits.   IMPAIRMENTS are limiting patient from ADLs, IADLs, and leisure.   COMORBIDITIES may have co-morbidities  that affects occupational performance. Patient will benefit from skilled OT to address above impairments and improve overall function.  MODIFICATION OR ASSISTANCE TO COMPLETE EVALUATION: Min-Moderate modification of tasks or assist with assess necessary to complete an evaluation.  OT OCCUPATIONAL PROFILE AND HISTORY: Detailed assessment: Review of records and additional review of physical, cognitive, psychosocial history related to current functional performance.  CLINICAL DECISION MAKING: Moderate - several treatment options, min-mod task modification necessary  REHAB POTENTIAL: Good  EVALUATION COMPLEXITY: Moderate    PLAN: OT FREQUENCY: 2x/week  OT DURATION: 8 weeks +eval or 17 visits over 12 weeks for scheduling  PLANNED INTERVENTIONS: self care/ADL training, therapeutic exercise, therapeutic activity, neuromuscular re-education, manual therapy, passive range of motion, balance training, functional mobility training, aquatic therapy, splinting, ultrasound, paraffin, fluidotherapy, moist heat, cryotherapy, patient/family education, cognitive remediation/compensation, energy conservation, and DME and/or AE instructions  RECOMMENDED OTHER SERVICES: current with PT and ST (evals today)  CONSULTED AND AGREED WITH PLAN OF CARE: Patient  PLAN FOR NEXT SESSION: issue closed-chain shoulder flex HEP, ADL strategies   Gurneet Matarese, OTR/L 10/23/2021, 3:59 PM Theone Murdoch, OTR/L [phone] Phone: [phone] 12:24 PM 10/27/21

## 2021-10-27 ENCOUNTER — Ambulatory Visit: Payer: Medicare Other | Admitting: Occupational Therapy

## 2021-10-27 ENCOUNTER — Ambulatory Visit: Payer: Medicare Other | Admitting: Physical Therapy

## 2021-10-27 ENCOUNTER — Ambulatory Visit: Payer: Medicare Other

## 2021-10-27 DIAGNOSIS — R29898 Other symptoms and signs involving the musculoskeletal system: Secondary | ICD-10-CM

## 2021-10-27 DIAGNOSIS — R41841 Cognitive communication deficit: Secondary | ICD-10-CM

## 2021-10-27 DIAGNOSIS — R293 Abnormal posture: Secondary | ICD-10-CM | POA: Diagnosis not present

## 2021-10-27 DIAGNOSIS — R29818 Other symptoms and signs involving the nervous system: Secondary | ICD-10-CM | POA: Diagnosis not present

## 2021-10-27 DIAGNOSIS — M6281 Muscle weakness (generalized): Secondary | ICD-10-CM | POA: Diagnosis not present

## 2021-10-27 DIAGNOSIS — R2681 Unsteadiness on feet: Secondary | ICD-10-CM

## 2021-10-27 DIAGNOSIS — Z9181 History of falling: Secondary | ICD-10-CM | POA: Diagnosis not present

## 2021-10-27 DIAGNOSIS — R278 Other lack of coordination: Secondary | ICD-10-CM

## 2021-10-27 DIAGNOSIS — R2689 Other abnormalities of gait and mobility: Secondary | ICD-10-CM | POA: Diagnosis not present

## 2021-10-27 DIAGNOSIS — R471 Dysarthria and anarthria: Secondary | ICD-10-CM

## 2021-10-27 DIAGNOSIS — R41840 Attention and concentration deficit: Secondary | ICD-10-CM

## 2021-10-27 DIAGNOSIS — R41844 Frontal lobe and executive function deficit: Secondary | ICD-10-CM

## 2021-10-27 DIAGNOSIS — M25611 Stiffness of right shoulder, not elsewhere classified: Secondary | ICD-10-CM

## 2021-10-27 NOTE — Patient Instructions (Addendum)
10-27-21: Lesson 1 Morning  10-27-21: Lesson 1 Evening   10-28-21: Lesson 2 Morning  10-28-21: Lesson 2 Evening  10-29-21: Lesson 3 Morning 10-29-21: Lesson 3 Evening  10-30-21: Lesson 4 Morning 10-30-21: Lesson 4 Evening   10-31-21: Lesson 5 Morning 10-31-21: Lesson 5 Evening   11-01-21: Lesson 6 Morning  11-01-21: Lesson 6 Evening   11-02-21: Lesson 7 Morning  11-02-21: Lesson 7 Evening   11-03-21: Lesson 8 Morning  11-03-21: Lesson 8 Evening   11-04-21: Lesson 9 Morning  11-04-21: Lesson 9 Evening   11-05-21: Lesson 10 Morning  11-05-21: Lesson 10 Evening

## 2021-10-27 NOTE — Therapy (Signed)
OUTPATIENT PHYSICAL THERAPY NEURO TREATMENT   Patient Name: Ronald Noble MRN: [id number] DOB:[birth year], 76 y.o., male Today's Date: 10/27/2021   PCP: [name], MD REFERRING PROVIDER: Star Age, MD   PT End of Session - 10/27/21 [phone]     Visit Number 2    Number of Visits 17    Date for PT Re-Evaluation 01/18/22   Due to delay in scheduling   Authorization Type Medicare & BCBS    PT Start Time 0932    PT Stop Time 1015    PT Time Calculation (min) 43 min    Equipment Utilized During Treatment Gait belt    Activity Tolerance Patient tolerated treatment well    Behavior During Therapy [facility] for tasks assessed/performed;Impulsive              Past Medical History:  Diagnosis Date   BPH (benign prostatic hyperplasia)    COVID 04/24/2020   scratchy throat x 1 day all symptoms resolved   Parkinson disease ([place])    Skin cancer of face    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   Sleep apnea    mild no cpap used   Urethral stricture    Past Surgical History:  Procedure Laterality Date   BURR HOLE W/ STEREOTACTIC INSERTION OF DBS LEADS / INTRAOP MICROELECTRODE RECORDING  04/2014   [facility]   COLONOSCOPY  2019   [place] N/A 06/15/2019   Procedure: CYSTOSCOPY WITH URETHRAL DILATATION;  Surgeon: Franchot Gallo, MD;  Location: [facility];  Service: Urology;  Laterality: N/A;  [place] N/A 05/09/2020   Procedure: CYSTOSCOPY WITH BALLOON DILATATION OF POSTERIOR URETHRAL STRICTURE, FLUOROSCOPIC INTERPRETATION;  Surgeon: Franchot Gallo, MD;  Location: [facility];  Service: Urology;  Laterality: N/A;   KNEE SURGERY  yrs ago   exploratory/left   SKIN CANCER EXCISION  01/2014   face   TRANSURETHRAL RESECTION OF PROSTATE     2008   Patient Active Problem List   Diagnosis Date Noted   Rib pain on right side 12/10/2020    Hyperlipidemia 10/11/2020   Bilateral olecranon bursitis 07/05/2019   Other bursitis of knee, left knee 12/06/2018   Pain in right leg 12/06/2018   Parkinson disease ([place])     ONSET DATE: 10/02/2021 (date of referral)  REFERRING DIAG: G20 (ICD-10-CM) - Parkinson's disease ([place])  THERAPY DIAG:  Unsteadiness on feet  Other lack of coordination  Other abnormalities of gait and mobility  Rationale for Evaluation and Treatment Rehabilitation  SUBJECTIVE:  SUBJECTIVE STATEMENT: Pt reports he has not been doing his exercises or moving much since cutting his leg at the store 3 weeks ago, requiring 12 stitches. Going to MD tomorrow to have wound checked following a week of antibiotics. No new falls since last visit.   Pt accompanied by: self  PERTINENT HISTORY: PD 2008, bilateral DBS (2018); hyperlipidemia   Pt with facial dystonias/dyskinesias with his medications that make it difficult to communicate and keep his eyes open (happens approx. 45 minutes after taking meds)  PAIN:  Are you having pain? No  PRECAUTIONS: Fall Bilateral DBS, No driving, freezing/festination episodes  FALLS: Has patient fallen in last 6 months? Yes. Number of falls Yes. Number of falls 2 with ED visits and stumbles/multiple other falls  (1-2x/month)   PLOF: Independent with basic ADLs, Independent with household mobility without device, and Independent with community mobility with device Independent with basic ADLs, Vocation/Vocational requirements: retired in March 2023, and Leisure: [employer] 2x/wk, stretching/yoga (and therapy exercises), walk (range is limited approx 2 blocks--uses hiking poles)  PATIENT GOALS Wants to work on slowing down.   OBJECTIVE:   COGNITION: Overall cognitive status:  Impaired   TODAY'S TREATMENT:  NMR   [facility] PT Assessment - 10/27/21 0943       Mini-BESTest   Sit To Stand Normal: Comes to stand without use of hands and stabilizes independently.    Rise to Toes Moderate: Heels up, but not full range (smaller than when holding hands), OR noticeable instability for 3 s.    Stand on one leg (left) Moderate: < 20 s    Stand on one leg (right) Severe: Unable    Stand on one leg - lowest score 0    Compensatory Stepping Correction - Forward Normal: Recovers independently with a single, large step (second realignement is allowed).    Compensatory Stepping Correction - Backward Moderate: More than one step is required to recover equilibrium   2 steps   Compensatory Stepping Correction - Left Lateral Moderate: Several steps to recover equilibrium   2 steps   Compensatory Stepping Correction - Right Lateral Normal: Recovers independently with 1 step (crossover or lateral OK)    Stepping Corredtion Lateral - lowest score 1    Stance - Feet together, eyes open, firm surface  Normal: 30s    Stance - Feet together, eyes closed, foam surface  Normal: 30s   Significant anterolateral lean to R side   Incline - Eyes Closed Normal: Stands independently 30s and aligns with gravity   Mild lateral lean to R   Change in Gait Speed Normal: Significantly changes walkling speed without imbalance    Walk with head turns - Horizontal Normal: performs head turns with no change in gait speed and good balance    Walk with pivot turns Moderate:Turns with feet close SLOW (>4 steps) with good balance.    Step over obstacles Moderate: Steps over box but touches box OR displays cautious behavior by slowing gait.    Timed UP & GO with Dual Task Moderate: Dual Task affects either counting OR walking (>10%) when compared to the TUG without Dual Task.    Mini-BEST total score 20      Timed Up and Go Test   Normal TUG (seconds) 6.66    Cognitive TUG (seconds) 12.43   retro counting by 3s  starting at 92   TUG Comments S* throughout             Ther Ex  Reviewed  and updated HEP from previous bout of therapy for improved single leg stability and anterior weight shift w/transfers:  - Standing on one leg w/UE support on chair, 2x30s per side. Min cues for proper body position (standing behind chair) as pt standing to the side at home, causing him to lean to side and lose balance   - Sit to stand with red band pull-apart, x10 reps w/3-5s hold at top of rep. Min cues for proper body mechanics to reduce retro lean against mat.   GAIT: Gait pattern: step through pattern, decreased arm swing- Right, decreased arm swing- Left, decreased stride length, lateral lean- Right, decreased trunk rotation, trunk flexed, and narrow BOS Distance walked: Clinic distances.  Assistive device utilized: Single point cane Level of assistance: CGA and Min A Comments: Pt with eyes closed at end of session majority of the session due to dyskinesias after taking his meds at 9:30 AM. Pt carrying cane rather than using cane for balance throughout session.    PATIENT EDUCATION: Education details: MiniBest results, updates to HEP, Continued to discussed purpose of U-Step Rollator and will practice with it during future sessions (pt has an order from physician, but has not gone through with it as he does not want to use it) Person educated: Patient Education method: Explanation and Handouts Education comprehension: verbalized understanding   HOME EXERCISE PROGRAM: From previous bout of therapy: Seated and standing PWR Up, Quadruped PWR moves on floor, [email or web address]  Access Code: [email or web address] URL: [email or web address] Date: 10/27/2021 Prepared by: Mickie Bail Jeraline Marcinek  Exercises - Standing on one leg in corner   - 1 x daily - 7 x weekly - 1 sets - 3 reps - 30-45 second hold - Sit to stand with band pull-apart   - 1 x daily - 7 x weekly - 3 sets - 10 reps    GOALS: Goals reviewed with  patient? Yes  SHORT TERM GOALS: Target date: 11/18/2021  MiniBEST to finish being assessed with LTG written. Baseline: 20/28 Goal status: MET  2.  Pt will perform at least 8 of 10 reps, minimal to no UE support, with NO retropulsion or posterior lean for imrpoved trasnfer safety and efficiency.  Baseline:  Goal status: INITIAL   LONG TERM GOALS: Target date: 12/16/2021  Pt will be independent with final HEP for improved strength, balance, transfers, and gait.   Baseline:  Goal status: INITIAL  2.  Patient will verbalize fall prevention strategies in the home, including tips to help with festination episodes in order to decrease risk of future falls.  Baseline:  Goal status: INITIAL  3. Pt will improve MiniBest to 24/28 for decreased fall risk and improvement with compensatory stepping strategies.   Baseline: 20/28 Goal status: REVISED  4.  Pt will demonstrate appropriate use of LRAD for indoor and household activities, to assist with gait safety, at least 200 ft with supervision.  Baseline:  Goal status: INITIAL  5.  Pt will perform at least 10 reps with minimal to no UE support from lower/soft surfaces with supervision and no instances of retropulsion in order to demo improvement getting up from the couch. Baseline:  Goal status: INITIAL   ASSESSMENT:  CLINICAL IMPRESSION: Emphasis of skilled PT session on balance assessment and reviewing HEP from previous bout of therapy. Pt scored a 20/28 on MiniBest, reduced from his score of 26/28 following previous bout of PT in 10/23. Noted the most difficulty w/turns due to impulsiveness and single leg stance. Updated HEP to incorporate single leg  stability and anterior weight shifting w/transfers, as pt reports difficulty w/sit <>stands and demonstrates retropulsion. Pt took meds at 9:30am and dyskinesias began to take effect at 10:05, limiting last part of session due to pt's difficulty keeping eyes open. Continue POC.    PERSONAL  FACTORS Behavior pattern, Past/current experiences, Time since onset of injury/illness/exacerbation, and 3+ comorbidities: PD 2008, bilateral DBS (2018); hyperlipidemia, dystonia/dyskinesias  are also affecting patient's functional outcome.    PLAN: PT FREQUENCY: 2x/week  PT DURATION: 12 weeks  PLANNED INTERVENTIONS: Therapeutic exercises, Therapeutic activity, Neuromuscular re-education, Balance training, Gait training, Patient/Family education, Self Care, Stair training, and DME instructions  PLAN FOR NEXT SESSION:  Give pt new band for HEP (he left it on mat). Review previous HEP and update as needed for posture/balance (esp PWR Up! In sitting/standing) and sand other aspects of miniBEST. Work on sit <> stands with incr forward lean. Practice with U-Step Rollator (pt has an order for one, but does not want to use it or go through with it) or determine with AD would be best for pt. Last time he was here, used a metronome with SPC to help slow down his gait which worked well in the past, but dont think pt ever got a clip on metronome to use for home.    Cruzita Lederer Maximum Reiland, PT, DPT 10/27/2021, 10:20 AM

## 2021-10-27 NOTE — Therapy (Signed)
OUTPATIENT SPEECH LANGUAGE PATHOLOGY TREATMENT NOTE   Patient Name: Ronald Noble MRN: [id number] DOB:1945/08/11, 76 y.o., male Today's Date: 10/27/2021  PCP: [name], MD REFERRING PROVIDER: Star Age, MD  END OF SESSION:   End of Session - 10/27/21 0827     Visit Number 2    Number of Visits 17    Date for SLP Re-Evaluation 12/19/21    Authorization Type Medicare/BCBS    SLP Start Time 0848    SLP Stop Time  0932    SLP Time Calculation (min) 44 min    Activity Tolerance Patient tolerated treatment well             Past Medical History:  Diagnosis Date   BPH (benign prostatic hyperplasia)    COVID 04/24/2020   scratchy throat x 1 day all symptoms resolved   Parkinson disease ([place])    Skin cancer of face    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   Sleep apnea    mild no cpap used   Urethral stricture    Past Surgical History:  Procedure Laterality Date   BURR HOLE W/ STEREOTACTIC INSERTION OF DBS LEADS / INTRAOP MICROELECTRODE RECORDING  04/2014   [facility] in 2020   COLONOSCOPY  2019   CYSTOSCOPY WITH URETHRAL DILATATION N/A 06/15/2019   Procedure: CYSTOSCOPY WITH URETHRAL DILATATION;  Surgeon: Franchot Gallo, MD;  Location: [facility];  Service: Urology;  Laterality: N/A;  [place] N/A 05/09/2020   Procedure: CYSTOSCOPY WITH BALLOON DILATATION OF POSTERIOR URETHRAL STRICTURE, FLUOROSCOPIC INTERPRETATION;  Surgeon: Franchot Gallo, MD;  Location: [facility];  Service: Urology;  Laterality: N/A;   KNEE SURGERY  yrs ago   exploratory/left   SKIN CANCER EXCISION  01/2014   face   TRANSURETHRAL RESECTION OF PROSTATE     2008   Patient Active Problem List   Diagnosis Date Noted   Rib pain on right side 12/10/2020   Hyperlipidemia 10/11/2020   Bilateral olecranon bursitis 07/05/2019   Other bursitis of knee, left knee 12/06/2018   Pain in right  leg 12/06/2018   Parkinson disease ([place])     ONSET DATE: PD dx 2008; 10/02/2021 (referral)  REFERRING DIAG: (ICD-10-CM) - Dysarthria and anarthria R26.81  THERAPY DIAG:  Dysarthria and anarthria  Cognitive communication deficit  Rationale for Evaluation and Treatment Rehabilitation  SUBJECTIVE: "I got my book"  PAIN:  Are you having pain? No   OBJECTIVE:  TODAY'S TREATMENT:  10-27-21: Educated patient on Speak Out! Program and principles to target intentional speech. Targeted improving vocal quality and increasing intensity through progressively difficulty speech tasks using Speak Out! program, lesson 1. ST leads pt through exercises providing usual model prior to pt execution. Usual min-A required to achieve target dB this date. Averages this date: loud "ah" 92 dB; reading 81 dB; cognitive speech task 77 dB. Conversational sample of approx 5 minutes, pt averages 70 dB with occasional min-A to maintain targeted loudness (70-72 dB) due to volume decay. Some confusion reported re: when to complete Speak Out! Lessons with handout provided to assist with comprehension and carryover.  10-20-21: Education provided on initiation of POC to address hypokinetic dysarthria and cognitive changes. Handout and instruction provided to order Speak Out! Workbook.      PATIENT EDUCATION: Education details: see above Person educated: Patient Education method: Explanation, Demonstration, and Handouts Education comprehension: verbalized understanding, returned demonstration, and needs further education  HOME EXERCISE PROGRAM: Speak Out!     GOALS: Goals reviewed with patient? Yes   SHORT TERM GOALS: Target date: 11/17/2021     Pt will complete Speak Out! HEP at least 1x/day given occasional min A over 2 sessions  Baseline: Goal status: ongoing   2.  Pt will achieve targeted dB (85-90 dB) on warm up exercises with 80% accuracy given occasional min A over 2 sessions  Baseline:  Goal  status: ongoing   3.  Pt will achieve targeted dB (75-85 dB) on reading exercises with 80% accuracy given occasional min A over 2 sessions Baseline:  Goal status: ongoing   4.  Pt will achieve targeted dB (72-78 dB) on cognitive exercises with 80% accuracy given occasional min A over 2 sessions Baseline:  Goal status: ongoing   5.  Pt will utilize dysarthria compensations in 5-10 minute conversation to optimize vocal intensity and clarity given occasional min A over 2 sessions  Baseline:  Goal status: ongoing   6.  Pt will utilize internal/external memory and attention aids to optimize medication management and completion of daily tasks given occasional min A over 2 sessions  Baseline:  Goal status: ongoing   LONG TERM GOALS: Target date: 12/19/2021     Pt will complete Speak Out! HEP at least 1x/day (BID recommended) > 1 week  Baseline:  Goal status: ongoing   2.  Pt will achieve targeted dB levels in demonstration of Speak Out! Lessons with 90% accuracy given rare min A over 2 sessions  Baseline:  Goal status: ongoing   3.  Pt will utilize dysarthria compensations in 15+ minute conversation to optimize vocal intensity and clarity given rare min A over 2 sessions Baseline:  Goal status: ongoing   4.  Pt will utilize internal/external memory and attention aids to optimize medication management and completion of daily tasks given rare min A over 2 sessions  Baseline:  Goal status: ongoing   5.  Pt will report improved cognitive communication via PROM by 2 point improvements by last ST session  Baseline: CF=84; CPIB=5 Goal status: ongoing       ASSESSMENT:   CLINICAL IMPRESSION: "Ronald Noble" is a 76 y.o. male who was seen today for repeat evaluation related to progressive Parkinson's Disease. Last course of ST treatment was March to May 2022. Today, pt presents with decline in conversational volume (averaged upper 60s dB). Pt reported awareness of volume decline and reduced  articulatory precision since last course of ST, with more frequent repetition requested. Pt stated "my wife is asking me to repeat more." No overt rushes in speech exhibited today but SLP expects fluctuations per previous course of SLP intervention. Pt also endorsed some changes in memory and possibly attention with wife having to repeat conversation more frequently and some troubling attending to medication times. Pt denied any overt changes in swallow function but does endorse some dry mouth. Intermittent throat clearing also observed. Initiated education and instruction of Speak Out! Program to maximize vocal intensity and clarity secondary to PD related speech changes. Given change in cognition and decline in speech intelligibility, skilled ST intervention is recommended to optimize communication effectiveness as well as increase functional independence and safety through use of cognitive compensatory training.    OBJECTIVE IMPAIRMENTS  Objective impairments include attention, memory, executive functioning, and dysarthria. These impairments are limiting patient from managing medications, household responsibilities, and effectively communicating at home and in community.Factors affecting potential to achieve goals and functional outcome are ability to learn/carryover  information, cooperation/participation level, and medical prognosis. Patient will benefit from skilled SLP services to address above impairments and improve overall function.   REHAB POTENTIAL: Fair - hx of reduced HEP compliance    PLAN: SLP FREQUENCY: 2x/week   SLP DURATION: 8 weeks   PLANNED INTERVENTIONS: Cueing hierachy, Cognitive reorganization, Internal/external aids, Functional tasks, Multimodal communication approach, SLP instruction and feedback, Compensatory strategies, and Patient/family education   Marzetta Board, CCC-SLP 10/27/2021, 11:53 AM

## 2021-10-28 DIAGNOSIS — L089 Local infection of the skin and subcutaneous tissue, unspecified: Secondary | ICD-10-CM | POA: Diagnosis not present

## 2021-10-28 DIAGNOSIS — L97929 Non-pressure chronic ulcer of unspecified part of left lower leg with unspecified severity: Secondary | ICD-10-CM | POA: Diagnosis not present

## 2021-10-28 DIAGNOSIS — S81812S Laceration without foreign body, left lower leg, sequela: Secondary | ICD-10-CM | POA: Diagnosis not present

## 2021-10-28 DIAGNOSIS — I83029 Varicose veins of left lower extremity with ulcer of unspecified site: Secondary | ICD-10-CM | POA: Diagnosis not present

## 2021-11-03 DIAGNOSIS — L97929 Non-pressure chronic ulcer of unspecified part of left lower leg with unspecified severity: Secondary | ICD-10-CM | POA: Diagnosis not present

## 2021-11-03 DIAGNOSIS — I83029 Varicose veins of left lower extremity with ulcer of unspecified site: Secondary | ICD-10-CM | POA: Diagnosis not present

## 2021-11-03 DIAGNOSIS — S81812S Laceration without foreign body, left lower leg, sequela: Secondary | ICD-10-CM | POA: Diagnosis not present

## 2021-11-03 DIAGNOSIS — L089 Local infection of the skin and subcutaneous tissue, unspecified: Secondary | ICD-10-CM | POA: Diagnosis not present

## 2021-11-04 NOTE — Therapy (Unsigned)
OUTPATIENT SPEECH LANGUAGE PATHOLOGY TREATMENT NOTE   Patient Name: Ronald Noble MRN: [id number] DOB:[birth year], 76 y.o., male Today's Date: 11/05/2021  PCP: [name], MD REFERRING PROVIDER: Star Age, MD  END OF SESSION:   End of Session - 11/05/21 0932     Visit Number 3    Number of Visits 17    Date for SLP Re-Evaluation 12/19/21    Authorization Type Medicare/BCBS    SLP Start Time 0932    SLP Stop Time  0086    SLP Time Calculation (min) 43 min    Activity Tolerance Patient tolerated treatment well              Past Medical History:  Diagnosis Date   BPH (benign prostatic hyperplasia)    COVID 04/24/2020   scratchy throat x 1 day all symptoms resolved   Parkinson disease ([place])    Skin cancer of face    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   Sleep apnea    mild no cpap used   Urethral stricture    Past Surgical History:  Procedure Laterality Date   BURR HOLE W/ STEREOTACTIC INSERTION OF DBS LEADS / INTRAOP MICROELECTRODE RECORDING  04/2014   [facility] in [facility]   COLONOSCOPY  2019   CYSTOSCOPY WITH URETHRAL DILATATION N/A 06/15/2019   Procedure: CYSTOSCOPY WITH URETHRAL DILATATION;  Surgeon: Franchot Gallo, MD;  Location: [facility];  Service: Urology;  Laterality: N/A;  [place] N/A 05/09/2020   Procedure: CYSTOSCOPY WITH BALLOON DILATATION OF POSTERIOR URETHRAL STRICTURE, FLUOROSCOPIC INTERPRETATION;  Surgeon: Franchot Gallo, MD;  Location: [facility];  Service: Urology;  Laterality: N/A;   KNEE SURGERY  yrs ago   exploratory/left   SKIN CANCER EXCISION  01/2014   face   TRANSURETHRAL RESECTION OF PROSTATE     2008   Patient Active Problem List   Diagnosis Date Noted   Rib pain on right side 12/10/2020   Hyperlipidemia 10/11/2020   Bilateral olecranon bursitis 07/05/2019   Other bursitis of knee, left knee 12/06/2018   Pain in right  leg 12/06/2018   Parkinson disease ([place])     ONSET DATE: PD dx 2008; 10/02/2021 (referral)  REFERRING DIAG: (ICD-10-CM) - Dysarthria and anarthria R26.81  THERAPY DIAG:  Cognitive communication deficit  Dysarthria and anarthria  Rationale for Evaluation and Treatment Rehabilitation  SUBJECTIVE: "yes, I've done this book. So far so good"   PAIN:  Are you having pain? No   OBJECTIVE:  TODAY'S TREATMENT:  11-05-21: SLP assisted pt is signing up for [place] webinar and showed pt where he can finding the SLP led home practice videos to aid in completion of HEP. Target improving vocal quality and increasing intensity through progressively difficulty speech tasks using Speak Out! program, lesson 3. ST leads pt through exercises providing usual model prior to pt execution. occasional min-A required to achieve target dB this date. Averages this date: loud "ah" 91 dB; counting 83 dB, reading 75 dB; cognitive speech task 73 dB. required this date during structured practice.  Conversational sample of approx 10 minutes, pt averages 68 dB with usual min-A.   10-27-21: Educated patient on Speak Out! Program and principles to target intentional speech. Targeted improving vocal quality and increasing intensity through progressively difficulty speech tasks using Speak Out! program, lesson 1. ST leads pt through exercises providing usual model prior to pt execution. Usual min-A required to achieve  target dB this date. Averages this date: loud "ah" 92 dB; reading 81 dB; cognitive speech task 77 dB. Conversational sample of approx 5 minutes, pt averages 70 dB with occasional min-A to maintain targeted loudness (70-72 dB) due to volume decay. Some confusion reported re: when to complete Speak Out! Lessons with handout provided to assist with comprehension and carryover.  10-20-21: Education provided on initiation of POC to address hypokinetic dysarthria and cognitive changes. Handout and  instruction provided to order Speak Out! Workbook.      PATIENT EDUCATION: Education details: see above Person educated: Patient Education method: Explanation, Demonstration, and Handouts Education comprehension: verbalized understanding, returned demonstration, and needs further education     HOME EXERCISE PROGRAM: Speak Out!     GOALS: Goals reviewed with patient? Yes   SHORT TERM GOALS: Target date: 11/17/2021     Pt will complete Speak Out! HEP at least 1x/day given occasional min A over 2 sessions  Baseline: Goal status: ongoing   2.  Pt will achieve targeted dB (85-90 dB) on warm up exercises with 80% accuracy given occasional min A over 2 sessions  Baseline:  Goal status: ongoing   3.  Pt will achieve targeted dB (75-85 dB) on reading exercises with 80% accuracy given occasional min A over 2 sessions Baseline:  Goal status: ongoing   4.  Pt will achieve targeted dB (72-78 dB) on cognitive exercises with 80% accuracy given occasional min A over 2 sessions Baseline:  Goal status: ongoing   5.  Pt will utilize dysarthria compensations in 5-10 minute conversation to optimize vocal intensity and clarity given occasional min A over 2 sessions  Baseline:  Goal status: ongoing   6.  Pt will utilize internal/external memory and attention aids to optimize medication management and completion of daily tasks given occasional min A over 2 sessions  Baseline:  Goal status: ongoing   LONG TERM GOALS: Target date: 12/19/2021     Pt will complete Speak Out! HEP at least 1x/day (BID recommended) > 1 week  Baseline:  Goal status: ongoing   2.  Pt will achieve targeted dB levels in demonstration of Speak Out! Lessons with 90% accuracy given rare min A over 2 sessions  Baseline:  Goal status: ongoing   3.  Pt will utilize dysarthria compensations in 15+ minute conversation to optimize vocal intensity and clarity given rare min A over 2 sessions Baseline:  Goal status: ongoing    4.  Pt will utilize internal/external memory and attention aids to optimize medication management and completion of daily tasks given rare min A over 2 sessions  Baseline:  Goal status: ongoing   5.  Pt will report improved cognitive communication via PROM by 2 point improvements by last ST session  Baseline: CF=84; CPIB=5 Goal status: ongoing       ASSESSMENT:   CLINICAL IMPRESSION: "[name]" is a 76 y.o. male who was seen today for repeat evaluation related to progressive Parkinson's Disease. Last course of ST treatment was March to May 2022. Today, pt presents with decline in conversational volume (averaged upper 60s dB). Pt reported awareness of volume decline and reduced articulatory precision since last course of ST, with more frequent repetition requested. Pt stated "my wife is asking me to repeat more." No overt rushes in speech exhibited today but SLP expects fluctuations per previous course of SLP intervention. Pt also endorsed some changes in memory and possibly attention with wife having to repeat conversation more frequently and some troubling attending  to medication times. Pt denied any overt changes in swallow function but does endorse some dry mouth. Intermittent throat clearing also observed. Initiated education and instruction of Speak Out! Program to maximize vocal intensity and clarity secondary to PD related speech changes. Given change in cognition and decline in speech intelligibility, skilled ST intervention is recommended to optimize communication effectiveness as well as increase functional independence and safety through use of cognitive compensatory training.    OBJECTIVE IMPAIRMENTS  Objective impairments include attention, memory, executive functioning, and dysarthria. These impairments are limiting patient from managing medications, household responsibilities, and effectively communicating at home and in community.Factors affecting potential to achieve goals and  functional outcome are ability to learn/carryover information, cooperation/participation level, and medical prognosis. Patient will benefit from skilled SLP services to address above impairments and improve overall function.   REHAB POTENTIAL: Fair - hx of reduced HEP compliance    PLAN: SLP FREQUENCY: 2x/week   SLP DURATION: 8 weeks   PLANNED INTERVENTIONS: Cueing hierachy, Cognitive reorganization, Internal/external aids, Functional tasks, Multimodal communication approach, SLP instruction and feedback, Compensatory strategies, and Patient/family education   Su Monks, CCC-SLP 11/05/2021, 10:10 AM

## 2021-11-05 ENCOUNTER — Ambulatory Visit: Payer: Medicare Other | Attending: Orthopaedic Surgery | Admitting: Occupational Therapy

## 2021-11-05 ENCOUNTER — Ambulatory Visit: Payer: Medicare Other | Admitting: Speech Pathology

## 2021-11-05 ENCOUNTER — Ambulatory Visit: Payer: Medicare Other | Admitting: Physical Therapy

## 2021-11-05 ENCOUNTER — Encounter: Payer: Self-pay | Admitting: Physical Therapy

## 2021-11-05 DIAGNOSIS — Z9181 History of falling: Secondary | ICD-10-CM | POA: Diagnosis not present

## 2021-11-05 DIAGNOSIS — R41844 Frontal lobe and executive function deficit: Secondary | ICD-10-CM | POA: Insufficient documentation

## 2021-11-05 DIAGNOSIS — R41841 Cognitive communication deficit: Secondary | ICD-10-CM | POA: Insufficient documentation

## 2021-11-05 DIAGNOSIS — R29818 Other symptoms and signs involving the nervous system: Secondary | ICD-10-CM | POA: Diagnosis not present

## 2021-11-05 DIAGNOSIS — R278 Other lack of coordination: Secondary | ICD-10-CM | POA: Diagnosis not present

## 2021-11-05 DIAGNOSIS — R2681 Unsteadiness on feet: Secondary | ICD-10-CM | POA: Diagnosis not present

## 2021-11-05 DIAGNOSIS — R41840 Attention and concentration deficit: Secondary | ICD-10-CM | POA: Diagnosis not present

## 2021-11-05 DIAGNOSIS — M25611 Stiffness of right shoulder, not elsewhere classified: Secondary | ICD-10-CM | POA: Diagnosis not present

## 2021-11-05 DIAGNOSIS — R293 Abnormal posture: Secondary | ICD-10-CM | POA: Insufficient documentation

## 2021-11-05 DIAGNOSIS — R29898 Other symptoms and signs involving the musculoskeletal system: Secondary | ICD-10-CM | POA: Diagnosis not present

## 2021-11-05 DIAGNOSIS — R471 Dysarthria and anarthria: Secondary | ICD-10-CM

## 2021-11-05 DIAGNOSIS — R2689 Other abnormalities of gait and mobility: Secondary | ICD-10-CM

## 2021-11-05 NOTE — Therapy (Signed)
OUTPATIENT PHYSICAL THERAPY NEURO TREATMENT   Patient Name: Ronald Noble MRN: [id number] DOB:08/04/1945, 76 y.o., male Today's Date: 11/05/2021   PCP: [name], MD REFERRING PROVIDER: Star Age, MD   PT End of Session - 11/05/21 [id number]     Visit Number 3    Number of Visits 17    Date for PT Re-Evaluation 01/18/22   Due to delay in scheduling   Authorization Type Medicare & BCBS    PT Start Time 0804    PT Stop Time 0844    PT Time Calculation (min) 40 min    Equipment Utilized During Treatment Gait belt    Activity Tolerance Patient tolerated treatment well    Behavior During Therapy [facility] for tasks assessed/performed;Impulsive              Past Medical History:  Diagnosis Date   BPH (benign prostatic hyperplasia)    COVID 04/24/2020   scratchy throat x 1 day all symptoms resolved   Parkinson disease ([place])    Skin cancer of face    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   Sleep apnea    mild no cpap used   Urethral stricture    Past Surgical History:  Procedure Laterality Date   BURR HOLE W/ STEREOTACTIC INSERTION OF DBS LEADS / INTRAOP MICROELECTRODE RECORDING  04/2014   [facility] in 2020   COLONOSCOPY  2019   [place] N/A 06/15/2019   Procedure: CYSTOSCOPY WITH URETHRAL DILATATION;  Surgeon: Franchot Gallo, MD;  Location: [facility];  Service: Urology;  Laterality: N/A;  [place] N/A 05/09/2020   Procedure: CYSTOSCOPY WITH BALLOON DILATATION OF POSTERIOR URETHRAL STRICTURE, FLUOROSCOPIC INTERPRETATION;  Surgeon: Franchot Gallo, MD;  Location: [facility];  Service: Urology;  Laterality: N/A;   KNEE SURGERY  yrs ago   exploratory/left   SKIN CANCER EXCISION  01/2014   face   TRANSURETHRAL RESECTION OF PROSTATE     2008   Patient Active Problem List   Diagnosis Date Noted   Rib pain on right side 12/10/2020    Hyperlipidemia 10/11/2020   Bilateral olecranon bursitis 07/05/2019   Other bursitis of knee, left knee 12/06/2018   Pain in right leg 12/06/2018   Parkinson disease ([place])     ONSET DATE: 10/02/2021 (date of referral)  REFERRING DIAG: G20 (ICD-10-CM) - Parkinson's disease ([place])  THERAPY DIAG:  Unsteadiness on feet  Other abnormalities of gait and mobility  Other symptoms and signs involving the nervous system  Abnormal posture  Rationale for Evaluation and Treatment Rehabilitation  SUBJECTIVE:  SUBJECTIVE STATEMENT: No changes, no falls. Reports that wound on his leg is healing well.   Pt accompanied by: self  PERTINENT HISTORY: PD 2008, bilateral DBS (2018); hyperlipidemia   Pt with facial dystonias/dyskinesias with his medications that make it difficult to communicate and keep his eyes open (happens approx. 45 minutes after taking meds)  PAIN:  Are you having pain? No  PRECAUTIONS: Fall Bilateral DBS, No driving, freezing/festination episodes  FALLS: Has patient fallen in last 6 months? Yes. Number of falls Yes. Number of falls 2 with ED visits and stumbles/multiple other falls  (1-2x/month)   PLOF: Independent with basic ADLs, Independent with household mobility without device, and Independent with community mobility with device Independent with basic ADLs, Vocation/Vocational requirements: retired in March 2023, and Leisure: [employer] 2x/wk, stretching/yoga (and therapy exercises), walk (range is limited approx 2 blocks--uses hiking poles)  PATIENT GOALS Wants to work on slowing down.   OBJECTIVE:   COGNITION: Overall cognitive status: Impaired   TODAY'S TREATMENT:  NMR  Sit to stands throughout session with focus on proper technique and taking his time before standing,  big focus on wider BOS, tucking feet under and incr forward lean with tall posture in standing x10 reps, plus additional reps throughout session.   Pt performs PWR! Moves (reviewed from previous HEP):    PWR! Up for improved posture in sitting x10 reps, in standing x10 reps (cued for wider BOS)  Pt able to perform with slower pace. Educated on purpose and importance of performing these exercises for HEP.      GAIT: Gait pattern: step through pattern, decreased arm swing- Right, decreased arm swing- Left, decreased stride length, lateral lean- Right, decreased trunk rotation, trunk flexed, and narrow BOS Distance walked:345' x 1 indoors, 500' x1 outdoors on pavement, 115' x 1 indoors Assistive device utilized: Single point cane into session, U-Step Rollator during session.  Level of assistance: CGA and Supervision.  Comments: Pt comes in during session holding his SPC.   Practiced during session with U-Step rollator (pt has gotten an order from his physician), but has not filled out additional paperwork that he needs). Educated on use and how to work it regarding brakes. Practiced over level indoor surfaces with cues for posture, stride length, and incr foot clearance (esp with RLE). Pt demonstrating good speed of gait with more control and posture with pt only needing supervision. Practiced on paved surfaces outdoors with intermittent cues to keep feet closer under the seat of rollator. Pt reporting that he overall feels like he can take longer strides and feels more secure when outdoors. Pt feels as if he was pressing too hard down on his arms with the brakes. PT adjusted and made it one notch higher, with pt reporting that this felt better. Educated on purpose and use of this rollator to help with gait mechanics and decr pt's risk of falls. Pt in agreement to continue to work with it in therapy and begin to fill out paperwork to obtain one.    PATIENT EDUCATION: Education details: Nurse, children's with U-Step Rollator and following up with paperwork needed for one (pt has order but has not gone  Person educated: Patient Education method: Theatre stage manager Education comprehension: verbalized understanding   HOME EXERCISE PROGRAM: From previous bout of therapy: Seated and standing PWR Up, Quadruped PWR moves on floor, [place]  Access Code: [email or web address] URL: [email or web address] Date: 10/27/2021 Prepared by: Mickie Bail Plaster  Exercises - Standing on one leg in corner   -  1 x daily - 7 x weekly - 1 sets - 3 reps - 30-45 second hold - Sit to stand with band pull-apart   - 1 x daily - 7 x weekly - 3 sets - 10 reps    GOALS: Goals reviewed with patient? Yes  SHORT TERM GOALS: Target date: 11/18/2021  MiniBEST to finish being assessed with LTG written. Baseline: 20/28 Goal status: MET  2.  Pt will perform at least 8 of 10 reps, minimal to no UE support, with NO retropulsion or posterior lean for imrpoved trasnfer safety and efficiency.  Baseline:  Goal status: INITIAL   LONG TERM GOALS: Target date: 12/16/2021  Pt will be independent with final HEP for improved strength, balance, transfers, and gait.   Baseline:  Goal status: INITIAL  2.  Patient will verbalize fall prevention strategies in the home, including tips to help with festination episodes in order to decrease risk of future falls.  Baseline:  Goal status: INITIAL  3. Pt will improve MiniBest to 24/28 for decreased fall risk and improvement with compensatory stepping strategies.   Baseline: 20/28 Goal status: REVISED  4.  Pt will demonstrate appropriate use of LRAD for indoor and household activities, to assist with gait safety, at least 200 ft with supervision.  Baseline:  Goal status: INITIAL  5.  Pt will perform at least 10 reps with minimal to no UE support from lower/soft surfaces with supervision and no instances of retropulsion in order to demo improvement getting up from the  couch. Baseline:  Goal status: INITIAL   ASSESSMENT:  CLINICAL IMPRESSION: Today's skilled session focused on reviewing techniques for sit <> stands for wider BOS for improved balance, reviewing seated/standing PWR moves from HEP, and working on gait training with U-Step Rollator. Pt demonstrating improvement in gait mechanics (improved posture, stride length, foot clearance) and slowing down gait speed. Pt had no festination episodes throughout use of U-Step Rollator. Pt also reporting that he does feel more stable and felt like he could take longer strides with it. Will continue to practice during session. Will continue to progress towards LTGs.    PERSONAL FACTORS Behavior pattern, Past/current experiences, Time since onset of injury/illness/exacerbation, and 3+ comorbidities: PD 2008, bilateral DBS (2018); hyperlipidemia, dystonia/dyskinesias  are also affecting patient's functional outcome.    PLAN: PT FREQUENCY: 2x/week  PT DURATION: 12 weeks  PLANNED INTERVENTIONS: Therapeutic exercises, Therapeutic activity, Neuromuscular re-education, Balance training, Gait training, Patient/Family education, Self Care, Stair training, and DME instructions  PLAN FOR NEXT SESSION:  Work with both bilat walking poles, inside and then outside. Going around obstacles with U-Step Rollator.  Give pt new band for HEP (he left it on mat). Work on sit <> stands with incr forward lean. Stepping/foot clearance tasks. Practice with U-Step Rollator (pt has an order for one, but does not want to use it or go through with it) or determine with AD would be best for pt. Last time he was here, used a metronome with SPC to help slow down his gait which worked well in the past, but dont think pt ever got a clip on metronome to use for home.    Arliss Journey, PT, DPT 11/05/2021, 8:46 AM

## 2021-11-05 NOTE — Therapy (Signed)
OUTPATIENT OCCUPATIONAL THERAPY NEURO Treatment  Patient Name: Ronald Noble MRN: [id number] DOB:1946/03/27, 76 y.o., male Today's Date: 11/05/2021  PCP: Dr. [name] REFERRING PROVIDER: Dr. Star Age   OT End of Session - 11/05/21 0813     Visit Number 3    Number of Visits 17    Date for OT Re-Evaluation 01/18/22    Authorization Type Medicare and BCBS--covered 100%    Authorization Time Period cert period 10/10/80-07/28/51    Authorization - Visit Number 3    Authorization - Number of Visits 10    Progress Note Due on Visit 10    OT Start Time 0846    OT Stop Time 0925    OT Time Calculation (min) 39 min    Activity Tolerance Patient tolerated treatment well    Behavior During Therapy [facility] for tasks assessed/performed;Impulsive              Past Medical History:  Diagnosis Date   BPH (benign prostatic hyperplasia)    COVID 04/24/2020   scratchy throat x 1 day all symptoms resolved   Parkinson disease ([place])    Skin cancer of face    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   Sleep apnea    mild no cpap used   Urethral stricture    Past Surgical History:  Procedure Laterality Date   BURR HOLE W/ STEREOTACTIC INSERTION OF DBS LEADS / INTRAOP MICROELECTRODE RECORDING  04/2014   [facility] in [facility]   COLONOSCOPY  2019   [place] N/A 06/15/2019   Procedure: CYSTOSCOPY WITH URETHRAL DILATATION;  Surgeon: Franchot Gallo, MD;  [facility];  Service: Urology;  Laterality: N/A;  [place] N/A 05/09/2020   Procedure: CYSTOSCOPY WITH BALLOON DILATATION OF POSTERIOR URETHRAL STRICTURE, FLUOROSCOPIC INTERPRETATION;  Surgeon: Franchot Gallo, MD;  Location: [facility];  Service: Urology;  Laterality: N/A;   KNEE SURGERY  yrs ago   exploratory/left   SKIN CANCER EXCISION  01/2014   face   TRANSURETHRAL RESECTION OF PROSTATE     2008    Patient Active Problem List   Diagnosis Date Noted   Rib pain on right side 12/10/2020   Hyperlipidemia 10/11/2020   Bilateral olecranon bursitis 07/05/2019   Other bursitis of knee, left knee 12/06/2018   Pain in right leg 12/06/2018   Parkinson disease ([place])     ONSET DATE: 10/02/21  (referral date, recommended from previous therapy d/c)  REFERRING DIAG: Parkinson's Disease   THERAPY DIAG:  Unsteadiness on feet  Other lack of coordination  Other abnormalities of gait and mobility  Dysarthria and anarthria  Other symptoms and signs involving the nervous system  Other symptoms and signs involving the musculoskeletal system  Abnormal posture  Stiffness of right shoulder, not elsewhere classified  Attention and concentration deficit  Frontal lobe and executive function deficit  Rationale for Evaluation and Treatment Rehabilitation  SUBJECTIVE:   SUBJECTIVE STATEMENT: Pt reports that he stopped working in March and stopped driving in February/March  Pt accompanied by: self  PERTINENT HISTORY: Parkinson's Disease s/p bilateral DBS  PMH:  hx of skin CA, sleep apnea, bilateral DBS   PRECAUTIONS: Fall and Other: Bilateral DBS  WEIGHT BEARING RESTRICTIONS No  PAIN:  Are you having pain? No  FALLS: Has patient fallen in last 6 months? Yes. Number of falls 2 with ED visits and stumbles/multiple other falls  (1-2x/month)  LIVING  ENVIRONMENT: Lives with: lives with their spouse  PLOF: Independent with basic ADLs, Vocation/Vocational requirements: retired in March 2023, and Leisure: [employer] 2x/wk, stretching/yoga (and therapy exercises), walk (range is limited approx 2 blocks--uses hiking poles)  PATIENT GOALS improve coordination, ADLs  OBJECTIVE:         OBSERVATIONS: Pt reports facial dystonias associated with medications which make it difficult to communicate and difficult to keep eyes open.  Pt with hx of dystonia/dyskinesias depending on  medication cycle.  Bradykinesia noted, impulsivity noted at times.   TODAY'S TREATMENT:  supine PWR! Up, rock and twist, min v.c for performance and amplitude, While pt was performing he was noted to have an imbedded tick Therapist and supervisor encouraged pt to go to the MD to get tick removed. Pt chooses to continue with therapy. Pt called his wife to make her aware. PWR! Hands, min v.c Pt was educated in beginning coordination HEP, see pt instructions. Mod v.c for upright posture and avoiding right lateral lean. Mirror was utilized for Warden/ranger. PATIENT EDUCATION: Education details: supine PWR! Basic 4, with PWR! step broken down separately Person educated: Patient Education method: Explanation, demonstration, verbal cues Education comprehension: verbalized understanding, returned demonstration.   HOME EXERCISE PROGRAM:   GOALS: Potential Goals reviewed with patient? Yes  SHORT TERM GOALS: Target date: 11/20/21  Pt will be independent be updated PD-specific HEP. Goal status: INITIAL  2.  Pt will demo at least 135* R shoulder flex for functional reaching  Baseline:  125* Goal status: INITIAL  3.  Pt will write at least 4 sentences with good size/legibility. Goal status: INITIAL  4.  Pt will improve L hand coordination for ADLs as shown by improving time on 9-hole peg test by at least 5sec. Baseline:  47.09sec Goal status: INITIAL  5.  Pt will verbalize understanding of memory/cognitive compensation strategies. Goal status: INITIAL   LONG TERM GOALS: Target date: 01/19/22  Pt will be independent with strategies/AE for ADLs/IADLs.(Including strategy for donning/ doffing jacket) Goal status: INITIAL  2.  Pt will improve L hand coordination for ADLs as shown by improving time on 9-hole peg test by at least 8sec. Baseline: 47.09sec Goal status: INITIAL  3.  Pt will improve bilateral functional reaching/coordination as shown by improving score on box and blocks  test by at 4 blocks with LUE. Baseline: L-44 blocks Goal status: INITIAL  4.  Pt will improve bilateral functional reaching/coordination as shown by improving score on box and blocks test by at 4 blocks with RUE. Baseline:  R-47 blocks Goal status: INITIAL  5.  Pt will increase bilateral staGoal status: INITIAL  6.  Pt will demonstrate improved ease with fastening buttons as evidenced by decreasing 3 button/ unbutton time to :49 secs or less.  Baseline: 53.46 Goal status: INITIAL     ASSESSMENT:  CLINICAL IMPRESSION: Pt is progressing towards goals. He demonstrates understanding of beginning coordination HEP. Pt will benefit from reinforcement of upright posture and avoiding right lateral lean.  PERFORMANCE DEFICITS in functional skills including ADLs, IADLs, coordination, dexterity, tone, ROM, flexibility, FMC, GMC, mobility, balance, body mechanics, endurance, decreased knowledge of precautions, decreased knowledge of use of DME, and UE functional use, cognitive skills including attention, memory, perception, and safety awareness, and psychosocial skills including environmental adaptation and habits.   IMPAIRMENTS are limiting patient from ADLs, IADLs, and leisure.   COMORBIDITIES may have co-morbidities  that affects occupational performance. Patient will benefit from skilled OT to address above impairments and improve overall  function.  MODIFICATION OR ASSISTANCE TO COMPLETE EVALUATION: Min-Moderate modification of tasks or assist with assess necessary to complete an evaluation.  OT OCCUPATIONAL PROFILE AND HISTORY: Detailed assessment: Review of records and additional review of physical, cognitive, psychosocial history related to current functional performance.  CLINICAL DECISION MAKING: Moderate - several treatment options, min-mod task modification necessary  REHAB POTENTIAL: Good  EVALUATION COMPLEXITY: Moderate    PLAN: OT FREQUENCY: 2x/week  OT DURATION: 8 weeks  +eval or 17 visits over 12 weeks for scheduling  PLANNED INTERVENTIONS: self care/ADL training, therapeutic exercise, therapeutic activity, neuromuscular re-education, manual therapy, passive range of motion, balance training, functional mobility training, aquatic therapy, splinting, ultrasound, paraffin, fluidotherapy, moist heat, cryotherapy, patient/family education, cognitive remediation/compensation, energy conservation, and DME and/or AE instructions  RECOMMENDED OTHER SERVICES: current with PT and ST (evals today)  CONSULTED AND AGREED WITH PLAN OF CARE: Patient  PLAN FOR NEXT SESSION: issue closed-chain shoulder flex HEP, ADL strategies   Lyndon Chapel, OTR/L 11/05/2021, 8:17 AM Theone Murdoch, OTR/L [phone] Phone: [phone] 8:17 AM 11/05/21

## 2021-11-05 NOTE — Patient Instructions (Signed)
Coordination Exercises  Perform the following exercises for 20 minutes 1 times per day. Perform with both hand(s). Perform using big movements.  Flipping Cards: Place deck of cards on the table. Flip cards over by opening your hand big to grasp and then turn your palm up big. Deal cards: Hold 1/2 or whole deck in your hand. Use thumb to push card off top of deck with one big push. Pick up 5-10 coins one at a time and hold in palm. Then, move coins from palm to fingertips one at a time to stack. Practice writing: Slow down, write big, and focus on forming each letter. Perform "Flicks"/hand stretches (PWR! Hands): Close hands then flick out your fingers with focus on opening hands, pulling wrists back, and extending elbows like you are pushing.  Focus on upright posture and not leaning to right side

## 2021-11-07 ENCOUNTER — Ambulatory Visit: Payer: Medicare Other | Admitting: Physical Therapy

## 2021-11-07 ENCOUNTER — Encounter: Payer: Self-pay | Admitting: Physical Therapy

## 2021-11-07 ENCOUNTER — Ambulatory Visit: Payer: Medicare Other | Admitting: Occupational Therapy

## 2021-11-07 ENCOUNTER — Ambulatory Visit: Payer: Medicare Other

## 2021-11-07 DIAGNOSIS — M25611 Stiffness of right shoulder, not elsewhere classified: Secondary | ICD-10-CM

## 2021-11-07 DIAGNOSIS — R2681 Unsteadiness on feet: Secondary | ICD-10-CM | POA: Diagnosis not present

## 2021-11-07 DIAGNOSIS — R471 Dysarthria and anarthria: Secondary | ICD-10-CM

## 2021-11-07 DIAGNOSIS — R2689 Other abnormalities of gait and mobility: Secondary | ICD-10-CM

## 2021-11-07 DIAGNOSIS — R41844 Frontal lobe and executive function deficit: Secondary | ICD-10-CM

## 2021-11-07 DIAGNOSIS — R29898 Other symptoms and signs involving the musculoskeletal system: Secondary | ICD-10-CM | POA: Diagnosis not present

## 2021-11-07 DIAGNOSIS — R41840 Attention and concentration deficit: Secondary | ICD-10-CM

## 2021-11-07 DIAGNOSIS — R29818 Other symptoms and signs involving the nervous system: Secondary | ICD-10-CM | POA: Diagnosis not present

## 2021-11-07 DIAGNOSIS — R293 Abnormal posture: Secondary | ICD-10-CM

## 2021-11-07 DIAGNOSIS — R278 Other lack of coordination: Secondary | ICD-10-CM | POA: Diagnosis not present

## 2021-11-07 DIAGNOSIS — Z9181 History of falling: Secondary | ICD-10-CM

## 2021-11-07 DIAGNOSIS — R41841 Cognitive communication deficit: Secondary | ICD-10-CM

## 2021-11-07 NOTE — Therapy (Signed)
OUTPATIENT OCCUPATIONAL THERAPY NEURO Treatment  Patient Name: Ronald Noble MRN: [id number] DOB:October 03, 1945, 76 y.o., male Today's Date: 11/07/2021  PCP: Dr. [name] REFERRING PROVIDER: Dr. Star Age   OT End of Session - 11/07/21 0811     Visit Number 4    Number of Visits 17    Date for OT Re-Evaluation 01/18/22    Authorization Time Period cert period 09/08/76-4/69/62    Authorization - Visit Number 4    Authorization - Number of Visits 10    Progress Note Due on Visit 10    OT Start Time 0804    OT Stop Time 0843    OT Time Calculation (min) 39 min              Past Medical History:  Diagnosis Date   BPH (benign prostatic hyperplasia)    COVID 04/24/2020   scratchy throat x 1 day all symptoms resolved   Parkinson disease ([place])    Skin cancer of face    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   Sleep apnea    mild no cpap used   Urethral stricture    Past Surgical History:  Procedure Laterality Date   BURR HOLE W/ STEREOTACTIC INSERTION OF DBS LEADS / INTRAOP MICROELECTRODE RECORDING  04/2014   [facility] in [facility]   COLONOSCOPY  2019   [place] N/A 06/15/2019   Procedure: CYSTOSCOPY WITH URETHRAL DILATATION;  Surgeon: Franchot Gallo, MD;  Location: [place];  Service: Urology;  Laterality: N/A;  [place] N/A 05/09/2020   Procedure: CYSTOSCOPY WITH BALLOON DILATATION OF POSTERIOR URETHRAL STRICTURE, FLUOROSCOPIC INTERPRETATION;  Surgeon: Franchot Gallo, MD;  Location: [facility];  Service: Urology;  Laterality: N/A;   KNEE SURGERY  yrs ago   exploratory/left   SKIN CANCER EXCISION  01/2014   face   TRANSURETHRAL RESECTION OF PROSTATE     2008   Patient Active Problem List   Diagnosis Date Noted   Rib pain on right side 12/10/2020   Hyperlipidemia 10/11/2020   Bilateral olecranon bursitis 07/05/2019   Other bursitis of  knee, left knee 12/06/2018   Pain in right leg 12/06/2018   Parkinson disease ([place])     ONSET DATE: 10/02/21  (referral date, recommended from previous therapy d/c)  REFERRING DIAG: Parkinson's Disease   THERAPY DIAG:  Unsteadiness on feet  Other abnormalities of gait and mobility  Other symptoms and signs involving the nervous system  Abnormal posture  Other lack of coordination  Other symptoms and signs involving the musculoskeletal system  Stiffness of right shoulder, not elsewhere classified  Attention and concentration deficit  Frontal lobe and executive function deficit  Rationale for Evaluation and Treatment Rehabilitation  SUBJECTIVE:   SUBJECTIVE STATEMENT: Pt reports MD told him just to keep an eye on tick bite site Goes by"[name]"  Pt accompanied by: self  PERTINENT HISTORY: Parkinson's Disease s/p bilateral DBS  PMH:  hx of skin CA, sleep apnea, bilateral DBS   PRECAUTIONS: Fall and Other: Bilateral DBS  WEIGHT BEARING RESTRICTIONS No  PAIN:  Are you having pain? No  FALLS: Has patient fallen in last 6 months? Yes. Number of falls 2 with ED visits and stumbles/multiple other falls  (1-2x/month)  LIVING ENVIRONMENT: Lives with: lives with their spouse  PLOF: Independent with basic ADLs, Vocation/Vocational requirements: retired in March 2023, and Leisure: [employer] 2x/wk, stretching/yoga (and therapy exercises), walk (  range is limited approx 2 blocks--uses hiking poles)  PATIENT GOALS improve coordination, ADLs  OBJECTIVE:         OBSERVATIONS: Pt reports facial dystonias associated with medications which make it difficult to communicate and difficult to keep eyes open.  Pt with hx of dystonia/dyskinesias depending on medication cycle.  Bradykinesia noted, impulsivity noted at times.   TODAY'S TREATMENT:  Prone PWR! Up, rock and twist, mod v.c for performance and amplitude, -did not issue pt would need review Supine closed chain  shoulder flexion and chest press with paper towel roll, min-mod v.c Sitting, dynamic twist and reach with RUE to place graded clothespins on vertical antennae, min v.c for upright posture, and amplitude   PATIENT EDUCATION: Education details: closed chain shoulder flexion and chest press Person educated: Patient Education method: Explanation, demonstration, verbal cues, handout Education comprehension: verbalized understanding, returned demonstration.   HOME EXERCISE PROGRAM:  Supine closed chain shoulder flexion and chest press with paper towel roll, min-mod v.c GOALS: Potential Goals reviewed with patient? Yes  SHORT TERM GOALS: Target date: 11/20/21  Pt will be independent be updated PD-specific HEP. Goal status: INITIAL  2.  Pt will demo at least 135* R shoulder flex for functional reaching  Baseline:  125* Goal status: INITIAL  3.  Pt will write at least 4 sentences with good size/legibility. Goal status: INITIAL  4.  Pt will improve L hand coordination for ADLs as shown by improving time on 9-hole peg test by at least 5sec. Baseline:  47.09sec Goal status: INITIAL  5.  Pt will verbalize understanding of memory/cognitive compensation strategies. Goal status: INITIAL   LONG TERM GOALS: Target date: 01/19/22  Pt will be independent with strategies/AE for ADLs/IADLs.(Including strategy for donning/ doffing jacket) Goal status: INITIAL  2.  Pt will improve L hand coordination for ADLs as shown by improving time on 9-hole peg test by at least 8sec. Baseline: 47.09sec Goal status: INITIAL  3.  Pt will improve bilateral functional reaching/coordination as shown by improving score on box and blocks test by at 4 blocks with LUE. Baseline: L-44 blocks Goal status: INITIAL  4.  Pt will improve bilateral functional reaching/coordination as shown by improving score on box and blocks test by at 4 blocks with RUE. Baseline:  R-47 blocks Goal status: INITIAL  5.  Pt will  increase bilateral staGoal status: INITIAL  6.  Pt will demonstrate improved ease with fastening buttons as evidenced by decreasing 3 button/ unbutton time to :49 secs or less.  Baseline: 53.46 Goal status: INITIAL     ASSESSMENT:  CLINICAL IMPRESSION: Pt is progressing towards goals. He demonstrates understanding of beginning coordination HEP. Pt will benefit from reinforcement of upright posture and avoiding right lateral lean.  PERFORMANCE DEFICITS in functional skills including ADLs, IADLs, coordination, dexterity, tone, ROM, flexibility, FMC, GMC, mobility, balance, body mechanics, endurance, decreased knowledge of precautions, decreased knowledge of use of DME, and UE functional use, cognitive skills including attention, memory, perception, and safety awareness, and psychosocial skills including environmental adaptation and habits.   IMPAIRMENTS are limiting patient from ADLs, IADLs, and leisure.   COMORBIDITIES may have co-morbidities  that affects occupational performance. Patient will benefit from skilled OT to address above impairments and improve overall function.  MODIFICATION OR ASSISTANCE TO COMPLETE EVALUATION: Min-Moderate modification of tasks or assist with assess necessary to complete an evaluation.  OT OCCUPATIONAL PROFILE AND HISTORY: Detailed assessment: Review of records and additional review of physical, cognitive, psychosocial history related to current functional  performance.  CLINICAL DECISION MAKING: Moderate - several treatment options, min-mod task modification necessary  REHAB POTENTIAL: Good  EVALUATION COMPLEXITY: Moderate    PLAN: OT FREQUENCY: 2x/week  OT DURATION: 8 weeks +eval or 17 visits over 12 weeks for scheduling  PLANNED INTERVENTIONS: self care/ADL training, therapeutic exercise, therapeutic activity, neuromuscular re-education, manual therapy, passive range of motion, balance training, functional mobility training, aquatic therapy,  splinting, ultrasound, paraffin, fluidotherapy, moist heat, cryotherapy, patient/family education, cognitive remediation/compensation, energy conservation, and DME and/or AE instructions  RECOMMENDED OTHER SERVICES: current with PT and ST (evals today)  CONSULTED AND AGREED WITH PLAN OF CARE: Patient  PLAN FOR NEXT SESSION: issue closed-chain shoulder flex HEP, ADL strategies   Tanya Crothers, OTR/L 11/07/2021, 8:17 AM Theone Murdoch, OTR/L [phone] Phone: [phone] 8:17 AM 11/07/21

## 2021-11-07 NOTE — Therapy (Signed)
OUTPATIENT PHYSICAL THERAPY NEURO TREATMENT   Patient Name: Ronald Noble MRN: [id number] DOB:Dec 03, 1945, 76 y.o., male Today's Date: 11/07/2021   PCP: [name], MD REFERRING PROVIDER: Star Age, MD   PT End of Session - 11/07/21 0848     Visit Number 4    Number of Visits 17    Date for PT Re-Evaluation 01/18/22   Due to delay in scheduling   Authorization Type Medicare & BCBS    PT Start Time [phone]    PT Stop Time 0928    PT Time Calculation (min) 41 min    Equipment Utilized During Treatment Gait belt    Activity Tolerance Patient tolerated treatment well    Behavior During Therapy [facility] for tasks assessed/performed;Impulsive              Past Medical History:  Diagnosis Date   BPH (benign prostatic hyperplasia)    COVID 04/24/2020   scratchy throat x 1 day all symptoms resolved   Parkinson disease ([place])    Skin cancer of face    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   Sleep apnea    mild no cpap used   Urethral stricture    Past Surgical History:  Procedure Laterality Date   BURR HOLE W/ STEREOTACTIC INSERTION OF DBS LEADS / INTRAOP MICROELECTRODE RECORDING  04/2014   [facility] in [facility]   COLONOSCOPY  2019   [place] N/A 06/15/2019   Procedure: CYSTOSCOPY WITH URETHRAL DILATATION;  Surgeon: Franchot Gallo, MD;  Location: [facility];  Service: Urology;  Laterality: N/A;  [place] N/A 05/09/2020   Procedure: CYSTOSCOPY WITH BALLOON DILATATION OF POSTERIOR URETHRAL STRICTURE, FLUOROSCOPIC INTERPRETATION;  Surgeon: Franchot Gallo, MD;  Location: [facility];  Service: Urology;  Laterality: N/A;   KNEE SURGERY  yrs ago   exploratory/left   SKIN CANCER EXCISION  01/2014   face   TRANSURETHRAL RESECTION OF PROSTATE     2008   Patient Active Problem List   Diagnosis Date Noted   Rib pain on right side 12/10/2020    Hyperlipidemia 10/11/2020   Bilateral olecranon bursitis 07/05/2019   Other bursitis of knee, left knee 12/06/2018   Pain in right leg 12/06/2018   Parkinson disease ([place])     ONSET DATE: 10/02/2021 (date of referral)  REFERRING DIAG: G20 (ICD-10-CM) - Parkinson's disease ([place])  THERAPY DIAG:  Unsteadiness on feet  Other abnormalities of gait and mobility  Abnormal posture  History of falling  Rationale for Evaluation and Treatment Rehabilitation  SUBJECTIVE:  SUBJECTIVE STATEMENT: No changes, got the tic taken out of his arm. No falls.   Pt accompanied by: self  PERTINENT HISTORY: PD 2008, bilateral DBS (2018); hyperlipidemia   Pt with facial dystonias/dyskinesias with his medications that make it difficult to communicate and keep his eyes open (happens approx. 45 minutes after taking meds)  PAIN:  Are you having pain? No  PRECAUTIONS: Fall Bilateral DBS, No driving, freezing/festination episodes  FALLS: Has patient fallen in last 6 months? Yes. Number of falls Yes. Number of falls 2 with ED visits and stumbles/multiple other falls  (1-2x/month)   PLOF: Independent with basic ADLs, Independent with household mobility without device, and Independent with community mobility with device Independent with basic ADLs, Vocation/Vocational requirements: retired in March 2023, and Leisure: [employer] 2x/wk, stretching/yoga (and therapy exercises), walk (range is limited approx 2 blocks--uses hiking poles)  PATIENT GOALS Wants to work on slowing down.   OBJECTIVE:   COGNITION: Overall cognitive status: Impaired   TODAY'S TREATMENT:  NMR  Sit to stands throughout session with focus on proper technique and taking his time before standing, big focus on wider BOS, tucking feet under and  incr forward lean with tall posture in standing and midline orientation x10 reps, plus additional reps throughout session.   Pt performs PWR! Moves in standing position x 20 reps - with chair in front as needed for balance and mirror for visual cue for orientation to midline as pt with tendency to lean to the R. Initial focus on wider BOS and slowing pace down when performing.    PWR! Up for improved posture  PWR! Rock for improved weighshifting - cued to look up at hands  PWR! Twist for improved trunk rotation - cued to reset in the middle with tall posture before twisting to the other side.   PWR! Step for improved step initiation - focus on improved step clearance   Cues provided for larger amplitude movement patterns. Pt did well with slowing down pace when performing.      GAIT: Gait pattern: step through pattern, decreased arm swing- Right, decreased arm swing- Left, decreased stride length, lateral lean- Right, decreased trunk rotation, trunk flexed, and narrow BOS Distance walked: 545' x 1 indoors with bilat walking poles, 230' x 1 with U-Step Rollator  Assistive device utilized: Single point cane into session, U-Step Rollator during session.  Level of assistance: CGA and Supervision.  Comments: Pt comes in during session holding his [place] and ambulates out of session with SPC.   Worked on gait with bilat walking poles (as pt uses these for ambulation outdoors for exercise), PT adjusted to proper height. Pt initially ambulating with too narrow of a BOS and having poles closer to body and getting off sequence/also with decr RLE foot clearance. Focused on posture, widening BOS, and slowing down pace. Pt demonstrating improved technique/sequencing and balance when slowing down and focusing on incr his stride length with foot clearance. Pt also able to verbalize improvements when he focuses on slowing down his pace. When pt had to ambulate in a more crowded spot in the gym, did demo decr  step length and more shuffled steps, but was able to recover when it a more open area of the gym.    With U-Step Rollator ambulated x230' with initial cues for stride length and stepping underneath the seat. Worked on ambulating between cones, in a figure 8 pattern, cued for wider BOS and staying closer to rollator initially, pt with improved BOS with  incr reps. Perfomed x6 reps.      PATIENT EDUCATION: Education details: Filling out form for U-Step Rollator to go forwards with process of obtaining one (wrote this on paper as a reminder for home to show to his wife). Person educated: Patient Education method: Theatre stage manager Education comprehension: verbalized understanding   HOME EXERCISE PROGRAM: From previous bout of therapy: Seated and standing PWR Up, Quadruped PWR moves on floor, [place]  Access Code: [email or web address] URL: [email or web address] Date: 10/27/2021 Prepared by: Mickie Bail Plaster  Exercises - Standing on one leg in corner   - 1 x daily - 7 x weekly - 1 sets - 3 reps - 30-45 second hold - Sit to stand with band pull-apart   - 1 x daily - 7 x weekly - 3 sets - 10 reps    GOALS: Goals reviewed with patient? Yes  SHORT TERM GOALS: Target date: 11/18/2021  MiniBEST to finish being assessed with LTG written. Baseline: 20/28 Goal status: MET  2.  Pt will perform at least 8 of 10 reps, minimal to no UE support, with NO retropulsion or posterior lean for imrpoved trasnfer safety and efficiency.  Baseline:  Goal status: INITIAL   LONG TERM GOALS: Target date: 12/16/2021  Pt will be independent with final HEP for improved strength, balance, transfers, and gait.   Baseline:  Goal status: INITIAL  2.  Patient will verbalize fall prevention strategies in the home, including tips to help with festination episodes in order to decrease risk of future falls.  Baseline:  Goal status: INITIAL  3. Pt will improve MiniBest to 24/28 for decreased fall risk and  improvement with compensatory stepping strategies.   Baseline: 20/28 Goal status: REVISED  4.  Pt will demonstrate appropriate use of LRAD for indoor and household activities, to assist with gait safety, at least 200 ft with supervision.  Baseline:  Goal status: INITIAL  5.  Pt will perform at least 10 reps with minimal to no UE support from lower/soft surfaces with supervision and no instances of retropulsion in order to demo improvement getting up from the couch. Baseline:  Goal status: INITIAL   ASSESSMENT:  CLINICAL IMPRESSION: Worked on standing PWR moves for balance/larger amplitude movement patterns. Utilized a Geologist, engineering as a visual cue throughout for midline orientation and posture, pt responded well to this. Worked on gait training with bilat walking poles, with focus on slowing down pace and wider BOS. Pt able to demo improved stride length from this and verbalized understanding of importance of slowing down. Continued to work on [employer] with pt demonstrating no festination episodes during session. Will continue to progress towards LTGs.    PERSONAL FACTORS Behavior pattern, Past/current experiences, Time since onset of injury/illness/exacerbation, and 3+ comorbidities: PD 2008, bilateral DBS (2018); hyperlipidemia, dystonia/dyskinesias  are also affecting patient's functional outcome.    PLAN: PT FREQUENCY: 2x/week  PT DURATION: 12 weeks  PLANNED INTERVENTIONS: Therapeutic exercises, Therapeutic activity, Neuromuscular re-education, Balance training, Gait training, Patient/Family education, Self Care, Stair training, and DME instructions  PLAN FOR NEXT SESSION:  Work with both bilat walking poles, try outside if weather permits.   Give pt new band for HEP (he left it on mat). Work on sit <> stands with incr forward lean. Stepping/foot clearance tasks. Work on posture and midline orientation. Wider BOS.   Arliss Journey, PT, DPT 11/07/2021, 9:58 AM

## 2021-11-07 NOTE — Patient Instructions (Signed)
SHOULDER: Flexion Bilateral    Copyright  VHI. All rights reserved.  Shoulder: Flexion (Supine)    Lie on back holding wand. Raise arms over head. Hold 5sec. Repeat 10 times per set.  Do 1 sessions per day.     Press-Up With Wand   Press wand up until elbows are straight, then reach wand over head to a pain free range. Hold 5 seconds. Repeat 10 times. Do 1 sessions per day.

## 2021-11-07 NOTE — Therapy (Signed)
OUTPATIENT SPEECH LANGUAGE PATHOLOGY TREATMENT NOTE   Patient Name: Ronald Noble MRN: [id number] DOB:[birth year], 76 y.o., male Today's Date: 11/07/2021  PCP: [name], MD REFERRING PROVIDER: Star Age, MD  END OF SESSION:   End of Session - 11/07/21 0937     Visit Number 4    Number of Visits 17    Date for SLP Re-Evaluation 12/19/21    Authorization Type Medicare/BCBS    SLP Start Time 0933    SLP Stop Time  1324    SLP Time Calculation (min) 42 min    Activity Tolerance Patient tolerated treatment well               Past Medical History:  Diagnosis Date   BPH (benign prostatic hyperplasia)    COVID 04/24/2020   scratchy throat x 1 day all symptoms resolved   Parkinson disease ([place])    Skin cancer of face    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   Sleep apnea    mild no cpap used   Urethral stricture    Past Surgical History:  Procedure Laterality Date   BURR HOLE W/ STEREOTACTIC INSERTION OF DBS LEADS / INTRAOP MICROELECTRODE RECORDING  04/2014   [facility] in 2020   COLONOSCOPY  2019   CYSTOSCOPY WITH URETHRAL DILATATION N/A 06/15/2019   Procedure: CYSTOSCOPY WITH URETHRAL DILATATION;  Surgeon: Franchot Gallo, MD;  Location: [facility];  Service: Urology;  Laterality: N/A;  [place] N/A 05/09/2020   Procedure: CYSTOSCOPY WITH BALLOON DILATATION OF POSTERIOR URETHRAL STRICTURE, FLUOROSCOPIC INTERPRETATION;  Surgeon: Franchot Gallo, MD;  Location: [facility];  Service: Urology;  Laterality: N/A;   KNEE SURGERY  yrs ago   exploratory/left   SKIN CANCER EXCISION  01/2014   face   TRANSURETHRAL RESECTION OF PROSTATE     2008   Patient Active Problem List   Diagnosis Date Noted   Rib pain on right side 12/10/2020   Hyperlipidemia 10/11/2020   Bilateral olecranon bursitis 07/05/2019   Other bursitis of knee, left knee 12/06/2018   Pain in  right leg 12/06/2018   Parkinson disease ([place])     ONSET DATE: PD dx 2008; 10/02/2021 (referral)  REFERRING DIAG: (ICD-10-CM) - Dysarthria and anarthria R26.81  THERAPY DIAG:  Dysarthria and anarthria  Cognitive communication deficit  Rationale for Evaluation and Treatment Rehabilitation  SUBJECTIVE: "My voice tends to be flat and monotone"  PAIN:  Are you having pain? No   OBJECTIVE:  TODAY'S TREATMENT:  11-07-21: Discussed principle of intent and importance of practicing intent for all opportunities when speaking, walking, writing, etc. Targeted improving vocal quality and increasing intensity through progressively difficulty speech tasks using Speak Out! program, lesson 3. ST leads pt through exercises providing occasional model prior to pt execution. Occasional min-A required to achieve target dB this date. Averages this date: loud "ah" 94 dB; counting 82 dB, reading 77 dB; cognitive speech task 76 dB. Conversational sample of approx 10 minutes, pt averages 70 dB with occasional min-A to carryover intentional speech and achieve targeted conversational volume.   11-05-21: SLP assisted pt is signing up for [place] webinar and showed pt where he can finding the SLP led home practice videos to aid in completion of HEP. Target improving vocal quality and increasing intensity through progressively difficulty speech tasks using Speak Out! program, lesson 3. ST leads pt through exercises providing usual model prior to pt  execution. occasional min-A required to achieve target dB this date. Averages this date: loud "ah" 91 dB; counting 83 dB, reading 75 dB; cognitive speech task 73 dB. required this date during structured practice.  Conversational sample of approx 10 minutes, pt averages 68 dB with usual min-A.   10-27-21: Educated patient on Speak Out! Program and principles to target intentional speech. Targeted improving vocal quality and increasing intensity through  progressively difficulty speech tasks using Speak Out! program, lesson 1. ST leads pt through exercises providing usual model prior to pt execution. Usual min-A required to achieve target dB this date. Averages this date: loud "ah" 92 dB; reading 81 dB; cognitive speech task 77 dB. Conversational sample of approx 5 minutes, pt averages 70 dB with occasional min-A to maintain targeted loudness (70-72 dB) due to volume decay. Some confusion reported re: when to complete Speak Out! Lessons with handout provided to assist with comprehension and carryover.   PATIENT EDUCATION: Education details: see above Person educated: Patient Education method: Explanation, Demonstration, and Handouts Education comprehension: verbalized understanding, returned demonstration, and needs further education     HOME EXERCISE PROGRAM: Speak Out!     GOALS: Goals reviewed with patient? Yes   SHORT TERM GOALS: Target date: 11/17/2021     Pt will complete Speak Out! HEP at least 1x/day given occasional min A over 2 sessions  Baseline: Goal status: ongoing   2.  Pt will achieve targeted dB (85-90 dB) on warm up exercises with 80% accuracy given occasional min A over 2 sessions  Baseline:  11-07-21 Goal status: ongoing   3.  Pt will achieve targeted dB (75-85 dB) on reading exercises with 80% accuracy given occasional min A over 2 sessions Baseline: 11-07-21 Goal status: ongoing   4.  Pt will achieve targeted dB (72-78 dB) on cognitive exercises with 80% accuracy given occasional min A over 2 sessions Baseline: 11-07-21 Goal status: ongoing   5.  Pt will utilize dysarthria compensations in 5-10 minute conversation to optimize vocal intensity and clarity given occasional min A over 2 sessions  Baseline: 11-07-21 Goal status: ongoing   6.  Pt will utilize internal/external memory and attention aids to optimize medication management and completion of daily tasks given occasional min A over 2 sessions  Baseline:  Goal  status: ongoing   LONG TERM GOALS: Target date: 12/19/2021     Pt will complete Speak Out! HEP at least 1x/day (BID recommended) > 1 week  Baseline:  Goal status: ongoing   2.  Pt will achieve targeted dB levels in demonstration of Speak Out! Lessons with 90% accuracy given rare min A over 2 sessions  Baseline:  Goal status: ongoing   3.  Pt will utilize dysarthria compensations in 15+ minute conversation to optimize vocal intensity and clarity given rare min A over 2 sessions Baseline:  Goal status: ongoing   4.  Pt will utilize internal/external memory and attention aids to optimize medication management and completion of daily tasks given rare min A over 2 sessions  Baseline:  Goal status: ongoing   5.  Pt will report improved cognitive communication via PROM by 2 point improvements by last ST session  Baseline: CF=84; CPIB=5 Goal status: ongoing       ASSESSMENT:   CLINICAL IMPRESSION: "[name]" is a 76 y.o. male who was seen today for dysarthria and cognitive changes related to progressive Parkinson's Disease. Conducted ongoing education and instruction of Speak Out! Program and principles to maximize vocal intensity and clarity  secondary to PD related speech changes. Pt benefited from repetition of information to aid recall and accurate completion. Given change in cognition and decline in speech intelligibility, skilled ST intervention is recommended to optimize communication effectiveness as well as increase functional independence and safety through use of cognitive compensatory training.    OBJECTIVE IMPAIRMENTS  Objective impairments include attention, memory, executive functioning, and dysarthria. These impairments are limiting patient from managing medications, household responsibilities, and effectively communicating at home and in community.Factors affecting potential to achieve goals and functional outcome are ability to learn/carryover information,  cooperation/participation level, and medical prognosis. Patient will benefit from skilled SLP services to address above impairments and improve overall function.   REHAB POTENTIAL: Fair - hx of reduced HEP compliance    PLAN: SLP FREQUENCY: 2x/week   SLP DURATION: 8 weeks   PLANNED INTERVENTIONS: Cueing hierachy, Cognitive reorganization, Internal/external aids, Functional tasks, Multimodal communication approach, SLP instruction and feedback, Compensatory strategies, and Patient/family education   Marzetta Board, CCC-SLP 11/07/2021, 10:23 AM

## 2021-11-10 ENCOUNTER — Ambulatory Visit: Payer: Medicare Other | Admitting: Occupational Therapy

## 2021-11-10 ENCOUNTER — Encounter: Payer: Self-pay | Admitting: Physical Therapy

## 2021-11-10 ENCOUNTER — Ambulatory Visit: Payer: Medicare Other

## 2021-11-10 ENCOUNTER — Ambulatory Visit: Payer: Medicare Other | Admitting: Physical Therapy

## 2021-11-10 DIAGNOSIS — Z9181 History of falling: Secondary | ICD-10-CM

## 2021-11-10 DIAGNOSIS — R2681 Unsteadiness on feet: Secondary | ICD-10-CM

## 2021-11-10 DIAGNOSIS — R293 Abnormal posture: Secondary | ICD-10-CM

## 2021-11-10 DIAGNOSIS — R29898 Other symptoms and signs involving the musculoskeletal system: Secondary | ICD-10-CM

## 2021-11-10 DIAGNOSIS — R2689 Other abnormalities of gait and mobility: Secondary | ICD-10-CM

## 2021-11-10 DIAGNOSIS — L089 Local infection of the skin and subcutaneous tissue, unspecified: Secondary | ICD-10-CM | POA: Diagnosis not present

## 2021-11-10 DIAGNOSIS — I83029 Varicose veins of left lower extremity with ulcer of unspecified site: Secondary | ICD-10-CM | POA: Diagnosis not present

## 2021-11-10 DIAGNOSIS — L97929 Non-pressure chronic ulcer of unspecified part of left lower leg with unspecified severity: Secondary | ICD-10-CM | POA: Diagnosis not present

## 2021-11-10 DIAGNOSIS — R471 Dysarthria and anarthria: Secondary | ICD-10-CM

## 2021-11-10 DIAGNOSIS — R278 Other lack of coordination: Secondary | ICD-10-CM

## 2021-11-10 DIAGNOSIS — S81812S Laceration without foreign body, left lower leg, sequela: Secondary | ICD-10-CM | POA: Diagnosis not present

## 2021-11-10 DIAGNOSIS — R41840 Attention and concentration deficit: Secondary | ICD-10-CM

## 2021-11-10 DIAGNOSIS — R29818 Other symptoms and signs involving the nervous system: Secondary | ICD-10-CM | POA: Diagnosis not present

## 2021-11-10 DIAGNOSIS — M25611 Stiffness of right shoulder, not elsewhere classified: Secondary | ICD-10-CM

## 2021-11-10 DIAGNOSIS — R41841 Cognitive communication deficit: Secondary | ICD-10-CM

## 2021-11-10 NOTE — Therapy (Signed)
OUTPATIENT SPEECH LANGUAGE PATHOLOGY TREATMENT NOTE   Patient Name: Ronald Noble MRN: [id number] DOB:04-11-1945, 76 y.o., male Today's Date: 11/10/2021  PCP: [name], MD REFERRING PROVIDER: Star Age, MD  END OF SESSION:   End of Session - 11/10/21 0853     Visit Number 5    Number of Visits 17    Date for SLP Re-Evaluation 12/19/21    Authorization Type Medicare/BCBS    SLP Start Time 1020    SLP Stop Time  1100    SLP Time Calculation (min) 40 min    Activity Tolerance Patient tolerated treatment well                Past Medical History:  Diagnosis Date   BPH (benign prostatic hyperplasia)    COVID 04/24/2020   scratchy throat x 1 day all symptoms resolved   Parkinson disease ([place])    Skin cancer of face    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   Sleep apnea    mild no cpap used   Urethral stricture    Past Surgical History:  Procedure Laterality Date   BURR HOLE W/ STEREOTACTIC INSERTION OF DBS LEADS / INTRAOP MICROELECTRODE RECORDING  04/2014   [facility] in [facility]   COLONOSCOPY  2019   CYSTOSCOPY WITH URETHRAL DILATATION N/A 06/15/2019   Procedure: CYSTOSCOPY WITH URETHRAL DILATATION;  Surgeon: Franchot Gallo, MD;  Location[facility];  Service: Urology;  Laterality: N/A;  [place] N/A 05/09/2020   Procedure: CYSTOSCOPY WITH BALLOON DILATATION OF POSTERIOR URETHRAL STRICTURE, FLUOROSCOPIC INTERPRETATION;  Surgeon: Franchot Gallo, MD;  Location: [facility];  Service: Urology;  Laterality: N/A;   KNEE SURGERY  yrs ago   exploratory/left   SKIN CANCER EXCISION  01/2014   face   TRANSURETHRAL RESECTION OF PROSTATE     2008   Patient Active Problem List   Diagnosis Date Noted   Rib pain on right side 12/10/2020   Hyperlipidemia 10/11/2020   Bilateral olecranon bursitis 07/05/2019   Other bursitis of knee, left knee 12/06/2018   Pain in  right leg 12/06/2018   Parkinson disease ([place])     ONSET DATE: PD dx 2008; 10/02/2021 (referral)  REFERRING DIAG: (ICD-10-CM) - Dysarthria and anarthria R26.81  THERAPY DIAG:  Dysarthria and anarthria  Cognitive communication deficit  Rationale for Evaluation and Treatment Rehabilitation  SUBJECTIVE: "I'm doing fine"   PAIN:  Are you having pain? No   OBJECTIVE:  TODAY'S TREATMENT:  11-10-21: Inconsistent carryover of HEP reported since last ST session (only completed x1 over weekend). SLP educated and wrote out recommended schedule to aid recall and carryover of targeted dysarthria strategies. Targeted improving vocal quality and increasing intensity through progressively difficulty speech tasks using Speak Out! program, lesson 4. ST leads pt through exercises providing occasional model prior to pt execution. Occasional min-A required to achieve target dB this date. Averages this date: loud "ah" 93 dB; counting 83 dB, reading 82 dB; cognitive speech task 74 dB. Side comments and conversational sample of approx 10 minutes, pt averages 71 dB in quiet environment, with usual fading to occasional min-A to carryover intentional speech and achieve targeted conversational volume. Educated and instructed throat clear alternatives with usual cues required to use throat clear alternative given frequent throat clearing.   11-07-21: Discussed principle of intent and importance of practicing intent for all opportunities when speaking, walking, writing, etc. Targeted improving vocal quality  and increasing intensity through progressively difficulty speech tasks using Speak Out! program, lesson 3. ST leads pt through exercises providing occasional model prior to pt execution. Occasional min-A required to achieve target dB this date. Averages this date: loud "ah" 94 dB; counting 82 dB, reading 77 dB; cognitive speech task 76 dB. Conversational sample of approx 10 minutes, pt averages 70 dB with occasional min-A  to carryover intentional speech and achieve targeted conversational volume.   11-05-21: SLP assisted pt is signing up for [place] webinar and showed pt where he can finding the SLP led home practice videos to aid in completion of HEP. Target improving vocal quality and increasing intensity through progressively difficulty speech tasks using Speak Out! program, lesson 3. ST leads pt through exercises providing usual model prior to pt execution. occasional min-A required to achieve target dB this date. Averages this date: loud "ah" 91 dB; counting 83 dB, reading 75 dB; cognitive speech task 73 dB. required this date during structured practice.  Conversational sample of approx 10 minutes, pt averages 68 dB with usual min-A.   10-27-21: Educated patient on Speak Out! Program and principles to target intentional speech. Targeted improving vocal quality and increasing intensity through progressively difficulty speech tasks using Speak Out! program, lesson 1. ST leads pt through exercises providing usual model prior to pt execution. Usual min-A required to achieve target dB this date. Averages this date: loud "ah" 92 dB; reading 81 dB; cognitive speech task 77 dB. Conversational sample of approx 5 minutes, pt averages 70 dB with occasional min-A to maintain targeted loudness (70-72 dB) due to volume decay. Some confusion reported re: when to complete Speak Out! Lessons with handout provided to assist with comprehension and carryover.   PATIENT EDUCATION: Education details: see above Person educated: Patient Education method: Explanation, Demonstration, and Handouts Education comprehension: verbalized understanding, returned demonstration, and needs further education     HOME EXERCISE PROGRAM: Speak Out!     GOALS: Goals reviewed with patient? Yes   SHORT TERM GOALS: Target date: 11/17/2021     Pt will complete Speak Out! HEP at least 1x/day given occasional min A over 2 sessions   Baseline: Goal status: ongoing   2.  Pt will achieve targeted dB (85-90 dB) on warm up exercises with 80% accuracy given occasional min A over 2 sessions  Baseline:  11-07-21 Goal status: ongoing   3.  Pt will achieve targeted dB (75-85 dB) on reading exercises with 80% accuracy given occasional min A over 2 sessions Baseline: 11-07-21 Goal status: ongoing   4.  Pt will achieve targeted dB (72-78 dB) on cognitive exercises with 80% accuracy given occasional min A over 2 sessions Baseline: 11-07-21 Goal status: ongoing   5.  Pt will utilize dysarthria compensations in 5-10 minute conversation to optimize vocal intensity and clarity given occasional min A over 2 sessions  Baseline: 11-07-21 Goal status: ongoing   6.  Pt will utilize internal/external memory and attention aids to optimize medication management and completion of daily tasks given occasional min A over 2 sessions  Baseline:  Goal status: ongoing   LONG TERM GOALS: Target date: 12/19/2021     Pt will complete Speak Out! HEP at least 1x/day (BID recommended) > 1 week  Baseline:  Goal status: ongoing   2.  Pt will achieve targeted dB levels in demonstration of Speak Out! Lessons with 90% accuracy given rare min A over 2 sessions  Baseline:  Goal status: ongoing   3.  Pt will utilize dysarthria compensations in 15+ minute conversation to optimize vocal intensity and clarity given rare min A over 2 sessions Baseline:  Goal status: ongoing   4.  Pt will utilize internal/external memory and attention aids to optimize medication management and completion of daily tasks given rare min A over 2 sessions  Baseline:  Goal status: ongoing   5.  Pt will report improved cognitive communication via PROM by 2 point improvements by last ST session  Baseline: CF=84; CPIB=5 Goal status: ongoing       ASSESSMENT:   CLINICAL IMPRESSION: "[name]" is a 76 y.o. male who was seen today for dysarthria and cognitive changes related to  progressive Parkinson's Disease. Conducted ongoing education and instruction of Speak Out! Program and principles to maximize vocal intensity and clarity secondary to PD related speech changes. Pt benefited from repetition of information to aid recall and accurate completion. Given change in cognition and decline in speech intelligibility, skilled ST intervention is recommended to optimize communication effectiveness as well as increase functional independence and safety through use of cognitive compensatory training.    OBJECTIVE IMPAIRMENTS  Objective impairments include attention, memory, executive functioning, and dysarthria. These impairments are limiting patient from managing medications, household responsibilities, and effectively communicating at home and in community.Factors affecting potential to achieve goals and functional outcome are ability to learn/carryover information, cooperation/participation level, and medical prognosis. Patient will benefit from skilled SLP services to address above impairments and improve overall function.   REHAB POTENTIAL: Fair - hx of reduced HEP compliance    PLAN: SLP FREQUENCY: 2x/week   SLP DURATION: 8 weeks   PLANNED INTERVENTIONS: Cueing hierachy, Cognitive reorganization, Internal/external aids, Functional tasks, Multimodal communication approach, SLP instruction and feedback, Compensatory strategies, and Patient/family education   Marzetta Board, CCC-SLP 11/10/2021, 10:55 AM

## 2021-11-10 NOTE — Therapy (Signed)
OUTPATIENT OCCUPATIONAL THERAPY NEURO Treatment  Patient Name: Ronald Noble MRN: [id number] DOB:1946-02-01, 76 y.o., male Today's Date: 11/10/2021  PCP: Dr. [name] REFERRING PROVIDER: Dr. Star Age   OT End of Session - 11/10/21 0909     Visit Number 5    Number of Visits 17    Date for OT Re-Evaluation 01/18/22    Authorization Type Medicare and BCBS--covered 100%    Authorization Time Period cert period 4/0/98-04/24/12    Authorization - Visit Number 5    Authorization - Number of Visits 10    Progress Note Due on Visit 10    OT Start Time 0848    OT Stop Time 0930    OT Time Calculation (min) 42 min    Activity Tolerance Patient tolerated treatment well    Behavior During Therapy [facility] for tasks assessed/performed;Impulsive              Past Medical History:  Diagnosis Date   BPH (benign prostatic hyperplasia)    COVID 04/24/2020   scratchy throat x 1 day all symptoms resolved   Parkinson disease ([place])    Skin cancer of face    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   Sleep apnea    mild no cpap used   Urethral stricture    Past Surgical History:  Procedure Laterality Date   BURR HOLE W/ STEREOTACTIC INSERTION OF DBS LEADS / INTRAOP MICROELECTRODE RECORDING  04/2014   [facility] in [facility]   COLONOSCOPY  2019   [place] N/A 06/15/2019   Procedure: CYSTOSCOPY WITH URETHRAL DILATATION;  Surgeon: Franchot Gallo, MD;  Location: [facility];  Service: Urology;  Laterality: N/A;  [place] N/A 05/09/2020   Procedure: CYSTOSCOPY WITH BALLOON DILATATION OF POSTERIOR URETHRAL STRICTURE, FLUOROSCOPIC INTERPRETATION;  Surgeon: Franchot Gallo, MD;  Location: [place];  Service: Urology;  Laterality: N/A;   KNEE SURGERY  yrs ago   exploratory/left   SKIN CANCER EXCISION  01/2014   face   TRANSURETHRAL RESECTION OF PROSTATE     2008    Patient Active Problem List   Diagnosis Date Noted   Rib pain on right side 12/10/2020   Hyperlipidemia 10/11/2020   Bilateral olecranon bursitis 07/05/2019   Other bursitis of knee, left knee 12/06/2018   Pain in right leg 12/06/2018   Parkinson disease ([place])     ONSET DATE: 10/02/21  (referral date, recommended from previous therapy d/c)  REFERRING DIAG: Parkinson's Disease   THERAPY DIAG:  Unsteadiness on feet  Other abnormalities of gait and mobility  Abnormal posture  Other symptoms and signs involving the nervous system  Other lack of coordination  Other symptoms and signs involving the musculoskeletal system  Stiffness of right shoulder, not elsewhere classified  Attention and concentration deficit  Rationale for Evaluation and Treatment Rehabilitation  SUBJECTIVE:   SUBJECTIVE STATEMENT: Pt reports "nothing new" Goes by"Ronald Noble"  Pt accompanied by: self  PERTINENT HISTORY: Parkinson's Disease s/p bilateral DBS  PMH:  hx of skin CA, sleep apnea, bilateral DBS   PRECAUTIONS: Fall and Other: Bilateral DBS  WEIGHT BEARING RESTRICTIONS No  PAIN:  Are you having pain? No  FALLS: Has patient fallen in last 6 months? Yes. Number of falls 2 with ED visits and stumbles/multiple other falls  (1-2x/month)  LIVING ENVIRONMENT: Lives with: lives with their spouse  PLOF: Independent with basic ADLs, Vocation/Vocational requirements: retired in March  2023, and Leisure: [employer] 2x/wk, stretching/yoga (and therapy exercises), walk (range is limited approx 2 blocks--uses hiking poles)  PATIENT GOALS improve coordination, ADLs  OBJECTIVE:         OBSERVATIONS: Pt reports facial dystonias associated with medications which make it difficult to communicate and difficult to keep eyes open.  Pt with hx of dystonia/dyskinesias depending on medication cycle.  Bradykinesia noted, impulsivity noted at times.   TODAY'S TREATMENT:  Modified quadraped PWR! 10  reps each, min-mod v.c and demonstration. Pt was educated in safety for standing to quadraped using chair in front for safety, min v.c and demonstation Pt practiced safety for sit to stand form chir with pt donning his bag prior to standing then scooting forward, then leaning forward to stand and pausing after standing to get his balance, min v.c and demonstration for reinforcement as pt had a LOB in waiting room. Handwriting strategies, with practice and handout issued. Therapsist reviwed handout with patient. He demonstrates improved legibility and good letter size with practice and cueing.  PATIENT EDUCATION: Education details:PWR! Moves modified quadraped, 10 reps each, min-mod v.c, safety and techniques for sit-stand from floor, handwriting strategies/ handout issued Person educated: Patient Education method: Explanation, demonstration, verbal cues, handout for handwriting, did not issue handout for PWR! Moves modified quadraped due to need for reinforcement Education comprehension: verbalized understanding, returned demonstration.   HOME EXERCISE PROGRAM:  Supine closed chain shoulder flexion and chest press with paper towel roll, min-mod v.c GOALS: Potential Goals reviewed with patient? Yes  SHORT TERM GOALS: Target date: 11/20/21  Pt will be independent be updated PD-specific HEP. Goal status: INITIAL  2.  Pt will demo at least 135* R shoulder flex for functional reaching  Baseline:  125* Goal status: INITIAL  3.  Pt will write at least 4 sentences with good size/legibility. Goal status: INITIAL  4.  Pt will improve L hand coordination for ADLs as shown by improving time on 9-hole peg test by at least 5sec. Baseline:  47.09sec Goal status: INITIAL  5.  Pt will verbalize understanding of memory/cognitive compensation strategies. Goal status: INITIAL   LONG TERM GOALS: Target date: 01/19/22  Pt will be independent with strategies/AE for ADLs/IADLs.(Including strategy for  donning/ doffing jacket) Goal status: INITIAL  2.  Pt will improve L hand coordination for ADLs as shown by improving time on 9-hole peg test by at least 8sec. Baseline: 47.09sec Goal status: INITIAL  3.  Pt will improve bilateral functional reaching/coordination as shown by improving score on box and blocks test by at 4 blocks with LUE. Baseline: L-44 blocks Goal status: INITIAL  4.  Pt will improve bilateral functional reaching/coordination as shown by improving score on box and blocks test by at 4 blocks with RUE. Baseline:  R-47 blocks Goal status: INITIAL  5.  Pt will increase bilateral staGoal status: INITIAL  6.  Pt will demonstrate improved ease with fastening buttons as evidenced by decreasing 3 button/ unbutton time to :49 secs or less.  Baseline: 53.46 Goal status: INITIAL     ASSESSMENT:  CLINICAL IMPRESSION: Pt is progressing towards goals. He demonstrates improving handwriting following practice.  PERFORMANCE DEFICITS in functional skills including ADLs, IADLs, coordination, dexterity, tone, ROM, flexibility, FMC, GMC, mobility, balance, body mechanics, endurance, decreased knowledge of precautions, decreased knowledge of use of DME, and UE functional use, cognitive skills including attention, memory, perception, and safety awareness, and psychosocial skills including environmental adaptation and habits.   IMPAIRMENTS are limiting patient from ADLs, IADLs,  and leisure.   COMORBIDITIES may have co-morbidities  that affects occupational performance. Patient will benefit from skilled OT to address above impairments and improve overall function.  MODIFICATION OR ASSISTANCE TO COMPLETE EVALUATION: Min-Moderate modification of tasks or assist with assess necessary to complete an evaluation.  OT OCCUPATIONAL PROFILE AND HISTORY: Detailed assessment: Review of records and additional review of physical, cognitive, psychosocial history related to current functional  performance.  CLINICAL DECISION MAKING: Moderate - several treatment options, min-mod task modification necessary  REHAB POTENTIAL: Good  EVALUATION COMPLEXITY: Moderate    PLAN: OT FREQUENCY: 2x/week  OT DURATION: 8 weeks +eval or 17 visits over 12 weeks for scheduling  PLANNED INTERVENTIONS: self care/ADL training, therapeutic exercise, therapeutic activity, neuromuscular re-education, manual therapy, passive range of motion, balance training, functional mobility training, aquatic therapy, splinting, ultrasound, paraffin, fluidotherapy, moist heat, cryotherapy, patient/family education, cognitive remediation/compensation, energy conservation, and DME and/or AE instructions  RECOMMENDED OTHER SERVICES: current with PT and ST (evals today)  CONSULTED AND AGREED WITH PLAN OF CARE: Patient  PLAN FOR NEXT SESSION: review modified quadraped PWR!,  safety for sit to stand,ADL strategies   Berneda Piccininni, OTR/L 11/10/2021, 9:10 AM Theone Murdoch, OTR/L [phone] Phone: [phone] 9:10 AM 11/10/21

## 2021-11-10 NOTE — Therapy (Signed)
OUTPATIENT PHYSICAL THERAPY NEURO TREATMENT   Patient Name: Ronald Noble MRN: [id number] DOB:1945-04-18, 76 y.o., male Today's Date: 11/10/2021   PCP: [name], MD REFERRING PROVIDER: Star Age, MD   PT End of Session - 11/10/21 0933     Visit Number 5    Number of Visits 17    Date for PT Re-Evaluation 01/18/22   Due to delay in scheduling   Authorization Type Medicare & BCBS    PT Start Time 0932    PT Stop Time 1015    PT Time Calculation (min) 43 min    Equipment Utilized During Treatment Gait belt    Activity Tolerance Patient tolerated treatment well    Behavior During Therapy [facility] for tasks assessed/performed;Impulsive               Past Medical History:  Diagnosis Date   BPH (benign prostatic hyperplasia)    COVID 04/24/2020   scratchy throat x 1 day all symptoms resolved   Parkinson disease ([place])    Skin cancer of face    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   Sleep apnea    mild no cpap used   Urethral stricture    Past Surgical History:  Procedure Laterality Date   BURR HOLE W/ STEREOTACTIC INSERTION OF DBS LEADS / INTRAOP MICROELECTRODE RECORDING  04/2014   [facility] in [facility]   COLONOSCOPY  2019   [place] N/A 06/15/2019   Procedure: CYSTOSCOPY WITH URETHRAL DILATATION;  Surgeon: Franchot Gallo, MD;  [facility];  Service: Urology;  Laterality: N/A;  [place] N/A 05/09/2020   Procedure: CYSTOSCOPY WITH BALLOON DILATATION OF POSTERIOR URETHRAL STRICTURE, FLUOROSCOPIC INTERPRETATION;  Surgeon: Franchot Gallo, MD;  Location: [facility];  Service: Urology;  Laterality: N/A;   KNEE SURGERY  yrs ago   exploratory/left   SKIN CANCER EXCISION  01/2014   face   TRANSURETHRAL RESECTION OF PROSTATE     2008   Patient Active Problem List   Diagnosis Date Noted   Rib pain on right side 12/10/2020    Hyperlipidemia 10/11/2020   Bilateral olecranon bursitis 07/05/2019   Other bursitis of knee, left knee 12/06/2018   Pain in right leg 12/06/2018   Parkinson disease ([place])     ONSET DATE: 10/02/2021 (date of referral)  REFERRING DIAG: G20 (ICD-10-CM) - Parkinson's disease ([place])  THERAPY DIAG:  Unsteadiness on feet  Other abnormalities of gait and mobility  History of falling  Abnormal posture  Rationale for Evaluation and Treatment Rehabilitation  SUBJECTIVE:  SUBJECTIVE STATEMENT: Spent the weekend with his grandson, no falls.  Pt accompanied by: self  PERTINENT HISTORY: PD 2008, bilateral DBS (2018); hyperlipidemia   Pt with facial dystonias/dyskinesias with his medications that make it difficult to communicate and keep his eyes open (happens approx. 45 minutes after taking meds)  PAIN:  Are you having pain? No  PRECAUTIONS: Fall Bilateral DBS, No driving, freezing/festination episodes  FALLS: Has patient fallen in last 6 months? Yes. Number of falls Yes. Number of falls 2 with ED visits and stumbles/multiple other falls  (1-2x/month)   PLOF: Independent with basic ADLs, Independent with household mobility without device, and Independent with community mobility with device Independent with basic ADLs, Vocation/Vocational requirements: retired in March 2023, and Leisure: [employer] 2x/wk, stretching/yoga (and therapy exercises), walk (range is limited approx 2 blocks--uses hiking poles)  PATIENT GOALS Wants to work on slowing down.   OBJECTIVE:   COGNITION: Overall cognitive status: Impaired   TODAY'S TREATMENT:  NMR  Worked on sit to stands from lower surface with focus on making sure pt scooted out to the edge each time, on wider BOS, tucking feet under and incr forward lean  with tall posture in standing and midline orientation x10 reps, plus additional reps throughout session from other chair surfaces. Reviewed importance on making sure pt scooted out to the edge each time.   Pt performs PWR! Moves in sitting position x19 reps - with mirror in front as pt for visual cue for posture and midline orientation    PWR! Up for improved posture  PWR! Rock for improved weighshifting - cued to try to kick out leg   PWR! Twist for improved trunk rotation - cued to reset in the middle with tall posture before twisting to the other side.   PWR! Step for improved step initiation - focus on improved step clearance esp with RLE and incr intensity of movement.  Cues provided for larger amplitude movement patterns. Pt did well with slowing down pace when performing. Pt is potentially interested in participating in the [place] moves classes after PT D/C   SLS: -Alternating forward stepping over smaller orange obstacle x10 reps each leg, then performed over tall over orange obstacle x10 reps each leg, performed without UE support. Intermittent min guard, needing cues to focus on RLE foot clearance  -Alternating forward gentle toe taps to 2 cones x10 reps each side, then progressing to alternating cross body taps x10 reps each side slowly for controlled SLS  -Alternating lateral stepping over smaller orange obstacle x10 reps each side, cued to focus on tall posture in the midline each time.   Cued for wider BOS when bringing foot back to midline for improved balance when weight shifting.    GAIT: Gait pattern: step through pattern, decreased arm swing- Right, decreased arm swing- Left, decreased stride length, lateral lean- Right, decreased trunk rotation, trunk flexed, and narrow BOS Distance walked: 230' indoors and 1,000' outdoors with bilat walking poles  Assistive device utilized: Single point cane into session, U-Step Rollator during session.  Level of assistance: Supervision.   Comments: Pt comes in during session holding his [facility] and ambulates out of session with SPC.   Worked on gait with bilat walking poles (as pt uses these for ambulation outdoors for exercise) initially indoors for 2 laps and then outdoors. Initial cues for wider BOS, stride length, foot clearance (esp RLE), and slowing down pace. When outdoors, pt needing cues for incr amplitude of movement when bringing poles forwards  as this helped pt slow down pace and incr stride length. Intermittent cues to slow down pace for improved gait mechanics. Pt overall did well with poles outside.     PATIENT EDUCATION: Education details: Continue HEP.  Person educated: Patient Education method: Explanation Education comprehension: verbalized understanding   HOME EXERCISE PROGRAM: From previous bout of therapy: Seated and standing PWR Up, Quadruped PWR moves on floor, [email or web address]  Access Code: [email or web address] URL: [email or web address] Date: 10/27/2021 Prepared by: Mickie Bail Plaster  Exercises - Standing on one leg in corner   - 1 x daily - 7 x weekly - 1 sets - 3 reps - 30-45 second hold - Sit to stand with band pull-apart   - 1 x daily - 7 x weekly - 3 sets - 10 reps    GOALS: Goals reviewed with patient? Yes  SHORT TERM GOALS: Target date: 11/18/2021  MiniBEST to finish being assessed with LTG written. Baseline: 20/28 Goal status: MET  2.  Pt will perform at least 8 of 10 reps, minimal to no UE support, with NO retropulsion or posterior lean for imrpoved trasnfer safety and efficiency.  Baseline:  Goal status: INITIAL   LONG TERM GOALS: Target date: 12/16/2021  Pt will be independent with final HEP for improved strength, balance, transfers, and gait.   Baseline:  Goal status: INITIAL  2.  Patient will verbalize fall prevention strategies in the home, including tips to help with festination episodes in order to decrease risk of future falls.  Baseline:  Goal status: INITIAL  3. Pt  will improve MiniBest to 24/28 for decreased fall risk and improvement with compensatory stepping strategies.   Baseline: 20/28 Goal status: REVISED  4.  Pt will demonstrate appropriate use of LRAD for indoor and household activities, to assist with gait safety, at least 200 ft with supervision.  Baseline:  Goal status: INITIAL  5.  Pt will perform at least 10 reps with minimal to no UE support from lower/soft surfaces with supervision and no instances of retropulsion in order to demo improvement getting up from the couch. Baseline:  Goal status: INITIAL   ASSESSMENT:  CLINICAL IMPRESSION: Worked on gait training outdoors with bilat walking poles with focus on slowing down pace, stride length, and foot clearance. Pt overall did well, and responded well to incr amplitude of movement when bringing poles forwards as this helped pt take longer steps. Pt also able to verbalize that he can tell the difference when he focuses on this and is able to slow down his pace. Worked on balance training with SLS and foot clearance tasks over obstacles with pt able to perform the majority without UE support. Will continue to progress towards LTGs.    PERSONAL FACTORS Behavior pattern, Past/current experiences, Time since onset of injury/illness/exacerbation, and 3+ comorbidities: PD 2008, bilateral DBS (2018); hyperlipidemia, dystonia/dyskinesias  are also affecting patient's functional outcome.    PLAN: PT FREQUENCY: 2x/week  PT DURATION: 12 weeks  PLANNED INTERVENTIONS: Therapeutic exercises, Therapeutic activity, Neuromuscular re-education, Balance training, Gait training, Patient/Family education, Self Care, Stair training, and DME instructions  PLAN FOR NEXT SESSION:  Pt wants to work on sit <> stands from squisher surfaces (like getting up from his couch). May bring in a photo of his couch to next session to figure out proper mechanics when performing.   Work on sit <> stands with incr forward  lean. Stepping/foot clearance tasks. SLS for balance. Unlevel surfaces. Work on posture and midline orientation. Wider BOS.   Montray Kliebert N  Jowanda Heeg, PT, DPT 11/10/2021, 4:38 PM

## 2021-11-10 NOTE — Patient Instructions (Addendum)
Learn about Parkinson's Psychologist, forensic): Tomorrow, August 8 at 11:30 AM   11/10/21:   Lesson #4 AM (completed in [place]) [place] #4 PM   11/11/21: Ronald Noble #5 AM Lesson #5 PM  11/12/21:  Ronald Noble #6 AM Lesson #6 PM  Considerations:  Be intentional/effortful for [facility]  Think about extending your vowels and having inflection Do not rush through these exercises - be INTENTIONAL with your breathing  Bring a water bottle with you so you can stay on top of your hydration  Do not clear your throat - instead, SWALLOW HARD or take a sip of wtaer

## 2021-11-12 ENCOUNTER — Ambulatory Visit: Payer: Medicare Other

## 2021-11-12 ENCOUNTER — Ambulatory Visit: Payer: Medicare Other | Admitting: Occupational Therapy

## 2021-11-12 ENCOUNTER — Ambulatory Visit: Payer: Medicare Other | Admitting: Physical Therapy

## 2021-11-12 DIAGNOSIS — R293 Abnormal posture: Secondary | ICD-10-CM

## 2021-11-12 DIAGNOSIS — M25611 Stiffness of right shoulder, not elsewhere classified: Secondary | ICD-10-CM

## 2021-11-12 DIAGNOSIS — R278 Other lack of coordination: Secondary | ICD-10-CM

## 2021-11-12 DIAGNOSIS — R29818 Other symptoms and signs involving the nervous system: Secondary | ICD-10-CM | POA: Diagnosis not present

## 2021-11-12 DIAGNOSIS — R471 Dysarthria and anarthria: Secondary | ICD-10-CM | POA: Diagnosis not present

## 2021-11-12 DIAGNOSIS — R2689 Other abnormalities of gait and mobility: Secondary | ICD-10-CM | POA: Diagnosis not present

## 2021-11-12 DIAGNOSIS — R29898 Other symptoms and signs involving the musculoskeletal system: Secondary | ICD-10-CM

## 2021-11-12 DIAGNOSIS — R2681 Unsteadiness on feet: Secondary | ICD-10-CM

## 2021-11-12 DIAGNOSIS — R41840 Attention and concentration deficit: Secondary | ICD-10-CM

## 2021-11-12 DIAGNOSIS — R41841 Cognitive communication deficit: Secondary | ICD-10-CM

## 2021-11-12 NOTE — Therapy (Signed)
OUTPATIENT PHYSICAL THERAPY NEURO TREATMENT   Patient Name: Ronald Noble MRN: [id number] DOB:Aug 14, 1945, 76 y.o., male Today's Date: 11/12/2021   PCP: [name], MD REFERRING PROVIDER: Star Age, MD   PT End of Session - 11/12/21 1021     Visit Number 6    Number of Visits 17    Date for PT Re-Evaluation 01/18/22   Due to delay in scheduling   Authorization Type Medicare & BCBS    PT Start Time 1018    PT Stop Time 1101    PT Time Calculation (min) 43 min    Equipment Utilized During Treatment --    Activity Tolerance Patient tolerated treatment well    Behavior During Therapy [facility] for tasks assessed/performed;Impulsive                Past Medical History:  Diagnosis Date   BPH (benign prostatic hyperplasia)    COVID 04/24/2020   scratchy throat x 1 day all symptoms resolved   Parkinson disease ([place])    Skin cancer of face    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   Sleep apnea    mild no cpap used   Urethral stricture    Past Surgical History:  Procedure Laterality Date   BURR HOLE W/ STEREOTACTIC INSERTION OF DBS LEADS / INTRAOP MICROELECTRODE RECORDING  04/2014   [facility]   COLONOSCOPY  2019   [place] N/A 06/15/2019   Procedure: CYSTOSCOPY WITH URETHRAL DILATATION;  Surgeon: Franchot Gallo, MD;  Location: [facility]  Service: Urology;  Laterality: N/A;  [place] N/A 05/09/2020   Procedure: CYSTOSCOPY WITH BALLOON DILATATION OF POSTERIOR URETHRAL STRICTURE, FLUOROSCOPIC INTERPRETATION;  Surgeon: Franchot Gallo, MD;  Location: [facility];  Service: Urology;  Laterality: N/A;   KNEE SURGERY  yrs ago   exploratory/left   SKIN CANCER EXCISION  01/2014   face   TRANSURETHRAL RESECTION OF PROSTATE     2008   Patient Active Problem List   Diagnosis Date Noted   Rib pain on right side 12/10/2020    Hyperlipidemia 10/11/2020   Bilateral olecranon bursitis 07/05/2019   Other bursitis of knee, left knee 12/06/2018   Pain in right leg 12/06/2018   Parkinson disease ([place])     ONSET DATE: 10/02/2021 (date of referral)  REFERRING DIAG: G20 (ICD-10-CM) - Parkinson's disease ([place])  THERAPY DIAG:  Unsteadiness on feet  Other abnormalities of gait and mobility  Abnormal posture  Rationale for Evaluation and Treatment Rehabilitation  SUBJECTIVE:  SUBJECTIVE STATEMENT: Still wearing bandage on L lateral shin due to cut from several weeks ago that still has not healed, which he feels is limiting him w/PT. No new falls.   Pt accompanied by: self  PERTINENT HISTORY: PD 2008, bilateral DBS (2018); hyperlipidemia   Pt with facial dystonias/dyskinesias with his medications that make it difficult to communicate and keep his eyes open (happens approx. 45 minutes after taking meds)  PAIN:  Are you having pain? No, but reports soreness up posterior neck/occiput where DBS line is   PRECAUTIONS: Fall Bilateral DBS, No driving, freezing/festination episodes  FALLS: Has patient fallen in last 6 months? Yes. Number of falls Yes. Number of falls 2 with ED visits and stumbles/multiple other falls  (1-2x/month)   PLOF: Independent with basic ADLs, Independent with household mobility without device, and Independent with community mobility with device Independent with basic ADLs, Vocation/Vocational requirements: retired in March 2023, and Leisure: [employer] 2x/wk, stretching/yoga (and therapy exercises), walk (range is limited approx 2 blocks--uses hiking poles)  PATIENT GOALS Wants to work on slowing down.   OBJECTIVE:   COGNITION: Overall cognitive status: Impaired   TODAY'S TREATMENT:  NMR  -Sit <>  stands w/PWR up as warmup and neural priming for anterior weight shift and upright posture, x10 reps.  -Progressed to sit <>stands w/blue wedge under B feet to bias posterior lean without UE support to imitate coming to stand from couch at home, x30-40 reps as pt frequently unable to achieve full hip extension at top due to retropulsion. Noted poor eccentric control during stand <>sit throughout, max multimodal cues provided to maintain weight through entirety of foot and not just heel as well improved amplitude of anterior weight shift.  -Alt fwd eccentric heel taps from 4" box w/BUE support for improved quad strength to translate to controlled stand <>sit and for single leg stability. Pt reported height being too easy, so progressed to 6" box w/unilateral UE support, x20 per side.   Ther Ex  SciFit multi-peaks level 10 for 8 minutes using BUE/BLEs for neural priming for reciprocal movement, dynamic cardiovascular conditioning and increased amplitude of stepping. RPE of 5/10 following activity.     GAIT: Gait pattern: step through pattern, decreased arm swing- Right, decreased arm swing- Left, decreased stride length, lateral lean- Right, decreased trunk rotation, trunk flexed, and narrow BOS Distance walked: Various clinic distances  Assistive device utilized: Single point cane  Level of assistance: Supervision.  Comments: Pt comes in during session holding his SPC and ambulates out of session holding SPC.    PATIENT EDUCATION: Education details: Continue HEP.  Person educated: Patient Education method: Explanation Education comprehension: verbalized understanding   HOME EXERCISE PROGRAM: From previous bout of therapy: Seated and standing PWR Up, Quadruped PWR moves on floor, [email or web address]  Access Code: [email or web address] URL: [email or web address] Date: 10/27/2021 Prepared by: Mickie Bail Plaster  Exercises - Standing on one leg in corner   - 1 x daily - 7 x weekly - 1 sets - 3 reps -  30-45 second hold - Sit to stand with band pull-apart   - 1 x daily - 7 x weekly - 3 sets - 10 reps    GOALS: Goals reviewed with patient? Yes  SHORT TERM GOALS: Target date: 11/18/2021  MiniBEST to finish being assessed with LTG written. Baseline: 20/28 Goal status: MET  2.  Pt will perform at least 8 of 10 reps, minimal to no UE support, with NO retropulsion or posterior lean for imrpoved trasnfer safety  and efficiency.  Baseline:  Goal status: INITIAL   LONG TERM GOALS: Target date: 12/16/2021  Pt will be independent with final HEP for improved strength, balance, transfers, and gait.   Baseline:  Goal status: INITIAL  2.  Patient will verbalize fall prevention strategies in the home, including tips to help with festination episodes in order to decrease risk of future falls.  Baseline:  Goal status: INITIAL  3. Pt will improve MiniBest to 24/28 for decreased fall risk and improvement with compensatory stepping strategies.   Baseline: 20/28 Goal status: REVISED  4.  Pt will demonstrate appropriate use of LRAD for indoor and household activities, to assist with gait safety, at least 200 ft with supervision.  Baseline:  Goal status: INITIAL  5.  Pt will perform at least 10 reps with minimal to no UE support from lower/soft surfaces with supervision and no instances of retropulsion in order to demo improvement getting up from the couch. Baseline:  Goal status: INITIAL   ASSESSMENT:  CLINICAL IMPRESSION: Session limited due to impaired vision 2/2 dyskinesia. Emphasis of skilled PT session on continued sit <>stand practice w/posterior lean bias to imitate pt coming to stand from plush couch at home. Pt able to come to standing well but had significant difficulty stabilizing at top of rep even w/max cues. Pt demonstrates significant difficulty controlling decent to sit, so remainder of session spent on eccentric quad strength to promote safety and control w/decent. Continue  POC.    PERSONAL FACTORS Behavior pattern, Past/current experiences, Time since onset of injury/illness/exacerbation, and 3+ comorbidities: PD 2008, bilateral DBS (2018); hyperlipidemia, dystonia/dyskinesias  are also affecting patient's functional outcome.    PLAN: PT FREQUENCY: 2x/week  PT DURATION: 12 weeks  PLANNED INTERVENTIONS: Therapeutic exercises, Therapeutic activity, Neuromuscular re-education, Balance training, Gait training, Patient/Family education, Self Care, Stair training, and DME instructions  PLAN FOR NEXT SESSION:  Pt wants to work on sit <> stands from squisher surfaces (like getting up from his couch). May bring in a photo of his couch to next session to figure out proper mechanics when performing.   Work on sit <> stands with incr forward lean. Stepping/foot clearance tasks. SLS for balance. Unlevel surfaces. Work on posture and midline orientation. Wider BOS.   Cruzita Lederer Holmes Hays, PT, DPT 11/12/2021, 11:02 AM

## 2021-11-12 NOTE — Therapy (Signed)
OUTPATIENT OCCUPATIONAL THERAPY NEURO Treatment  Patient Name: Ronald Noble MRN: [id number] DOB:1946-01-27, 76 y.o., male Today's Date: 11/12/2021  PCP: Dr. Crist Noble REFERRING PROVIDER: Dr. Star Noble   OT End of Session - 11/12/21 0856     Visit Number 6    Number of Visits 17    Date for OT Re-Evaluation 01/18/22    Authorization Type Medicare and BCBS--covered 100%    Authorization Time Period cert period 10/10/80-07/28/51    Authorization - Visit Number 6    Authorization - Number of Visits 10    Progress Note Due on Visit 10    OT Start Time 0846    OT Stop Time 0926    OT Time Calculation (min) 40 min    Activity Tolerance Patient tolerated treatment well    Behavior During Therapy [facility] for tasks assessed/performed;Impulsive              Past Medical History:  Diagnosis Date   BPH (benign prostatic hyperplasia)    COVID 04/24/2020   scratchy throat x 1 day all symptoms resolved   Parkinson disease ([place])    Skin cancer of face    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   Sleep apnea    mild no cpap used   Urethral stricture    Past Surgical History:  Procedure Laterality Date   BURR HOLE W/ STEREOTACTIC INSERTION OF DBS LEADS / INTRAOP MICROELECTRODE RECORDING  04/2014   [facility] in [facility]   COLONOSCOPY  2019   [place] N/A 06/15/2019   Procedure: CYSTOSCOPY WITH URETHRAL DILATATION;  Surgeon: Ronald Gallo, MD;  Location: [facility];  Service: Urology;  Laterality: N/A;  [place] N/A 05/09/2020   Procedure: CYSTOSCOPY WITH BALLOON DILATATION OF POSTERIOR URETHRAL STRICTURE, FLUOROSCOPIC INTERPRETATION;  Surgeon: Ronald Gallo, MD;  Location: [facility];  Service: Urology;  Laterality: N/A;   KNEE SURGERY  yrs ago   exploratory/left   SKIN CANCER EXCISION  01/2014   face   TRANSURETHRAL RESECTION OF PROSTATE     2008    Patient Active Problem List   Diagnosis Date Noted   Rib pain on right side 12/10/2020   Hyperlipidemia 10/11/2020   Bilateral olecranon bursitis 07/05/2019   Other bursitis of knee, left knee 12/06/2018   Pain in right leg 12/06/2018   Parkinson disease ([place])     ONSET DATE: 10/02/21  (referral date, recommended from previous therapy d/c)  REFERRING DIAG: Parkinson's Disease   THERAPY DIAG:  No diagnosis found.  Rationale for Evaluation and Treatment Rehabilitation  SUBJECTIVE:   SUBJECTIVE STATEMENT: Pt reports "nothing new" Goes by"Ronald Noble"  Pt accompanied by: self  PERTINENT HISTORY: Parkinson's Disease s/p bilateral DBS  PMH:  hx of skin CA, sleep apnea, bilateral DBS   PRECAUTIONS: Fall and Other: Bilateral DBS  WEIGHT BEARING RESTRICTIONS No  PAIN:  Are you having pain? No  FALLS: Has patient fallen in last 6 months? Yes. Number of falls 2 with ED visits and stumbles/multiple other falls  (1-2x/month)  LIVING ENVIRONMENT: Lives with: lives with their spouse  PLOF: Independent with basic ADLs, Vocation/Vocational requirements: retired in March 2023, and Leisure: [employer] 2x/wk, stretching/yoga (and therapy exercises), walk (range is limited approx 2 blocks--uses hiking poles)  PATIENT GOALS improve coordination, ADLs  OBJECTIVE:         OBSERVATIONS: Pt reports facial dystonias associated with medications which make it  difficult to communicate and difficult to keep eyes open.  Pt with hx of dystonia/dyskinesias depending on medication cycle.  Bradykinesia noted, impulsivity noted at times.   TODAY'S TREATMENT:   Reviewed recommendation that pt donns his bag, then scoots forward, then stands, min v.c  Supine PWR! 10 reps each, min v.c  and demonstration, for amplitude, bridge separate from steppping, min v.c , pt demonstrates improved performance Closed chain chest press and shoulder flexion in supine, min v.c for positioning Techniques for  sit-stand and donning bag while sitting prior to stand  for safety. Pt has been practicing handwriting, he demonstrates improved legibility and letter size now. Dynamic step and reach to left and right sizes elevated surface to flip playing cards , min v.c for finger extension. PWR! Hands PWR! Up and step, min v.c Fastening and unfastening buttons with larger amplitude movements, min v.c        PATIENT EDUCATION: Education details:PWR! Moves supine 10 reps each, min-mod v.c, safety and techniques for sit-stand and donning bag while sitting for safety. Person educated: Patient Education method: Explanation, demonstration, verbal cues, handout for handwriting, did not issue handout for PWR! Moves modified quadraped due to need for reinforcement Education comprehension: verbalized understanding, returned demonstration.   HOME EXERCISE PROGRAM:  Supine closed chain shoulder flexion and chest press with paper towel roll, min-mod v.c GOALS: Potential Goals reviewed with patient? Yes  SHORT TERM GOALS: Target date: 11/20/21  Pt will be independent be updated PD-specific HEP. Goal status: INITIAL  2.  Pt will demo at least 135* R shoulder flex for functional reaching  Baseline:  125* Goal status: INITIAL  3.  Pt will write at least 4 sentences with good size/legibility. Goal status: INITIAL  4.  Pt will improve L hand coordination for ADLs as shown by improving time on 9-hole peg test by at least 5sec. Baseline:  47.09sec Goal status: INITIAL  5.  Pt will verbalize understanding of memory/cognitive compensation strategies. Goal status: INITIAL   LONG TERM GOALS: Target date: 01/19/22  Pt will be independent with strategies/AE for ADLs/IADLs.(Including strategy for donning/ doffing jacket) Goal status: INITIAL  2.  Pt will improve L hand coordination for ADLs as shown by improving time on 9-hole peg test by at least 8sec. Baseline: 47.09sec Goal status: INITIAL  3.  Pt  will improve bilateral functional reaching/coordination as shown by improving score on box and blocks test by at 4 blocks with LUE. Baseline: L-44 blocks Goal status: INITIAL  4.  Pt will improve bilateral functional reaching/coordination as shown by improving score on box and blocks test by at 4 blocks with RUE. Baseline:  R-47 blocks Goal status: INITIAL  5.  Pt will increase bilateral staGoal status: INITIAL  6.  Pt will demonstrate improved ease with fastening buttons as evidenced by decreasing 3 button/ unbutton time to :49 secs or less.  Baseline: 53.46 Goal status: INITIAL     ASSESSMENT:  CLINICAL IMPRESSION: Pt is progressing towards goals. He demonstrates improved performance of PWR! Moves in supine following review.  PERFORMANCE DEFICITS in functional skills including ADLs, IADLs, coordination, dexterity, tone, ROM, flexibility, FMC, GMC, mobility, balance, body mechanics, endurance, decreased knowledge of precautions, decreased knowledge of use of DME, and UE functional use, cognitive skills including attention, memory, perception, and safety awareness, and psychosocial skills including environmental adaptation and habits.   IMPAIRMENTS are limiting patient from ADLs, IADLs, and leisure.   COMORBIDITIES may have co-morbidities  that affects occupational performance. Patient will benefit from  skilled OT to address above impairments and improve overall function.  MODIFICATION OR ASSISTANCE TO COMPLETE EVALUATION: Min-Moderate modification of tasks or assist with assess necessary to complete an evaluation.  OT OCCUPATIONAL PROFILE AND HISTORY: Detailed assessment: Review of records and additional review of physical, cognitive, psychosocial history related to current functional performance.  CLINICAL DECISION MAKING: Moderate - several treatment options, min-mod task modification necessary  REHAB POTENTIAL: Good  EVALUATION COMPLEXITY: Moderate    PLAN: OT FREQUENCY:  2x/week  OT DURATION: 8 weeks +eval or 17 visits over 12 weeks for scheduling  PLANNED INTERVENTIONS: self care/ADL training, therapeutic exercise, therapeutic activity, neuromuscular re-education, manual therapy, passive range of motion, balance training, functional mobility training, aquatic therapy, splinting, ultrasound, paraffin, fluidotherapy, moist heat, cryotherapy, patient/family education, cognitive remediation/compensation, energy conservation, and DME and/or AE instructions  RECOMMENDED OTHER SERVICES: current with PT and ST (evals today)  CONSULTED AND AGREED WITH PLAN OF CARE: Patient  PLAN FOR NEXT SESSION: review modified quadraped PWR!,  safety for sit to stand,ADL strategies, check STG's   Marcey Persad, OTR/L 11/12/2021, 8:57 AM Theone Murdoch, OTR/L [phone] Phone: [phone] 8:57 AM 11/12/21

## 2021-11-12 NOTE — Patient Instructions (Addendum)
Make sure we incorporate Speak Out! Program into routine:  Morning Session: 6-7:30 AM Evening Session: 6-7:30 PM

## 2021-11-12 NOTE — Therapy (Signed)
OUTPATIENT SPEECH LANGUAGE PATHOLOGY TREATMENT NOTE   Patient Name: Ronald Noble MRN: [id number] DOB:[birth year], 76 y.o., male Today's Date: 11/12/2021  PCP: [name], MD REFERRING PROVIDER: Star Age, MD  END OF SESSION:   End of Session - 11/12/21 0930     Visit Number 6    Number of Visits 17    Date for SLP Re-Evaluation 12/19/21    Authorization Type Medicare/BCBS    SLP Start Time 0930    SLP Stop Time  9323    SLP Time Calculation (min) 45 min    Activity Tolerance Patient tolerated treatment well                 Past Medical History:  Diagnosis Date   BPH (benign prostatic hyperplasia)    COVID 04/24/2020   scratchy throat x 1 day all symptoms resolved   Parkinson disease ([place])    Skin cancer of face    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   Sleep apnea    mild no cpap used   Urethral stricture    Past Surgical History:  Procedure Laterality Date   BURR HOLE W/ STEREOTACTIC INSERTION OF DBS LEADS / INTRAOP MICROELECTRODE RECORDING  04/2014   [facility] leads [facility] in 2020   COLONOSCOPY  2019   CYSTOSCOPY WITH URETHRAL DILATATION N/A 06/15/2019   Procedure: CYSTOSCOPY WITH URETHRAL DILATATION;  Surgeon: Franchot Gallo, MD;  Location: [facility];  Service: Urology;  Laterality: N/A;  [place] N/A 05/09/2020   Procedure: CYSTOSCOPY WITH BALLOON DILATATION OF POSTERIOR URETHRAL STRICTURE, FLUOROSCOPIC INTERPRETATION;  Surgeon: Franchot Gallo, MD;  Location: [facility];  Service: Urology;  Laterality: N/A;   KNEE SURGERY  yrs ago   exploratory/left   SKIN CANCER EXCISION  01/2014   face   TRANSURETHRAL RESECTION OF PROSTATE     2008   Patient Active Problem List   Diagnosis Date Noted   Rib pain on right side 12/10/2020   Hyperlipidemia 10/11/2020   Bilateral olecranon bursitis 07/05/2019   Other bursitis of knee, left knee 12/06/2018   Pain in  right leg 12/06/2018   Parkinson disease ([place])     ONSET DATE: PD dx 2008; 10/02/2021 (referral)  REFERRING DIAG: (ICD-10-CM) - Dysarthria and anarthria R26.81  THERAPY DIAG:  Dysarthria and anarthria  Cognitive communication deficit  Rationale for Evaluation and Treatment Rehabilitation  SUBJECTIVE: "I listened to the webinar"  PAIN:  Are you having pain? No   OBJECTIVE:  TODAY'S TREATMENT:  11-12-21:  Entered with instructions from OT to don/doff bag while seated for optimal safety. Usual mod to max A required to complete task as instructed and recall placement of bag. Targeted improving vocal quality and increasing intensity through progressively difficulty speech tasks using Speak Out! program, lesson 5. ST leads pt through exercises providing occasional model prior to pt execution. Occasional min-A required to achieve target dB and slow rate this date. Averages this date: loud "ah" 91 dB; counting 86 dB, reading 83 dB; cognitive speech task 76 dB. Side comments and conversational sample of approx 5 minutes, pt averages 70 dB in quiet environment, with usual min-A to carryover intentional speech and achieve targeted conversational volume. Discussed incorporating daily speech practice into routine as minimal carryover of HEP demonstrated at this time.   11-10-21: Inconsistent carryover of HEP reported since last ST session (only completed x1 over weekend). SLP educated and wrote out recommended schedule to  aid recall and carryover of targeted dysarthria strategies. Targeted improving vocal quality and increasing intensity through progressively difficulty speech tasks using Speak Out! program, lesson 4. ST leads pt through exercises providing occasional model prior to pt execution. Occasional min-A required to achieve target dB this date. Averages this date: loud "ah" 93 dB; counting 83 dB, reading 82 dB; cognitive speech task 74 dB. Side comments and conversational sample of approx 10  minutes, pt averages 71 dB in quiet environment, with usual fading to occasional min-A to carryover intentional speech and achieve targeted conversational volume. Educated and instructed throat clear alternatives with usual cues required to use throat clear alternative given frequent throat clearing.   11-07-21: Discussed principle of intent and importance of practicing intent for all opportunities when speaking, walking, writing, etc. Targeted improving vocal quality and increasing intensity through progressively difficulty speech tasks using Speak Out! program, lesson 3. ST leads pt through exercises providing occasional model prior to pt execution. Occasional min-A required to achieve target dB this date. Averages this date: loud "ah" 94 dB; counting 82 dB, reading 77 dB; cognitive speech task 76 dB. Conversational sample of approx 10 minutes, pt averages 70 dB with occasional min-A to carryover intentional speech and achieve targeted conversational volume.   11-05-21: SLP assisted pt is signing up for [place] webinar and showed pt where he can finding the SLP led home practice videos to aid in completion of HEP. Target improving vocal quality and increasing intensity through progressively difficulty speech tasks using Speak Out! program, lesson 3. ST leads pt through exercises providing usual model prior to pt execution. occasional min-A required to achieve target dB this date. Averages this date: loud "ah" 91 dB; counting 83 dB, reading 75 dB; cognitive speech task 73 dB. required this date during structured practice.  Conversational sample of approx 10 minutes, pt averages 68 dB with usual min-A.   10-27-21: Educated patient on Speak Out! Program and principles to target intentional speech. Targeted improving vocal quality and increasing intensity through progressively difficulty speech tasks using Speak Out! program, lesson 1. ST leads pt through exercises providing usual model prior to pt  execution. Usual min-A required to achieve target dB this date. Averages this date: loud "ah" 92 dB; reading 81 dB; cognitive speech task 77 dB. Conversational sample of approx 5 minutes, pt averages 70 dB with occasional min-A to maintain targeted loudness (70-72 dB) due to volume decay. Some confusion reported re: when to complete Speak Out! Lessons with handout provided to assist with comprehension and carryover.   PATIENT EDUCATION: Education details: see above Person educated: Patient Education method: Explanation, Demonstration, and Handouts Education comprehension: verbalized understanding, returned demonstration, and needs further education     HOME EXERCISE PROGRAM: Speak Out!     GOALS: Goals reviewed with patient? Yes   SHORT TERM GOALS: Target date: 11/17/2021     Pt will complete Speak Out! HEP at least 1x/day given occasional min A over 2 sessions  Baseline: Goal status: ongoing   2.  Pt will achieve targeted dB (85-90 dB) on warm up exercises with 80% accuracy given occasional min A over 2 sessions  Baseline:  11-07-21, 11-12-21 Goal status: Met   3.  Pt will achieve targeted dB (75-85 dB) on reading exercises with 80% accuracy given occasional min A over 2 sessions Baseline: 11-07-21, 11-12-21 Goal status: Met   4.  Pt will achieve targeted dB (72-78 dB) on cognitive exercises with 80% accuracy given occasional min A  over 2 sessions Baseline: 11-07-21, 11-12-21 Goal status: Met   5.  Pt will utilize dysarthria compensations in 5-10 minute conversation to optimize vocal intensity and clarity given occasional min A over 2 sessions  Baseline: 11-07-21 Goal status: ongoing   6.  Pt will utilize internal/external memory and attention aids to optimize medication management and completion of daily tasks given occasional min A over 2 sessions  Baseline:  Goal status: ongoing   LONG TERM GOALS: Target date: 12/19/2021     Pt will complete Speak Out! HEP at least 1x/day (BID  recommended) > 1 week  Baseline:  Goal status: ongoing   2.  Pt will achieve targeted dB levels in demonstration of Speak Out! Lessons with 90% accuracy given rare min A over 2 sessions  Baseline:  Goal status: ongoing   3.  Pt will utilize dysarthria compensations in 15+ minute conversation to optimize vocal intensity and clarity given rare min A over 2 sessions Baseline:  Goal status: ongoing   4.  Pt will utilize internal/external memory and attention aids to optimize medication management and completion of daily tasks given rare min A over 2 sessions  Baseline:  Goal status: ongoing   5.  Pt will report improved cognitive communication via PROM by 2 point improvements by last ST session  Baseline: CF=84; CPIB=5 Goal status: ongoing       ASSESSMENT:   CLINICAL IMPRESSION: "[name]" is a 76 y.o. male who was seen today for dysarthria and cognitive changes related to progressive Parkinson's Disease. Conducted ongoing education and instruction of Speak Out! Program and principles to maximize vocal intensity and clarity secondary to PD related speech changes. Pt benefited from repetition of information to aid recall and accurate completion. Given change in cognition and decline in speech intelligibility, skilled ST intervention is recommended to optimize communication effectiveness as well as increase functional independence and safety through use of cognitive compensatory training.    OBJECTIVE IMPAIRMENTS  Objective impairments include attention, memory, executive functioning, and dysarthria. These impairments are limiting patient from managing medications, household responsibilities, and effectively communicating at home and in community.Factors affecting potential to achieve goals and functional outcome are ability to learn/carryover information, cooperation/participation level, and medical prognosis. Patient will benefit from skilled SLP services to address above impairments and improve  overall function.   REHAB POTENTIAL: Fair - hx of reduced HEP compliance    PLAN: SLP FREQUENCY: 2x/week   SLP DURATION: 8 weeks   PLANNED INTERVENTIONS: Cueing hierachy, Cognitive reorganization, Internal/external aids, Functional tasks, Multimodal communication approach, SLP instruction and feedback, Compensatory strategies, and Patient/family education   Marzetta Board, CCC-SLP 11/12/2021, 9:31 AM

## 2021-11-14 DIAGNOSIS — L570 Actinic keratosis: Secondary | ICD-10-CM | POA: Diagnosis not present

## 2021-11-14 DIAGNOSIS — L98499 Non-pressure chronic ulcer of skin of other sites with unspecified severity: Secondary | ICD-10-CM | POA: Diagnosis not present

## 2021-11-14 DIAGNOSIS — L565 Disseminated superficial actinic porokeratosis (DSAP): Secondary | ICD-10-CM | POA: Diagnosis not present

## 2021-11-14 DIAGNOSIS — B353 Tinea pedis: Secondary | ICD-10-CM | POA: Diagnosis not present

## 2021-11-14 DIAGNOSIS — L814 Other melanin hyperpigmentation: Secondary | ICD-10-CM | POA: Diagnosis not present

## 2021-11-14 DIAGNOSIS — Z85828 Personal history of other malignant neoplasm of skin: Secondary | ICD-10-CM | POA: Diagnosis not present

## 2021-11-17 DIAGNOSIS — I83029 Varicose veins of left lower extremity with ulcer of unspecified site: Secondary | ICD-10-CM | POA: Diagnosis not present

## 2021-11-17 DIAGNOSIS — L089 Local infection of the skin and subcutaneous tissue, unspecified: Secondary | ICD-10-CM | POA: Diagnosis not present

## 2021-11-17 DIAGNOSIS — S81812S Laceration without foreign body, left lower leg, sequela: Secondary | ICD-10-CM | POA: Diagnosis not present

## 2021-11-17 DIAGNOSIS — L97929 Non-pressure chronic ulcer of unspecified part of left lower leg with unspecified severity: Secondary | ICD-10-CM | POA: Diagnosis not present

## 2021-11-17 NOTE — Therapy (Signed)
OUTPATIENT OCCUPATIONAL THERAPY NEURO Treatment  Patient Name: Ronald Noble MRN: [id number] DOB:1945-07-02, 76 y.o., male Today's Date: 11/18/2021  PCP: Dr. [name] REFERRING PROVIDER: Dr. Star Age   OT End of Session - 11/18/21 0846     Visit Number 7    Number of Visits 17    Date for OT Re-Evaluation 01/18/22    Authorization Type Medicare and BCBS--covered 100%    Authorization Time Period cert period 0/2/58-08/30/76    Authorization - Visit Number 7    Authorization - Number of Visits 10    Progress Note Due on Visit 10    OT Start Time 0850    OT Stop Time 0930    OT Time Calculation (min) 40 min    Activity Tolerance Patient tolerated treatment well    Behavior During Therapy [facility] for tasks assessed/performed;Impulsive               Past Medical History:  Diagnosis Date   BPH (benign prostatic hyperplasia)    COVID 04/24/2020   scratchy throat x 1 day all symptoms resolved   Parkinson disease ([place])    Skin cancer of face    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   Sleep apnea    mild no cpap used   Urethral stricture    Past Surgical History:  Procedure Laterality Date   BURR HOLE W/ STEREOTACTIC INSERTION OF DBS LEADS / INTRAOP MICROELECTRODE RECORDING  04/2014   [facility] in [facility]   COLONOSCOPY  2019   [place] N/A 06/15/2019   Procedure: CYSTOSCOPY WITH URETHRAL DILATATION;  Surgeon: Franchot Gallo, MD;  Location: [facility];  Service: Urology;  Laterality: N/A;  [place] N/A 05/09/2020   Procedure: CYSTOSCOPY WITH BALLOON DILATATION OF POSTERIOR URETHRAL STRICTURE, FLUOROSCOPIC INTERPRETATION;  Surgeon: Franchot Gallo, MD;  Location: [facility];  Service: Urology;  Laterality: N/A;   KNEE SURGERY  yrs ago   exploratory/left   SKIN CANCER EXCISION  01/2014   face   TRANSURETHRAL RESECTION OF PROSTATE     2008    Patient Active Problem List   Diagnosis Date Noted   Rib pain on right side 12/10/2020   Hyperlipidemia 10/11/2020   Bilateral olecranon bursitis 07/05/2019   Other bursitis of knee, left knee 12/06/2018   Pain in right leg 12/06/2018   Parkinson disease ([place])     ONSET DATE: 10/02/21  (referral date, recommended from previous therapy d/c)  REFERRING DIAG: Parkinson's Disease   THERAPY DIAG:  Other symptoms and signs involving the nervous system  Other lack of coordination  Other symptoms and signs involving the musculoskeletal system  Stiffness of right shoulder, not elsewhere classified  Attention and concentration deficit  Abnormal posture  Frontal lobe and executive function deficit  Unsteadiness on feet  Rationale for Evaluation and Treatment Rehabilitation  SUBJECTIVE:   SUBJECTIVE STATEMENT:  My Left shoulder hurts when I raise it.    Goes by"Ronald Noble"  Pt accompanied by: self  PERTINENT HISTORY: Parkinson's Disease s/p bilateral DBS  PMH:  hx of skin CA, sleep apnea, bilateral DBS   PRECAUTIONS: Fall and Other: Bilateral DBS  WEIGHT BEARING RESTRICTIONS No  PAIN:  Are you having pain? No and Yes: NPRS scale: 2-3/10 Pain location: L shoulder Pain description: sharp at times Aggravating factors: raising arm Relieving factors: rest  FALLS: Has patient fallen in last 6 months? Yes. Number of falls  2 with ED visits and stumbles/multiple other falls  (1-2x/month)  LIVING ENVIRONMENT: Lives with: lives with their spouse  PLOF: Independent with basic ADLs, Vocation/Vocational requirements: retired in March 2023, and Leisure: [employer] 2x/wk, stretching/yoga (and therapy exercises), walk (range is limited approx 2 blocks--uses hiking poles)  PATIENT GOALS improve coordination, ADLs  OBJECTIVE:   EVAL OBSERVATIONS: Pt reports facial dystonias associated with medications which make it difficult to communicate and difficult to keep eyes open.  Pt with  hx of dystonia/dyskinesias depending on medication cycle.  Bradykinesia noted, impulsivity noted at times.   TODAY'S TREATMENT:   Closed-chain shoulder flex with ball in front of mirror for visual cueing for posture/shoulder positioning.  Also given mod verbal/tactile cueing for midline alignment (tends to shift to R) and L shoulder depression.  Sitting, chair push-ups with focus on shoulder depression with min cueing.    Sit>stand and stand>sit with emphasis on slow, deliberate, large amplitude movement strategies.  Pt did not attempt to dual task by donning/doffing bag today.    PATIENT EDUCATION: Education details:  PWR! Moves (basic 4) in modified quadruped with emphasis and cueing on posture and shoulder positioning  (also utilized mirror for visual cueing).  Emphasized importance of posture to decr risk of future complications.  Person educated: Patient Education method: Explanation, Demonstration, Tactile cues, Verbal cues, and Handouts Education comprehension: verbalized understanding, returned demonstration, verbal cues required, tactile cues required, and needs further education   HOME EXERCISE PROGRAM: Supine closed chain shoulder flexion and chest press with paper towel roll, min-mod v.c 11/18/21:  PWR! Moves (basic 4) in modified quadruped with emphasis and cueing on posture and shoulder positioning   GOALS: Potential Goals reviewed with patient? Yes  SHORT TERM GOALS: Target date: 11/20/21  Pt will be independent be updated PD-specific HEP. Goal status: IN PROGRESS  2.  Pt will demo at least 135* R shoulder flex for functional reaching  Baseline:  125* Goal status: INITIAL  3.  Pt will write at least 4 sentences with good size/legibility. Goal status: INITIAL  4.  Pt will improve L hand coordination for ADLs as shown by improving time on 9-hole peg test by at least 5sec. Baseline:  47.09sec Goal status: INITIAL  5.  Pt will verbalize understanding of  memory/cognitive compensation strategies. Goal status: INITIAL   LONG TERM GOALS: Target date: 01/19/22  Pt will be independent with strategies/AE for ADLs/IADLs.(Including strategy for donning/ doffing jacket) Goal status: INITIAL  2.  Pt will improve L hand coordination for ADLs as shown by improving time on 9-hole peg test by at least 8sec. Baseline: 47.09sec Goal status: INITIAL  3.  Pt will improve bilateral functional reaching/coordination as shown by improving score on box and blocks test by at 4 blocks with LUE. Baseline: L-44 blocks Goal status: INITIAL  4.  Pt will improve bilateral functional reaching/coordination as shown by improving score on box and blocks test by at 4 blocks with RUE. Baseline:  R-47 blocks Goal status: INITIAL  5.  Pt will increase bilateral staGoal status: INITIAL  6.  Pt will demonstrate improved ease with fastening buttons as evidenced by decreasing 3 button/ unbutton time to :49 secs or less.  Baseline: 53.46 Goal status: INITIAL     ASSESSMENT:  CLINICAL IMPRESSION: Pt is progressing towards goals. He demonstrates improved posture, functional mobility, and shoulder positioning at end of session.  PERFORMANCE DEFICITS in functional skills including ADLs, IADLs, coordination, dexterity, tone, ROM, flexibility, FMC, GMC, mobility, balance, body mechanics,  endurance, decreased knowledge of precautions, decreased knowledge of use of DME, and UE functional use, cognitive skills including attention, memory, perception, and safety awareness, and psychosocial skills including environmental adaptation and habits.   IMPAIRMENTS are limiting patient from ADLs, IADLs, and leisure.   COMORBIDITIES may have co-morbidities  that affects occupational performance. Patient will benefit from skilled OT to address above impairments and improve overall function.  MODIFICATION OR ASSISTANCE TO COMPLETE EVALUATION: Min-Moderate modification of tasks or assist  with assess necessary to complete an evaluation.  OT OCCUPATIONAL PROFILE AND HISTORY: Detailed assessment: Review of records and additional review of physical, cognitive, psychosocial history related to current functional performance.  CLINICAL DECISION MAKING: Moderate - several treatment options, min-mod task modification necessary  REHAB POTENTIAL: Good  EVALUATION COMPLEXITY: Moderate   PLAN: OT FREQUENCY: 2x/week  OT DURATION: 8 weeks +eval or 17 visits over 12 weeks for scheduling  PLANNED INTERVENTIONS: self care/ADL training, therapeutic exercise, therapeutic activity, neuromuscular re-education, manual therapy, passive range of motion, balance training, functional mobility training, aquatic therapy, splinting, ultrasound, paraffin, fluidotherapy, moist heat, cryotherapy, patient/family education, cognitive remediation/compensation, energy conservation, and DME and/or AE instructions  RECOMMENDED OTHER SERVICES: current with PT and ST (evals today)  CONSULTED AND AGREED WITH PLAN OF CARE: Patient  PLAN FOR NEXT SESSION: review modified quadraped PWR!,  safety for sit to stand, ADL strategies, **check STG's, schedule additional visits   Kaylanie Capili, OTR/L 11/18/2021, 10:57 AM  [phone] Phone: [phone] 10:57 AM 11/18/21

## 2021-11-18 ENCOUNTER — Encounter: Payer: Self-pay | Admitting: Physical Therapy

## 2021-11-18 ENCOUNTER — Encounter: Payer: Medicare Other | Admitting: Speech Pathology

## 2021-11-18 ENCOUNTER — Ambulatory Visit: Payer: Medicare Other | Admitting: Physical Therapy

## 2021-11-18 ENCOUNTER — Ambulatory Visit: Payer: Medicare Other | Admitting: Occupational Therapy

## 2021-11-18 ENCOUNTER — Encounter: Payer: Self-pay | Admitting: Occupational Therapy

## 2021-11-18 DIAGNOSIS — R293 Abnormal posture: Secondary | ICD-10-CM

## 2021-11-18 DIAGNOSIS — R278 Other lack of coordination: Secondary | ICD-10-CM

## 2021-11-18 DIAGNOSIS — R471 Dysarthria and anarthria: Secondary | ICD-10-CM | POA: Diagnosis not present

## 2021-11-18 DIAGNOSIS — R2681 Unsteadiness on feet: Secondary | ICD-10-CM | POA: Diagnosis not present

## 2021-11-18 DIAGNOSIS — R41840 Attention and concentration deficit: Secondary | ICD-10-CM

## 2021-11-18 DIAGNOSIS — R29898 Other symptoms and signs involving the musculoskeletal system: Secondary | ICD-10-CM

## 2021-11-18 DIAGNOSIS — M25611 Stiffness of right shoulder, not elsewhere classified: Secondary | ICD-10-CM

## 2021-11-18 DIAGNOSIS — R29818 Other symptoms and signs involving the nervous system: Secondary | ICD-10-CM

## 2021-11-18 DIAGNOSIS — R41844 Frontal lobe and executive function deficit: Secondary | ICD-10-CM

## 2021-11-18 DIAGNOSIS — R2689 Other abnormalities of gait and mobility: Secondary | ICD-10-CM | POA: Diagnosis not present

## 2021-11-18 NOTE — Therapy (Signed)
OUTPATIENT PHYSICAL THERAPY NEURO TREATMENT   Patient Name: Ronald Noble MRN: [id number] DOB:12-Dec-1945, 76 y.o., male Today's Date: 11/18/2021   PCP: [name], MD REFERRING PROVIDER: Star Age, MD   PT End of Session - 11/18/21 0934     Visit Number 7    Number of Visits 17    Date for PT Re-Evaluation 01/18/22   Due to delay in scheduling   Authorization Type Medicare & BCBS    PT Start Time [phone]    PT Stop Time 1014    PT Time Calculation (min) 41 min    Equipment Utilized During Treatment Gait belt    Activity Tolerance Patient tolerated treatment well    Behavior During Therapy [facility] for tasks assessed/performed;Impulsive                Past Medical History:  Diagnosis Date   BPH (benign prostatic hyperplasia)    COVID 04/24/2020   scratchy throat x 1 day all symptoms resolved   Parkinson disease ([place])    Skin cancer of face    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   Sleep apnea    mild no cpap used   Urethral stricture    Past Surgical History:  Procedure Laterality Date   BURR HOLE W/ STEREOTACTIC INSERTION OF DBS LEADS / INTRAOP MICROELECTRODE RECORDING  04/2014   [facility] in [facility]   COLONOSCOPY  2019   [place] N/A 06/15/2019   Procedure: CYSTOSCOPY WITH URETHRAL DILATATION;  Surgeon: Franchot Gallo, MD;  Location: [facility];  Service: Urology;  Laterality: N/A;  [place] N/A 05/09/2020   Procedure: CYSTOSCOPY WITH BALLOON DILATATION OF POSTERIOR URETHRAL STRICTURE, FLUOROSCOPIC INTERPRETATION;  Surgeon: Franchot Gallo, MD;  Location: [facility];  Service: Urology;  Laterality: N/A;   KNEE SURGERY  yrs ago   exploratory/left   SKIN CANCER EXCISION  01/2014   face   TRANSURETHRAL RESECTION OF PROSTATE     2008   Patient Active Problem List   Diagnosis Date Noted   Rib pain on right side 12/10/2020    Hyperlipidemia 10/11/2020   Bilateral olecranon bursitis 07/05/2019   Other bursitis of knee, left knee 12/06/2018   Pain in right leg 12/06/2018   Parkinson disease ([place])     ONSET DATE: 10/02/2021 (date of referral)  REFERRING DIAG: G20 (ICD-10-CM) - Parkinson's disease ([place])  THERAPY DIAG:  Other symptoms and signs involving the nervous system  Other lack of coordination  Other symptoms and signs involving the musculoskeletal system  Abnormal posture  Rationale for Evaluation and Treatment Rehabilitation  SUBJECTIVE:  SUBJECTIVE STATEMENT: No falls. Saw the doctor yesterday regarding the cut on his legs. Following up again next Monday. Haven't done the U-Step paperwork yet, but it is on his desk to do.   Pt accompanied by: self  PERTINENT HISTORY: PD 2008, bilateral DBS (2018); hyperlipidemia   Pt with facial dystonias/dyskinesias with his medications that make it difficult to communicate and keep his eyes open (happens approx. 45 minutes after taking meds)  PAIN:  Are you having pain? No, but reports soreness up posterior neck/occiput where DBS line is   PRECAUTIONS: Fall Bilateral DBS, No driving, freezing/festination episodes  FALLS: Has patient fallen in last 6 months? Yes. Number of falls Yes. Number of falls 2 with ED visits and stumbles/multiple other falls  (1-2x/month)   PLOF: Independent with basic ADLs, Independent with household mobility without device, and Independent with community mobility with device Independent with basic ADLs, Vocation/Vocational requirements: retired in March 2023, and Leisure: [employer] 2x/wk, stretching/yoga (and therapy exercises), walk (range is limited approx 2 blocks--uses hiking poles)  PATIENT GOALS Wants to work on slowing down.    OBJECTIVE:   TODAY'S TREATMENT:  NMR  Sit <> stands from standard chair; x10 reps with BUE support from chair, then x10 reps without UE support. Pt able to perform all reps with proper technique, anterior weight shifting, and no BLE bracing/retropulsion against chair.  Simulated trying getting up from pt's couch at home from 14" box, thicker blue foam, and a pillow to help pt feel like he is sliding back in his couch like he does at home. Additionally put a blue incline under pt's feet to help facilitate an incr anterior weight shift when standing. Performed x12 reps with working on scooting all the way out towards the edge and focusing on incr forward weight shift to stand. Pt did well with this with no episodes of retropulsion. Pt able to verbalize importance of scooting out towards the edge each time. Took him a couple scoots to perform to get out all the way from the back.   In // bars with use of mirror for visual feedback With rockerboard in A/P direction; weight shifting with focus on midline posture and full weight shifting anteriorly (pt with incr difficulty this direction and needing bars at times for balance), progressed to alternating forward stepping with using eccentric control/weight shifting all the way forwards before stepping off x10 reps each side and then focus on foot clearance when stepping back onto board - pt needing to use intermittent UE support at times for balance. Pt more challenged stepping RLE back onto board, alternating posterior stepping x10 reps each leg, pt able to perform this direction easier and can perform without UE support and improved foot clearance.  Forwards <> backwards step and weightshift with no UE support x10 reps each leg.  Cues for posture in mirror and hinging from hips when shifting weight posteriorly. Cues for wider BOS when stepping forwards. Pt more challenged with balance when stepping LLE and had tendency to stop LLE in midline before stepping  backwards.     At end of session, pt needs reminder cues to put his bag on first before standing up for improved balance.    GAIT: Gait pattern: step through pattern, decreased arm swing- Right, decreased arm swing- Left, decreased stride length, lateral lean- Right, decreased trunk rotation, trunk flexed, and narrow BOS Distance walked: Various clinic distances  Assistive device utilized: None Level of assistance: Supervision.  Comments: Pt did not bring  in any AD into session. Educated on importance of having AD in case for balance.    PATIENT EDUCATION: Education details: Continue HEP. Progress towards goals with sit <> stands.   Person educated: Patient Education method: Explanation Education comprehension: verbalized understanding   HOME EXERCISE PROGRAM: From previous bout of therapy: Seated and standing PWR Up, Quadruped PWR moves on floor, [email or web address]  Access Code: [email or web address] URL: [email or web address] Date: 10/27/2021 Prepared by: Mickie Bail Plaster  Exercises - Standing on one leg in corner   - 1 x daily - 7 x weekly - 1 sets - 3 reps - 30-45 second hold - Sit to stand with band pull-apart   - 1 x daily - 7 x weekly - 3 sets - 10 reps    GOALS: Goals reviewed with patient? Yes  SHORT TERM GOALS: Target date: 11/18/2021  MiniBEST to finish being assessed with LTG written. Baseline: 20/28 Goal status: MET  2.  Pt will perform at least 8 of 10 reps, minimal to no UE support, with NO retropulsion or posterior lean for imrpoved trasnfer safety and efficiency.  Baseline:  Goal status: MET   LONG TERM GOALS: Target date: 12/16/2021  Pt will be independent with final HEP for improved strength, balance, transfers, and gait.   Baseline:  Goal status: INITIAL  2.  Patient will verbalize fall prevention strategies in the home, including tips to help with festination episodes in order to decrease risk of future falls.  Baseline:  Goal status: INITIAL  3. Pt  will improve MiniBest to 24/28 for decreased fall risk and improvement with compensatory stepping strategies.   Baseline: 20/28 Goal status: REVISED  4.  Pt will demonstrate appropriate use of LRAD for indoor and household activities, to assist with gait safety, at least 200 ft with supervision.  Baseline:  Goal status: INITIAL  5.  Pt will perform at least 10 reps with minimal to no UE support from lower/soft surfaces with supervision and no instances of retropulsion in order to demo improvement getting up from the couch. Baseline:  Goal status: INITIAL   ASSESSMENT:  CLINICAL IMPRESSION: Pt met STG #2 in regards to sit <> stands, pt able to perform each rep without retropulsion and with proper technique/weight shift. Worked on practicing sit <> stands from a lower/squishier surface to simulate getting up from his low couch, with pt able to verbalize understanding on making sure that he scoots out to the edge each time. Remainder of session focused on weight shifting and stepping strategies for balance. Pt tolerated session well, will continue to progress towards LTGs.    PERSONAL FACTORS Behavior pattern, Past/current experiences, Time since onset of injury/illness/exacerbation, and 3+ comorbidities: PD 2008, bilateral DBS (2018); hyperlipidemia, dystonia/dyskinesias  are also affecting patient's functional outcome.    PLAN: PT FREQUENCY: 2x/week  PT DURATION: 12 weeks  PLANNED INTERVENTIONS: Therapeutic exercises, Therapeutic activity, Neuromuscular re-education, Balance training, Gait training, Patient/Family education, Self Care, Stair training, and DME instructions  PLAN FOR NEXT SESSION:  Add more appts, 2x week for 4 weeks? With PT,OT,ST   Work on sit <> stands with incr forward lean. Stepping/foot clearance tasks. SLS for balance. Unlevel surfaces. Work on posture and midline orientation. Wider BOS. Practice with U-Step Rollator (pt still needs to fill out form to obtain one  of these)   Arliss Journey, PT, DPT 11/18/2021, 10:17 AM

## 2021-11-24 NOTE — Therapy (Signed)
OUTPATIENT OCCUPATIONAL THERAPY NEURO Treatment  Patient Name: Ronald Noble MRN: [id number] DOB:1945-08-02, 76 y.o., male Today's Date: 11/25/2021  PCP: Dr. [name] REFERRING PROVIDER: Dr. Star Age   OT End of Session - 11/25/21 1020     Visit Number 8    Number of Visits 17    Date for OT Re-Evaluation 01/18/22    Authorization Type Medicare and BCBS--covered 100%    Authorization Time Period cert period 0/8/65-7/84/69    Authorization - Visit Number 8    Authorization - Number of Visits 10    Progress Note Due on Visit 10    OT Start Time 1019    OT Stop Time 1100    OT Time Calculation (min) 41 min    Activity Tolerance Patient tolerated treatment well    Behavior During Therapy [facility] for tasks assessed/performed;Impulsive                Past Medical History:  Diagnosis Date   BPH (benign prostatic hyperplasia)    COVID 04/24/2020   scratchy throat x 1 day all symptoms resolved   Parkinson disease ([place])    Skin cancer of face    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   Sleep apnea    mild no cpap used   Urethral stricture    Past Surgical History:  Procedure Laterality Date   BURR HOLE W/ STEREOTACTIC INSERTION OF DBS LEADS / INTRAOP MICROELECTRODE RECORDING  04/2014   [facility], [facility] in 2020   COLONOSCOPY  2019   [place] N/A 06/15/2019   Procedure: CYSTOSCOPY WITH URETHRAL DILATATION;  Surgeon: Franchot Gallo, MD;  Location: [facility];  Service: Urology;  Laterality: N/A;  [place] N/A 05/09/2020   Procedure: CYSTOSCOPY WITH BALLOON DILATATION OF POSTERIOR URETHRAL STRICTURE, FLUOROSCOPIC INTERPRETATION;  Surgeon: Franchot Gallo, MD;  Location: [facility];  Service: Urology;  Laterality: N/A;   KNEE SURGERY  yrs ago   exploratory/left   SKIN CANCER EXCISION  01/2014   face   TRANSURETHRAL RESECTION OF PROSTATE      2008   Patient Active Problem List   Diagnosis Date Noted   Rib pain on right side 12/10/2020   Hyperlipidemia 10/11/2020   Bilateral olecranon bursitis 07/05/2019   Other bursitis of knee, left knee 12/06/2018   Pain in right leg 12/06/2018   Parkinson disease ([place])     ONSET DATE: 10/02/21  (referral date, recommended from previous therapy d/c)  REFERRING DIAG: Parkinson's Disease   THERAPY DIAG:  Other symptoms and signs involving the nervous system  Other lack of coordination  Other symptoms and signs involving the musculoskeletal system  Abnormal posture  Stiffness of right shoulder, not elsewhere classified  Attention and concentration deficit  Frontal lobe and executive function deficit  Unsteadiness on feet  Rationale for Evaluation and Treatment Rehabilitation  SUBJECTIVE:   SUBJECTIVE STATEMENT:  "About the same"   Goes by"Ronald Noble"  Pt accompanied by: self  PERTINENT HISTORY: Parkinson's Disease s/p bilateral DBS  PMH:  hx of skin CA, sleep apnea, bilateral DBS   PRECAUTIONS: Fall and Other: Bilateral DBS  WEIGHT BEARING RESTRICTIONS No  PAIN:  Are you having pain? No and Yes: NPRS scale: 2-3/10 Pain location: L shoulder Pain description: sharp at times Aggravating factors: raising arm Relieving factors: rest  FALLS: Has patient fallen in last 6 months? Yes. Number of falls 2 with ED visits and  stumbles/multiple other falls  (1-2x/month)  LIVING ENVIRONMENT: Lives with: lives with their spouse  PLOF: Independent with basic ADLs, Vocation/Vocational requirements: retired in March 2023, and Leisure: [employer] 2x/wk, stretching/yoga (and therapy exercises), walk (range is limited approx 2 blocks--uses hiking poles)  PATIENT GOALS improve coordination, ADLs  OBJECTIVE:   EVAL OBSERVATIONS: Pt reports facial dystonias associated with medications which make it difficult to communicate and difficult to keep eyes open.  Pt with hx of  dystonia/dyskinesias depending on medication cycle.  Bradykinesia noted, impulsivity noted at times.   TODAY'S TREATMENT:   Sitting, Closed-chain shoulder flex with ball in front of mirror for visual cueing for posture/shoulder positioning.  Also given mod verbal/tactile cueing for midline alignment (tends to shift to R) and L shoulder depression.  Sit>stand and stand>sit with emphasis on slow, deliberate, large amplitude movement strategies and mid-line alignment.  Standing, functional reaching with mirror and mod verbal/tactile cueing with emphasis on large amplitude movements including wt. Shift, trunk rotation, and lateral and cross body reaching with each hand (opening hand fully).  Sitting, flipping cards with both hands simultaneously for coordination/timing and min-mod verbal/tactile cueing for posture/mid-line alignment and incorporating full finger ext and supination to release cards.   PATIENT EDUCATION: Education details:  Awareness of posture/midline alignment and affect on daily activities (focus on starting with feet--apart, straight feet/knee, wt. Shift to L with pushing through R foot, then shoulder depression and head position).  Pt instructed to avoid doing overhead weights with UE due to pain, abnormal shoulder position/posture, and risk for injury.  Person educated: Patient Education method: Explanation, Demonstration, Tactile cues, and Verbal cues Education comprehension: verbalized understanding, returned demonstration, verbal cues required, tactile cues required, and needs further education   HOME EXERCISE PROGRAM: Supine closed chain shoulder flexion and chest press with paper towel roll, min-mod v.c 11/18/21:  PWR! Moves (basic 4) in modified quadruped with emphasis and cueing on posture and shoulder positioning  11/25/21:  Pt instructed to avoid doing overhead weights with UE due to pain, abnormal shoulder position/posture, and risk for injury.   GOALS: Potential  Goals reviewed with patient? Yes  SHORT TERM GOALS: Target date: 11/20/21  Pt will be independent be updated PD-specific HEP. Goal status: IN PROGRESS  2.  Pt will demo at least 135* R shoulder flex for functional reaching  Baseline:  125* Goal status: INITIAL  3.  Pt will write at least 4 sentences with good size/legibility. Goal status: INITIAL  4.  Pt will improve L hand coordination for ADLs as shown by improving time on 9-hole peg test by at least 5sec. Baseline:  47.09sec Goal status: INITIAL  5.  Pt will verbalize understanding of memory/cognitive compensation strategies. Goal status: INITIAL   LONG TERM GOALS: Target date: 01/19/22  Pt will be independent with strategies/AE for ADLs/IADLs.(Including strategy for donning/ doffing jacket) Goal status: INITIAL  2.  Pt will improve L hand coordination for ADLs as shown by improving time on 9-hole peg test by at least 8sec. Baseline: 47.09sec Goal status: INITIAL  3.  Pt will improve bilateral functional reaching/coordination as shown by improving score on box and blocks test by at 4 blocks with LUE. Baseline: L-44 blocks Goal status: INITIAL  4.  Pt will improve bilateral functional reaching/coordination as shown by improving score on box and blocks test by at 4 blocks with RUE. Baseline:  R-47 blocks Goal status: INITIAL  5.  Pt will increase bilateral staGoal status: INITIAL  6.  Pt will demonstrate  improved ease with fastening buttons as evidenced by decreasing 3 button/ unbutton time to :49 secs or less. Baseline: 53.46 Goal status: INITIAL     ASSESSMENT:  CLINICAL IMPRESSION: Pt is progressing towards goals. He demonstrates improved posture, functional mobility, and shoulder positioning at end of session and with cueing.  PERFORMANCE DEFICITS in functional skills including ADLs, IADLs, coordination, dexterity, tone, ROM, flexibility, FMC, GMC, mobility, balance, body mechanics, endurance, decreased  knowledge of precautions, decreased knowledge of use of DME, and UE functional use, cognitive skills including attention, memory, perception, and safety awareness, and psychosocial skills including environmental adaptation and habits.   IMPAIRMENTS are limiting patient from ADLs, IADLs, and leisure.   COMORBIDITIES may have co-morbidities  that affects occupational performance. Patient will benefit from skilled OT to address above impairments and improve overall function.  MODIFICATION OR ASSISTANCE TO COMPLETE EVALUATION: Min-Moderate modification of tasks or assist with assess necessary to complete an evaluation.  OT OCCUPATIONAL PROFILE AND HISTORY: Detailed assessment: Review of records and additional review of physical, cognitive, psychosocial history related to current functional performance.  CLINICAL DECISION MAKING: Moderate - several treatment options, min-mod task modification necessary  REHAB POTENTIAL: Good  EVALUATION COMPLEXITY: Moderate   PLAN: OT FREQUENCY: 2x/week  OT DURATION: 8 weeks +eval or 17 visits over 12 weeks for scheduling  PLANNED INTERVENTIONS: self care/ADL training, therapeutic exercise, therapeutic activity, neuromuscular re-education, manual therapy, passive range of motion, balance training, functional mobility training, aquatic therapy, splinting, ultrasound, paraffin, fluidotherapy, moist heat, cryotherapy, patient/family education, cognitive remediation/compensation, energy conservation, and DME and/or AE instructions  RECOMMENDED OTHER SERVICES: current with PT and ST (evals today)  CONSULTED AND AGREED WITH PLAN OF CARE: Patient  PLAN FOR NEXT SESSION:  review modified quadraped PWR!,  safety for sit to stand, ADL strategies, **check STG's   Alexismarie Flaim, OTR/L 11/25/2021, 11:07 AM [phone] Phone: [phone] 11:07 AM 11/25/21

## 2021-11-25 ENCOUNTER — Encounter: Payer: Self-pay | Admitting: Occupational Therapy

## 2021-11-25 ENCOUNTER — Ambulatory Visit: Payer: Medicare Other | Admitting: Occupational Therapy

## 2021-11-25 ENCOUNTER — Ambulatory Visit: Payer: Medicare Other | Admitting: Physical Therapy

## 2021-11-25 ENCOUNTER — Ambulatory Visit: Payer: Medicare Other | Admitting: Speech Pathology

## 2021-11-25 DIAGNOSIS — R293 Abnormal posture: Secondary | ICD-10-CM

## 2021-11-25 DIAGNOSIS — R2689 Other abnormalities of gait and mobility: Secondary | ICD-10-CM | POA: Diagnosis not present

## 2021-11-25 DIAGNOSIS — R41844 Frontal lobe and executive function deficit: Secondary | ICD-10-CM

## 2021-11-25 DIAGNOSIS — R41840 Attention and concentration deficit: Secondary | ICD-10-CM

## 2021-11-25 DIAGNOSIS — I83029 Varicose veins of left lower extremity with ulcer of unspecified site: Secondary | ICD-10-CM | POA: Diagnosis not present

## 2021-11-25 DIAGNOSIS — R29898 Other symptoms and signs involving the musculoskeletal system: Secondary | ICD-10-CM

## 2021-11-25 DIAGNOSIS — R278 Other lack of coordination: Secondary | ICD-10-CM | POA: Diagnosis not present

## 2021-11-25 DIAGNOSIS — L97929 Non-pressure chronic ulcer of unspecified part of left lower leg with unspecified severity: Secondary | ICD-10-CM | POA: Diagnosis not present

## 2021-11-25 DIAGNOSIS — L089 Local infection of the skin and subcutaneous tissue, unspecified: Secondary | ICD-10-CM | POA: Diagnosis not present

## 2021-11-25 DIAGNOSIS — R29818 Other symptoms and signs involving the nervous system: Secondary | ICD-10-CM

## 2021-11-25 DIAGNOSIS — R2681 Unsteadiness on feet: Secondary | ICD-10-CM

## 2021-11-25 DIAGNOSIS — R471 Dysarthria and anarthria: Secondary | ICD-10-CM

## 2021-11-25 DIAGNOSIS — S81812S Laceration without foreign body, left lower leg, sequela: Secondary | ICD-10-CM | POA: Diagnosis not present

## 2021-11-25 DIAGNOSIS — M25611 Stiffness of right shoulder, not elsewhere classified: Secondary | ICD-10-CM

## 2021-11-25 NOTE — Therapy (Signed)
OUTPATIENT SPEECH LANGUAGE PATHOLOGY TREATMENT NOTE   Patient Name: Ronald Noble MRN: [id number] DOB:December 14, 1945, 76 y.o., male Today's Date: 11/25/2021  PCP: [name], MD REFERRING PROVIDER: Star Age, MD  END OF SESSION:   End of Session - 11/25/21 0933     Visit Number 7    Number of Visits 17    Date for SLP Re-Evaluation 12/19/21    Authorization Type Medicare/BCBS    SLP Start Time 0933    SLP Stop Time  2863    SLP Time Calculation (min) 42 min    Activity Tolerance Patient tolerated treatment well                  Past Medical History:  Diagnosis Date   BPH (benign prostatic hyperplasia)    COVID 04/24/2020   scratchy throat x 1 day all symptoms resolved   Parkinson disease ([place])    Skin cancer of face    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   Sleep apnea    mild no cpap used   Urethral stricture    Past Surgical History:  Procedure Laterality Date   BURR HOLE W/ STEREOTACTIC INSERTION OF DBS LEADS / INTRAOP MICROELECTRODE RECORDING  04/2014   [facility] in [facility]   COLONOSCOPY  2019   CYSTOSCOPY WITH URETHRAL DILATATION N/A 06/15/2019   Procedure: CYSTOSCOPY WITH URETHRAL DILATATION;  Surgeon: Franchot Gallo, MD;  Location: [facility];  Service: Urology;  Laterality: N/A;  [place] N/A 05/09/2020   Procedure: CYSTOSCOPY WITH BALLOON DILATATION OF POSTERIOR URETHRAL STRICTURE, FLUOROSCOPIC INTERPRETATION;  Surgeon: Franchot Gallo, MD;  Location: [facility];  Service: Urology;  Laterality: N/A;   KNEE SURGERY  yrs ago   exploratory/left   SKIN CANCER EXCISION  01/2014   face   TRANSURETHRAL RESECTION OF PROSTATE     2008   Patient Active Problem List   Diagnosis Date Noted   Rib pain on right side 12/10/2020   Hyperlipidemia 10/11/2020   Bilateral olecranon bursitis 07/05/2019   Other bursitis of knee, left knee 12/06/2018   Pain  in right leg 12/06/2018   Parkinson disease ([place])     ONSET DATE: PD dx 2008; 10/02/2021 (referral)  REFERRING DIAG: (ICD-10-CM) - Dysarthria and anarthria R26.81  THERAPY DIAG:  Dysarthria and anarthria  Rationale for Evaluation and Treatment Rehabilitation  SUBJECTIVE: reports intermittent compliance with HEP, telling SLP he has done 2x daily about every other day.   PAIN:  Are you having pain? No   OBJECTIVE:  TODAY'S TREATMENT:  11-25-21: Per pt report, cell phone reminder intermittently successful in aiding recall of medication administration. Pt tells SLP he turns off cell phone or won't have it with him which can impact success. Also tells SLP he sometimes doesn't want to take meds d/t side effects, plans to f/u with PCP regarding medication options. SLP provided education on environmental modifications and routine based cues to aid in recall of medication administration. Pt with sub-optimal vocal intensity during conversation. SLP led pt through [place] 13 to optimize communication effectiveness through increasing vocal intensity and clarity. Usual model and rare min-A for completion. Averages as follows: sustained "ah" 92 dB; counting 81 dB; reading 75 dB; cognitive exercise 69 dB. Targeted carryover of skills to structured conversation, answering questions requiring usual min-A.   11-12-21:  Entered with instructions from OT to don/doff bag while seated for optimal safety. Usual mod  to max A required to complete task as instructed and recall placement of bag. Targeted improving vocal quality and increasing intensity through progressively difficulty speech tasks using Speak Out! program, lesson 5. ST leads pt through exercises providing occasional model prior to pt execution. Occasional min-A required to achieve target dB and slow rate this date. Averages this date: loud "ah" 91 dB; counting 86 dB, reading 83 dB; cognitive speech task 76 dB. Side comments and conversational  sample of approx 5 minutes, pt averages 70 dB in quiet environment, with usual min-A to carryover intentional speech and achieve targeted conversational volume. Discussed incorporating daily speech practice into routine as minimal carryover of HEP demonstrated at this time.   PATIENT EDUCATION: Education details: see above Person educated: Patient Education method: Explanation, Demonstration, and Handouts Education comprehension: verbalized understanding, returned demonstration, and needs further education     HOME EXERCISE PROGRAM: Speak Out!     GOALS: Goals reviewed with patient? Yes   SHORT TERM GOALS: Target date: 11/17/2021     Pt will complete Speak Out! HEP at least 1x/day given occasional min A over 2 sessions  Baseline: Goal status: ongoing   2.  Pt will achieve targeted dB (85-90 dB) on warm up exercises with 80% accuracy given occasional min A over 2 sessions  Baseline:  11-07-21, 11-12-21 Goal status: Met   3.  Pt will achieve targeted dB (75-85 dB) on reading exercises with 80% accuracy given occasional min A over 2 sessions Baseline: 11-07-21, 11-12-21 Goal status: Met   4.  Pt will achieve targeted dB (72-78 dB) on cognitive exercises with 80% accuracy given occasional min A over 2 sessions Baseline: 11-07-21, 11-12-21 Goal status: Met   5.  Pt will utilize dysarthria compensations in 5-10 minute conversation to optimize vocal intensity and clarity given occasional min A over 2 sessions  Baseline: 11-07-21, 11-25-21 Goal status: Met   6.  Pt will utilize internal/external memory and attention aids to optimize medication management and completion of daily tasks given occasional min A over 2 sessions  Baseline: 11-25-21 Goal status: Partially met   LONG TERM GOALS: Target date: 12/19/2021     Pt will complete Speak Out! HEP at least 1x/day (BID recommended) > 1 week  Baseline:  Goal status: ongoing   2.  Pt will achieve targeted dB levels in demonstration of Speak Out!  Lessons with 90% accuracy given rare min A over 2 sessions  Baseline:  Goal status: ongoing   3.  Pt will utilize dysarthria compensations in 15+ minute conversation to optimize vocal intensity and clarity given rare min A over 2 sessions Baseline:  Goal status: ongoing   4.  Pt will utilize internal/external memory and attention aids to optimize medication management and completion of daily tasks given rare min A over 2 sessions  Baseline:  Goal status: ongoing   5.  Pt will report improved cognitive communication via PROM by 2 point improvements by last ST session  Baseline: CF=84; CPIB=5 Goal status: ongoing       ASSESSMENT:   CLINICAL IMPRESSION: "Ronald Noble" is a 76 y.o. male who was seen today for dysarthria and cognitive changes related to progressive Parkinson's Disease. Conducted ongoing education and instruction of Speak Out! Program and principles to maximize vocal intensity and clarity secondary to PD related speech changes. Pt benefited from repetition of information to aid recall and accurate completion. Given change in cognition and decline in speech intelligibility, skilled ST intervention is recommended to optimize communication effectiveness  as well as increase functional independence and safety through use of cognitive compensatory training.    OBJECTIVE IMPAIRMENTS  Objective impairments include attention, memory, executive functioning, and dysarthria. These impairments are limiting patient from managing medications, household responsibilities, and effectively communicating at home and in community.Factors affecting potential to achieve goals and functional outcome are ability to learn/carryover information, cooperation/participation level, and medical prognosis. Patient will benefit from skilled SLP services to address above impairments and improve overall function.   REHAB POTENTIAL: Fair - hx of reduced HEP compliance    PLAN: SLP FREQUENCY: 2x/week   SLP DURATION: 8  weeks   PLANNED INTERVENTIONS: Cueing hierachy, Cognitive reorganization, Internal/external aids, Functional tasks, Multimodal communication approach, SLP instruction and feedback, Compensatory strategies, and Patient/family education   Su Monks, CCC-SLP 11/25/2021, 9:45 AM

## 2021-11-25 NOTE — Therapy (Signed)
OUTPATIENT PHYSICAL THERAPY NEURO TREATMENT   Patient Name: Ronald Noble MRN: [id number] DOB:09/06/1945, 76 y.o., male Today's Date: 11/25/2021   PCP: [name], MD REFERRING PROVIDER: Star Age, MD   PT End of Session - 11/25/21 0850     Visit Number 8    Number of Visits 17    Date for PT Re-Evaluation 01/18/22   Due to delay in scheduling   Authorization Type Medicare & BCBS    PT Start Time 0848    PT Stop Time 0927    PT Time Calculation (min) 39 min    Equipment Utilized During Treatment --    Activity Tolerance Patient tolerated treatment well    Behavior During Therapy [facility] for tasks assessed/performed                Past Medical History:  Diagnosis Date   BPH (benign prostatic hyperplasia)    COVID 04/24/2020   scratchy throat x 1 day all symptoms resolved   Parkinson disease ([place])    Skin cancer of face    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   Sleep apnea    mild no cpap used   Urethral stricture    Past Surgical History:  Procedure Laterality Date   BURR HOLE W/ STEREOTACTIC INSERTION OF DBS LEADS / INTRAOP MICROELECTRODE RECORDING  04/2014   [facility] in [facility]   COLONOSCOPY  2019   CYSTOSCOPY WITH URETHRAL DILATATION N/A 06/15/2019   Procedure: CYSTOSCOPY WITH URETHRAL DILATATION;  Surgeon: Franchot Gallo, MD;  Location: [facility];  Service: Urology;  Laterality: N/A;  [place] N/A 05/09/2020   Procedure: CYSTOSCOPY WITH BALLOON DILATATION OF POSTERIOR URETHRAL STRICTURE, FLUOROSCOPIC INTERPRETATION;  Surgeon: Franchot Gallo, MD;  Location: [facility];  Service: Urology;  Laterality: N/A;   KNEE SURGERY  yrs ago   exploratory/left   SKIN CANCER EXCISION  01/2014   face   TRANSURETHRAL RESECTION OF PROSTATE     2008   Patient Active Problem List   Diagnosis Date Noted   Rib pain on right side 12/10/2020   Hyperlipidemia  10/11/2020   Bilateral olecranon bursitis 07/05/2019   Other bursitis of knee, left knee 12/06/2018   Pain in right leg 12/06/2018   Parkinson disease ([place])     ONSET DATE: 10/02/2021 (date of referral)  REFERRING DIAG: G20 (ICD-10-CM) - Parkinson's disease ([place])  THERAPY DIAG:  Other lack of coordination  Abnormal posture  Unsteadiness on feet  Other abnormalities of gait and mobility  Rationale for Evaluation and Treatment Rehabilitation  SUBJECTIVE:  SUBJECTIVE STATEMENT: Pt reports having good weekend, is going to doctor this afternoon for wound check. Exercises are going well, "I have done some but not as much as I should". Went to rock steady boxing twice last week   Pt accompanied by: self  PERTINENT HISTORY: PD 2008, bilateral DBS (2018); hyperlipidemia   Pt with facial dystonias/dyskinesias with his medications that make it difficult to communicate and keep his eyes open (happens approx. 45 minutes after taking meds)  PAIN:  Are you having pain? No, but reports soreness up posterior neck/occiput where DBS line is   PRECAUTIONS: Fall Bilateral DBS, No driving, freezing/festination episodes  FALLS: Has patient fallen in last 6 months? Yes. Number of falls Yes. Number of falls 2 with ED visits and stumbles/multiple other falls  (1-2x/month)   PLOF: Independent with basic ADLs, Independent with household mobility without device, and Independent with community mobility with device Independent with basic ADLs, Vocation/Vocational requirements: retired in March 2023, and Leisure: [employer] 2x/wk, stretching/yoga (and therapy exercises), walk (range is limited approx 2 blocks--uses hiking poles)  PATIENT GOALS Wants to work on slowing down.   OBJECTIVE:  TODAY'S TREATMENT:  NMR   Sit <>stands w/rockerboard under feet in A/P direction for improved anterior weight shift, midline orientation and immediate standing balance. Pt required BUE support initially and demonstrated significant retropulsion, min cues for full hip and knee extension at top of rep as pt exhibited hunched forward posture to compensate for posterior lean. Pt performed 5 reps without mirror and noted posterolateral lean to R side. Remainder of reps performed in front of mirror for visual biofeedback on body position. Pt performed x10 reps w/BUE support and 15-20 reps without UE support w/min cues for significant anterior lean to perform task. Added standing lateral trunk leans to facilitate trunk midline orientation. Pt unable to lean to L side without losing balance posteriorly. Noted poor eccentric control with stand <>sit throughout  In front of mirror, RDL to high pull w/10# DB for improved postural control, high amplitude movement and anterior weight shift, x25 reps. Max multimodal cues to maintain neutral spine and perform high amplitude pull throughout. Min cues to maintain gaze in mirror for body position, as pt continues to round his back and lean to R side despite max multimodal cues.    GAIT: Gait pattern: step through pattern, decreased arm swing- Right, decreased arm swing- Left, decreased stride length, lateral lean- Right, decreased trunk rotation, trunk flexed, and narrow BOS Distance walked: Various clinic distances  Assistive device utilized: None Level of assistance: Supervision.  Comments: Pt did not bring in any AD into session. Educated on importance of having AD in case for balance.    PATIENT EDUCATION: Education details: Continue HEP. Pt inquriing about purchasing rockerboard and KB. Educated pt on [place] but discouraged purchase of rockerboard due to safety concerns at home. Pt verbalized understanding  Person educated: Patient Education method: Explanation Education  comprehension: verbalized understanding   HOME EXERCISE PROGRAM: From previous bout of therapy: Seated and standing PWR Up, Quadruped PWR moves on floor, [place]  Access Code: [email or web address] URL: [email or web address] Date: 10/27/2021 Prepared by: Mickie Bail Albie Bazin  Exercises - Standing on one leg in corner   - 1 x daily - 7 x weekly - 1 sets - 3 reps - 30-45 second hold - Sit to stand with band pull-apart   - 1 x daily - 7 x weekly - 3 sets - 10 reps    GOALS: Goals reviewed with patient?  Yes  SHORT TERM GOALS: Target date: 11/18/2021  MiniBEST to finish being assessed with LTG written. Baseline: 20/28 Goal status: MET  2.  Pt will perform at least 8 of 10 reps, minimal to no UE support, with NO retropulsion or posterior lean for imrpoved trasnfer safety and efficiency.  Baseline:  Goal status: MET   LONG TERM GOALS: Target date: 12/16/2021  Pt will be independent with final HEP for improved strength, balance, transfers, and gait.   Baseline:  Goal status: INITIAL  2.  Patient will verbalize fall prevention strategies in the home, including tips to help with festination episodes in order to decrease risk of future falls.  Baseline:  Goal status: INITIAL  3. Pt will improve MiniBest to 24/28 for decreased fall risk and improvement with compensatory stepping strategies.   Baseline: 20/28 Goal status: REVISED  4.  Pt will demonstrate appropriate use of LRAD for indoor and household activities, to assist with gait safety, at least 200 ft with supervision.  Baseline:  Goal status: INITIAL  5.  Pt will perform at least 10 reps with minimal to no UE support from lower/soft surfaces with supervision and no instances of retropulsion in order to demo improvement getting up from the couch. Baseline:  Goal status: INITIAL   ASSESSMENT:  CLINICAL IMPRESSION: Emphasis of skilled PT session on continued sit <>stand practice, postural control and high amplitude  movement. Pt continues to demonstrate retropulsion w/poor body awareness w/sit <>stands but responds well to visual biofeedback via mirror. Pt demonstrates significant posterolateral lean to R side which he is unable to self correct. Continue POC.    PERSONAL FACTORS Behavior pattern, Past/current experiences, Time since onset of injury/illness/exacerbation, and 3+ comorbidities: PD 2008, bilateral DBS (2018); hyperlipidemia, dystonia/dyskinesias  are also affecting patient's functional outcome.    PLAN: PT FREQUENCY: 2x/week  PT DURATION: 12 weeks  PLANNED INTERVENTIONS: Therapeutic exercises, Therapeutic activity, Neuromuscular re-education, Balance training, Gait training, Patient/Family education, Self Care, Stair training, and DME instructions  PLAN FOR NEXT SESSION:  Did more appointments get added?    Work on sit <> stands with incr forward lean. Stepping/foot clearance tasks. SLS for balance. Unlevel surfaces. Work on posture and midline orientation. Wider BOS. Practice with U-Step Rollator (pt still needs to fill out form to obtain one of these). Forward lunger over beam   [employer], PT, DPT 11/25/2021, 9:29 AM

## 2021-11-25 NOTE — Patient Instructions (Addendum)
Drinking water is important, if you've waited until you're thirsty you waited too long  Keep water with you, drink throughout the day. May be helpful to have a water bottle you can easily bring with you  Create a routine  -- on the hour drink a glass of water, every time you take meds drink a glass of water, every time you go to bathroom drink a glass of water, if you're watching TV take a drink of water  Take water with you in the car when you leave the house

## 2021-11-26 DIAGNOSIS — G20 Parkinson's disease: Secondary | ICD-10-CM | POA: Diagnosis not present

## 2021-11-26 DIAGNOSIS — Z888 Allergy status to other drugs, medicaments and biological substances status: Secondary | ICD-10-CM | POA: Diagnosis not present

## 2021-11-26 DIAGNOSIS — Z79899 Other long term (current) drug therapy: Secondary | ICD-10-CM | POA: Diagnosis not present

## 2021-11-26 DIAGNOSIS — K117 Disturbances of salivary secretion: Secondary | ICD-10-CM | POA: Diagnosis not present

## 2021-11-26 DIAGNOSIS — G245 Blepharospasm: Secondary | ICD-10-CM | POA: Diagnosis not present

## 2021-11-26 DIAGNOSIS — G5139 Clonic hemifacial spasm, unspecified: Secondary | ICD-10-CM | POA: Diagnosis not present

## 2021-11-26 DIAGNOSIS — G249 Dystonia, unspecified: Secondary | ICD-10-CM | POA: Diagnosis not present

## 2021-11-26 NOTE — Therapy (Incomplete)
OUTPATIENT OCCUPATIONAL THERAPY NEURO Treatment  Patient Name: Ronald Noble MRN: [id number] DOB:13-Jan-1946, 76 y.o., male Today's Date: 11/27/2021  PCP: Dr. [name] REFERRING PROVIDER: Dr. Star Age   OT End of Session - 11/27/21 0806     Visit Number 9    Number of Visits 17    Date for OT Re-Evaluation 01/18/22    Authorization Type Medicare and BCBS--covered 100%    Authorization Time Period cert period 08/09/29-4/97/02    Authorization - Visit Number 9    Authorization - Number of Visits 10    Progress Note Due on Visit 10    OT Start Time 0805    OT Stop Time 0845    OT Time Calculation (min) 40 min    Activity Tolerance Patient tolerated treatment well    Behavior During Therapy [facility] for tasks assessed/performed;Impulsive                 Past Medical History:  Diagnosis Date   BPH (benign prostatic hyperplasia)    COVID 04/24/2020   scratchy throat x 1 day all symptoms resolved   Parkinson disease ([place])    Skin cancer of face    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   Sleep apnea    mild no cpap used   Urethral stricture    Past Surgical History:  Procedure Laterality Date   BURR HOLE W/ STEREOTACTIC INSERTION OF DBS LEADS / INTRAOP MICROELECTRODE RECORDING  04/2014   [facility] in [facility]   COLONOSCOPY  2019   [place] N/A 06/15/2019   Procedure: CYSTOSCOPY WITH URETHRAL DILATATION;  Surgeon: Franchot Gallo, MD;  Location: [facility];  Service: Urology;  Laterality: N/A;  [place] N/A 05/09/2020   Procedure: CYSTOSCOPY WITH BALLOON DILATATION OF POSTERIOR URETHRAL STRICTURE, FLUOROSCOPIC INTERPRETATION;  Surgeon: Franchot Gallo, MD;  Location: [facility];  Service: Urology;  Laterality: N/A;   KNEE SURGERY  yrs ago   exploratory/left   SKIN CANCER EXCISION  01/2014   face   TRANSURETHRAL RESECTION OF PROSTATE      2008   Patient Active Problem List   Diagnosis Date Noted   Rib pain on right side 12/10/2020   Hyperlipidemia 10/11/2020   Bilateral olecranon bursitis 07/05/2019   Other bursitis of knee, left knee 12/06/2018   Pain in right leg 12/06/2018   Parkinson disease ([place])     ONSET DATE: 10/02/21  (referral date, recommended from previous therapy d/c)  REFERRING DIAG: Parkinson's Disease   THERAPY DIAG:  Other lack of coordination  Abnormal posture  Unsteadiness on feet  Other abnormalities of gait and mobility  Other symptoms and signs involving the nervous system  Other symptoms and signs involving the musculoskeletal system  Stiffness of right shoulder, not elsewhere classified  Attention and concentration deficit  Frontal lobe and executive function deficit  Rationale for Evaluation and Treatment Rehabilitation  SUBJECTIVE:   SUBJECTIVE STATEMENT:  "I'm staying out of trouble"   Goes by "Ronald Noble"  Pt accompanied by: self  PERTINENT HISTORY: Parkinson's Disease s/p bilateral DBS  PMH:  hx of skin CA, sleep apnea, bilateral DBS   PRECAUTIONS: Fall and Other: Bilateral DBS  WEIGHT BEARING RESTRICTIONS No  PAIN:  Are you having pain? No and Yes: NPRS scale: 2-3/10 Pain location: L shoulder Pain description: sharp at times Aggravating factors: raising arm Relieving factors: rest  FALLS: Has patient fallen in last  6 months? Yes. 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  Pt with hx of dystonia/dyskinesias depending on medication cycle.  Bradykinesia noted, impulsivity noted at times.   TODAY'S TREATMENT:   Sitting, Closed-chain shoulder flex with ball in front of mirror for visual cueing for posture/shoulder positioning.  Also given min-mod verbal/tactile cueing for midline alignment (tends to shift to R) and L shoulder depression.  Sit>stand and stand>sit with emphasis on slow, deliberate, large amplitude movement strategies and mid-line alignment with feet apart.  Reviewed PWR! Moves in modified quadruped with min cueing for large amplitude movements and midline postural alignment and shoulder/scapular positioning.  Began checking STGs and discussed progress--see goal section below  Pt wrote 4 sentences with 100% legibility and good letter size.    Applied Kinesiotape (corrective piece to L shoulder) for pain and to assist with alignment/space and instructed pt in wear/care and precautions.  Pt verbalized understanding.   PATIENT EDUCATION: Education details:  Reviewed Awareness of posture/midline alignment and affect on daily activities (focus on starting with feet--apart, straight feet/knee, wt. Shift to L with pushing through R foot, then shoulder depression and head position) and recommendation of PWR! Moves daily and use of mirror for feedback Person educated: Patient Education method: Explanation, Demonstration, Tactile cues, and Verbal cues Education comprehension: verbalized understanding, returned demonstration, verbal cues required, tactile cues required, and needs further education   HOME EXERCISE PROGRAM: Supine closed chain shoulder flexion and chest press with paper towel roll, min-mod v.c 11/18/21:  PWR! Moves (basic 4) in modified quadruped with emphasis and cueing on posture and shoulder positioning  11/25/21:  awareness of postural alignment and affect of functional activities as well recommendations for use  of mirror as feedback   GOALS: Potential Goals reviewed with patient? Yes  SHORT TERM GOALS: Target date: 11/20/21  Pt will be independent be updated PD-specific HEP. Goal status: IN PROGRESS.  Met with modified quadruped PWR!  2.  Pt will demo at least 135* R shoulder flex for functional reaching  Baseline:  125* Goal status: IN PROGRESS  11/27/21  130*   3.  Pt will write at least 4 sentences with good size/legibility. Goal status: MET  11/27/21:  met--no decr in size and improved legibility  4.  Pt will improve L hand coordination for ADLs as shown by improving time on 9-hole peg test by at least 5sec. Baseline:  47.09sec Goal status: IN PROGRESS/Not consistent  11/27/21:  52.41, 42sec.    5.  Pt will verbalize understanding of memory/cognitive compensation strategies. Goal status: INITIAL  11/27/21--not yet issued   LONG TERM GOALS: Target date: 01/19/22  Pt will be independent with strategies/AE for ADLs/IADLs.(Including strategy for donning/ doffing jacket) Goal status: IN PROGRESS  2.  Pt will improve L hand coordination for ADLs as shown by improving time on 9-hole peg test by at least 8sec. Baseline: 47.09sec Goal status: INITIAL  3.  Pt will improve bilateral functional reaching/coordination as shown by improving score on box and blocks test by at 4 blocks with LUE. Baseline: L-44  blocks Goal status: INITIAL  4.  Pt will improve bilateral functional reaching/coordination as shown by improving score on box and blocks test by at 4 blocks with RUE. Baseline:  R-47 blocks Goal status: INITIAL  5.  Pt will increase bilateral staGoal status: INITIAL  6.  Pt will demonstrate improved ease with fastening buttons as evidenced by decreasing 3 button/ unbutton time to :49 secs or less. Baseline: 53.46 Goal status: INITIAL     ASSESSMENT:  CLINICAL IMPRESSION: Pt is progressing slowly towards goals. He demonstrates improved posture, functional mobility, and shoulder  positioning at end of session and with cueing (but less cueing today).  However, pt would benefit from continued reinforcement and cueing for improved calibration and carryover, particularly as coordination appears to be affected by midline alignment/posture.  Pt also demo improved safety with sit>stand in last 2 sessions.  PERFORMANCE DEFICITS in functional skills including ADLs, IADLs, coordination, dexterity, tone, ROM, flexibility, FMC, GMC, mobility, balance, body mechanics, endurance, decreased knowledge of precautions, decreased knowledge of use of DME, and UE functional use, cognitive skills including attention, memory, perception, and safety awareness, and psychosocial skills including environmental adaptation and habits.   IMPAIRMENTS are limiting patient from ADLs, IADLs, and leisure.   COMORBIDITIES may have co-morbidities  that affects occupational performance. Patient will benefit from skilled OT to address above impairments and improve overall function.  MODIFICATION OR ASSISTANCE TO COMPLETE EVALUATION: Min-Moderate modification of tasks or assist with assess necessary to complete an evaluation.  OT OCCUPATIONAL PROFILE AND HISTORY: Detailed assessment: Review of records and additional review of physical, cognitive, psychosocial history related to current functional performance.  CLINICAL DECISION MAKING: Moderate - several treatment options, min-mod task modification necessary  REHAB POTENTIAL: Good  EVALUATION COMPLEXITY: Moderate   PLAN: OT FREQUENCY: 2x/week  OT DURATION: 8 weeks +eval or 17 visits over 12 weeks for scheduling  PLANNED INTERVENTIONS: self care/ADL training, therapeutic exercise, therapeutic activity, neuromuscular re-education, manual therapy, passive range of motion, balance training, functional mobility training, aquatic therapy, splinting, ultrasound, paraffin, fluidotherapy, moist heat, cryotherapy, patient/family education, cognitive  remediation/compensation, energy conservation, and DME and/or AE instructions  RECOMMENDED OTHER SERVICES: current with PT and ST (evals today)  CONSULTED AND AGREED WITH PLAN OF CARE: Patient  PLAN FOR NEXT SESSION:  memory/cognitive compensation strategies, issue shoulder flex closed-chain in front of mirror with focus on midline postural alignment and ?possible yellow theraband for scapular retraction and depression (stabilized from above and midline) for incr posture,  safety for sit to stand (feet apart), ADL strategies   Liese Dizdarevic, OTR/L 11/27/2021, 8:07 AM [phone] Phone: [phone] 8:07 AM 11/27/21

## 2021-11-27 ENCOUNTER — Ambulatory Visit: Payer: Medicare Other | Admitting: Occupational Therapy

## 2021-11-27 ENCOUNTER — Encounter: Payer: Self-pay | Admitting: Occupational Therapy

## 2021-11-27 ENCOUNTER — Ambulatory Visit: Payer: Medicare Other | Admitting: Physical Therapy

## 2021-11-27 ENCOUNTER — Ambulatory Visit: Payer: Medicare Other | Admitting: Speech Pathology

## 2021-11-27 DIAGNOSIS — R278 Other lack of coordination: Secondary | ICD-10-CM

## 2021-11-27 DIAGNOSIS — R2689 Other abnormalities of gait and mobility: Secondary | ICD-10-CM

## 2021-11-27 DIAGNOSIS — R29818 Other symptoms and signs involving the nervous system: Secondary | ICD-10-CM

## 2021-11-27 DIAGNOSIS — R29898 Other symptoms and signs involving the musculoskeletal system: Secondary | ICD-10-CM | POA: Diagnosis not present

## 2021-11-27 DIAGNOSIS — M25611 Stiffness of right shoulder, not elsewhere classified: Secondary | ICD-10-CM

## 2021-11-27 DIAGNOSIS — R471 Dysarthria and anarthria: Secondary | ICD-10-CM

## 2021-11-27 DIAGNOSIS — R41840 Attention and concentration deficit: Secondary | ICD-10-CM

## 2021-11-27 DIAGNOSIS — R41841 Cognitive communication deficit: Secondary | ICD-10-CM

## 2021-11-27 DIAGNOSIS — R2681 Unsteadiness on feet: Secondary | ICD-10-CM

## 2021-11-27 DIAGNOSIS — R41844 Frontal lobe and executive function deficit: Secondary | ICD-10-CM

## 2021-11-27 DIAGNOSIS — R293 Abnormal posture: Secondary | ICD-10-CM

## 2021-11-27 NOTE — Therapy (Signed)
OUTPATIENT PHYSICAL THERAPY NEURO TREATMENT   Patient Name: Lenorris Karger MRN: [id number] DOB:08/18/1945, 76 y.o., male Today's Date: 11/27/2021   PCP: [name], MD REFERRING PROVIDER: Star Age, MD   PT End of Session - 11/27/21 0850     Visit Number 9    Number of Visits 17    Date for PT Re-Evaluation 01/18/22   Due to delay in scheduling   Authorization Type Medicare & BCBS    PT Start Time [phone]   Handoff w/OT   PT Stop Time 0929    PT Time Calculation (min) 39 min    Activity Tolerance Patient tolerated treatment well    Behavior During Therapy [facility] for tasks assessed/performed                 Past Medical History:  Diagnosis Date   BPH (benign prostatic hyperplasia)    COVID 04/24/2020   scratchy throat x 1 day all symptoms resolved   Parkinson disease ([place])    Skin cancer of face    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   Sleep apnea    mild no cpap used   Urethral stricture    Past Surgical History:  Procedure Laterality Date   BURR HOLE W/ STEREOTACTIC INSERTION OF DBS LEADS / INTRAOP MICROELECTRODE RECORDING  04/2014   [facility] in [facility]   COLONOSCOPY  2019   CYSTOSCOPY WITH URETHRAL DILATATION N/A 06/15/2019   Procedure: CYSTOSCOPY WITH URETHRAL DILATATION;  Surgeon: Franchot Gallo, MD;  Location: [facility];  Service: Urology;  Laterality: N/A;  [place] N/A 05/09/2020   Procedure: CYSTOSCOPY WITH BALLOON DILATATION OF POSTERIOR URETHRAL STRICTURE, FLUOROSCOPIC INTERPRETATION;  Surgeon: Franchot Gallo, MD;  Location: [facility];  Service: Urology;  Laterality: N/A;   KNEE SURGERY  yrs ago   exploratory/left   SKIN CANCER EXCISION  01/2014   face   TRANSURETHRAL RESECTION OF PROSTATE     2008   Patient Active Problem List   Diagnosis Date Noted   Rib pain on right side 12/10/2020   Hyperlipidemia 10/11/2020   Bilateral olecranon  bursitis 07/05/2019   Other bursitis of knee, left knee 12/06/2018   Pain in right leg 12/06/2018   Parkinson disease ([place])     ONSET DATE: 10/02/2021 (date of referral)  REFERRING DIAG: G20 (ICD-10-CM) - Parkinson's disease ([place])  THERAPY DIAG:  Unsteadiness on feet  Abnormal posture  Other abnormalities of gait and mobility  Rationale for Evaluation and Treatment Rehabilitation  SUBJECTIVE:  SUBJECTIVE STATEMENT: Pt reports he is doing well, no new changes or falls. Has not filled out the U-step paperwork yet. Doctor's appointment for his leg went well, he still has one more appointment to ensure it is healing properly.    Pt accompanied by: self  PERTINENT HISTORY: PD 2008, bilateral DBS (2018); hyperlipidemia   Pt with facial dystonias/dyskinesias with his medications that make it difficult to communicate and keep his eyes open (happens approx. 45 minutes after taking meds)  PAIN:  Are you having pain? Yes: NPRS scale: 6/10 Pain location: L shoulder Pain description: [place] *reports soreness up posterior neck/occiput where DBS line is   PRECAUTIONS: Fall Bilateral DBS, No driving, freezing/festination episodes  FALLS: Has patient fallen in last 6 months? Yes. Number of falls Yes. Number of falls 2 with ED visits and stumbles/multiple other falls  (1-2x/month)   PLOF: Independent with basic ADLs, Independent with household mobility without device, and Independent with community mobility with device Independent with basic ADLs, Vocation/Vocational requirements: retired in March 2023, and Leisure: [employer] 2x/wk, stretching/yoga (and therapy exercises), walk (range is limited approx 2 blocks--uses hiking poles)  PATIENT GOALS Wants to work on slowing down.   OBJECTIVE:  TODAY'S  TREATMENT:  NMR  Sit <>stands w/unilateral DB thruster using RUE and 3# DB for improved postural control, anterior weight shift and immediate standing balance. Pt performed x15 reps in front of mirror for visual biofeedback, pt unable to achieve full shoulder flexion on R side and compensated w/shoulder shrug. Min cues for reduced shoulder flexion to reduce strain on shoulder joint and minimize compensation strategies  At counter in modified plantigrade position for improved postural control, scapular retraction/protraction mobility and thoracic rotation: Scap push-ups, x15 reps w/mod multimodal cues for proper technique. Pt required tactile cues for proper activation of scapular retractors throughout  Thread the needles, x12 per side. Min cues for neutral wrist position (thumbs up) and for full finger extension throughout.  Wall-facing mini squats w/BUEs in 60-90 degrees of abduction for improved postural control and midline orientation. Pt continues to demonstrate significant shift to R side and reported R hip pain w/activity. Activity halted to minimize pain  Ther Ex  SciFit multi-peaks level 9 for 8 minutes using BUE/BLEs for neural priming for reciprocal movement, dynamic cardiovascular conditioning and increased amplitude of stepping. RPE of 6/10 and rated pain 3/10 following activity    GAIT: Gait pattern: step through pattern, decreased arm swing- Right, decreased arm swing- Left, decreased stride length, lateral lean- Right, decreased trunk rotation, trunk flexed, and narrow BOS Distance walked: Various clinic distances  Assistive device utilized: None Level of assistance: Supervision.  Comments: Pt did not bring in any AD into session and states he does not need one.   PATIENT EDUCATION: Education details: Continue HEP. Educated on importance of filling out paperwork for U-step walker now due to time it takes for insurance to approve it. Pt stated he does not feel like he needs walker  right now so he has not filled out the forms but will fill them out this week due so that he can start the process.  Person educated: Patient Education method: Explanation Education comprehension: verbalized understanding and needs further education   HOME EXERCISE PROGRAM: From previous bout of therapy: Seated and standing PWR Up, Quadruped PWR moves on floor, [place]  Access Code: [email or web address] URL: [email or web address] Date: 10/27/2021 Prepared by: Mickie Bail Plaster  Exercises - Standing on one leg in corner   - 1 x daily -  7 x weekly - 1 sets - 3 reps - 30-45 second hold - Sit to stand with band pull-apart   - 1 x daily - 7 x weekly - 3 sets - 10 reps    GOALS: Goals reviewed with patient? Yes  SHORT TERM GOALS: Target date: 11/18/2021  MiniBEST to finish being assessed with LTG written. Baseline: 20/28 Goal status: MET  2.  Pt will perform at least 8 of 10 reps, minimal to no UE support, with NO retropulsion or posterior lean for imrpoved trasnfer safety and efficiency.  Baseline:  Goal status: MET   LONG TERM GOALS: Target date: 12/16/2021  Pt will be independent with final HEP for improved strength, balance, transfers, and gait.   Baseline:  Goal status: INITIAL  2.  Patient will verbalize fall prevention strategies in the home, including tips to help with festination episodes in order to decrease risk of future falls.  Baseline:  Goal status: INITIAL  3. Pt will improve MiniBest to 24/28 for decreased fall risk and improvement with compensatory stepping strategies.   Baseline: 20/28 Goal status: REVISED  4.  Pt will demonstrate appropriate use of LRAD for indoor and household activities, to assist with gait safety, at least 200 ft with supervision.  Baseline:  Goal status: INITIAL  5.  Pt will perform at least 10 reps with minimal to no UE support from lower/soft surfaces with supervision and no instances of retropulsion in order to demo improvement  getting up from the couch. Baseline:  Goal status: INITIAL   ASSESSMENT:  CLINICAL IMPRESSION: Emphasis of skilled PT session on postural control, periscapular strength and thoracic mobility. Pt continues to demonstrate shift to R side and reports R hip pain w/squatting. Pt performs well w/visual biofeedback but is unable to correct R midline shift w/multimodal cues. Pt reports he will fill out form to qualify for U-step walker this week but will require heavy education for its use at home. Continue POC.    PERSONAL FACTORS Behavior pattern, Past/current experiences, Time since onset of injury/illness/exacerbation, and 3+ comorbidities: PD 2008, bilateral DBS (2018); hyperlipidemia, dystonia/dyskinesias  are also affecting patient's functional outcome.    PLAN: PT FREQUENCY: 2x/week  PT DURATION: 12 weeks  PLANNED INTERVENTIONS: Therapeutic exercises, Therapeutic activity, Neuromuscular re-education, Balance training, Gait training, Patient/Family education, Self Care, Stair training, and DME instructions  PLAN FOR NEXT SESSION:  10th visit PN. Did more appointments get added?    Work on sit <> stands with incr forward lean. Stepping/foot clearance tasks. SLS for balance. Unlevel surfaces. Work on posture and midline orientation. Wider BOS. Practice with U-Step Rollator (pt still needs to fill out form to obtain one of these). Forward lunger over beam   Naiah Donahoe E Tiarra Anastacio, PT, DPT 11/27/2021, 9:55 AM

## 2021-11-27 NOTE — Therapy (Signed)
OUTPATIENT SPEECH LANGUAGE PATHOLOGY TREATMENT NOTE   Patient Name: Ronald Noble MRN: [id number] DOB:June 08, 1945, 76 y.o., male Today's Date: 11/27/2021  PCP: [name], MD REFERRING PROVIDER: Star Age, MD  END OF SESSION:   End of Session - 11/27/21 0807     Visit Number 8    Number of Visits 17    Date for SLP Re-Evaluation 12/19/21    Authorization Type Medicare/BCBS    SLP Start Time 0930    SLP Stop Time  5188    SLP Time Calculation (min) 45 min    Activity Tolerance Patient tolerated treatment well                  Past Medical History:  Diagnosis Date   BPH (benign prostatic hyperplasia)    COVID 04/24/2020   scratchy throat x 1 day all symptoms resolved   Parkinson disease ([place])    Skin cancer of face    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   Sleep apnea    mild no cpap used   Urethral stricture    Past Surgical History:  Procedure Laterality Date   BURR HOLE W/ STEREOTACTIC INSERTION OF DBS LEADS / INTRAOP MICROELECTRODE RECORDING  04/2014   [facility]   COLONOSCOPY  2019   CYSTOSCOPY WITH URETHRAL DILATATION N/A 06/15/2019   Procedure: CYSTOSCOPY WITH URETHRAL DILATATION;  Surgeon: Franchot Gallo, MD;  Location[facility];  Service: Urology;  Laterality: N/A;  [place] N/A 05/09/2020   Procedure: CYSTOSCOPY WITH BALLOON DILATATION OF POSTERIOR URETHRAL STRICTURE, FLUOROSCOPIC INTERPRETATION;  Surgeon: Franchot Gallo, MD;  Location: [facility];  Service: Urology;  Laterality: N/A;   KNEE SURGERY  yrs ago   exploratory/left   SKIN CANCER EXCISION  01/2014   face   TRANSURETHRAL RESECTION OF PROSTATE     2008   Patient Active Problem List   Diagnosis Date Noted   Rib pain on right side 12/10/2020   Hyperlipidemia 10/11/2020   Bilateral olecranon bursitis 07/05/2019   Other bursitis of knee, left knee 12/06/2018   Pain  in right leg 12/06/2018   Parkinson disease ([place])     ONSET DATE: PD dx 2008; 10/02/2021 (referral)  REFERRING DIAG: (ICD-10-CM) - Dysarthria and anarthria R26.81  THERAPY DIAG:  Dysarthria and anarthria  Cognitive communication deficit  Rationale for Evaluation and Treatment Rehabilitation  SUBJECTIVE: "had two therapy sessions this morning, I'm a little tired"   PAIN:  Are you having pain? No   OBJECTIVE:  TODAY'S TREATMENT:  11-27-21: Target improving vocal quality and increasing intensity through progressively difficulty speech tasks using Speak Out! program, lesson 14. ST leads pt through exercises providing rare model prior to pt execution. Occasional min-A required to achieve target dB this date and reduce speaking rate. Averages this date: loud "ah" 91 dB; counting 81 dB; reading 77 dB; cognitive speech task 71 dB. required this date during structured practice. Education provided on using stop, think, speak to aid in carryover of intent during more complex tasks. Conversational sample of approx 5 minutes, pt averages 69 dB with usual min-A. ID generalization opportunities over upcoming weekend: phone calls with friends and restaurant lunch with colleague.   11-25-21: Per pt report, cell phone reminder intermittently successful in aiding recall of medication administration. Pt tells SLP he turns off cell phone or won't have it with him which can impact success. Also tells SLP he sometimes  doesn't want to take meds d/t side effects, plans to f/u with PCP regarding medication options. SLP provided education on environmental modifications and routine based cues to aid in recall of medication administration. Pt with sub-optimal vocal intensity during conversation. SLP led pt through [employer] to optimize communication effectiveness through increasing vocal intensity and clarity. Usual model and rare min-A for completion. Averages as follows: sustained "ah" 92 dB; counting 81 dB;  reading 75 dB; cognitive exercise 69 dB. Targeted carryover of skills to structured conversation, answering questions requiring usual min-A.   11-12-21:  Entered with instructions from OT to don/doff bag while seated for optimal safety. Usual mod to max A required to complete task as instructed and recall placement of bag. Targeted improving vocal quality and increasing intensity through progressively difficulty speech tasks using Speak Out! program, lesson 5. ST leads pt through exercises providing occasional model prior to pt execution. Occasional min-A required to achieve target dB and slow rate this date. Averages this date: loud "ah" 91 dB; counting 86 dB, reading 83 dB; cognitive speech task 76 dB. Side comments and conversational sample of approx 5 minutes, pt averages 70 dB in quiet environment, with usual min-A to carryover intentional speech and achieve targeted conversational volume. Discussed incorporating daily speech practice into routine as minimal carryover of HEP demonstrated at this time.   PATIENT EDUCATION: Education details: see above Person educated: Patient Education method: Explanation, Demonstration, and Handouts Education comprehension: verbalized understanding, returned demonstration, and needs further education     HOME EXERCISE PROGRAM: Speak Out!     GOALS: Goals reviewed with patient? Yes   SHORT TERM GOALS: Target date: 11/17/2021     Pt will complete Speak Out! HEP at least 1x/day given occasional min A over 2 sessions  Baseline: Goal status: ongoing   2.  Pt will achieve targeted dB (85-90 dB) on warm up exercises with 80% accuracy given occasional min A over 2 sessions  Baseline:  11-07-21, 11-12-21 Goal status: Met   3.  Pt will achieve targeted dB (75-85 dB) on reading exercises with 80% accuracy given occasional min A over 2 sessions Baseline: 11-07-21, 11-12-21 Goal status: Met   4.  Pt will achieve targeted dB (72-78 dB) on cognitive exercises with 80%  accuracy given occasional min A over 2 sessions Baseline: 11-07-21, 11-12-21 Goal status: Met   5.  Pt will utilize dysarthria compensations in 5-10 minute conversation to optimize vocal intensity and clarity given occasional min A over 2 sessions  Baseline: 11-07-21, 11-25-21 Goal status: Met   6.  Pt will utilize internal/external memory and attention aids to optimize medication management and completion of daily tasks given occasional min A over 2 sessions  Baseline: 11-25-21 Goal status: Partially met   LONG TERM GOALS: Target date: 12/19/2021     Pt will complete Speak Out! HEP at least 1x/day (BID recommended) > 1 week  Baseline:  Goal status: ongoing   2.  Pt will achieve targeted dB levels in demonstration of Speak Out! Lessons with 90% accuracy given rare min A over 2 sessions  Baseline:  Goal status: ongoing   3.  Pt will utilize dysarthria compensations in 15+ minute conversation to optimize vocal intensity and clarity given rare min A over 2 sessions Baseline:  Goal status: ongoing   4.  Pt will utilize internal/external memory and attention aids to optimize medication management and completion of daily tasks given rare min A over 2 sessions  Baseline:  Goal  status: ongoing   5.  Pt will report improved cognitive communication via PROM by 2 point improvements by last ST session  Baseline: CF=84; CPIB=5 Goal status: ongoing       ASSESSMENT:   CLINICAL IMPRESSION: "[name]" is a 76 y.o. male who was seen today for dysarthria and cognitive changes related to progressive Parkinson's Disease. Conducted ongoing education and instruction of Speak Out! Program and principles to maximize vocal intensity and clarity secondary to PD related speech changes. Pt benefited from repetition of information to aid recall and accurate completion. Given change in cognition and decline in speech intelligibility, skilled ST intervention is recommended to optimize communication effectiveness as well  as increase functional independence and safety through use of cognitive compensatory training.    OBJECTIVE IMPAIRMENTS  Objective impairments include attention, memory, executive functioning, and dysarthria. These impairments are limiting patient from managing medications, household responsibilities, and effectively communicating at home and in community.Factors affecting potential to achieve goals and functional outcome are ability to learn/carryover information, cooperation/participation level, and medical prognosis. Patient will benefit from skilled SLP services to address above impairments and improve overall function.   REHAB POTENTIAL: Fair - hx of reduced HEP compliance    PLAN: SLP FREQUENCY: 2x/week   SLP DURATION: 8 weeks   PLANNED INTERVENTIONS: Cueing hierachy, Cognitive reorganization, Internal/external aids, Functional tasks, Multimodal communication approach, SLP instruction and feedback, Compensatory strategies, and Patient/family education   Su Monks, CCC-SLP 11/27/2021, 8:07 AM

## 2021-11-28 DIAGNOSIS — H52203 Unspecified astigmatism, bilateral: Secondary | ICD-10-CM | POA: Diagnosis not present

## 2021-11-28 DIAGNOSIS — Z961 Presence of intraocular lens: Secondary | ICD-10-CM | POA: Diagnosis not present

## 2021-12-01 DIAGNOSIS — L089 Local infection of the skin and subcutaneous tissue, unspecified: Secondary | ICD-10-CM | POA: Diagnosis not present

## 2021-12-01 DIAGNOSIS — L97929 Non-pressure chronic ulcer of unspecified part of left lower leg with unspecified severity: Secondary | ICD-10-CM | POA: Diagnosis not present

## 2021-12-01 DIAGNOSIS — S81812S Laceration without foreign body, left lower leg, sequela: Secondary | ICD-10-CM | POA: Diagnosis not present

## 2021-12-01 DIAGNOSIS — I83029 Varicose veins of left lower extremity with ulcer of unspecified site: Secondary | ICD-10-CM | POA: Diagnosis not present

## 2021-12-04 ENCOUNTER — Ambulatory Visit: Payer: Medicare Other | Admitting: Occupational Therapy

## 2021-12-04 ENCOUNTER — Ambulatory Visit: Payer: Medicare Other | Admitting: Physical Therapy

## 2021-12-04 ENCOUNTER — Encounter: Payer: Self-pay | Admitting: Occupational Therapy

## 2021-12-04 DIAGNOSIS — R2681 Unsteadiness on feet: Secondary | ICD-10-CM

## 2021-12-04 DIAGNOSIS — R293 Abnormal posture: Secondary | ICD-10-CM

## 2021-12-04 DIAGNOSIS — R471 Dysarthria and anarthria: Secondary | ICD-10-CM | POA: Diagnosis not present

## 2021-12-04 DIAGNOSIS — R29818 Other symptoms and signs involving the nervous system: Secondary | ICD-10-CM | POA: Diagnosis not present

## 2021-12-04 DIAGNOSIS — R278 Other lack of coordination: Secondary | ICD-10-CM | POA: Diagnosis not present

## 2021-12-04 DIAGNOSIS — R2689 Other abnormalities of gait and mobility: Secondary | ICD-10-CM | POA: Diagnosis not present

## 2021-12-04 DIAGNOSIS — R29898 Other symptoms and signs involving the musculoskeletal system: Secondary | ICD-10-CM

## 2021-12-04 DIAGNOSIS — M25611 Stiffness of right shoulder, not elsewhere classified: Secondary | ICD-10-CM

## 2021-12-04 DIAGNOSIS — R41840 Attention and concentration deficit: Secondary | ICD-10-CM

## 2021-12-04 DIAGNOSIS — R41844 Frontal lobe and executive function deficit: Secondary | ICD-10-CM

## 2021-12-04 NOTE — Therapy (Signed)
OUTPATIENT PHYSICAL THERAPY NEURO TREATMENT- 10TH VISIT PROGRESS NOTE   Patient Name: Ronald Noble MRN: [id number] DOB:1946/03/17, 76 y.o., male Today's Date: 12/04/2021   PCP: [name], MD REFERRING PROVIDER: Star Age, MD  Physical Therapy Progress Note   Dates of Reporting Period:10/20/21 - 12/04/21  See Note below for Objective Data and Assessment of Progress/Goals.  Thank you for the referral of this patient. Mickie Bail Cortavious Nix, PT, DPT    PT End of Session - 12/04/21 0853     Visit Number 10    Number of Visits 17    Date for PT Re-Evaluation 01/18/22   Due to delay in scheduling   Authorization Type Medicare & BCBS    PT Start Time [phone]   Handoff w/OT   PT Stop Time 0928    PT Time Calculation (min) 35 min    Activity Tolerance Patient tolerated treatment well    Behavior During Therapy [facility] for tasks assessed/performed                  Past Medical History:  Diagnosis Date   BPH (benign prostatic hyperplasia)    COVID 04/24/2020   scratchy throat x 1 day all symptoms resolved   Parkinson disease ([place])    Skin cancer of face    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   Sleep apnea    mild no cpap used   Urethral stricture    Past Surgical History:  Procedure Laterality Date   BURR HOLE W/ STEREOTACTIC INSERTION OF DBS LEADS / INTRAOP MICROELECTRODE RECORDING  04/2014   [facility] in [facility]   COLONOSCOPY  2019   [place] N/A 06/15/2019   Procedure: CYSTOSCOPY WITH URETHRAL DILATATION;  Surgeon: Franchot Gallo, MD;  Location: [facility];  Service: Urology;  Laterality: N/A;  [place] N/A 05/09/2020   Procedure: CYSTOSCOPY WITH BALLOON DILATATION OF POSTERIOR URETHRAL STRICTURE, FLUOROSCOPIC INTERPRETATION;  Surgeon: Franchot Gallo, MD;  Location: [facility];  Service: Urology;  Laterality: N/A;   KNEE SURGERY  yrs  ago   exploratory/left   SKIN CANCER EXCISION  01/2014   face   TRANSURETHRAL RESECTION OF PROSTATE     2008   Patient Active Problem List   Diagnosis Date Noted   Rib pain on right side 12/10/2020   Hyperlipidemia 10/11/2020   Bilateral olecranon bursitis 07/05/2019   Other bursitis of knee, left knee 12/06/2018   Pain in right leg 12/06/2018   Parkinson disease ([place])     ONSET DATE: 10/02/2021 (date of referral)  REFERRING DIAG: G20 (ICD-10-CM) - Parkinson's disease ([place])  THERAPY DIAG:  Unsteadiness on feet  Other abnormalities of gait and mobility  Abnormal posture  Rationale for Evaluation and Treatment Rehabilitation  SUBJECTIVE:  SUBJECTIVE STATEMENT: Pt reports he is doing well, does not have to return to doctor regarding cut on his leg. "I got the green light on that". No new falls, exercises are going well.   Pt accompanied by: self  PERTINENT HISTORY: PD 2008, bilateral DBS (2018); hyperlipidemia   Pt with facial dystonias/dyskinesias with his medications that make it difficult to communicate and keep his eyes open (happens approx. 45 minutes after taking meds)  PAIN:  Are you having pain? Yes: NPRS scale: 2-5/10 Pain location: L shoulder Pain description: [place] *reports soreness up posterior neck/occiput where DBS line is   PRECAUTIONS: Fall Bilateral DBS, No driving, freezing/festination episodes  FALLS: Has patient fallen in last 6 months? Yes. Number of falls Yes. Number of falls 2 with ED visits and stumbles/multiple other falls  (1-2x/month)   PLOF: Independent with basic ADLs, Independent with household mobility without device, and Independent with community mobility with device Independent with basic ADLs, Vocation/Vocational requirements: retired in March 2023,  and Leisure: [employer] 2x/wk, stretching/yoga (and therapy exercises), walk (range is limited approx 2 blocks--uses hiking poles)  PATIENT GOALS Wants to work on slowing down.   OBJECTIVE:  TODAY'S TREATMENT:  NMR  At counter in modified plantigrade position, bird dogs x15 per side for UE/LE coordination, posterior chain strength, and midline postural control. Pt reported pain w/L shoulder extension mid-range but not flexion. Min cues to reduce shoulder shrug compensation for shoulder flexion bilaterally.  With back to wall, cervical extension and scapular retraction against wall sing mirror for visual biofeedback on body position to facilitate upright posture and midline orientation, x5 minutes.  Wall balls using 2 post-its placed superolaterally on wall and 2KG ball for improved BLE strength, high amplitude movement, dual-tasking and set-switching. Pt performed x8 reps per side w/min cues for increased depth of squat. Progressed to adding cog dual-task (naming veggies w/L throw and fruits w/R throw) and pt able to maintain alt sequencing correctly. Final progression to naming animals A-Z with each rep and pt had significant difficulty maintain alphabetic order and naming animal and maintaining proper squat form. CGA throughout.     GAIT: Gait pattern: step through pattern, decreased arm swing- Right, decreased arm swing- Left, decreased stride length, lateral lean- Right, decreased trunk rotation, trunk flexed, and narrow BOS Distance walked: Various clinic distances  Assistive device utilized: None Level of assistance: Supervision.  Comments: Pt did not bring in any AD into session and states he does not need one.   PATIENT EDUCATION: Education details: Continue HEP. Educated on importance of filling out paperwork for U-step walker now due to time it takes for insurance to approve it. Pt stated he does not feel like he needs walker right now so he has not filled out the forms but will fill  them out this week due so that he can start the process.  Person educated: Patient Education method: Explanation Education comprehension: verbalized understanding and needs further education   HOME EXERCISE PROGRAM: From previous bout of therapy: Seated and standing PWR Up, Quadruped PWR moves on floor, [place]  Access Code: [email or web address] URL: [email or web address] Date: 10/27/2021 Prepared by: Mickie Bail Jamieson Lisa  Exercises - Standing on one leg in corner   - 1 x daily - 7 x weekly - 1 sets - 3 reps - 30-45 second hold - Sit to stand with band pull-apart   - 1 x daily - 7 x weekly - 3 sets - 10 reps    GOALS: Goals reviewed with patient? Yes  SHORT TERM GOALS: Target date: 11/18/2021  MiniBEST to finish being assessed with LTG written. Baseline: 20/28 Goal status: MET  2.  Pt will perform at least 8 of 10 reps, minimal to no UE support, with NO retropulsion or posterior lean for imrpoved trasnfer safety and efficiency.  Baseline:  Goal status: MET   LONG TERM GOALS: Target date: 12/16/2021  Pt will be independent with final HEP for improved strength, balance, transfers, and gait.   Baseline:  Goal status: INITIAL  2.  Patient will verbalize fall prevention strategies in the home, including tips to help with festination episodes in order to decrease risk of future falls.  Baseline:  Goal status: INITIAL  3. Pt will improve MiniBest to 24/28 for decreased fall risk and improvement with compensatory stepping strategies.   Baseline: 20/28 Goal status: REVISED  4.  Pt will demonstrate appropriate use of LRAD for indoor and household activities, to assist with gait safety, at least 200 ft with supervision.  Baseline:  Goal status: INITIAL  5.  Pt will perform at least 10 reps with minimal to no UE support from lower/soft surfaces with supervision and no instances of retropulsion in order to demo improvement getting up from the couch. Baseline:  Goal status:  INITIAL   ASSESSMENT:  CLINICAL IMPRESSION: Session limited due late handoff w/OT. Emphasis of skilled PT session on postural control, high amplitude movement and dual-tasking. Pt responds well to visual cues for midline orientation and postural control. Pt has not obtained form to obtain U-step but is in process of performing. Continue POC.   PERSONAL FACTORS Behavior pattern, Past/current experiences, Time since onset of injury/illness/exacerbation, and 3+ comorbidities: PD 2008, bilateral DBS (2018); hyperlipidemia, dystonia/dyskinesias  are also affecting patient's functional outcome.    PLAN: PT FREQUENCY: 2x/week  PT DURATION: 12 weeks  PLANNED INTERVENTIONS: Therapeutic exercises, Therapeutic activity, Neuromuscular re-education, Balance training, Gait training, Patient/Family education, Self Care, Stair training, and DME instructions  PLAN FOR NEXT SESSION:   Work on sit <> stands with incr forward lean. Stepping/foot clearance tasks. SLS for balance. Unlevel surfaces. Work on posture and midline orientation. Wider BOS. Practice with U-Step Rollator (pt still needs to fill out form to obtain one of these). Forward lunger over beam   Kieth Hartis E Maleya Leever, PT, DPT 12/04/2021, 9:28 AM

## 2021-12-04 NOTE — Patient Instructions (Signed)
    SHOULDER: Flexion Bilateral    Raise arms at same speed. Keep elbows straight.  Perform in front of mirror.    Keep right foot/knee straight, trunk straight, shoulders down, head/chest up. 10 reps per set, 2 sets per day.   Maintain object in midline.              Keeping Thinking Skills Sharp: 1. Jigsaw puzzles 2. Card/board games 3. Talking on the phone/social events 4. Lumosity.[email or web address] 5. Online games 6. Word serches/crossword puzzles 7.  Logic puzzles 8. Aerobic exercise (stationary bike) 9. Eating balanced diet (fruits & veggies) 10. Drink water 11. Try something new--new recipe, hobby 12. Crafts 13. Do a variety of activities that are challenging 14.  Plan weekly meals and write a grocery list 15. Add cognitive activities to walking/exercising (think of animal/food/city with each letter of the alphabet, counting backwards, thinking of as many vegetables as you can, etc.).--Only do this  If safe (no freezing/falls).   Memory Compensation Strategies  Use "WARM" strategy. W= write it down A=  associate it R=  repeat it M=  make a mental picture  You can keep a Memory Notebook. Use a 3-ring notebook with sections for the following:  calendar, important names and phone numbers, medications, doctors' names/phone numbers, "to do list"/reminders, and a section to journal what you did each day  Use a calendar to write appointments down.  Write yourself a schedule for the day/make a routine This can be placed on the calendar or in a separate section of the Memory Notebook.  Keeping a regular schedule can help memory.  Use medication organizer with sections for each day or morning/evening pills  You may need help loading it  Keep a basket, or pegboard by the door.   Place items that you need to take out with you in the basket or on the pegboard.  You may also want to include a message board for reminders.  Use sticky notes. Place sticky notes with reminders  in a place where the task is performed.  For example:  "turn off the stove" placed by the stove, "lock the door" placed on the door at eye level, "take your medications" on the bathroom mirror or by the place where you normally take your medications  Use alarms/timers.  Use while cooking to remind yourself to check on food or as a reminder to take your medicine, or as a reminder to make a call, or as a reminder to perform another task, etc.  Use a voice memo to record important information and notes for yourself.  10.  Organize and reduce clutter

## 2021-12-04 NOTE — Therapy (Signed)
OUTPATIENT OCCUPATIONAL THERAPY NEURO Treatment  Patient Name: Ronald Noble MRN: [id number] DOB:[birth year], 76 y.o., male Today's Date: 12/04/2021  PCP: Dr. [name] REFERRING PROVIDER: Dr. Star Age   OT End of Session - 12/04/21 0805     Visit Number 10    Number of Visits 17    Date for OT Re-Evaluation 01/18/22    Authorization Type Medicare and BCBS--covered 100%    Authorization Time Period cert period 09/08/01-5/46/56    Authorization - Visit Number 10    Authorization - Number of Visits 10    Progress Note Due on Visit 10    OT Start Time 0805    OT Stop Time 0845    OT Time Calculation (min) 40 min    Activity Tolerance Patient tolerated treatment well    Behavior During Therapy [facility] for tasks assessed/performed;Impulsive                  Past Medical History:  Diagnosis Date   BPH (benign prostatic hyperplasia)    COVID 04/24/2020   scratchy throat x 1 day all symptoms resolved   Parkinson disease ([place])    Skin cancer of face    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   Sleep apnea    mild no cpap used   Urethral stricture    Past Surgical History:  Procedure Laterality Date   BURR HOLE W/ STEREOTACTIC INSERTION OF DBS LEADS / INTRAOP MICROELECTRODE RECORDING  04/2014   [facility], new leads replaced in 2020   COLONOSCOPY  2019   [place] N/A 06/15/2019   Procedure: CYSTOSCOPY WITH URETHRAL DILATATION;  Surgeon: Franchot Gallo, MD;  Location: [facility];  Service: Urology;  Laterality: N/A;  [place] N/A 05/09/2020   Procedure: CYSTOSCOPY WITH BALLOON DILATATION OF POSTERIOR URETHRAL STRICTURE, FLUOROSCOPIC INTERPRETATION;  Surgeon: Franchot Gallo, MD;  Location: [facility];  Service: Urology;  Laterality: N/A;   KNEE SURGERY  yrs ago   exploratory/left   SKIN CANCER EXCISION  01/2014   face   TRANSURETHRAL RESECTION OF PROSTATE      2008   Patient Active Problem List   Diagnosis Date Noted   Rib pain on right side 12/10/2020   Hyperlipidemia 10/11/2020   Bilateral olecranon bursitis 07/05/2019   Other bursitis of knee, left knee 12/06/2018   Pain in right leg 12/06/2018   Parkinson disease ([place])     ONSET DATE: 10/02/21  (referral date, recommended from previous therapy d/c)  REFERRING DIAG: Parkinson's Disease   THERAPY DIAG:  Other lack of coordination  Abnormal posture  Unsteadiness on feet  Other abnormalities of gait and mobility  Other symptoms and signs involving the nervous system  Other symptoms and signs involving the musculoskeletal system  Stiffness of right shoulder, not elsewhere classified  Attention and concentration deficit  Frontal lobe and executive function deficit  Rationale for Evaluation and Treatment Rehabilitation  SUBJECTIVE:   SUBJECTIVE STATEMENT:  Pt reports that he has been working on posture and PWR! Up; however, pt reports that he finds that he rushes to finish typing prior to losing balance to the R at times.  Goes by "[name]"  Pt accompanied by: self  PERTINENT HISTORY: Parkinson's Disease s/p bilateral DBS  PMH:  hx of skin CA, sleep apnea, bilateral DBS   PRECAUTIONS: Fall and Other: Bilateral DBS  WEIGHT BEARING RESTRICTIONS No  PAIN:  Are you having pain? No  and Yes: NPRS scale: 2-3/10 Pain location: L shoulder Pain description: sharp at times Aggravating factors: raising arm Relieving factors: rest  FALLS: Has patient fallen in last 6 months? Yes. Number of falls 2 with ED visits and stumbles/multiple other falls  (1-2x/month)  LIVING ENVIRONMENT: Lives with: lives with their spouse  PLOF: Independent with basic ADLs, Vocation/Vocational requirements: retired in March 2023, and Leisure: [employer] 2x/wk, stretching/yoga (and therapy exercises), walk (range is limited approx 2 blocks--uses hiking poles)  PATIENT GOALS improve  coordination, ADLs  OBJECTIVE:   EVAL OBSERVATIONS: Pt reports facial dystonias associated with medications which make it difficult to communicate and difficult to keep eyes open.  Pt with hx of dystonia/dyskinesias depending on medication cycle.  Bradykinesia noted, impulsivity noted at times.   TODAY'S TREATMENT:    Sitting, Closed-chain shoulder flex with ball in front of mirror for visual cueing for posture/shoulder positioning.  Also given min-mod verbal/tactile cueing for midline alignment (tends to shift to R) and L shoulder depression.  Sit>stand and stand>sit with emphasis on slow, deliberate, large amplitude movement strategies and mid-line alignment with feet apart and forward lean (min-mod cueing--verbal, mirror, tactile).  PWR! Up in sitting with min-mod cueing (verbal/tactile) on mid-line alignment and large amplitude (in front of mirror).  PWR! Up in standing with scapular depression/retraction with yellow theraband (stabilized from above) with mod cueing for positioning.      Pt arrived with kinesiotape still on L shoulder.  Reviewed that he should remove after 5 days.  Used wet cloth to facilitate removal of kinesiotape (mild pink area once removed that was resolving).     PATIENT EDUCATION: Education details:  Closed-chain shoulder flex with ball in front of mirror with focus on posture/alignment; Keeping Thinking Skills [employer] & Memory Compensation Strategies  Person educated: Patient Education method: Explanation, Demonstration, Tactile cues, and Verbal cues Education comprehension: verbalized understanding, returned demonstration, verbal cues required, and tactile cues required   HOME EXERCISE PROGRAM: Supine closed chain shoulder flexion and chest press with paper towel roll, min-mod v.c 11/18/21:  PWR! Moves (basic 4) in modified quadruped with emphasis and cueing on posture and shoulder positioning  11/25/21:  awareness of postural alignment and affect of functional  activities as well recommendations for use of mirror as feedback  12/04/21:  Closed-chain shoulder flex with ball in front of mirror with focus on posture/alignment; Keeping Thinking Skills [employer] Memory Compensation Strategies   GOALS: Potential Goals reviewed with patient? Yes  SHORT TERM GOALS: Target date: 11/20/21  Pt will be independent be updated PD-specific HEP. Goal status: IN PROGRESS.  Met with modified quadruped PWR!  2.  Pt will demo at least 135* R shoulder flex for functional reaching  Baseline:  125* Goal status: IN PROGRESS  11/27/21  130*   3.  Pt will write at least 4 sentences with good size/legibility. Goal status: MET  11/27/21:  met--no decr in size and improved legibility  4.  Pt will improve L hand coordination for ADLs as shown by improving time on 9-hole peg test by at least 5sec. Baseline:  47.09sec Goal status: IN PROGRESS/Not consistent  11/27/21:  52.41, 42sec.    5.  Pt will verbalize understanding of memory/cognitive compensation strategies. Goal status: MET  11/27/21--not yet issued.  12/04/21:  met   LONG TERM GOALS: Target date: 01/19/22  Pt will be independent with strategies/AE for ADLs/IADLs.(Including strategy for donning/ doffing jacket) Goal status: IN PROGRESS  2.  Pt will improve L hand coordination  for ADLs as shown by improving time on 9-hole peg test by at least 8sec. Baseline: 47.09sec Goal status: INITIAL  3.  Pt will improve bilateral functional reaching/coordination as shown by improving score on box and blocks test by at 4 blocks with LUE. Baseline: L-44 blocks Goal status: INITIAL  4.  Pt will improve bilateral functional reaching/coordination as shown by improving score on box and blocks test by at 4 blocks with RUE. Baseline:  R-47 blocks Goal status: INITIAL  5.  Pt will increase bilateral staGoal status: INITIAL  6.  Pt will demonstrate improved ease with fastening buttons as evidenced by decreasing 3 button/ unbutton  time to :49 secs or less. Baseline: 53.46 Goal status: INITIAL     ASSESSMENT:  CLINICAL IMPRESSION: Progress Note Reporting Period 10/20/21-12/04/21:   Pt is progressing slowly towards goals. He demonstrates improved posture, functional mobility, and shoulder positioning at end of session and with less cueing.  However, pt would benefit from continued reinforcement and cueing for improved calibration and carryover, particularly as coordination and functional mobility appears to be affected by midline alignment/posture.  Pt also demo improved safety with sit>stand.  PERFORMANCE DEFICITS in functional skills including ADLs, IADLs, coordination, dexterity, tone, ROM, flexibility, FMC, GMC, mobility, balance, body mechanics, endurance, decreased knowledge of precautions, decreased knowledge of use of DME, and UE functional use, cognitive skills including attention, memory, perception, and safety awareness, and psychosocial skills including environmental adaptation and habits.   IMPAIRMENTS are limiting patient from ADLs, IADLs, and leisure.   COMORBIDITIES may have co-morbidities  that affects occupational performance. Patient will benefit from skilled OT to address above impairments and improve overall function.  MODIFICATION OR ASSISTANCE TO COMPLETE EVALUATION: Min-Moderate modification of tasks or assist with assess necessary to complete an evaluation.  OT OCCUPATIONAL PROFILE AND HISTORY: Detailed assessment: Review of records and additional review of physical, cognitive, psychosocial history related to current functional performance.  CLINICAL DECISION MAKING: Moderate - several treatment options, min-mod task modification necessary  REHAB POTENTIAL: Good  EVALUATION COMPLEXITY: Moderate   PLAN: OT FREQUENCY: 2x/week  OT DURATION: 8 weeks +eval or 17 visits over 12 weeks for scheduling  PLANNED INTERVENTIONS: self care/ADL training, therapeutic exercise, therapeutic activity,  neuromuscular re-education, manual therapy, passive range of motion, balance training, functional mobility training, aquatic therapy, splinting, ultrasound, paraffin, fluidotherapy, moist heat, cryotherapy, patient/family education, cognitive remediation/compensation, energy conservation, and DME and/or AE instructions  RECOMMENDED OTHER SERVICES: current with PT and ST (evals today)  CONSULTED AND AGREED WITH PLAN OF CARE: Patient  PLAN FOR NEXT SESSION:  PWR! Prone vs. Scapular retraction in prone; ?possible yellow theraband for scapular retraction and depression (stabilized from above and midline) for incr posture; re-apply kinesiotape, coordination with focus on posture,  ADL strategies   Neema Fluegge, OTR/L 12/04/2021, 8:05 AM [phone] Phone: [phone] 8:05 AM 12/04/21

## 2021-12-08 ENCOUNTER — Other Ambulatory Visit: Payer: Self-pay

## 2021-12-08 ENCOUNTER — Emergency Department (HOSPITAL_BASED_OUTPATIENT_CLINIC_OR_DEPARTMENT_OTHER): Payer: Medicare Other

## 2021-12-08 ENCOUNTER — Encounter (HOSPITAL_BASED_OUTPATIENT_CLINIC_OR_DEPARTMENT_OTHER): Payer: Self-pay

## 2021-12-08 ENCOUNTER — Emergency Department (HOSPITAL_BASED_OUTPATIENT_CLINIC_OR_DEPARTMENT_OTHER)
Admission: EM | Admit: 2021-12-08 | Discharge: 2021-12-08 | Disposition: A | Discharge status: Home / Self Care | Payer: Medicare Other | Attending: Emergency Medicine | Admitting: Emergency Medicine

## 2021-12-08 DIAGNOSIS — Z043 Encounter for examination and observation following other accident: Secondary | ICD-10-CM | POA: Diagnosis not present

## 2021-12-08 DIAGNOSIS — S0181XA Laceration without foreign body of other part of head, initial encounter: Secondary | ICD-10-CM

## 2021-12-08 DIAGNOSIS — G20 Parkinson's disease: Secondary | ICD-10-CM | POA: Insufficient documentation

## 2021-12-08 DIAGNOSIS — W01198A Fall on same level from slipping, tripping and stumbling with subsequent striking against other object, initial encounter: Secondary | ICD-10-CM | POA: Diagnosis not present

## 2021-12-08 DIAGNOSIS — S01511A Laceration without foreign body of lip, initial encounter: Secondary | ICD-10-CM | POA: Insufficient documentation

## 2021-12-08 DIAGNOSIS — S022XXA Fracture of nasal bones, initial encounter for closed fracture: Secondary | ICD-10-CM | POA: Diagnosis not present

## 2021-12-08 DIAGNOSIS — Y9301 Activity, walking, marching and hiking: Secondary | ICD-10-CM | POA: Insufficient documentation

## 2021-12-08 DIAGNOSIS — S00501A Unspecified superficial injury of lip, initial encounter: Secondary | ICD-10-CM | POA: Diagnosis present

## 2021-12-08 DIAGNOSIS — W19XXXA Unspecified fall, initial encounter: Secondary | ICD-10-CM

## 2021-12-08 NOTE — Discharge Instructions (Signed)
You were seen today for laceration of the upper lip just below the nose.  This was repaired with surgical glue.  Your images were reassuring for no signs of acute intracranial injury or fractures.  Please allow the glue to fall off on its own over the next few weeks.  Do not try to peel the glue.  I recommend refraining from showering and getting the area wet until tomorrow afternoon.  Follow-up as needed with your primary care provider

## 2021-12-08 NOTE — ED Provider Notes (Signed)
[facility] EMERGENCY DEPT Provider Note   CSN: [id number] Arrival date & time: 12/08/21  1815     History  Chief Complaint  Patient presents with   Ronald Noble is a 75 y.o. male.  Patient presents to the hospital complaining of a lip injury after a fall.  Patient states that he was walking when his feet caught and he fell forward hitting his upper lip/philtrum.  There is a laceration to the area.  Bleeding is controlled at this time.  The patient denies losing consciousness either prior to or after the fall.  Denies severe neck pain.  Has full range of motion of his neck at this time.  The patient does not take any blood thinners.  Past medical history significant for Parkinson's disease, sleep apnea, BPH  HPI     Home Medications Prior to Admission medications   Medication Sig Start Date End Date Taking? Authorizing Provider  acetaminophen (TYLENOL) 325 MG tablet Take 2 tablets (650 mg total) by mouth every 6 (six) hours as needed. 04/01/21   Ronald Sparrow, DO  amantadine (SYMMETREL) 100 MG capsule Take 1 capsule (100 mg total) by mouth 2 (two) times daily. 04/10/21   Ronald Age, MD  atorvastatin (LIPITOR) 10 MG tablet Take 1 tablet (10 mg total) by mouth daily. Patient not taking: Reported on 10/20/2021 10/10/20   Ronald Dresser, MD  carbidopa-levodopa (SINEMET IR) 25-100 MG tablet TAKE 1 AND 1/2 TABLETS BY MOUTH 5 TIMES DAILY. 08/20/21   Ronald Age, MD  cholecalciferol (VITAMIN D3) 25 MCG (1000 UNIT) tablet Take 12,000 Units by mouth daily.    [provider]  clonazePAM (KLONOPIN) 0.25 MG disintegrating tablet DISSOLVE 2 TABLETS(0.5 MG) ON THE TONGUE AT BEDTIME 02/19/21   Ronald Age, MD  Coenzyme Q10 (COQ10) 200 MG CAPS Take 200 mg by mouth daily. Patient not taking: Reported on 10/20/2021 08/01/20   Ronald Dresser, MD  ibandronate (BONIVA) 150 MG tablet Take 150 mg by mouth every 30 (thirty) days. Take in the morning with a full glass  of water, on an empty stomach, and do not take anything else by mouth or lie down for the next 30 min.    [provider]  Melatonin 5 MG SUBL Place under the tongue.    [provider]  NEXLETOL 180 MG TABS Take 1 tablet by mouth daily. 07/05/21   [provider]  Ropinirole HCl 6 MG TB24 Take 1 tablet (6 mg total) by mouth daily. 03/17/21   Ronald Age, MD  sildenafil (REVATIO) 20 MG tablet Take 20 mg by mouth as needed.    [provider]      Allergies    Rosuvastatin    Review of Systems   Review of Systems  Musculoskeletal:  Negative for arthralgias, back pain, neck pain and neck stiffness.  Skin:  Positive for wound.    Physical Exam Updated Vital Signs BP (!) 162/71   Pulse 66   Temp (!) 97.5 F (36.4 C) (Oral)   Resp 16   Ht '5\' 11"'$  (1.803 m)   Wt 69.4 kg   SpO2 100%   BMI 21.34 kg/m  Physical Exam Vitals and nursing note reviewed.  Constitutional:      General: He is not in acute distress.    Appearance: He is well-developed.  HENT:     Head: Normocephalic.     Mouth/Throat:   Eyes:     Conjunctiva/sclera: Conjunctivae normal.  Cardiovascular:     Rate and Rhythm: Normal rate.  Pulmonary:     Effort: Pulmonary effort is normal.  Musculoskeletal:        General: No swelling or tenderness.     Cervical back: Neck supple.  Skin:    General: Skin is warm and dry.     Capillary Refill: Capillary refill takes less than 2 seconds.  Neurological:     Mental Status: He is alert and oriented to person, place, and time.  Psychiatric:        Mood and Affect: Mood normal.     ED Results / Procedures / Treatments   Labs (all labs ordered are listed, but only abnormal results are displayed) Labs Reviewed - No data to display  EKG None  Radiology CT Head Wo Contrast  Result Date: 12/08/2021 CLINICAL DATA:  Head trauma. Fall from standing, striking face on the corner of a desk with lip laceration. EXAM: CT HEAD WITHOUT  CONTRAST CT MAXILLOFACIAL WITHOUT CONTRAST CT CERVICAL SPINE WITHOUT CONTRAST TECHNIQUE: Multidetector CT imaging of the head, cervical spine, and maxillofacial structures were performed using the standard protocol without intravenous contrast. Multiplanar CT image reconstructions of the cervical spine and maxillofacial structures were also generated. RADIATION DOSE REDUCTION: This exam was performed according to the departmental dose-optimization program which includes automated exposure control, adjustment of the mA and/or kV according to patient size and/or use of iterative reconstruction technique. COMPARISON:  CT head, maxillofacial, and cervical spine 04/01/2021 FINDINGS: CT HEAD FINDINGS Brain: There is no evidence of an acute infarct, intracranial hemorrhage, mass, midline shift, or extra-axial fluid collection. Bilateral deep brain stimulators remain in place and appear unchanged in position. There is mild cerebral atrophy. Vascular: Calcified atherosclerosis at the skull base. No hyperdense vessel. Skull: No fracture or suspicious osseous lesion. Other: None. CT MAXILLOFACIAL FINDINGS Osseous: Remote nasal bone fractures. No acute fracture or mandibular dislocation. No destructive lesion. Orbits: Bilateral cataract extraction.  No acute traumatic finding. Sinuses: Unchanged minimal mucosal thickening or small mucous retention cyst in the alveolar recess of the right maxillary sinus. No sinus fluid. Clear mastoid air cells. Soft tissues: Upper lip swelling. CT CERVICAL SPINE FINDINGS Alignment: Unchanged minimal anterolisthesis of C3 on C4, C4 on C5, C7 on T1, and T1 on T2. Skull base and vertebrae: No acute fracture or suspicious osseous lesion. Unchanged T1 and T2 superior endplate Schmorl's nodes. Soft tissues and spinal canal: No prevertebral fluid or swelling. No visible canal hematoma. Disc levels: Similar appearance of cervical disc degeneration, most severe at C5-6 and C6-7, and of severe multilevel  facet arthrosis. Upper chest: No apical lung consolidation or mass. Other: None. IMPRESSION: No evidence of acute intracranial injury or acute maxillofacial or cervical spine fracture. Electronically Signed   By: Ronald Noble M.D.   On: 12/08/2021 19:37   CT Cervical Spine Wo Contrast  Result Date: 12/08/2021 CLINICAL DATA:  Head trauma. Fall from standing, striking face on the corner of a desk with lip laceration. EXAM: CT HEAD WITHOUT CONTRAST CT MAXILLOFACIAL WITHOUT CONTRAST CT CERVICAL SPINE WITHOUT CONTRAST TECHNIQUE: Multidetector CT imaging of the head, cervical spine, and maxillofacial structures were performed using the standard protocol without intravenous contrast. Multiplanar CT image reconstructions of the cervical spine and maxillofacial structures were also generated. RADIATION DOSE REDUCTION: This exam was performed according to the departmental dose-optimization program which includes automated exposure control, adjustment of the mA and/or kV according to patient size and/or use of iterative reconstruction technique. COMPARISON:  CT  head, maxillofacial, and cervical spine 04/01/2021 FINDINGS: CT HEAD FINDINGS Brain: There is no evidence of an acute infarct, intracranial hemorrhage, mass, midline shift, or extra-axial fluid collection. Bilateral deep brain stimulators remain in place and appear unchanged in position. There is mild cerebral atrophy. Vascular: Calcified atherosclerosis at the skull base. No hyperdense vessel. Skull: No fracture or suspicious osseous lesion. Other: None. CT MAXILLOFACIAL FINDINGS Osseous: Remote nasal bone fractures. No acute fracture or mandibular dislocation. No destructive lesion. Orbits: Bilateral cataract extraction.  No acute traumatic finding. Sinuses: Unchanged minimal mucosal thickening or small mucous retention cyst in the alveolar recess of the right maxillary sinus. No sinus fluid. Clear mastoid air cells. Soft tissues: Upper lip swelling. CT CERVICAL  SPINE FINDINGS Alignment: Unchanged minimal anterolisthesis of C3 on C4, C4 on C5, C7 on T1, and T1 on T2. Skull base and vertebrae: No acute fracture or suspicious osseous lesion. Unchanged T1 and T2 superior endplate Schmorl's nodes. Soft tissues and spinal canal: No prevertebral fluid or swelling. No visible canal hematoma. Disc levels: Similar appearance of cervical disc degeneration, most severe at C5-6 and C6-7, and of severe multilevel facet arthrosis. Upper chest: No apical lung consolidation or mass. Other: None. IMPRESSION: No evidence of acute intracranial injury or acute maxillofacial or cervical spine fracture. Electronically Signed   By: Ronald Noble M.D.   On: 12/08/2021 19:37   CT MAXILLOFACIAL WO CONTRAST  Result Date: 12/08/2021 CLINICAL DATA:  Head trauma. Fall from standing, striking face on the corner of a desk with lip laceration. EXAM: CT HEAD WITHOUT CONTRAST CT MAXILLOFACIAL WITHOUT CONTRAST CT CERVICAL SPINE WITHOUT CONTRAST TECHNIQUE: Multidetector CT imaging of the head, cervical spine, and maxillofacial structures were performed using the standard protocol without intravenous contrast. Multiplanar CT image reconstructions of the cervical spine and maxillofacial structures were also generated. RADIATION DOSE REDUCTION: This exam was performed according to the departmental dose-optimization program which includes automated exposure control, adjustment of the mA and/or kV according to patient size and/or use of iterative reconstruction technique. COMPARISON:  CT head, maxillofacial, and cervical spine 04/01/2021 FINDINGS: CT HEAD FINDINGS Brain: There is no evidence of an acute infarct, intracranial hemorrhage, mass, midline shift, or extra-axial fluid collection. Bilateral deep brain stimulators remain in place and appear unchanged in position. There is mild cerebral atrophy. Vascular: Calcified atherosclerosis at the skull base. No hyperdense vessel. Skull: No fracture or suspicious  osseous lesion. Other: None. CT MAXILLOFACIAL FINDINGS Osseous: Remote nasal bone fractures. No acute fracture or mandibular dislocation. No destructive lesion. Orbits: Bilateral cataract extraction.  No acute traumatic finding. Sinuses: Unchanged minimal mucosal thickening or small mucous retention cyst in the alveolar recess of the right maxillary sinus. No sinus fluid. Clear mastoid air cells. Soft tissues: Upper lip swelling. CT CERVICAL SPINE FINDINGS Alignment: Unchanged minimal anterolisthesis of C3 on C4, C4 on C5, C7 on T1, and T1 on T2. Skull base and vertebrae: No acute fracture or suspicious osseous lesion. Unchanged T1 and T2 superior endplate Schmorl's nodes. Soft tissues and spinal canal: No prevertebral fluid or swelling. No visible canal hematoma. Disc levels: Similar appearance of cervical disc degeneration, most severe at C5-6 and C6-7, and of severe multilevel facet arthrosis. Upper chest: No apical lung consolidation or mass. Other: None. IMPRESSION: No evidence of acute intracranial injury or acute maxillofacial or cervical spine fracture. Electronically Signed   By: Ronald Noble M.D.   On: 12/08/2021 19:37    Procedures .Marland KitchenLaceration Repair  Date/Time: 12/08/2021 7:19 PM  Performed by: Amie Critchley,  Casimer Leek, PA-C Authorized by: Dorothyann Peng, PA-C   Consent:    Consent obtained:  Verbal   Consent given by:  Patient   Risks, benefits, and alternatives were discussed: yes     Risks discussed:  Poor cosmetic result, pain, poor wound healing and infection   Alternatives discussed:  No treatment Universal protocol:    Procedure explained and questions answered to patient or proxy's satisfaction: yes     Relevant documents present and verified: yes     Imaging studies available: yes     Required blood products, implants, devices, and special equipment available: yes     Immediately prior to procedure, a time out was called: yes     Patient identity confirmed:  Verbally with  patient Anesthesia:    Anesthesia method:  None Laceration details:    Location:  Lip   Lip location:  Upper exterior lip   Length (cm):  1.5   Depth (mm):  3 Exploration:    Contaminated: no   Treatment:    Area cleansed with:  Saline   Amount of cleaning:  Standard Skin repair:    Repair method:  Tissue adhesive Approximation:    Approximation:  Close   Vermilion border well-aligned: Border not affected.   Repair type:    Repair type:  Simple Post-procedure details:    Dressing:  Open (no dressing)   Procedure completion:  Tolerated well, no immediate complications .Marland KitchenLaceration Repair  Date/Time: 12/08/2021 7:21 PM  Performed by: Dorothyann Peng, PA-C Authorized by: Dorothyann Peng, PA-C   Consent:    Consent obtained:  Verbal   Consent given by:  Patient   Risks, benefits, and alternatives were discussed: yes     Risks discussed:  Pain, need for additional repair, infection, poor cosmetic result and poor wound healing   Alternatives discussed:  No treatment Universal protocol:    Procedure explained and questions answered to patient or proxy's satisfaction: yes     Relevant documents present and verified: yes     Imaging studies available: yes     Required blood products, implants, devices, and special equipment available: yes     Site/side marked: yes     Immediately prior to procedure, a time out was called: yes     Patient identity confirmed:  Verbally with patient Anesthesia:    Anesthesia method:  None Laceration details:    Location:  Lip   Lip location:  Upper exterior lip   Length (cm):  0.5   Depth (mm):  3 Treatment:    Area cleansed with:  Saline   Amount of cleaning:  Standard Skin repair:    Repair method:  Tissue adhesive Approximation:    Approximation:  Close   Vermilion border well-aligned: Border not involved.   Repair type:    Repair type:  Simple Post-procedure details:    Dressing:  Open (no dressing)   Procedure completion:   Tolerated well, no immediate complications     Medications Ordered in ED Medications - No data to display  ED Course/ Medical Decision Making/ A&P                           Medical Decision Making Amount and/or Complexity of Data Reviewed Radiology: ordered.   Patient presents with chief complaint of facial laceration.  Patient did hit his head and differential would include intracranial abnormality, fracture, cervical spine fracture, cervical spine dislocation and others  I reviewed the patient's past medical history including neurology visits for Parkinson's disease  I ordered and interpreted imaging including CT scans of the head, face, and cervical spine.  No evidence of acute intracranial injury, acute maxillofacial injury, or cervical spine fracture.  I agree with the radiologist findings.  The patient's lacerations on his upper lip did not involve the vermilion border.  These were in the philtrum region.  The patient requested surgical glue which is reasonable based on the location of the injury.  See procedure notes above.  No sign of fracture, dislocation, intracranial abnormality that would require hospitalization.  Laceration repaired as requested with surgical glue.  Instructions provided for laceration care.  Patient and wife voiced understanding.  Discharge home.        Final Clinical Impression(s) / ED Diagnoses Final diagnoses:  Fall, initial encounter  Facial laceration, initial encounter    Rx / DC Orders ED Discharge Orders     None         Ronny Bacon 12/08/21 1956    Lennice Sites, DO 12/08/21 2251

## 2021-12-08 NOTE — ED Triage Notes (Signed)
Fall from standing, trip and fall, struck face on the corner of the desk, laceration to upper lip, negative loc.

## 2021-12-08 NOTE — Therapy (Signed)
OUTPATIENT OCCUPATIONAL THERAPY NEURO Treatment  Patient Name: Ronald Noble MRN: [id number] DOB:12/11/1945, 76 y.o., male Today's Date: 12/09/2021  PCP: Dr. Mark Perini REFERRING PROVIDER: Dr. Saima Athar   OT End of Session - 12/09/21 0810     Visit Number 11    Number of Visits 17    Date for OT Re-Evaluation 01/18/22    Authorization Type Medicare and BCBS--covered 100%    Authorization Time Period cert period 09/05/18-12/05/18    Authorization - Visit Number 11    Authorization - Number of Visits 20    Progress Note Due on Visit 20    OT Start Time 0805    OT Stop Time 0845    OT Time Calculation (min) 40 min    Activity Tolerance Patient tolerated treatment well    Behavior During Therapy WFL for tasks assessed/performed;Impulsive                   Past Medical History:  Diagnosis Date   BPH (benign prostatic hyperplasia)    COVID 04/24/2020   scratchy throat x 1 day all symptoms resolved   Parkinson disease (HCC)    Skin cancer of face    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   Sleep apnea    mild no cpap used   Urethral stricture    Past Surgical History:  Procedure Laterality Date   BURR HOLE W/ STEREOTACTIC INSERTION OF DBS LEADS / INTRAOP MICROELECTRODE RECORDING  04/2014   [facility]   COLONOSCOPY  2019   CYSTOSCOPY WITH URETHRAL DILATATION N/A 06/15/2019   Procedure: CYSTOSCOPY WITH URETHRAL DILATATION;  Surgeon: Dahlstedt, Stephen, MD;  Location: [facility];  Service: Urology;  Laterality: N/A;  30 MINS   CYSTOSCOPY WITH URETHRAL DILATATION N/A 05/09/2020   Procedure: CYSTOSCOPY WITH BALLOON DILATATION OF POSTERIOR URETHRAL STRICTURE, FLUOROSCOPIC INTERPRETATION;  Surgeon: Dahlstedt, Stephen, MD;  Location: [facility];  Service: Urology;  Laterality: N/A;   KNEE SURGERY  yrs ago   exploratory/left   SKIN CANCER EXCISION  01/2014   face   TRANSURETHRAL RESECTION OF PROSTATE      2008   Patient Active Problem List   Diagnosis Date Noted   Rib pain on right side 12/10/2020   Hyperlipidemia 10/11/2020   Bilateral olecranon bursitis 07/05/2019   Other bursitis of knee, left knee 12/06/2018   Pain in right leg 12/06/2018   Parkinson disease (HCC)     ONSET DATE: 10/02/21  (referral date, recommended from previous therapy d/c)  REFERRING DIAG: Parkinson's Disease   THERAPY DIAG:  Other lack of coordination  Abnormal posture  Unsteadiness on feet  Other symptoms and signs involving the nervous system  Other symptoms and signs involving the musculoskeletal system  Stiffness of right shoulder, not elsewhere classified  Attention and concentration deficit  Frontal lobe and executive function deficit  Rationale for Evaluation and Treatment Rehabilitation  SUBJECTIVE:   SUBJECTIVE STATEMENT: Pt fell yesterday and hit lip on corner of desk.  He went to the ED and they used glue for lip, but lip is quite swollen today.  No other injuries.  (Was wearing sandal and caught on edge of the rug)  Goes by "Ronald Noble"  Pt accompanied by: self  PERTINENT HISTORY: Parkinson's Disease s/p bilateral DBS  PMH:  hx of skin CA, sleep apnea, bilateral DBS   PRECAUTIONS: Fall and Other: Bilateral DBS  WEIGHT BEARING RESTRICTIONS No  PAIN:  Are you   having pain? No and Yes: NPRS scale: 2-3/10 Pain location: L shoulder Pain description: sharp at times Aggravating factors: raising arm Relieving factors: rest  FALLS: Has patient fallen in last 6 months? Yes. Number of falls 2 with ED visits and stumbles/multiple other falls  (1-2x/month)  LIVING ENVIRONMENT: Lives with: lives with their spouse  PLOF: Independent with basic ADLs, Vocation/Vocational requirements: retired in March 2023, and Leisure: [employer] 2x/wk, stretching/yoga (and therapy exercises), walk (range is limited approx 2 blocks--uses hiking poles)  PATIENT GOALS improve coordination,  ADLs  OBJECTIVE:   EVAL OBSERVATIONS: Pt reports facial dystonias associated with medications which make it difficult to communicate and difficult to keep eyes open.  Pt with hx of dystonia/dyskinesias depending on medication cycle.  Bradykinesia noted, impulsivity noted at times.   TODAY'S TREATMENT:   Supine, closed-chain shoulder flex, chest press with BUEs with ball with min cueing.  PWR! Up and scapular depression with min cueing.    In prone, PWR! Up with min cueing, followed by scapular retraction with shoulders in ext and abduction with min cueing for positioning.  Sitting, functional reaching laterally and across body incorporating wt. Shift and trunk rotation with each UE with min cueing/set-up for large amplitude movements (performed to each side).    HOME EXERCISE PROGRAM: Supine closed chain shoulder flexion and chest press with paper towel roll, min-mod v.c 11/18/21:  PWR! Moves (basic 4) in modified quadruped with emphasis and cueing on posture and shoulder positioning  11/25/21:  awareness of postural alignment and affect of functional activities as well recommendations for use of mirror as feedback  12/04/21:  Closed-chain shoulder flex with ball in front of mirror with focus on posture/alignment; Keeping Thinking Skills Sharp & Memory Compensation Strategies   GOALS: Potential Goals reviewed with patient? Yes  SHORT TERM GOALS: Target date: 11/20/21  Pt will be independent be updated PD-specific HEP. Goal status: IN PROGRESS.  Met with modified quadruped PWR!  2.  Pt will demo at least 135* R shoulder flex for functional reaching  Baseline:  125* Goal status: IN PROGRESS  11/27/21  130*   3.  Pt will write at least 4 sentences with good size/legibility. Goal status: MET  11/27/21:  met--no decr in size and improved legibility  4.  Pt will improve L hand coordination for ADLs as shown by improving time on 9-hole peg test by at least 5sec. Baseline:  47.09sec Goal  status: IN PROGRESS/Not consistent  11/27/21:  52.41, 42sec.    5.  Pt will verbalize understanding of memory/cognitive compensation strategies. Goal status: MET  11/27/21--not yet issued.  12/04/21:  met   LONG TERM GOALS: Target date: 01/19/22  Pt will be independent with strategies/AE for ADLs/IADLs.(Including strategy for donning/ doffing jacket) Goal status: IN PROGRESS  2.  Pt will improve L hand coordination for ADLs as shown by improving time on 9-hole peg test by at least 8sec. Baseline: 47.09sec Goal status: INITIAL  3.  Pt will improve bilateral functional reaching/coordination as shown by improving score on box and blocks test by at 4 blocks with LUE. Baseline: L-44 blocks Goal status: INITIAL  4.  Pt will improve bilateral functional reaching/coordination as shown by improving score on box and blocks test by at 4 blocks with RUE. Baseline:  R-47 blocks Goal status: INITIAL  5.  Pt will increase bilateral standing functional reach to 10 inches or greater to minimize fall risk. Baseline: R 9.5 L 8.5 Goal status: INITIAL  6.  Pt will   demonstrate improved ease with fastening buttons as evidenced by decreasing 3 button/ unbutton time to :49 secs or less. Baseline: 53.46 Goal status: INITIAL     ASSESSMENT:  CLINICAL IMPRESSION: Pt is progressing slowly towards goals.  Pt not performing as well today due to incr in rigidity of shoulders/posture, likely due to fall yesterday.   PERFORMANCE DEFICITS in functional skills including ADLs, IADLs, coordination, dexterity, tone, ROM, flexibility, FMC, GMC, mobility, balance, body mechanics, endurance, decreased knowledge of precautions, decreased knowledge of use of DME, and UE functional use, cognitive skills including attention, memory, perception, and safety awareness, and psychosocial skills including environmental adaptation and habits.   IMPAIRMENTS are limiting patient from ADLs, IADLs, and leisure.   COMORBIDITIES may  have co-morbidities  that affects occupational performance. Patient will benefit from skilled OT to address above impairments and improve overall function.  MODIFICATION OR ASSISTANCE TO COMPLETE EVALUATION: Min-Moderate modification of tasks or assist with assess necessary to complete an evaluation.  OT OCCUPATIONAL PROFILE AND HISTORY: Detailed assessment: Review of records and additional review of physical, cognitive, psychosocial history related to current functional performance.  CLINICAL DECISION MAKING: Moderate - several treatment options, min-mod task modification necessary  REHAB POTENTIAL: Good  EVALUATION COMPLEXITY: Moderate   PLAN: OT FREQUENCY: 2x/week  OT DURATION: 8 weeks +eval or 17 visits over 12 weeks for scheduling  PLANNED INTERVENTIONS: self care/ADL training, therapeutic exercise, therapeutic activity, neuromuscular re-education, manual therapy, passive range of motion, balance training, functional mobility training, aquatic therapy, splinting, ultrasound, paraffin, fluidotherapy, moist heat, cryotherapy, patient/family education, cognitive remediation/compensation, energy conservation, and DME and/or AE instructions  RECOMMENDED OTHER SERVICES: current with PT and ST (evals today)  CONSULTED AND AGREED WITH PLAN OF CARE: Patient  PLAN FOR NEXT SESSION:  ? re-apply kinesiotape, coordination with focus on posture,  ADL strategies, check box and blocks test, practice donning/doffing jacket   FREEMAN,ANGELA, OTR/L 12/09/2021, 8:10 AM [phone] Phone: [phone] 8:10 AM 12/09/21          

## 2021-12-09 ENCOUNTER — Encounter: Payer: Self-pay | Admitting: Occupational Therapy

## 2021-12-09 ENCOUNTER — Ambulatory Visit: Payer: Medicare Other | Admitting: Physical Therapy

## 2021-12-09 ENCOUNTER — Ambulatory Visit: Payer: Medicare Other | Attending: Orthopaedic Surgery | Admitting: Occupational Therapy

## 2021-12-09 DIAGNOSIS — R29898 Other symptoms and signs involving the musculoskeletal system: Secondary | ICD-10-CM | POA: Diagnosis not present

## 2021-12-09 DIAGNOSIS — R29818 Other symptoms and signs involving the nervous system: Secondary | ICD-10-CM | POA: Diagnosis not present

## 2021-12-09 DIAGNOSIS — R278 Other lack of coordination: Secondary | ICD-10-CM | POA: Diagnosis not present

## 2021-12-09 DIAGNOSIS — M25611 Stiffness of right shoulder, not elsewhere classified: Secondary | ICD-10-CM | POA: Diagnosis not present

## 2021-12-09 DIAGNOSIS — R41841 Cognitive communication deficit: Secondary | ICD-10-CM | POA: Diagnosis not present

## 2021-12-09 DIAGNOSIS — R2681 Unsteadiness on feet: Secondary | ICD-10-CM | POA: Diagnosis not present

## 2021-12-09 DIAGNOSIS — R41844 Frontal lobe and executive function deficit: Secondary | ICD-10-CM | POA: Insufficient documentation

## 2021-12-09 DIAGNOSIS — R2689 Other abnormalities of gait and mobility: Secondary | ICD-10-CM | POA: Insufficient documentation

## 2021-12-09 DIAGNOSIS — R41840 Attention and concentration deficit: Secondary | ICD-10-CM | POA: Insufficient documentation

## 2021-12-09 DIAGNOSIS — R293 Abnormal posture: Secondary | ICD-10-CM | POA: Diagnosis not present

## 2021-12-09 DIAGNOSIS — R471 Dysarthria and anarthria: Secondary | ICD-10-CM | POA: Insufficient documentation

## 2021-12-09 NOTE — Therapy (Signed)
OUTPATIENT PHYSICAL THERAPY NEURO TREATMENT   Patient Name: Ronald Noble MRN: [id number] DOB:1946/01/07, 76 y.o., male Today's Date: 12/09/2021   PCP: [name], MD REFERRING PROVIDER: Star Age, MD      PT End of Session - 12/09/21 0845     Visit Number 11    Number of Visits 17    Date for PT Re-Evaluation 01/18/22   Due to delay in scheduling   Authorization Type Medicare & BCBS    PT Start Time [phone]    PT Stop Time 0927    PT Time Calculation (min) 43 min    Equipment Utilized During Treatment Gait belt    Activity Tolerance Patient tolerated treatment well    Behavior During Therapy WFL for tasks assessed/performed                   Past Medical History:  Diagnosis Date   BPH (benign prostatic hyperplasia)    COVID 04/24/2020   scratchy throat x 1 day all symptoms resolved   Parkinson disease ([place])    Skin cancer of face    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   Sleep apnea    mild no cpap used   Urethral stricture    Past Surgical History:  Procedure Laterality Date   BURR HOLE W/ STEREOTACTIC INSERTION OF DBS LEADS / INTRAOP MICROELECTRODE RECORDING  04/2014   [facility] in [facility]   COLONOSCOPY  2019   CYSTOSCOPY WITH URETHRAL DILATATION N/A 06/15/2019   Procedure: CYSTOSCOPY WITH URETHRAL DILATATION;  Surgeon: Franchot Gallo, MD;  Location: [facility];  Service: Urology;  Laterality: N/A;  [place] N/A 05/09/2020   Procedure: CYSTOSCOPY WITH BALLOON DILATATION OF POSTERIOR URETHRAL STRICTURE, FLUOROSCOPIC INTERPRETATION;  Surgeon: Franchot Gallo, MD;  Location: [facility];  Service: Urology;  Laterality: N/A;   KNEE SURGERY  yrs ago   exploratory/left   SKIN CANCER EXCISION  01/2014   face   TRANSURETHRAL RESECTION OF PROSTATE     2008   Patient Active Problem List   Diagnosis Date Noted   Rib pain on right side 12/10/2020    Hyperlipidemia 10/11/2020   Bilateral olecranon bursitis 07/05/2019   Other bursitis of knee, left knee 12/06/2018   Pain in right leg 12/06/2018   Parkinson disease ([place])     ONSET DATE: 10/02/2021 (date of referral)  REFERRING DIAG: G20 (ICD-10-CM) - Parkinson's disease ([place])  THERAPY DIAG:  Unsteadiness on feet  Abnormal posture  Other lack of coordination  Rationale for Evaluation and Treatment Rehabilitation  SUBJECTIVE:  SUBJECTIVE STATEMENT: Pt reports he fell yesterday while walking inside home. Was wearing open-toe sandals and was "going too fast", caught his R foot on ground and fell forward onto desk, busting his lip. No other injuries. Has not obtained form to obtain U-step walker yet. "The walker is a crutch"   Pt accompanied by: self  PERTINENT HISTORY: PD 2008, bilateral DBS (2018); hyperlipidemia   Pt with facial dystonias/dyskinesias with his medications that make it difficult to communicate and keep his eyes open (happens approx. 45 minutes after taking meds)  PAIN:  Are you having pain? Yes: NPRS scale: 4-6/10 Pain location: L shoulder Pain description: Achy *reports soreness up posterior neck/occiput where DBS line is   PRECAUTIONS: Fall Bilateral DBS, No driving, freezing/festination episodes  FALLS: Has patient fallen in last 6 months? Yes. Number of falls Yes. Number of falls 2 with ED visits and stumbles/multiple other falls  (1-2x/month)   PLOF: Independent with basic ADLs, Independent with household mobility without device, and Independent with community mobility with device Independent with basic ADLs, Vocation/Vocational requirements: retired in March 2023, and Leisure: [employer] 2x/wk, stretching/yoga (and therapy exercises), walk (range is limited approx 2  blocks--uses hiking poles)  PATIENT GOALS Wants to work on slowing down.   OBJECTIVE:  TODAY'S TREATMENT:  Gait Training  Gait pattern: step through pattern, decreased step length- Right, decreased stride length, decreased hip/knee flexion- Right, decreased ankle dorsiflexion- Right, lateral lean- Right, trunk flexed, narrow BOS, and poor foot clearance- Right Distance walked: 230' indoors and >1,000' on unlevel surfaces outdoors  Assistive device utilized:  U-step walker Level of assistance: CGA and Min A Comments: Practiced proper use and management of U-step walker indoors and outdoors. Pt demonstrates poor AD management w/turns on on declines as he tends to step outside of walker and demonstrate significant forward lean, requiring min A to slow down and maintain safe distance to walker. Mod multimodal cues provided for safety w/turns and emphasis on staying inside walker at all times. Pt verbalized and demonstrated difficulty w/managing brakes and slowing down when walker was pushed too far anteriorly, requiring external assistance to slow down. Noted improved midline orientation in sagittal plane w/use of AD. Continue to encourage pt to obtain form to obtain U-step walker and education on how use of an AD is to prolong independence and safety at home rather than "be a crutch". Pt verbalized understanding. Pt would benefit from continued practice w/use of AD for improved safety w/ambulation and functional mobility.       GAIT: Gait pattern: step through pattern, decreased arm swing- Right, decreased arm swing- Left, decreased stride length, lateral lean- Right, decreased trunk rotation, trunk flexed, and narrow BOS Distance walked: Various clinic distances  Assistive device utilized: None Level of assistance: Supervision.  Comments: Pt did not bring in any AD into session   Ther Ex  SciFit ramp level 8 for 8 minutes using BUE/BLEs for neural priming for reciprocal movement, dynamic  cardiovascular conditioning and increased amplitude of stepping. RPE of 6/10 following activity.    PATIENT EDUCATION: Education details: Continue HEP. See gait training above, continued emphasis on importance of obtaining U-step walker for safety w/mobility  Person educated: Patient Education method: Explanation Education comprehension: verbalized understanding and needs further education   HOME EXERCISE PROGRAM: From previous bout of therapy: Seated and standing PWR Up, Quadruped PWR moves on floor, [place]  Access Code: [email or web address] URL: [email or web address] Date: 10/27/2021 Prepared by: Mickie Bail Kenedy Haisley  Exercises - Standing on one leg in  corner   - 1 x daily - 7 x weekly - 1 sets - 3 reps - 30-45 second hold - Sit to stand with band pull-apart   - 1 x daily - 7 x weekly - 3 sets - 10 reps    GOALS: Goals reviewed with patient? Yes  SHORT TERM GOALS: Target date: 11/18/2021  MiniBEST to finish being assessed with LTG written. Baseline: 20/28 Goal status: MET  2.  Pt will perform at least 8 of 10 reps, minimal to no UE support, with NO retropulsion or posterior lean for imrpoved trasnfer safety and efficiency.  Baseline:  Goal status: MET   LONG TERM GOALS: Target date: 12/16/2021  Pt will be independent with final HEP for improved strength, balance, transfers, and gait.   Baseline:  Goal status: INITIAL  2.  Patient will verbalize fall prevention strategies in the home, including tips to help with festination episodes in order to decrease risk of future falls.  Baseline:  Goal status: INITIAL  3. Pt will improve MiniBest to 24/28 for decreased fall risk and improvement with compensatory stepping strategies.   Baseline: 20/28 Goal status: REVISED  4.  Pt will demonstrate appropriate use of LRAD for indoor and household activities, to assist with gait safety, at least 200 ft with supervision.  Baseline:  Goal status: INITIAL  5.  Pt will perform at  least 10 reps with minimal to no UE support from lower/soft surfaces with supervision and no instances of retropulsion in order to demo improvement getting up from the couch. Baseline:  Goal status: INITIAL   ASSESSMENT:  CLINICAL IMPRESSION: Emphasis of skilled PT session on gait training w/U-step walker. Pt requires mod-max multimodal cues and min A for safe management at this time due to incr forward lean, decreased step clearance of RLE and impulsive variability w/gait speed. Pt demonstrated improved safety w/AD w/practice and visual demonstration for safe AD management, specifically w/turns. Pt not in agreement that he needs AD at this time but verbalized understanding to therapist's safety concerns. Continue POC.    PERSONAL FACTORS Behavior pattern, Past/current experiences, Time since onset of injury/illness/exacerbation, and 3+ comorbidities: PD 2008, bilateral DBS (2018); hyperlipidemia, dystonia/dyskinesias  are also affecting patient's functional outcome.    PLAN: PT FREQUENCY: 2x/week  PT DURATION: 12 weeks  PLANNED INTERVENTIONS: Therapeutic exercises, Therapeutic activity, Neuromuscular re-education, Balance training, Gait training, Patient/Family education, Self Care, Stair training, and DME instructions  PLAN FOR NEXT SESSION:   Continue gait training w/U-step (obstacles, turns, inclines/declines). Pt still needs to fill out form to obtain U-step. Work on sit <> stands with incr forward lean. Stepping/foot clearance tasks. SLS for balance. Unlevel surfaces. Work on posture and midline orientation. Wider BOS.   Cruzita Lederer Shyane Fossum, PT, DPT 12/09/2021, 9:30 AM

## 2021-12-11 ENCOUNTER — Encounter: Payer: Self-pay | Admitting: Physical Therapy

## 2021-12-11 ENCOUNTER — Ambulatory Visit: Payer: Medicare Other | Admitting: Speech Pathology

## 2021-12-11 ENCOUNTER — Ambulatory Visit: Payer: Medicare Other | Admitting: Physical Therapy

## 2021-12-11 DIAGNOSIS — R2681 Unsteadiness on feet: Secondary | ICD-10-CM | POA: Diagnosis not present

## 2021-12-11 DIAGNOSIS — R293 Abnormal posture: Secondary | ICD-10-CM | POA: Diagnosis not present

## 2021-12-11 DIAGNOSIS — M25611 Stiffness of right shoulder, not elsewhere classified: Secondary | ICD-10-CM | POA: Diagnosis not present

## 2021-12-11 DIAGNOSIS — R29818 Other symptoms and signs involving the nervous system: Secondary | ICD-10-CM

## 2021-12-11 DIAGNOSIS — R471 Dysarthria and anarthria: Secondary | ICD-10-CM

## 2021-12-11 DIAGNOSIS — R29898 Other symptoms and signs involving the musculoskeletal system: Secondary | ICD-10-CM | POA: Diagnosis not present

## 2021-12-11 DIAGNOSIS — R278 Other lack of coordination: Secondary | ICD-10-CM | POA: Diagnosis not present

## 2021-12-11 DIAGNOSIS — R41841 Cognitive communication deficit: Secondary | ICD-10-CM

## 2021-12-11 NOTE — Therapy (Signed)
OUTPATIENT SPEECH LANGUAGE PATHOLOGY TREATMENT NOTE   Patient Name: Ronald Noble MRN: [id number] DOB:[birth year], 76 y.o., male Today's Date: 12/11/2021  PCP: Ronald Infante, MD REFERRING PROVIDER: Star Age, MD  END OF SESSION:   End of Session - 12/11/21 1447     Visit Number 9    Number of Visits 17    Date for SLP Re-Evaluation 12/19/21    Authorization Type Medicare/BCBS    SLP Start Time 1445    SLP Stop Time  1526    SLP Time Calculation (min) 41 min    Activity Tolerance Patient tolerated treatment well                   Past Medical History:  Diagnosis Date   BPH (benign prostatic hyperplasia)    COVID 04/24/2020   scratchy throat x 1 day all symptoms resolved   Parkinson disease ([place])    Skin cancer of face    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   Sleep apnea    mild no cpap used   Urethral stricture    Past Surgical History:  Procedure Laterality Date   BURR HOLE W/ STEREOTACTIC INSERTION OF DBS LEADS / INTRAOP MICROELECTRODE RECORDING  04/2014   [facility] in [facility]   COLONOSCOPY  2019   CYSTOSCOPY WITH URETHRAL DILATATION N/A 06/15/2019   Procedure: CYSTOSCOPY WITH URETHRAL DILATATION;  Surgeon: Ronald Gallo, MD;  Location: [facility];  Service: Urology;  Laterality: N/A;  [place] N/A 05/09/2020   Procedure: CYSTOSCOPY WITH BALLOON DILATATION OF POSTERIOR URETHRAL STRICTURE, FLUOROSCOPIC INTERPRETATION;  Surgeon: Ronald Gallo, MD;  Location: [facility];  Service: Urology;  Laterality: N/A;   KNEE SURGERY  yrs ago   exploratory/left   SKIN CANCER EXCISION  01/2014   face   TRANSURETHRAL RESECTION OF PROSTATE     2008   Patient Active Problem List   Diagnosis Date Noted   Rib pain on right side 12/10/2020   Hyperlipidemia 10/11/2020   Bilateral olecranon bursitis 07/05/2019   Other bursitis of knee, left knee 12/06/2018   Pain  in right leg 12/06/2018   Parkinson disease ([place])     ONSET DATE: PD dx 2008; 10/02/2021 (referral)  REFERRING DIAG: (ICD-10-CM) - Dysarthria and anarthria R26.81  THERAPY DIAG:  Cognitive communication deficit  Dysarthria and anarthria  Rationale for Evaluation and Treatment Rehabilitation  SUBJECTIVE: Reports to intermittent compliance with HEP.   PAIN:  Are you having pain? No   OBJECTIVE:  TODAY'S TREATMENT:  12-11-21: Pt with facial dyskinesias upon arrival to [place], reports interferes with speech but is still amenable to participation in today's session. Pt has completed 1 lesson since previous ST session. Tells SLP he must have done previously targeted lessons. Target improving vocal quality and increasing intensity through progressively difficulty speech tasks using Speak Out! program, lesson 16. Pt does not required modeling this date and only rare min-A for accurate execution of tasks. Averages this date: loud "ah" 87 dB; counting 79 dB; reading 74 dB; cognitive speech task highly variable, averages 68 dB (ranges from 61-74 dB). Pt with increasing awareness of decaying volume during cognitive task. Endorses difficulty with generalization of intentional speech to conversations. Advised to begin defining opportunities to practice and being intentional about speech during that defied time. Generates x3 ideas to include car ride with wife, dinner, and vollyball game. Reports ongoing use of alarm for medication administration.  PATIENT EDUCATION: Education details: see above Person educated: Patient Education method: Explanation, Demonstration, and Handouts Education comprehension: verbalized understanding, returned demonstration, and needs further education     HOME EXERCISE PROGRAM: Speak Out!     GOALS: Goals reviewed with patient? Yes   SHORT TERM GOALS: Target date: 11/17/2021     Pt will complete Speak Out! HEP at least 1x/day given occasional min A over 2 sessions   Baseline: Goal status: ongoing   2.  Pt will achieve targeted dB (85-90 dB) on warm up exercises with 80% accuracy given occasional min A over 2 sessions  Baseline:  11-07-21, 11-12-21 Goal status: Met   3.  Pt will achieve targeted dB (75-85 dB) on reading exercises with 80% accuracy given occasional min A over 2 sessions Baseline: 11-07-21, 11-12-21 Goal status: Met   4.  Pt will achieve targeted dB (72-78 dB) on cognitive exercises with 80% accuracy given occasional min A over 2 sessions Baseline: 11-07-21, 11-12-21 Goal status: Met   5.  Pt will utilize dysarthria compensations in 5-10 minute conversation to optimize vocal intensity and clarity given occasional min A over 2 sessions  Baseline: 11-07-21, 11-25-21 Goal status: Met   6.  Pt will utilize internal/external memory and attention aids to optimize medication management and completion of daily tasks given occasional min A over 2 sessions  Baseline: 11-25-21 Goal status: Partially met   LONG TERM GOALS: Target date: 12/19/2021     Pt will complete Speak Out! HEP at least 1x/day (BID recommended) > 1 week  Baseline:  Goal status: ongoing   2.  Pt will achieve targeted dB levels in demonstration of Speak Out! Lessons with 90% accuracy given rare min A over 2 sessions  Baseline:  Goal status: ongoing   3.  Pt will utilize dysarthria compensations in 15+ minute conversation to optimize vocal intensity and clarity given rare min A over 2 sessions Baseline:  Goal status: ongoing   4.  Pt will utilize internal/external memory and attention aids to optimize medication management and completion of daily tasks given rare min A over 2 sessions  Baseline: 12/11/21 Goal status: ongoing   5.  Pt will report improved cognitive communication via PROM by 2 point improvements by last ST session  Baseline: CF=84; CPIB=5 Goal status: ongoing       ASSESSMENT:   CLINICAL IMPRESSION: "Ronald Noble" is a 76 y.o. male who was seen today for dysarthria and  cognitive changes related to progressive Parkinson's Disease. Conducted ongoing education and instruction of Speak Out! Program and principles to maximize vocal intensity and clarity secondary to PD related speech changes. Pt benefited from repetition of information to aid recall and accurate completion. Given change in cognition and decline in speech intelligibility, skilled ST intervention is recommended to optimize communication effectiveness as well as increase functional independence and safety through use of cognitive compensatory training.    OBJECTIVE IMPAIRMENTS  Objective impairments include attention, memory, executive functioning, and dysarthria. These impairments are limiting patient from managing medications, household responsibilities, and effectively communicating at home and in community.Factors affecting potential to achieve goals and functional outcome are ability to learn/carryover information, cooperation/participation level, and medical prognosis. Patient will benefit from skilled SLP services to address above impairments and improve overall function.   REHAB POTENTIAL: Fair - hx of reduced HEP compliance    PLAN: SLP FREQUENCY: 2x/week   SLP DURATION: 8 weeks   PLANNED INTERVENTIONS: Cueing hierachy, Cognitive reorganization, Internal/external aids, Functional tasks, Multimodal communication approach, SLP  instruction and feedback, Compensatory strategies, and Patient/family education   Su Monks, [place] 12/11/2021, 3:21 PM

## 2021-12-11 NOTE — Therapy (Signed)
OUTPATIENT PHYSICAL THERAPY NEURO TREATMENT   Patient Name: Ronald Noble MRN: [id number] DOB:10-Jun-1945, 76 y.o., male Today's Date: 12/11/2021   PCP: [name], MD REFERRING PROVIDER: Star Age, MD      PT End of Session - 12/11/21 1405     Visit Number 12    Number of Visits 17    Date for PT Re-Evaluation 01/18/22   Due to delay in scheduling   Authorization Type Medicare & BCBS    PT Start Time 1403    PT Stop Time 1443    PT Time Calculation (min) 40 min    Equipment Utilized During Treatment Gait belt    Activity Tolerance Patient tolerated treatment well    Behavior During Therapy WFL for tasks assessed/performed                   Past Medical History:  Diagnosis Date   BPH (benign prostatic hyperplasia)    COVID 04/24/2020   scratchy throat x 1 day all symptoms resolved   Parkinson disease ([place])    Skin cancer of face    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   Sleep apnea    mild no cpap used   Urethral stricture    Past Surgical History:  Procedure Laterality Date   BURR HOLE W/ STEREOTACTIC INSERTION OF DBS LEADS / INTRAOP MICROELECTRODE RECORDING  04/2014   [facility] in [facility]   COLONOSCOPY  2019   CYSTOSCOPY WITH URETHRAL DILATATION N/A 06/15/2019   Procedure: CYSTOSCOPY WITH URETHRAL DILATATION;  Surgeon: Franchot Gallo, MD;  Location: [facility];  Service: Urology;  Laterality: N/A;  [place] N/A 05/09/2020   Procedure: CYSTOSCOPY WITH BALLOON DILATATION OF POSTERIOR URETHRAL STRICTURE, FLUOROSCOPIC INTERPRETATION;  Surgeon: Franchot Gallo, MD;  Location: [facility];  Service: Urology;  Laterality: N/A;   KNEE SURGERY  yrs ago   exploratory/left   SKIN CANCER EXCISION  01/2014   face   TRANSURETHRAL RESECTION OF PROSTATE     2008   Patient Active Problem List   Diagnosis Date Noted   Rib pain on right side 12/10/2020    Hyperlipidemia 10/11/2020   Bilateral olecranon bursitis 07/05/2019   Other bursitis of knee, left knee 12/06/2018   Pain in right leg 12/06/2018   Parkinson disease ([place])     ONSET DATE: 10/02/2021 (date of referral)  REFERRING DIAG: G20 (ICD-10-CM) - Parkinson's disease ([place])  THERAPY DIAG:  Unsteadiness on feet  Abnormal posture  Other symptoms and signs involving the nervous system  Rationale for Evaluation and Treatment Rehabilitation  SUBJECTIVE:  SUBJECTIVE STATEMENT: Reports that his dyskinesias are at their worst today. Brought in [facility].   Pt accompanied by: self  PERTINENT HISTORY: PD 2008, bilateral DBS (2018); hyperlipidemia   Pt with facial dystonias/dyskinesias with his medications that make it difficult to communicate and keep his eyes open (happens approx. 45 minutes after taking meds)  PAIN:  Are you having pain? Yes: NPRS scale: 4-6/10 Pain location: L shoulder Pain description: Achy *reports soreness up posterior neck/occiput where DBS line is   PRECAUTIONS: Fall Bilateral DBS, No driving, freezing/festination episodes  FALLS: Has patient fallen in last 6 months? Yes. Number of falls Yes. Number of falls 2 with ED visits and stumbles/multiple other falls  (1-2x/month)   PLOF: Independent with basic ADLs, Independent with household mobility without device, and Independent with community mobility with device Independent with basic ADLs, Vocation/Vocational requirements: retired in March 2023, and Leisure: [employer] 2x/wk, stretching/yoga (and therapy exercises), walk (range is limited approx 2 blocks--uses hiking poles)  PATIENT GOALS Wants to work on slowing down.   OBJECTIVE:  TODAY'S TREATMENT:  Gait Training  Gait pattern: step through pattern, decreased step  length- Right, decreased stride length, decreased hip/knee flexion- Right, decreased ankle dorsiflexion- Right, lateral lean- Right, trunk flexed, narrow BOS, and poor foot clearance- Right Distance walked: 345' x 1, 230' x1 and additional distances ambulating in and out of clinic with cane.  Assistive device utilized:  U-step walker during session, in and out of clinic with SPC  Level of assistance: SBA with use of U-Step Walker, CGA/min A with use of SPC.  Comments: Pt with significant facial dyskinesias today and having his eyes closed at times during gait. Pt ambulated in to clinic with [facility] and therapist needing to provide CGA/min A for safety walking into clinic and down to speech therapy. Cues for slowed pace. Continued to practice with use of U-Step Rollator with cues for posture foot clearance and keeping feet under the seat. Intermittent cues to stay closer to the rollator, but pt seemed to do better today than previous session. Pt only needing supervision with U-Step Rollator. Continued to encourage pt to obtain U-Step Rollator for safety (pt still has to fill out form). Pt verbalizes that he does feel more steady with the Rollator when he has his dyskinesias.    Practiced gait with U-Step Rollator going around 6 obstacles around 30' - cues for staying closer to rollator and widening BOS when going around the cones, pt able to perform with supervision.   NMR Use of mirror in front of patient for visual feedback for postural awareness:   Standing on air ex, performed PWR Up Flow,  Seated PWR Up > Stand > PWR Up > Sit x8 reps total.  Cues for holding posture for 5 seconds. Initial cues for incr forward lean for sit to stand. Focus on eccentric control when lowering back to mat table.   On air ex at edge of mat with chair in front for balance: -A/P weight shifting from heel to toe x15 reps -Lateral weight shifting x15 reps, cues for gentle heel lift  -With feet apart; head turns x10 reps,  head nods x10 reps (performed with eyes closed due to significant dyskinesias) Cued to try to keep posture in midline.   Due to significant facial dyskinesias during session today, pt unable to keep his eyes open for majority of exercises.   PATIENT EDUCATION: Education details: Continued emphasis on importance of obtaining U-step walker for safety w/mobility  Person educated:  Patient Education method: Explanation Education comprehension: verbalized understanding and needs further education   HOME EXERCISE PROGRAM: From previous bout of therapy: Seated and standing PWR Up, Quadruped PWR moves on floor, [email or web address]  Access Code: [email or web address] URL: [email or web address] Date: 10/27/2021 Prepared by: Mickie Bail Plaster  Exercises - Standing on one leg in corner   - 1 x daily - 7 x weekly - 1 sets - 3 reps - 30-45 second hold - Sit to stand with band pull-apart   - 1 x daily - 7 x weekly - 3 sets - 10 reps    GOALS: Goals reviewed with patient? Yes  SHORT TERM GOALS: Target date: 11/18/2021  MiniBEST to finish being assessed with LTG written. Baseline: 20/28 Goal status: MET  2.  Pt will perform at least 8 of 10 reps, minimal to no UE support, with NO retropulsion or posterior lean for imrpoved trasnfer safety and efficiency.  Baseline:  Goal status: MET   LONG TERM GOALS: Target date: 12/16/2021  Pt will be independent with final HEP for improved strength, balance, transfers, and gait.   Baseline:  Goal status: INITIAL  2.  Patient will verbalize fall prevention strategies in the home, including tips to help with festination episodes in order to decrease risk of future falls.  Baseline:  Goal status: INITIAL  3. Pt will improve MiniBest to 24/28 for decreased fall risk and improvement with compensatory stepping strategies.   Baseline: 20/28 Goal status: REVISED  4.  Pt will demonstrate appropriate use of LRAD for indoor and household activities, to assist with gait  safety, at least 200 ft with supervision.  Baseline:  Goal status: INITIAL  5.  Pt will perform at least 10 reps with minimal to no UE support from lower/soft surfaces with supervision and no instances of retropulsion in order to demo improvement getting up from the couch. Baseline:  Goal status: INITIAL   ASSESSMENT:  CLINICAL IMPRESSION: Pt with significant facial dyskinesias during session today due to timing with his medications and it is worse later in the day. Continued to practice gait training with U-Step Rollator with pt needing only supervision with pt demonstrating improved posture and foot clearance. Continued to educate pt on safety with this AD, esp when pt has his episodes of dyskinesias as he is more stable with this vs. SPC. Remainder of session limited to what pt could perform as he had trouble keeping his eyes open due to dyskinesias. Will continue to progress towards LTGs.    PERSONAL FACTORS Behavior pattern, Past/current experiences, Time since onset of injury/illness/exacerbation, and 3+ comorbidities: PD 2008, bilateral DBS (2018); hyperlipidemia, dystonia/dyskinesias  are also affecting patient's functional outcome.    PLAN: PT FREQUENCY: 2x/week  PT DURATION: 12 weeks  PLANNED INTERVENTIONS: Therapeutic exercises, Therapeutic activity, Neuromuscular re-education, Balance training, Gait training, Patient/Family education, Self Care, Stair training, and DME instructions  PLAN FOR NEXT SESSION:   Continue gait training w/U-step (obstacles, turns, inclines/declines). Pt still needs to fill out form to obtain U-step. Work on sit <> stands with incr forward lean. Stepping/foot clearance tasks. SLS for balance. Unlevel surfaces. Work on posture and midline orientation. Wider BOS.   Arliss Journey, PT, DPT 12/11/2021, 4:52 PM

## 2021-12-15 ENCOUNTER — Ambulatory Visit: Payer: Medicare Other | Admitting: Speech Pathology

## 2021-12-15 ENCOUNTER — Ambulatory Visit: Payer: Medicare Other | Admitting: Physical Therapy

## 2021-12-15 ENCOUNTER — Encounter: Payer: Self-pay | Admitting: Speech Pathology

## 2021-12-15 DIAGNOSIS — R29818 Other symptoms and signs involving the nervous system: Secondary | ICD-10-CM | POA: Diagnosis not present

## 2021-12-15 DIAGNOSIS — R2681 Unsteadiness on feet: Secondary | ICD-10-CM

## 2021-12-15 DIAGNOSIS — R278 Other lack of coordination: Secondary | ICD-10-CM | POA: Diagnosis not present

## 2021-12-15 DIAGNOSIS — R293 Abnormal posture: Secondary | ICD-10-CM | POA: Diagnosis not present

## 2021-12-15 DIAGNOSIS — R29898 Other symptoms and signs involving the musculoskeletal system: Secondary | ICD-10-CM | POA: Diagnosis not present

## 2021-12-15 DIAGNOSIS — R41841 Cognitive communication deficit: Secondary | ICD-10-CM

## 2021-12-15 DIAGNOSIS — R471 Dysarthria and anarthria: Secondary | ICD-10-CM

## 2021-12-15 DIAGNOSIS — M25611 Stiffness of right shoulder, not elsewhere classified: Secondary | ICD-10-CM | POA: Diagnosis not present

## 2021-12-15 NOTE — Therapy (Signed)
OUTPATIENT PHYSICAL THERAPY NEURO TREATMENT- RECERTIFICATION   Patient Name: Rushil Kimbrell MRN: [id number] DOB:Aug 11, 1945, 76 y.o., male Today's Date: 12/15/2021   PCP: [name], MD REFERRING PROVIDER: Star Age, MD      PT End of Session - 12/15/21 1232     Visit Number 13    Number of Visits 25   Recert   Date for PT Re-Evaluation 08/81/10   Recert   Authorization Type Medicare & BCBS    PT Start Time 1230    PT Stop Time 1314    PT Time Calculation (min) 44 min    Equipment Utilized During Treatment Gait belt    Activity Tolerance Patient tolerated treatment well    Behavior During Therapy WFL for tasks assessed/performed;Impulsive                    Past Medical History:  Diagnosis Date   BPH (benign prostatic hyperplasia)    COVID 04/24/2020   scratchy throat x 1 day all symptoms resolved   Parkinson disease ([place])    Skin cancer of face    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   Sleep apnea    mild no cpap used   Urethral stricture    Past Surgical History:  Procedure Laterality Date   BURR HOLE W/ STEREOTACTIC INSERTION OF DBS LEADS / INTRAOP MICROELECTRODE RECORDING  04/2014   [facility] in [facility]   COLONOSCOPY  2019   [place] N/A 06/15/2019   Procedure: CYSTOSCOPY WITH URETHRAL DILATATION;  Surgeon: Franchot Gallo, MD;  Location: [facility];  Service: Urology;  Laterality: N/A;  [place] N/A 05/09/2020   Procedure: CYSTOSCOPY WITH BALLOON DILATATION OF POSTERIOR URETHRAL STRICTURE, FLUOROSCOPIC INTERPRETATION;  Surgeon: Franchot Gallo, MD;  Location: [facility];  Service: Urology;  Laterality: N/A;   KNEE SURGERY  yrs ago   exploratory/left   SKIN CANCER EXCISION  01/2014   face   TRANSURETHRAL RESECTION OF PROSTATE     2008   Patient Active Problem List   Diagnosis Date Noted   Rib pain on right  side 12/10/2020   Hyperlipidemia 10/11/2020   Bilateral olecranon bursitis 07/05/2019   Other bursitis of knee, left knee 12/06/2018   Pain in right leg 12/06/2018   Parkinson disease ([place])     ONSET DATE: 10/02/2021 (date of referral)  REFERRING DIAG: G20 (ICD-10-CM) - Parkinson's disease ([place])  THERAPY DIAG:  Unsteadiness on feet - Plan: PT plan of care cert/re-cert  Abnormal posture - Plan: PT plan of care cert/re-cert  Other lack of coordination - Plan: PT plan of care cert/re-cert  Rationale for Evaluation and Treatment Rehabilitation  SUBJECTIVE:  SUBJECTIVE STATEMENT: Pt ambulated into clinic w/SPC. Reports he did a lot of yard work this weekend with no issues. No new falls or near-misses.   Pt accompanied by: self  PERTINENT HISTORY: PD 2008, bilateral DBS (2018); hyperlipidemia   Pt with facial dystonias/dyskinesias with his medications that make it difficult to communicate and keep his eyes open (happens approx. 45 minutes after taking meds)  PAIN:  Are you having pain? Yes: NPRS scale: 2-3/10 Pain location: L shoulder Pain description: Achy *reports soreness up posterior neck/occiput where DBS line is   PRECAUTIONS: Fall Bilateral DBS, No driving, freezing/festination episodes  FALLS: Has patient fallen in last 6 months? Yes. Number of falls Yes. Number of falls 2 with ED visits and stumbles/multiple other falls  (1-2x/month)   PLOF: Independent with basic ADLs, Independent with household mobility without device, and Independent with community mobility with device Independent with basic ADLs, Vocation/Vocational requirements: retired in March 2023, and Leisure: [employer] 2x/wk, stretching/yoga (and therapy exercises), walk (range is limited approx 2 blocks--uses hiking  poles)  PATIENT GOALS Wants to work on slowing down.   OBJECTIVE:  TODAY'S TREATMENT:  Ther Act  LTG Assessment  Discussed fall prevention strategies in the home and pt able to verbalize the following:  Reducing trip hazards (throw rugs, shoes in floor, etc.) Having well-lit areas at all times, especially at night  Freezing techniques when walking through narrow spaces  Using the rail when navigating stairs  Having a shower chair and plan to install grab bars in shower  Discussed need to use AD at all times, which pt is not compliant with. Pt verbalized understanding  Pt performed 10 sit <>stands from low, plush mat without UE support and no retropulsion. Pt performed PWR Up posture without cues.  Pt's dyskinesias began during beginning of MiniBest assessment, so deferred test until next session due to pt's difficulty keeping eyes open and communicating when dyskinesias occur Discussed POC moving forward and pt reports he would prefer to continue w/PT until beginning of October. "The longer I stay out of the hospital, the better". Pt reports he has called his insurance company to start process of obtaining U-step walker but has not heard back.    [facility] PT Assessment - 12/15/21 1250       Mini-BESTest   Sit To Stand Normal: Comes to stand without use of hands and stabilizes independently.    Rise to Toes Moderate: Heels up, but not full range (smaller than when holding hands), OR noticeable instability for 3 s.    Stand on one leg (left) Moderate: < 20 s   <1s   Stand on one leg (right) Moderate: < 20 s   <2s   Stand on one leg - lowest score 1              Ther Ex   SciFit multi-peaks level 14 for 8 minutes using BUE/BLEs for neural priming for reciprocal movement, dynamic cardiovascular conditioning and increased amplitude of stepping. RPE of 7/10 following activity    Due to significant facial dyskinesias during session today, pt unable to keep his eyes open.   PATIENT  EDUCATION: Education details: See above  Person educated: Patient Education method: Explanation Education comprehension: verbalized understanding and needs further education   HOME EXERCISE PROGRAM: From previous bout of therapy: Seated and standing PWR Up, Quadruped PWR moves on floor, [place]  Access Code: [email or web address] URL: [email or web address] Date: 10/27/2021 Prepared by: Mickie Bail Bowen Kia  Exercises - Standing on one leg  in corner   - 1 x daily - 7 x weekly - 1 sets - 3 reps - 30-45 second hold - Sit to stand with band pull-apart   - 1 x daily - 7 x weekly - 3 sets - 10 reps    GOALS: Goals reviewed with patient? Yes  SHORT TERM GOALS: Target date: 11/18/2021  MiniBEST to finish being assessed with LTG written. Baseline: 20/28 Goal status: MET  2.  Pt will perform at least 8 of 10 reps, minimal to no UE support, with NO retropulsion or posterior lean for imrpoved trasnfer safety and efficiency.  Baseline:  Goal status: MET   LONG TERM GOALS: Target date: 12/16/2021  Pt will be independent with final HEP for improved strength, balance, transfers, and gait.   Baseline:  Goal status: IN PROGRESS  2.  Patient will verbalize fall prevention strategies in the home, including tips to help with festination episodes in order to decrease risk of future falls.  Baseline:  Goal status: MET  3. Pt will improve MiniBest to 24/28 for decreased fall risk and improvement with compensatory stepping strategies.   Baseline: 20/28 Goal status: REVISED  4.  Pt will demonstrate appropriate use of LRAD for indoor and household activities, to assist with gait safety, at least 200 ft with supervision.  Baseline:  Goal status: INITIAL  5.  Pt will perform at least 10 reps with minimal to no UE support from lower/soft surfaces with supervision and no instances of retropulsion in order to demo improvement getting up from the couch. Baseline:  Goal status: MET   NEW LONG TERM  GOALS FOR UPDATED POC:  Target date: 01/12/2022  Pt will be independent with final HEP for improved strength, balance, transfers, and gait.   Baseline:  Goal status: IN PROGRESS  2.  6MWT to be performed and goal updated  Baseline:  Goal status: INITIAL   ASSESSMENT:  CLINICAL IMPRESSION: Pt with significant facial dyskinesias during session today due to timing with his medications and it is worse later in the day. Emphasis of skilled PT session on beginning LTG assessment. Pt has met 2/5 LTGs, improving his ability to perform sit <>stands without retropulsion and ability to verbalized fall prevention techniques in the home. Will finish goal assessment tomorrow, as pt was experiencing dyskinesias today, limiting his ability to see and communicate. Pt in agreement to continue PT for 4 more weeks at 2x/week for continued practice w/U-step, postural control and safety at home. Continue POC.    PERSONAL FACTORS Behavior pattern, Past/current experiences, Time since onset of injury/illness/exacerbation, and 3+ comorbidities: PD 2008, bilateral DBS (2018); hyperlipidemia, dystonia/dyskinesias  are also affecting patient's functional outcome.    PLAN: PT FREQUENCY: 2x/week  PT DURATION: 12 weeks + 4 weeks (recert)  PLANNED INTERVENTIONS: Therapeutic exercises, Therapeutic activity, Neuromuscular re-education, Balance training, Gait training, Patient/Family education, Self Care, Stair training, and DME instructions  PLAN FOR NEXT SESSION:   Continue gait training w/U-step (obstacles, turns, inclines/declines). Pt still needs to fill out form to obtain U-step. Work on sit <> stands with incr forward lean. Stepping/foot clearance tasks. SLS for balance. Unlevel surfaces. Work on posture and midline orientation. Wider BOS.   Cruzita Lederer Brailyn Delman, PT, DPT 12/15/2021, 3:00 PM

## 2021-12-15 NOTE — Therapy (Signed)
OUTPATIENT SPEECH LANGUAGE PATHOLOGY TREATMENT NOTE   Patient Name: Ronald Noble MRN: [id number] DOB:08-31-1945, 76 y.o., male Today's Date: 12/15/2021  PCP: Ronald Infante, MD REFERRING PROVIDER: Star Age, MD  END OF SESSION:   End of Session - 12/15/21 1313     Visit Number 10    Number of Visits 17    Date for SLP Re-Evaluation 12/19/21    Authorization Type Medicare/BCBS    SLP Start Time 1315    SLP Stop Time  1400    SLP Time Calculation (min) 45 min    Activity Tolerance Patient tolerated treatment well                   Past Medical History:  Diagnosis Date   BPH (benign prostatic hyperplasia)    COVID 04/24/2020   scratchy throat x 1 day all symptoms resolved   Parkinson disease ([place])    Skin cancer of face    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   Sleep apnea    mild no cpap used   Urethral stricture    Past Surgical History:  Procedure Laterality Date   BURR HOLE W/ STEREOTACTIC INSERTION OF DBS LEADS / INTRAOP MICROELECTRODE RECORDING  04/2014   [facility]   COLONOSCOPY  2019   CYSTOSCOPY WITH URETHRAL DILATATION N/A 06/15/2019   Procedure: CYSTOSCOPY WITH URETHRAL DILATATION;  Surgeon: Ronald Gallo, MD;  Location[facility];  Service: Urology;  Laterality: N/A;  [place] N/A 05/09/2020   Procedure: CYSTOSCOPY WITH BALLOON DILATATION OF POSTERIOR URETHRAL STRICTURE, FLUOROSCOPIC INTERPRETATION;  Surgeon: Ronald Gallo, MD;  Location: [facility];  Service: Urology;  Laterality: N/A;   KNEE SURGERY  yrs ago   exploratory/left   SKIN CANCER EXCISION  01/2014   face   TRANSURETHRAL RESECTION OF PROSTATE     2008   Patient Active Problem List   Diagnosis Date Noted   Rib pain on right side 12/10/2020   Hyperlipidemia 10/11/2020   Bilateral olecranon bursitis 07/05/2019   Other bursitis of knee, left knee 12/06/2018    Pain in right leg 12/06/2018   Parkinson disease ([place])     ONSET DATE: PD dx 2008; 10/02/2021 (referral)  REFERRING DIAG: (ICD-10-CM) - Dysarthria and anarthria R26.81  THERAPY DIAG:  Dysarthria and anarthria  Cognitive communication deficit  Rationale for Evaluation and Treatment Rehabilitation  SUBJECTIVE: "I did lessons 17, 18, and 19".   PAIN:  Are you having pain? No   OBJECTIVE:  TODAY'S TREATMENT:   12-15-21: Pt enters with average of 68-69dB in simple conversation. He has completed 3 lessons since last visit in [place]! arget improving vocal quality and increasing intensity through progressively difficulty speech tasks using Speak Out! program, lesson 20. Pt does not required modeling this date and only rare min-A for accurate execution of tasks. Averages this date: loud "ah" 90 dB; counting 80 dB; reading 76dB; cognitive speech task highly variable, averages 68 dB (ranges from 63 to 72dB). Pt with increasing awareness of decaying volume during cognitive task.   12-11-21: Pt with facial dyskinesias upon arrival to [place], reports interferes with speech but is still amenable to participation in today's session. Pt has completed 1 lesson since previous ST session. Tells SLP he must have done previously targeted lessons. Target improving vocal quality and increasing intensity through progressively difficulty speech tasks using Speak Out! program, lesson 16. Pt does not  required modeling this date and only rare min-A for accurate execution of tasks. Averages this date: loud "ah" 87 dB; counting 79 dB; reading 74 dB; cognitive speech task highly variable, averages 68 dB (ranges from 61-74 dB). Pt with increasing awareness of decaying volume during cognitive task. In simple conversation, volume decreases to 64-68dB, with mod visual and verbal cues for volume and intent. As he returns tomorrow morning for next session, homework is to speak with intent and volume with his wife on the way  home and over dinner.   PATIENT EDUCATION: Education details: see above Person educated: Patient Education method: Explanation, Demonstration, and Handouts Education comprehension: verbalized understanding, returned demonstration, and needs further education     HOME EXERCISE PROGRAM: Speak Out!  Speech Therapy Progress Note  Dates of Reporting Period: 10/20/2021 to 12/15/2021  Objective Reports of Subjective Statement: Pt is meeting volume targets on Speak Out! HEP with rare min A this past week. He is completing HEP with improved consistency  Objective Measurements: See dB measurements in treatment section above  Goal Update: Continue goals   Plan: Continue POC  Reason Skilled Services are Required: Ronald Noble has averaged WNL volume on automatic, oral reading and with min A on structured speech tasks. He continues to require frequent mod A to carryover volume and compensatory strategies to improve intelligibility at conversation level.      GOALS: Goals reviewed with patient? Yes   SHORT TERM GOALS: Target date: 11/17/2021     Pt will complete Speak Out! HEP at least 1x/day given occasional min A over 2 sessions  Baseline:12-15-21 Goal status: ongoing   2.  Pt will achieve targeted dB (85-90 dB) on warm up exercises with 80% accuracy given occasional min A over 2 sessions  Baseline:  11-07-21, 11-12-21 Goal status: Met   3.  Pt will achieve targeted dB (75-85 dB) on reading exercises with 80% accuracy given occasional min A over 2 sessions Baseline: 11-07-21, 11-12-21 Goal status: Met   4.  Pt will achieve targeted dB (72-78 dB) on cognitive exercises with 80% accuracy given occasional min A over 2 sessions Baseline: 11-07-21, 11-12-21 Goal status: Met   5.  Pt will utilize dysarthria compensations in 5-10 minute conversation to optimize vocal intensity and clarity given occasional min A over 2 sessions  Baseline: 11-07-21, 11-25-21 Goal status: Met   6.  Pt will utilize  internal/external memory and attention aids to optimize medication management and completion of daily tasks given occasional min A over 2 sessions  Baseline: 11-25-21 Goal status: Partially met   LONG TERM GOALS: Target date: 12/19/2021     Pt will complete Speak Out! HEP at least 1x/day (BID recommended) > 1 week  Baseline:  Goal status: ongoing   2.  Pt will achieve targeted dB levels in demonstration of Speak Out! Lessons with 90% accuracy given rare min A over 2 sessions  Baseline:12-15-21;  Goal status: ongoing   3.  Pt will utilize dysarthria compensations in 15+ minute conversation to optimize vocal intensity and clarity given rare min A over 2 sessions Baseline:  Goal status: ongoing   4.  Pt will utilize internal/external memory and attention aids to optimize medication management and completion of daily tasks given rare min A over 2 sessions  Baseline: 12/11/21 Goal status: ongoing   5.  Pt will report improved cognitive communication via PROM by 2 point improvements by last ST session  Baseline: CF=84; CPIB=5 Goal status: ongoing  ASSESSMENT:   CLINICAL IMPRESSION: "Ronald Noble" is a 76 y.o. male who was seen today for dysarthria and cognitive changes related to progressive Parkinson's Disease. Ongoing education and instruction of Speak Out! Program and principles to maximize vocal intensity and clarity secondary to PD related speech changes.Overall improved volume in HEP and structured tasks, however volume in spontaneous conversation remains sub WNL affecting his intelligibility.  Pt benefited from repetition of information to aid recall and accurate completion. Given change in cognition and decline in speech intelligibility, skilled ST intervention is recommended to optimize communication effectiveness as well as increase functional independence and safety through use of cognitive compensatory training.    OBJECTIVE IMPAIRMENTS  Objective impairments include attention,  memory, executive functioning, and dysarthria. These impairments are limiting patient from managing medications, household responsibilities, and effectively communicating at home and in community.Factors affecting potential to achieve goals and functional outcome are ability to learn/carryover information, cooperation/participation level, and medical prognosis. Patient will benefit from skilled SLP services to address above impairments and improve overall function.   REHAB POTENTIAL: Fair - hx of reduced HEP compliance    PLAN: SLP FREQUENCY: 2x/week   SLP DURATION: 8 weeks   PLANNED INTERVENTIONS: Cueing hierachy, Cognitive reorganization, Internal/external aids, Functional tasks, Multimodal communication approach, SLP instruction and feedback, Compensatory strategies, and Patient/family education   Iline Oven, CCC-SLP 12/15/2021, 2:03 PM

## 2021-12-16 ENCOUNTER — Encounter: Payer: Self-pay | Admitting: Occupational Therapy

## 2021-12-16 ENCOUNTER — Ambulatory Visit: Payer: Medicare Other | Admitting: Physical Therapy

## 2021-12-16 ENCOUNTER — Ambulatory Visit: Payer: Medicare Other | Admitting: Speech Pathology

## 2021-12-16 ENCOUNTER — Ambulatory Visit: Payer: Medicare Other | Admitting: Occupational Therapy

## 2021-12-16 DIAGNOSIS — R29818 Other symptoms and signs involving the nervous system: Secondary | ICD-10-CM | POA: Diagnosis not present

## 2021-12-16 DIAGNOSIS — R41840 Attention and concentration deficit: Secondary | ICD-10-CM

## 2021-12-16 DIAGNOSIS — M25611 Stiffness of right shoulder, not elsewhere classified: Secondary | ICD-10-CM

## 2021-12-16 DIAGNOSIS — R29898 Other symptoms and signs involving the musculoskeletal system: Secondary | ICD-10-CM

## 2021-12-16 DIAGNOSIS — R293 Abnormal posture: Secondary | ICD-10-CM

## 2021-12-16 DIAGNOSIS — R278 Other lack of coordination: Secondary | ICD-10-CM

## 2021-12-16 DIAGNOSIS — R41844 Frontal lobe and executive function deficit: Secondary | ICD-10-CM

## 2021-12-16 DIAGNOSIS — R41841 Cognitive communication deficit: Secondary | ICD-10-CM

## 2021-12-16 DIAGNOSIS — R471 Dysarthria and anarthria: Secondary | ICD-10-CM

## 2021-12-16 DIAGNOSIS — R2681 Unsteadiness on feet: Secondary | ICD-10-CM

## 2021-12-16 NOTE — Therapy (Signed)
OUTPATIENT OCCUPATIONAL THERAPY NEURO Treatment  Patient Name: Ronald Noble MRN: [id number] DOB:07/29/1945, 76 y.o., male Today's Date: 12/16/2021  PCP: Dr. [name] REFERRING PROVIDER: Dr. Star Age   OT End of Session - 12/16/21 0807     Visit Number 12    Number of Visits 17    Date for OT Re-Evaluation 01/18/22    Authorization Type Medicare and BCBS--covered 100%    Authorization Time Period cert period 2/0/94-10/12/60    Authorization - Visit Number 12    Authorization - Number of Visits 20    Progress Note Due on Visit 20    OT Start Time 0805    OT Stop Time 0845    OT Time Calculation (min) 40 min    Activity Tolerance Patient tolerated treatment well    Behavior During Therapy [facility] for tasks assessed/performed;Impulsive                    Past Medical History:  Diagnosis Date   BPH (benign prostatic hyperplasia)    COVID 04/24/2020   scratchy throat x 1 day all symptoms resolved   Parkinson disease ([place])    Skin cancer of face    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   Sleep apnea    mild no cpap used   Urethral stricture    Past Surgical History:  Procedure Laterality Date   BURR HOLE W/ STEREOTACTIC INSERTION OF DBS LEADS / INTRAOP MICROELECTRODE RECORDING  04/2014   [facility] in [facility]   COLONOSCOPY  2019   [place] N/A 06/15/2019   Procedure: CYSTOSCOPY WITH URETHRAL DILATATION;  Surgeon: Franchot Gallo, MD;  Location: [facility];  Service: Urology;  Laterality: N/A;  [place] N/A 05/09/2020   Procedure: CYSTOSCOPY WITH BALLOON DILATATION OF POSTERIOR URETHRAL STRICTURE, FLUOROSCOPIC INTERPRETATION;  Surgeon: Franchot Gallo, MD;  Location: [facility];  Service: Urology;  Laterality: N/A;   KNEE SURGERY  yrs ago   exploratory/left   SKIN CANCER EXCISION  01/2014   face   TRANSURETHRAL RESECTION OF  PROSTATE     2008   Patient Active Problem List   Diagnosis Date Noted   Rib pain on right side 12/10/2020   Hyperlipidemia 10/11/2020   Bilateral olecranon bursitis 07/05/2019   Other bursitis of knee, left knee 12/06/2018   Pain in right leg 12/06/2018   Parkinson disease ([place])     ONSET DATE: 10/02/21  (referral date, recommended from previous therapy d/c)  REFERRING DIAG: Parkinson's Disease   THERAPY DIAG:  Abnormal posture  Other lack of coordination  Other symptoms and signs involving the nervous system  Other symptoms and signs involving the musculoskeletal system  Stiffness of right shoulder, not elsewhere classified  Attention and concentration deficit  Unsteadiness on feet  Frontal lobe and executive function deficit  Rationale for Evaluation and Treatment Rehabilitation  SUBJECTIVE:   SUBJECTIVE STATEMENT: Doing ok   Goes by "Ronald Noble"  Pt accompanied by: self  PERTINENT HISTORY: Parkinson's Disease s/p bilateral DBS  PMH:  hx of skin CA, sleep apnea, bilateral DBS   PRECAUTIONS: Fall and Other: Bilateral DBS  WEIGHT BEARING RESTRICTIONS No  PAIN:  Are you having pain? No and Yes: NPRS scale: 2-3/10 Pain location: L shoulder Pain description: sharp at times Aggravating factors: raising arm Relieving factors: rest  FALLS: Has patient fallen in last 6 months? Yes. Number of falls 2 with  ED visits and stumbles/multiple other falls  (1-2x/month)  LIVING ENVIRONMENT: Lives with: lives with their spouse  PLOF: Independent with basic ADLs, Vocation/Vocational requirements: retired in March 2023, and Leisure: [employer] 2x/wk, stretching/yoga (and therapy exercises), walk (range is limited approx 2 blocks--uses hiking poles)  PATIENT GOALS improve coordination, ADLs  OBJECTIVE:   EVAL OBSERVATIONS: Pt reports facial dystonias associated with medications which make it difficult to communicate and difficult to keep eyes open.  Pt with hx of  dystonia/dyskinesias depending on medication cycle.  Bradykinesia noted, impulsivity noted at times.   TODAY'S TREATMENT:   Kinesiotape applied to L shoulder (correction piece for ER/positioning) and R shoulder to upper traps to relax.  Reviewed wear/care and instructed pt to remove Sunday.    Sitting, Closed-chain shoulder flex with ball in front of mirror for visual cueing for posture/shoulder positioning.  Also given min-mod verbal/tactile cueing for midline alignment (tends to shift to R) and L shoulder depression.   Sitting, functional reaching laterally and across body incorporating wt. Shift and trunk rotation with each UE with min cueing/set-up for large amplitude movements (performed to each side).  Practiced/reviewed large amplitude movement strategies for donning/doffing jacket and pt returned demo x3 with min cueing .  (Feet apart, use of trunk rotation, hands in sleeves)  Sitting, checked ROM and box and blocks test with min cueing to check posture/alignment in mirror prior to start and discussed progress--see goals section below.     PATIENT EDUCATION: Education details: donning/doffing jacket with large amplitude movement strategies Person educated: Patient Education method: Explanation, Demonstration, and Verbal cues Education comprehension: verbalized understanding, returned demonstration, and verbal cues required       HOME EXERCISE PROGRAM: Supine closed chain shoulder flexion and chest press with paper towel roll, min-mod v.c 11/18/21:  PWR! Moves (basic 4) in modified quadruped with emphasis and cueing on posture and shoulder positioning  11/25/21:  awareness of postural alignment and affect of functional activities as well recommendations for use of mirror as feedback  12/04/21:  Closed-chain shoulder flex with ball in front of mirror with focus on posture/alignment; Keeping Thinking Skills [place] & Memory Compensation Strategies  12/16/21:  donning/doffing jacket  with large amplitude movement strategies  GOALS: Potential Goals reviewed with patient? Yes  SHORT TERM GOALS: Target date: 11/20/21  Pt will be independent be updated PD-specific HEP. Goal status: IN PROGRESS.  Met with modified quadruped PWR!  2.  Pt will demo at least 135* R shoulder flex for functional reaching  Baseline:  125* Goal status: MET  11/27/21  130*   12/16/21:  140* (elbow ext WNL).  L-135* with elbow ext WNL  3.  Pt will write at least 4 sentences with good size/legibility. Goal status: MET  11/27/21:  met--no decr in size and improved legibility  4.  Pt will improve L hand coordination for ADLs as shown by improving time on 9-hole peg test by at least 5sec. Baseline:  47.09sec Goal status: IN PROGRESS/Not consistent  11/27/21:  52.41, 42sec.    5.  Pt will verbalize understanding of memory/cognitive compensation strategies. Goal status: MET  11/27/21--not yet issued.  12/04/21:  met   LONG TERM GOALS: Target date: 01/19/22  Pt will be independent with strategies/AE for ADLs/IADLs.(Including strategy for donning/ doffing jacket) Goal status: IN PROGRESS  12/16/21:  reviewed strategy for donning/doffing jacket  2.  Pt will improve L hand coordination for ADLs as shown by improving time on 9-hole peg test by at least 8sec. Baseline:  47.09sec Goal status: INITIAL  3.  Pt will improve bilateral functional reaching/coordination as shown by improving score on box and blocks test by at 4 blocks with LUE. Baseline: L-44 blocks Goal status: MET  12/16/21:  55 blocks  4.  Pt will improve bilateral functional reaching/coordination as shown by improving score on box and blocks test by at 4 blocks with RUE. Baseline:  R-47 blocks Goal status: MET  12/16/21:  55 blocks  5.  Pt will increase bilateral standing functional reach to 10 inches or greater to minimize fall risk. Baseline: R 9.5 L 8.5 Goal status: INITIAL  6.  Pt will demonstrate improved ease with fastening buttons as  evidenced by decreasing 3 button/ unbutton time to :49 secs or less. Baseline: 53.46 Goal status: INITIAL     ASSESSMENT:  CLINICAL IMPRESSION: Pt is progressing slowly towards goals.  He demonstrates improved UE ROM, posture, and functional reach.  Pt able to return demo large amplitude movement strategies for donning/doffing jacket.  PERFORMANCE DEFICITS in functional skills including ADLs, IADLs, coordination, dexterity, tone, ROM, flexibility, FMC, GMC, mobility, balance, body mechanics, endurance, decreased knowledge of precautions, decreased knowledge of use of DME, and UE functional use, cognitive skills including attention, memory, perception, and safety awareness, and psychosocial skills including environmental adaptation and habits.   IMPAIRMENTS are limiting patient from ADLs, IADLs, and leisure.   COMORBIDITIES may have co-morbidities  that affects occupational performance. Patient will benefit from skilled OT to address above impairments and improve overall function.  MODIFICATION OR ASSISTANCE TO COMPLETE EVALUATION: Min-Moderate modification of tasks or assist with assess necessary to complete an evaluation.  OT OCCUPATIONAL PROFILE AND HISTORY: Detailed assessment: Review of records and additional review of physical, cognitive, psychosocial history related to current functional performance.  CLINICAL DECISION MAKING: Moderate - several treatment options, min-mod task modification necessary  REHAB POTENTIAL: Good  EVALUATION COMPLEXITY: Moderate   PLAN: OT FREQUENCY: 2x/week  OT DURATION: 8 weeks +eval or 17 visits over 12 weeks for scheduling  PLANNED INTERVENTIONS: self care/ADL training, therapeutic exercise, therapeutic activity, neuromuscular re-education, manual therapy, passive range of motion, balance training, functional mobility training, aquatic therapy, splinting, ultrasound, paraffin, fluidotherapy, moist heat, cryotherapy, patient/family education,  cognitive remediation/compensation, energy conservation, and DME and/or AE instructions  RECOMMENDED OTHER SERVICES: current with PT and ST (evals today)  CONSULTED AND AGREED WITH PLAN OF CARE: Patient  PLAN FOR NEXT SESSION:  ? re-apply kinesiotape, coordination with focus on posture,  ADL strategies (buttoning)   Augustin Bun, OTR/L 12/16/2021, 8:07 AM [phone] Phone: [phone] 8:07 AM 12/16/21

## 2021-12-16 NOTE — Patient Instructions (Signed)
Keeping track of dyskinesia:  Text message yourself the date and time the dyskinesias start  We started a text message thread with yourself so all you have to do is: Open text messages Find your name in the list of messages Text the time Then your wife can see the date sent and time for recording in your log   Great job using your intentional speech on the phone!  When your college rang, he let you know that they are planning a lunch. We practiced adding the appointment to your calendar.  Making appointments in your calendar is a great way to make sure you know what is going on, don't over schedule yourself or forget an important event.   It is helpful to go ahead and put the appointments in your phone when you make them. Otherwise you may forget.   If you can't put it in your calendar right away: Jot a quick note down to aid in recall of details when you can put it in calendar Ask person you're scheduling with to send details via text message

## 2021-12-16 NOTE — Therapy (Signed)
OUTPATIENT PHYSICAL THERAPY NEURO TREATMENT   Patient Name: Ronald Noble MRN: [id number] DOB:Aug 08, 1945, 76 y.o., male Today's Date: 12/16/2021   PCP: [name], MD REFERRING PROVIDER: Star Age, MD      PT End of Session - 12/16/21 0853     Visit Number 14    Number of Visits 25   Recert   Date for PT Re-Evaluation 84/66/59   Recert   Authorization Type Medicare & BCBS    PT Start Time [phone]   Previous pt session ran late   PT Stop Time 0932   Pt in restroom for 8 minutes during session   PT Time Calculation (min) 41 min    Equipment Utilized During Treatment Gait belt    Activity Tolerance Patient tolerated treatment well    Behavior During Therapy [facility] for tasks assessed/performed;Impulsive                     Past Medical History:  Diagnosis Date   BPH (benign prostatic hyperplasia)    COVID 04/24/2020   scratchy throat x 1 day all symptoms resolved   Parkinson disease ([place])    Skin cancer of face    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   Sleep apnea    mild no cpap used   Urethral stricture    Past Surgical History:  Procedure Laterality Date   BURR HOLE W/ STEREOTACTIC INSERTION OF DBS LEADS / INTRAOP MICROELECTRODE RECORDING  04/2014   [facility] in [facility]   COLONOSCOPY  2019   [place] N/A 06/15/2019   Procedure: CYSTOSCOPY WITH URETHRAL DILATATION;  Surgeon: Franchot Gallo, MD;  Location: [facility];  Service: Urology;  Laterality: N/A;  [place] N/A 05/09/2020   Procedure: CYSTOSCOPY WITH BALLOON DILATATION OF POSTERIOR URETHRAL STRICTURE, FLUOROSCOPIC INTERPRETATION;  Surgeon: Franchot Gallo, MD;  Location: [facility];  Service: Urology;  Laterality: N/A;   KNEE SURGERY  yrs ago   exploratory/left   SKIN CANCER EXCISION  01/2014   face   TRANSURETHRAL RESECTION OF PROSTATE     2008   Patient Active  Problem List   Diagnosis Date Noted   Rib pain on right side 12/10/2020   Hyperlipidemia 10/11/2020   Bilateral olecranon bursitis 07/05/2019   Other bursitis of knee, left knee 12/06/2018   Pain in right leg 12/06/2018   Parkinson disease ([place])     ONSET DATE: 10/02/2021 (date of referral)  REFERRING DIAG: G20 (ICD-10-CM) - Parkinson's disease ([place])  THERAPY DIAG:  Unsteadiness on feet  Other lack of coordination  Abnormal posture  Rationale for Evaluation and Treatment Rehabilitation  SUBJECTIVE:  SUBJECTIVE STATEMENT: No new changes since yesterday. Pt not using cane during session and asked to use restroom at beginning of session   Pt accompanied by: self  PERTINENT HISTORY: PD 2008, bilateral DBS (2018); hyperlipidemia   Pt with facial dystonias/dyskinesias with his medications that make it difficult to communicate and keep his eyes open (happens approx. 45 minutes after taking meds)  PAIN:  Are you having pain? Yes: NPRS scale: 4/10 Pain location: L shoulder Pain description: Achy *reports soreness up posterior neck/occiput where DBS line is   PRECAUTIONS: Fall Bilateral DBS, No driving, freezing/festination episodes  FALLS: Has patient fallen in last 6 months? Yes. Number of falls Yes. Number of falls 2 with ED visits and stumbles/multiple other falls  (1-2x/month)   PLOF: Independent with basic ADLs, Independent with household mobility without device, and Independent with community mobility with device Independent with basic ADLs, Vocation/Vocational requirements: retired in March 2023, and Leisure: [employer] 2x/wk, stretching/yoga (and therapy exercises), walk (range is limited approx 2 blocks--uses hiking poles)  PATIENT GOALS Wants to work on slowing down.    OBJECTIVE:  TODAY'S TREATMENT:  Ther Act  LTG Assessment   [facility] PT Assessment - 12/16/21 0854       Mini-BESTest   Sit To Stand Normal: Comes to stand without use of hands and stabilizes independently.    Rise to Toes Moderate: Heels up, but not full range (smaller than when holding hands), OR noticeable instability for 3 s.    Stand on one leg (left) Moderate: < 20 s   <1s   Stand on one leg (right) Moderate: < 20 s   <2s   Stand on one leg - lowest score 1    Compensatory Stepping Correction - Forward Normal: Recovers independently with a single, large step (second realignement is allowed).    Compensatory Stepping Correction - Backward Moderate: More than one step is required to recover equilibrium   3 small steps   Compensatory Stepping Correction - Left Lateral Normal: Recovers independently with 1 step (crossover or lateral OK)    Compensatory Stepping Correction - Right Lateral Moderate: Several steps to recover equilibrium   2 small steps   Stepping Corredtion Lateral - lowest score 1    Stance - Feet together, eyes open, firm surface  Normal: 30s    Stance - Feet together, eyes closed, foam surface  Normal: 30s   Anterolateral R lean   Incline - Eyes Closed Normal: Stands independently 30s and aligns with gravity   Lateral lean to R   Change in Gait Speed Normal: Significantly changes walkling speed without imbalance    Walk with head turns - Horizontal Normal: performs head turns with no change in gait speed and good balance    Walk with pivot turns Normal: Turns with feet close FAST (< 3 steps) with good balance.    Step over obstacles Normal: Able to step over box with minimal change of gait speed and with good balance.    Timed UP & GO with Dual Task Moderate: Dual Task affects either counting OR walking (>10%) when compared to the TUG without Dual Task.    Mini-BEST total score 23      Timed Up and Go Test   Normal TUG (seconds) 6.81    Cognitive TUG (seconds) 8.57    Retro by 3s starting at 74           6MWT Gait pattern: step through pattern, decreased step length- Right, decreased hip/knee flexion-  Right, decreased ankle dorsiflexion- Right, lateral lean- Right, trunk flexed, narrow BOS, and poor foot clearance- Right Distance walked: 1260' (115' loop completed 10 times +110')  Assistive device utilized:  U-step walker  Level of assistance: SBA Comments: Noted increased cadence w/each lap and increased forward lean as pt fatigued. Pt has reduced lateral lean to R w/use of AD compared to no AD   PATIENT EDUCATION: Education details: MiniBest results, updated POC and appointments  Person educated: Patient Education method: Explanation Education comprehension: verbalized understanding and needs further education   HOME EXERCISE PROGRAM: From previous bout of therapy: Seated and standing PWR Up, Quadruped PWR moves on floor, [place]  Access Code: [email or web address] URL: [email or web address] Date: 10/27/2021 Prepared by: Mickie Bail Slayter Moorhouse  Exercises - Standing on one leg in corner   - 1 x daily - 7 x weekly - 1 sets - 3 reps - 30-45 second hold - Sit to stand with band pull-apart   - 1 x daily - 7 x weekly - 3 sets - 10 reps    GOALS: Goals reviewed with patient? Yes  SHORT TERM GOALS: Target date: 11/18/2021  MiniBEST to finish being assessed with LTG written. Baseline: 20/28 Goal status: MET  2.  Pt will perform at least 8 of 10 reps, minimal to no UE support, with NO retropulsion or posterior lean for imrpoved trasnfer safety and efficiency.  Baseline:  Goal status: MET   LONG TERM GOALS: Target date: 12/16/2021  Pt will be independent with final HEP for improved strength, balance, transfers, and gait.   Baseline:  Goal status: IN PROGRESS  2.  Patient will verbalize fall prevention strategies in the home, including tips to help with festination episodes in order to decrease risk of future falls.  Baseline:  Goal status:  MET  3. Pt will improve MiniBest to 24/28 for decreased fall risk and improvement with compensatory stepping strategies.   Baseline: 20/28; 23/28 Goal status: NOT MET  4.  Pt will demonstrate appropriate use of LRAD for indoor and household activities, to assist with gait safety, at least 200 ft with supervision.  Baseline:  Goal status: MET  5.  Pt will perform at least 10 reps with minimal to no UE support from lower/soft surfaces with supervision and no instances of retropulsion in order to demo improvement getting up from the couch. Baseline:  Goal status: MET   NEW LONG TERM GOALS FOR UPDATED POC:  Target date: 01/12/2022  Pt will be independent with final HEP for improved strength, balance, transfers, and gait.   Baseline:  Goal status: IN PROGRESS  2.  Pt will ambulate greater than or equal to 1500' feet on 6MWT with U-step walker mod I for improved cardiovascular endurance and BLE strength.   Baseline: 1260' w/U-step and S* Goal status: INITIAL   ASSESSMENT:  CLINICAL IMPRESSION: Emphasis of skilled PT session on completing LTG assessment. Session limited due to pt using restroom for 8 minutes during session and starting session late. Pt has met 3/5 LTGS. Pt scored a 23/28 on MiniBest, improved from his baseline of 20/28 but narrowly missing his goal level of 24/28. Pt showed greatest improvement w/turns, single leg stance and dynamic gait. Pt ambulated 1260' on 6MWT w/use of U-step walker and S*. Noted improved postural control w/use of AD but tendency for forward lean and shuffling gait w/fatigue. Continue POC.    PERSONAL FACTORS Behavior pattern, Past/current experiences, Time since onset of injury/illness/exacerbation, and 3+ comorbidities: PD 2008, bilateral DBS (2018); hyperlipidemia, dystonia/dyskinesias  are also affecting patient's functional outcome.    PLAN: PT FREQUENCY: 2x/week  PT DURATION: 12 weeks + 4 weeks (recert)  PLANNED INTERVENTIONS: Therapeutic  exercises, Therapeutic activity, Neuromuscular re-education, Balance training, Gait training, Patient/Family education, Self Care, Stair training, and DME instructions  PLAN FOR NEXT SESSION:   Continue gait training w/U-step (obstacles, turns, inclines/declines). Pt still needs to fill out form to obtain U-step. Work on sit <> stands with incr forward lean. Stepping/foot clearance tasks. SLS for balance. Unlevel surfaces. Work on posture and midline orientation. Wider BOS. Balance on unlevel surfaces   Sherylann Vangorden E Sameera Betton, PT, DPT 12/16/2021, 9:38 AM

## 2021-12-16 NOTE — Therapy (Signed)
OUTPATIENT SPEECH LANGUAGE PATHOLOGY TREATMENT NOTE   Patient Name: Ronald Noble MRN: [id number] DOB:18-Jul-1945, 76 y.o., male Today's Date: 12/16/2021  PCP: [name], MD REFERRING PROVIDER: Star Age, MD  END OF SESSION:   End of Session - 12/16/21 0836     Visit Number 11    Number of Visits 17    Date for SLP Re-Evaluation 12/19/21    Authorization Type Medicare/BCBS    SLP Start Time 0930    SLP Stop Time  1245    SLP Time Calculation (min) 45 min    Activity Tolerance Patient tolerated treatment well                    Past Medical History:  Diagnosis Date   BPH (benign prostatic hyperplasia)    COVID 04/24/2020   scratchy throat x 1 day all symptoms resolved   Parkinson disease ([place])    Skin cancer of face    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   Sleep apnea    mild no cpap used   Urethral stricture    Past Surgical History:  Procedure Laterality Date   BURR HOLE W/ STEREOTACTIC INSERTION OF DBS LEADS / INTRAOP MICROELECTRODE RECORDING  04/2014   [facility]   COLONOSCOPY  2019   CYSTOSCOPY WITH URETHRAL DILATATION N/A 06/15/2019   Procedure: CYSTOSCOPY WITH URETHRAL DILATATION;  Surgeon: Franchot Gallo, MD;  Location: [facility];  Service: Urology;  Laterality: N/A;  [place] N/A 05/09/2020   Procedure: CYSTOSCOPY WITH BALLOON DILATATION OF POSTERIOR URETHRAL STRICTURE, FLUOROSCOPIC INTERPRETATION;  Surgeon: Franchot Gallo, MD;  Location: [facility];  Service: Urology;  Laterality: N/A;   KNEE SURGERY  yrs ago   exploratory/left   SKIN CANCER EXCISION  01/2014   face   TRANSURETHRAL RESECTION OF PROSTATE     2008   Patient Active Problem List   Diagnosis Date Noted   Rib pain on right side 12/10/2020   Hyperlipidemia 10/11/2020   Bilateral olecranon bursitis 07/05/2019   Other bursitis of knee, left knee 12/06/2018    Pain in right leg 12/06/2018   Parkinson disease ([place])     ONSET DATE: PD dx 2008; 10/02/2021 (referral)  REFERRING DIAG: (ICD-10-CM) - Dysarthria and anarthria R26.81  THERAPY DIAG:  Dysarthria and anarthria  Cognitive communication deficit  Rationale for Evaluation and Treatment Rehabilitation  SUBJECTIVE: "I've been using a log" re: medication management   PAIN:  Are you having pain? No   OBJECTIVE:  TODAY'S TREATMENT:   12-16-21: Generated med log with [name], documenting all his medications, the amount, and time of administration. Use of medications list in Epic to fully recall all medications. However, pt able to recall time and amount of medications with supervision A. Generated strategy to support pt's ability to successful use med log he has begin implementing. Tells ST he occasionally forgets to record time. Pt started text message thread with himself and use of systematic  instruction for finding and texting self time. Pt demonstrates skills with supervision-A x2 trials. Provided written handout to support practice during today's session. Target recall of appointments and optimizing schedule management through pt ID tool of phone calendar. Pt able to add novel appointment into phone using text to speak and rare min-A from SLP. SLP provided education on strategies to optimize tool use to include not delaying, writing note to aid in recall of details to  add if must delay, or asking for details via text to support transfer into phone calendar. SLP emphasized that pt must transfer into calendar as that is the tool he has ID to reference for schedule management. Update HEP to update calendar for upcoming therapy appointments.   12-15-21: Pt enters with average of 68-69dB in simple conversation. He has completed 3 lessons since last visit in Spe[employer] Out [employer]! arget improving vocal quality and increasing intensity through progressively difficulty speech tasks using Speak Out! program, lesson  20. Pt does not required modeling this date and only rare min-A for accurate execution of tasks. Averages this date: loud "ah" 90 dB; counting 80 dB; reading 76dB; cognitive speech task highly variable, averages 68 dB (ranges from 63 to 72dB). Pt with increasing awareness of decaying volume during cognitive task.   12-11-21: Pt with facial dyskinesias upon arrival to ST, reports interferes with speech but is still amenable to participation in today's session. Pt has completed 1 lesson since previous ST session. Tells SLP he must have done previously targeted lessons. Target improving vocal quality and increasing intensity through progressively difficulty speech tasks using Speak Out! program, lesson 16. Pt does not required modeling this date and only rare min-A for accurate execution of tasks. Averages this date: loud "ah" 87 dB; counting 79 dB; reading 74 dB; cognitive speech task highly variable, averages 68 dB (ranges from 61-74 dB). Pt with increasing awareness of decaying volume during cognitive task. In simple conversation, volume decreases to 64-68dB, with mod visual and verbal cues for volume and intent. As he returns tomorrow morning for next session, homework is to speak with intent and volume with his wife on the way home and over dinner.   PATIENT EDUCATION: Education details: see above Person educated: Patient Education method: Explanation, Demonstration, and Handouts Education comprehension: verbalized understanding, returned demonstration, and needs further education     HOME EXERCISE PROGRAM: Speak Out!     GOALS: Goals reviewed with patient? Yes   SHORT TERM GOALS: Target date: 11/17/2021     Pt will complete Speak Out! HEP at least 1x/day given occasional min A over 2 sessions  Baseline:12-15-21 Goal status: ongoing   2.  Pt will achieve targeted dB (85-90 dB) on warm up exercises with 80% accuracy given occasional min A over 2 sessions  Baseline:  11-07-21, 11-12-21 Goal status:  Met   3.  Pt will achieve targeted dB (75-85 dB) on reading exercises with 80% accuracy given occasional min A over 2 sessions Baseline: 11-07-21, 11-12-21 Goal status: Met   4.  Pt will achieve targeted dB (72-78 dB) on cognitive exercises with 80% accuracy given occasional min A over 2 sessions Baseline: 11-07-21, 11-12-21 Goal status: Met   5.  Pt will utilize dysarthria compensations in 5-10 minute conversation to optimize vocal intensity and clarity given occasional min A over 2 sessions  Baseline: 11-07-21, 11-25-21 Goal status: Met   6.  Pt will utilize internal/external memory and attention aids to optimize medication management and completion of daily tasks given occasional min A over 2 sessions  Baseline: 11-25-21 Goal status: Partially met   LONG TERM GOALS: Target date: 12/19/2021     Pt will complete Speak Out! HEP at least 1x/day (BID recommended) > 1 week  Baseline:  Goal status: ongoing   2.  Pt will achieve targeted dB levels in demonstration of Speak Out! Lessons with 90% accuracy given rare min A over 2 sessions  Baseline:12-15-21;  Goal status: ongoing     3.  Pt will utilize dysarthria compensations in 15+ minute conversation to optimize vocal intensity and clarity given rare min A over 2 sessions Baseline:  Goal status: ongoing   4.  Pt will utilize internal/external memory and attention aids to optimize medication management and completion of daily tasks given rare min A over 2 sessions  Baseline: 12/11/21 Goal status: ongoing   5.  Pt will report improved cognitive communication via PROM by 2 point improvements by last ST session  Baseline: CF=84; CPIB=5 Goal status: ongoing       ASSESSMENT:   CLINICAL IMPRESSION: "[name]" is a 75 y.o. male who was seen today for dysarthria and cognitive changes related to progressive Parkinson's Disease. Ongoing education and instruction of Speak Out! Program and principles to maximize vocal intensity and clarity secondary to PD  related speech changes.Overall improved volume in HEP and structured tasks, however volume in spontaneous conversation remains sub WNL affecting his intelligibility.  Pt benefited from repetition of information to aid recall and accurate completion. Given change in cognition and decline in speech intelligibility, skilled ST intervention is recommended to optimize communication effectiveness as well as increase functional independence and safety through use of cognitive compensatory training.    OBJECTIVE IMPAIRMENTS  Objective impairments include attention, memory, executive functioning, and dysarthria. These impairments are limiting patient from managing medications, household responsibilities, and effectively communicating at home and in community.Factors affecting potential to achieve goals and functional outcome are ability to learn/carryover information, cooperation/participation level, and medical prognosis. Patient will benefit from skilled SLP services to address above impairments and improve overall function.   REHAB POTENTIAL: Fair - hx of reduced HEP compliance    PLAN: SLP FREQUENCY: 2x/week   SLP DURATION: 8 weeks   PLANNED INTERVENTIONS: Cueing hierachy, Cognitive reorganization, Internal/external aids, Functional tasks, Multimodal communication approach, SLP instruction and feedback, Compensatory strategies, and Patient/family education   Jessica L Thomas, CCC-SLP 12/16/2021, 9:33 AM 

## 2021-12-22 NOTE — Therapy (Signed)
OUTPATIENT OCCUPATIONAL THERAPY NEURO Treatment  Patient Name: Ronald Noble MRN: [id number] DOB:1946/03/11, 76 y.o., male Today's Date: 12/23/2021  PCP: Dr. [name] REFERRING PROVIDER: Dr. Star Age   OT End of Session - 12/23/21 0809     Visit Number 13    Number of Visits 17    Date for OT Re-Evaluation 01/18/22    Authorization Type Medicare and BCBS--covered 100%    Authorization Time Period cert period 12/11/33-07/03/90    Authorization - Visit Number 13    Authorization - Number of Visits 20    Progress Note Due on Visit 20    OT Start Time 0807    OT Stop Time 0845    OT Time Calculation (min) 38 min    Activity Tolerance Patient tolerated treatment well    Behavior During Therapy [facility] for tasks assessed/performed;Impulsive                     Past Medical History:  Diagnosis Date   BPH (benign prostatic hyperplasia)    COVID 04/24/2020   scratchy throat x 1 day all symptoms resolved   Parkinson disease ([place])    Skin cancer of face    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   Sleep apnea    mild no cpap used   Urethral stricture    Past Surgical History:  Procedure Laterality Date   BURR HOLE W/ STEREOTACTIC INSERTION OF DBS LEADS / INTRAOP MICROELECTRODE RECORDING  04/2014   [facility]   COLONOSCOPY  2019   [place] N/A 06/15/2019   Procedure: CYSTOSCOPY WITH URETHRAL DILATATION;  Surgeon: Franchot Gallo, MD;  Location: [facility];  Service: Urology;  Laterality: N/A;  [place] N/A 05/09/2020   Procedure: CYSTOSCOPY WITH BALLOON DILATATION OF POSTERIOR URETHRAL STRICTURE, FLUOROSCOPIC INTERPRETATION;  Surgeon: Franchot Gallo, MD;  Location: [facility];  Service: Urology;  Laterality: N/A;   KNEE SURGERY  yrs ago   exploratory/left   SKIN CANCER EXCISION  01/2014   face   TRANSURETHRAL RESECTION OF  PROSTATE     2008   Patient Active Problem List   Diagnosis Date Noted   Rib pain on right side 12/10/2020   Hyperlipidemia 10/11/2020   Bilateral olecranon bursitis 07/05/2019   Other bursitis of knee, left knee 12/06/2018   Pain in right leg 12/06/2018   Parkinson disease ([place])     ONSET DATE: 10/02/21  (referral date, recommended from previous therapy d/c)  REFERRING DIAG: Parkinson's Disease   THERAPY DIAG:  Other lack of coordination  Other symptoms and signs involving the nervous system  Other symptoms and signs involving the musculoskeletal system  Stiffness of right shoulder, not elsewhere classified  Abnormal posture  Attention and concentration deficit  Unsteadiness on feet  Frontal lobe and executive function deficit  Other abnormalities of gait and mobility  Rationale for Evaluation and Treatment Rehabilitation  SUBJECTIVE:   SUBJECTIVE STATEMENT: Pt reports that he fell and scraped his knee dragging a limb off dirt road.     Goes by "Ronald Noble"  Pt accompanied by: self  PERTINENT HISTORY: Parkinson's Disease s/p bilateral DBS  PMH:  hx of skin CA, sleep apnea, bilateral DBS   PRECAUTIONS: Fall and Other: Bilateral DBS  WEIGHT BEARING RESTRICTIONS No  PAIN:  Are you having pain? No and Yes: NPRS scale: 2-3/10 Pain location: L shoulder Pain description: sharp at  times Aggravating factors: raising arm Relieving factors: rest  FALLS: Has patient fallen in last 6 months? Yes. Number of falls 2 with ED visits and stumbles/multiple other falls  (1-2x/month)  LIVING ENVIRONMENT: Lives with: lives with their spouse  PLOF: Independent with basic ADLs, Vocation/Vocational requirements: retired in March 2023, and Leisure: [employer] 2x/wk, stretching/yoga (and therapy exercises), walk (range is limited approx 2 blocks--uses hiking poles)  PATIENT GOALS improve coordination, ADLs  OBJECTIVE:   EVAL OBSERVATIONS: Pt reports facial dystonias  associated with medications which make it difficult to communicate and difficult to keep eyes open.  Pt with hx of dystonia/dyskinesias depending on medication cycle.  Bradykinesia noted, impulsivity noted at times.   TODAY'S TREATMENT:   Sitting, Closed-chain shoulder flex with ball in front of mirror for visual cueing for posture/shoulder positioning.  Also given min-mod verbal/tactile cueing for midline alignment (tends to shift to R) and L shoulder depression.   PWR! Up in sitting with mirror and mod cueing for mid-line alignment, shoulder depression and ER with large amplitude movement strategies.  Practicing fastening/unfastening buttons with min-mod cueing for large amplitude movements x2 with incr time (mirror for postural alignment cueing).   Then checked LTG--see below.  Standing (shoulder hike noted with sitting), to flip cards with BUEs simultaneously with focus on posture (mirror for feedback) and min cueing for posture/large amplitude movements/supination.  PWR! Hands to slide cards with each hand with mod cueing for large amplitude movements.    PATIENT EDUCATION: Education details: large amplitude movement strategies for fastening/unfastening buttons  Person educated: Patient Education method: Explanation, Demonstration, and Verbal cues Education comprehension: verbalized understanding, returned demonstration, and verbal cues required    HOME EXERCISE PROGRAM: Coordination HEP  Supine closed chain shoulder flexion and chest press with paper towel roll, min-mod v.c 11/18/21:  PWR! Moves (basic 4) in modified quadruped with emphasis and cueing on posture and shoulder positioning  11/25/21:  awareness of postural alignment and affect of functional activities as well recommendations for use of mirror as feedback  12/04/21:  Closed-chain shoulder flex with ball in front of mirror with focus on posture/alignment; Keeping Thinking [employer] [place] & Memory Compensation Strategies   12/16/21:  donning/doffing jacket with large amplitude movement strategies  GOALS: Potential Goals reviewed with patient? Yes  SHORT TERM GOALS: Target date: 11/20/21  Pt will be independent be updated PD-specific HEP. Goal status: MET.  12/23/21  2.  Pt will demo at least 135* R shoulder flex for functional reaching  Baseline:  125* Goal status: MET  11/27/21  130*   12/16/21:  140* (elbow ext WNL).  L-135* with elbow ext WNL  3.  Pt will write at least 4 sentences with good size/legibility. Goal status: MET  11/27/21:  met--no decr in size and improved legibility  4.  Pt will improve L hand coordination for ADLs as shown by improving time on 9-hole peg test by at least 5sec. Baseline:  47.09sec Goal status: IN PROGRESS/Not consistent  11/27/21:  52.41, 42sec.    5.  Pt will verbalize understanding of memory/cognitive compensation strategies. Goal status: MET  11/27/21--not yet issued.  12/04/21:  met   LONG TERM GOALS: Target date: 01/19/22  Pt will be independent with strategies/AE for ADLs/IADLs.(Including strategy for donning/ doffing jacket) Goal status: IN PROGRESS  reviewed strategy for donning/doffing jacket, buttons, sit>stand, functional reach  2.  Pt will improve L hand coordination for ADLs as shown by improving time on 9-hole peg test by at least 8sec. Baseline:  47.09sec Goal status: INITIAL  3.  Pt will improve bilateral functional reaching/coordination as shown by improving score on box and blocks test by at 4 blocks with LUE. Baseline: L-44 blocks Goal status: MET  12/16/21:  55 blocks  4.  Pt will improve bilateral functional reaching/coordination as shown by improving score on box and blocks test by at 4 blocks with RUE. Baseline:  R-47 blocks Goal status: MET  12/16/21:  55 blocks  5.  Pt will increase bilateral standing functional reach to 10 inches or greater to minimize fall risk. Baseline: R 9.5 L 8.5 Goal status: INITIAL  6.  Pt will demonstrate improved  ease with fastening buttons as evidenced by decreasing 3 button/ unbutton time to :49 secs or less. Baseline: 53.46 Goal status: MET  12/23/21:  43.40sec     ASSESSMENT:  CLINICAL IMPRESSION: Pt is progressing slowly towards goals.  Pt demo improved ability to fasten buttons with use of strategies (LTG met).  PERFORMANCE DEFICITS in functional skills including ADLs, IADLs, coordination, dexterity, tone, ROM, flexibility, FMC, GMC, mobility, balance, body mechanics, endurance, decreased knowledge of precautions, decreased knowledge of use of DME, and UE functional use, cognitive skills including attention, memory, perception, and safety awareness, and psychosocial skills including environmental adaptation and habits.   IMPAIRMENTS are limiting patient from ADLs, IADLs, and leisure.   COMORBIDITIES may have co-morbidities  that affects occupational performance. Patient will benefit from skilled OT to address above impairments and improve overall function.  MODIFICATION OR ASSISTANCE TO COMPLETE EVALUATION: Min-Moderate modification of tasks or assist with assess necessary to complete an evaluation.  OT OCCUPATIONAL PROFILE AND HISTORY: Detailed assessment: Review of records and additional review of physical, cognitive, psychosocial history related to current functional performance.  CLINICAL DECISION MAKING: Moderate - several treatment options, min-mod task modification necessary  REHAB POTENTIAL: Good  EVALUATION COMPLEXITY: Moderate   PLAN: OT FREQUENCY: 2x/week  OT DURATION: 8 weeks +eval or 17 visits over 12 weeks for scheduling  PLANNED INTERVENTIONS: self care/ADL training, therapeutic exercise, therapeutic activity, neuromuscular re-education, manual therapy, passive range of motion, balance training, functional mobility training, aquatic therapy, splinting, ultrasound, paraffin, fluidotherapy, moist heat, cryotherapy, patient/family education, cognitive  remediation/compensation, energy conservation, and DME and/or AE instructions  RECOMMENDED OTHER SERVICES: current with PT and ST (evals today)  CONSULTED AND AGREED WITH PLAN OF CARE: Patient  PLAN FOR NEXT SESSION:  (scheduled through Oct 2), coordination with focus on posture, ADL strategies, continue to address remaining goals   [facility], OTR/L 12/23/2021, 8:09 AM [phone] Phone: [phone] 8:09 AM 12/23/21

## 2021-12-23 ENCOUNTER — Ambulatory Visit: Payer: Medicare Other | Admitting: Occupational Therapy

## 2021-12-23 ENCOUNTER — Ambulatory Visit: Payer: Medicare Other | Admitting: Physical Therapy

## 2021-12-23 ENCOUNTER — Encounter: Payer: Self-pay | Admitting: Occupational Therapy

## 2021-12-23 ENCOUNTER — Ambulatory Visit: Payer: Medicare Other | Admitting: Speech Pathology

## 2021-12-23 DIAGNOSIS — R29818 Other symptoms and signs involving the nervous system: Secondary | ICD-10-CM | POA: Diagnosis not present

## 2021-12-23 DIAGNOSIS — R471 Dysarthria and anarthria: Secondary | ICD-10-CM

## 2021-12-23 DIAGNOSIS — R278 Other lack of coordination: Secondary | ICD-10-CM | POA: Diagnosis not present

## 2021-12-23 DIAGNOSIS — R29898 Other symptoms and signs involving the musculoskeletal system: Secondary | ICD-10-CM

## 2021-12-23 DIAGNOSIS — R41844 Frontal lobe and executive function deficit: Secondary | ICD-10-CM

## 2021-12-23 DIAGNOSIS — R41840 Attention and concentration deficit: Secondary | ICD-10-CM

## 2021-12-23 DIAGNOSIS — R41841 Cognitive communication deficit: Secondary | ICD-10-CM

## 2021-12-23 DIAGNOSIS — R2681 Unsteadiness on feet: Secondary | ICD-10-CM

## 2021-12-23 DIAGNOSIS — M25611 Stiffness of right shoulder, not elsewhere classified: Secondary | ICD-10-CM | POA: Diagnosis not present

## 2021-12-23 DIAGNOSIS — R293 Abnormal posture: Secondary | ICD-10-CM | POA: Diagnosis not present

## 2021-12-23 DIAGNOSIS — R2689 Other abnormalities of gait and mobility: Secondary | ICD-10-CM

## 2021-12-23 NOTE — Addendum Note (Signed)
Addended by: Myra Gianotti L on: 12/23/2021 10:26 AM   Modules accepted: Orders

## 2021-12-23 NOTE — Therapy (Addendum)
OUTPATIENT SPEECH LANGUAGE PATHOLOGY TREATMENT NOTE   Patient Name: Ronald Noble MRN: [id number] DOB:May 13, 1945, 76 y.o., male Today's Date: 12/23/2021  PCP: [name], MD REFERRING PROVIDER: Star Age, MD  END OF SESSION:   End of Session - 12/23/21 0835     Visit Number 12    Number of Visits 17    Date for SLP Re-Evaluation 12/23/21    Authorization Type Medicare/BCBS    SLP Start Time 0930    SLP Stop Time  0037    SLP Time Calculation (min) 45 min    Activity Tolerance Patient tolerated treatment well                    Past Medical History:  Diagnosis Date   BPH (benign prostatic hyperplasia)    COVID 04/24/2020   scratchy throat x 1 day all symptoms resolved   Parkinson disease ([place])    Skin cancer of face    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   Sleep apnea    mild no cpap used   Urethral stricture    Past Surgical History:  Procedure Laterality Date   BURR HOLE W/ STEREOTACTIC INSERTION OF DBS LEADS / INTRAOP MICROELECTRODE RECORDING  04/2014   [facility]   COLONOSCOPY  2019   CYSTOSCOPY WITH URETHRAL DILATATION N/A 06/15/2019   Procedure: CYSTOSCOPY WITH URETHRAL DILATATION;  Surgeon: Franchot Gallo, MD;  Location: [facility];  Service: Urology;  Laterality: N/A;  [place] N/A 05/09/2020   Procedure: CYSTOSCOPY WITH BALLOON DILATATION OF POSTERIOR URETHRAL STRICTURE, FLUOROSCOPIC INTERPRETATION;  Surgeon: Franchot Gallo, MD;  Location: [facility];  Service: Urology;  Laterality: N/A;   KNEE SURGERY  yrs ago   exploratory/left   SKIN CANCER EXCISION  01/2014   face   TRANSURETHRAL RESECTION OF PROSTATE     2008   Patient Active Problem List   Diagnosis Date Noted   Rib pain on right side 12/10/2020   Hyperlipidemia 10/11/2020   Bilateral olecranon bursitis 07/05/2019   Other bursitis of knee, left knee 12/06/2018    Pain in right leg 12/06/2018   Parkinson disease ([place])     ONSET DATE: PD dx 2008; 10/02/2021 (referral)  REFERRING DIAG: (ICD-10-CM) - Dysarthria and anarthria R26.81  THERAPY DIAG:  Cognitive communication deficit  Dysarthria and anarthria  Rationale for Evaluation and Treatment Rehabilitation  SUBJECTIVE: "I've been doing my home practice"   PAIN:  Are you having pain? No  SPEECH THERAPY DISCHARGE SUMMARY  Visits from Start of Care: 12  Current functional level related to goals / functional outcomes: Moderate improvement in overall speech clarity and ineligibility in structured exercises, with improved carryover observed in conversational speech.    Remaining deficits: Hypokinetic dysarthria, cognitive impairment    Education / Equipment: Speak Out!, patient specific external cognitive assistive device selection and implementation of devices to support functioning, HEP  Patient agrees to discharge. Patient goals were met. Patient is being discharged due to being pleased with the current functional level.    OBJECTIVE:  TODAY'S TREATMENT:   12-23-21: Established ongoing HEP program to guide pt's home practice as he is d/c this date from [place]. Education on need to maintain practice d/t nature of Parkinson's as cause for dysarthria. Pt verbalizes understanding. SLP led pt through [place], with rare min-A required for accurate execution of exercises, rare min-A for pacing of reading, and  occasional min-A for increasing breath support throughout. Average vocal intensity this date: sustained "ah" 86 dB; counting 84 dB; reading (multi-paragraph) 75 dB; cognitive exercise 71 dB. Pt able to teach back importance of intent for improving speech clarity demonstrating great insight into what improves his communication efficacy. SLP readministered Communication Participation Item Bank, pt with subjective reporting of improvement (15 vs baseline of 5):  "Quite a bit of  difficulty" = communicating quickly, communicating with people do not know, asking questions in conversation, having long conversation, getting turn in fast-moving conversation "A little diffculty" = talking to people you know, communicating in community, communicating in small group, giving detailed information, trying to persuade someone  Pt pleased with current status, agreeable to ST d/c.   12-16-21: Generated med log with Ronald Noble, documenting all his medications, the amount, and time of administration. Use of medications list in Epic to fully recall all medications. However, pt able to recall time and amount of medications with supervision A. Generated strategy to support pt's ability to successful use med log he has begin implementing. Tells ST he occasionally forgets to record time. Pt started text message thread with himself and use of systematic  instruction for finding and texting self time. Pt demonstrates skills with supervision-A x2 trials. Provided written handout to support practice during today's session. Target recall of appointments and optimizing schedule management through pt ID tool of phone calendar. Pt able to add novel appointment into phone using text to speak and rare min-A from SLP. SLP provided education on strategies to optimize tool use to include not delaying, writing note to aid in recall of details to add if must delay, or asking for details via text to support transfer into phone calendar. SLP emphasized that pt must transfer into calendar as that is the tool he has ID to reference for schedule management. Update HEP to update calendar for upcoming therapy appointments.   12-15-21: Pt enters with average of 68-69dB in simple conversation. He has completed 3 lessons since last visit in [place]! arget improving vocal quality and increasing intensity through progressively difficulty speech tasks using Speak Out! program, lesson 20. Pt does not required modeling this date and  only rare min-A for accurate execution of tasks. Averages this date: loud "ah" 90 dB; counting 80 dB; reading 76dB; cognitive speech task highly variable, averages 68 dB (ranges from 63 to 72dB). Pt with increasing awareness of decaying volume during cognitive task.   12-11-21: Pt with facial dyskinesias upon arrival to [place], reports interferes with speech but is still amenable to participation in today's session. Pt has completed 1 lesson since previous ST session. Tells SLP he must have done previously targeted lessons. Target improving vocal quality and increasing intensity through progressively difficulty speech tasks using Speak Out! program, lesson 16. Pt does not required modeling this date and only rare min-A for accurate execution of tasks. Averages this date: loud "ah" 87 dB; counting 79 dB; reading 74 dB; cognitive speech task highly variable, averages 68 dB (ranges from 61-74 dB). Pt with increasing awareness of decaying volume during cognitive task. In simple conversation, volume decreases to 64-68dB, with mod visual and verbal cues for volume and intent. As he returns tomorrow morning for next session, homework is to speak with intent and volume with his wife on the way home and over dinner.   PATIENT EDUCATION: Education details: see above Person educated: Patient Education method: Explanation, Demonstration, and Handouts Education comprehension: verbalized understanding, returned demonstration, and needs further  education     HOME EXERCISE PROGRAM: Speak Out!     GOALS: Goals reviewed with patient? Yes   SHORT TERM GOALS: Target date: 11/17/2021     Pt will complete Speak Out! HEP at least 1x/day given occasional min A over 2 sessions  Baseline:12-15-21 Goal status: ongoing   2.  Pt will achieve targeted dB (85-90 dB) on warm up exercises with 80% accuracy given occasional min A over 2 sessions  Baseline:  11-07-21, 11-12-21 Goal status: Met   3.  Pt will achieve targeted dB (75-85  dB) on reading exercises with 80% accuracy given occasional min A over 2 sessions Baseline: 11-07-21, 11-12-21 Goal status: Met   4.  Pt will achieve targeted dB (72-78 dB) on cognitive exercises with 80% accuracy given occasional min A over 2 sessions Baseline: 11-07-21, 11-12-21 Goal status: Met   5.  Pt will utilize dysarthria compensations in 5-10 minute conversation to optimize vocal intensity and clarity given occasional min A over 2 sessions  Baseline: 11-07-21, 11-25-21 Goal status: Met   6.  Pt will utilize internal/external memory and attention aids to optimize medication management and completion of daily tasks given occasional min A over 2 sessions  Baseline: 11-25-21 Goal status: Partially met   LONG TERM GOALS: Target date: 12/19/2021     Pt will complete Speak Out! HEP at least 1x/day (BID recommended) > 1 week  Baseline: 12-23-21 Goal status: MET   2.  Pt will achieve targeted dB levels in demonstration of Speak Out! Lessons with 90% accuracy given rare min A over 2 sessions  Baseline:12-15-21; 12-23-21 Goal status: MET   3.  Pt will utilize dysarthria compensations in 15+ minute conversation to optimize vocal intensity and clarity given rare min A over 2 sessions Baseline: 12-15-21; 12-23-21 Goal status: MET   4.  Pt will utilize internal/external memory and attention aids to optimize medication management and completion of daily tasks given rare min A over 2 sessions  Baseline: 12/11/21; 12-23-21 Goal status: MET   5.  Pt will report improved cognitive communication via PROM by 2 point improvements by last ST session  Baseline: CF=84, CPIB=5; 12-23-21 CPIB 15 Goal status: MET       ASSESSMENT:   CLINICAL IMPRESSION: "Ronald Noble" is a 76 y.o. male who was seen today for dysarthria and cognitive changes related to progressive Parkinson's Disease. SLP provided education and instruction of Speak Out! Program and principles to maximize vocal intensity and clarity secondary to PD related  speech changes.Overall improved volume in HEP and structured tasks, with mild improvements in volume in spontaneous conversation observed. Pt benefited from repetition of information to aid recall and accurate completion. SLP assisted pt in generating and implementing cognitive assistive devices based on pt preference, needs, and abilities to maximize medication administration and recall of events and daily tasks. Pt agreeable to ST d/c.    OBJECTIVE IMPAIRMENTS  Objective impairments include attention, memory, executive functioning, and dysarthria. These impairments are limiting patient from managing medications, household responsibilities, and effectively communicating at home and in community.Factors affecting potential to achieve goals and functional outcome are ability to learn/carryover information, cooperation/participation level, and medical prognosis. Patient will benefit from skilled SLP services to address above impairments and improve overall function.   REHAB POTENTIAL: Fair - hx of reduced HEP compliance    PLAN: SLP FREQUENCY: 2x/week   SLP DURATION: 8 weeks   PLANNED INTERVENTIONS: Cueing hierachy, Cognitive reorganization, Internal/external aids, Functional tasks, Multimodal communication approach, SLP instruction and  feedback, Compensatory strategies, and Patient/family education   Su Monks, [place] 12/23/2021, 10:25 AM

## 2021-12-25 ENCOUNTER — Encounter: Payer: Self-pay | Admitting: Physical Therapy

## 2021-12-25 ENCOUNTER — Ambulatory Visit: Payer: Medicare Other | Admitting: Occupational Therapy

## 2021-12-25 ENCOUNTER — Ambulatory Visit: Payer: Medicare Other | Admitting: Physical Therapy

## 2021-12-25 ENCOUNTER — Encounter: Payer: Medicare Other | Admitting: Speech Pathology

## 2021-12-25 DIAGNOSIS — R29898 Other symptoms and signs involving the musculoskeletal system: Secondary | ICD-10-CM

## 2021-12-25 DIAGNOSIS — R29818 Other symptoms and signs involving the nervous system: Secondary | ICD-10-CM | POA: Diagnosis not present

## 2021-12-25 DIAGNOSIS — M25611 Stiffness of right shoulder, not elsewhere classified: Secondary | ICD-10-CM

## 2021-12-25 DIAGNOSIS — R2681 Unsteadiness on feet: Secondary | ICD-10-CM

## 2021-12-25 DIAGNOSIS — R293 Abnormal posture: Secondary | ICD-10-CM

## 2021-12-25 DIAGNOSIS — R41844 Frontal lobe and executive function deficit: Secondary | ICD-10-CM

## 2021-12-25 DIAGNOSIS — R278 Other lack of coordination: Secondary | ICD-10-CM

## 2021-12-25 DIAGNOSIS — R41840 Attention and concentration deficit: Secondary | ICD-10-CM

## 2021-12-25 DIAGNOSIS — R2689 Other abnormalities of gait and mobility: Secondary | ICD-10-CM

## 2021-12-25 NOTE — Therapy (Signed)
OUTPATIENT PHYSICAL THERAPY NEURO TREATMENT   Patient Name: Ronald Noble MRN: [id number] DOB:1946-03-08, 76 y.o., male Today's Date: 12/25/2021   PCP: [name], MD REFERRING PROVIDER: Star Age, MD      PT End of Session - 12/25/21 1021     Visit Number 15    Number of Visits 25   Recert   Date for PT Re-Evaluation 45/80/99   Recert   Authorization Type Medicare & BCBS    PT Start Time 1018    Equipment Utilized During Treatment Gait belt    Activity Tolerance Patient tolerated treatment well    Behavior During Therapy [facility] for tasks assessed/performed;Impulsive                     Past Medical History:  Diagnosis Date   BPH (benign prostatic hyperplasia)    COVID 04/24/2020   scratchy throat x 1 day all symptoms resolved   Parkinson disease ([place])    Skin cancer of face    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   Sleep apnea    mild no cpap used   Urethral stricture    Past Surgical History:  Procedure Laterality Date   BURR HOLE W/ STEREOTACTIC INSERTION OF DBS LEADS / INTRAOP MICROELECTRODE RECORDING  04/2014   [facility]   COLONOSCOPY  2019   [place] N/A 06/15/2019   Procedure: CYSTOSCOPY WITH URETHRAL DILATATION;  Surgeon: Franchot Gallo, MD;  Location: [facility];  Service: Urology;  Laterality: N/A;  [place] N/A 05/09/2020   Procedure: CYSTOSCOPY WITH BALLOON DILATATION OF POSTERIOR URETHRAL STRICTURE, FLUOROSCOPIC INTERPRETATION;  Surgeon: Franchot Gallo, MD;  Location: [facility];  Service: Urology;  Laterality: N/A;   KNEE SURGERY  yrs ago   exploratory/left   SKIN CANCER EXCISION  01/2014   face   TRANSURETHRAL RESECTION OF PROSTATE     2008   Patient Active Problem List   Diagnosis Date Noted   Rib pain on right side 12/10/2020   Hyperlipidemia 10/11/2020   Bilateral olecranon bursitis  07/05/2019   Other bursitis of knee, left knee 12/06/2018   Pain in right leg 12/06/2018   Parkinson disease ([place])     ONSET DATE: 10/02/2021 (date of referral)  REFERRING DIAG: G20 (ICD-10-CM) - Parkinson's disease ([place])  THERAPY DIAG:  Other symptoms and signs involving the nervous system  Abnormal posture  Unsteadiness on feet  Other abnormalities of gait and mobility  Rationale for Evaluation and Treatment Rehabilitation  SUBJECTIVE:  SUBJECTIVE STATEMENT: Dyskinesias are worse today due to his medication. Forgot his cane today due to being in a rush to get out of the house. Was walking with his wife down a dirt road and went to pick up a 10' stick and got his feet twisted up. Pt reports that he skinned his knee a little bit. Was able to get up by himself from the ground. Have been on the phone with the U-Step Rollator vendors and is getting It worked out with insurance.   Pt accompanied by: self  PERTINENT HISTORY: PD 2008, bilateral DBS (2018); hyperlipidemia   Pt with facial dystonias/dyskinesias with his medications that make it difficult to communicate and keep his eyes open (happens approx. 45 minutes after taking meds)  PAIN:  Are you having pain? Yes: NPRS scale: 7/10 Pain location: L shoulder Pain description: Achy Aggravating factors: Lifting it up    PRECAUTIONS: Fall Bilateral DBS, No driving, freezing/festination episodes  FALLS: Has patient fallen in last 6 months? Yes. Number of falls Yes. Number of falls 2 with ED visits and stumbles/multiple other falls  (1-2x/month)   PLOF: Independent with basic ADLs, Independent with household mobility without device, and Independent with community mobility with device Independent with basic ADLs, Vocation/Vocational requirements:  retired in March 2023, and Leisure: [employer] 2x/wk, stretching/yoga (and therapy exercises), walk (range is limited approx 2 blocks--uses hiking poles)  PATIENT GOALS Wants to work on slowing down.   OBJECTIVE:  TODAY'S TREATMENT:  Therapeutic Exercise:  SciFit with BUE/BLE at gear 4.0 for 8 minutes for ROM ,reciprocal movement patterns, activity tolerance/aerobic activity. Cues for full ROM into knee extension. Pt reporting 5-6/10 effort level. Cued for a 6-7/10.   NMR: On air ex in // bars:  -alternating posterior stepping and weight shift x12 reps each leg, alternating forward stepping x12 reps each leg, cues for incr foot clearance, pt needing intermittent UE support as needed for balance.  -with wide BOS trunk rotations with reaching across body to grab bean bag and then PWR Up for tall posture when going to midline and tossing it into crate x15 reps each side. Cues for opening up hands when grabbing bean bag, pt did well with this.  On level ground in // bars: Forward marching x5 reps with focus on tall posture, slowed pace for SLS. Pt needing UE support for balance for controlled SLS, when trying without pt unable to hold >1 second, retro gait x5 reps with cues for slowed pace and hip hinge/incr step length.  10 reps on air ex sit <> stands with no UE support, focus on eccentric control when lowering and deliberate forward lean before standing. In standing holding tall posture for 5 seconds.   Gait  Gait pattern: step through pattern, decreased step length- Right, decreased stride length, decreased hip/knee flexion- Right, decreased ankle dorsiflexion- Right, lateral lean- Right, trunk flexed, narrow BOS, and poor foot clearance- Right Distance walked: Into and out of clinic as well as distances in clinic.  Assistive device utilized:  No AD  Level of assistance: CGA  Comments: Pt with significant facial dyskinesias today and having his eyes closed at times during gait. Pt forgot his  SPC at home as he was running late. Pt providing close CGA with cues for slowed pace, focus on foot clearance and paying attention to the task of walking. Cues to make sure pt always has an AD for safety. Per pt he is working on getting a Arts development officer.   PATIENT  EDUCATION: Education details: Importance of using an AD at all times.  Person educated: Patient Education method: Explanation Education comprehension: verbalized understanding and needs further education   HOME EXERCISE PROGRAM: From previous bout of therapy: Seated and standing PWR Up, Quadruped PWR moves on floor, [place]  Access Code: [email or web address] URL: [email or web address] Date: 10/27/2021 Prepared by: Mickie Bail Plaster  Exercises - Standing on one leg in corner   - 1 x daily - 7 x weekly - 1 sets - 3 reps - 30-45 second hold - Sit to stand with band pull-apart   - 1 x daily - 7 x weekly - 3 sets - 10 reps    GOALS: Goals reviewed with patient? Yes  NEW LONG TERM GOALS FOR UPDATED POC:  Target date: 01/12/2022  Pt will be independent with final HEP for improved strength, balance, transfers, and gait.   Baseline:  Goal status: IN PROGRESS  2.  Pt will ambulate greater than or equal to 1500' feet on 6MWT with U-step walker mod I for improved cardiovascular endurance and BLE strength.   Baseline: 1260' w/U-step and S* Goal status: INITIAL   ASSESSMENT:  CLINICAL IMPRESSION: Pt with significant facial dyskinesias during session today due to timing with his medications and having his eyes closed. Pt ambulated in with no AD and pt needing to provide CGA and cues for slowed pace. Continued to educate on importance of use of an AD at all times. Worked on balance strategies for retro walking, SLS, unlevel surfaces and functional strengthening. Pt needing CGA/min A as needed for balance. Pt challenged by stepping strategies on foam, needing cues for incr foot clearance. Will continue to progress towards LTGs.     PERSONAL FACTORS Behavior pattern, Past/current experiences, Time since onset of injury/illness/exacerbation, and 3+ comorbidities: PD 2008, bilateral DBS (2018); hyperlipidemia, dystonia/dyskinesias  are also affecting patient's functional outcome.    PLAN: PT FREQUENCY: 2x/week  PT DURATION: 12 weeks + 4 weeks (recert)  PLANNED INTERVENTIONS: Therapeutic exercises, Therapeutic activity, Neuromuscular re-education, Balance training, Gait training, Patient/Family education, Self Care, Stair training, and DME instructions  PLAN FOR NEXT SESSION:   Continue gait training w/U-step (obstacles, turns, inclines/declines). Work on sit <> stands with incr forward lean. Stepping/foot clearance tasks. SLS for balance. Unlevel surfaces. Work on posture and midline orientation. Wider BOS. Balance on unlevel surfaces   Arliss Journey, PT, DPT 12/25/2021, 10:22 AM

## 2021-12-25 NOTE — Therapy (Signed)
OUTPATIENT OCCUPATIONAL THERAPY NEURO Treatment  Patient Name: Ronald Noble MRN: [id number] DOB:May 25, 1945, 76 y.o., male Today's Date: 12/25/2021  PCP: Dr. [name] REFERRING PROVIDER: Dr. Star Age   OT End of Session - 12/25/21 1221     Visit Number 14    Number of Visits 17    Date for OT Re-Evaluation 01/18/22    Authorization Type Medicare and BCBS--covered 100%    Authorization - Visit Number 14    Authorization - Number of Visits 20    Progress Note Due on Visit 20    OT Start Time 1115    OT Stop Time 1200    OT Time Calculation (min) 45 min    Activity Tolerance Patient tolerated treatment well    Behavior During Therapy [facility] for tasks assessed/performed;Impulsive                      Past Medical History:  Diagnosis Date   BPH (benign prostatic hyperplasia)    COVID 04/24/2020   scratchy throat x 1 day all symptoms resolved   Parkinson disease ([place])    Skin cancer of face    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   Sleep apnea    mild no cpap used   Urethral stricture    Past Surgical History:  Procedure Laterality Date   BURR HOLE W/ STEREOTACTIC INSERTION OF DBS LEADS / INTRAOP MICROELECTRODE RECORDING  04/2014   [facility] in [facility]   COLONOSCOPY  2019   [place] N/A 06/15/2019   Procedure: CYSTOSCOPY WITH URETHRAL DILATATION;  Surgeon: Franchot Gallo, MD;  Location: [facility];  Service: Urology;  Laterality: N/A;  [place] N/A 05/09/2020   Procedure: CYSTOSCOPY WITH BALLOON DILATATION OF POSTERIOR URETHRAL STRICTURE, FLUOROSCOPIC INTERPRETATION;  Surgeon: Franchot Gallo, MD;  Location: [facility];  Service: Urology;  Laterality: N/A;   KNEE SURGERY  yrs ago   exploratory/left   SKIN CANCER EXCISION  01/2014   face   TRANSURETHRAL RESECTION OF PROSTATE     2008   Patient Active Problem List    Diagnosis Date Noted   Rib pain on right side 12/10/2020   Hyperlipidemia 10/11/2020   Bilateral olecranon bursitis 07/05/2019   Other bursitis of knee, left knee 12/06/2018   Pain in right leg 12/06/2018   Parkinson disease ([place])     ONSET DATE: 10/02/21  (referral date, recommended from previous therapy d/c)  REFERRING DIAG: Parkinson's Disease   THERAPY DIAG:  Other lack of coordination  Other symptoms and signs involving the nervous system  Other symptoms and signs involving the musculoskeletal system  Stiffness of right shoulder, not elsewhere classified  Abnormal posture  Attention and concentration deficit  Unsteadiness on feet  Frontal lobe and executive function deficit  Rationale for Evaluation and Treatment Rehabilitation  SUBJECTIVE:   SUBJECTIVE STATEMENT: Pt reports that he fell and scraped his knee dragging a limb off dirt road.     Goes by "[name]"  Pt accompanied by: self  PERTINENT HISTORY: Parkinson's Disease s/p bilateral DBS  PMH:  hx of skin CA, sleep apnea, bilateral DBS   PRECAUTIONS: Fall and Other: Bilateral DBS  WEIGHT BEARING RESTRICTIONS No  PAIN:  Are you having pain? No and Yes: NPRS scale: 2-3/10 Pain location: L shoulder Pain description: sharp at times Aggravating factors: raising arm Relieving factors: rest  FALLS: Has patient fallen in last  6 months? Yes. Number of falls 2 with ED visits and stumbles/multiple other falls  (1-2x/month)  LIVING ENVIRONMENT: Lives with: lives with their spouse  PLOF: Independent with basic ADLs, Vocation/Vocational requirements: retired in March 2023, and Leisure: [employer] 2x/wk, stretching/yoga (and therapy exercises), walk (range is limited approx 2 blocks--uses hiking poles)  PATIENT GOALS improve coordination, ADLs  OBJECTIVE:   EVAL OBSERVATIONS: Pt reports facial dystonias associated with medications which make it difficult to communicate and difficult to keep eyes open.  Pt  with hx of dystonia/dyskinesias depending on medication cycle.  Bradykinesia noted, impulsivity noted at times.   TODAY'S TREATMENT:  Pt with significant dyskinesias at beginning of session Attempted seated shoulder flexion seated however pt with significant pain, so transitioned into supine Supine PWR! Up and rock Lower trunk rotaiton with mod facilitation v.c  Seated low range chest press, then reach for the floor min v.c and facilitation Seated external rotation with scapular retraction, min-mod faciliation/ v.c , then trunk rotation, min vc. And facilltation     PATIENT EDUCATION: Education details: recommendation that pt does not use weights due to shoulder pain Person educated: Patient Education method: Explanation, Demonstration, and Verbal cues Education comprehension: verbalized understanding, returned demonstration, and verbal cues required    HOME EXERCISE PROGRAM: Coordination HEP  Supine closed chain shoulder flexion and chest press with paper towel roll, min-mod v.c 11/18/21:  PWR! Moves (basic 4) in modified quadruped with emphasis and cueing on posture and shoulder positioning  11/25/21:  awareness of postural alignment and affect of functional activities as well recommendations for use of mirror as feedback  12/04/21:  Closed-chain shoulder flex with ball in front of mirror with focus on posture/alignment; Keeping Thinking Skills [employer] Compensation Strategies  12/16/21:  donning/doffing jacket with large amplitude movement strategies  GOALS: Potential Goals reviewed with patient? Yes  SHORT TERM GOALS: Target date: 11/20/21  Pt will be independent be updated PD-specific HEP. Goal status: MET.  12/23/21  2.  Pt will demo at least 135* R shoulder flex for functional reaching  Baseline:  125* Goal status: MET  11/27/21  130*   12/16/21:  140* (elbow ext WNL).  L-135* with elbow ext WNL  3.  Pt will write at least 4 sentences with good size/legibility. Goal  status: MET  11/27/21:  met--no decr in size and improved legibility  4.  Pt will improve L hand coordination for ADLs as shown by improving time on 9-hole peg test by at least 5sec. Baseline:  47.09sec Goal status: IN PROGRESS/Not consistent  11/27/21:  52.41, 42sec.    5.  Pt will verbalize understanding of memory/cognitive compensation strategies. Goal status: MET  11/27/21--not yet issued.  12/04/21:  met   LONG TERM GOALS: Target date: 01/19/22  Pt will be independent with strategies/AE for ADLs/IADLs.(Including strategy for donning/ doffing jacket) Goal status: IN PROGRESS  reviewed strategy for donning/doffing jacket, buttons, sit>stand, functional reach  2.  Pt will improve L hand coordination for ADLs as shown by improving time on 9-hole peg test by at least 8sec. Baseline: 47.09sec Goal status: INITIAL  3.  Pt will improve bilateral functional reaching/coordination as shown by improving score on box and blocks test by at 4 blocks with LUE. Baseline: L-44 blocks Goal status: MET  12/16/21:  55 blocks  4.  Pt will improve bilateral functional reaching/coordination as shown by improving score on box and blocks test by at 4 blocks with RUE. Baseline:  R-47 blocks Goal status: MET  12/16/21:  55 blocks  5.  Pt will increase bilateral standing functional reach to 10 inches or greater to minimize fall risk. Baseline: R 9.5 L 8.5 Goal status: INITIAL  6.  Pt will demonstrate improved ease with fastening buttons as evidenced by decreasing 3 button/ unbutton time to :49 secs or less. Baseline: 53.46 Goal status: MET  12/23/21:  43.40sec     ASSESSMENT:  CLINICAL IMPRESSION: Pt is progressing slowly towards goals.  Pt was limited by pain and dyskinesias today.  PERFORMANCE DEFICITS in functional skills including ADLs, IADLs, coordination, dexterity, tone, ROM, flexibility, FMC, GMC, mobility, balance, body mechanics, endurance, decreased knowledge of precautions, decreased knowledge  of use of DME, and UE functional use, cognitive skills including attention, memory, perception, and safety awareness, and psychosocial skills including environmental adaptation and habits.   IMPAIRMENTS are limiting patient from ADLs, IADLs, and leisure.   COMORBIDITIES may have co-morbidities  that affects occupational performance. Patient will benefit from skilled OT to address above impairments and improve overall function.  MODIFICATION OR ASSISTANCE TO COMPLETE EVALUATION: Min-Moderate modification of tasks or assist with assess necessary to complete an evaluation.  OT OCCUPATIONAL PROFILE AND HISTORY: Detailed assessment: Review of records and additional review of physical, cognitive, psychosocial history related to current functional performance.  CLINICAL DECISION MAKING: Moderate - several treatment options, min-mod task modification necessary  REHAB POTENTIAL: Good  EVALUATION COMPLEXITY: Moderate   PLAN: OT FREQUENCY: 2x/week  OT DURATION: 8 weeks +eval or 17 visits over 12 weeks for scheduling  PLANNED INTERVENTIONS: self care/ADL training, therapeutic exercise, therapeutic activity, neuromuscular re-education, manual therapy, passive range of motion, balance training, functional mobility training, aquatic therapy, splinting, ultrasound, paraffin, fluidotherapy, moist heat, cryotherapy, patient/family education, cognitive remediation/compensation, energy conservation, and DME and/or AE instructions  RECOMMENDED OTHER SERVICES: current with PT and ST (evals today)  CONSULTED AND AGREED WITH PLAN OF CARE: Patient  PLAN FOR NEXT SESSION:  (scheduled through Oct 2), coordination with focus on posture, ADL strategies, continue to address remaining goals   Lynleigh Kovack, OTR/L 12/25/2021, 12:24 PM [phone] Phone: [phone] 12:24 PM 12/25/21

## 2021-12-28 NOTE — Therapy (Signed)
OUTPATIENT OCCUPATIONAL THERAPY NEURO Treatment  Patient Name: Ronald Noble MRN: [id number] DOB:[birth year], 76 y.o., male Today's Date: 12/29/2021  PCP: Dr. [name] REFERRING PROVIDER: Dr. Star Age   OT End of Session - 12/29/21 0853     Visit Number 15    Number of Visits 17    Date for OT Re-Evaluation 01/18/22    Authorization Type Medicare and BCBS--covered 100%    Authorization - Visit Number 15    Authorization - Number of Visits 20    Progress Note Due on Visit 20    OT Start Time [phone]    OT Stop Time 0930    OT Time Calculation (min) 38 min    Activity Tolerance Patient tolerated treatment well    Behavior During Therapy [facility] for tasks assessed/performed;Impulsive                       Past Medical History:  Diagnosis Date   BPH (benign prostatic hyperplasia)    COVID 04/24/2020   scratchy throat x 1 day all symptoms resolved   Parkinson disease ([place])    Skin cancer of face    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   Sleep apnea    mild no cpap used   Urethral stricture    Past Surgical History:  Procedure Laterality Date   BURR HOLE W/ STEREOTACTIC INSERTION OF DBS LEADS / INTRAOP MICROELECTRODE RECORDING  04/2014   [facility] in [facility]   COLONOSCOPY  2019   [place] N/A 06/15/2019   Procedure: CYSTOSCOPY WITH URETHRAL DILATATION;  Surgeon: Franchot Gallo, MD;  Location: [facility];  Service: Urology;  Laterality: N/A;  [place] N/A 05/09/2020   Procedure: CYSTOSCOPY WITH BALLOON DILATATION OF POSTERIOR URETHRAL STRICTURE, FLUOROSCOPIC INTERPRETATION;  Surgeon: Franchot Gallo, MD;  Location: [facility];  Service: Urology;  Laterality: N/A;   KNEE SURGERY  yrs ago   exploratory/left   SKIN CANCER EXCISION  01/2014   face   TRANSURETHRAL RESECTION OF PROSTATE     2008   Patient Active Problem List    Diagnosis Date Noted   Rib pain on right side 12/10/2020   Hyperlipidemia 10/11/2020   Bilateral olecranon bursitis 07/05/2019   Other bursitis of knee, left knee 12/06/2018   Pain in right leg 12/06/2018   Parkinson disease ([place])     ONSET DATE: 10/02/21  (referral date, recommended from previous therapy d/c)  REFERRING DIAG: Parkinson's Disease   THERAPY DIAG:  Other symptoms and signs involving the nervous system  Abnormal posture  Unsteadiness on feet  Other lack of coordination  Other symptoms and signs involving the musculoskeletal system  Stiffness of right shoulder, not elsewhere classified  Attention and concentration deficit  Frontal lobe and executive function deficit  Rationale for Evaluation and Treatment Rehabilitation  SUBJECTIVE:   SUBJECTIVE STATEMENT: No falls, incr shoulder pain reported   Goes by "[name]"  Pt accompanied by: self  PERTINENT HISTORY: Parkinson's Disease s/p bilateral DBS  PMH:  hx of skin CA, sleep apnea, bilateral DBS   PRECAUTIONS: Fall and Other: Bilateral DBS  WEIGHT BEARING RESTRICTIONS No  PAIN:  Are you having pain? No and Yes: NPRS scale: 5/10 Pain location: L shoulder Pain description: sharp at times Aggravating factors: raising arm Relieving factors: rest  FALLS: Has patient fallen in last 6 months? Yes. Number of falls 2 with ED visits  and stumbles/multiple other falls  (1-2x/month)  LIVING ENVIRONMENT: Lives with: lives with their spouse  PLOF: Independent with basic ADLs, Vocation/Vocational requirements: retired in March 2023, and Leisure: [employer] 2x/wk, stretching/yoga (and therapy exercises), walk (range is limited approx 2 blocks--uses hiking poles)  PATIENT GOALS improve coordination, ADLs  OBJECTIVE:   EVAL OBSERVATIONS: Pt reports facial dystonias associated with medications which make it difficult to communicate and difficult to keep eyes open.  Pt with hx of dystonia/dyskinesias depending on  medication cycle.  Bradykinesia noted, impulsivity noted at times.   TODAY'S TREATMENT:   In supine, scapular retraction (PWR! Up) and depression x10 each with 5sec hold with min cueing.  Closed-chain chest press with min facilitation.  Unable to perform closed-chain shoulder flex due to pain today.    Sitting, PWR! Up with min cueing for L shoulder depression and ER with supination and mid-line alignment.  Completing Purdue Pegboard with both hands simultaneous with min-mod/cues difficulty for incr coordination, use of PWR! Hands, timing, and posture (with mirror).  Checked standing functional reach test and 9-hole peg test--see goal section below.    Reviewed safety with sit>stand and sit>stand from table (scoot back first), pt returned demo after review/min cueing.     HOME EXERCISE PROGRAM: Coordination HEP  Supine closed chain shoulder flexion and chest press with paper towel roll, min-mod v.c 11/18/21:  PWR! Moves (basic 4) in modified quadruped with emphasis and cueing on posture and shoulder positioning  11/25/21:  awareness of postural alignment and affect of functional activities as well recommendations for use of mirror as feedback  12/04/21:  Closed-chain shoulder flex with ball in front of mirror with focus on posture/alignment; Keeping Thinking Skills [place] [employer] Compensation Strategies  12/16/21:  donning/doffing jacket with large amplitude movement strategies  GOALS: Potential Goals reviewed with patient? Yes  SHORT TERM GOALS: Target date: 11/20/21  Pt will be independent be updated PD-specific HEP. Goal status: MET.  12/23/21  2.  Pt will demo at least 135* R shoulder flex for functional reaching  Baseline:  125* Goal status: MET  11/27/21  130*   12/16/21:  140* (elbow ext WNL).  L-135* with elbow ext WNL  3.  Pt will write at least 4 sentences with good size/legibility. Goal status: MET  11/27/21:  met--no decr in size and improved legibility  4.  Pt will  improve L hand coordination for ADLs as shown by improving time on 9-hole peg test by at least 5sec. Baseline:  47.09sec Goal status: Partially met/Not consistent  11/27/21:  52.41, 42sec.  12/29/21:  43.15sec, 37.28sec  5.  Pt will verbalize understanding of memory/cognitive compensation strategies. Goal status: MET  11/27/21--not yet issued.  12/04/21:  met   LONG TERM GOALS: Target date: 01/19/22  Pt will be independent with strategies/AE for ADLs/IADLs.(Including strategy for donning/ doffing jacket) Goal status: IN PROGRESS  reviewed strategy for donning/doffing jacket, buttons, sit>stand, functional reach  2.  Pt will improve L hand coordination for ADLs as shown by improving time on 9-hole peg test by at least 8sec. Baseline: 47.09sec Goal status: Partially met/Not consistent.  12/29/21:  43.15sec, 37.28sec  3.  Pt will improve bilateral functional reaching/coordination as shown by improving score on box and blocks test by at 4 blocks with LUE. Baseline: L-44 blocks Goal status: MET  12/16/21:  55 blocks  4.  Pt will improve bilateral functional reaching/coordination as shown by improving score on box and blocks test by at 4 blocks with RUE.  Baseline:  R-47 blocks Goal status: MET  12/16/21:  55 blocks  5.  Pt will increase bilateral standing functional reach to 10 inches or greater to minimize fall risk. Baseline: R 9.5 L 8.5 Goal status: IN PROGRESS.  12/29/21   R-9", L-10"  6.  Pt will demonstrate improved ease with fastening buttons as evidenced by decreasing 3 button/ unbutton time to :49 secs or less. Baseline: 53.46 Goal status: MET  12/23/21:  43.40sec     ASSESSMENT:  CLINICAL IMPRESSION: Pt is progressing slowly towards goals.  Pt was limited by pain today.   PERFORMANCE DEFICITS in functional skills including ADLs, IADLs, coordination, dexterity, tone, ROM, flexibility, FMC, GMC, mobility, balance, body mechanics, endurance, decreased knowledge of precautions,  decreased knowledge of use of DME, and UE functional use, cognitive skills including attention, memory, perception, and safety awareness, and psychosocial skills including environmental adaptation and habits.   IMPAIRMENTS are limiting patient from ADLs, IADLs, and leisure.   COMORBIDITIES may have co-morbidities  that affects occupational performance. Patient will benefit from skilled OT to address above impairments and improve overall function.  MODIFICATION OR ASSISTANCE TO COMPLETE EVALUATION: Min-Moderate modification of tasks or assist with assess necessary to complete an evaluation.  OT OCCUPATIONAL PROFILE AND HISTORY: Detailed assessment: Review of records and additional review of physical, cognitive, psychosocial history related to current functional performance.  CLINICAL DECISION MAKING: Moderate - several treatment options, min-mod task modification necessary  REHAB POTENTIAL: Good  EVALUATION COMPLEXITY: Moderate   PLAN: OT FREQUENCY: 2x/week  OT DURATION: 8 weeks +eval or 17 visits over 12 weeks for scheduling  PLANNED INTERVENTIONS: self care/ADL training, therapeutic exercise, therapeutic activity, neuromuscular re-education, manual therapy, passive range of motion, balance training, functional mobility training, aquatic therapy, splinting, ultrasound, paraffin, fluidotherapy, moist heat, cryotherapy, patient/family education, cognitive remediation/compensation, energy conservation, and DME and/or AE instructions  RECOMMENDED OTHER SERVICES: current with PT and ST (evals today)  CONSULTED AND AGREED WITH PLAN OF CARE: Patient  PLAN FOR NEXT SESSION:  check remaining goals and anticipate d/c; schedule follow-up appt     [facility], OTR/L 12/29/2021, 10:27 AM [phone] Phone: [phone] 10:27 AM 12/29/21

## 2021-12-29 ENCOUNTER — Ambulatory Visit: Payer: Medicare Other | Admitting: Physical Therapy

## 2021-12-29 ENCOUNTER — Encounter: Payer: Medicare Other | Admitting: Speech Pathology

## 2021-12-29 ENCOUNTER — Ambulatory Visit: Payer: Medicare Other | Admitting: Occupational Therapy

## 2021-12-29 ENCOUNTER — Encounter: Payer: Self-pay | Admitting: Occupational Therapy

## 2021-12-29 DIAGNOSIS — R278 Other lack of coordination: Secondary | ICD-10-CM

## 2021-12-29 DIAGNOSIS — R29898 Other symptoms and signs involving the musculoskeletal system: Secondary | ICD-10-CM | POA: Diagnosis not present

## 2021-12-29 DIAGNOSIS — R2681 Unsteadiness on feet: Secondary | ICD-10-CM

## 2021-12-29 DIAGNOSIS — R29818 Other symptoms and signs involving the nervous system: Secondary | ICD-10-CM

## 2021-12-29 DIAGNOSIS — M25611 Stiffness of right shoulder, not elsewhere classified: Secondary | ICD-10-CM

## 2021-12-29 DIAGNOSIS — R293 Abnormal posture: Secondary | ICD-10-CM

## 2021-12-29 DIAGNOSIS — R2689 Other abnormalities of gait and mobility: Secondary | ICD-10-CM

## 2021-12-29 DIAGNOSIS — R41840 Attention and concentration deficit: Secondary | ICD-10-CM

## 2021-12-29 DIAGNOSIS — R41844 Frontal lobe and executive function deficit: Secondary | ICD-10-CM

## 2021-12-29 NOTE — Therapy (Signed)
OUTPATIENT PHYSICAL THERAPY NEURO TREATMENT   Patient Name: Ronald Noble MRN: [id number] DOB:[birth year], 76 y.o., male Today's Date: 12/29/2021   PCP: [name], MD REFERRING PROVIDER: Star Age, MD      PT End of Session - 12/29/21 0805     Visit Number 16    Number of Visits 25   Recert   Date for PT Re-Evaluation 08/65/78   Recert   Authorization Type Medicare & BCBS    PT Start Time 0802    PT Stop Time 0845    PT Time Calculation (min) 43 min    Equipment Utilized During Treatment Gait belt    Activity Tolerance Patient tolerated treatment well    Behavior During Therapy WFL for tasks assessed/performed;Impulsive                     Past Medical History:  Diagnosis Date   BPH (benign prostatic hyperplasia)    COVID 04/24/2020   scratchy throat x 1 day all symptoms resolved   Parkinson disease ([place])    Skin cancer of face    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   Sleep apnea    mild no cpap used   Urethral stricture    Past Surgical History:  Procedure Laterality Date   BURR HOLE W/ STEREOTACTIC INSERTION OF DBS LEADS / INTRAOP MICROELECTRODE RECORDING  04/2014   [facility] in [facility]   COLONOSCOPY  2019   [place] N/A 06/15/2019   Procedure: CYSTOSCOPY WITH URETHRAL DILATATION;  Surgeon: Franchot Gallo, MD;  Location: [facility];  Service: Urology;  Laterality: N/A;  [place] N/A 05/09/2020   Procedure: CYSTOSCOPY WITH BALLOON DILATATION OF POSTERIOR URETHRAL STRICTURE, FLUOROSCOPIC INTERPRETATION;  Surgeon: Franchot Gallo, MD;  Location: [facility];  Service: Urology;  Laterality: N/A;   KNEE SURGERY  yrs ago   exploratory/left   SKIN CANCER EXCISION  01/2014   face   TRANSURETHRAL RESECTION OF PROSTATE     2008   Patient Active Problem List   Diagnosis Date Noted   Rib pain on right side 12/10/2020    Hyperlipidemia 10/11/2020   Bilateral olecranon bursitis 07/05/2019   Other bursitis of knee, left knee 12/06/2018   Pain in right leg 12/06/2018   Parkinson disease ([place])     ONSET DATE: 10/02/2021 (date of referral)  REFERRING DIAG: G20 (ICD-10-CM) - Parkinson's disease ([place])  THERAPY DIAG:  Unsteadiness on feet  Other abnormalities of gait and mobility  Abnormal posture  Rationale for Evaluation and Treatment Rehabilitation  SUBJECTIVE:  SUBJECTIVE STATEMENT: Pt reports he is doing well, is unhappy with his therapy schedule due to having a 45 minute gap in between sessions. No new changes or falls.   Pt accompanied by: self  PERTINENT HISTORY: PD 2008, bilateral DBS (2018); hyperlipidemia   Pt with facial dystonias/dyskinesias with his medications that make it difficult to communicate and keep his eyes open (happens approx. 45 minutes after taking meds)  PAIN:  Are you having pain? Yes: NPRS scale: 6/10 Pain location: L shoulder Pain description: Achy Aggravating factors: Lifting it up    PRECAUTIONS: Fall Bilateral DBS, No driving, freezing/festination episodes, no lifting >5#  FALLS: Has patient fallen in last 6 months? Yes. Number of falls Yes. Number of falls 2 with ED visits and stumbles/multiple other falls  (1-2x/month)   PLOF: Independent with basic ADLs, Independent with household mobility without device, and Independent with community mobility with device Independent with basic ADLs, Vocation/Vocational requirements: retired in March 2023, and Leisure: [employer] 2x/wk, stretching/yoga (and therapy exercises), walk (range is limited approx 2 blocks--uses hiking poles)  PATIENT GOALS Wants to work on slowing down.   OBJECTIVE:  TODAY'S TREATMENT:  Therapeutic Exercise:   SciFit multi-peaks level 8 for 8 minutes using BUE/BLEs for neural priming for reciprocal movement, dynamic cardiovascular warmup and increased amplitude of stepping. RPE of 4/10 following activity    NMR Sit <>stands w/10# ball throw (chest pass) to blue mat on ground as target for improved anterior weight shift, high amplitude movement and retropulsion correction. Min cues to avoid bracing against back of mat. Pt performed x8 reps and moved mat further from pt to facilitate more powerful throw, x8 reps. Final progression to adding blue wedge under bilateral feet to bias posterior lean, x7 reps. Pt with increased difficulty having powerful throw when standing on wedge.  Farmer's carries using 15# KB, 345' holding on R and 345' on L. Noted improved postural control w/weight in hand as it provides tactile cue for core activation. Pt demonstrated improved midline orientation bilaterally, regardless of side in which he held KB. Noted increased shuffling of gait when holding on R side > L side. CGA throughout for safety   Gait  Gait pattern: step through pattern, decreased step length- Right, decreased stride length, decreased hip/knee flexion- Right, decreased ankle dorsiflexion- Right, lateral lean- Right, trunk flexed, narrow BOS, and poor foot clearance- Right Distance walked: Into and out of clinic as well as distances in clinic.  Assistive device utilized: SPC and no AD  Level of assistance: CGA Comments: Min cues to facilitate arm swing bilaterally and for increased step length. Pt reports he can hear when his shoes scuff and that cues him to increase step length   PATIENT EDUCATION: Education details: Importance of using an AD at all times. Discussed POC moving forward, as pt unhappy with having a 45 minute gap between PT/OT on 10/2. Informed pt that OT is fully booked and we are unable to move appointments at this time. Pt stated he would "wait until November or December". Educated pt on his  certification date and explained it is unlikely we can schedule single appointment at end of year. Notified therapy team of pt's concerns.  Person educated: Patient Education method: Explanation Education comprehension: verbalized understanding and needs further education   HOME EXERCISE PROGRAM: From previous bout of therapy: Seated and standing PWR Up, Quadruped PWR moves on floor, [place]  Access Code: [email or web address] URL: [email or web address] Date: 10/27/2021 Prepared by: Mickie Bail Rhyen Mazariego  Exercises - Standing  on one leg in corner   - 1 x daily - 7 x weekly - 1 sets - 3 reps - 30-45 second hold - Sit to stand with band pull-apart   - 1 x daily - 7 x weekly - 3 sets - 10 reps    GOALS: Goals reviewed with patient? Yes  NEW LONG TERM GOALS FOR UPDATED POC:  Target date: 01/12/2022  Pt will be independent with final HEP for improved strength, balance, transfers, and gait.   Baseline:  Goal status: IN PROGRESS  2.  Pt will ambulate greater than or equal to 1500' feet on 6MWT with U-step walker mod I for improved cardiovascular endurance and BLE strength.   Baseline: 1260' w/U-step and S* Goal status: INITIAL   ASSESSMENT:  CLINICAL IMPRESSION: Emphasis of skilled PT session on anterior weight shifting and postural control. Pt continues to demonstrate retropulsion w/initial sit <>stand but improves anterior weight shift ability w/practice. Noted pt able to maintain midline orientation w/core stability today, so educated pt on bracing abdominals w/gait rather than shifting weight from hips. Discussed potential to DC on 10/4, pt reported he would think about it. Continue POC.    PERSONAL FACTORS Behavior pattern, Past/current experiences, Time since onset of injury/illness/exacerbation, and 3+ comorbidities: PD 2008, bilateral DBS (2018); hyperlipidemia, dystonia/dyskinesias  are also affecting patient's functional outcome.    PLAN: PT FREQUENCY: 2x/week  PT  DURATION: 12 weeks + 4 weeks (recert)  PLANNED INTERVENTIONS: Therapeutic exercises, Therapeutic activity, Neuromuscular re-education, Balance training, Gait training, Patient/Family education, Self Care, Stair training, and DME instructions  PLAN FOR NEXT SESSION:   Continue gait training w/U-step (obstacles, turns, inclines/declines). Work on sit <> stands with incr forward lean. Stepping/foot clearance tasks. SLS for balance. Unlevel surfaces. Work on posture and midline orientation. Wider BOS. Balance on unlevel surfaces, groiner steps, tall > half kneel, DC on 10/4?  Cruzita Lederer Gricelda Foland, PT, DPT 12/29/2021, 8:51 AM

## 2021-12-31 ENCOUNTER — Ambulatory Visit: Payer: Medicare Other | Admitting: Physical Therapy

## 2021-12-31 ENCOUNTER — Ambulatory Visit: Payer: Medicare Other | Admitting: Occupational Therapy

## 2021-12-31 ENCOUNTER — Encounter: Payer: Medicare Other | Admitting: Speech Pathology

## 2021-12-31 DIAGNOSIS — M25611 Stiffness of right shoulder, not elsewhere classified: Secondary | ICD-10-CM | POA: Diagnosis not present

## 2021-12-31 DIAGNOSIS — R2689 Other abnormalities of gait and mobility: Secondary | ICD-10-CM

## 2021-12-31 DIAGNOSIS — R29898 Other symptoms and signs involving the musculoskeletal system: Secondary | ICD-10-CM | POA: Diagnosis not present

## 2021-12-31 DIAGNOSIS — R2681 Unsteadiness on feet: Secondary | ICD-10-CM | POA: Diagnosis not present

## 2021-12-31 DIAGNOSIS — R29818 Other symptoms and signs involving the nervous system: Secondary | ICD-10-CM

## 2021-12-31 DIAGNOSIS — R293 Abnormal posture: Secondary | ICD-10-CM | POA: Diagnosis not present

## 2021-12-31 DIAGNOSIS — R278 Other lack of coordination: Secondary | ICD-10-CM

## 2021-12-31 DIAGNOSIS — R41844 Frontal lobe and executive function deficit: Secondary | ICD-10-CM

## 2021-12-31 DIAGNOSIS — R41840 Attention and concentration deficit: Secondary | ICD-10-CM

## 2021-12-31 NOTE — Therapy (Signed)
OUTPATIENT OCCUPATIONAL THERAPY NEURO Treatment  Patient Name: Ronald Noble MRN: [id number] DOB:12-03-1945, 76 y.o., male Today's Date: 01/01/2022  PCP: Dr. Rodrigo Ran REFERRING PROVIDER: Dr. Huston Foley       Current functional level related to goals / functional outcomes: Pt made good overall progress towards goals. See goals below.   Remaining deficits: Bradykinesia, abnormal posture, decreased balance, cognitive deficits, decreased coordination, decreased functional mobility   Education / Equipment: Pt was educated in HEP, ADL strategies and safety recommendations. Pt verbalized understanding of all education.  Patient agrees to discharge. Patient goals were partially met. Patient is being discharged due to being pleased with the current functional level..      OT End of Session - 01/01/22 1616     Visit Number 16    Number of Visits 17    Date for OT Re-Evaluation 01/18/22    Authorization Type Medicare and BCBS--covered 100%    Authorization - Visit Number 16    Authorization - Number of Visits 20    Progress Note Due on Visit 20    OT Start Time 0804    OT Stop Time 0845    OT Time Calculation (min) 41 min    Activity Tolerance Patient tolerated treatment well    Behavior During Therapy [facility] for tasks assessed/performed;Impulsive                        Past Medical History:  Diagnosis Date   BPH (benign prostatic hyperplasia)    COVID 04/24/2020   scratchy throat x 1 day all symptoms resolved   Parkinson disease ([facility])    Skin cancer of face    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   Sleep apnea    mild no cpap used   Urethral stricture    Past Surgical History:  Procedure Laterality Date   BURR HOLE W/ STEREOTACTIC INSERTION OF DBS LEADS / INTRAOP MICROELECTRODE RECORDING  04/2014   [facility] in [facility]   COLONOSCOPY  2019   CYSTOSCOPY WITH URETHRAL DILATATION N/A 06/15/2019   Procedure: CYSTOSCOPY  WITH URETHRAL DILATATION;  Surgeon: Marcine Matar, MD;  [facility];  Service: Urology;  Laterality: N/A;  30 MINS   CYSTOSCOPY WITH URETHRAL DILATATION N/A 05/09/2020   Procedure: CYSTOSCOPY WITH BALLOON DILATATION OF POSTERIOR URETHRAL STRICTURE, FLUOROSCOPIC INTERPRETATION;  Surgeon: Marcine Matar, MD;  [facility];  Service: Urology;  Laterality: N/A;   KNEE SURGERY  yrs ago   exploratory/left   SKIN CANCER EXCISION  01/2014   face   TRANSURETHRAL RESECTION OF PROSTATE     2008   Patient Active Problem List   Diagnosis Date Noted   Rib pain on right side 12/10/2020   Hyperlipidemia 10/11/2020   Bilateral olecranon bursitis 07/05/2019   Other bursitis of knee, left knee 12/06/2018   Pain in right leg 12/06/2018   Parkinson disease ([facility])     ONSET DATE: 10/02/21  (referral date, recommended from previous therapy d/c)  REFERRING DIAG: Parkinson's Disease   THERAPY DIAG:  Other abnormalities of gait and mobility  Unsteadiness on feet  Abnormal posture  Other symptoms and signs involving the nervous system  Other lack of coordination  Other symptoms and signs involving the musculoskeletal system  Stiffness of right shoulder, not elsewhere classified  Attention and concentration deficit  Frontal lobe and executive function deficit  Rationale for Evaluation and Treatment Rehabilitation  SUBJECTIVE:  SUBJECTIVE STATEMENT: No falls, incr shoulder pain reported   Goes by "[name]"  Pt accompanied by: self  PERTINENT HISTORY: Parkinson's Disease s/p bilateral DBS  PMH:  hx of skin CA, sleep apnea, bilateral DBS   PRECAUTIONS: Fall and Other: Bilateral DBS  WEIGHT BEARING RESTRICTIONS No  PAIN:  Are you having pain? No and Yes: NPRS scale: none at rest, 3-4/10 with movement Pain location: L shoulder Pain description: sharp at times Aggravating factors: raising arm, use of weights Relieving factors:  rest  FALLS: Has patient fallen in last 6 months? Yes. Number of falls 2 with ED visits and stumbles/multiple other falls  (1-2x/month)  LIVING ENVIRONMENT: Lives with: lives with their spouse  PLOF: Independent with basic ADLs, Vocation/Vocational requirements: retired in March 2023, and Leisure: [employer] 2x/wk, stretching/yoga (and therapy exercises), walk (range is limited approx 2 blocks--uses hiking poles)  PATIENT GOALS improve coordination, ADLs  OBJECTIVE:   EVAL OBSERVATIONS: Pt reports facial dystonias associated with medications which make it difficult to communicate and difficult to keep eyes open.  Pt with hx of dystonia/dyskinesias depending on medication cycle.  Bradykinesia noted, impulsivity noted at times.   TODAY'S TREATMENT:   In supine, scapular retraction (PWR! Up) and depression x10 each with 5sec hold with min cueing.  PWR! Rock, and twist in supine, with cueing to minimize pain, and improved positioning.  Completing grooved Pegboard with left hands with min cues difficulty for incr coordination, use of PWR! Hands, timing, and posture (with mirror).  Checked remaining goals and reviewed donning doffing jacket with big movements, demonstration and min-mod v.c  Therapist discussed safety recommendations  with pt. and provided handout. See pt instructions     HOME EXERCISE PROGRAM: Coordination HEP  Supine closed chain shoulder flexion and chest press with paper towel roll, min-mod v.c 11/18/21:  PWR! Moves (basic 4) in modified quadruped with emphasis and cueing on posture and shoulder positioning  11/25/21:  awareness of postural alignment and affect of functional activities as well recommendations for use of mirror as feedback  12/04/21:  Closed-chain shoulder flex with ball in front of mirror with focus on posture/alignment; Keeping Thinking Skills [place] [employer] Memory Compensation Strategies  12/16/21:  donning/doffing jacket with large amplitude movement  strategies  GOALS: Potential Goals reviewed with patient? Yes  SHORT TERM GOALS: Target date: 11/20/21  Pt will be independent be updated PD-specific HEP. Goal status: MET.  12/23/21  2.  Pt will demo at least 135* R shoulder flex for functional reaching  Baseline:  125* Goal status: MET  11/27/21  130*   12/16/21:  140* (elbow ext WNL).  L-135* with elbow ext WNL  3.  Pt will write at least 4 sentences with good size/legibility. Goal status: MET  11/27/21:  met--no decr in size and improved legibility  4.  Pt will improve L hand coordination for ADLs as shown by improving time on 9-hole peg test by at least 5sec. Baseline:  47.09sec Goal status: Partially met/Not consistent  11/27/21:  52.41, 42sec.  12/29/21:  43.15sec, 37.28sec  5.  Pt will verbalize understanding of memory/cognitive compensation strategies. Goal status: MET  11/27/21--not yet issued.  12/04/21:  met   LONG TERM GOALS: Target date: 01/19/22  Pt will be independent with strategies/AE for ADLs/IADLs.(Including strategy for donning/ doffing jacket) Goal status: reviewed strategy for donning/doffing jacket, buttons, sit>stand, functional reach- MET  2.  Pt will improve L hand coordination for ADLs as shown by improving time on 9-hole peg test by  at least 8sec. Baseline: 47.09sec Goal status: Partially met/Not consistent.  12/29/21:  43.15sec, 37.28sec  3.  Pt will improve bilateral functional reaching/coordination as shown by improving score on box and blocks test by at 4 blocks with LUE. Baseline: L-44 blocks Goal status: MET  12/16/21:  55 blocks  4.  Pt will improve bilateral functional reaching/coordination as shown by improving score on box and blocks test by at 4 blocks with RUE. Baseline:  R-47 blocks Goal status: MET  12/16/21:  55 blocks  6.  Pt will demonstrate improved ease with fastening buttons as evidenced by decreasing 3 button/ unbutton time to :49 secs or less. Baseline: 53.46 Goal status: MET   12/23/21:  43.40sec     ASSESSMENT:  CLINICAL IMPRESSION: Pt demonstrates good overall progress. Pt agrees with plans for discharge.   PERFORMANCE DEFICITS in functional skills including ADLs, IADLs, coordination, dexterity, tone, ROM, flexibility, FMC, GMC, mobility, balance, body mechanics, endurance, decreased knowledge of precautions, decreased knowledge of use of DME, and UE functional use, cognitive skills including attention, memory, perception, and safety awareness, and psychosocial skills including environmental adaptation and habits.   IMPAIRMENTS are limiting patient from ADLs, IADLs, and leisure.   COMORBIDITIES may have co-morbidities  that affects occupational performance. Patient will benefit from skilled OT to address above impairments and improve overall function.  MODIFICATION OR ASSISTANCE TO COMPLETE EVALUATION: Min-Moderate modification of tasks or assist with assess necessary to complete an evaluation.  OT OCCUPATIONAL PROFILE AND HISTORY: Detailed assessment: Review of records and additional review of physical, cognitive, psychosocial history related to current functional performance.  CLINICAL DECISION MAKING: Moderate - several treatment options, min-mod task modification necessary  REHAB POTENTIAL: Good  EVALUATION COMPLEXITY: Moderate   PLAN: OT FREQUENCY: 2x/week  OT DURATION: 8 weeks +eval or 17 visits over 12 weeks for scheduling  PLANNED INTERVENTIONS: self care/ADL training, therapeutic exercise, therapeutic activity, neuromuscular re-education, manual therapy, passive range of motion, balance training, functional mobility training, aquatic therapy, splinting, ultrasound, paraffin, fluidotherapy, moist heat, cryotherapy, patient/family education, cognitive remediation/compensation, energy conservation, and DME and/or AE instructions  RECOMMENDED OTHER SERVICES: current with PT and ST (evals today)  CONSULTED AND AGREED WITH PLAN OF CARE:  Patient  PLAN FOR NEXT SESSION:  d/c OT, schedule return evals in 6 mons Leeloo Silverthorne, OTR/L 01/01/2022, 4:22 PM [phone] Phone: [phone] 4:22 PM 01/01/22

## 2021-12-31 NOTE — Patient Instructions (Signed)
At home only perform PWR! Moves exercises within pain free range of motion  Do not use weights, you need to focus more on stretching and posture Weights are likely to increase pain and reinforce rounded posture.  When getting up from a chair, slide to the front first, then lean forwards to stand(PWR! Up posture)  When stepping out from a chair always step with the foot closest to the direction you are headed, do not cross your feet, it will increase fall risk  If you are talking on your cell phone, please put it away or stop talking before walking.

## 2021-12-31 NOTE — Therapy (Signed)
[place] SUMMARY   Patient Name: Ronald Noble MRN: [id number] DOB:1946-03-10, 76 y.o., male Today's Date: 12/31/2021   PCP: [name], MD REFERRING PROVIDER: Star Age, MD  PHYSICAL THERAPY DISCHARGE SUMMARY  Visits from Start of Care: 17  Current functional level related to goals / functional outcomes: See below    Remaining deficits: Decreased safety awareness, postural dysfunction, decreased balance and impaired self-perceived functional ability    Education / Equipment: PD specific HEP, Info on obtaining a U-step walker    Patient agrees to discharge. Patient goals were partially met. Patient is being discharged due to being pleased with the current functional level.       PT End of Session - 12/31/21 0848     Visit Number 17    Number of Visits 25   Recert   Date for PT Re-Evaluation 81/02/54   Recert   Authorization Type Medicare & BCBS    PT Start Time 0848   Handoff w/OT   PT Stop Time 0915   DC   PT Time Calculation (min) 27 min    Equipment Utilized During Treatment Gait belt    Activity Tolerance Patient tolerated treatment well    Behavior During Therapy WFL for tasks assessed/performed;Impulsive                      Past Medical History:  Diagnosis Date   BPH (benign prostatic hyperplasia)    COVID 04/24/2020   scratchy throat x 1 day all symptoms resolved   Parkinson disease ([place])    Skin cancer of face    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   Sleep apnea    mild no cpap used   Urethral stricture    Past Surgical History:  Procedure Laterality Date   BURR HOLE W/ STEREOTACTIC INSERTION OF DBS LEADS / INTRAOP MICROELECTRODE RECORDING  04/2014   [facility], new leads replaced in 2020   COLONOSCOPY  2019   [place] N/A 06/15/2019   Procedure: CYSTOSCOPY WITH URETHRAL DILATATION;  Surgeon: Franchot Gallo, MD;  Location: [facility];  Service: Urology;  Laterality: N/A;  [place] N/A 05/09/2020   Procedure: CYSTOSCOPY WITH BALLOON DILATATION OF POSTERIOR URETHRAL STRICTURE, FLUOROSCOPIC INTERPRETATION;  Surgeon: Franchot Gallo, MD;  Location: [facility];  Service: Urology;  Laterality: N/A;   KNEE SURGERY  yrs ago   exploratory/left   SKIN CANCER EXCISION  01/2014   face   TRANSURETHRAL RESECTION OF PROSTATE     2008   Patient Active Problem List   Diagnosis Date Noted   Rib pain on right side 12/10/2020   Hyperlipidemia 10/11/2020   Bilateral olecranon bursitis 07/05/2019   Other bursitis of knee, left knee 12/06/2018   Pain in right leg 12/06/2018   Parkinson disease ([place])     ONSET DATE: 10/02/2021 (date of referral)  REFERRING DIAG: G20 (ICD-10-CM) - Parkinson's disease ([place])  THERAPY DIAG:  Unsteadiness on feet  Other abnormalities of gait and mobility  Abnormal posture  Rationale for Evaluation and Treatment Rehabilitation  SUBJECTIVE:  SUBJECTIVE STATEMENT: Pt reports he is doing well, "48 hours is not enough time to recover from sessions and be expected to do exercises". Pt in agreement to DC from PT today and schedule PD evals in 60mo.   Pt accompanied by: self  PERTINENT HISTORY: PD 2008, bilateral DBS (2018); hyperlipidemia   Pt with facial dystonias/dyskinesias with his medications that make it difficult to communicate and keep his eyes open (happens approx. 45 minutes after taking meds)  PAIN:  Are you having pain? Yes: NPRS scale: 1/10 Pain location: L shoulder Pain description: Achy Aggravating factors: Lifting it up    PRECAUTIONS: Fall Bilateral DBS, No driving, freezing/festination episodes, no lifting >5#  FALLS: Has  patient fallen in last 6 months? Yes. Number of falls Yes. Number of falls 2 with ED visits and stumbles/multiple other falls  (1-2x/month)   PLOF: Independent with basic ADLs, Independent with household mobility without device, and Independent with community mobility with device Independent with basic ADLs, Vocation/Vocational requirements: retired in March 2023, and Leisure: [employer] 2x/wk, stretching/yoga (and therapy exercises), walk (range is limited approx 2 blocks--uses hiking poles)  PATIENT GOALS Wants to work on slowing down.   OBJECTIVE:  TODAY'S TREATMENT:  Therapeutic Activity   Gait pattern: step through pattern, decreased stride length, shuffling, lateral lean- Right, trunk flexed, narrow BOS, poor foot clearance- Right, and poor foot clearance- Left Distance walked: 115' loop completed 10x + 105' = 1255' Assistive device utilized:  U-step walker Level of assistance: Modified independence Comments: Pt demonstrated improved AD management compared to previous assessment. Noted occasional shuffling that pt able to self correct via hearing his feet scuff on floor. Pt exhibits improved postural control w/use of U-step   Briefly discussed scheduling PD evals in 30mo and pt obtaining new referral for therapy prior to that if he has a decline in functional mobility/balance. Pt verbalized understanding.  Reiterated importance of reducing multi-tasking, proper body mechanics and using AD at all times to reduce fall risk. Pt verbalized understanding. Pt did not wish to review HEP at this time/  Encouraged pt to stay active at home and consider joining the [facility] to use their seated bikes, similar to the one in clinic. Pt verbalized understanding.   Gait  Gait pattern: step through pattern, decreased step length- Right, decreased stride length, decreased hip/knee flexion- Right, decreased ankle dorsiflexion- Right, lateral lean- Right, trunk flexed, narrow BOS, and poor foot clearance-  Right Distance walked: Into and out of clinic as well as distances in clinic.  Assistive device utilized:no AD  Level of assistance: CGA Comments: Pt did not bring AD to clinic today, states he "does not need it in the morning". Reiterated importance of using an AD at all times, but pt not in agreement.   PATIENT EDUCATION: Education details: see above Person educated: Patient Education method: Explanation Education comprehension: verbalized understanding and needs further education   HOME EXERCISE PROGRAM: From previous bout of therapy: Seated and standing PWR Up, Quadruped PWR moves on floor, [email or web address]  Access Code: [email or web address] URL: [email or web address] Date: 10/27/2021 Prepared by: Alethia Berthold Jackquelyn Sundberg  Exercises - Standing on one leg in corner   - 1 x daily - 7 x weekly - 1 sets - 3 reps - 30-45 second hold - Sit to stand with band pull-apart   - 1 x daily - 7 x weekly - 3 sets - 10 reps    GOALS: Goals reviewed with patient? Yes  NEW LONG TERM GOALS FOR UPDATED POC:  Target date: 01/12/2022  Pt will be independent with final HEP for improved strength, balance, transfers, and gait.   Baseline:  Goal status: MET  2.  Pt will ambulate greater than or equal to 1500' feet on 6MWT with U-step walker mod I for improved cardiovascular endurance and BLE strength.   Baseline: 1260' w/U-step and S*; 1255' w/U-step mod I Goal status: PARTIALLY MET   ASSESSMENT:  CLINICAL IMPRESSION: Emphasis of skilled PT session on LTG assessment and DC from PT. Pt has met 1/2 and partially met 1/2 LTGs, demonstrating proper management of U-step walker during 6MWT and performing his HEP independently. Pt did not walk further on 6MWT but since he demonstrated mod I ability to use AD, goal was partially met. Pt continues to exhibit impulsive behavior and refusal to use an AD. Reiterated importance of minimizing multi-tasking w/gait and using an AD at all times for reduced fall risk, pt  verbalized understanding but not compliance. Pt in agreement to DC from PT today due to being satisfied with current functional level.    PERSONAL FACTORS Behavior pattern, Past/current experiences, Time since onset of injury/illness/exacerbation, and 3+ comorbidities: PD 2008, bilateral DBS (2018); hyperlipidemia, dystonia/dyskinesias  are also affecting patient's functional outcome.    PLAN: PT FREQUENCY: 2x/week  PT DURATION: 12 weeks + 4 weeks (recert)  PLANNED INTERVENTIONS: Therapeutic exercises, Therapeutic activity, Neuromuscular re-education, Balance training, Gait training, Patient/Family education, Self Care, Stair training, and DME instructions   Cruzita Lederer Kandra Graven, PT, DPT 12/31/2021, 9:19 AM

## 2022-01-05 ENCOUNTER — Encounter: Payer: Medicare Other | Admitting: Occupational Therapy

## 2022-01-05 ENCOUNTER — Ambulatory Visit: Payer: Medicare Other | Admitting: Physical Therapy

## 2022-01-07 ENCOUNTER — Ambulatory Visit: Payer: Medicare Other | Admitting: Physical Therapy

## 2022-01-12 ENCOUNTER — Ambulatory Visit: Payer: Medicare Other | Admitting: Physical Therapy

## 2022-01-13 DIAGNOSIS — Z23 Encounter for immunization: Secondary | ICD-10-CM | POA: Diagnosis not present

## 2022-01-21 DIAGNOSIS — M8580 Other specified disorders of bone density and structure, unspecified site: Secondary | ICD-10-CM | POA: Diagnosis not present

## 2022-01-21 DIAGNOSIS — G20A1 Parkinson's disease without dyskinesia, without mention of fluctuations: Secondary | ICD-10-CM | POA: Diagnosis not present

## 2022-01-21 DIAGNOSIS — I83029 Varicose veins of left lower extremity with ulcer of unspecified site: Secondary | ICD-10-CM | POA: Diagnosis not present

## 2022-01-21 DIAGNOSIS — I2510 Atherosclerotic heart disease of native coronary artery without angina pectoris: Secondary | ICD-10-CM | POA: Diagnosis not present

## 2022-01-21 DIAGNOSIS — R7989 Other specified abnormal findings of blood chemistry: Secondary | ICD-10-CM | POA: Diagnosis not present

## 2022-01-21 DIAGNOSIS — R5383 Other fatigue: Secondary | ICD-10-CM | POA: Diagnosis not present

## 2022-01-21 DIAGNOSIS — E559 Vitamin D deficiency, unspecified: Secondary | ICD-10-CM | POA: Diagnosis not present

## 2022-01-21 DIAGNOSIS — R269 Unspecified abnormalities of gait and mobility: Secondary | ICD-10-CM | POA: Diagnosis not present

## 2022-01-21 DIAGNOSIS — C801 Malignant (primary) neoplasm, unspecified: Secondary | ICD-10-CM | POA: Diagnosis not present

## 2022-01-21 DIAGNOSIS — E785 Hyperlipidemia, unspecified: Secondary | ICD-10-CM | POA: Diagnosis not present

## 2022-01-27 ENCOUNTER — Ambulatory Visit (INDEPENDENT_AMBULATORY_CARE_PROVIDER_SITE_OTHER): Payer: Medicare Other | Admitting: Neurology

## 2022-01-27 ENCOUNTER — Encounter: Payer: Self-pay | Admitting: Neurology

## 2022-01-27 VITALS — BP 107/66 | HR 62 | Ht 71.0 in | Wt 157.0 lb

## 2022-01-27 DIAGNOSIS — G249 Dystonia, unspecified: Secondary | ICD-10-CM

## 2022-01-27 DIAGNOSIS — G20B2 Parkinson's disease with dyskinesia, with fluctuations: Secondary | ICD-10-CM

## 2022-01-27 DIAGNOSIS — Z9689 Presence of other specified functional implants: Secondary | ICD-10-CM

## 2022-01-27 DIAGNOSIS — G4752 REM sleep behavior disorder: Secondary | ICD-10-CM

## 2022-01-27 DIAGNOSIS — R296 Repeated falls: Secondary | ICD-10-CM | POA: Diagnosis not present

## 2022-01-27 MED ORDER — CLONAZEPAM 0.5 MG PO TABS
0.5000 mg | ORAL_TABLET | Freq: Every day | ORAL | 1 refills | Status: DC
Start: 2022-01-27 — End: 2022-08-11

## 2022-01-27 NOTE — Progress Notes (Signed)
Subjective:    Patient ID: Ronald Noble is a 76 y.o. male.  HPI    Interim history:   Ronald Noble is a 76 year old right-handed gentleman with an underlying medical history of skin cancer including melanoma, enlarged prostate (s/p TURP), who presents for followup consultation of his R sided predominant Parkinson's disease, complicated by blepharospasm, sialorrhea (receives Xeomin inj. At [facility]), RBD and sleep disturbance as well as mild dyskinesias, with s/p bilateral DBS placements at [facility], s/p L IPG replacement on the L in 09/2018 and L IPG replacement under Dr. Salomon Fick at [facility] on 02/12/2021. He is accompanied by his  wife again today. I last saw him on 07/16/2021, at which time he was having ongoing issues with facial dyskinesias.  He had sustained additional falls unfortunately.  He was supposed to stop walker.    He had an appointment for botulinum toxin injection for sialorrhea in May 2023 with [name], he had a follow-up appointment with Elbert Ewings at [facility] on 09/22/2021.  He had a follow-up with [name] for his blepharospasm in August 2023.    Today, 01/27/2022: He reports still having significant facial dyskinesias about 45 minutes after he takes the levodopa dose.  Not every dose results in significant dyskinesias but most of the time he has this response, other than that, medication is effective, no recent programming changes were suggested at [facility].  He presented to the 12/08/2021 after a fall and he sustained a laceration to the lip.  This was repaired with glue. I reviewed the emergency room records.  He had a CT head without contrast, CT cervical spine without contrast and CT maxillofacial without contrast on 12/08/2021 and I reviewed the results: IMPRESSION: No evidence of acute intracranial injury or acute maxillofacial or cervical spine fracture.  He had a leg laceration at a shoe store when he caught his leg at a displaced and, this happened  in June and he required stitches, I reviewed the emergency room records from 09/27/2021.  He had a prolonged healing, needed to go to wound care for several weeks.  Needed regular dressing changes.  Since his retirement in April 2023, he is trying to stay busy, he has not received his U step walker yet, has not actually pursued the order.  His wife voices frustration regarding this delay, she also feels that sometimes he overdoes his physical activity and has a tendency to injure himself, he avoids climbing on ladders but likes to work in the yard, he just had a small area of skin injury right where he had previously had a laceration on the left leg.  He continues to take levodopa 1-1/2 pills 5 times a day.  Since his retirement he had to bring a lot of his paperwork home for storage, he is in the process of organizing all of it.  The patient's allergies, current medications, family history, past medical history, past social history, past surgical history and problem list were reviewed and updated as appropriate.    Previously (copied from previous notes for reference):      I saw him on 03/17/2021, at which time he reported difficulty controlling his movements and difficulty stopping when he started walking.  He had facial dyskinesias.  He continued to have Xeomin injections for blepharospasm with [name] at [facility].  He had developed eyelid swelling.  He was advised to make an appointment with his eye specialist.  He had an interim appointment with Dr. Melissa Noon  in December 2022 for blepharitis.  He had an emergency room visit in late December 2022 after a fall.  I reviewed the emergency room records.  He had a follow-up appointment with Elbert Ewings, PA at [facility] on 04/11/2021, at which time he was advised to increase Sinemet to 1-1/2 tablets every 3 hours, for a total of 5 times a day.  No changes in his stimulations were done at the time.  He had festination and was advised to start  using a U step walker rather than a cane.  He had an appointment with [name] on 05/07/2021, at which time he was advised to continue with Sinemet, Requip XL 6 mg daily and amantadine 100 mg twice daily.  His MoCA was 26 out of 30 and the addition of Aricept was discussed.  He had Xeomin injection on 05/07/2021 for blepharospasm.  He presented to the emergency room on 06/04/2021 after a fall.  He sustained an abrasion to the left temple and eyelid area.  I reviewed the emergency room records.       I saw him on 11/26/2020, at which time he reported increase in facial dyskinesias.  He had concerns about his balance and difficulty controlling his walking speed.  He had not made much in the way of changes to his medication regimen we had discussed at our last appointment.  He was advised to reduce his long-acting ropinirole to 4 mg daily.  He was advised to take Sinemet 25-100 mg strength 1 pill 7 times a day at 2-hour intervals.  He was advised to continue with amantadine 100 mg twice daily, clonazepam 0.5 mg at bedtime and melatonin at night.  He called in the interim in September 2022 reporting that the reduced ropinirole was not helping especially with his restless leg symptoms.  He wanted to get on the ropinirole long-acting 6 mg strength.  I provided an interim prescription for the 2 mg strength so he could use up his 4 mg pills.     I saw him on 05/28/2020, at which time he reported that he was taking levodopa 1-1/2 pills 5 times a day but he would also add half pills at times with half a pill of levodopa every 1/2 hour in the first part of the day.  He was advised to continue with Sinemet 1 pill every 2 hours for total of 7 doses.  He was advised to continue with amantadine 100 mg twice daily, clonazepam at night, ropinirole long-acting and melatonin in the evening as well.  They had a recent death in the family, it was unexpected.   He had an interim appointment with [name] in June 2022.    I saw  him on 11/23/2019, at which time he felt fairly stable.  He had one fall as he slipped on wet floor carrying groceries but thankfully did not have any injuries.  He voiced concern about his sister who was having trouble with her Parkinson's disease and we discussed this stressor at the time.  He had discontinued Botox injections at [facility] for his blepharospasm.  He was advised to increase his clonazepam to 0.5 mg at bedtime due to increase in dream enactment behavior.   He had an interim appointment with Elbert Ewings on 03/06/2020.  She recently requested to resume Botox injections at [facility].   He had an interim appointment with Dr. Deboraha Sprang at [facility] on 09/20/19, next appointment in September and an appointment with Elbert Ewings in  October.    I saw him on 05/03/2019, at which time he reported a problem with his lower back and right knee.  He had to use a cane for a while after a fall in the summer.  He had physical therapy and also a steroid injection in November 2020.  He was overall stable.  He was advised to follow-up routinely in 6 months and continue with his medications.   He emailed in the interim with concerns regarding his sisters Parkinson's disease and associated delusions and hallucinations.     I saw him on 11/01/2018, at which time he reported more problems with his balance and difficulty with freezing and also difficulty stopping when needed.  He had interim left IPG replacement on 09/23/2018.  He has been receiving Botox injections for his blepharospasm.  He did report a fall and bumped his right hip.  He had an x-ray which was negative for any bony injuries.  He was advised to monitor his hip pain.  He was advised to continue with his Parkinson's medications.    I saw him on 04/28/2018, at which time he reported doing well, had no falls, weight has been stable.  I suggested he keep his medications the same.       I saw him on 10/26/2017, at which time he reported doing  okay, tried Gocovri for one month, but it was cost prohibitive. He was back on amantadine 100 mg bid. He tried the reduced dose of amantadine but did not do well on the lower dose. He reported no recent falls. Was getting Botox injections for blepharospasm through [name]. He was on Sinemet 1-1/2 pills 5 times a day, long-acting Requip once daily at 5 PM. I asked him to continue with his medication regimen.   I saw him on 04/28/2017, at which time he reported some daytime somnolence. He was supposed to get cataract surgeries and colonoscopy done. He had done well after Botox injections at [facility]. He was on amantadine twice daily and I suggested we reduce it to once daily and consider taking him off altogether. I suggested he take his long-acting Requip 6 mg once daily in the evening, instead of during the day. We kept his Sinemet at 1-1/2 pills 5 times a day.     I saw him on 10/29/2016, at which time he reported doing okay, he had no recent falls, his left eye ptosis improved after adjustment of his DBS. He did not end up having any Botox injections to his eyelid. He was on Sinemet 1-1/2 pills 5 times a day, 3 hourly intervals, starting approximately at 8 AM. He was on amantadine twice daily and Requip XL around 1 PM.    I saw him on 04/09/2016, at which time he was doing okay, he had improvement in his blood first specimen on the left in facial dyskinesias. He had been followed at [facility]. He was started on amantadine and was taking it twice daily. He was off of Stalevo completely. He felt that his abdominal cramping and facial dyskinesias improved after he came off of Stalevo altogether. He was in the process of being scheduled for Botox injections for his blepharospasm at [facility]. He was still on once daily long-acting Requip, when he tried to stop it completely he noticed a flareup of his RLS. He denied any recent falls. He was exercising regularly. He did have some constipation but  generally under control with some regularity in his bowel movements reported.  I saw him on 07/09/2015, at which time he reported no recent syncope. He had a tilt table test in January 2017 which showed abnormal findings but minimal symptoms reported. He saw Dr. Hartford Poli with vascular surgery for consultation on 05/28/2015. He had a 24-hour blood pressure monitor. He had a follow-up appointment pending with his vascular specialist. He also had an appointment pending with [name] in October 2017. He felt he pulled a muscle in his upper back on the right. He was on Stalevo about 4 times a day, sometimes 5 times a day depending on his day to day activities. He was on long-acting Requip 6 mg once daily. He was sleeping a little better. He had been advised to start using compression stockings but had not started yet. He had noticed worsening of droopy eyelid. He mailed in December 2017 Re: Blepharospasm and treatment for this. He had been seeing an ophthalmologist.   I saw him on 03/11/2015, at which time he reported a recent fall in September 2016. He had just come back from a road trip and after being in the car for about 2-1/2 hours he got out of the car and fell to the ground for his wife even realize what happened. He fell on pavement and chipped his tooth. They were with some friends who helped. He had to have a crown placed over his chipped tooth. His DBS settings were adjusted when he had his follow-up at [facility]. His orthostatic blood pressure values were stable. Since his settings were changed he had no other syncopal spell. He did have some medication changes as well. He was no longer on clonazepam. Stalevo was 100 mg strength 4-5 times a day. Requip long-acting was 6 mg once daily. She was on long-acting melatonin 6 mg each night. He was advised to drink 4 bottles of water. He was scheduled for tilt table test as well. His wife was concerned about him climbing on ladders. We talked about  gait safety and syncope risk. He was advised not to climb on any ladders or work at heights.    I saw him on 11/07/2014, at which time he reported doing reasonably well. He had nasal congestion after his fall and injury. Of note, unfortunately, he had a syncopal spell on 10/29/2014 and hit his nose as he fell and sustained a nasal fracture. I reviewed the emergency room records. He was referred to ENT. His laceration was repaired with Dermabond. He reported that he stood up and felt lightheaded and then passed out. He fell and hit his face. He had a CT head without contrast as well as maxillofacial CT without contrast on 10/29/2014 which showed: RIGHT and LEFT nasal bone fractures. Small displaced fracture fragment on the LEFT. No skull fracture or intracranial hemorrhage. Unremarkable appearing deep brain stimulator leads. He fell at work. He had taken Levitra at night and 2 days prior he was working hard in the yard. He is notoriously not drinking enough water he admits. He had taken his Stalevo in the morning and was due for his next dose and became lightheaded when he got up quickly from his desk. He had been sitting for about 45 minutes consistently at his desk. He did have a warning sign and kept walking instead of sitting down again. He had a follow-up appointment at [facility] on 11/02/2014 and his Requip was increased slightly again. He had re-programming of his DBS as well. He had reduced the Requip XL to 3 mg  twice daily but this was increased to 4 mg twice daily. His Stalevo was at 4-6 pills daily depending on his work day. He does get sleepy at night around 9:30 and often falls asleep while watching TV. He tried Melatonin long-acting but in combination with clonazepam at night it did not work well and in fact he had insomnia. He tried to reduce his clonazepam to 0.25 mg each night but increased it back to 0.5 mg each night. For his nose fracture he saw Dr. Redmond Baseman and was scheduled for surgery. I did  not change his medications. I asked him to drink more water and reminded him to change positions slowly.   I saw him on 08/07/2014, at which time he reported doing quite well after his DBS placement. His wife felt that he was doing great. He was eating well. He was not drinking enough water. He had trace edema in his distal lower extremities. He had 2 appointments for DBS programming. His Stalevo was reduced from 6 pills a day to 4 pills a day but then he increased it back to 5 pills a day. He was off generic Sinemet. He was on Azilect once daily and Requip long-acting 8 mg twice daily.    He's been back to work. He is currently on Azilect once daily, Requip XL 8 mg twice daily, Stalevo 100 mg strength one pill 5 times a day and he has stopped the carbidopa-levodopa. I reviewed his outside records from Dr. Salomon Fick at [facility] neurosurgery. Patient had bilateral STN DBS electrodes placed on 06/01/2014. He had pulse generators placed approximately 3 weeks after that, 06/13/14.   I saw him on 02/01/2014, at which time he reported difficulty staying asleep. He had not consistently tried melatonin. He had problems with urinary urgency and was advised to try Kegel exercises by his urologist. His PCP talked to an infectious diseases specialist and it was determined he did not actually have Lyme disease.    He did not show for an appointment on 01/30/2014 - he genuinely forgot!   I saw him on 12/19/2013, at which time he reported that his medication was not working as well. He reported more freezing episodes and more off time. Of note, he had been using a new protein milkshake that he was drinking first thing in the morning. He had lost some weight and in an effort to improve his weight loss he had started this new supplements. His primary care physician had referred him to a nutritionist. We also talked about DBS evaluation and he requested to see another specialist for DBS  candidacy. I referred him to [name] at [facility] I also advised him to discontinue using the new protein supplement as I felt it may be interfering with the absorption of his Stalevo. I also suggested a trial of Parcopa half a pill 3 times a day at 11, 2 PM and 7 PM and we kept the Stalevo the same. He was advised to use MiraLAX as needed for constipation.   I saw him on 07/31/13, at which time [name], including swimming, yoga, and weights. He has been on Stalevo 100 mg every 2 1/2 hours starting at 7:30 AM for 6 doses. He stopped clonazepam, as he felt some daytime sluggishness. He has been taking C/L 1/2 tid in between. He had no cognitive issues, but did have some "downtime" midday. I did not change his medications.  He had an appointment with [name] at [facility] in June, but there was a mix up with his appointment and he does not have an appointment until next year.   In the interim, he was diagnosed with Lyme d/s in July. He was tested and treated by his PCP has has been referred to an infectious d/s doctor. He brings in his latest lying test results from 12/05/2013 which I reviewed: It looks like his IgM antibody status is negative but his IgG antibody status has not quite turn positive as far as I understand the report. That means he is probably in the subacute or early chronic phase if I interpret this correctly. He requested a sooner than scheduled appointment, due to feeling more freezing and more off time. This starts in the late morning hours but can also go into the afternoon after lunch. Cognitively and mood wise he feels stable. He does not sleep well at night. He has not consistently tried melatonin I recommended.   I saw him on 01/30/2013, at which time I felt he was stable. We talked about DBS surgery. I did not make any medication changes. He is aware of the increase risk of melanoma associated with DAs and his dermatologist is  aware that he is taking Requip XL.   I first met him on 09/27/2012, which time I did not make any changes to his medication and did not order any new test as he was stable. He saw [name] at [facility] and discussed DBS in the past but was not deemed a cadidate for it at the time as he was doing well. He was told by [name] to stay on schedule with his medication. 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 Sleep has been an issue. He goes to sleep well, but wakes up after 45 minutes to 1 hour. He is seeing a urologist. He exercises regularly.   He previously followed with [name] and was last seen by him on 05/25/2012, at which time [name] felt he was stable, but because of off time at 2 hours he increased his carbidopa-levodopa by adding half of a 10-100 mg strength 3 times a day to his regimen. He recommended decreasing his Stalevo to 125 mg from 100 mg 6 times a day. He has been on Stalevo 125 mg 6 times a day, at 7:30, 10, 12:30, 3 PM, 5:30 PM and 8 PM. He is on clonazepam 0.5 mg at night, Requip long-acting 6 mg twice daily, rasagiline 1 mg once daily, carbidopa-levodopa 10-100 mg strength half a tablet 3 times a day, 7:30 to 10, then midday and later in the evening.   He was followed by [name] since 2008 with a history of Parkinson's disease. He has a history of increased tone and slowness as well as tremor with R sided predominance. He was initially placed on Requip XL and then rasagiline was added in October 2008. In February 2010 Stalevo was added. He had low vitamin D levels in 2008. ANA, ESR, ceruloplasmin were normal except for slightly low ceruloplasmin of 16. Urine copper test was negative. Methylmalonic acid was normal. Urine for heavy metals is normal as well. MRI brain with and without contrast on 01/07/2007 was normal. He had a sleep study in October 2008 showing PLMs and evidence of RBD. Repeat sleep study in September 2011 showed supine obstructive sleep apnea  and RBD. He had a CPAP titration study in  late September 2011 which was not very successful. He has been on clonazepam at night. He has not noticed any compulsive behaviors, memory loss, depression, swelling, or orthostatic dizziness. He recently had a skin biopsy showing atypical cells, concerning for melanoma.   He works full-time as an Forensic psychologist, Barista. He exercises regularly and does yoga.      His Past Medical History Is Significant For: Past Medical History:  Diagnosis Date   BPH (benign prostatic hyperplasia)    COVID 04/24/2020   scratchy throat x 1 day all symptoms resolved   Parkinson disease    Skin cancer of face    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   Sleep apnea    mild no cpap used   Urethral stricture     His Past Surgical History Is Significant For: Past Surgical History:  Procedure Laterality Date   BURR HOLE W/ STEREOTACTIC INSERTION OF DBS LEADS / INTRAOP MICROELECTRODE RECORDING  04/2014   [facility], new leads replaced in 2020   COLONOSCOPY  2019   [place] N/A 06/15/2019   Procedure: CYSTOSCOPY WITH URETHRAL DILATATION;  Surgeon: Franchot Gallo, MD;  Location: [facility];  Service: Urology;  Laterality: N/A;  [place] N/A 05/09/2020   Procedure: CYSTOSCOPY WITH BALLOON DILATATION OF POSTERIOR URETHRAL STRICTURE, FLUOROSCOPIC INTERPRETATION;  Surgeon: Franchot Gallo, MD;  Location: [facility];  Service: Urology;  Laterality: N/A;   KNEE SURGERY  yrs ago   exploratory/left   SKIN CANCER EXCISION  01/2014   face   TRANSURETHRAL RESECTION OF PROSTATE     2008    His Family History Is Significant For: Family History  Problem Relation Age of Onset   COPD Mother    Cancer Father        Bladder   Parkinson's disease Sister    Parkinson's disease Sister    Colon cancer Neg Hx    Esophageal cancer Neg Hx    Stomach cancer Neg Hx      His Social History Is Significant For: Social History   Socioeconomic History   Marital status: Married    Spouse name: Levada Dy   Number of children: 4   Years of education: 82   Highest education level: Not on file  Occupational History   Occupation: LAWYER    Employer: [employer]  Tobacco Use   Smoking status: Never   Smokeless tobacco: Never  Vaping Use   Vaping Use: Never used  Substance and Sexual Activity   Alcohol use: Yes    Alcohol/week: 1.0 - 3.0 standard drink of alcohol    Types: 1 - 3 Glasses of wine per week    Comment: occasional   Drug use: No   Sexual activity: Yes  Other Topics Concern   Not on file  Social History Narrative   Patient is right handed and resides with wife   1-2 cups of coffee a day    Social Determinants of Health   Financial Resource Strain: Not on file  Food Insecurity: Not on file  Transportation Needs: Not on file  Physical Activity: Not on file  Stress: Not on file  Social Connections: Not on file    His Allergies Are:  Allergies  Allergen Reactions   Rosuvastatin     myalgias  :   His Current Medications Are:  Outpatient Encounter Medications as of 01/27/2022  Medication Sig   acetaminophen (TYLENOL)  325 MG tablet Take 2 tablets (650 mg total) by mouth every 6 (six) hours as needed.   amantadine (SYMMETREL) 100 MG capsule Take 1 capsule (100 mg total) by mouth 2 (two) times daily.   carbidopa-levodopa (SINEMET IR) 25-100 MG tablet TAKE 1 AND 1/2 TABLETS BY MOUTH 5 TIMES DAILY.   cholecalciferol (VITAMIN D3) 25 MCG (1000 UNIT) tablet Take 2,000 Units by mouth daily.   clonazePAM (KLONOPIN) 1 MG tablet Take 0.5 mg by mouth at bedtime.   ibandronate (BONIVA) 150 MG tablet Take 150 mg by mouth every 30 (thirty) days. Take in the morning with a full glass of water, on an empty stomach, and do not take anything else by mouth or lie down for the next 30 min.   Melatonin 5 MG SUBL Place under the tongue.    Ropinirole HCl 6 MG TB24 Take 1 tablet (6 mg total) by mouth daily.   [DISCONTINUED] clonazePAM (KLONOPIN) 0.25 MG disintegrating tablet DISSOLVE 2 TABLETS(0.5 MG) ON THE TONGUE AT BEDTIME   atorvastatin (LIPITOR) 10 MG tablet Take 1 tablet (10 mg total) by mouth daily. (Patient not taking: Reported on 10/20/2021)   Coenzyme Q10 (COQ10) 200 MG CAPS Take 200 mg by mouth daily. (Patient not taking: Reported on 01/27/2022)   NEXLETOL 180 MG TABS Take 1 tablet by mouth daily. (Patient not taking: Reported on 12/16/2021)   sildenafil (REVATIO) 20 MG tablet Take 20 mg by mouth as needed. (Patient not taking: Reported on 01/27/2022)   No facility-administered encounter medications on file as of 01/27/2022.  :  Review of Systems:  Out of a complete 14 point review of systems, all are reviewed and negative with the exception of these symptoms as listed below:  Review of Systems  Neurological:        Patient is here with his wife for a 6 month follow-up. He states he has had a terrible time with facial dyskinesias. They start about 45 minutes to 1 hour after most of his doses. They last about 45 minutes to an hour and a half. He is having an episode right now. He has had a couple of falls. He has had two wounds since the last visit, one was a gash from a display at a shoe market and the other was a fall with a split lip.    Objective:  Neurological Exam  Physical Exam Physical Examination:   Vitals:   01/27/22 1332  BP: 107/66  Pulse: 62   General Examination: The patient is a very pleasant 76 y.o. male in no acute distress. He appears well-developed and well-nourished and well groomed.   HEENT: Normocephalic, atraumatic, pupils are equal, round and reactive to light, extraocular tracking is moderately impaired, has no significant blepharospasm or facial grimacing today but had facial dyskinesias upon triage with the nurse.  Neck with mild nuchal rigidity.  Airway examination stable, no obvious  sialorrhea.  His stimulator wires are tunneled on the left side. Small scar at upper lip.   Chest: is clear to auscultation without wheezing, rhonchi or crackles noted.    Heart: sounds are regular and normal without murmurs, rubs or gallops noted.    Abdomen: is soft, non-tender and non-distended.   Extremities: There is no obvious edema in the distal lower extremities bilaterally.  Healing scar left distal anterior leg.   Skin: is warm and dry with no trophic changes noted. Age-related changes are noted on the skin and scars from skin cancer removals.    Musculoskeletal:  exam reveals no obvious joint deformities. Upper body tilt to R seems worse.      Neurologically:  Mental status: The patient is awake and alert, paying good attention. He is able to provide his history but details are provided by his wife today.  His memory, attention, language and knowledge are appropriate. There is no aphasia, agnosia, apraxia or anomia. There is a mild degree of bradyphrenia. Speech is mild to moderately hypophonic with moderate dysarthria today. Mood is congruent and affect is normal.  Cranial nerves are as described above under HEENT exam. Motor exam: Normal bulk, and strength for age is noted. There are no obvious dyskinesias noted today.  No obvious resting tremor.  Tone is mild to moderately rigid with presence of cogwheeling in the right upper extremity. There is overall moderate bradykinesia. There is no drift or rebound.  Fine motor skills exam: Fine motor skills are moderately impaired, more noticeable on the right.    Cerebellar testing shows no dysmetria or intention tremor. There is no truncal or gait ataxia.  Sensory exam is intact to light touch throughout.  Gait, station and balance: He stands up from the seated position with mild difficulty and has to push himself up, requires no assistance. He is noted to lean to the right, posture is moderately stooped, but seems stable.  He walks with  decreased arm swing bilaterally. Mild dystonic posturing of the right arm. Balance is impaired and he turns with unsteadiness.  No walking aid.    Assessment and Plan:    In summary, Ronald Noble is a very pleasant 76 year old male with an underlying medical history of skin cancer including melanoma, enlarged prostate (s/p TURP), who presents for followup consultation of his R sided predominant Parkinson's disease, complicated by RBD and sleep disturbance as well as mild dyskinesias. He is s/p bilateral DBS placements at [facility], s/p L IPG replacement on the L in 09/2018 and in November 2022. He is On Sinemet, Requip long-acting and amantadine. He has had orthostatic hypotension, syncopal spells, falls with injuries, facial dyskinesias, blepharospasm, constipation and bladder hyperactivity. He had b/l DBS placements at [facility] on 06/01/14 and generator placement on 06/13/2014. Left-sided battery replacement/generator replacement in June 2020 and again on 02/12/21. 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 He is again advised to get his U step walker.  He retired in April 2023 and is no longer driving due to his blepharospasm and facial dyskinesias which affect his vision.  We talked about tapering off his amantadine at the last appointment in April 2023 but he is still on it.  He is advised to continue with current medication regimen, we talked about the importance of fall prevention, gait safety, using a walker, and I agree that he no longer should not drive.  He is advised to follow-up in 6 months, sooner if needed.  He has regular appointments at [facility] neurology as well.  I renewed his prescription for Klonopin at this time.  I answered all their questions today and the patient and his wife were in agreement. I spent 40 minutes in total  face-to-face time and in reviewing records during pre-charting, more than 50% of which was spent in counseling and coordination of care, reviewing test results, reviewing medications and treatment regimen and/or in discussing or reviewing the diagnosis of PD, the  prognosis and treatment options. Pertinent laboratory and imaging test results that were available during this visit with the patient were reviewed by me and considered in my medical decision making (see chart for details).

## 2022-01-28 ENCOUNTER — Ambulatory Visit: Payer: Medicare Other | Admitting: Neurology

## 2022-02-11 DIAGNOSIS — Z23 Encounter for immunization: Secondary | ICD-10-CM | POA: Diagnosis not present

## 2022-02-24 ENCOUNTER — Emergency Department (HOSPITAL_COMMUNITY): Payer: Medicare Other

## 2022-02-24 ENCOUNTER — Emergency Department (HOSPITAL_COMMUNITY)
Admission: EM | Admit: 2022-02-24 | Discharge: 2022-02-24 | Disposition: A | Discharge status: Home / Self Care | Payer: Medicare Other | Attending: Emergency Medicine | Admitting: Emergency Medicine

## 2022-02-24 ENCOUNTER — Other Ambulatory Visit: Payer: Self-pay

## 2022-02-24 ENCOUNTER — Other Ambulatory Visit (HOSPITAL_COMMUNITY): Payer: Self-pay | Admitting: Internal Medicine

## 2022-02-24 DIAGNOSIS — N3000 Acute cystitis without hematuria: Secondary | ICD-10-CM | POA: Insufficient documentation

## 2022-02-24 DIAGNOSIS — M8580 Other specified disorders of bone density and structure, unspecified site: Secondary | ICD-10-CM | POA: Diagnosis not present

## 2022-02-24 DIAGNOSIS — R112 Nausea with vomiting, unspecified: Secondary | ICD-10-CM | POA: Diagnosis not present

## 2022-02-24 DIAGNOSIS — G20A1 Parkinson's disease without dyskinesia, without mention of fluctuations: Secondary | ICD-10-CM | POA: Diagnosis not present

## 2022-02-24 DIAGNOSIS — R0602 Shortness of breath: Secondary | ICD-10-CM | POA: Diagnosis not present

## 2022-02-24 DIAGNOSIS — E785 Hyperlipidemia, unspecified: Secondary | ICD-10-CM | POA: Diagnosis not present

## 2022-02-24 DIAGNOSIS — R0789 Other chest pain: Secondary | ICD-10-CM | POA: Diagnosis not present

## 2022-02-24 DIAGNOSIS — R42 Dizziness and giddiness: Secondary | ICD-10-CM | POA: Diagnosis not present

## 2022-02-24 DIAGNOSIS — J9811 Atelectasis: Secondary | ICD-10-CM | POA: Diagnosis not present

## 2022-02-24 DIAGNOSIS — R079 Chest pain, unspecified: Secondary | ICD-10-CM

## 2022-02-24 DIAGNOSIS — R911 Solitary pulmonary nodule: Secondary | ICD-10-CM | POA: Diagnosis not present

## 2022-02-24 DIAGNOSIS — G4733 Obstructive sleep apnea (adult) (pediatric): Secondary | ICD-10-CM | POA: Diagnosis not present

## 2022-02-24 DIAGNOSIS — I2510 Atherosclerotic heart disease of native coronary artery without angina pectoris: Secondary | ICD-10-CM | POA: Diagnosis not present

## 2022-02-24 DIAGNOSIS — R531 Weakness: Secondary | ICD-10-CM | POA: Diagnosis not present

## 2022-02-24 LAB — URINALYSIS, ROUTINE W REFLEX MICROSCOPIC
Bilirubin Urine: NEGATIVE
Glucose, UA: NEGATIVE mg/dL
Hgb urine dipstick: NEGATIVE
Ketones, ur: NEGATIVE mg/dL
Leukocytes,Ua: NEGATIVE
Nitrite: POSITIVE — AB
Protein, ur: NEGATIVE mg/dL
Specific Gravity, Urine: 1.016 (ref 1.005–1.030)
pH: 6 (ref 5.0–8.0)

## 2022-02-24 LAB — CBC
HCT: 44.7 % (ref 39.0–52.0)
Hemoglobin: 13.8 g/dL (ref 13.0–17.0)
MCH: 26.9 pg (ref 26.0–34.0)
MCHC: 30.9 g/dL (ref 30.0–36.0)
MCV: 87.1 fL (ref 80.0–100.0)
Platelets: 141 10*3/uL — ABNORMAL LOW (ref 150–400)
RBC: 5.13 MIL/uL (ref 4.22–5.81)
RDW: 14.6 % (ref 11.5–15.5)
WBC: 6.4 10*3/uL (ref 4.0–10.5)
nRBC: 0 % (ref 0.0–0.2)

## 2022-02-24 LAB — BASIC METABOLIC PANEL
Anion gap: 13 (ref 5–15)
BUN: 24 mg/dL — ABNORMAL HIGH (ref 8–23)
CO2: 25 mmol/L (ref 22–32)
Calcium: 9 mg/dL (ref 8.9–10.3)
Chloride: 100 mmol/L (ref 98–111)
Creatinine, Ser: 1.03 mg/dL (ref 0.61–1.24)
GFR, Estimated: >60 mL/min (ref >60)
Glucose, Bld: 99 mg/dL (ref 70–99)
Potassium: 4.6 mmol/L (ref 3.5–5.1)
Sodium: 138 mmol/L (ref 135–145)

## 2022-02-24 LAB — TROPONIN I (HIGH SENSITIVITY): Troponin I (High Sensitivity): 4 ng/L (ref <18)

## 2022-02-24 MED ORDER — CEPHALEXIN 500 MG PO CAPS: 500.0000 mg | ORAL_CAPSULE | Freq: Four times a day (QID) | ORAL | 0 refills | Status: AC

## 2022-02-24 NOTE — ED Triage Notes (Signed)
Pt BIB [facility] from Drs office after reporting intermittent CP for 2 weeks, reproducible with palpation, worse with deep inspiration, coughing or leaning. NSR with EMS, VSS, NAD

## 2022-02-24 NOTE — Discharge Instructions (Addendum)
You were seen today for chest pain.  This pain seems musculoskeletal in nature.  Your work-up showed no signs of acute coronary syndrome.  Your imaging showed no signs of fracture or pneumonia.  Please take anti-inflammatories such as Aleve or ibuprofen for symptom relief.  Please avoid any activities that seem to aggravate the pain.  Your work-up was also concerning for a urinary tract infection.  I have sent a prescription of Keflex to your pharmacy.  Please complete the entire prescription.  Please follow-up as planned with urology next week.  If you develop any life-threatening symptoms please return to the emergency department

## 2022-02-24 NOTE — ED Provider Notes (Signed)
[facility] EMERGENCY DEPARTMENT Provider Note   CSN: [id number] Arrival date & time: 02/24/22  1535     History  Chief Complaint  Patient presents with   Chest Pain    Ronald Noble is a 76 y.o. male.  Patient presents to the hospital via EMS from a doctor's office after complaining of intermittent chest pain which is been ongoing for approximately 2 weeks.  The patient states that the pain is in the sternal, left sternal region.  He states that the pain is worse with coughing or lying flat.He was evaluated by primary care prior to coming to the emergency department and experienced severe pain when lying flat. There was one area just left of the sternum that when palpated resulted in severe pain as well. Patient's wife reports that the patient was tearful due to pain at the time. An EKG at the primary care office reportedly showed minor changes from previous readings. The patient denies shortness of breath, frequent coughing, respiratory symptoms, abdominal pain, nausea, vomiting, injury. The patient's wife states that the patient does perform yard work, and was recently seen raking leaves onto a tarp, and subsequently carrying the leaf collection over his shoulder.  Past medical history significant for Parkinson disease with a stimulator implanted in the left chest, hyperlipidemia  HPI     Home Medications Prior to Admission medications   Medication Sig Start Date End Date Taking? Authorizing Provider  cephALEXin (KEFLEX) 500 MG capsule Take 1 capsule (500 mg total) by mouth 4 (four) times daily. 02/24/22  Yes Dorothyann Peng, PA-C  acetaminophen (TYLENOL) 325 MG tablet Take 2 tablets (650 mg total) by mouth every 6 (six) hours as needed. 04/01/21   Jeanell Sparrow, DO  amantadine (SYMMETREL) 100 MG capsule Take 1 capsule (100 mg total) by mouth 2 (two) times daily. 04/10/21   Star Age, MD  atorvastatin (LIPITOR) 10 MG tablet Take 1 tablet (10 mg total) by  mouth daily. Patient not taking: Reported on 10/20/2021 10/10/20   Larey Dresser, MD  carbidopa-levodopa (SINEMET IR) 25-100 MG tablet TAKE 1 AND 1/2 TABLETS BY MOUTH 5 TIMES DAILY. 08/20/21   Star Age, MD  cholecalciferol (VITAMIN D3) 25 MCG (1000 UNIT) tablet Take 2,000 Units by mouth daily.    [provider]  clonazePAM (KLONOPIN) 0.5 MG tablet Take 1 tablet (0.5 mg total) by mouth at bedtime. 01/27/22   Star Age, MD  Coenzyme Q10 (COQ10) 200 MG CAPS Take 200 mg by mouth daily. Patient not taking: Reported on 01/27/2022 08/01/20   Larey Dresser, MD  ibandronate (BONIVA) 150 MG tablet Take 150 mg by mouth every 30 (thirty) days. Take in the morning with a full glass of water, on an empty stomach, and do not take anything else by mouth or lie down for the next 30 min.    [provider]  Melatonin 5 MG SUBL Place under the tongue.    [provider]  NEXLETOL 180 MG TABS Take 1 tablet by mouth daily. Patient not taking: Reported on 12/16/2021 07/05/21   [provider]  Ropinirole HCl 6 MG TB24 Take 1 tablet (6 mg total) by mouth daily. 03/17/21   Star Age, MD  sildenafil (REVATIO) 20 MG tablet Take 20 mg by mouth as needed. Patient not taking: Reported on 01/27/2022    [provider]      Allergies    Rosuvastatin    Review of Systems   Review of  Systems  Constitutional:  Negative for fever.  Respiratory:  Positive for cough (Intermittent). Negative for shortness of breath.   Cardiovascular:  Positive for chest pain. Negative for palpitations and leg swelling.  Gastrointestinal:  Negative for abdominal pain, nausea and vomiting.  Genitourinary:  Negative for dysuria.  Musculoskeletal:  Negative for arthralgias.  Neurological:  Negative for syncope and weakness.    Physical Exam Updated Vital Signs BP (!) 169/84   Pulse 73   Temp 97.9 F (36.6 C) (Oral)   Resp 15   SpO2 97%  Physical Exam Vitals and nursing note  reviewed.  Constitutional:      General: He is not in acute distress.    Appearance: He is well-developed.  HENT:     Head: Normocephalic and atraumatic.  Eyes:     Conjunctiva/sclera: Conjunctivae normal.  Cardiovascular:     Rate and Rhythm: Normal rate and regular rhythm.     Heart sounds: No murmur heard. Pulmonary:     Effort: Pulmonary effort is normal. No respiratory distress.     Breath sounds: Normal breath sounds.  Chest:     Chest wall: Tenderness (Tenderness to palpation of the left chest just lateral to the sternum, approximately third intercostal space) present.  Abdominal:     Palpations: Abdomen is soft.     Tenderness: There is no abdominal tenderness.  Musculoskeletal:        General: No swelling.     Cervical back: Neck supple.     Right lower leg: No edema.     Left lower leg: No edema.  Skin:    General: Skin is warm and dry.     Capillary Refill: Capillary refill takes less than 2 seconds.  Neurological:     Mental Status: He is alert.  Psychiatric:        Mood and Affect: Mood normal.     ED Results / Procedures / Treatments   Labs (all labs ordered are listed, but only abnormal results are displayed) Labs Reviewed  BASIC METABOLIC PANEL - Abnormal; Notable for the following components:      Result Value   BUN 24 (*)    All other components within normal limits  CBC - Abnormal; Notable for the following components:   Platelets 141 (*)    All other components within normal limits  URINALYSIS, ROUTINE W REFLEX MICROSCOPIC - Abnormal; Notable for the following components:   APPearance HAZY (*)    Nitrite POSITIVE (*)    Bacteria, UA MANY (*)    All other components within normal limits  TROPONIN I (HIGH SENSITIVITY)  TROPONIN I (HIGH SENSITIVITY)    EKG None  Radiology CT Chest Wo Contrast  Result Date: 02/24/2022 CLINICAL DATA:  Chest wall pain, nontraumatic, infection or inflammation suspected, xray done EXAM: CT CHEST WITHOUT  CONTRAST TECHNIQUE: Multidetector CT imaging of the chest was performed following the standard protocol without IV contrast. RADIATION DOSE REDUCTION: This exam was performed according to the departmental dose-optimization program which includes automated exposure control, adjustment of the mA and/or kV according to patient size and/or use of iterative reconstruction technique. COMPARISON:  Radiograph earlier today. FINDINGS: Cardiovascular: Mild atherosclerosis of the thoracic aorta. The heart is normal in size. There are coronary artery calcifications. No pericardial effusion. Mediastinum/Nodes: No mediastinal adenopathy. Limited hilar assessment on this unenhanced exam. Patulous esophagus. No suspicious thyroid nodule. Lungs/Pleura: Mild biapical pleuroparenchymal scarring. Mild hypoventilatory changes in the dependent lungs. No confluent consolidation or evidence of acute airspace  disease. Minor subsegmental atelectasis in the right lower lobe. Small intrapulmonary lymph nodes along the minor fissure. No further imaging follow-up is recommended. No pleural effusion. 2 mm right apical pulmonary nodule series 4, image 27. Upper Abdomen: Right hydronephrosis is stable dating back to remote 2013 CT. Large volume of stool in the included colon. The spleen is partially included, but is enlarged spanning 15.8 cm cranial caudal. Musculoskeletal: Mild T1 and T2 superior endplate compression deformities, appearing chronic. Slight midthoracic spondylosis with anterior spurring. There is a remote posterior right sixth rib fracture. No acute rib fractures or focal bone abnormalities. Left chest wall battery pack with leads coursing into the neck. No chest wall soft tissue abnormalities. IMPRESSION: 1. No acute findings or explanation for chest pain. 2. Mild T1 and T2 superior endplate compression deformities, appearing chronic. Remote posterior right sixth rib fracture. 3. Coronary artery calcifications. 4. Incidental  findings in the upper abdomen of splenomegaly and chronic (documented since 2013) right hydronephrosis. Aortic Atherosclerosis (ICD10-I70.0). Electronically Signed   By: Keith Rake M.D.   On: 02/24/2022 18:26   DG Chest Port 1 View  Result Date: 02/24/2022 CLINICAL DATA:  Chest pain. EXAM: PORTABLE CHEST 1 VIEW COMPARISON:  April 01, 2021. FINDINGS: The heart size and mediastinal contours are within normal limits. Both lungs are clear. The visualized skeletal structures are unremarkable. IMPRESSION: No active disease. Electronically Signed   By: Marijo Conception M.D.   On: 02/24/2022 16:45    Procedures Procedures    Medications Ordered in ED Medications - No data to display  ED Course/ Medical Decision Making/ A&P                           Medical Decision Making Amount and/or Complexity of Data Reviewed Labs: ordered. Radiology: ordered.   This patient presents to the ED for concern of chest pain, this involves an extensive number of treatment options, and is a complaint that carries with it a high risk of complications and morbidity.  The differential diagnosis includes ACS, musculoskeletal pain, PE, dissection, pneumonia, and others   Co morbidities that complicate the patient evaluation  History of Parkinson's disease, stimulator implanted in left chest   Additional history obtained:  Additional history obtained from patient's wife and EMS External records from outside source obtained and reviewed including primary care note from earlier today showing tenderness over the left costochondral area.  PCP felt that the pain was likely musculoskeletal in nature but felt that he needed emergency rheum eval with chest CT for evaluation of fracture or other cause of pain.   Lab Tests:  I Ordered, and personally interpreted labs.  The pertinent results include: Unremarkable BMP, CBC, initial troponin 4, urine with positive nitrites and many bacteria   Imaging Studies  ordered:  I ordered imaging studies including chest x-ray and CT chest I independently visualized and interpreted imaging which showed  1. No acute findings or explanation for chest pain.  2. Mild T1 and T2 superior endplate compression deformities,  appearing chronic. Remote posterior right sixth rib fracture.  3. Coronary artery calcifications.  4. Incidental findings in the upper abdomen of splenomegaly and  chronic (documented since 2013) right hydronephrosis.    Aortic Atherosclerosis   I agree with the radiologist interpretation   Cardiac Monitoring: / EKG:  The patient was maintained on a cardiac monitor.  I personally viewed and interpreted the cardiac monitored which showed an underlying rhythm of: Sinus  rhythm   Test / Admission - Considered:  No signs of ACS on EKG.  Initial troponin of 4.  Very low clinical suspicion of ACS.  No shortness of breath to suggest PE.  No clinical signs of dissection.  No pneumonia on chest CT or x-ray.  Patient has reproducible tenderness to palpation and also has a reproducible aggravating factor which is moving from a sitting to lying position and lying to sitting position.  This seems most likely musculoskeletal in nature.  No fracture noted on imaging.  Plan to discharge patient home with recommendation for anti-inflammatories.  Incidentally the patient was noted to have positive nitrites and many bacteria in his urine.  Plan to discharge patient home with prescription for Keflex for acute simple cystitis.  Patient to follow-up as needed with his primary care provider        Final Clinical Impression(s) / ED Diagnoses Final diagnoses:  Chest pain, unspecified type  Acute cystitis without hematuria    Rx / DC Orders ED Discharge Orders          Ordered    cephALEXin (KEFLEX) 500 MG capsule  4 times daily        02/24/22 1908              Ronny Bacon 02/24/22 Brendia Sacks, MD 02/24/22 2354

## 2022-02-24 NOTE — ED Notes (Signed)
Patient transported to CT 

## 2022-03-04 DIAGNOSIS — Z9682 Presence of neurostimulator: Secondary | ICD-10-CM | POA: Diagnosis not present

## 2022-03-04 DIAGNOSIS — Z79899 Other long term (current) drug therapy: Secondary | ICD-10-CM | POA: Diagnosis not present

## 2022-03-04 DIAGNOSIS — G20A1 Parkinson's disease without dyskinesia, without mention of fluctuations: Secondary | ICD-10-CM | POA: Diagnosis not present

## 2022-03-04 DIAGNOSIS — K117 Disturbances of salivary secretion: Secondary | ICD-10-CM | POA: Diagnosis not present

## 2022-03-04 DIAGNOSIS — G245 Blepharospasm: Secondary | ICD-10-CM | POA: Diagnosis not present

## 2022-04-01 ENCOUNTER — Ambulatory Visit (INDEPENDENT_AMBULATORY_CARE_PROVIDER_SITE_OTHER): Payer: Medicare Other | Admitting: Orthopaedic Surgery

## 2022-04-01 ENCOUNTER — Encounter: Payer: Self-pay | Admitting: Orthopaedic Surgery

## 2022-04-01 DIAGNOSIS — M47812 Spondylosis without myelopathy or radiculopathy, cervical region: Secondary | ICD-10-CM | POA: Diagnosis not present

## 2022-04-01 DIAGNOSIS — M542 Cervicalgia: Secondary | ICD-10-CM | POA: Diagnosis not present

## 2022-04-01 NOTE — Progress Notes (Signed)
Office Visit Note   Patient: Ronald Noble           Date of Birth: [birth year]           MRN: [id number] Visit Date: 04/01/2022              Requested by: [name], MD [address] [place],   [id number] PCP: [name], MD   Assessment & Plan: Visit Diagnoses:  1. Neck pain   2. Spondylosis of cervical region without myelopathy or radiculopathy     Plan: Ronald Noble relates at least a several month history of neck pain with a sound as if "bones are crunching.  He has developed limited range of motion of the cervical spine but denies any referred pain to either upper extremity.  He does have a history of Parkinson's and has had a deep brain stimulator inserted with wires coursing along the posterior aspect of his left ear.  This was performed several years ago but did not see area which she is presently having his pain.  X-rays were not performed today but he did have a CT scan of his cervical spine in September that demonstrated cervical disc degeneration most severe at C5-6 and C6-7 with severe multilevel facet arthrosis.  I believe that is the real cause of his pain.  Have suggested a course of physical therapy and would like to check him back in 3 to 4 weeks if he still having an issue.  I would refer him to Dr. Laurance Flatten.  Long discussion with him regarding the above and what he may expect over time      Follow-Up Instructions: Return if symptoms worsen or fail to improve.   Orders:  Orders Placed This Encounter  Procedures   Ambulatory referral to Physical Therapy   No orders of the defined types were placed in this encounter.     Procedures: No procedures performed   Clinical Data: No additional findings.   Subjective: Chief Complaint  Patient presents with   Neck - Pain  Patient presents today for neck pain. He said that he has been experiencing left sided neck pain for months. No known injury. He said that "it sounds like bones crunching". He takes  over the counter pain medicine if needed. No pain in either upper extremity.   HPI  Review of Systems   Objective: Vital Signs: There were no vitals taken for this visit.  Physical Exam Constitutional:      Appearance: He is well-developed.  Pulmonary:     Effort: Pulmonary effort is normal.  Skin:    General: Skin is warm and dry.  Neurological:     Mental Status: He is alert and oriented to person, place, and time.  Psychiatric:        Behavior: Behavior normal.     Ortho Exam awake alert and oriented x 3.  Comfortable sitting.  Limited range of motion of cervical spine.  Barely able to touch his chin to his chest and only had about 20 to 30 degrees of normal neck extension when he had pain along the left side of his neck.  No masses.  I can feel the tract of the wire for the deep brain stimulation and that did not appear to be causing him any pain behind the left ear.  He did not have any masses.  But there was some soreness along the cervical spine posteriorly.  No shoulder pain.  With his neck in the neutral position  there were no localized areas of tenderness  Specialty Comments:  No specialty comments available.  Imaging: No results found.   PMFS History: Patient Active Problem List   Diagnosis Date Noted   Osteoarthritis cervical spine 04/01/2022   Rib pain on right side 12/10/2020   Hyperlipidemia 10/11/2020   Bilateral olecranon bursitis 07/05/2019   Other bursitis of knee, left knee 12/06/2018   Pain in right leg 12/06/2018   Parkinson disease    Past Medical History:  Diagnosis Date   BPH (benign prostatic hyperplasia)    COVID 04/24/2020   scratchy throat x 1 day all symptoms resolved   Parkinson disease    Skin cancer of face    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   Sleep apnea    mild no cpap used   Urethral stricture     Family History  Problem Relation Age of Onset   COPD Mother    Cancer Father        Bladder   Parkinson's  disease Sister    Parkinson's disease Sister    Colon cancer Neg Hx    Esophageal cancer Neg Hx    Stomach cancer Neg Hx     Past Surgical History:  Procedure Laterality Date   BURR HOLE W/ STEREOTACTIC INSERTION OF DBS LEADS / INTRAOP MICROELECTRODE RECORDING  04/2014   [facility], new leads replaced in 2020   COLONOSCOPY  2019   [place] N/A 06/15/2019   Procedure: CYSTOSCOPY WITH URETHRAL DILATATION;  Surgeon: Franchot Gallo, MD;  Location: [place];  Service: Urology;  Laterality: N/A;  [place] N/A 05/09/2020   Procedure: CYSTOSCOPY WITH BALLOON DILATATION OF POSTERIOR URETHRAL STRICTURE, FLUOROSCOPIC INTERPRETATION;  Surgeon: Franchot Gallo, MD;  Location: [facility];  Service: Urology;  Laterality: N/A;   KNEE SURGERY  yrs ago   exploratory/left   SKIN CANCER EXCISION  01/2014   face   TRANSURETHRAL RESECTION OF PROSTATE     2008   Social History   Occupational History   Occupation: LAWYER    Employer: [employer]  Tobacco Use   Smoking status: Never   Smokeless tobacco: Never  Vaping Use   Vaping Use: Never used  Substance and Sexual Activity   Alcohol use: Yes    Alcohol/week: 1.0 - 3.0 standard drink of alcohol    Types: 1 - 3 Glasses of wine per week    Comment: occasional   Drug use: No   Sexual activity: Yes

## 2022-04-09 ENCOUNTER — Encounter (HOSPITAL_COMMUNITY): Payer: Medicare Other

## 2022-04-15 ENCOUNTER — Encounter (HOSPITAL_COMMUNITY): Payer: Medicare Other

## 2022-04-16 ENCOUNTER — Other Ambulatory Visit (HOSPITAL_COMMUNITY): Payer: Self-pay | Admitting: *Deleted

## 2022-04-17 ENCOUNTER — Encounter (HOSPITAL_COMMUNITY)
Admission: RE | Admit: 2022-04-17 | Discharge: 2022-04-17 | Disposition: A | Discharge status: Home / Self Care | Payer: Medicare HMO | Source: Ambulatory Visit | Attending: Internal Medicine | Admitting: Internal Medicine

## 2022-04-17 DIAGNOSIS — I2510 Atherosclerotic heart disease of native coronary artery without angina pectoris: Secondary | ICD-10-CM | POA: Insufficient documentation

## 2022-04-17 DIAGNOSIS — E785 Hyperlipidemia, unspecified: Secondary | ICD-10-CM | POA: Diagnosis present

## 2022-04-17 MED ORDER — INCLISIRAN SODIUM 284 MG/1.5ML ~~LOC~~ SOSY
284.0000 mg | PREFILLED_SYRINGE | Freq: Once | SUBCUTANEOUS | Status: AC
  Administered 2022-04-17: 284 mg via SUBCUTANEOUS

## 2022-04-17 MED ORDER — INCLISIRAN SODIUM 284 MG/1.5ML ~~LOC~~ SOSY
PREFILLED_SYRINGE | SUBCUTANEOUS | Status: AC
  Filled 2022-04-17: qty 1.5

## 2022-04-17 NOTE — Therapy (Incomplete)
OUTPATIENT PHYSICAL THERAPY CERVICAL EVALUATION   Patient Name: Ronald Noble MRN: [id number] DOB:1945-08-20, 77 y.o., male Today's Date: 04/17/2022  END OF SESSION:   Past Medical History:  Diagnosis Date   BPH (benign prostatic hyperplasia)    COVID 04/24/2020   scratchy throat x 1 day all symptoms resolved   Parkinson disease    Skin cancer of face    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   Sleep apnea    mild no cpap used   Urethral stricture    Past Surgical History:  Procedure Laterality Date   BURR HOLE W/ STEREOTACTIC INSERTION OF DBS LEADS / INTRAOP MICROELECTRODE RECORDING  04/2014   [facility], [facility] in 2020   COLONOSCOPY  2019   [place] N/A 06/15/2019   Procedure: CYSTOSCOPY WITH URETHRAL DILATATION;  Surgeon: Franchot Gallo, MD;  Location: [facility];  Service: Urology;  Laterality: N/A;  [place] N/A 05/09/2020   Procedure: CYSTOSCOPY WITH BALLOON DILATATION OF POSTERIOR URETHRAL STRICTURE, FLUOROSCOPIC INTERPRETATION;  Surgeon: Franchot Gallo, MD;  Location: [facility];  Service: Urology;  Laterality: N/A;   KNEE SURGERY  yrs ago   exploratory/left   SKIN CANCER EXCISION  01/2014   face   TRANSURETHRAL RESECTION OF PROSTATE     2008   Patient Active Problem List   Diagnosis Date Noted   Osteoarthritis cervical spine 04/01/2022   Rib pain on right side 12/10/2020   Hyperlipidemia 10/11/2020   Bilateral olecranon bursitis 07/05/2019   Other bursitis of knee, left knee 12/06/2018   Pain in right leg 12/06/2018   Parkinson disease     PCP: [name], MD  REFERRING PROVIDER: Garald Balding, MD  REFERRING DIAG: M54.2 (ICD-10-CM) - Neck pain  THERAPY DIAG:  No diagnosis found.  Rationale for Evaluation and Treatment: Rehabilitation  ONSET DATE: ***  SUBJECTIVE:                                                                                                                                                                                                          SUBJECTIVE STATEMENT: ***  PERTINENT HISTORY:  PD 2008, bilateral DBS (2018); hyperlipidemia  PAIN:  Are you having pain? Yes: NPRS scale: ***/10 Pain location: *** Pain description: *** Aggravating factors: *** Relieving factors: ***  PRECAUTIONS: Fall  WEIGHT BEARING RESTRICTIONS: No  FALLS:  Has patient fallen in last 6 months? {fallsyesno:27318}  LIVING ENVIRONMENT: Lives with: {OPRC lives with:25569::"lives with their family"} Lives  in: {Lives in:25570} Stairs: {opstairs:27293} Has following equipment at home: {Assistive devices:23999}  OCCUPATION: ***  PLOF: {PLOF:24004}  PATIENT GOALS: ***  NEXT MD VISIT: ***  OBJECTIVE:   DIAGNOSTIC FINDINGS:  CT: cervical disc degeneration most severe at C5-6 and C6-7 with severe multilevel facet arthrosis.  PATIENT SURVEYS:  04/20/22: FOTO ***  COGNITION: Overall cognitive status: {cognition:24006}  SENSATION: {sensation:27233}  POSTURE: {posture:25561}  PALPATION: 04/20/22: ***   CERVICAL ROM:   Active ROM A/PROM (deg) eval  Flexion   Extension   Right lateral flexion   Left lateral flexion   Right rotation   Left rotation    (Blank rows = not tested)  UPPER EXTREMITY ROM:  {AROM/PROM:27142} ROM Right eval Left eval  Shoulder flexion    Shoulder extension    Shoulder abduction    Shoulder adduction    Shoulder extension    Shoulder internal rotation    Shoulder external rotation    Elbow flexion    Elbow extension    Wrist flexion    Wrist extension    Wrist ulnar deviation    Wrist radial deviation    Wrist pronation    Wrist supination     (Blank rows = not tested)  UPPER EXTREMITY MMT:  MMT Right eval Left eval  Shoulder flexion    Shoulder extension    Shoulder abduction    Shoulder adduction    Shoulder extension    Shoulder  internal rotation    Shoulder external rotation    Middle trapezius    Lower trapezius    Elbow flexion    Elbow extension    Wrist flexion    Wrist extension    Wrist ulnar deviation    Wrist radial deviation    Wrist pronation    Wrist supination    Grip strength     (Blank rows = not tested)  CERVICAL SPECIAL TESTS:  04/20/22: {Cervical special tests:25246}  FUNCTIONAL TESTS:  04/20/22: {Functional tests:24029}  TODAY'S TREATMENT:                                                                                                                              DATE: 04/20/22 ***   PATIENT EDUCATION:  Education details: HEP Person educated: Patient Education method: Consulting civil engineer, Media planner, and Handouts Education comprehension: verbalized understanding, returned demonstration, and needs further education  HOME EXERCISE PROGRAM: ***  ASSESSMENT:  CLINICAL IMPRESSION: Patient is a 77 y.o. male who was seen today for physical therapy evaluation and treatment for neck pain. He demonstrates decreased ROM and strength as well as *** affecting functional mobility.  He will benefit from PT to address deficits listed.   OBJECTIVE IMPAIRMENTS: {opptimpairments:25111}.   ACTIVITY LIMITATIONS: {activitylimitations:27494}  PARTICIPATION LIMITATIONS: {participationrestrictions:25113}  PERSONAL FACTORS: {Personal factors:25162} are also affecting patient's functional outcome.   REHAB POTENTIAL: {rehabpotential:25112}  CLINICAL DECISION MAKING: {clinical decision making:25114}  EVALUATION COMPLEXITY: {Evaluation complexity:25115}   GOALS: Goals reviewed with patient? Yes  SHORT TERM GOALS: Target date: ***  Independent with initial HEP Goal status: INITIAL  2.  *** Goal status: {GOALSTATUS:25110}  3.  *** Goal status: {GOALSTATUS:25110}  4.  *** Goal status: {GOALSTATUS:25110}  5.  *** Goal status: {GOALSTATUS:25110}  6.  *** Goal status:  {GOALSTATUS:25110}  LONG TERM GOALS: Target date: ***  Independent with final HEP Goal status: INITIAL  2.  FOTO score improved to *** Goal status: INITIAL  3.  *** improved to *** for *** Goal status: INIITAL  4.  Report pain < ***/10 with *** for improved function Goal status: INITIAL  5.  *** Goal status: {GOALSTATUS:25110}  6.  *** Goal status: {GOALSTATUS:25110}     PLAN:  PT FREQUENCY: {rehab frequency:25116}  PT DURATION: {rehab duration:25117}  PLANNED INTERVENTIONS: {rehab planned interventions:25118::"Therapeutic exercises","Therapeutic activity","Neuromuscular re-education","Balance training","Gait training","Patient/Family education","Self Care","Joint mobilization"}  PLAN FOR NEXT SESSION: ***   Laureen Abrahams, PT, DPT 04/17/22 11:02 AM

## 2022-04-20 ENCOUNTER — Ambulatory Visit: Payer: Medicare Other | Admitting: Physical Therapy

## 2022-04-27 ENCOUNTER — Encounter: Payer: Self-pay | Admitting: Rehabilitative and Restorative Service Providers"

## 2022-04-27 ENCOUNTER — Other Ambulatory Visit: Payer: Self-pay

## 2022-04-27 ENCOUNTER — Ambulatory Visit (INDEPENDENT_AMBULATORY_CARE_PROVIDER_SITE_OTHER): Payer: Medicare HMO | Admitting: Rehabilitative and Restorative Service Providers"

## 2022-04-27 DIAGNOSIS — M542 Cervicalgia: Secondary | ICD-10-CM | POA: Diagnosis not present

## 2022-04-27 DIAGNOSIS — R293 Abnormal posture: Secondary | ICD-10-CM | POA: Diagnosis not present

## 2022-04-27 NOTE — Therapy (Signed)
OUTPATIENT PHYSICAL THERAPY EVALUATION   Patient Name: Emerson Schreifels MRN: [id number] DOB:13-Nov-1945, 77 y.o., male Today's Date: 04/27/2022  END OF SESSION:  PT End of Session - 04/27/22 0848     Visit Number 1    Number of Visits 20    Date for PT Re-Evaluation 07/06/22    PT Start Time 0848    PT Stop Time 0923    PT Time Calculation (min) 35 min    Activity Tolerance Patient tolerated treatment well    Behavior During Therapy [facility] for tasks assessed/performed             Past Medical History:  Diagnosis Date   BPH (benign prostatic hyperplasia)    COVID 04/24/2020   scratchy throat x 1 day all symptoms resolved   Parkinson disease    Skin cancer of face    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   Sleep apnea    mild no cpap used   Urethral stricture    Past Surgical History:  Procedure Laterality Date   BURR HOLE W/ STEREOTACTIC INSERTION OF DBS LEADS / INTRAOP MICROELECTRODE RECORDING  04/2014   [facility] in 2020   COLONOSCOPY  2019   CYSTOSCOPY WITH URETHRAL DILATATION N/A 06/15/2019   Procedure: CYSTOSCOPY WITH URETHRAL DILATATION;  Surgeon: Franchot Gallo, MD;  Location: [place];  Service: Urology;  Laterality: N/A;  [place] N/A 05/09/2020   Procedure: CYSTOSCOPY WITH BALLOON DILATATION OF POSTERIOR URETHRAL STRICTURE, FLUOROSCOPIC INTERPRETATION;  Surgeon: Franchot Gallo, MD;  Location: [facility];  Service: Urology;  Laterality: N/A;   KNEE SURGERY  yrs ago   exploratory/left   SKIN CANCER EXCISION  01/2014   face   TRANSURETHRAL RESECTION OF PROSTATE     2008   Patient Active Problem List   Diagnosis Date Noted   Osteoarthritis cervical spine 04/01/2022   Rib pain on right side 12/10/2020   Hyperlipidemia 10/11/2020   Bilateral olecranon bursitis 07/05/2019   Other bursitis of knee, left knee 12/06/2018   Pain in right leg 12/06/2018    Parkinson disease     PCP: [name] MD  REFERRING PROVIDER: Dr. Ileene Rubens (previously referred by Joni Fears)  REFERRING DIAG: M54.2 (ICD-10-CM) - Neck pain  THERAPY DIAG:  Cervicalgia  Abnormal posture  Rationale for Evaluation and Treatment: Rehabilitation  ONSET DATE: 10/2021 insidious onset  SUBJECTIVE:                           "[name]"  SUBJECTIVE STATEMENT: Pt reported insidious onset of neck pain.  He indicated pulling in neck , primarily when turning head to Lt.  Looking up is painful as well.  He reported spasms as at well at times.  He indicated pain is not constant.  He indicated turning head in bed can cause pain but able to get back to sleep.   PERTINENT HISTORY:  Parkinsons, deep brain stimulator (travels Lt side of neck), History of skin cancer  PAIN:  NPRS scale: at worst moderate to severe (6-7/10) Pain location: cervical Pain description: pulling, tightness, spasm, reaching with Lt arm Aggravating factors: Lt turning, looking up Relieving factors: no specific movements performed  PRECAUTIONS: None  WEIGHT BEARING RESTRICTIONS: No  FALLS:  Has patient fallen in last 6 months? Yes. Number of falls 1-2 with trip to ER around Christmas Eve He reported not in fear of falling.   LIVING ENVIRONMENT: Lives with: lives with their spouse Lives in: House/apartment Has following equipment at home: has cane, uses occasional.   OCCUPATION: Retired  PLOF: Independent, [place] handed, watch TV, sit at computer, light yard work   PATIENT GOALS: Help pain, turn head better  OBJECTIVE:   PATIENT SURVEYS:  04/27/2022 FOTO intake:  55  predicted:  56  COGNITION: 04/27/2022 Overall cognitive status: Within functional limits for tasks assessed  SENSATION: 04/27/2022 No specific  testing  POSTURE: 04/27/2022 rounded shoulders, forward head, and increased thoracic kyphosis, seated lean to Rt   PALPATION: 04/27/2022 Tenderness in lt upper trap, levator scap, Lt cervical paraspinals   CERVICAL ROM:   ROM AROM (deg) 04/27/2022  Flexion 40 c pain  Extension 25 c pain  Right lateral flexion   Left lateral flexion   Right rotation 46  Left rotation 50 c pain   (Blank rows = not tested)  UPPER EXTREMITY ROM:  ROM Right 04/27/2022 Left 04/27/2022  Shoulder flexion  Seated AROM limited to 100 deg c pain noted in Lt cervical  Shoulder extension    Shoulder abduction    Shoulder adduction    Shoulder extension    Shoulder internal rotation    Shoulder external rotation    Elbow flexion    Elbow extension    Wrist flexion    Wrist extension    Wrist ulnar deviation    Wrist radial deviation    Wrist pronation    Wrist supination     (Blank rows = not tested)  UPPER EXTREMITY MMT:  MMT Right 04/27/2022 Left 04/27/2022  Shoulder flexion 5/5 4/5 c pain shoulder pain  Shoulder extension    Shoulder abduction 5/5 4/5 c shoulder pain  Shoulder adduction    Shoulder extension    Shoulder internal rotation 5/5 5/5  Shoulder external rotation 5/5 4/5  Middle trapezius    Lower trapezius    Elbow flexion    Elbow extension    Wrist flexion    Wrist extension    Wrist ulnar deviation    Wrist radial deviation    Wrist pronation    Wrist supination    Grip strength     (Blank rows = not tested)  CERVICAL SPECIAL TESTS:  04/27/2022 No specific testing today  FUNCTIONAL TESTS:  04/27/2022 No specific testing today   TODAY'S TREATMENT:  DATE: 04/27/2022 Therex:    HEP instruction/performance c cues for techniques, handout provided.  Trial set performed of each for comprehension and symptom assessment.  See below for exercise list  PATIENT EDUCATION:   04/27/2022: Education details: HEP, POC Person educated: Patient Education method: Consulting civil engineer, Demonstration, Verbal cues, and Handouts Education comprehension: verbalized understanding, returned demonstration, and verbal cues required  HOME EXERCISE PROGRAM: Access Code: [email or web address] URL: [email or web address] Date: 04/27/2022 Prepared by: Scot Jun  Exercises - Seated Scapular Retraction  - 3-5 x daily - 7 x weekly - 1 sets - 10 reps - 3-5 hold - Seated Upper Trapezius Stretch (Mirrored)  - 2 x daily - 7 x weekly - 1 sets - 5 reps - 15 hold  ASSESSMENT:  CLINICAL IMPRESSION: Patient is a 77 y.o. who comes to clinic with complaints of cervical with mobility, strength and movement coordination deficits that impair their ability to perform usual daily and recreational functional activities without increase difficulty/symptoms at this time.  Patient to benefit from skilled PT services to address impairments and limitations to improve to previous level of function without restriction secondary to condition.    OBJECTIVE IMPAIRMENTS: Abnormal gait, decreased activity tolerance, decreased balance, decreased coordination, decreased endurance, decreased mobility, difficulty walking, decreased ROM, decreased strength, hypomobility, increased fascial restrictions, impaired perceived functional ability, increased muscle spasms, impaired flexibility, impaired UE functional use, improper body mechanics, postural dysfunction, and pain.   ACTIVITY LIMITATIONS: carrying, lifting, sitting, standing, sleeping, and reach over head  PARTICIPATION LIMITATIONS: meal prep, cleaning, interpersonal relationship, community activity, and yard work  PERSONAL FACTORS:  History of parkinson's c implant  are also affecting patient's functional outcome.   REHAB POTENTIAL: Good  CLINICAL DECISION MAKING: Stable/uncomplicated  EVALUATION COMPLEXITY: Low   GOALS: Goals reviewed with patient?  Yes  SHORT TERM GOALS: (target date for Short term goals are 3 weeks 05/18/2022)  1.Patient will demonstrate independent use of home exercise program to maintain progress from in clinic treatments. Goal status: New  LONG TERM GOALS: (target dates for all long term goals are 10 weeks  07/06/2022 )   1. Patient will demonstrate/report pain at worst less than or equal to 2/10 to facilitate minimal limitation in daily activity secondary to pain symptoms. Goal status: New   2. Patient will demonstrate independent use of home exercise program to facilitate ability to maintain/progress functional gains from skilled physical therapy services. Goal status: New   3. Patient will demonstrate FOTO outcome > or = 64 % to indicate reduced disability due to condition. Goal status: New   4.  Patient will demonstrate cervical AROM WFL s symptoms to facilitate usual head movements for daily activity including driving, self care.   Goal status: New   5.  Patient will demonstrate Lt shoulder MMT equal to Rt to facilitate usual lifting, reaching, carrying at PLOF.   Goal status: New   6.  Patient will demonstrate sleeping s restriction due to symptoms.  Goal status: New     PLAN:  PT FREQUENCY: 1-2x/week  PT DURATION: 10 weeks  PLANNED INTERVENTIONS: Therapeutic exercises, Therapeutic activity, Neuro Muscular re-education, Balance training, Gait training, Patient/Family education, Joint mobilization, Stair training, DME instructions, Dry Needling, Electrical stimulation, Cryotherapy, vasopneumatic device,Traction, Moist heat, Taping, Ultrasound, Ionotophoresis '4mg'$ /ml Dexamethasone, and Manual therapy.  All included unless contraindicated  PLAN FOR NEXT SESSION: Check HEP use/response.  Progressive mobility improvements, early shoulder strengthening.   Brain stimulator in place  Scot Jun, PT, DPT, OCS, ATC 04/27/22  9:42 AM

## 2022-05-05 ENCOUNTER — Ambulatory Visit (INDEPENDENT_AMBULATORY_CARE_PROVIDER_SITE_OTHER): Payer: Medicare HMO | Admitting: Rehabilitative and Restorative Service Providers"

## 2022-05-05 ENCOUNTER — Encounter: Payer: Self-pay | Admitting: Rehabilitative and Restorative Service Providers"

## 2022-05-05 DIAGNOSIS — M542 Cervicalgia: Secondary | ICD-10-CM

## 2022-05-05 DIAGNOSIS — R293 Abnormal posture: Secondary | ICD-10-CM

## 2022-05-05 NOTE — Therapy (Signed)
OUTPATIENT PHYSICAL THERAPY TREATMENT   Patient Name: Ronald Noble MRN: [id number] DOB:25-Mar-1946, 77 y.o., male Today's Date: 05/05/2022  END OF SESSION:  PT End of Session - 05/05/22 1135     Visit Number 2    Number of Visits 20    Date for PT Re-Evaluation 07/06/22    PT Start Time 1135    PT Stop Time 1214    PT Time Calculation (min) 39 min    Activity Tolerance Patient tolerated treatment well    Behavior During Therapy [facility] for tasks assessed/performed              Past Medical History:  Diagnosis Date   BPH (benign prostatic hyperplasia)    COVID 04/24/2020   scratchy throat x 1 day all symptoms resolved   Parkinson disease    Skin cancer of face    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   Sleep apnea    mild no cpap used   Urethral stricture    Past Surgical History:  Procedure Laterality Date   BURR HOLE W/ STEREOTACTIC INSERTION OF DBS LEADS / INTRAOP MICROELECTRODE RECORDING  04/2014   [facility], [facility] in 2020   COLONOSCOPY  2019   CYSTOSCOPY WITH URETHRAL DILATATION N/A 06/15/2019   Procedure: CYSTOSCOPY WITH URETHRAL DILATATION;  Surgeon: Ronald Gallo, MD;  Location: [place];  Service: Urology;  Laterality: N/A;  [place] N/A 05/09/2020   Procedure: CYSTOSCOPY WITH BALLOON DILATATION OF POSTERIOR URETHRAL STRICTURE, FLUOROSCOPIC INTERPRETATION;  Surgeon: Ronald Gallo, MD;  Location: [facility];  Service: Urology;  Laterality: N/A;   KNEE SURGERY  yrs ago   exploratory/left   SKIN CANCER EXCISION  01/2014   face   TRANSURETHRAL RESECTION OF PROSTATE     2008   Patient Active Problem List   Diagnosis Date Noted   Osteoarthritis cervical spine 04/01/2022   Rib pain on right side 12/10/2020   Hyperlipidemia 10/11/2020   Bilateral olecranon bursitis 07/05/2019   Other bursitis of knee, left knee 12/06/2018   Pain in right leg 12/06/2018    Parkinson disease     PCP: Ronald Infante MD  REFERRING PROVIDER: Dr. Ileene Noble (previously referred by Ronald Noble)  REFERRING DIAG: M54.2 (ICD-10-CM) - Neck pain  THERAPY DIAG:  Cervicalgia  Abnormal posture  Rationale for Evaluation and Treatment: Rehabilitation  ONSET DATE: 10/2021 insidious onset  SUBJECTIVE:                           "[name]"  SUBJECTIVE STATEMENT: He indicated he had some pain c turning head to Lt but at rest was painfree.   PERTINENT HISTORY:  Parkinsons, deep brain stimulator (travels Lt side of neck), History of skin cancer  PAIN:  NPRS scale: no pain at rest.  Pain location: cervical Pain description: pulling, tightness, spasm, reaching with Lt arm Aggravating factors: Lt turning, looking up Relieving factors: no specific movements performed  PRECAUTIONS: None  WEIGHT BEARING RESTRICTIONS: No  FALLS:  Has patient fallen in last 6 months? Yes. Number of falls 1-2 with trip to ER around Christmas Eve He reported not in fear of falling.   LIVING ENVIRONMENT: Lives with: lives with their spouse Lives in: House/apartment Has following equipment at home: has cane, uses occasional.   OCCUPATION: Retired  PLOF: Independent, [place] handed, watch TV, sit at computer, light yard work   PATIENT GOALS: Help pain, turn head better  OBJECTIVE:   PATIENT SURVEYS:  04/27/2022 FOTO intake:  55  predicted:  64  COGNITION: 04/27/2022 Overall cognitive status: Within functional limits for tasks assessed  SENSATION: 04/27/2022 No specific testing  POSTURE: 04/27/2022 rounded shoulders, forward head, and increased thoracic kyphosis, seated lean to Rt   PALPATION: 04/27/2022 Tenderness in lt upper trap, levator scap, Lt cervical paraspinals   CERVICAL ROM:   ROM AROM  (deg) 04/27/2022 AROM 05/05/2022  Flexion 40 c pain 43  Extension 25 c pain 30  Right lateral flexion    Left lateral flexion    Right rotation 46 65  Left rotation 50 c pain 48 (improved to 60 after manual)   (Blank rows = not tested)  UPPER EXTREMITY ROM:  ROM Right 04/27/2022 Left 04/27/2022  Shoulder flexion  Seated AROM limited to 100 deg c pain noted in Lt cervical  Shoulder extension    Shoulder abduction    Shoulder adduction    Shoulder extension    Shoulder internal rotation    Shoulder external rotation    Elbow flexion    Elbow extension    Wrist flexion    Wrist extension    Wrist ulnar deviation    Wrist radial deviation    Wrist pronation    Wrist supination     (Blank rows = not tested)  UPPER EXTREMITY MMT:  MMT Right 04/27/2022 Left 04/27/2022  Shoulder flexion 5/5 4/5 c pain shoulder pain  Shoulder extension    Shoulder abduction 5/5 4/5 c shoulder pain  Shoulder adduction    Shoulder extension    Shoulder internal rotation 5/5 5/5  Shoulder external rotation 5/5 4/5  Middle trapezius    Lower trapezius    Elbow flexion    Elbow extension    Wrist flexion    Wrist extension    Wrist ulnar deviation    Wrist radial deviation    Wrist pronation    Wrist supination    Grip strength     (Blank rows = not tested)  CERVICAL SPECIAL TESTS:  04/27/2022 No specific testing today  FUNCTIONAL TESTS:  04/27/2022 No specific testing today   TODAY'S TREATMENT:  DATE: 05/05/2022 Therex: UBE fwd/back 3 mins each way lvl 2.0 UE only with 1 min rest break in middle Green band rows bilateral 2 x 15 (cues for slowing motion down) Green band GH ext 2 x 15 Seated Lt upper trap self stretch 15 sec x 5 (cues for technique) Seated cervical AROM Lt and Rt rotation 20x (cues for home use) Additional time spent in education of new exercise routine added to HEP.    Manual: Supine  cervical downslope g3 mobs Lt C4-C7, supine unilateral PA Lt and Rt g3 mobs C5-C7, ROM     TODAY'S TREATMENT:                                                                                   DATE: 04/27/2022 Therex:    HEP instruction/performance c cues for techniques, handout provided.  Trial set performed of each for comprehension and symptom assessment.  See below for exercise list  PATIENT EDUCATION:  05/05/2022: Education details: HEP update Person educated: Patient Education method: Consulting civil engineer, Demonstration, Verbal cues, and Handouts Education comprehension: verbalized understanding, returned demonstration, and verbal cues required  HOME EXERCISE PROGRAM: Access Code: [email or web address] URL: [email or web address] Date: 05/05/2022 Prepared by: Scot Jun  Exercises - Seated Scapular Retraction  - 3-5 x daily - 7 x weekly - 1 sets - 10 reps - 3-5 hold - Seated Upper Trapezius Stretch (Mirrored)  - 2 x daily - 7 x weekly - 1 sets - 5 reps - 15 hold - Standing Shoulder Row with Anchored Resistance  - 1-2 x daily - 7 x weekly - 1-2 sets - 10-15 reps - Shoulder extension with resistance - Neutral  - 1-2 x daily - 7 x weekly - 1-2 sets - 10-15 reps - Supine Cervical Rotation AROM on Pillow  - 3-5 x daily - 7 x weekly - 3 sets - 10 reps  ASSESSMENT:  CLINICAL IMPRESSION: Cues required in review of HEP but overall good recall.  Noted improvement in mobility following manual and therex intervention.  Noted restriction and pain produced c PAIVM assessment in cervical throughout.  Continued skilled PT services indicated at this time.    OBJECTIVE IMPAIRMENTS: Abnormal gait, decreased activity tolerance, decreased balance, decreased coordination, decreased endurance, decreased mobility, difficulty walking, decreased ROM, decreased strength, hypomobility, increased fascial restrictions, impaired perceived functional ability, increased muscle spasms, impaired flexibility, impaired  UE functional use, improper body mechanics, postural dysfunction, and pain.   ACTIVITY LIMITATIONS: carrying, lifting, sitting, standing, sleeping, and reach over head  PARTICIPATION LIMITATIONS: meal prep, cleaning, interpersonal relationship, community activity, and yard work  PERSONAL FACTORS:  History of parkinson's c implant  are also affecting patient's functional outcome.   REHAB POTENTIAL: Good  CLINICAL DECISION MAKING: Stable/uncomplicated  EVALUATION COMPLEXITY: Low   GOALS: Goals reviewed with patient? Yes  SHORT TERM GOALS: (target date for Short term goals are 3 weeks 05/18/2022)  1.Patient will demonstrate independent use of home exercise program to maintain progress from in clinic treatments. Goal status: on going 05/05/2022  LONG TERM GOALS: (target dates for all long term goals are 10 weeks  07/06/2022 )   1. Patient will demonstrate/report pain at worst less than or  equal to 2/10 to facilitate minimal limitation in daily activity secondary to pain symptoms. Goal status: New   2. Patient will demonstrate independent use of home exercise program to facilitate ability to maintain/progress functional gains from skilled physical therapy services. Goal status: New   3. Patient will demonstrate FOTO outcome > or = 64 % to indicate reduced disability due to condition. Goal status: New   4.  Patient will demonstrate cervical AROM WFL s symptoms to facilitate usual head movements for daily activity including driving, self care.   Goal status: New   5.  Patient will demonstrate Lt shoulder MMT equal to Rt to facilitate usual lifting, reaching, carrying at PLOF.   Goal status: New   6.  Patient will demonstrate sleeping s restriction due to symptoms.  Goal status: New     PLAN:  PT FREQUENCY: 1-2x/week  PT DURATION: 10 weeks  PLANNED INTERVENTIONS: Therapeutic exercises, Therapeutic activity, Neuro Muscular re-education, Balance training, Gait training,  Patient/Family education, Joint mobilization, Stair training, DME instructions, Dry Needling, Electrical stimulation, Cryotherapy, vasopneumatic device,Traction, Moist heat, Taping, Ultrasound, Ionotophoresis '4mg'$ /ml Dexamethasone, and Manual therapy.  All included unless contraindicated  PLAN FOR NEXT SESSION: Manual for cervical mobility gains. Review HEP.   Brain stimulator in place  Scot Jun, PT, DPT, OCS, ATC 05/05/22  12:16 PM

## 2022-05-12 ENCOUNTER — Encounter: Payer: Medicare Other | Admitting: Rehabilitative and Restorative Service Providers"

## 2022-05-13 ENCOUNTER — Encounter: Payer: Medicare Other | Admitting: Rehabilitative and Restorative Service Providers"

## 2022-05-17 ENCOUNTER — Other Ambulatory Visit: Payer: Self-pay | Admitting: Neurology

## 2022-05-18 ENCOUNTER — Ambulatory Visit (INDEPENDENT_AMBULATORY_CARE_PROVIDER_SITE_OTHER): Payer: Medicare HMO | Admitting: Rehabilitative and Restorative Service Providers"

## 2022-05-18 ENCOUNTER — Encounter: Payer: Self-pay | Admitting: Rehabilitative and Restorative Service Providers"

## 2022-05-18 ENCOUNTER — Other Ambulatory Visit: Payer: Self-pay | Admitting: *Deleted

## 2022-05-18 DIAGNOSIS — M542 Cervicalgia: Secondary | ICD-10-CM | POA: Diagnosis not present

## 2022-05-18 DIAGNOSIS — R293 Abnormal posture: Secondary | ICD-10-CM | POA: Diagnosis not present

## 2022-05-18 DIAGNOSIS — G20B2 Parkinson's disease with dyskinesia, with fluctuations: Secondary | ICD-10-CM

## 2022-05-18 MED ORDER — AMANTADINE HCL 100 MG PO CAPS: ORAL_CAPSULE | ORAL | 1 refills | Status: DC

## 2022-05-18 NOTE — Therapy (Signed)
OUTPATIENT PHYSICAL THERAPY TREATMENT   Patient Name: Ronald Noble MRN: [id number] DOB:04/21/1945, 77 y.o., male Today's Date: 05/18/2022  END OF SESSION:  PT End of Session - 05/18/22 1057     Visit Number 3    Number of Visits 20    Date for PT Re-Evaluation 07/06/22    PT Start Time [id number]    PT Stop Time 1119    PT Time Calculation (min) 30 min    Activity Tolerance Treatment limited secondary to medical complications (Comment)   medication troubles related to parkinson's history   Behavior During Therapy Ronald Noble               Past Medical History:  Diagnosis Date   BPH (benign prostatic hyperplasia)    COVID 04/24/2020   scratchy throat x 1 day all symptoms resolved   Parkinson disease    Skin cancer of face    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   Sleep apnea    mild no cpap used   Urethral stricture    Past Surgical History:  Procedure Laterality Date   BURR HOLE W/ STEREOTACTIC INSERTION OF DBS LEADS / INTRAOP MICROELECTRODE RECORDING  04/2014   [facility] in [facility]   COLONOSCOPY  2019   CYSTOSCOPY WITH URETHRAL DILATATION N/A 06/15/2019   Procedure: CYSTOSCOPY WITH URETHRAL DILATATION;  Surgeon: Ronald Gallo, MD;  Location: [place];  Service: Urology;  Laterality: N/A;  [place] N/A 05/09/2020   Procedure: CYSTOSCOPY WITH BALLOON DILATATION OF POSTERIOR URETHRAL STRICTURE, FLUOROSCOPIC INTERPRETATION;  Surgeon: Ronald Gallo, MD;  Location: [facility];  Service: Urology;  Laterality: N/A;   KNEE SURGERY  yrs ago   exploratory/left   SKIN CANCER EXCISION  01/2014   face   TRANSURETHRAL RESECTION OF PROSTATE     2008   Patient Active Problem List   Diagnosis Date Noted   Osteoarthritis cervical spine 04/01/2022   Rib pain on right side 12/10/2020   Hyperlipidemia 10/11/2020   Bilateral olecranon bursitis  07/05/2019   Other bursitis of knee, left knee 12/06/2018   Pain in right leg 12/06/2018   Parkinson disease     PCP: Ronald Infante MD  REFERRING PROVIDER: Dr. Ileene Noble (previously referred by Ronald Noble)  REFERRING DIAG: M54.2 (ICD-10-CM) - Neck pain  THERAPY DIAG:  Cervicalgia  Abnormal posture  Rationale for Evaluation and Treatment: Rehabilitation  ONSET DATE: 10/2021 insidious onset  SUBJECTIVE:                           "Ronald Noble"  SUBJECTIVE STATEMENT: He indicated feeling sore in neck but it wasn't bad over the weekend.  He felt like the execise helps but does still wake up in the morning with pain.  He reported having some side effects with medicine today.   PERTINENT HISTORY:  Parkinsons, deep brain stimulator (travels Lt side of neck), History of skin cancer  PAIN:  NPRS scale: no pain at rest.  Pain location: cervical Pain description: pulling, tightness, spasm, reaching with Lt arm Aggravating factors: Lt turning, looking up Relieving factors: no specific movements performed  PRECAUTIONS: None  WEIGHT BEARING RESTRICTIONS: No  FALLS:  Has patient fallen in last 6 months? Yes. Number of falls 1-2 with trip to ER around Christmas Eve He reported not in fear of falling.   LIVING ENVIRONMENT: Lives with: lives with their spouse Lives in: House/apartment Has following equipment at home: has cane, uses occasional.   OCCUPATION: Retired  PLOF: Independent, [place] handed, watch TV, sit at computer, light yard work   PATIENT GOALS: Help pain, turn head better  OBJECTIVE:   PATIENT SURVEYS:  04/27/2022 FOTO intake:  55  predicted:  57  COGNITION: 04/27/2022 Overall cognitive status: Within functional limits for tasks assessed  SENSATION: 04/27/2022 No specific  testing  POSTURE: 05/18/2022:  increased noted Rt lateral list in sitting.   04/27/2022 rounded shoulders, forward head, and increased thoracic kyphosis, seated lean to Rt   PALPATION: 04/27/2022 Tenderness in lt upper trap, levator scap, Lt cervical paraspinals   CERVICAL ROM:   ROM AROM (deg) 04/27/2022 AROM 05/05/2022 AROM 05/18/2022  Flexion 40 c pain 43 50  Extension 25 c pain 30 35  Right lateral flexion     Left lateral flexion     Right rotation 46 65 62  Left rotation 50 c pain 48 (improved to 60 after manual) 48   (Blank rows = not tested)  UPPER EXTREMITY ROM:  ROM Right 04/27/2022 Left 04/27/2022  Shoulder flexion  Seated AROM limited to 100 deg c pain noted in Lt cervical  Shoulder extension    Shoulder abduction    Shoulder adduction    Shoulder extension    Shoulder internal rotation    Shoulder external rotation    Elbow flexion    Elbow extension    Wrist flexion    Wrist extension    Wrist ulnar deviation    Wrist radial deviation    Wrist pronation    Wrist supination     (Blank rows = not tested)  UPPER EXTREMITY MMT:  MMT Right 04/27/2022 Left 04/27/2022 Left 05/18/2022  Shoulder flexion 5/5 4/5 c pain shoulder pain 4+/5  Shoulder extension     Shoulder abduction 5/5 4/5 c shoulder pain 4/5  Shoulder adduction     Shoulder extension     Shoulder internal rotation 5/5 5/5   Shoulder external rotation 5/5 4/5 4+/5  Middle trapezius     Lower trapezius     Elbow flexion     Elbow extension     Wrist flexion     Wrist extension     Wrist ulnar deviation     Wrist radial deviation     Wrist pronation     Wrist supination     Grip strength      (Blank rows = not tested)  CERVICAL SPECIAL TESTS:  04/27/2022 No specific testing today  FUNCTIONAL TESTS:  04/27/2022 No specific testing today   TODAY'S TREATMENT:  DATE: 05/18/2022 Therex: Nustep Lvl 5 UE/LE  10 mins Seated green band bilateral shoulder ER c scapular retraction 2 x 10, no band x 10 2-3  sec hold Seated thoracic extension in chair 2-3 sec hold x 10  Seated green band rows c handles 2 x 15  Additional time spent in education review of HEP     TODAY'S TREATMENT:                                                                                   DATE: 05/05/2022 Therex: UBE fwd/back 3 mins each way lvl 2.0 UE only with 1 min rest break in middle Green band rows bilateral 2 x 15 (cues for slowing motion down) Green band GH ext 2 x 15 Seated Lt upper trap self stretch 15 sec x 5 (cues for technique) Seated cervical AROM Lt and Rt rotation 20x (cues for home use) Additional time spent in education of new exercise routine added to HEP.    Manual: Supine cervical downslope g3 mobs Lt C4-C7, supine unilateral PA Lt and Rt g3 mobs C5-C7, ROM   TODAY'S TREATMENT:                                                                                   DATE: 04/27/2022 Therex:    HEP instruction/performance c cues for techniques, handout provided.  Trial set performed of each for comprehension and symptom assessment.  See below for exercise list  PATIENT EDUCATION:  05/05/2022: Education details: HEP update Person educated: Patient Education method: Consulting civil engineer, Demonstration, Verbal cues, and Handouts Education comprehension: verbalized understanding, returned demonstration, and verbal cues required  HOME EXERCISE PROGRAM: Access Code: [email or web address] URL: [email or web address] Date: 05/18/2022 Prepared by: Scot Jun  Exercises - Seated Upper Trapezius Stretch (Mirrored)  - 2 x daily - 7 x weekly - 1 sets - 5 reps - 15 hold - Standing Shoulder Row with Anchored Resistance  - 1-2 x daily - 7 x weekly - 1-2 sets - 10-15 reps - Shoulder extension with resistance - Neutral  - 1-2 x daily - 7 x weekly - 1-2 sets - 10-15 reps - Supine Cervical Rotation AROM on Pillow  - 3-5 x daily  - 7 x weekly - 3 sets - 10 reps - Seated Thoracic Lumbar Extension  - 2-3 x daily - 7 x weekly - 1 sets - 10 reps - Shoulder External Rotation and Scapular Retraction  - 2-3 x daily - 7 x weekly - 1 sets - 10 reps - 2 hold  ASSESSMENT:  CLINICAL IMPRESSION: Presentation limited today due to increased difficulty in motor control facially and UE related to Pt report of challenges related to parkinson's and medicine.   Pt continued to present c cervical mobility deficits.  Mild improvements noted in Lt upper extremity strength today in reassessment.  Continued focus in skilled  PT services on pain reduction, mobility gains and improved postural strength/enduranc.e    OBJECTIVE IMPAIRMENTS: Abnormal gait, decreased activity tolerance, decreased balance, decreased coordination, decreased endurance, decreased mobility, difficulty walking, decreased ROM, decreased strength, hypomobility, increased fascial restrictions, impaired perceived functional ability, increased muscle spasms, impaired flexibility, impaired UE functional use, improper body mechanics, postural dysfunction, and pain.   ACTIVITY LIMITATIONS: carrying, lifting, sitting, standing, sleeping, and reach over head  PARTICIPATION LIMITATIONS: meal prep, cleaning, interpersonal relationship, community activity, and yard work  PERSONAL FACTORS:  History of parkinson's c implant  are also affecting patient's functional outcome.   REHAB POTENTIAL: Good  CLINICAL DECISION MAKING: Stable/uncomplicated  EVALUATION COMPLEXITY: Low   GOALS: Goals reviewed with patient? Yes  SHORT TERM GOALS: (target date for Short term goals are 3 weeks 05/18/2022)  1.Patient will demonstrate independent use of home exercise program to maintain progress from in clinic treatments. Goal status: Met  LONG TERM GOALS: (target dates for all long term goals are 10 weeks  07/06/2022 )   1. Patient will demonstrate/report pain at worst less than or equal to 2/10  to facilitate minimal limitation in daily activity secondary to pain symptoms. Goal status: on going 05/18/2022   2. Patient will demonstrate independent use of home exercise program to facilitate ability to maintain/progress functional gains from skilled physical therapy services. Goal status:  on going 05/18/2022   3. Patient will demonstrate FOTO outcome > or = 64 % to indicate reduced disability due to condition. Goal status:  on going 05/18/2022   4.  Patient will demonstrate cervical AROM WFL s symptoms to facilitate usual head movements for daily activity including driving, self care.   Goal status:  on going 05/18/2022   5.  Patient will demonstrate Lt shoulder MMT equal to Rt to facilitate usual lifting, reaching, carrying at PLOF.   Goal status:  on going 05/18/2022   6.  Patient will demonstrate sleeping s restriction due to symptoms.  Goal status:  on going 05/18/2022     PLAN:  PT FREQUENCY: 1-2x/week  PT DURATION: 10 weeks  PLANNED INTERVENTIONS: Therapeutic exercises, Therapeutic activity, Neuro Muscular re-education, Balance training, Gait training, Patient/Family education, Joint mobilization, Stair training, DME instructions, Dry Needling, Electrical stimulation, Cryotherapy, vasopneumatic device,Traction, Moist heat, Taping, Ultrasound, Ionotophoresis 56m/ml Dexamethasone, and Manual therapy.  All included unless contraindicated  PLAN FOR NEXT SESSION: Manual for cervical mobility gains, postural strengthening. Review HEP.   Brain stimulator in place  MScot Jun PT, DPT, OCS, ATC 05/18/22  11:20 AM

## 2022-05-25 ENCOUNTER — Encounter: Payer: Medicare Other | Admitting: Rehabilitative and Restorative Service Providers"

## 2022-06-01 ENCOUNTER — Encounter: Payer: Medicare Other | Admitting: Rehabilitative and Restorative Service Providers"

## 2022-06-03 ENCOUNTER — Encounter: Payer: Medicare Other | Admitting: Rehabilitative and Restorative Service Providers"

## 2022-06-08 ENCOUNTER — Other Ambulatory Visit: Payer: Self-pay | Admitting: Neurology

## 2022-06-08 ENCOUNTER — Ambulatory Visit (INDEPENDENT_AMBULATORY_CARE_PROVIDER_SITE_OTHER): Payer: Medicare HMO | Admitting: Rehabilitative and Restorative Service Providers"

## 2022-06-08 DIAGNOSIS — M542 Cervicalgia: Secondary | ICD-10-CM

## 2022-06-08 DIAGNOSIS — R293 Abnormal posture: Secondary | ICD-10-CM | POA: Diagnosis not present

## 2022-06-08 NOTE — Therapy (Addendum)
OUTPATIENT PHYSICAL THERAPY TREATMENT /DISCHARGE   Patient Name: Ronald Noble MRN: [id number] DOB:[birth year], 77 y.o., male Today's Date: 06/08/2022  END OF SESSION:  PT End of Session - 06/08/22 1137     Visit Number 4    Number of Visits 20    Date for PT Re-Evaluation 07/06/22    PT Start Time 1144    PT Stop Time 1212    PT Time Calculation (min) 28 min    Activity Tolerance Patient tolerated treatment well    Behavior During Therapy [facility] for tasks assessed/performed                Past Medical History:  Diagnosis Date   BPH (benign prostatic hyperplasia)    COVID 04/24/2020   scratchy throat x 1 day all symptoms resolved   Parkinson disease    Skin cancer of face    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   Sleep apnea    mild no cpap used   Urethral stricture    Past Surgical History:  Procedure Laterality Date   BURR HOLE W/ STEREOTACTIC INSERTION OF DBS LEADS / INTRAOP MICROELECTRODE RECORDING  04/2014   [facility], new [facility] replaced in 2020   COLONOSCOPY  2019   CYSTOSCOPY WITH URETHRAL DILATATION N/A 06/15/2019   Procedure: CYSTOSCOPY WITH URETHRAL DILATATION;  Surgeon: Marcine Matar, MD;  Location: [facility] [place];  Service: Urology;  Laterality: N/A;  30 MINS   CYSTOSCOPY WITH URETHRAL DILATATION N/A 05/09/2020   Procedure: CYSTOSCOPY WITH BALLOON DILATATION OF POSTERIOR URETHRAL STRICTURE, FLUOROSCOPIC INTERPRETATION;  Surgeon: Marcine Matar, MD;  Location: [facility];  Service: Urology;  Laterality: N/A;   KNEE SURGERY  yrs ago   exploratory/left   SKIN CANCER EXCISION  01/2014   face   TRANSURETHRAL RESECTION OF PROSTATE     2008   Patient Active Problem List   Diagnosis Date Noted   Osteoarthritis cervical spine 04/01/2022   Rib pain on right side 12/10/2020   Hyperlipidemia 10/11/2020   Bilateral olecranon bursitis 07/05/2019   Other bursitis of knee, left knee 12/06/2018   Pain in right leg  12/06/2018   Parkinson disease     PCP: Rodrigo Ran MD  REFERRING PROVIDER: Dr. Willia Craze (previously referred by Norlene Campbell)  REFERRING DIAG: M54.2 (ICD-10-CM) - Neck pain  THERAPY DIAG:  Cervicalgia  Abnormal posture  Rationale for Evaluation and Treatment: Rehabilitation  ONSET DATE: 10/2021 insidious onset  SUBJECTIVE:                           "[name]"  SUBJECTIVE STATEMENT: He indicated feeling some pain still but getting better with head movements.  He indicated having better time sleeping.  He indicated he was able to do some yard work.    PERTINENT HISTORY:  Parkinsons, deep brain stimulator (travels Lt side of neck), History of skin cancer  PAIN:  NPRS scale: no pain at rest, at worst 4/10 in last few days.  Pain location: cervical Pain description: pulling, tightness, spasm, reaching with Lt arm Aggravating factors: Lt turning, looking up Relieving factors: no specific movements performed  PRECAUTIONS: None  WEIGHT BEARING RESTRICTIONS: No  FALLS:  Has patient fallen in last 6 months? Yes. Number of falls 1-2 with trip to ER around Christmas Eve He reported not in fear of falling.   LIVING ENVIRONMENT: Lives with: lives with their spouse Lives in: House/apartment Has following equipment at home: has cane, uses occasional.   OCCUPATION: Retired  PLOF: Independent, Rt handed, watch TV, sit at computer, light yard work   PATIENT GOALS: Help pain, turn head better  OBJECTIVE:   PATIENT SURVEYS:  06/08/2022:  FOTO update:  65  04/27/2022 FOTO intake:  55  predicted:  64  COGNITION: 04/27/2022 Overall cognitive status: Within functional limits for tasks assessed  SENSATION: 04/27/2022 No specific testing  POSTURE: 05/18/2022:  increased noted Rt lateral list in  sitting.   04/27/2022 rounded shoulders, forward head, and increased thoracic kyphosis, seated lean to Rt   PALPATION: 04/27/2022 Tenderness in lt upper trap, levator scap, Lt cervical paraspinals   CERVICAL ROM:   ROM AROM (deg) 04/27/2022 AROM 05/05/2022 AROM 05/18/2022 AROM 06/08/2022  Flexion 40 c pain 43 50 60  Extension 25 c pain 30 35 40  Right lateral flexion      Left lateral flexion      Right rotation 46 65 62 74  Left rotation 50 c pain 48 (improved to 60 after manual) 48 75   (Blank rows = not tested)  UPPER EXTREMITY ROM:  ROM Right 04/27/2022 Left 04/27/2022 Left 06/08/2022  Shoulder flexion  Seated AROM limited to 100 deg c pain noted in Lt cervical Seated AROM [facility]  Shoulder extension     Shoulder abduction     Shoulder adduction     Shoulder extension     Shoulder internal rotation     Shoulder external rotation     Elbow flexion     Elbow extension     Wrist flexion     Wrist extension     Wrist ulnar deviation     Wrist radial deviation     Wrist pronation     Wrist supination      (Blank rows = not tested)  UPPER EXTREMITY MMT:  MMT Right 04/27/2022 Left 04/27/2022 Left 05/18/2022 Left 06/08/2022  Shoulder flexion 5/5 4/5 c pain shoulder pain 4+/5 5/5  Shoulder extension      Shoulder abduction 5/5 4/5 c shoulder pain 4/5 5/5  Shoulder adduction      Shoulder extension      Shoulder internal rotation 5/5 5/5  5/5  Shoulder external rotation 5/5 4/5 4+/5 5/5  Middle trapezius      Lower trapezius      Elbow flexion      Elbow extension      Wrist flexion      Wrist extension      Wrist ulnar deviation      Wrist radial deviation      Wrist pronation      Wrist  supination      Grip strength       (Blank rows = not tested)  CERVICAL SPECIAL TESTS:  04/27/2022 No specific testing today  FUNCTIONAL TESTS:  04/27/2022 No specific testing today   TODAY'S TREATMENT:                                                                                    DATE: 06/08/2022 Therex: Additional time spent in education review of HEP   Seated Lt upper trap stretch 15 sec x 3 Seated cervical AROM x 10 Lt and Rt cervical rotation Seated bilateral UE ER c scap retraction green band 2 x 10   Nustep Lvl 6 UE/LE 10 mins   TODAY'S TREATMENT:                                                                                   DATE: 05/18/2022 Therex: Nustep Lvl 5 UE/LE 10 mins Seated green band bilateral shoulder ER c scapular retraction 2 x 10, no band x 10 2-3  sec hold Seated thoracic extension in chair 2-3 sec hold x 10  Seated green band rows c handles 2 x 15  Additional time spent in education review of HEP     TODAY'S TREATMENT:                                                                                   DATE: 05/05/2022 Therex: UBE fwd/back 3 mins each way lvl 2.0 UE only with 1 min rest break in middle Green band rows bilateral 2 x 15 (cues for slowing motion down) Green band GH ext 2 x 15 Seated Lt upper trap self stretch 15 sec x 5 (cues for technique) Seated cervical AROM Lt and Rt rotation 20x (cues for home use) Additional time spent in education of new exercise routine added to HEP.    Manual: Supine cervical downslope g3 mobs Lt C4-C7, supine unilateral PA Lt and Rt g3 mobs C5-C7, ROM   PATIENT EDUCATION:  06/08/2022: Education details: HEP update Person educated: Patient Education method: Consulting civil engineer, Demonstration, Verbal cues, and Handouts Education comprehension: verbalized understanding, returned demonstration, and verbal cues required  HOME EXERCISE PROGRAM: Access Code: [email or web address] URL: [email or web address] Date: 05/18/2022 Prepared by: Scot Jun  Exercises - Seated Upper Trapezius Stretch (Mirrored)  - 2 x daily - 7 x weekly - 1 sets - 5 reps - 15 hold - Standing Shoulder Row with Anchored Resistance  - 1-2 x daily - 7 x weekly - 1-2 sets - 10-15 reps -  Shoulder extension with resistance -  Neutral  - 1-2 x daily - 7 x weekly - 1-2 sets - 10-15 reps - Supine Cervical Rotation AROM on Pillow  - 3-5 x daily - 7 x weekly - 3 sets - 10 reps - Seated Thoracic Lumbar Extension  - 2-3 x daily - 7 x weekly - 1 sets - 10 reps - Shoulder External Rotation and Scapular Retraction  - 2-3 x daily - 7 x weekly - 1 sets - 10 reps - 2 hold  ASSESSMENT:  CLINICAL IMPRESSION: Pt has attended 4 visits overall and reported improvement in symptoms and ability.  See objective data for updated information.  Due to improvements to this point, Pt and clinician were in agreement to trial HEP at this time to continue to improve symptoms going forward.    OBJECTIVE IMPAIRMENTS: Abnormal gait, decreased activity tolerance, decreased balance, decreased coordination, decreased endurance, decreased mobility, difficulty walking, decreased ROM, decreased strength, hypomobility, increased fascial restrictions, impaired perceived functional ability, increased muscle spasms, impaired flexibility, impaired UE functional use, improper body mechanics, postural dysfunction, and pain.   ACTIVITY LIMITATIONS: carrying, lifting, sitting, standing, sleeping, and reach over head  PARTICIPATION LIMITATIONS: meal prep, cleaning, interpersonal relationship, community activity, and yard work  PERSONAL FACTORS:  History of parkinson's c implant  are also affecting patient's functional outcome.   REHAB POTENTIAL: Good  CLINICAL DECISION MAKING: Stable/uncomplicated  EVALUATION COMPLEXITY: Low   GOALS: Goals reviewed with patient? Yes  SHORT TERM GOALS: (target date for Short term goals are 3 weeks 05/18/2022)  1.Patient will demonstrate independent use of home exercise program to maintain progress from in clinic treatments. Goal status: Met  LONG TERM GOALS: (target dates for all long term goals are 10 weeks  07/06/2022 )   1. Patient will demonstrate/report pain at worst less than or equal to 2/10 to facilitate minimal  limitation in daily activity secondary to pain symptoms. Goal status: on going 06/08/2022   2. Patient will demonstrate independent use of home exercise program to facilitate ability to maintain/progress functional gains from skilled physical therapy services. Goal status:  Met 06/08/2022   3. Patient will demonstrate FOTO outcome > or = 64 % to indicate reduced disability due to condition. Goal status:  Met 06/08/2022   4.  Patient will demonstrate cervical AROM WFL s symptoms to facilitate usual head movements for daily activity including driving, self care.   Goal status:  on going 06/08/2022   5.  Patient will demonstrate Lt shoulder MMT equal to Rt to facilitate usual lifting, reaching, carrying at PLOF.   Goal status:  Met 06/08/2022   6.  Patient will demonstrate sleeping s restriction due to symptoms.  Goal status:  Met 06/08/2022     PLAN:  PT FREQUENCY: 1-2x/week  PT DURATION: 10 weeks  PLANNED INTERVENTIONS: Therapeutic exercises, Therapeutic activity, Neuro Muscular re-education, Balance training, Gait training, Patient/Family education, Joint mobilization, Stair training, DME instructions, Dry Needling, Electrical stimulation, Cryotherapy, vasopneumatic device,Traction, Moist heat, Taping, Ultrasound, Ionotophoresis 4mg /ml Dexamethasone, and Manual therapy.  All included unless contraindicated  PLAN FOR NEXT SESSION: Trial HEP.  Discharge after 30 days inactivity.   Brain stimulator in place  Chyrel Masson, PT, DPT, OCS, ATC 06/08/22  12:08 PM   PHYSICAL THERAPY DISCHARGE SUMMARY  Visits from Start of Care: 4  Current functional level related to goals / functional outcomes: See note   Remaining deficits: See note   Education / Equipment: HEP  Patient goals were  mostly met .  Patient is being discharged due to not returning since the last visit.  Chyrel Masson, PT, DPT, OCS, ATC 07/30/22  9:53 AM

## 2022-06-09 ENCOUNTER — Encounter: Payer: Self-pay | Admitting: Speech Pathology

## 2022-06-09 DIAGNOSIS — R471 Dysarthria and anarthria: Secondary | ICD-10-CM

## 2022-06-09 DIAGNOSIS — R41841 Cognitive communication deficit: Secondary | ICD-10-CM

## 2022-06-24 ENCOUNTER — Ambulatory Visit: Payer: Medicare Other | Admitting: Orthopedic Surgery

## 2022-06-29 DIAGNOSIS — N401 Enlarged prostate with lower urinary tract symptoms: Secondary | ICD-10-CM | POA: Diagnosis not present

## 2022-06-29 DIAGNOSIS — R350 Frequency of micturition: Secondary | ICD-10-CM | POA: Diagnosis not present

## 2022-07-09 ENCOUNTER — Encounter (HOSPITAL_COMMUNITY): Payer: Medicare Other

## 2022-07-15 ENCOUNTER — Encounter (HOSPITAL_COMMUNITY): Payer: Medicare Other

## 2022-07-17 ENCOUNTER — Encounter (HOSPITAL_COMMUNITY): Payer: Medicare Other

## 2022-07-23 ENCOUNTER — Encounter (HOSPITAL_COMMUNITY): Payer: Medicare Other

## 2022-07-28 ENCOUNTER — Other Ambulatory Visit (HOSPITAL_COMMUNITY): Payer: Self-pay

## 2022-07-28 ENCOUNTER — Encounter (HOSPITAL_COMMUNITY)
Admission: RE | Admit: 2022-07-28 | Discharge: 2022-07-28 | Disposition: A | Discharge status: Home / Self Care | Payer: Medicare HMO | Source: Ambulatory Visit | Attending: Internal Medicine | Admitting: Internal Medicine

## 2022-07-28 DIAGNOSIS — I2510 Atherosclerotic heart disease of native coronary artery without angina pectoris: Secondary | ICD-10-CM | POA: Diagnosis not present

## 2022-07-28 DIAGNOSIS — E785 Hyperlipidemia, unspecified: Secondary | ICD-10-CM | POA: Insufficient documentation

## 2022-07-28 MED ORDER — INCLISIRAN SODIUM 284 MG/1.5ML ~~LOC~~ SOSY
PREFILLED_SYRINGE | SUBCUTANEOUS | Status: AC
  Filled 2022-07-28: qty 1.5

## 2022-07-28 MED ORDER — INCLISIRAN SODIUM 284 MG/1.5ML ~~LOC~~ SOSY
284.0000 mg | PREFILLED_SYRINGE | Freq: Once | SUBCUTANEOUS | Status: AC
  Administered 2022-07-28: 284 mg via SUBCUTANEOUS

## 2022-07-29 ENCOUNTER — Ambulatory Visit: Payer: Medicare HMO | Admitting: Neurology

## 2022-07-29 ENCOUNTER — Encounter: Payer: Self-pay | Admitting: Neurology

## 2022-07-29 VITALS — BP 135/77 | HR 54 | Ht 71.0 in | Wt 158.0 lb

## 2022-07-29 DIAGNOSIS — Z9689 Presence of other specified functional implants: Secondary | ICD-10-CM | POA: Diagnosis not present

## 2022-07-29 DIAGNOSIS — G20B2 Parkinson's disease with dyskinesia, with fluctuations: Secondary | ICD-10-CM | POA: Diagnosis not present

## 2022-07-29 DIAGNOSIS — G249 Dystonia, unspecified: Secondary | ICD-10-CM | POA: Diagnosis not present

## 2022-07-29 NOTE — Patient Instructions (Signed)
We will reduce your Sinemet to 1 pill 3 times a day, to see if your facial dyskinesias improve.  Take 1 pill at 8 AM, 12 and 4 PM.  Take amantadine 2 times a day, at 8 AM and 4 PM.  Take ropinirole ER 6 mg once daily between 5 and 7 PM. Follow up with Dr. Rubin Payor and Valentina Shaggy as scheduled.  Follow up with me in 6 months.

## 2022-07-29 NOTE — Progress Notes (Signed)
Subjective:    Patient ID: Ronald Noble is a 77 y.o. male.  HPI    Interim history:   Ronald Noble is a 77 year old right-handed gentleman with an underlying medical history of skin cancer including melanoma, enlarged prostate (s/p TURP), who presents for followup consultation of his R sided predominant Parkinson's disease, complicated by blepharospasm (for which he receives Xeomin inj. At [facility]), sialorrhea, RBD and sleep disturbance as well as dyskinesias, s/p bilateral DBS placements at [facility], s/p L IPG replacement on the L in 09/2018 and L IPG replacement under Dr. Angelyn Punt at [facility] on 02/12/2021. He is accompanied by his wife again today. I last saw him on 01/27/2022, at which time he reported still having significant facial dyskinesias about 45 minutes after taking levodopa. He had gone to the ED on 12/08/2021 after a fall and he sustained a laceration to the lip, which was repaired with glue. He had a CT head without contrast, CT cervical spine without contrast and CT maxillofacial without contrast on 12/08/2021 and I reviewed: IMPRESSION: No evidence of acute intracranial injury or acute maxillofacial or cervical spine fracture. He sustained a leg laceration at a shoe store in June 2023, when he caught his leg at a display stand. He required stiches at the emergency room records on 09/27/2021. He had a prolonged healing process and needed to go to wound care for several weeks for regular dressing changes. He retired in April 2023. He was in the process of getting a U-step walker.   Today, 07/29/22: He reports ongoing issues with facial dykinesias, lasting anywhere from 15 minutes to an hour, shortly after he takes his Sinemet.  He takes 1-1/2 pills in the morning and ends up taking 3 or maybe 4 doses per day, the rest of the dose is only 1 pill at a time.  He takes ropinirole between 5 and 7 PM and takes amantadine twice daily.  He has not actually fallen recently thankfully.  His wife  has captured his typical facial dyskinesias on video on her cell phone and we reviewed it today.  He has quite significant facial distortion and blepharospasm.  He did run into a door recently as he was having facial dyskinesia and did not see properly.  He saw Valentina Shaggy, PA at [facility] on 03/04/2022.  He was advised to take Sinemet 25-100 mg strength 1-1/2 pills 4 times a day, 3 hourly.  He is scheduled to see Dr. Rubin Payor next month.  The patient's allergies, current medications, family history, past medical history, past social history, past surgical history and problem list were reviewed and updated as appropriate.    Previously (copied from previous notes for reference):      I saw him on 07/16/2021, at which time he was having ongoing issues with facial dyskinesias.  He had sustained additional falls unfortunately.  He was supposed to stop walker.     He had an appointment for botulinum toxin injection for sialorrhea in May 2023 with Dr. Rubin Payor, he had a follow-up appointment with Valentina Shaggy at [facility] on 09/22/2021.    He had a follow-up with Dr. Rubin Payor for his blepharospasm in August 2023.       I saw him on 03/17/2021, at which time he reported difficulty controlling his movements and difficulty stopping when he started walking.  He had facial dyskinesias.  He continued to have Xeomin injections for blepharospasm with Dr. Rubin Payor at [facility].  He had developed eyelid swelling.  He was advised to make an appointment with his eye specialist.  He had an interim appointment with Dr. Manning Charity in December 2022 for blepharitis.  He had an emergency room visit in late December 2022 after a fall.  I reviewed the emergency room records.  He had a follow-up appointment with Valentina Shaggy, PA at [facility] on 04/11/2021, at which time he was advised to increase Sinemet to 1-1/2 tablets every 3 hours, for a total of 5 times a day.  No changes in his stimulations were done at the  time.  He had festination and was advised to start using a U step walker rather than a cane.  He had an appointment with Dr. Rubin Payor on 05/07/2021, at which time he was advised to continue with Sinemet, Requip XL 6 mg daily and amantadine 100 mg twice daily.  His MoCA was 26 out of 30 and the addition of Aricept was discussed.  He had Xeomin injection on 05/07/2021 for blepharospasm.  He presented to the emergency room on 06/04/2021 after a fall.  He sustained an abrasion to the left temple and eyelid area.  I reviewed the emergency room records.       I saw him on 11/26/2020, at which time he reported increase in facial dyskinesias.  He had concerns about his balance and difficulty controlling his walking speed.  He had not made much in the way of changes to his medication regimen we had discussed at our last appointment.  He was advised to reduce his long-acting ropinirole to 4 mg daily.  He was advised to take Sinemet 25-100 mg strength 1 pill 7 times a day at 2-hour intervals.  He was advised to continue with amantadine 100 mg twice daily, clonazepam 0.5 mg at bedtime and melatonin at night.  He called in the interim in September 2022 reporting that the reduced ropinirole was not helping especially with his restless leg symptoms.  He wanted to get on the ropinirole long-acting 6 mg strength.  I provided an interim prescription for the 2 mg strength so he could use up his 4 mg pills.     I saw him on 05/28/2020, at which time he reported that he was taking levodopa 1-1/2 pills 5 times a day but he would also add half pills at times with half a pill of levodopa every 1/2 hour in the first part of the day.  He was advised to continue with Sinemet 1 pill every 2 hours for total of 7 doses.  He was advised to continue with amantadine 100 mg twice daily, clonazepam at night, ropinirole long-acting and melatonin in the evening as well.  They had a recent death in the family, it was unexpected.   He had an interim  appointment with Dr. Rubin Payor in June 2022.    I saw him on 11/23/2019, at which time he felt fairly stable.  He had one fall as he slipped on wet floor carrying groceries but thankfully did not have any injuries.  He voiced concern about his sister who was having trouble with her Parkinson's disease and we discussed this stressor at the time.  He had discontinued Botox injections at [facility] for his blepharospasm.  He was advised to increase his clonazepam to 0.5 mg at bedtime due to increase in dream enactment behavior.   He had an interim appointment with Valentina Shaggy on 03/06/2020.  She recently requested to resume Botox injections at [facility].   He had an  interim appointment with Dr. Westley Hummer at [facility] on 09/20/19, next appointment in September and an appointment with Valentina Shaggy in October.    I saw him on 05/03/2019, at which time he reported a problem with his lower back and right knee.  He had to use a cane for a while after a fall in the summer.  He had physical therapy and also a steroid injection in November 2020.  He was overall stable.  He was advised to follow-up routinely in 6 months and continue with his medications.   He emailed in the interim with concerns regarding his sisters Parkinson's disease and associated delusions and hallucinations.   I saw him on 11/01/2018, at which time he reported more problems with his balance and difficulty with freezing and also difficulty stopping when needed.  He had interim left IPG replacement on 09/23/2018.  He has been receiving Botox injections for his blepharospasm.  He did report a fall and bumped his right hip.  He had an x-ray which was negative for any bony injuries.  He was advised to monitor his hip pain.  He was advised to continue with his Parkinson's medications.    I saw him on 04/28/2018, at which time he reported doing well, had no falls, weight has been stable.  I suggested he keep his medications the same.   I saw him on  10/26/2017, at which time he reported doing okay, tried Gocovri for one month, but it was cost prohibitive. He was back on amantadine 100 mg bid. He tried the reduced dose of amantadine but did not do well on the lower dose. He reported no recent falls. Was getting Botox injections for blepharospasm through Dr. Rubin Payor. He was on Sinemet 1-1/2 pills 5 times a day, long-acting Requip once daily at 5 PM. I asked him to continue with his medication regimen.   I saw him on 04/28/2017, at which time he reported some daytime somnolence. He was supposed to get cataract surgeries and colonoscopy done. He had done well after Botox injections at [facility]. He was on amantadine twice daily and I suggested we reduce it to once daily and consider taking him off altogether. I suggested he take his long-acting Requip 6 mg once daily in the evening, instead of during the day. We kept his Sinemet at 1-1/2 pills 5 times a day.   I saw him on 10/29/2016, at which time he reported doing okay, he had no recent falls, his left eye ptosis improved after adjustment of his DBS. He did not end up having any Botox injections to his eyelid. He was on Sinemet 1-1/2 pills 5 times a day, 3 hourly intervals, starting approximately at 8 AM. He was on amantadine twice daily and Requip XL around 1 PM.    I saw him on 04/09/2016, at which time he was doing okay, he had improvement in his blood first specimen on the left in facial dyskinesias. He had been followed at [facility]. He was started on amantadine and was taking it twice daily. He was off of Stalevo completely. He felt that his abdominal cramping and facial dyskinesias improved after he came off of Stalevo altogether. He was in the process of being scheduled for Botox injections for his blepharospasm at [facility]. He was still on once daily long-acting Requip, when he tried to stop it completely he noticed a flareup of his RLS. He denied any recent falls. He was exercising  regularly. He did have some  constipation but generally under control with some regularity in his bowel movements reported.    I saw him on 07/09/2015, at which time he reported no recent syncope. He had a tilt table test in January 2017 which showed abnormal findings but minimal symptoms reported. He saw Dr. Shawnee Knapp with vascular surgery for consultation on 05/28/2015. He had a 24-hour blood pressure monitor. He had a follow-up appointment pending with his vascular specialist. He also had an appointment pending with Dr. Rubin Payor in October 2017. He felt he pulled a muscle in his upper back on the right. He was on Stalevo about 4 times a day, sometimes 5 times a day depending on his day to day activities. He was on long-acting Requip 6 mg once daily. He was sleeping a little better. He had been advised to start using compression stockings but had not started yet. He had noticed worsening of droopy eyelid. He mailed in December 2017 Re: Blepharospasm and treatment for this. He had been seeing an ophthalmologist.   I saw him on 03/11/2015, at which time he reported a recent fall in September 2016. He had just come back from a road trip and after being in the car for about 2-1/2 hours he got out of the car and fell to the ground for his wife even realize what happened. He fell on pavement and chipped his tooth. They were with some friends who helped. He had to have a crown placed over his chipped tooth. His DBS settings were adjusted when he had his follow-up at [facility]. His orthostatic blood pressure values were stable. Since his settings were changed he had no other syncopal spell. He did have some medication changes as well. He was no longer on clonazepam. Stalevo was 100 mg strength 4-5 times a day. Requip long-acting was 6 mg once daily. She was on long-acting melatonin 6 mg each night. He was advised to drink 4 bottles of water. He was scheduled for tilt table test as well. His wife was concerned  about him climbing on ladders. We talked about gait safety and syncope risk. He was advised not to climb on any ladders or work at heights.    I saw him on 11/07/2014, at which time he reported doing reasonably well. He had nasal congestion after his fall and injury. Of note, unfortunately, he had a syncopal spell on 10/29/2014 and hit his nose as he fell and sustained a nasal fracture. I reviewed the emergency room records. He was referred to ENT. His laceration was repaired with Dermabond. He reported that he stood up and felt lightheaded and then passed out. He fell and hit his face. He had a CT head without contrast as well as maxillofacial CT without contrast on 10/29/2014 which showed: RIGHT and LEFT nasal bone fractures. Small displaced fracture fragment on the LEFT. No skull fracture or intracranial hemorrhage. Unremarkable appearing deep brain stimulator leads. He fell at work. He had taken Levitra at night and 2 days prior he was working hard in the yard. He is notoriously not drinking enough water he admits. He had taken his Stalevo in the morning and was due for his next dose and became lightheaded when he got up quickly from his desk. He had been sitting for about 45 minutes consistently at his desk. He did have a warning sign and kept walking instead of sitting down again. He had a follow-up appointment at [facility] on 11/02/2014 and his Requip was increased slightly again. He  had re-programming of his DBS as well. He had reduced the Requip XL to 3 mg twice daily but this was increased to 4 mg twice daily. His Stalevo was at 4-6 pills daily depending on his work day. He does get sleepy at night around 9:30 and often falls asleep while watching TV. He tried Melatonin long-acting but in combination with clonazepam at night it did not work well and in fact he had insomnia. He tried to reduce his clonazepam to 0.25 mg each night but increased it back to 0.5 mg each night. For his nose fracture he saw  Dr. Jenne Pane and was scheduled for surgery. I did not change his medications. I asked him to drink more water and reminded him to change positions slowly.   I saw him on 08/07/2014, at which time he reported doing quite well after his DBS placement. His wife felt that he was doing great. He was eating well. He was not drinking enough water. He had trace edema in his distal lower extremities. He had 2 appointments for DBS programming. His Stalevo was reduced from 6 pills a day to 4 pills a day but then he increased it back to 5 pills a day. He was off generic Sinemet. He was on Azilect once daily and Requip long-acting 8 mg twice daily.    He's been back to work. He is currently on Azilect once daily, Requip XL 8 mg twice daily, Stalevo 100 mg strength one pill 5 times a day and he has stopped the carbidopa-levodopa. I reviewed his outside records from Dr. Angelyn Punt at [facility] neurosurgery. Patient had bilateral STN DBS electrodes placed on 06/01/2014. He had pulse generators placed approximately 3 weeks after that, 06/13/14.   I saw him on 02/01/2014, at which time he reported difficulty staying asleep. He had not consistently tried melatonin. He had problems with urinary urgency and was advised to try Kegel exercises by his urologist. His PCP talked to an infectious diseases specialist and it was determined he did not actually have Lyme disease.    He did not show for an appointment on 01/30/2014 - he genuinely forgot!   I saw him on 12/19/2013, at which time he reported that his medication was not working as well. He reported more freezing episodes and more off time. Of note, he had been using a new protein milkshake that he was drinking first thing in the morning. He had lost some weight and in an effort to improve his weight loss he had started this new supplements. His primary care physician had referred him to a nutritionist. We also talked about DBS evaluation and he  requested to see another specialist for DBS candidacy. I referred him to Dr. Rubin Payor at [facility] I also advised him to discontinue using the new protein supplement as I felt it may be interfering with the absorption of his Stalevo. I also suggested a trial of Parcopa half a pill 3 times a day at 11, 2 PM and 7 PM and we kept the Stalevo the same. He was advised to use MiraLAX as needed for constipation.   I saw him on 07/31/13, at which time [name], including swimming, yoga, and weights. He has been on Stalevo 100 mg every 2 1/2 hours starting at 7:30 AM for 6 doses. He stopped clonazepam, as he felt some daytime sluggishness. He has been taking C/L 1/2 tid in between. He  had no cognitive issues, but did have some "downtime" midday. I did not change his medications. He had an appointment with Dr. Lorin Picket at [facility] in June, but there was a mix up with his appointment and he does not have an appointment until next year.   In the interim, he was diagnosed with Lyme d/s in July. He was tested and treated by his PCP has has been referred to an infectious d/s doctor. He brings in his latest lying test results from 12/05/2013 which I reviewed: It looks like his IgM antibody status is negative but his IgG antibody status has not quite turn positive as far as I understand the report. That means he is probably in the subacute or early chronic phase if I interpret this correctly. He requested a sooner than scheduled appointment, due to feeling more freezing and more off time. This starts in the late morning hours but can also go into the afternoon after lunch. Cognitively and mood wise he feels stable. He does not sleep well at night. He has not consistently tried melatonin I recommended.   I saw him on 01/30/2013, at which time I felt he was stable. We talked about DBS surgery. I did not make any medication changes. He is aware of the increase risk of melanoma  associated with DAs and his dermatologist is aware that he is taking Requip XL.   I first met him on 09/27/2012, which time I did not make any changes to his medication and did not order any new test as he was stable. He saw Dr. Lorin Picket at [facility] and discussed DBS in the past but was not deemed a cadidate for it at the time as he was doing well. He was told by Dr. Lorin Picket to stay on schedule with his medication. 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 Sleep has been an issue. He goes to sleep well, but wakes up after 45 minutes to 1 hour. He is seeing a urologist. He exercises regularly.   He previously followed with Dr. Avie Echevaria and was last seen by him on 05/25/2012, at which time Dr. Sandria Manly felt he was stable, but because of off time at 2 hours he increased his carbidopa-levodopa by adding half of a 10-100 mg strength 3 times a day to his regimen. He recommended decreasing his Stalevo to 125 mg from 100 mg 6 times a day. He has been on Stalevo 125 mg 6 times a day, at 7:30, 10, 12:30, 3 PM, 5:30 PM and 8 PM. He is on clonazepam 0.5 mg at night, Requip long-acting 6 mg twice daily, rasagiline 1 mg once daily, carbidopa-levodopa 10-100 mg strength half a tablet 3 times a day, 7:30 to 10, then midday and later in the evening.   He was followed by Dr. Sandria Manly since 2008 with a history of Parkinson's disease. He has a history of increased tone and slowness as well as tremor with R sided predominance. He was initially placed on Requip XL and then rasagiline was added in October 2008. In February 2010 Stalevo was added. He had low vitamin D levels in 2008. ANA, ESR, ceruloplasmin were normal except for slightly low ceruloplasmin of 16. Urine copper test was negative. Methylmalonic acid was normal. Urine for heavy metals is normal as well. MRI brain with and without contrast on 01/07/2007 was normal. He had a sleep study in October 2008 showing PLMs and evidence of RBD. Repeat sleep study in September  2011 showed supine obstructive sleep apnea and RBD. He had a CPAP titration study in late September 2011 which was not very successful. He has been on clonazepam at night. He has not noticed any compulsive behaviors, memory loss, depression, swelling, or orthostatic dizziness. He recently had a skin biopsy showing atypical cells, concerning for melanoma.   He works full-time as an Pensions consultant, Air traffic controller. He exercises regularly and does yoga.      His Past Medical History Is Significant For: Past Medical History:  Diagnosis Date   BPH (benign prostatic hyperplasia)    COVID 04/24/2020   scratchy throat x 1 day all symptoms resolved   Parkinson disease    Skin cancer of face    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   Sleep apnea    mild no cpap used   Urethral stricture     His Past Surgical History Is Significant For: Past Surgical History:  Procedure Laterality Date   BURR HOLE W/ STEREOTACTIC INSERTION OF DBS LEADS / INTRAOP MICROELECTRODE RECORDING  04/2014   [facility], new leads replaced in 2020   COLONOSCOPY  2019   CYSTOSCOPY WITH URETHRAL DILATATION N/A 06/15/2019   Procedure: CYSTOSCOPY WITH URETHRAL DILATATION;  Surgeon: Marcine Matar, MD;  Location: [facility];  Service: Urology;  Laterality: N/A;  30 MINS   CYSTOSCOPY WITH URETHRAL DILATATION N/A 05/09/2020   Procedure: CYSTOSCOPY WITH BALLOON DILATATION OF POSTERIOR URETHRAL STRICTURE, FLUOROSCOPIC INTERPRETATION;  Surgeon: Marcine Matar, MD;  Location: [facility];  Service: Urology;  Laterality: N/A;   KNEE SURGERY  yrs ago   exploratory/left   SKIN CANCER EXCISION  01/2014   face   TRANSURETHRAL RESECTION OF PROSTATE     2008    His Family History Is Significant For: Family History  Problem Relation Age of Onset   COPD Mother    Cancer Father        Bladder   Parkinson's disease Sister    Parkinson's disease Sister    Colon cancer Neg Hx    Esophageal  cancer Neg Hx    Stomach cancer Neg Hx     His Social History Is Significant For: Social History   Socioeconomic History   Marital status: Married    Spouse name: Marylene Land   Number of children: 4   Years of education: 19   Highest education level: Not on file  Occupational History   Occupation: LAWYER    Employer: [employer]  Tobacco Use   Smoking status: Never   Smokeless tobacco: Never  Vaping Use   Vaping Use: Never used  Substance and Sexual Activity   Alcohol use: Yes    Alcohol/week: 1.0 - 3.0 standard drink of alcohol    Types: 1 - 3 Glasses of wine per week    Comment: occasional   Drug use: No   Sexual activity: Yes  Other Topics Concern   Not on file  Social History Narrative   Patient is right handed and resides with wife   1-2 cups of coffee a day    Social Determinants of Health   Financial Resource Strain: Not on file  Food Insecurity: Not on file  Transportation Needs: Not on file  Physical Activity: Not on file  Stress: Not on file  Social Connections: Not on file    His Allergies Are:  Allergies  Allergen Reactions   Rosuvastatin     myalgias  :   His Current Medications  Are:  Outpatient Encounter Medications as of 07/29/2022  Medication Sig   acetaminophen (TYLENOL) 325 MG tablet Take 2 tablets (650 mg total) by mouth every 6 (six) hours as needed.   amantadine (SYMMETREL) 100 MG capsule TAKE 1 CAPSULE(100 MG) BY MOUTH TWICE DAILY   carbidopa-levodopa (SINEMET IR) 25-100 MG tablet TAKE 1 AND 1/2 TABLETS BY MOUTH 5 TIMES DAILY.   cholecalciferol (VITAMIN D3) 25 MCG (1000 UNIT) tablet Take 2,000 Units by mouth daily.   clonazePAM (KLONOPIN) 0.5 MG tablet Take 1 tablet (0.5 mg total) by mouth at bedtime.   ibandronate (BONIVA) 150 MG tablet Take 150 mg by mouth every 30 (thirty) days. Take in the morning with a full glass of water, on an empty stomach, and do not take anything else by mouth or lie down for the next 30 min.    Melatonin 5 MG SUBL Place under the tongue.   NEXLETOL 180 MG TABS Take 1 tablet by mouth daily.   Ropinirole HCl 6 MG TB24 TAKE 1 TABLET(6 MG) BY MOUTH DAILY   sildenafil (REVATIO) 20 MG tablet Take 20 mg by mouth as needed.   UNABLE TO FIND Med Name: infusion for cholesterol treatment once a year   atorvastatin (LIPITOR) 10 MG tablet Take 1 tablet (10 mg total) by mouth daily. (Patient not taking: Reported on 07/29/2022)   cephALEXin (KEFLEX) 500 MG capsule Take 1 capsule (500 mg total) by mouth 4 (four) times daily. (Patient not taking: Reported on 07/29/2022)   Coenzyme Q10 (COQ10) 200 MG CAPS Take 200 mg by mouth daily. (Patient not taking: Reported on 07/29/2022)   No facility-administered encounter medications on file as of 07/29/2022.  :  Review of Systems:  Out of a complete 14 point review of systems, all are reviewed and negative with the exception of these symptoms as listed below:  Review of Systems  Neurological:        Patient is here with his wife for a 6 month follow-up. He still reports trouble with dyskinesia after taking Sinemet. He bumps into walls and knocks pictures off the walls. His episodes appear stroke-like and his voice is slurred. He retired 08/03/21. He sleeps later now and ends up missing 1 or 2 doses of the Sinemet.     Objective:  Neurological Exam  Physical Exam Physical Examination:   Vitals:   07/29/22 1321  BP: (!) 145/75  Pulse: (!) 55    General Examination: The patient is a very pleasant 77 y.o. male in no acute distress. He appears well-developed and well-nourished and well groomed.   HEENT: Normocephalic, atraumatic, pupils are equal, round and reactive to light, extraocular tracking is moderately impaired, has no significant blepharospasm or facial grimacing but did end up having facial dyskinesias for several minutes towards the end of the visit. Neck with mild nuchal rigidity.  Airway examination stable, no obvious sialorrhea.  His stimulator  wires are tunneled on the left side.  No difficulty with tongue protrusion.  Chest: is clear to auscultation without wheezing, rhonchi or crackles noted.  Unremarkable generator site.     Heart: sounds are regular and normal without murmurs, rubs or gallops noted.    Abdomen: is soft, non-tender and non-distended.   Extremities: There is no obvious edema in the distal lower extremities bilaterally.   Skin: is warm and dry with no trophic changes noted. Age-related changes are noted on the skin and scars from prior skin cancer removals.    Musculoskeletal: exam reveals no obvious  joint deformities. Upper body tilt to R while sitting.      Neurologically:  Mental status: The patient is awake and alert, paying good attention. He is able to provide his history but details are provided by his wife.  His memory, attention, language and knowledge are appropriate. There is no aphasia, agnosia, apraxia or anomia. There is a mild degree of bradyphrenia. Speech is mild to moderately hypophonic with moderate dysarthria noted at times. Mood is congruent and affect is normal.  Cranial nerves are as described above under HEENT exam. Motor exam: Normal bulk, and strength for age is noted. There are no obvious dyskinesias in the upper or lower extremities noted today.  No obvious resting tremor.  Tone is mild to moderately rigid with presence of cogwheeling in the right upper extremity. There is overall moderate bradykinesia. There is no drift or rebound.  Fine motor skills exam: Fine motor skills are moderately impaired, more noticeable on the right.    Cerebellar testing shows no dysmetria or intention tremor. There is no truncal or gait ataxia.  Sensory exam is intact to light touch throughout.  Gait, station and balance: He stands up from the seated position with mild difficulty and has to push himself up, but requires no assistance. He is noted to lean to the right, posture is moderately stooped, but seems  stable.  He walks with decreased arm swing bilaterally. Mild dystonic posturing of the right arm, also stable. Balance is impaired and he turns with unsteadiness.  No walking aid.    Assessment and Plan:    In summary, Ronald Noble is a 77 year old male with an underlying medical history of skin cancer including melanoma, enlarged prostate (s/p TURP), who presents for followup consultation of his R sided predominant Parkinson's disease, complicated by RBD and sleep disturbance as well as dyskinesias, particularly facial dyskinesias.   He had bilateral DBS placements at [facility], s/p L IPG replacement on the L in 09/2018 and in November 2022. He is On Sinemet, Requip long-acting and amantadine. He has had orthostatic hypotension, syncopal spells, falls with injuries, facial dyskinesias, blepharospasm, constipation and bladder hyperactivity. He had b/l DBS placements at [facility] on 06/01/14 and generator placement on 06/13/2014. Left-sided battery replacement/generator replacement in June 2020 and again on 02/12/21. 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  His facial dyskinesias have been progressively worse.  He was looking into getting a U step walker but did not end up getting it.  He retired in April 2023 and is no longer driving due to his blepharospasm and facial dyskinesias which affect his vision.  We had talked about tapering off his amantadine in the past.  We tried to get him the long-acting amantadine but it was cost prohibitive.  Today, I suggested he reduce his levodopa in the hope that his facial dyskinesias will reduce to 1 pill 3 times a day, at 8, 12 and 4 PM, take amantadine at 8 AM and 4 PM daily, and take ropinirole between 5 and 7 PM daily.   He is advised to follow-up in 6 months, sooner if needed.  He has regular appointments at [facility] neurology as well.  I answered all their questions today and the patient and his wife were in agreement. I spent 30 minutes in total face-to-face time and in reviewing records  during pre-charting, more than 50% of which was spent in counseling and coordination of care, reviewing test results, reviewing medications and treatment regimen and/or in discussing or reviewing the diagnosis of PD, the prognosis and treatment options. Pertinent laboratory and imaging test results that were available during this visit with the patient were reviewed by me and considered in my medical decision making (see chart for details).

## 2022-07-30 DIAGNOSIS — E559 Vitamin D deficiency, unspecified: Secondary | ICD-10-CM | POA: Diagnosis not present

## 2022-07-30 DIAGNOSIS — Z1212 Encounter for screening for malignant neoplasm of rectum: Secondary | ICD-10-CM | POA: Diagnosis not present

## 2022-07-30 DIAGNOSIS — N529 Male erectile dysfunction, unspecified: Secondary | ICD-10-CM | POA: Diagnosis not present

## 2022-07-30 DIAGNOSIS — Z125 Encounter for screening for malignant neoplasm of prostate: Secondary | ICD-10-CM | POA: Diagnosis not present

## 2022-07-30 DIAGNOSIS — R7989 Other specified abnormal findings of blood chemistry: Secondary | ICD-10-CM | POA: Diagnosis not present

## 2022-07-30 DIAGNOSIS — E785 Hyperlipidemia, unspecified: Secondary | ICD-10-CM | POA: Diagnosis not present

## 2022-07-30 DIAGNOSIS — M8580 Other specified disorders of bone density and structure, unspecified site: Secondary | ICD-10-CM | POA: Diagnosis not present

## 2022-07-30 DIAGNOSIS — R634 Abnormal weight loss: Secondary | ICD-10-CM | POA: Diagnosis not present

## 2022-07-31 DIAGNOSIS — R82998 Other abnormal findings in urine: Secondary | ICD-10-CM | POA: Diagnosis not present

## 2022-08-05 DIAGNOSIS — C801 Malignant (primary) neoplasm, unspecified: Secondary | ICD-10-CM | POA: Diagnosis not present

## 2022-08-05 DIAGNOSIS — E785 Hyperlipidemia, unspecified: Secondary | ICD-10-CM | POA: Diagnosis not present

## 2022-08-05 DIAGNOSIS — A6920 Lyme disease, unspecified: Secondary | ICD-10-CM | POA: Diagnosis not present

## 2022-08-05 DIAGNOSIS — I2510 Atherosclerotic heart disease of native coronary artery without angina pectoris: Secondary | ICD-10-CM | POA: Diagnosis not present

## 2022-08-05 DIAGNOSIS — G4733 Obstructive sleep apnea (adult) (pediatric): Secondary | ICD-10-CM | POA: Diagnosis not present

## 2022-08-05 DIAGNOSIS — M25551 Pain in right hip: Secondary | ICD-10-CM | POA: Diagnosis not present

## 2022-08-05 DIAGNOSIS — Z0000 Encounter for general adult medical examination without abnormal findings: Secondary | ICD-10-CM | POA: Diagnosis not present

## 2022-08-05 DIAGNOSIS — H9190 Unspecified hearing loss, unspecified ear: Secondary | ICD-10-CM | POA: Diagnosis not present

## 2022-08-05 DIAGNOSIS — G20A1 Parkinson's disease without dyskinesia, without mention of fluctuations: Secondary | ICD-10-CM | POA: Diagnosis not present

## 2022-08-05 DIAGNOSIS — D696 Thrombocytopenia, unspecified: Secondary | ICD-10-CM | POA: Diagnosis not present

## 2022-08-05 DIAGNOSIS — E559 Vitamin D deficiency, unspecified: Secondary | ICD-10-CM | POA: Diagnosis not present

## 2022-08-05 DIAGNOSIS — R55 Syncope and collapse: Secondary | ICD-10-CM | POA: Diagnosis not present

## 2022-08-10 NOTE — Therapy (Signed)
[facility] [address] [place], [place], [address] Phone: [phone]   Fax:  [phone]  Patient Details  Name: Ronald Noble MRN: [id number] Date of Birth: 03/08/1946 Referring Provider:  Rodrigo Ran, MD  Encounter Date: 08/13/2022  Occupational Therapy Parkinson's Disease Screen  Hand dominance:  Rt handed   Fastening/unfastening 3 buttons in:  > 2 min.  9-hole peg test:    RUE  35.12 sec        LUE  38.84 sec  Box & Blocks Test:   RUE  44 blocks        LUE  43 blocks   Change in ability to perform ADLs/IADLs:  Pt reports ups/downs which can affect dressing (buttons)   Other Comments:  Handwriting legible with no decrease in size BUE AROM WNL's. Leans to Rt side seated and standing.    Pt would benefit from occupational therapy evaluation due to decreased coordination with buttons, decreased Box & Blocks test bilaterally, decreased safety/balance, and posture.  Pt wishes to only come 1x/wk - anticipate 1x/wk for 6 to 8 weeks    Sheran Lawless, OT 08/10/2022, 6:04 PM  [facility] [address] [place], [place], [address] Phone: [phone]   Fax:  [phone]

## 2022-08-11 ENCOUNTER — Other Ambulatory Visit: Payer: Self-pay | Admitting: *Deleted

## 2022-08-11 MED ORDER — CLONAZEPAM 0.5 MG PO TABS: 0.5000 mg | ORAL_TABLET | Freq: Every day | ORAL | 2 refills | Status: DC

## 2022-08-13 ENCOUNTER — Ambulatory Visit: Payer: Medicare HMO | Attending: Internal Medicine | Admitting: Physical Therapy

## 2022-08-13 ENCOUNTER — Telehealth: Payer: Self-pay | Admitting: Physical Therapy

## 2022-08-13 ENCOUNTER — Ambulatory Visit: Payer: Medicare HMO | Admitting: Occupational Therapy

## 2022-08-13 ENCOUNTER — Ambulatory Visit: Payer: Medicare HMO | Admitting: Speech Pathology

## 2022-08-13 DIAGNOSIS — R293 Abnormal posture: Secondary | ICD-10-CM

## 2022-08-13 DIAGNOSIS — R471 Dysarthria and anarthria: Secondary | ICD-10-CM | POA: Insufficient documentation

## 2022-08-13 DIAGNOSIS — R278 Other lack of coordination: Secondary | ICD-10-CM

## 2022-08-13 DIAGNOSIS — R41841 Cognitive communication deficit: Secondary | ICD-10-CM

## 2022-08-13 DIAGNOSIS — G20B2 Parkinson's disease with dyskinesia, with fluctuations: Secondary | ICD-10-CM

## 2022-08-13 NOTE — Therapy (Signed)
[facility] [address] [place], [place], [address] Phone: [phone]   Fax:  [phone]  Patient Details  Name: Ronald Noble MRN: [id number] Date of Birth: 10/23/1945 Referring Provider:  Rodrigo Ran, MD  Encounter Date: 08/13/2022  Physical Therapy Parkinson's Disease Screen   Timed Up and Go test:7.5 seconds   10 meter walk test:12.2 seconds with SPC = 2.69 ft/sec  5 time sit to stand test: 14.72 seconds wit no UE support   Reports biggest thing is his dyskinesias. Has had a few falls from moving too fast. Can catch himself most of the time. A lot of the time is not looking where he is going. Has not been doing as well with his exercise routine, started back a few days ago.   Patient would benefit from Physical Therapy evaluation due to hx of recent falls and imbalance.     Drake Leach, PT, DPT  08/13/2022, 8:07 AM  [facility] [address] [place], [place], [address] Phone: [phone]   Fax:  [phone]

## 2022-08-13 NOTE — Telephone Encounter (Signed)
Dr. Frances Furbish, Otis Brace. Mastandrea was seen for PD screens on 08/13/22. The patient would benefit from a PT evaluation for imbalance/hx of falls, OT evaluation for impaired coordination, and ST for decr cognition.   If you agree, please place an order in [facility] workque in [facility] or fax the order to [phone]. Thank you, Sherlie Ban, PT, DPT 08/13/22 9:04 AM    [facility] [address] [place], [place]  [address] Phone:  [phone] Fax:  [phone]

## 2022-08-13 NOTE — Telephone Encounter (Signed)
Referral to neurorehab placed as recommended.

## 2022-08-13 NOTE — Therapy (Signed)
[facility] [address] [place], [place], [address] Phone: [phone]   Fax:  [phone]  Patient Details  Name: Ronald Noble MRN: [id number] Date of Birth: Feb 21, 1946 Referring Provider:  Rodrigo Ran, MD  Encounter Date: 08/13/2022  Speech Therapy Parkinson's Disease Screen   Decibel Level today: high 60s dB  (WNL=70-72 dB) with sound level meter 30cm away from pt's masked mouth. Pt's conversational volume has maintained consistent since last treatment course. However, pt reporting usual requests for repetition from spouse which is worse in evenings  Pt does not report difficulty with swallowing, which does not warrant further evaluation  Pt reports challenges with memory  that he would like to address in ST  Pt would benefit from speech-language eval for dysarthria and cognition- please order via [facility] or call [phone] to schedule   Maia Breslow, CCC-SLP 08/13/2022, 8:29 AM  [facility] [address] [place], [place], [address] Phone: [phone]   Fax:  [phone]

## 2022-08-13 NOTE — Addendum Note (Signed)
Addended by: Huston Foley on: 08/13/2022 09:08 AM   Modules accepted: Orders

## 2022-08-21 DIAGNOSIS — N390 Urinary tract infection, site not specified: Secondary | ICD-10-CM | POA: Diagnosis not present

## 2022-09-02 DIAGNOSIS — Z9682 Presence of neurostimulator: Secondary | ICD-10-CM | POA: Diagnosis not present

## 2022-09-02 DIAGNOSIS — G245 Blepharospasm: Secondary | ICD-10-CM | POA: Diagnosis not present

## 2022-09-02 DIAGNOSIS — Z79899 Other long term (current) drug therapy: Secondary | ICD-10-CM | POA: Diagnosis not present

## 2022-09-02 DIAGNOSIS — Z9689 Presence of other specified functional implants: Secondary | ICD-10-CM | POA: Diagnosis not present

## 2022-09-02 DIAGNOSIS — K117 Disturbances of salivary secretion: Secondary | ICD-10-CM | POA: Diagnosis not present

## 2022-09-02 DIAGNOSIS — G20A1 Parkinson's disease without dyskinesia, without mention of fluctuations: Secondary | ICD-10-CM | POA: Diagnosis not present

## 2022-09-02 DIAGNOSIS — G20B1 Parkinson's disease with dyskinesia, without mention of fluctuations: Secondary | ICD-10-CM | POA: Diagnosis not present

## 2022-09-04 ENCOUNTER — Ambulatory Visit (HOSPITAL_BASED_OUTPATIENT_CLINIC_OR_DEPARTMENT_OTHER): Payer: Medicare HMO | Admitting: Student

## 2022-09-04 ENCOUNTER — Other Ambulatory Visit: Payer: Self-pay | Admitting: Neurology

## 2022-09-09 DIAGNOSIS — L565 Disseminated superficial actinic porokeratosis (DSAP): Secondary | ICD-10-CM | POA: Diagnosis not present

## 2022-09-09 NOTE — Therapy (Deleted)
OUTPATIENT OCCUPATIONAL THERAPY PARKINSON'S EVALUATION  Patient Name: Ronald Noble MRN: [id number] DOB:12/26/1945, 77 y.o., male Today's Date: 09/10/2022  PCP: Perini, Mark, MD.  REFERRING PROVIDER: Athar, Saima, MD  END OF SESSION:  OT End of Session - 09/10/22 1255     Visit Number 1    Number of Visits 7    Authorization Type [employer]    OT Start Time 0932    OT Stop Time 1015    OT Time Calculation (min) 43 min    Activity Tolerance Patient tolerated treatment well    Behavior During Therapy WFL for tasks assessed/performed             Past Medical History:  Diagnosis Date   BPH (benign prostatic hyperplasia)    COVID 04/24/2020   scratchy throat x 1 day all symptoms resolved   Parkinson disease    Skin cancer of face    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   Sleep apnea    mild no cpap used   Urethral stricture    Past Surgical History:  Procedure Laterality Date   BURR HOLE W/ STEREOTACTIC INSERTION OF DBS LEADS / INTRAOP MICROELECTRODE RECORDING  04/2014   [facility], [facility] in 2020   COLONOSCOPY  2019   CYSTOSCOPY WITH URETHRAL DILATATION N/A 06/15/2019   Procedure: CYSTOSCOPY WITH URETHRAL DILATATION;  Surgeon: Dahlstedt, Stephen, MD;  Location: [facility];  Service: Urology;  Laterality: N/A;  30 MINS   CYSTOSCOPY WITH URETHRAL DILATATION N/A 05/09/2020   Procedure: CYSTOSCOPY WITH BALLOON DILATATION OF POSTERIOR URETHRAL STRICTURE, FLUOROSCOPIC INTERPRETATION;  Surgeon: Dahlstedt, Stephen, MD;  Location: [facility];  Service: Urology;  Laterality: N/A;   KNEE SURGERY  yrs ago   exploratory/left   SKIN CANCER EXCISION  01/2014   face   TRANSURETHRAL RESECTION OF PROSTATE     2008   Patient Active Problem List   Diagnosis Date Noted   Osteoarthritis cervical spine 04/01/2022   Rib pain on right side 12/10/2020   Hyperlipidemia 10/11/2020   Bilateral olecranon bursitis 07/05/2019   Other  bursitis of knee, left knee 12/06/2018   Pain in right leg 12/06/2018   Parkinson disease     ONSET DATE: 08/13/2022 (referral date)  REFERRING DIAG: G20.A1 (ICD-10-CM) - Parkinson disease  THERAPY DIAG:  Other lack of coordination  Abnormal posture  Unsteadiness on feet  Other symptoms and signs involving the nervous system  Rationale for Evaluation and Treatment: Rehabilitation  SUBJECTIVE:   SUBJECTIVE STATEMENT: Pt reports his Parkinson's disease effects more his right side. Pt accompanied by: self  PERTINENT HISTORY: skin cancer including melanoma, enlarged prostate (s/p TURP), who presents for followup consultation of his R sided predominant Parkinson's disease, complicated by blepharospasm (for which he receives Xeomin inj. At [facility]), sialorrhea, RBD and sleep disturbance as well as dyskinesias, s/p bilateral DBS placements at [facility], s/p L IPG replacement on the L in 09/2018 and L IPG replacement under Dr. Tatter at [facility] on 02/12/2021  PRECAUTIONS: Fall, pt does not drive (family decision) but still has license  WEIGHT BEARING RESTRICTIONS: No  PAIN:  Are you having pain? No  FALLS: Has patient fallen in last 6 months? Yes. Number of falls "some tumbles, no hospitalizations"  LIVING ENVIRONMENT: Lives with: lives with their spouse Lives in: House/apartment - two level, can reside on main floor Stairs: Yes: Internal: Flight steps; on right going up and External: 2 at front and 3 from   carport steps; on left going up Has following equipment at home: Single point cane and Walker - 2 wheeled Walk-in shower with built-in shower seat, no grab bars. Standard height toilet  PLOF: Grossly independent  PATIENT GOALS: Increase mobility and flexibility, get better routine for exercise, improve hand dexterity, manipulate things better, improve posture (leaning forward causes to stumble)  OBJECTIVE:   HAND DOMINANCE: Right  ADLs:  Eating: Independent, denies  frequent spilling Grooming: Independent UB Dressing: Independent, occasional assist with buttons when rushed LB Dressing: Independent, occasional assist with buttons of pants when rushed Toileting: Independent, occasional issue with urge incontinence. Pt being followed by urology Bathing: Independent Tub Shower transfers: Independent, has gripper mat but does not use Equipment: none  IADLs: Shopping: Family manages Light housekeeping: Independent Meal Prep: Pt fixes his own lunch and breakfast, no cutting or anything involved with these Community mobility: Pt reports limitation of distance to 1/2 to 1 mile distance. With fatigue, pt notices more that forward lean which impacts his balance Medication management: Independent, keeps log of medications Financial management: Independent Handwriting: 50% legible and Moderate micrographia  MOBILITY STATUS: Freezing, difficulty with turns, start hesitation, and All depends on medication cycle per patient  POSTURE COMMENTS:  rounded shoulders and forward head  ACTIVITY TOLERANCE: Activity tolerance: Fatigue after 1/2 to 1 mile  FUNCTIONAL OUTCOME MEASURES: Fastening/unfastening 3 buttons: 3 minutes to button 1 button Physical performance test: PPT#2 (simulated eating) 12 secs right hand and 47 secs (pt reports using both hands to eat, but more right-handed) & PPT#4 (donning/doffing jacket): 37 seconds with hospital gown  COORDINATION: 9 Hole Peg test: Right: TBD sec; Left: TBD sec Box and Blocks:  Right 48 blocks, Left 41 blocks  UE ROM:  WFL  UE MMT:   WFL  SENSATION: WFL  MUSCLE TONE: RUE: Within functional limits and LUE: Modifed Ashworth Scale 1 = Slight increase in muscle tone, manifested by a catch and release or by minimal resistance at the end of the range of motion when the affected part(s) is moved in flexion or extension  COGNITION: Overall cognitive status: Within functional limits for tasks assessed for today's  assessment  OBSERVATIONS: Dyskinesias   TODAY'S TREATMENT:                                                                                                                              DATE: N/A    PATIENT EDUCATION: Education details: OT POC Person educated: Patient Education method: Explanation Education comprehension: verbalized understanding  HOME EXERCISE PROGRAM: N/A  GOALS: Goals reviewed with patient? Yes  SHORT TERM GOALS: Target date: 10/01/2022    Pt will be independent with PD specific HEP. Baseline: not yet initiated Goal status: INITIAL  2.  Pt will verbalize understanding of adapted strategies to maximize safety and independence with ADLs/IADLs. Baseline: not yet initiated Goal status: INITIAL  3.  Pt will demonstrate improved fine motor coordination for ADLs as evidenced by decreasing   9 hole peg test score TBD when assessment completed. Baseline: Will assess Goal status: INITIAL  4. Pt will verbalize understanding of ways to keep thinking skills sharp and ways to compensate for STM changes in the future. Baseline: Not initiated Goal status: INITIAL    LONG TERM GOALS: Target date: 11/05/2022   Pt will verbalize understanding of ways to prevent future PD related complications and PD community resources. Baseline: Not initiated Goal status: INITIAL  2.  Pt will write a short paragraph with no significant decrease in size and maintain 100% legibility. Baseline: 50% legible, moderate micrographia Goal status: INITIAL  3.  Pt will demonstrate improved ease with feeding as evidenced by decreasing PPT#2 by 2 secs for the right, and by 10 secs for the left. Baseline: Rt - 12 secs; Lt - 47 secs Goal status: INITIAL  4.  Pt will demonstrate increased ease with dressing as evidenced by decreasing PPT#4 (don/ doff jacket) to 25 secs or less. Baseline: 37 seconds Goal status: INITIAL  5.  Pt will demonstrate improved ease with fastening buttons as evidenced  by decreasing 3 button/unbutton time by 120 seconds Baseline: Stopped assessment at 3 minutes with pt only able to button 1 button Goal status: INITIAL  6.  Pt will be able to place at least 50 blocks using right hand with completion of Box and Blocks test. Baseline: 48 blocks Goal status: INITIAL  ASSESSMENT:  CLINICAL IMPRESSION: Patient is a 76 y.o. right-handed male who was seen today for occupational therapy evaluation for Parkinson's disease. Pt with hx of skin cancer including melanoma, enlarged prostate (s/p TURP), and s/p bilateral DBS placements at [facility]. Denies tremors of BUE, but does report drawing of face and difficulty seeing sometimes. Pt desiring to improve dexterity, balance, and fine motor coordination. Pt would benefit from skilled occupational therapy services to address patient-centered goals, address performance deficits as listed below, and to maximize independence in daily occupations.  PERFORMANCE DEFICITS: in functional skills including ADLs, coordination, dexterity, tone, flexibility, Fine motor control, Gross motor control, mobility, balance, body mechanics, endurance, continence, and UE functional use, cognitive skills including memory, and psychosocial skills including habits, interpersonal interactions, and routines and behaviors.   IMPAIRMENTS: are limiting patient from ADLs, IADLs, leisure, and social participation.   COMORBIDITIES:  may have co-morbidities  that affects occupational performance. Patient will benefit from skilled OT to address above impairments and improve overall function.  MODIFICATION OR ASSISTANCE TO COMPLETE EVALUATION: No modification of tasks or assist necessary to complete an evaluation.  OT OCCUPATIONAL PROFILE AND HISTORY: Detailed assessment: Review of records and additional review of physical, cognitive, psychosocial history related to current functional performance.  CLINICAL DECISION MAKING: LOW - limited treatment options, no  task modification necessary  REHAB POTENTIAL: Good  EVALUATION COMPLEXITY: Low    PLAN:  OT FREQUENCY: 1x/week  OT DURATION: 8 weeks  PLANNED INTERVENTIONS: self care/ADL training, therapeutic exercise, therapeutic activity, neuromuscular re-education, manual therapy, passive range of motion, balance training, functional mobility training, electrical stimulation, ultrasound, paraffin, fluidotherapy, moist heat, cryotherapy, contrast bath, patient/family education, cognitive remediation/compensation, visual/perceptual remediation/compensation, psychosocial skills training, energy conservation, coping strategies training, DME and/or AE instructions, and Re-evaluation  RECOMMENDED OTHER SERVICES: Evaluated by PT and SLP  CONSULTED AND AGREED WITH PLAN OF CARE: Patient  PLAN FOR NEXT SESSION: PWR! movements   Adhrit Krenz M Terica Yogi, OT 09/10/2022, 12:56 PM  

## 2022-09-10 ENCOUNTER — Ambulatory Visit: Payer: Medicare HMO | Admitting: Speech Pathology

## 2022-09-10 ENCOUNTER — Ambulatory Visit: Payer: Self-pay

## 2022-09-10 ENCOUNTER — Ambulatory Visit: Payer: Medicare HMO | Attending: Internal Medicine | Admitting: Physical Therapy

## 2022-09-10 ENCOUNTER — Other Ambulatory Visit: Payer: Self-pay

## 2022-09-10 ENCOUNTER — Other Ambulatory Visit: Payer: Self-pay | Admitting: Neurology

## 2022-09-10 ENCOUNTER — Ambulatory Visit: Payer: Medicare HMO

## 2022-09-10 DIAGNOSIS — R29818 Other symptoms and signs involving the nervous system: Secondary | ICD-10-CM | POA: Insufficient documentation

## 2022-09-10 DIAGNOSIS — R2681 Unsteadiness on feet: Secondary | ICD-10-CM

## 2022-09-10 DIAGNOSIS — R471 Dysarthria and anarthria: Secondary | ICD-10-CM | POA: Insufficient documentation

## 2022-09-10 DIAGNOSIS — R2689 Other abnormalities of gait and mobility: Secondary | ICD-10-CM | POA: Insufficient documentation

## 2022-09-10 DIAGNOSIS — R278 Other lack of coordination: Secondary | ICD-10-CM

## 2022-09-10 DIAGNOSIS — R41841 Cognitive communication deficit: Secondary | ICD-10-CM | POA: Diagnosis not present

## 2022-09-10 DIAGNOSIS — R293 Abnormal posture: Secondary | ICD-10-CM
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
## 2022-09-10 NOTE — Therapy (Signed)
OUTPATIENT SPEECH LANGUAGE PATHOLOGY PARKINSON'S EVALUATION   Patient Name: Ronald Noble MRN: [id number] DOB:Sep 12, 1945, 77 y.o., male Today's Date: 09/10/2022  PCP: Rodrigo Ran, MD  REFERRING PROVIDER: Huston Foley, MD  END OF SESSION:  End of Session - 09/10/22 1149     Visit Number 1    Number of Visits 7    Date for SLP Re-Evaluation 11/05/22    Authorization Type [employer]    Progress Note Due on Visit 10    SLP Start Time 1050    SLP Stop Time  1135    SLP Time Calculation (min) 45 min    Activity Tolerance Patient tolerated treatment well             Past Medical History:  Diagnosis Date   BPH (benign prostatic hyperplasia)    COVID 04/24/2020   scratchy throat x 1 day all symptoms resolved   Parkinson disease    Skin cancer of face    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   Sleep apnea    mild no cpap used   Urethral stricture    Past Surgical History:  Procedure Laterality Date   BURR HOLE W/ STEREOTACTIC INSERTION OF DBS LEADS / INTRAOP MICROELECTRODE RECORDING  04/2014   [facility] in [facility]   COLONOSCOPY  2019   CYSTOSCOPY WITH URETHRAL DILATATION N/A 06/15/2019   Procedure: CYSTOSCOPY WITH URETHRAL DILATATION;  Surgeon: Marcine Matar, MD;  Location: [facility];  Service: Urology;  Laterality: N/A;  30 MINS   CYSTOSCOPY WITH URETHRAL DILATATION N/A 05/09/2020   Procedure: CYSTOSCOPY WITH BALLOON DILATATION OF POSTERIOR URETHRAL STRICTURE, FLUOROSCOPIC INTERPRETATION;  Surgeon: Marcine Matar, MD;  Location: [facility];  Service: Urology;  Laterality: N/A;   KNEE SURGERY  yrs ago   exploratory/left   SKIN CANCER EXCISION  01/2014   face   TRANSURETHRAL RESECTION OF PROSTATE     2008   Patient Active Problem List   Diagnosis Date Noted   Osteoarthritis cervical spine 04/01/2022   Rib pain on right side 12/10/2020   Hyperlipidemia 10/11/2020   Bilateral olecranon bursitis  07/05/2019   Other bursitis of knee, left knee 12/06/2018   Pain in right leg 12/06/2018   Parkinson disease     ONSET DATE: 08/13/22  REFERRING DIAG: G20.A1 (ICD-10-CM) - Parkinson disease   THERAPY DIAG:  Cognitive communication deficit  Dysarthria and anarthria  Rationale for Evaluation and Treatment: Rehabilitation  SUBJECTIVE:   SUBJECTIVE STATEMENT: "Doing well, hope you are." Pt accompanied by: self  PERTINENT HISTORY: Screening 08/13/22: Decibel Level today: high 60s dB  (WNL=70-72 dB) with sound level meter 30cm away from pt's masked mouth. Pt's conversational volume has maintained consistent since last treatment course. However, pt reporting usual requests for repetition from spouse which is worse in evenings. Pt does not report difficulty with swallowing, which does not warrant further evaluation. Pt reports challenges with memory  that he would like to address in ST  PAIN:  Are you having pain?  No overt s/sx  FALLS: Has patient fallen in last 6 months?  No  LIVING ENVIRONMENT: Lives with: lives with their family Lives in: House/apartment  PLOF:  Level of assistance: Independent with ADLs, Independent with IADLs Employment: Retired  PATIENT GOALS: "get better at remembering details."   OBJECTIVE:   DIAGNOSTIC FINDINGS: Patient presents with a mild memory deficit, which has been self reported to often interfere with his daily life. Vocal volume is low at  65 dB, even though patient demonstrates cognitive awareness of deficit.   COGNITION: Overall cognitive status: Within functional limits for tasks assessed Areas of impairment: Memory Comments: Patient notes forgetting details in conversations, and difficulty with medication management,    MOTOR SPEECH: assessed across variety of speech tasks: reading, word repetition, generative discourse sample Overall motor speech: impaired Level of impairment: Word Rate of Speech: Variable Dysfluencies: none  evidenced Phonation: low vocal intensity Voice Quality: hoarse and rough Respiration: thoracic breathing Word and Phrasal Stress: WFL Resonance: WFL Articulation: Impaired: imprecise Intelligibility: Intelligibility reduced, ~85% intelligible  Motor planning: Appears intact Interfering components: premorbid status Effective technique: slow rate, increased vocal intensity, over articulate, and pacing Comments: Patient received ST in the past to address voice, and has experience with Speak Out! Program and additional voice therapy. He is able to teach back lessons, but states that he has not been intentional with this practice while he has been home.   OBJECTIVE VOICE ASSESSMENT: Conversational loudness average: 63 dB Conversational loudness range: 61-68 dB  Pt does not report difficulty with swallowing which does not warrant further evaluation.  STANDARDIZED ASSESSMENT SLUMS: 26/30, WNL 27+   PATIENT REPORTED OUTCOME MEASURES (PROM): MMQ-shows difficulty often with managing medication, appointments, names, and numbers. When asked, patient will vocalize compensatory strategies for these memory mistakes, however he still rates them as happening "often."  TODAY'S TREATMENT:                                                                                                                                         DATE:   09/10/22: Patient able to teach back previous strategies of speaking with intent, and to "shout slowly" also known as "dog voice" which is slow, loud, and with intent. He states that he has not been practicing his speech at home. Pt states problems with memory, as he struggles with recounting a story, and it's details after it is told to him. Patient states that he creates associations with people/numbers in order to remember them. Education given on using executive functioning to choose what is worth remembering, and told to continue Speak Out program at home.   PATIENT  EDUCATION: Education details: See above  Person educated: Patient Education method: Explanation Education comprehension: verbalized understanding  HOME EXERCISE PROGRAM: Speak Out!   GOALS: Goals reviewed with patient? Yes  SHORT TERM GOALS: Target date: 10/08/2022  Patient will correct speech with rare min-A to increase vocal volume, averaging WNL (70+ dB) within 5 minute structured conversation.  Baseline: Goal status: INITIAL  2.  Patient will implement Speak Out Lessons independently 2-3x week.  Baseline:  Goal status: INITIAL  3.  Pt will recall novel information during structured practice with 80% accuracy using self-selected memory strategy Baseline:  Goal status: INITIAL   LONG TERM GOALS: Target date: 10/22/2022  Patient will report decrease on MMQ for memory mistakes.  Baseline:  Goal status: INITIAL  2.  Patient will self-correct speech with rare min-A to increase vocal volume, averaging WNL (70+ dB) within 10 minute unstructured conversation.  Baseline:  Goal status: INITIAL  3.  Pt will verbalize appropriate memory strategy to accommodate functional memory challenges with 100% accuracy  Baseline:  Goal status: INITIAL   ASSESSMENT:  CLINICAL IMPRESSION: Patient is a 77 y.o. M who was seen today for Parkinson's evaluation. Patient demonstrated impaired memory as seen in recalling details in conversation, challenges in medication & schedule management. SLUMS administration supports mild cognitive deficits. Pt demonstrates attempted use of memory strategies throughout which appeared to improve his performance. Mild dysarthria c/w PD with low vocal intensity, rushes in speech, and reduced amplitude of articulators resulting in imprecise articulation. Vocal volume averages 63 dB over evaluation session. Reduced intelligibility resulting. Pt with strategies to aid in improved speech but evidencing challenges in carryover of said strategies. Pt would benefit from ST  to address dysarthria and memory concerns to maximize independence, reduce caregiver burden, and improve communication efficacy.   OBJECTIVE IMPAIRMENTS: Objective impairments include memory and dysarthria. These impairments are limiting patient from ADLs/IADLs and effectively communicating at home and in community.Factors affecting potential to achieve goals and functional outcome are severity of impairments.. Patient will benefit from skilled SLP services to address above impairments and improve overall function.  REHAB POTENTIAL: Good  PLAN:  SLP FREQUENCY: 1x/week  SLP DURATION: 6 weeks  PLANNED INTERVENTIONS: Internal/external aids, Functional tasks, SLP instruction and feedback, Compensatory strategies, and Patient/family education   [place], Student-SLP 09/10/2022, 12:16 PM

## 2022-09-10 NOTE — Therapy (Signed)
OUTPATIENT OCCUPATIONAL THERAPY PARKINSON'S EVALUATION  Patient Name: Ronald Noble MRN: [id number] DOB:Jul 26, 1945, 77 y.o., male Today's Date: 09/10/2022  PCP: Rodrigo Ran, MD.  REFERRING PROVIDER: Huston Foley, MD  END OF SESSION:  OT End of Session - 09/10/22 1255     Visit Number 1    Number of Visits 7    Authorization Type [employer]    OT Start Time 0932    OT Stop Time 1015    OT Time Calculation (min) 43 min    Activity Tolerance Patient tolerated treatment well    Behavior During Therapy WFL for tasks assessed/performed             Past Medical History:  Diagnosis Date   BPH (benign prostatic hyperplasia)    COVID 04/24/2020   scratchy throat x 1 day all symptoms resolved   Parkinson disease    Skin cancer of face    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   Sleep apnea    mild no cpap used   Urethral stricture    Past Surgical History:  Procedure Laterality Date   BURR HOLE W/ STEREOTACTIC INSERTION OF DBS LEADS / INTRAOP MICROELECTRODE RECORDING  04/2014   [facility] in 2020   COLONOSCOPY  2019   CYSTOSCOPY WITH URETHRAL DILATATION N/A 06/15/2019   Procedure: CYSTOSCOPY WITH URETHRAL DILATATION;  Surgeon: Marcine Matar, MD;  Location: [facility];  Service: Urology;  Laterality: N/A;  30 MINS   CYSTOSCOPY WITH URETHRAL DILATATION N/A 05/09/2020   Procedure: CYSTOSCOPY WITH BALLOON DILATATION OF POSTERIOR URETHRAL STRICTURE, FLUOROSCOPIC INTERPRETATION;  Surgeon: Marcine Matar, MD;  Location: [facility];  Service: Urology;  Laterality: N/A;   KNEE SURGERY  yrs ago   exploratory/left   SKIN CANCER EXCISION  01/2014   face   TRANSURETHRAL RESECTION OF PROSTATE     2008   Patient Active Problem List   Diagnosis Date Noted   Osteoarthritis cervical spine 04/01/2022   Rib pain on right side 12/10/2020   Hyperlipidemia 10/11/2020   Bilateral olecranon bursitis 07/05/2019   Other  bursitis of knee, left knee 12/06/2018   Pain in right leg 12/06/2018   Parkinson disease     ONSET DATE: 08/13/2022 (referral date)  REFERRING DIAG: G20.A1 (ICD-10-CM) - Parkinson disease  THERAPY DIAG:  Other lack of coordination  Abnormal posture  Unsteadiness on feet  Other symptoms and signs involving the nervous system  Rationale for Evaluation and Treatment: Rehabilitation  SUBJECTIVE:   SUBJECTIVE STATEMENT: Pt reports his Parkinson's disease effects more his right side. Pt accompanied by: self  PERTINENT HISTORY: skin cancer including melanoma, enlarged prostate (s/p TURP), who presents for followup consultation of his R sided predominant Parkinson's disease, complicated by blepharospasm (for which he receives Xeomin inj. At [facility]), sialorrhea, RBD and sleep disturbance as well as dyskinesias, s/p bilateral DBS placements at [facility], s/p L IPG replacement on the L in 09/2018 and L IPG replacement under Dr. Angelyn Punt at [facility] on 02/12/2021  PRECAUTIONS: Fall, pt does not drive (family decision) but still has license  WEIGHT BEARING RESTRICTIONS: No  PAIN:  Are you having pain? No  FALLS: Has patient fallen in last 6 months? Yes. Number of falls "some tumbles, no hospitalizations"  LIVING ENVIRONMENT: Lives with: lives with their spouse Lives in: House/apartment - two level, can reside on main floor Stairs: Yes: Internal: Flight steps; on right going up and External: 2 at front and 3 from  carport steps; on left going up Has following equipment at home: Single point cane and Walker - 2 wheeled Walk-in shower with built-in shower seat, no grab bars. Standard height toilet  PLOF: Grossly independent  PATIENT GOALS: Increase mobility and flexibility, get better routine for exercise, improve hand dexterity, manipulate things better, improve posture (leaning forward causes to stumble)  OBJECTIVE:   HAND DOMINANCE: Right  ADLs:  Eating: Independent, denies  frequent spilling Grooming: Independent UB Dressing: Independent, occasional assist with buttons when rushed LB Dressing: Independent, occasional assist with buttons of pants when rushed Toileting: Independent, occasional issue with urge incontinence. Pt being followed by urology Bathing: Independent Tub Shower transfers: Independent, has gripper mat but does not use Equipment: none  IADLs: Shopping: Family manages Light housekeeping: Independent Meal Prep: Pt fixes his own lunch and breakfast, no cutting or anything involved with these Community mobility: Pt reports limitation of distance to 1/2 to 1 mile distance. With fatigue, pt notices more that forward lean which impacts his balance Medication management: Independent, keeps log of medications Financial management: Independent Handwriting: 50% legible and Moderate micrographia  MOBILITY STATUS: Freezing, difficulty with turns, start hesitation, and All depends on medication cycle per patient  POSTURE COMMENTS:  rounded shoulders and forward head  ACTIVITY TOLERANCE: Activity tolerance: Fatigue after 1/2 to 1 mile  FUNCTIONAL OUTCOME MEASURES: Fastening/unfastening 3 buttons: 3 minutes to button 1 button Physical performance test: PPT#2 (simulated eating) 12 secs right hand and 47 secs (pt reports using both hands to eat, but more right-handed) & PPT#4 (donning/doffing jacket): 37 seconds with hospital gown  COORDINATION: 9 Hole Peg test: Right: TBD sec; Left: TBD sec Box and Blocks:  Right 48 blocks, Left 41 blocks  UE ROM:  WFL  UE MMT:   WFL  SENSATION: WFL  MUSCLE TONE: RUE: Within functional limits and LUE: Modifed Ashworth Scale 1 = Slight increase in muscle tone, manifested by a catch and release or by minimal resistance at the end of the range of motion when the affected part(s) is moved in flexion or extension  COGNITION: Overall cognitive status: Within functional limits for tasks assessed for today's  assessment  OBSERVATIONS: Dyskinesias   TODAY'S TREATMENT:                                                                                                                              DATE: N/A    PATIENT EDUCATION: Education details: OT POC Person educated: Patient Education method: Explanation Education comprehension: verbalized understanding  HOME EXERCISE PROGRAM: N/A  GOALS: Goals reviewed with patient? Yes  SHORT TERM GOALS: Target date: 10/01/2022    Pt will be independent with PD specific HEP. Baseline: not yet initiated Goal status: INITIAL  2.  Pt will verbalize understanding of adapted strategies to maximize safety and independence with ADLs/IADLs. Baseline: not yet initiated Goal status: INITIAL  3.  Pt will demonstrate improved fine motor coordination for ADLs as evidenced by decreasing  9 hole peg test score TBD when assessment completed. Baseline: Will assess Goal status: INITIAL  4. Pt will verbalize understanding of ways to keep thinking skills sharp and ways to compensate for STM changes in the future. Baseline: Not initiated Goal status: INITIAL    LONG TERM GOALS: Target date: 11/05/2022   Pt will verbalize understanding of ways to prevent future PD related complications and PD community resources. Baseline: Not initiated Goal status: INITIAL  2.  Pt will write a short paragraph with no significant decrease in size and maintain 100% legibility. Baseline: 50% legible, moderate micrographia Goal status: INITIAL  3.  Pt will demonstrate improved ease with feeding as evidenced by decreasing PPT#2 by 2 secs for the right, and by 10 secs for the left. Baseline: Rt - 12 secs; Lt - 47 secs Goal status: INITIAL  4.  Pt will demonstrate increased ease with dressing as evidenced by decreasing PPT#4 (don/ doff jacket) to 25 secs or less. Baseline: 37 seconds Goal status: INITIAL  5.  Pt will demonstrate improved ease with fastening buttons as evidenced  by decreasing 3 button/unbutton time by 120 seconds Baseline: Stopped assessment at 3 minutes with pt only able to button 1 button Goal status: INITIAL  6.  Pt will be able to place at least 50 blocks using right hand with completion of Box and Blocks test. Baseline: 48 blocks Goal status: INITIAL  ASSESSMENT:  CLINICAL IMPRESSION: Patient is a 77 y.o. right-handed male who was seen today for occupational therapy evaluation for Parkinson's disease. Pt with hx of skin cancer including melanoma, enlarged prostate (s/p TURP), and s/p bilateral DBS placements at [facility]. Denies tremors of BUE, but does report drawing of face and difficulty seeing sometimes. Pt desiring to improve dexterity, balance, and fine motor coordination. Pt would benefit from skilled occupational therapy services to address patient-centered goals, address performance deficits as listed below, and to maximize independence in daily occupations.  PERFORMANCE DEFICITS: in functional skills including ADLs, coordination, dexterity, tone, flexibility, Fine motor control, Gross motor control, mobility, balance, body mechanics, endurance, continence, and UE functional use, cognitive skills including memory, and psychosocial skills including habits, interpersonal interactions, and routines and behaviors.   IMPAIRMENTS: are limiting patient from ADLs, IADLs, leisure, and social participation.   COMORBIDITIES:  may have co-morbidities  that affects occupational performance. Patient will benefit from skilled OT to address above impairments and improve overall function.  MODIFICATION OR ASSISTANCE TO COMPLETE EVALUATION: No modification of tasks or assist necessary to complete an evaluation.  OT OCCUPATIONAL PROFILE AND HISTORY: Detailed assessment: Review of records and additional review of physical, cognitive, psychosocial history related to current functional performance.  CLINICAL DECISION MAKING: LOW - limited treatment options, no  task modification necessary  REHAB POTENTIAL: Good  EVALUATION COMPLEXITY: Low    PLAN:  OT FREQUENCY: 1x/week  OT DURATION: 8 weeks  PLANNED INTERVENTIONS: self care/ADL training, therapeutic exercise, therapeutic activity, neuromuscular re-education, manual therapy, passive range of motion, balance training, functional mobility training, electrical stimulation, ultrasound, paraffin, fluidotherapy, moist heat, cryotherapy, contrast bath, patient/family education, cognitive remediation/compensation, visual/perceptual remediation/compensation, psychosocial skills training, energy conservation, coping strategies training, DME and/or AE instructions, and Re-evaluation  RECOMMENDED OTHER SERVICES: Evaluated by PT and SLP  CONSULTED AND AGREED WITH PLAN OF CARE: Patient  PLAN FOR NEXT SESSION: PWR! movements   [place] Aspin [place], [place] 09/10/2022, 12:56 PM

## 2022-09-11 ENCOUNTER — Ambulatory Visit: Payer: Medicare HMO | Admitting: Orthopedic Surgery

## 2022-09-18 ENCOUNTER — Ambulatory Visit (INDEPENDENT_AMBULATORY_CARE_PROVIDER_SITE_OTHER): Payer: Medicare HMO | Admitting: Orthopedic Surgery

## 2022-09-18 ENCOUNTER — Encounter: Payer: Self-pay | Admitting: Orthopedic Surgery

## 2022-09-18 ENCOUNTER — Other Ambulatory Visit (INDEPENDENT_AMBULATORY_CARE_PROVIDER_SITE_OTHER): Payer: Medicare HMO

## 2022-09-18 VITALS — BP 114/68 | HR 71 | Ht 71.0 in | Wt 158.0 lb

## 2022-09-18 DIAGNOSIS — M25552 Pain in left hip: Secondary | ICD-10-CM

## 2022-09-18 DIAGNOSIS — M25551 Pain in right hip: Secondary | ICD-10-CM | POA: Diagnosis not present

## 2022-09-18 NOTE — Progress Notes (Signed)
Orthopedic Surgery Progress Note   Assessment: Patient is a 77 y.o. male with bilateral hip pain consistent with trochanteric bursitis   Plan: -Discussed treatment options including Tylenol, NSAIDs, steroid injection, PT -After discussion, patient wanted to proceed with PT, so referral was provided to him today -Weight bearing status: As tolerated -No activity restrictions -Follow-up on an as-needed basis  ___________________________________________________________________________  Subjective: Patient has had months of bilateral hip pain.  He feels it is worse on the right side.  Pain is worse by the end of the day.  He feels that over the lateral aspect of the hips.  There is no trauma or injury that preceded the onset of pain.  He feels pain is worse with weightbearing and improves with rest.  No pain radiating down past the hips.  Denies paresthesias or numbness.  Treatments tried: Tylenol  ROS: Denies pain that wakes him at night, unplanned weight loss, numbness in the groin, bowel or bladder incontinence, night sweats, weakness, fever, chills.  Does report feeling off balance from his Parkinson's.  No recent changes in his balance  PMH: Parkinson's Right postural instability Sleep apnea BPH SCC of skin Urethral stricture  PSH: TURP DBS insertion Cystoscopy with urethral dilation Skin cancer excision  SH: EtOH use: approximately 2-3 drinks per week Nicotine use: None Recreational drug use: None  Physical Exam:  General: no acute distress, appears stated age Neurologic: alert, answering questions appropriately, following commands Respiratory: unlabored breathing on room air, symmetric chest rise Psychiatric: appropriate affect, normal cadence to speech  MSK:   -Bilateral hip exam: No pain through range of motion, negative Stinchfield, negative FABER, negative FADIR, TTP over the greater trochanter otherwise nontender to palpation over the remainder of the hip,  negative straight leg raise bilaterally  Imaging: XR of the bilateral hips from 09/18/2022 was independently reviewed and interpreted, showing mild joint space narrowing bilaterally.  No other significant degenerative changes.  No fracture or dislocation seen.   Patient name: Ronald Noble Patient MRN: [id number] Date: 09/18/22

## 2022-09-24 DIAGNOSIS — G20A1 Parkinson's disease without dyskinesia, without mention of fluctuations: Secondary | ICD-10-CM | POA: Diagnosis not present

## 2022-11-19 DIAGNOSIS — L814 Other melanin hyperpigmentation: Secondary | ICD-10-CM | POA: Diagnosis not present

## 2022-11-19 DIAGNOSIS — L565 Disseminated superficial actinic porokeratosis (DSAP): Secondary | ICD-10-CM | POA: Diagnosis not present

## 2022-11-19 DIAGNOSIS — L821 Other seborrheic keratosis: Secondary | ICD-10-CM | POA: Diagnosis not present

## 2022-11-19 DIAGNOSIS — D485 Neoplasm of uncertain behavior of skin: Secondary | ICD-10-CM | POA: Diagnosis not present

## 2022-11-19 DIAGNOSIS — L570 Actinic keratosis: Secondary | ICD-10-CM | POA: Diagnosis not present

## 2022-11-19 DIAGNOSIS — D1801 Hemangioma of skin and subcutaneous tissue: Secondary | ICD-10-CM | POA: Diagnosis not present

## 2022-11-19 DIAGNOSIS — Z85828 Personal history of other malignant neoplasm of skin: Secondary | ICD-10-CM | POA: Diagnosis not present

## 2022-12-02 ENCOUNTER — Other Ambulatory Visit: Payer: Self-pay | Admitting: *Deleted

## 2022-12-02 ENCOUNTER — Telehealth: Payer: Self-pay | Admitting: Neurology

## 2022-12-02 MED ORDER — ROPINIROLE HCL ER 6 MG PO TB24: ORAL_TABLET | ORAL | 0 refills | Status: DC

## 2022-12-02 NOTE — Telephone Encounter (Signed)
Pt request refill for Ropinirole HCl 6 MG TB24 send to [facility][id number]

## 2022-12-02 NOTE — Telephone Encounter (Signed)
Refill has been sent to [facility] 

## 2022-12-10 DIAGNOSIS — Z961 Presence of intraocular lens: Secondary | ICD-10-CM | POA: Diagnosis not present

## 2022-12-10 DIAGNOSIS — H5203 Hypermetropia, bilateral: Secondary | ICD-10-CM | POA: Diagnosis not present

## 2022-12-15 DIAGNOSIS — Z23 Encounter for immunization: Secondary | ICD-10-CM | POA: Diagnosis not present

## 2022-12-18 DIAGNOSIS — G20A1 Parkinson's disease without dyskinesia, without mention of fluctuations: Secondary | ICD-10-CM | POA: Diagnosis not present

## 2022-12-24 DIAGNOSIS — G243 Spasmodic torticollis: Secondary | ICD-10-CM | POA: Diagnosis not present

## 2022-12-24 DIAGNOSIS — G245 Blepharospasm: Secondary | ICD-10-CM | POA: Diagnosis not present

## 2022-12-24 DIAGNOSIS — K117 Disturbances of salivary secretion: Secondary | ICD-10-CM | POA: Diagnosis not present

## 2023-01-27 ENCOUNTER — Encounter (HOSPITAL_COMMUNITY): Payer: Medicare Other

## 2023-02-01 ENCOUNTER — Other Ambulatory Visit: Payer: Self-pay | Admitting: *Deleted

## 2023-02-01 ENCOUNTER — Ambulatory Visit: Payer: Medicare Other | Admitting: Neurology

## 2023-02-01 ENCOUNTER — Ambulatory Visit: Payer: Medicare Other | Admitting: Family Medicine

## 2023-02-01 DIAGNOSIS — G20B2 Parkinson's disease with dyskinesia, with fluctuations: Secondary | ICD-10-CM

## 2023-02-01 MED ORDER — AMANTADINE HCL 100 MG PO CAPS: ORAL_CAPSULE | ORAL | 0 refills | Status: DC

## 2023-02-03 DIAGNOSIS — Z008 Encounter for other general examination: Secondary | ICD-10-CM | POA: Diagnosis not present

## 2023-02-05 ENCOUNTER — Other Ambulatory Visit (HOSPITAL_COMMUNITY): Payer: Self-pay | Admitting: *Deleted

## 2023-02-09 ENCOUNTER — Ambulatory Visit (HOSPITAL_COMMUNITY)
Admission: RE | Admit: 2023-02-09 | Discharge: 2023-02-09 | Disposition: A | Discharge status: Home / Self Care | Payer: Medicare HMO | Source: Ambulatory Visit | Attending: Internal Medicine | Admitting: Internal Medicine

## 2023-02-09 DIAGNOSIS — I2510 Atherosclerotic heart disease of native coronary artery without angina pectoris: Secondary | ICD-10-CM | POA: Diagnosis present

## 2023-02-09 DIAGNOSIS — E785 Hyperlipidemia, unspecified: Secondary | ICD-10-CM | POA: Diagnosis present

## 2023-02-09 MED ORDER — INCLISIRAN SODIUM 284 MG/1.5ML ~~LOC~~ SOSY
284.0000 mg | PREFILLED_SYRINGE | Freq: Once | SUBCUTANEOUS | Status: AC
  Administered 2023-02-09: 284 mg via SUBCUTANEOUS

## 2023-02-09 MED ORDER — INCLISIRAN SODIUM 284 MG/1.5ML ~~LOC~~ SOSY
PREFILLED_SYRINGE | SUBCUTANEOUS | Status: AC
  Filled 2023-02-09: qty 1.5

## 2023-02-17 ENCOUNTER — Other Ambulatory Visit: Payer: Self-pay | Admitting: Neurology

## 2023-02-18 ENCOUNTER — Other Ambulatory Visit: Payer: Self-pay | Admitting: *Deleted

## 2023-02-18 MED ORDER — CLONAZEPAM 0.5 MG PO TABS: 0.5000 mg | ORAL_TABLET | Freq: Every day | ORAL | 0 refills | Status: DC

## 2023-03-23 ENCOUNTER — Telehealth: Payer: Self-pay | Admitting: Neurology

## 2023-03-23 NOTE — Telephone Encounter (Signed)
Pt called to r/s appt due to time conflict with wife's appt

## 2023-03-24 ENCOUNTER — Ambulatory Visit: Payer: Medicare Other | Admitting: Neurology

## 2023-03-24 DIAGNOSIS — K117 Disturbances of salivary secretion: Secondary | ICD-10-CM | POA: Diagnosis not present

## 2023-03-24 DIAGNOSIS — G243 Spasmodic torticollis: Secondary | ICD-10-CM | POA: Diagnosis not present

## 2023-03-24 DIAGNOSIS — G245 Blepharospasm: Secondary | ICD-10-CM | POA: Diagnosis not present

## 2023-04-09 DIAGNOSIS — R350 Frequency of micturition: Secondary | ICD-10-CM | POA: Diagnosis not present

## 2023-04-09 DIAGNOSIS — R351 Nocturia: Secondary | ICD-10-CM | POA: Diagnosis not present

## 2023-04-09 DIAGNOSIS — N401 Enlarged prostate with lower urinary tract symptoms: Secondary | ICD-10-CM | POA: Diagnosis not present

## 2023-04-09 DIAGNOSIS — N3946 Mixed incontinence: Secondary | ICD-10-CM | POA: Diagnosis not present

## 2023-05-24 ENCOUNTER — Telehealth: Payer: Self-pay | Admitting: Neurology

## 2023-05-24 DIAGNOSIS — G20B2 Parkinson's disease with dyskinesia, with fluctuations: Secondary | ICD-10-CM

## 2023-05-24 NOTE — Telephone Encounter (Signed)
Pharmacist Corrie Dandy from [facility] has called to inform pt has requested these two medications amantadine (SYMMETREL) 100 MG capsule &  clonazePAM (KLONOPIN) 0.5 MG tablet That were filled at a [facility] now be filled at  [facility]  (internet currently down there but if need be Pharmacist Corrie Dandy can be reached at [phone])

## 2023-05-25 MED ORDER — AMANTADINE HCL 100 MG PO CAPS: ORAL_CAPSULE | ORAL | 0 refills | Status: DC

## 2023-05-25 MED ORDER — CLONAZEPAM 0.5 MG PO TABS: 0.5000 mg | ORAL_TABLET | Freq: Every day | ORAL | 0 refills | Status: DC

## 2023-05-25 NOTE — Addendum Note (Signed)
Addended by: Huston Foley on: 05/25/2023 05:00 PM   Modules accepted: Orders

## 2023-05-25 NOTE — Telephone Encounter (Signed)
Pt's wife called wanting to know when this will be called in for the pt.

## 2023-05-25 NOTE — Addendum Note (Signed)
Addended by: Guy Begin on: 05/25/2023 10:34 AM   Modules accepted: Orders

## 2023-05-25 NOTE — Telephone Encounter (Signed)
Called pharmacy and they did receive.  LMVM for pt regarding this.

## 2023-05-25 NOTE — Telephone Encounter (Signed)
Pt last seen 07-29-2022, rescheduled last appt  03/24/2023 , to 06-10-2023. Last filled clonazepam 02/18/2023 #90 at [facility], now going to [facility].

## 2023-05-25 NOTE — Telephone Encounter (Signed)
Rx signed for amantadine and clonazepam.

## 2023-06-04 ENCOUNTER — Other Ambulatory Visit: Payer: Self-pay | Admitting: Neurology

## 2023-06-07 ENCOUNTER — Telehealth: Payer: Self-pay | Admitting: Neurology

## 2023-06-07 MED ORDER — ROPINIROLE HCL ER 6 MG PO TB24: ORAL_TABLET | ORAL | 0 refills | Status: DC

## 2023-06-07 NOTE — Telephone Encounter (Signed)
 Pt's wife called stating that the pharmacy called her and informed her that they received a call from the office letting them know that the refill for the pt's Ropinirole HCl 6 MG TB24 has been denied. Wife Ronald Noble called very irritated demanding to know why this has been denied because he has been on this medication for decades. She stated and I quote, "Do I need to come up there to the office to get this straightened out". I informed her that I would send a message to the nurse so that she can get a call back. Please advise.

## 2023-06-07 NOTE — Telephone Encounter (Signed)
 As I was calling the pt wife, she showed up at the office. I informed she that she able to pick up her husband Rx Ropinirole at their pharmacy and pt has a visit this Thursday.. Patient wife was happy and she will see [place] on Thursday. Pt had no questions at this time but was encouraged to call back if questions arise.

## 2023-06-07 NOTE — Telephone Encounter (Signed)
 Pt checking on refill request. Patient has been out of medication for 4 days.

## 2023-06-10 ENCOUNTER — Ambulatory Visit: Payer: Medicare Other | Admitting: Neurology

## 2023-06-10 VITALS — BP 106/61 | HR 60 | Ht 71.0 in | Wt 153.0 lb

## 2023-06-10 DIAGNOSIS — R296 Repeated falls: Secondary | ICD-10-CM | POA: Diagnosis not present

## 2023-06-10 DIAGNOSIS — G20B2 Parkinson's disease with dyskinesia, with fluctuations: Secondary | ICD-10-CM

## 2023-06-10 DIAGNOSIS — Z9689 Presence of other specified functional implants: Secondary | ICD-10-CM

## 2023-06-10 NOTE — Patient Instructions (Signed)
 Please continue with amantadine twice daily but take it in the morning and midday or afternoon. Take ropinirole 6 mg long-acting 2 hours before bedtime. Levodopa half a pill every 2 hours for total of 6-8 doses per day during your waking hours. Use your walker at all times.

## 2023-06-10 NOTE — Progress Notes (Signed)
 Subjective:    Patient ID: Ronald Noble is a 78 y.o. male.  HPI    Interim history:   Ronald Noble is a 78 year old right-handed gentleman with an underlying complex medical history of skin cancer including melanoma, enlarged prostate (s/p TURP), who presents for followup consultation of his R sided predominant Parkinson's disease, complicated by neck dystonia, blepharospasm (for which he receives Xeomin inj. At [facility]), sialorrhea, RBD and sleep disturbance as well as dyskinesias, s/p bilateral DBS placements at [facility], s/p L IPG replacement on the L in 09/2018 and L IPG replacement under Dr. Angelyn Punt at [facility] on 02/12/2021. He is accompanied by his wife again today.  I last saw him on 07/29/2022, at which time he is advised to reduce his Sinemet to 1 pill 3 times a day to see if his facial dyskinesias improved.  He was on amantadine 100 mg twice daily and ropinirole long-acting 6 mg once daily.  Today, 06/10/2023: He reports some ups and downs.  He uses a cane.  He has a rolling walker at the house.  He has had some near falls.  He still has facial dyskinesias levodopa.  Currently, he is taking pill in the morning of levodopa along with his amantadine and half a pill with the next 2 doses and then with the fourth dose he will take a whole pill again.  He is taking ropinirole in the late afternoon or early evening, 6 mg strength long-acting for his restless legs.  He had an appointment with Dr. Rubin Payor at [facility] on 09/02/2022, at which time he was advised to restart botulinum toxin injections for blepharospasm and sialorrhea.  He saw Valentina Shaggy, PA at [facility] on 09/24/2022 for DBS recheck.  He was advised to lower his Sinemet to half a tablet 3 times daily.  He saw Valentina Shaggy, PA at [facility] on 12/18/2022, at which time adding Azilect was considered and increasing amantadine.  He was taking levodopa 1-1/2 tablets 3 times a day at the time.     The patient's allergies,  current medications, family history, past medical history, past social history, past surgical history and problem list were reviewed and updated as appropriate.    Previously (copied from previous notes for reference):    07/29/22: He reports ongoing issues with facial dykinesias, lasting anywhere from 15 minutes to an hour, shortly after he takes his Sinemet.  He takes 1-1/2 pills in the morning and ends up taking 3 or maybe 4 doses per day, the rest of the dose is only 1 pill at a time.  He takes ropinirole between 5 and 7 PM and takes amantadine twice daily.  He has not actually fallen recently thankfully.  His wife has captured his typical facial dyskinesias on video on her cell phone and we reviewed it today.  He has quite significant facial distortion and blepharospasm.  He did run into a door recently as he was having facial dyskinesia and did not see properly.   He saw Valentina Shaggy, PA at [facility] on 03/04/2022.  He was advised to take Sinemet 25-100 mg strength 1-1/2 pills 4 times a day, 3 hourly.   I saw him on 01/27/2022, at which time he reported still having significant facial dyskinesias about 45 minutes after taking levodopa. He had gone to the ED on 12/08/2021 after a fall and he sustained a laceration to the lip.    I saw him on 07/16/2021, at which time he was having  ongoing issues with facial dyskinesias.  He had sustained additional falls unfortunately.  He was supposed to stop walker.     He had an appointment for botulinum toxin injection for sialorrhea in May 2023 with Dr. Rubin Payor, he had a follow-up appointment with Valentina Shaggy at [facility] on 09/22/2021.     He had a follow-up with Dr. Rubin Payor for his blepharospasm in August 2023.       I saw him on 03/17/2021, at which time he reported difficulty controlling his movements and difficulty stopping when he started walking.  He had facial dyskinesias.  He continued to have Xeomin injections for blepharospasm with Dr.  Rubin Payor at [facility].  He had developed eyelid swelling.  He was advised to make an appointment with his eye specialist.  He had an interim appointment with Dr. Manning Charity in December 2022 for blepharitis.  He had an emergency room visit in late December 2022 after a fall.  I reviewed the emergency room records.  He had a follow-up appointment with Valentina Shaggy, PA at [facility] on 04/11/2021, at which time he was advised to increase Sinemet to 1-1/2 tablets every 3 hours, for a total of 5 times a day.  No changes in his stimulations were done at the time.  He had festination and was advised to start using a U step walker rather than a cane.  He had an appointment with Dr. Rubin Payor on 05/07/2021, at which time he was advised to continue with Sinemet, Requip XL 6 mg daily and amantadine 100 mg twice daily.  His MoCA was 26 out of 30 and the addition of Aricept was discussed.  He had Xeomin injection on 05/07/2021 for blepharospasm.  He presented to the emergency room on 06/04/2021 after a fall.  He sustained an abrasion to the left temple and eyelid area.  I reviewed the emergency room records.       I saw him on 11/26/2020, at which time he reported increase in facial dyskinesias.  He had concerns about his balance and difficulty controlling his walking speed.  He had not made much in the way of changes to his medication regimen we had discussed at our last appointment.  He was advised to reduce his long-acting ropinirole to 4 mg daily.  He was advised to take Sinemet 25-100 mg strength 1 pill 7 times a day at 2-hour intervals.  He was advised to continue with amantadine 100 mg twice daily, clonazepam 0.5 mg at bedtime and melatonin at night.  He called in the interim in September 2022 reporting that the reduced ropinirole was not helping especially with his restless leg symptoms.  He wanted to get on the ropinirole long-acting 6 mg strength.  I provided an interim prescription for the 2 mg strength so he could use  up his 4 mg pills.     I saw him on 05/28/2020, at which time he reported that he was taking levodopa 1-1/2 pills 5 times a day but he would also add half pills at times with half a pill of levodopa every 1/2 hour in the first part of the day.  He was advised to continue with Sinemet 1 pill every 2 hours for total of 7 doses.  He was advised to continue with amantadine 100 mg twice daily, clonazepam at night, ropinirole long-acting and melatonin in the evening as well.  They had a recent death in the family, it was unexpected.   He had an interim appointment with  Dr. Rubin Payor in June 2022.    I saw him on 11/23/2019, at which time he felt fairly stable.  He had one fall as he slipped on wet floor carrying groceries but thankfully did not have any injuries.  He voiced concern about his sister who was having trouble with her Parkinson's disease and we discussed this stressor at the time.  He had discontinued Botox injections at [facility] for his blepharospasm.  He was advised to increase his clonazepam to 0.5 mg at bedtime due to increase in dream enactment behavior.   He had an interim appointment with Valentina Shaggy on 03/06/2020.  She recently requested to resume Botox injections at [facility].   He had an interim appointment with Dr. Westley Hummer at [facility] on 09/20/19, next appointment in September and an appointment with Valentina Shaggy in October.    I saw him on 05/03/2019, at which time he reported a problem with his lower back and right knee.  He had to use a cane for a while after a fall in the summer.  He had physical therapy and also a steroid injection in November 2020.  He was overall stable.  He was advised to follow-up routinely in 6 months and continue with his medications.   He emailed in the interim with concerns regarding his sisters Parkinson's disease and associated delusions and hallucinations.   I saw him on 11/01/2018, at which time he reported more problems with his balance and  difficulty with freezing and also difficulty stopping when needed.  He had interim left IPG replacement on 09/23/2018.  He has been receiving Botox injections for his blepharospasm.  He did report a fall and bumped his right hip.  He had an x-ray which was negative for any bony injuries.  He was advised to monitor his hip pain.  He was advised to continue with his Parkinson's medications.    I saw him on 04/28/2018, at which time he reported doing well, had no falls, weight has been stable.  I suggested he keep his medications the same.   I saw him on 10/26/2017, at which time he reported doing okay, tried Gocovri for one month, but it was cost prohibitive. He was back on amantadine 100 mg bid. He tried the reduced dose of amantadine but did not do well on the lower dose. He reported no recent falls. Was getting Botox injections for blepharospasm through Dr. Rubin Payor. He was on Sinemet 1-1/2 pills 5 times a day, long-acting Requip once daily at 5 PM. I asked him to continue with his medication regimen.   I saw him on 04/28/2017, at which time he reported some daytime somnolence. He was supposed to get cataract surgeries and colonoscopy done. He had done well after Botox injections at [facility]. He was on amantadine twice daily and I suggested we reduce it to once daily and consider taking him off altogether. I suggested he take his long-acting Requip 6 mg once daily in the evening, instead of during the day. We kept his Sinemet at 1-1/2 pills 5 times a day.   I saw him on 10/29/2016, at which time he reported doing okay, he had no recent falls, his left eye ptosis improved after adjustment of his DBS. He did not end up having any Botox injections to his eyelid. He was on Sinemet 1-1/2 pills 5 times a day, 3 hourly intervals, starting approximately at 8 AM. He was on amantadine twice daily and Requip XL around 1 PM.  I saw him on 04/09/2016, at which time he was doing okay, he had improvement in his  blood first specimen on the left in facial dyskinesias. He had been followed at [facility]. He was started on amantadine and was taking it twice daily. He was off of Stalevo completely. He felt that his abdominal cramping and facial dyskinesias improved after he came off of Stalevo altogether. He was in the process of being scheduled for Botox injections for his blepharospasm at [facility]. He was still on once daily long-acting Requip, when he tried to stop it completely he noticed a flareup of his RLS. He denied any recent falls. He was exercising regularly. He did have some constipation but generally under control with some regularity in his bowel movements reported.    I saw him on 07/09/2015, at which time he reported no recent syncope. He had a tilt table test in January 2017 which showed abnormal findings but minimal symptoms reported. He saw Dr. Shawnee Knapp with vascular surgery for consultation on 05/28/2015. He had a 24-hour blood pressure monitor. He had a follow-up appointment pending with his vascular specialist. He also had an appointment pending with Dr. Rubin Payor in October 2017. He felt he pulled a muscle in his upper back on the right. He was on Stalevo about 4 times a day, sometimes 5 times a day depending on his day to day activities. He was on long-acting Requip 6 mg once daily. He was sleeping a little better. He had been advised to start using compression stockings but had not started yet. He had noticed worsening of droopy eyelid. He mailed in December 2017 Re: Blepharospasm and treatment for this. He had been seeing an ophthalmologist.   I saw him on 03/11/2015, at which time he reported a recent fall in September 2016. He had just come back from a road trip and after being in the car for about 2-1/2 hours he got out of the car and fell to the ground for his wife even realize what happened. He fell on pavement and chipped his tooth. They were with some friends who helped. He had to have a  crown placed over his chipped tooth. His DBS settings were adjusted when he had his follow-up at [facility]. His orthostatic blood pressure values were stable. Since his settings were changed he had no other syncopal spell. He did have some medication changes as well. He was no longer on clonazepam. Stalevo was 100 mg strength 4-5 times a day. Requip long-acting was 6 mg once daily. She was on long-acting melatonin 6 mg each night. He was advised to drink 4 bottles of water. He was scheduled for tilt table test as well. His wife was concerned about him climbing on ladders. We talked about gait safety and syncope risk. He was advised not to climb on any ladders or work at heights.    I saw him on 11/07/2014, at which time he reported doing reasonably well. He had nasal congestion after his fall and injury. Of note, unfortunately, he had a syncopal spell on 10/29/2014 and hit his nose as he fell and sustained a nasal fracture. I reviewed the emergency room records. He was referred to ENT. His laceration was repaired with Dermabond. He reported that he stood up and felt lightheaded and then passed out. He fell and hit his face. He had a CT head without contrast as well as maxillofacial CT without contrast on 10/29/2014 which showed: RIGHT and LEFT nasal bone  fractures. Small displaced fracture fragment on the LEFT. No skull fracture or intracranial hemorrhage. Unremarkable appearing deep brain stimulator leads. He fell at work. He had taken Levitra at night and 2 days prior he was working hard in the yard. He is notoriously not drinking enough water he admits. He had taken his Stalevo in the morning and was due for his next dose and became lightheaded when he got up quickly from his desk. He had been sitting for about 45 minutes consistently at his desk. He did have a warning sign and kept walking instead of sitting down again. He had a follow-up appointment at [facility] on 11/02/2014 and his Requip was  increased slightly again. He had re-programming of his DBS as well. He had reduced the Requip XL to 3 mg twice daily but this was increased to 4 mg twice daily. His Stalevo was at 4-6 pills daily depending on his work day. He does get sleepy at night around 9:30 and often falls asleep while watching TV. He tried Melatonin long-acting but in combination with clonazepam at night it did not work well and in fact he had insomnia. He tried to reduce his clonazepam to 0.25 mg each night but increased it back to 0.5 mg each night. For his nose fracture he saw Dr. Jenne Pane and was scheduled for surgery. I did not change his medications. I asked him to drink more water and reminded him to change positions slowly.   I saw him on 08/07/2014, at which time he reported doing quite well after his DBS placement. His wife felt that he was doing great. He was eating well. He was not drinking enough water. He had trace edema in his distal lower extremities. He had 2 appointments for DBS programming. His Stalevo was reduced from 6 pills a day to 4 pills a day but then he increased it back to 5 pills a day. He was off generic Sinemet. He was on Azilect once daily and Requip long-acting 8 mg twice daily.    He's been back to work. He is currently on Azilect once daily, Requip XL 8 mg twice daily, Stalevo 100 mg strength one pill 5 times a day and he has stopped the carbidopa-levodopa. I reviewed his outside records from Dr. Angelyn Punt at [facility] neurosurgery. Patient had bilateral STN DBS electrodes placed on 06/01/2014. He had pulse generators placed approximately 3 weeks after that, 06/13/14.   I saw him on 02/01/2014, at which time he reported difficulty staying asleep. He had not consistently tried melatonin. He had problems with urinary urgency and was advised to try Kegel exercises by his urologist. His PCP talked to an infectious diseases specialist and it was determined he did not actually have  Lyme disease.    He did not show for an appointment on 01/30/2014 - he genuinely forgot!   I saw him on 12/19/2013, at which time he reported that his medication was not working as well. He reported more freezing episodes and more off time. Of note, he had been using a new protein milkshake that he was drinking first thing in the morning. He had lost some weight and in an effort to improve his weight loss he had started this new supplements. His primary care physician had referred him to a nutritionist. We also talked about DBS evaluation and he requested to see another specialist for DBS candidacy. I referred him to Dr. Rubin Payor at [facility] I also advised him to  discontinue using the new protein supplement as I felt it may be interfering with the absorption of his Stalevo. I also suggested a trial of Parcopa half a pill 3 times a day at 11, 2 PM and 7 PM and we kept the Stalevo the same. He was advised to use MiraLAX as needed for constipation.   I saw him on 07/31/13, at which time [name], including swimming, yoga, and weights. He has been on Stalevo 100 mg every 2 1/2 hours starting at 7:30 AM for 6 doses. He stopped clonazepam, as he felt some daytime sluggishness. He has been taking C/L 1/2 tid in between. He had no cognitive issues, but did have some "downtime" midday. I did not change his medications. He had an appointment with Dr. Lorin Picket at [facility] in June, but there was a mix up with his appointment and he does not have an appointment until next year.   In the interim, he was diagnosed with Lyme d/s in July. He was tested and treated by his PCP has has been referred to an infectious d/s doctor. He brings in his latest lying test results from 12/05/2013 which I reviewed: It looks like his IgM antibody status is negative but his IgG antibody status has not quite turn positive as far as I understand the report. That means he is probably in the  subacute or early chronic phase if I interpret this correctly. He requested a sooner than scheduled appointment, due to feeling more freezing and more off time. This starts in the late morning hours but can also go into the afternoon after lunch. Cognitively and mood wise he feels stable. He does not sleep well at night. He has not consistently tried melatonin I recommended.   I saw him on 01/30/2013, at which time I felt he was stable. We talked about DBS surgery. I did not make any medication changes. He is aware of the increase risk of melanoma associated with DAs and his dermatologist is aware that he is taking Requip XL.   I first met him on 09/27/2012, which time I did not make any changes to his medication and did not order any new test as he was stable. He saw Dr. Lorin Picket at [facility] and discussed DBS in the past but was not deemed a cadidate for it at the time as he was doing well. He was told by Dr. Lorin Picket to stay on schedule with his medication. 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 Sleep has been an issue. He goes to sleep well, but wakes up after 45 minutes to 1 hour. He is seeing a urologist. He exercises regularly.   He previously followed with Dr. Avie Echevaria and was last seen by him on 05/25/2012, at which time Dr. Sandria Manly felt he was stable, but because of off time at 2 hours he increased his carbidopa-levodopa by adding half of a 10-100 mg strength 3 times a day to his regimen. He recommended decreasing his Stalevo to 125 mg from 100 mg 6 times a day. He has been on Stalevo 125 mg 6 times a day, at 7:30, 10, 12:30, 3 PM, 5:30 PM and 8 PM. He is on clonazepam 0.5 mg at night, Requip long-acting 6 mg twice daily, rasagiline 1 mg once daily, carbidopa-levodopa 10-100 mg strength half a tablet 3 times a day, 7:30 to 10, then midday and later in the evening.  He was followed by Dr. Sandria Manly since 2008 with a history of Parkinson's disease. He has a history of increased tone and  slowness as well as tremor with R sided predominance. He was initially placed on Requip XL and then rasagiline was added in October 2008. In February 2010 Stalevo was added. He had low vitamin D levels in 2008. ANA, ESR, ceruloplasmin were normal except for slightly low ceruloplasmin of 16. Urine copper test was negative. Methylmalonic acid was normal. Urine for heavy metals is normal as well. MRI brain with and without contrast on 01/07/2007 was normal. He had a sleep study in October 2008 showing PLMs and evidence of RBD. Repeat sleep study in September 2011 showed supine obstructive sleep apnea and RBD. He had a CPAP titration study in late September 2011 which was not very successful. He has been on clonazepam at night. He has not noticed any compulsive behaviors, memory loss, depression, swelling, or orthostatic dizziness. He recently had a skin biopsy showing atypical cells, concerning for melanoma.   He works full-time as an Pensions consultant, Air traffic controller. He exercises regularly and does yoga.     His Past Medical History Is Significant For: Past Medical History:  Diagnosis Date   BPH (benign prostatic hyperplasia)    COVID 04/24/2020   scratchy throat x 1 day all symptoms resolved   Parkinson disease    Skin cancer of face    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   Sleep apnea    mild no cpap used   Urethral stricture     His Past Surgical History Is Significant For: Past Surgical History:  Procedure Laterality Date   BURR HOLE W/ STEREOTACTIC INSERTION OF DBS LEADS / INTRAOP MICROELECTRODE RECORDING  04/2014   [facility], [facility] leads replaced in 2020   COLONOSCOPY  2019   CYSTOSCOPY WITH URETHRAL DILATATION N/A 06/15/2019   Procedure: CYSTOSCOPY WITH URETHRAL DILATATION;  Surgeon: Marcine Matar, MD;  Location: [facility];  Service: Urology;  Laterality: N/A;  30 MINS   CYSTOSCOPY WITH URETHRAL DILATATION N/A 05/09/2020   Procedure: CYSTOSCOPY WITH BALLOON  DILATATION OF POSTERIOR URETHRAL STRICTURE, FLUOROSCOPIC INTERPRETATION;  Surgeon: Marcine Matar, MD;  Location: [facility];  Service: Urology;  Laterality: N/A;   KNEE SURGERY  yrs ago   exploratory/left   SKIN CANCER EXCISION  01/2014   face   TRANSURETHRAL RESECTION OF PROSTATE     2008    His Family History Is Significant For: Family History  Problem Relation Age of Onset   COPD Mother    Cancer Father        Bladder   Parkinson's disease Sister    Parkinson's disease Sister    Colon cancer Neg Hx    Esophageal cancer Neg Hx    Stomach cancer Neg Hx     His Social History Is Significant For: Social History   Socioeconomic History   Marital status: Married    Spouse name: Marylene Land   Number of children: 4   Years of education: 19   Highest education level: Not on file  Occupational History   Occupation: LAWYER    Employer: [employer]  Tobacco Use   Smoking status: Never   Smokeless tobacco: Never  Vaping Use   Vaping status: Never Used  Substance and Sexual Activity   Alcohol use: Yes    Alcohol/week: 1.0 - 3.0 standard drink of alcohol    Types: 1 - 3 Glasses of wine  per week    Comment: occasional   Drug use: No   Sexual activity: Yes  Other Topics Concern   Not on file  Social History Narrative   Patient is right handed and resides with wife   1-2 cups of coffee a day    Social Drivers of Corporate investment banker Strain: Not on file  Food Insecurity: Not on file  Transportation Needs: Not on file  Physical Activity: Not on file  Stress: Not on file  Social Connections: Not on file    His Allergies Are:  Allergies  Allergen Reactions   Rosuvastatin     myalgias  :   His Current Medications Are:  Outpatient Encounter Medications as of 06/10/2023  Medication Sig   acetaminophen (TYLENOL) 325 MG tablet Take 2 tablets (650 mg total) by mouth every 6 (six) hours as needed.   amantadine (SYMMETREL) 100 MG capsule  TAKE 1 CAPSULE(100 MG) BY MOUTH TWICE DAILY   carbidopa-levodopa (SINEMET IR) 25-100 MG tablet TAKE 1 AND 1/2 TABLETS BY MOUTH 5 TIMES DAILY.   cholecalciferol (VITAMIN D3) 25 MCG (1000 UNIT) tablet Take 2,000 Units by mouth daily.   clonazePAM (KLONOPIN) 0.5 MG tablet Take 1 tablet (0.5 mg total) by mouth at bedtime.   ibandronate (BONIVA) 150 MG tablet Take 150 mg by mouth every 30 (thirty) days. Take in the morning with a full glass of water, on an empty stomach, and do not take anything else by mouth or lie down for the next 30 min.   Melatonin 5 MG SUBL Place under the tongue.   NEXLETOL 180 MG TABS Take 1 tablet by mouth daily.   Ropinirole HCl 6 MG TB24 TAKE 1 TABLET BY MOUTH DAILY BETWEEN 5 AND 7PM.   sildenafil (REVATIO) 20 MG tablet Take 20 mg by mouth as needed.   UNABLE TO FIND Med Name: infusion for cholesterol treatment once a year   atorvastatin (LIPITOR) 10 MG tablet Take 1 tablet (10 mg total) by mouth daily. (Patient not taking: Reported on 06/10/2023)   cephALEXin (KEFLEX) 500 MG capsule Take 1 capsule (500 mg total) by mouth 4 (four) times daily. (Patient not taking: Reported on 06/10/2023)   Coenzyme Q10 (COQ10) 200 MG CAPS Take 200 mg by mouth daily. (Patient not taking: Reported on 06/10/2023)   No facility-administered encounter medications on file as of 06/10/2023.  :  Review of Systems:  Out of a complete 14 point review of systems, all are reviewed and negative with the exception of these symptoms as listed below:   Review of Systems  Neurological:        Patient in room #5 with his wife. Patient states he would like to discuss his Rx mediations and their side affects. Patient wife states he losing weight and she is concerns.    Objective:  Neurological Exam  Physical Exam Physical Examination:   Vitals:   06/10/23 1111  BP: 106/61  Pulse: 60    General Examination: The patient is a very pleasant 78 y.o. male in no acute distress. He appears frail and  deconditioned, well-groomed.    HEENT: Normocephalic, atraumatic, pupils are equal, round and reactive to light, extraocular tracking is moderately impaired, has no significant blepharospasm or facial grimacing currently.  He has moderate hypophonia and mild dysarthria and mild sialorrhea.  He has moderate nuchal rigidity. His DBS wires are tunneled on the left side.  No difficulty with tongue protrusion.   Chest: is clear to auscultation without  wheezing, rhonchi or crackles noted.  Unremarkable generator site.     Heart: sounds are regular and normal without murmurs, rubs or gallops noted.    Abdomen: is soft, non-tender and non-distended.   Extremities: There is no obvious edema in the distal lower extremities bilaterally.   Skin: is warm and dry with no trophic changes noted. Age-related changes are noted on the skin and scars from prior skin cancer removals.    Musculoskeletal: exam reveals no obvious joint deformities. Upper body tilt to R while sitting and more stooped posture.      Neurologically:  Mental status: The patient is awake and alert, paying good attention. He is able to provide his history but details are provided by his wife.  His memory, attention, language and knowledge are appropriate. There is no aphasia, agnosia, apraxia or anomia. There is a mild degree of bradyphrenia. Speech is mild to moderately hypophonic with moderate dysarthria noted at times. Mood is congruent and affect is normal.  Cranial nerves are as described above under HEENT exam. Motor exam: thin bulk, and global strength of 4 out of 5.  There are no obvious dyskinesias in the upper or lower extremities noted today.  No obvious resting tremor.  Tone is mild to moderately rigid with presence of cogwheeling in the right upper extremity. There is overall moderate bradykinesia. There is no drift or rebound.  Fine motor skills exam: Fine motor skills are moderately impaired, more noticeable on the right.     Cerebellar testing shows no dysmetria or intention tremor. There is no truncal or gait ataxia.  Sensory exam is intact to light touch throughout.  Gait, station and balance: He stands up from the seated position with mild difficulty and has to push himself up, but requires no assistance. He is noted to lean to the right, posture is moderately stooped, but seems stable.  He has trouble standing narrow-based.  He walks with a single-point cane but is unsteady with a cane.  Has particular trouble with turns, and fall risk.  Balance is impaired and he turns with unsteadiness.     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 At [facility]), sialorrhea, RBD and sleep disturbance as well as dyskinesias, s/p bilateral DBS placements at [facility], s/p L IPG replacement on the L in 09/2018 and L IPG replacement under Dr. Angelyn Punt at [facility] on 02/12/2021.  He is On Sinemet, Requip long-acting and amantadine. He has had orthostatic hypotension, syncopal spells, falls with injuries, facial dyskinesias, blepharospasm, constipation and bladder hyperactivity. He had b/l DBS placements at [facility] on 06/01/14 and generator placement on 06/13/2014. Left-sided battery replacement/generator replacement in June 2020 and again on 02/12/21. Overall, he has done well over the course of years but he has had progressive decline in the recent year or 2.   His facial dyskinesias have been progressively worse.  He was looking into getting a U step walker but did not end up getting it.  He retired in April 2023 and is no longer driving due to his blepharospasm and facial dyskinesias which affect his vision  intermittently.  We had talked about tapering off his amantadine in the past.  We tried to get him the long-acting amantadine  but it was cost prohibitive.   Today, I suggested he reduce his levodopa dose to half a pill and take half a pill every 2 hours for total of 6-8 doses per day.  He is encouraged to put a 2-hour timer after he takes each dose.  He is advised to continue with ropinirole 6 mg but take it 2 hours before bedtime.  He is advised to continue with amantadine twice daily, avoid in the evening. He is furthermore advised to use his walker at all times.  I do not think he is completely safe to use a cane only at this juncture. He is advised to continue with his appointments as scheduled at [facility] and follow-up with me in about 6 months.   I answered all their questions today and the patient and his wife were in agreement. I spent 45 minutes in total face-to-face time and in reviewing records during pre-charting, more than 50% of which was spent in counseling and coordination of care, reviewing test results, reviewing medications and treatment regimen and/or in discussing or reviewing the diagnosis of PD, the prognosis and treatment options. Pertinent laboratory and imaging test results that were available during this visit with the patient were reviewed by me and considered in my medical decision making (see chart for details).

## 2023-06-18 IMAGING — DX DG FINGER LITTLE 2+V*L*
3 series · 3 of 3 positions shown · non-contrast
Comparison: None.

CLINICAL DATA: Reduction of dislocation

EXAM:
LEFT LITTLE FINGER 2+V

[finger ap]
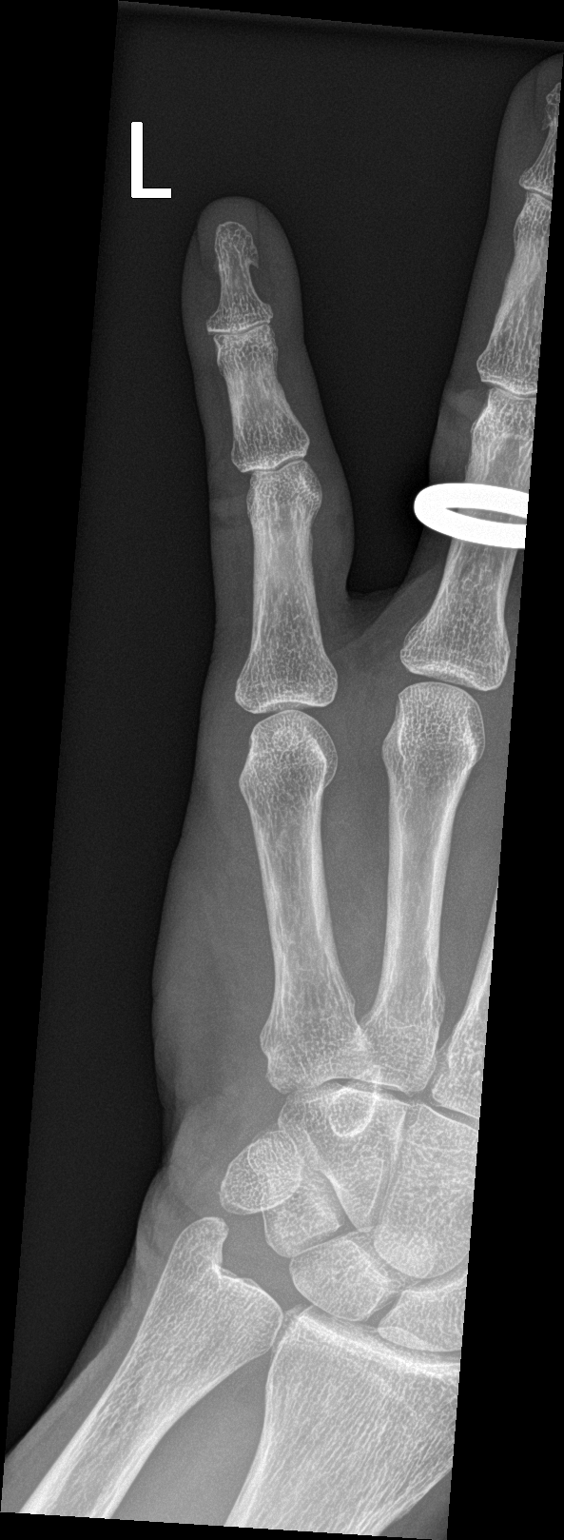

[finger obl]
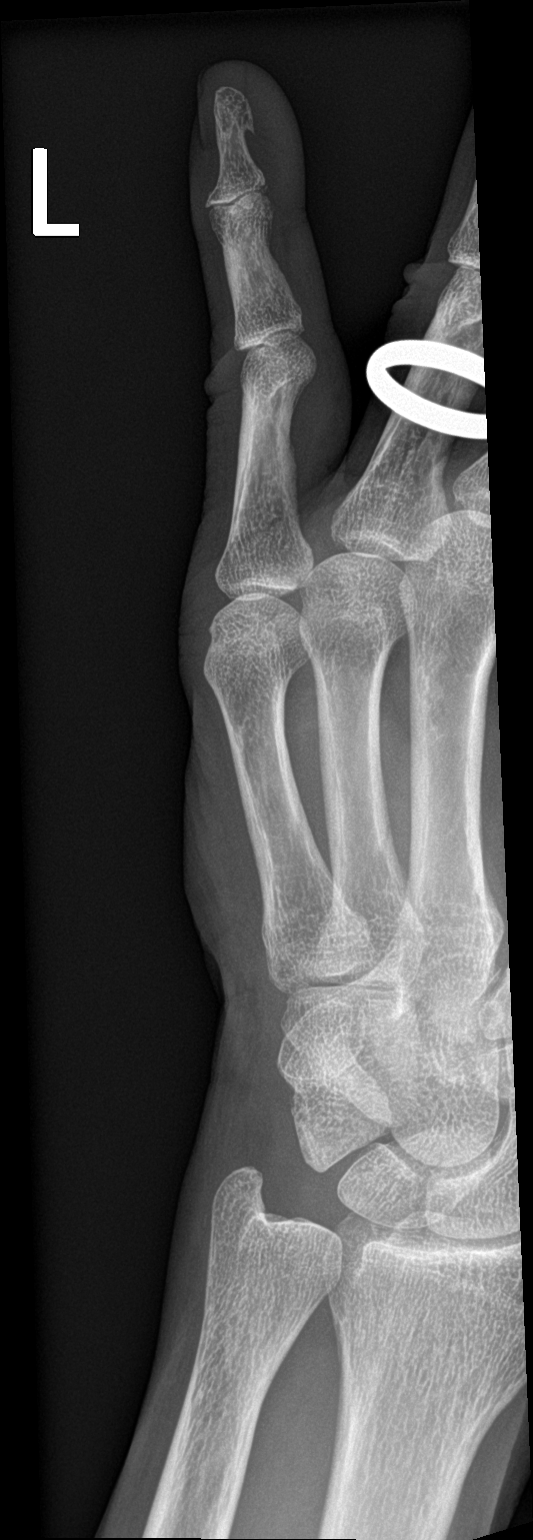

[finger lat]
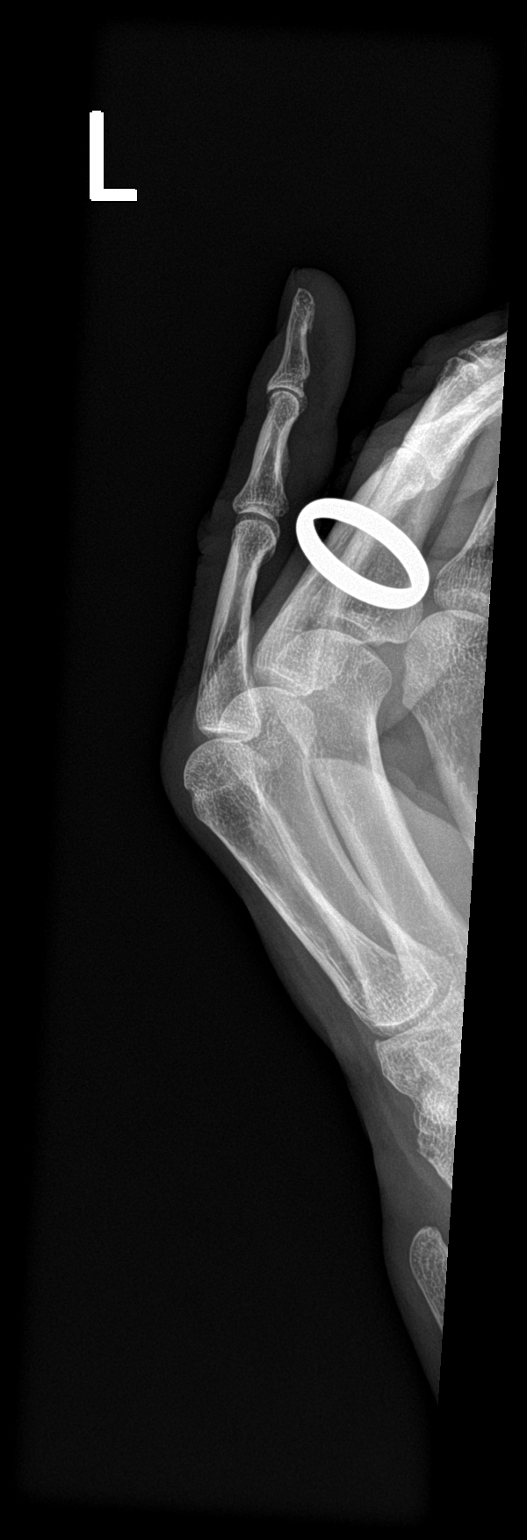

[3 of 3 positions shown; findings below may reference images not displayed]

FINDINGS: Anatomic alignment following reduction of left little finger
dislocation. No fracture.
IMPRESSION: Anatomic alignment of the left little finger following reduction.

## 2023-06-23 DIAGNOSIS — G245 Blepharospasm: Secondary | ICD-10-CM | POA: Diagnosis not present

## 2023-06-23 DIAGNOSIS — G243 Spasmodic torticollis: Secondary | ICD-10-CM | POA: Diagnosis not present

## 2023-06-23 DIAGNOSIS — K117 Disturbances of salivary secretion: Secondary | ICD-10-CM | POA: Diagnosis not present

## 2023-07-28 ENCOUNTER — Encounter (HOSPITAL_COMMUNITY): Payer: Medicare HMO

## 2023-07-30 ENCOUNTER — Other Ambulatory Visit: Payer: Self-pay | Admitting: Neurology

## 2023-08-06 ENCOUNTER — Other Ambulatory Visit (HOSPITAL_COMMUNITY): Payer: Self-pay | Admitting: *Deleted

## 2023-08-09 ENCOUNTER — Inpatient Hospital Stay (HOSPITAL_COMMUNITY): Admission: RE | Admit: 2023-08-09 | Payer: Medicare HMO | Source: Ambulatory Visit

## 2023-08-16 DIAGNOSIS — R350 Frequency of micturition: Secondary | ICD-10-CM | POA: Diagnosis not present

## 2023-08-16 DIAGNOSIS — N390 Urinary tract infection, site not specified: Secondary | ICD-10-CM | POA: Diagnosis not present

## 2023-08-16 DIAGNOSIS — R5383 Other fatigue: Secondary | ICD-10-CM | POA: Diagnosis not present

## 2023-08-16 DIAGNOSIS — G20A1 Parkinson's disease without dyskinesia, without mention of fluctuations: Secondary | ICD-10-CM | POA: Diagnosis not present

## 2023-08-16 DIAGNOSIS — R319 Hematuria, unspecified: Secondary | ICD-10-CM | POA: Diagnosis not present

## 2023-08-16 DIAGNOSIS — R269 Unspecified abnormalities of gait and mobility: Secondary | ICD-10-CM | POA: Diagnosis not present

## 2023-08-20 ENCOUNTER — Other Ambulatory Visit: Payer: Self-pay | Admitting: Neurology

## 2023-08-20 DIAGNOSIS — G20B2 Parkinson's disease with dyskinesia, with fluctuations: Secondary | ICD-10-CM

## 2023-08-23 NOTE — Telephone Encounter (Signed)
 I LMVM for pt that rx's sent in.

## 2023-08-23 NOTE — Telephone Encounter (Signed)
 Pt's wife called wanting to know when these medications will be filled.

## 2023-08-23 NOTE — Telephone Encounter (Signed)
 Pt last seen 06-10-2023, next appt 12-16-2023  Last fill 05-25-2023 #180 amantadine100mg  po am, afternoon and clonazepam  0.5mg  po at bedtime,

## 2023-09-08 DIAGNOSIS — Z9682 Presence of neurostimulator: Secondary | ICD-10-CM | POA: Diagnosis not present

## 2023-09-08 DIAGNOSIS — K117 Disturbances of salivary secretion: Secondary | ICD-10-CM | POA: Diagnosis not present

## 2023-09-08 DIAGNOSIS — G243 Spasmodic torticollis: Secondary | ICD-10-CM | POA: Diagnosis not present

## 2023-09-08 DIAGNOSIS — G20B1 Parkinson's disease with dyskinesia, without mention of fluctuations: Secondary | ICD-10-CM | POA: Diagnosis not present

## 2023-09-08 DIAGNOSIS — Z9689 Presence of other specified functional implants: Secondary | ICD-10-CM | POA: Diagnosis not present

## 2023-09-08 DIAGNOSIS — G20A1 Parkinson's disease without dyskinesia, without mention of fluctuations: Secondary | ICD-10-CM | POA: Diagnosis not present

## 2023-09-08 DIAGNOSIS — G245 Blepharospasm: Secondary | ICD-10-CM | POA: Diagnosis not present

## 2023-09-27 ENCOUNTER — Telehealth: Payer: Self-pay | Admitting: Neurology

## 2023-09-28 NOTE — Telephone Encounter (Signed)
 Pt's wife, Barth Trella called to check on status of refill

## 2023-09-28 NOTE — Telephone Encounter (Signed)
 Refill sent in

## 2023-10-06 DIAGNOSIS — R350 Frequency of micturition: Secondary | ICD-10-CM | POA: Diagnosis not present

## 2023-10-06 DIAGNOSIS — R8290 Unspecified abnormal findings in urine: Secondary | ICD-10-CM | POA: Diagnosis not present

## 2023-10-06 DIAGNOSIS — R3915 Urgency of urination: Secondary | ICD-10-CM | POA: Diagnosis not present

## 2023-10-21 DIAGNOSIS — G20B2 Parkinson's disease with dyskinesia, with fluctuations: Secondary | ICD-10-CM | POA: Diagnosis not present

## 2023-10-27 ENCOUNTER — Emergency Department (HOSPITAL_BASED_OUTPATIENT_CLINIC_OR_DEPARTMENT_OTHER): Admitting: Radiology

## 2023-10-27 ENCOUNTER — Emergency Department (HOSPITAL_BASED_OUTPATIENT_CLINIC_OR_DEPARTMENT_OTHER)
Admission: EM | Admit: 2023-10-27 | Discharge: 2023-10-27 | Disposition: A | Discharge status: Home / Self Care | Attending: Emergency Medicine | Admitting: Emergency Medicine

## 2023-10-27 ENCOUNTER — Emergency Department (HOSPITAL_BASED_OUTPATIENT_CLINIC_OR_DEPARTMENT_OTHER)

## 2023-10-27 ENCOUNTER — Other Ambulatory Visit: Payer: Self-pay

## 2023-10-27 ENCOUNTER — Encounter (HOSPITAL_BASED_OUTPATIENT_CLINIC_OR_DEPARTMENT_OTHER): Payer: Self-pay

## 2023-10-27 DIAGNOSIS — W01198A Fall on same level from slipping, tripping and stumbling with subsequent striking against other object, initial encounter: Secondary | ICD-10-CM | POA: Insufficient documentation

## 2023-10-27 DIAGNOSIS — S01112A Laceration without foreign body of left eyelid and periocular area, initial encounter: Secondary | ICD-10-CM | POA: Insufficient documentation

## 2023-10-27 DIAGNOSIS — Y92002 Bathroom of unspecified non-institutional (private) residence single-family (private) house as the place of occurrence of the external cause: Secondary | ICD-10-CM | POA: Diagnosis not present

## 2023-10-27 DIAGNOSIS — M25511 Pain in right shoulder: Secondary | ICD-10-CM | POA: Diagnosis not present

## 2023-10-27 DIAGNOSIS — G20A1 Parkinson's disease without dyskinesia, without mention of fluctuations: Secondary | ICD-10-CM | POA: Diagnosis not present

## 2023-10-27 DIAGNOSIS — S0990XA Unspecified injury of head, initial encounter: Secondary | ICD-10-CM | POA: Diagnosis not present

## 2023-10-27 DIAGNOSIS — M19011 Primary osteoarthritis, right shoulder: Secondary | ICD-10-CM | POA: Diagnosis not present

## 2023-10-27 DIAGNOSIS — S2231XA Fracture of one rib, right side, initial encounter for closed fracture: Secondary | ICD-10-CM | POA: Diagnosis not present

## 2023-10-27 DIAGNOSIS — W19XXXA Unspecified fall, initial encounter: Secondary | ICD-10-CM

## 2023-10-27 MED ORDER — LIDOCAINE-EPINEPHRINE (PF) 2 %-1:200000 IJ SOLN
10.0000 mL | Freq: Once | INTRAMUSCULAR | Status: AC
  Administered 2023-10-27: 10 mL via INTRADERMAL
  Filled 2023-10-27: qty 20

## 2023-10-27 NOTE — ED Triage Notes (Signed)
 Per patient's wife, patient has hx of parkinsons and fell this morning in the bathroom and hit his head on toilet. Denies blood thinners.

## 2023-10-27 NOTE — ED Provider Notes (Signed)
 [place] EMERGENCY DEPARTMENT AT [facility] Provider Note   CSN: [id number] Arrival date & time: 10/27/23  9194     Patient presents with: Ronald Noble is a 78 y.o. male.   78 yo M with a chief complaint of a fall.  Patient was ambulating to the bathroom and slipped and struck his head on the ground.  Patient has a history of Parkinson's and is unsteady at baseline.  Was wearing socks and that was thought to be the cause of his fall.  He just took his parkinsonian medications and usually has some issue with cognition and movement shortly thereafter.  Wife feels like he is at his baseline.  Complaining mostly of left head pain.  Also with some right shoulder pain.   Fall       Prior to Admission medications   Medication Sig Start Date End Date Taking? Authorizing Provider  acetaminophen  (TYLENOL ) 325 MG tablet Take 2 tablets (650 mg total) by mouth every 6 (six) hours as needed. 04/01/21   Elnor Jayson LABOR, DO  amantadine  (SYMMETREL ) 100 MG capsule TAKE ONE CAPSULE TWICE DAILY 08/23/23   Buck Saucer, MD  atorvastatin  (LIPITOR) 10 MG tablet Take 1 tablet (10 mg total) by mouth daily. Patient not taking: Reported on 06/10/2023 10/10/20   Rolan Ezra RAMAN, MD  carbidopa -levodopa  (SINEMET  IR) 25-100 MG tablet TAKE 1 AND 1/2 TABLETS BY MOUTH 5 TIMES DAILY. 08/20/21   Athar, Saima, MD  cephALEXin  (KEFLEX ) 500 MG capsule Take 1 capsule (500 mg total) by mouth 4 (four) times daily. Patient not taking: Reported on 06/10/2023 02/24/22   Logan Ubaldo NOVAK, PA-C  cholecalciferol (VITAMIN D3) 25 MCG (1000 UNIT) tablet Take 2,000 Units by mouth daily.    [provider]  clonazePAM  (KLONOPIN ) 0.5 MG tablet TAKE ONE TABLET AT BEDTIME 08/23/23   Athar, Saima, MD  Coenzyme Q10 (COQ10) 200 MG CAPS Take 200 mg by mouth daily. Patient not taking: Reported on 06/10/2023 08/01/20   Rolan Ezra RAMAN, MD  ibandronate (BONIVA) 150 MG tablet Take 150 mg by mouth every 30 (thirty)  days. Take in the morning with a full glass of water , on an empty stomach, and do not take anything else by mouth or lie down for the next 30 min.    [provider]  Melatonin 5 MG SUBL Place under the tongue.    [provider]  NEXLETOL 180 MG TABS Take 1 tablet by mouth daily. 07/05/21   [provider]  Ropinirole  HCl 6 MG TB24 TAKE ONE TABLET EVERY DAY BETWEEN 5-7pm 09/28/23   Athar, Saima, MD  sildenafil (REVATIO) 20 MG tablet Take 20 mg by mouth as needed.    [provider]  UNABLE TO FIND Med Name: infusion for cholesterol treatment once a year    [provider]    Allergies: Rosuvastatin     Review of Systems  Updated Vital Signs BP 103/60   Pulse 71   Temp 97.6 F (36.4 C) (Oral)   Resp 16   Ht 5' 11 (1.803 m)   Wt 71.9 kg   SpO2 99%   BMI 22.11 kg/m   Physical Exam Vitals and nursing note reviewed.  Constitutional:      Appearance: He is well-developed.  HENT:     Head: Normocephalic.     Comments: Stellate appearing laceration to the left temporal area. Eyes:     Pupils: Pupils are equal, round, and reactive to light.  Neck:  Vascular: No JVD.  Cardiovascular:     Rate and Rhythm: Normal rate and regular rhythm.     Heart sounds: No murmur heard.    No friction rub. No gallop.  Pulmonary:     Effort: No respiratory distress.     Breath sounds: No wheezing.  Abdominal:     General: There is no distension.     Tenderness: There is no abdominal tenderness. There is no guarding or rebound.  Musculoskeletal:        General: Normal range of motion.     Cervical back: Normal range of motion and neck supple.     Comments: No obvious bony tenderness to the right shoulder.  No pain along the clavicle scapula or AC joint.  Unable to range the shoulder without obvious bruit.  There is some dystonia  Skin:    Coloration: Skin is not pale.     Findings: No rash.  Neurological:     Mental Status: He is alert and  oriented to person, place, and time.  Psychiatric:        Behavior: Behavior normal.     (all labs ordered are listed, but only abnormal results are displayed) Labs Reviewed - No data to display  EKG: None  Radiology: DG Shoulder Right Result Date: 10/27/2023 CLINICAL DATA:  Shoulder pain after a fall. EXAM: RIGHT SHOULDER - 2+ VIEW COMPARISON:  04/01/2021 FINDINGS: No evidence of an acute fracture. No shoulder separation or dislocation. Minimal degenerative changes seen at the glenoid and at the rotator cuff insertion. Healed posterior right sixth rib fracture evident. IMPRESSION: 1. No acute bony findings. 2. Minimal degenerative changes at the glenoid and rotator cuff insertion. Electronically Signed   By: Camellia Candle M.D.   On: 10/27/2023 09:43   CT Head Wo Contrast Result Date: 10/27/2023 CLINICAL DATA:  Head trauma, minor (Age >= 65y). Fell in bathroom and hit head on toilet. History of Parkinson's disease. EXAM: CT HEAD WITHOUT CONTRAST TECHNIQUE: Contiguous axial images were obtained from the base of the skull through the vertex without intravenous contrast. RADIATION DOSE REDUCTION: This exam was performed according to the departmental dose-optimization program which includes automated exposure control, adjustment of the mA and/or kV according to patient size and/or use of iterative reconstruction technique. COMPARISON:  Head CT 12/08/2021 FINDINGS: Brain: There is no evidence of an acute infarct, intracranial hemorrhage, mass, midline shift, or extra-axial fluid collection. Bilateral deep brain stimulators are unchanged and terminate in the subthalamic regions. There is mild cerebral atrophy. Vascular: Calcified atherosclerosis at the skull base. No hyperdense vessel. Skull: No fracture or suspicious lesion. Sinuses/Orbits: Visualized paranasal sinuses and mastoid air cells are well aerated. Bilateral cataract extraction. Other: None. IMPRESSION: No evidence of acute intracranial  abnormality. Electronically Signed   By: Dasie Hamburg M.D.   On: 10/27/2023 09:40     .Laceration Repair  Date/Time: 10/27/2023 9:57 AM  Performed by: Emil Share, DO Authorized by: Emil Share, DO   Consent:    Consent obtained:  Verbal   Consent given by:  Patient   Risks, benefits, and alternatives were discussed: yes     Risks discussed:  Infection, pain, poor cosmetic result and poor wound healing   Alternatives discussed:  No treatment Universal protocol:    Procedure explained and questions answered to patient or proxy's satisfaction: yes     Imaging studies available: yes (CT)     Immediately prior to procedure, a time out was called: yes  Patient identity confirmed:  Verbally with patient Laceration details:    Location:  Face   Face location:  L eyebrow   Length (cm):  8.2 Pre-procedure details:    Preparation:  Patient was prepped and draped in usual sterile fashion Exploration:    Hemostasis achieved with:  Epinephrine  and direct pressure   Imaging obtained comment:  CT   Imaging outcome: foreign body not noted     Wound exploration: entire depth of wound visualized     Wound extent: no underlying fracture   Treatment:    Area cleansed with:  Chlorhexidine   Amount of cleaning:  Standard   Irrigation solution:  Sterile saline   Irrigation volume:  150   Irrigation method:  Pressure wash   Debridement:  None   Undermining:  None   Scar revision: no   Skin repair:    Repair method:  Sutures   Suture size:  5-0   Suture material:  Fast-absorbing gut   Suture technique:  Simple interrupted   Number of sutures:  8 Approximation:    Approximation:  Close Repair type:    Repair type:  Simple Post-procedure details:    Dressing:  Antibiotic ointment and adhesive bandage   Procedure completion:  Tolerated well, no immediate complications    Medications Ordered in the ED  lidocaine -EPINEPHrine  (XYLOCAINE  W/EPI) 2 %-1:200000 (PF) injection 10 mL (10 mLs  Intradermal Given 10/27/23 0856)                                    Medical Decision Making Amount and/or Complexity of Data Reviewed Radiology: ordered.  Risk Prescription drug management.   78 yo M with a chief complaints of a fall.  Nonsyncopal by history.  Majority of the history is provided by the wife as the patient has Parkinson's and has some issues typically after taking his morning Parkinson's meds for her.  Complaining of laceration to his head as well as right shoulder pain.  Will obtain CT imaging of the head.  Plain film of the right shoulder.  Repair wound at bedside.  CT imaging on my independent interpretation without obvious intracranial hemorrhage.  Plain film of the right shoulder on my independent interpretation without fracture or dislocation.  I discussed the results with patient and family.  Will discharge home.  PCP follow-up.  9:59 AM:  I have discussed the diagnosis/risks/treatment options with the patient and family.  Evaluation and diagnostic testing in the emergency department does not suggest an emergent condition requiring admission or immediate intervention beyond what has been performed at this time.  They will follow up with PCP. We also discussed returning to the ED immediately if new or worsening sx occur. We discussed the sx which are most concerning (e.g., sudden worsening pain, fever, inability to tolerate by mouth, headache, confusion, redness, drainage) that necessitate immediate return. Medications administered to the patient during their visit and any new prescriptions provided to the patient are listed below.  Medications given during this visit Medications  lidocaine -EPINEPHrine  (XYLOCAINE  W/EPI) 2 %-1:200000 (PF) injection 10 mL (10 mLs Intradermal Given 10/27/23 0856)     The patient appears reasonably screen and/or stabilized for discharge and I doubt any other medical condition or other [facility] requiring further screening, evaluation, or  treatment in the ED at this time prior to discharge.       Final diagnoses:  Fall, initial encounter  Laceration  of left eyebrow, initial encounter    ED Discharge Orders     None          Emil Share, [place] 10/27/23 [phone]

## 2023-10-27 NOTE — ED Notes (Signed)
 Wound irrigated and cleansed with wound cleanser. Covered with saline soaked 4x4. Supplies for provider at bedside.

## 2023-10-27 NOTE — Discharge Instructions (Signed)
Return for redness drainage or if you get a fever.  The sutures that were used are dissolvable that should dissolve between day 3 and day 5.  If they are still there after day 7 then you can gently plucked them out with tweezers.  The area can get wet but not fully immersed underwater.  No scrubbing.  If you really want to clean it you can apply a half-and-half hydrogen peroxide solution with water on a Q-tip.  You can apply an ointment a couple times a day this could be as simple as Vaseline but could also be an antibiotic ointment if you wish.  Once it is healed please try to avoid prolonged sun exposure use sunscreen.  Gells that have silicone antigens have been shown to reduce scarring in some research.

## 2023-11-03 DIAGNOSIS — N401 Enlarged prostate with lower urinary tract symptoms: Secondary | ICD-10-CM | POA: Diagnosis not present

## 2023-11-03 DIAGNOSIS — E559 Vitamin D deficiency, unspecified: Secondary | ICD-10-CM | POA: Diagnosis not present

## 2023-11-03 DIAGNOSIS — E785 Hyperlipidemia, unspecified: Secondary | ICD-10-CM | POA: Diagnosis not present

## 2023-11-03 DIAGNOSIS — Z1212 Encounter for screening for malignant neoplasm of rectum: Secondary | ICD-10-CM | POA: Diagnosis not present

## 2023-11-03 DIAGNOSIS — I2510 Atherosclerotic heart disease of native coronary artery without angina pectoris: Secondary | ICD-10-CM | POA: Diagnosis not present

## 2023-11-04 DIAGNOSIS — N3946 Mixed incontinence: Secondary | ICD-10-CM | POA: Diagnosis not present

## 2023-11-04 DIAGNOSIS — R300 Dysuria: Secondary | ICD-10-CM | POA: Diagnosis not present

## 2023-11-04 DIAGNOSIS — N401 Enlarged prostate with lower urinary tract symptoms: Secondary | ICD-10-CM | POA: Diagnosis not present

## 2023-11-04 DIAGNOSIS — R8271 Bacteriuria: Secondary | ICD-10-CM | POA: Diagnosis not present

## 2023-11-05 DIAGNOSIS — E785 Hyperlipidemia, unspecified: Secondary | ICD-10-CM | POA: Diagnosis not present

## 2023-11-05 DIAGNOSIS — R82998 Other abnormal findings in urine: Secondary | ICD-10-CM | POA: Diagnosis not present

## 2023-11-05 DIAGNOSIS — Z1212 Encounter for screening for malignant neoplasm of rectum: Secondary | ICD-10-CM | POA: Diagnosis not present

## 2023-11-08 DIAGNOSIS — I2510 Atherosclerotic heart disease of native coronary artery without angina pectoris: Secondary | ICD-10-CM | POA: Diagnosis not present

## 2023-11-08 DIAGNOSIS — G20A1 Parkinson's disease without dyskinesia, without mention of fluctuations: Secondary | ICD-10-CM | POA: Diagnosis not present

## 2023-11-08 DIAGNOSIS — G4733 Obstructive sleep apnea (adult) (pediatric): Secondary | ICD-10-CM | POA: Diagnosis not present

## 2023-11-08 DIAGNOSIS — Z832 Family history of diseases of the blood and blood-forming organs and certain disorders involving the immune mechanism: Secondary | ICD-10-CM | POA: Diagnosis not present

## 2023-11-08 DIAGNOSIS — N401 Enlarged prostate with lower urinary tract symptoms: Secondary | ICD-10-CM | POA: Diagnosis not present

## 2023-11-08 DIAGNOSIS — C801 Malignant (primary) neoplasm, unspecified: Secondary | ICD-10-CM | POA: Diagnosis not present

## 2023-11-08 DIAGNOSIS — I83029 Varicose veins of left lower extremity with ulcer of unspecified site: Secondary | ICD-10-CM | POA: Diagnosis not present

## 2023-11-08 DIAGNOSIS — E785 Hyperlipidemia, unspecified: Secondary | ICD-10-CM | POA: Diagnosis not present

## 2023-11-08 DIAGNOSIS — M25551 Pain in right hip: Secondary | ICD-10-CM | POA: Diagnosis not present

## 2023-11-08 DIAGNOSIS — H9190 Unspecified hearing loss, unspecified ear: Secondary | ICD-10-CM | POA: Diagnosis not present

## 2023-11-08 DIAGNOSIS — M8589 Other specified disorders of bone density and structure, multiple sites: Secondary | ICD-10-CM | POA: Diagnosis not present

## 2023-11-08 DIAGNOSIS — Z0000 Encounter for general adult medical examination without abnormal findings: Secondary | ICD-10-CM | POA: Diagnosis not present

## 2023-11-08 DIAGNOSIS — D696 Thrombocytopenia, unspecified: Secondary | ICD-10-CM | POA: Diagnosis not present

## 2023-11-22 DIAGNOSIS — M25532 Pain in left wrist: Secondary | ICD-10-CM | POA: Diagnosis not present

## 2023-11-23 DIAGNOSIS — D692 Other nonthrombocytopenic purpura: Secondary | ICD-10-CM | POA: Diagnosis not present

## 2023-11-23 DIAGNOSIS — L565 Disseminated superficial actinic porokeratosis (DSAP): Secondary | ICD-10-CM | POA: Diagnosis not present

## 2023-11-23 DIAGNOSIS — L814 Other melanin hyperpigmentation: Secondary | ICD-10-CM | POA: Diagnosis not present

## 2023-11-23 DIAGNOSIS — Z85828 Personal history of other malignant neoplasm of skin: Secondary | ICD-10-CM | POA: Diagnosis not present

## 2023-11-29 DIAGNOSIS — M25532 Pain in left wrist: Secondary | ICD-10-CM | POA: Diagnosis not present

## 2023-11-29 DIAGNOSIS — G5630 Lesion of radial nerve, unspecified upper limb: Secondary | ICD-10-CM | POA: Diagnosis not present

## 2023-12-01 DIAGNOSIS — K117 Disturbances of salivary secretion: Secondary | ICD-10-CM | POA: Diagnosis not present

## 2023-12-01 DIAGNOSIS — G243 Spasmodic torticollis: Secondary | ICD-10-CM | POA: Diagnosis not present

## 2023-12-01 DIAGNOSIS — G245 Blepharospasm: Secondary | ICD-10-CM | POA: Diagnosis not present

## 2023-12-09 DIAGNOSIS — R29898 Other symptoms and signs involving the musculoskeletal system: Secondary | ICD-10-CM | POA: Diagnosis not present

## 2023-12-13 DIAGNOSIS — R351 Nocturia: Secondary | ICD-10-CM | POA: Diagnosis not present

## 2023-12-13 DIAGNOSIS — H524 Presbyopia: Secondary | ICD-10-CM | POA: Diagnosis not present

## 2023-12-13 DIAGNOSIS — N401 Enlarged prostate with lower urinary tract symptoms: Secondary | ICD-10-CM | POA: Diagnosis not present

## 2023-12-13 DIAGNOSIS — H52203 Unspecified astigmatism, bilateral: Secondary | ICD-10-CM | POA: Diagnosis not present

## 2023-12-13 DIAGNOSIS — Z961 Presence of intraocular lens: Secondary | ICD-10-CM | POA: Diagnosis not present

## 2023-12-14 ENCOUNTER — Telehealth: Payer: Self-pay | Admitting: Neurology

## 2023-12-14 NOTE — Telephone Encounter (Signed)
 LVM and sent mychart msg informing pt of need to reschedule 12/16/23 appt - MD out

## 2023-12-16 ENCOUNTER — Ambulatory Visit: Admitting: Neurology

## 2023-12-22 DIAGNOSIS — R29898 Other symptoms and signs involving the musculoskeletal system: Secondary | ICD-10-CM | POA: Diagnosis not present

## 2023-12-23 DIAGNOSIS — Z23 Encounter for immunization: Secondary | ICD-10-CM | POA: Diagnosis not present

## 2024-01-24 ENCOUNTER — Other Ambulatory Visit: Payer: Self-pay | Admitting: Neurology

## 2024-02-08 ENCOUNTER — Ambulatory Visit: Admitting: Neurology

## 2024-02-10 ENCOUNTER — Encounter (HOSPITAL_COMMUNITY)

## 2024-02-12 ENCOUNTER — Other Ambulatory Visit: Payer: Self-pay | Admitting: Neurology

## 2024-02-12 DIAGNOSIS — G20B2 Parkinson's disease with dyskinesia, with fluctuations: Secondary | ICD-10-CM

## 2024-02-16 ENCOUNTER — Other Ambulatory Visit: Payer: Self-pay | Admitting: Neurology

## 2024-02-23 NOTE — Telephone Encounter (Signed)
 Last seen 06/10/23, next appt 06/01/24  Dispenses   Dispensed Days Supply Quantity Provider Pharmacy  clonazepam  0.5 mg tablet 11/20/2023 90 90 each Buck Saucer, MD [facility]  clonazepam  0.5 mg tablet 08/23/2023 90 90 each Buck Saucer, MD [facility]  clonazepam  0.5 mg tablet 05/25/2023 90 90 each Buck Saucer, MD [facility]

## 2024-03-14 DIAGNOSIS — N390 Urinary tract infection, site not specified: Secondary | ICD-10-CM | POA: Diagnosis not present

## 2024-04-03 ENCOUNTER — Encounter: Payer: Self-pay | Admitting: Neurology

## 2024-04-03 DIAGNOSIS — G20B2 Parkinson's disease with dyskinesia, with fluctuations: Secondary | ICD-10-CM

## 2024-04-03 DIAGNOSIS — G479 Sleep disorder, unspecified: Secondary | ICD-10-CM

## 2024-04-03 DIAGNOSIS — G4752 REM sleep behavior disorder: Secondary | ICD-10-CM

## 2024-04-03 DIAGNOSIS — G4761 Periodic limb movement disorder: Secondary | ICD-10-CM

## 2024-04-03 DIAGNOSIS — G4730 Sleep apnea, unspecified: Secondary | ICD-10-CM

## 2024-04-03 NOTE — Telephone Encounter (Signed)
 Please see MyChart message.  Sleep study ordered.

## 2024-05-02 ENCOUNTER — Ambulatory Visit: Admitting: Neurology

## 2024-05-07 ENCOUNTER — Encounter

## 2024-05-09 ENCOUNTER — Ambulatory Visit: Admitting: Neurology

## 2024-05-12 ENCOUNTER — Other Ambulatory Visit (HOSPITAL_COMMUNITY): Payer: Self-pay | Admitting: Internal Medicine

## 2024-05-12 ENCOUNTER — Encounter (HOSPITAL_COMMUNITY): Payer: Self-pay | Admitting: Internal Medicine

## 2024-05-12 DIAGNOSIS — I2510 Atherosclerotic heart disease of native coronary artery without angina pectoris: Secondary | ICD-10-CM | POA: Insufficient documentation

## 2024-05-12 NOTE — Progress Notes (Signed)
 Received Leqvio  orders. Patient's last dose was in 2024. He will need to reload treatment  Sherry Pennant, PharmD, MPH, BCPS, CPP Clinical Pharmacist

## 2024-05-18 ENCOUNTER — Encounter

## 2024-05-25 ENCOUNTER — Ambulatory Visit: Admitting: Neurology

## 2024-06-01 ENCOUNTER — Ambulatory Visit: Admitting: Neurology

## 2024-06-05 ENCOUNTER — Encounter
